# Patient Record
Sex: Male | Born: 1940
Health system: Southern US, Community
[De-identification: ages and names within clinical notes are randomized; demographics above are authoritative.]

## PROBLEM LIST (undated history)

## (undated) DIAGNOSIS — M1990 Unspecified osteoarthritis, unspecified site: Secondary | ICD-10-CM

## (undated) DIAGNOSIS — T8859XA Other complications of anesthesia, initial encounter: Secondary | ICD-10-CM

## (undated) DIAGNOSIS — J449 Chronic obstructive pulmonary disease, unspecified: Secondary | ICD-10-CM

## (undated) DIAGNOSIS — H3530 Unspecified macular degeneration: Secondary | ICD-10-CM

## (undated) DIAGNOSIS — G620 Drug-induced polyneuropathy: Secondary | ICD-10-CM

## (undated) DIAGNOSIS — Z87891 Personal history of nicotine dependence: Secondary | ICD-10-CM

## (undated) DIAGNOSIS — I499 Cardiac arrhythmia, unspecified: Secondary | ICD-10-CM

## (undated) DIAGNOSIS — T4145XA Adverse effect of unspecified anesthetic, initial encounter: Secondary | ICD-10-CM

## (undated) DIAGNOSIS — D649 Anemia, unspecified: Secondary | ICD-10-CM

## (undated) DIAGNOSIS — F1011 Alcohol abuse, in remission: Secondary | ICD-10-CM

## (undated) DIAGNOSIS — N400 Enlarged prostate without lower urinary tract symptoms: Secondary | ICD-10-CM

## (undated) DIAGNOSIS — I2510 Atherosclerotic heart disease of native coronary artery without angina pectoris: Secondary | ICD-10-CM

## (undated) DIAGNOSIS — K219 Gastro-esophageal reflux disease without esophagitis: Secondary | ICD-10-CM

## (undated) DIAGNOSIS — D701 Agranulocytosis secondary to cancer chemotherapy: Secondary | ICD-10-CM

## (undated) DIAGNOSIS — R918 Other nonspecific abnormal finding of lung field: Secondary | ICD-10-CM

## (undated) DIAGNOSIS — D126 Benign neoplasm of colon, unspecified: Secondary | ICD-10-CM

## (undated) DIAGNOSIS — M545 Low back pain, unspecified: Secondary | ICD-10-CM

## (undated) DIAGNOSIS — R7303 Prediabetes: Secondary | ICD-10-CM

## (undated) DIAGNOSIS — H269 Unspecified cataract: Secondary | ICD-10-CM

## (undated) DIAGNOSIS — T451X5A Adverse effect of antineoplastic and immunosuppressive drugs, initial encounter: Secondary | ICD-10-CM

## (undated) DIAGNOSIS — K635 Polyp of colon: Secondary | ICD-10-CM

## (undated) DIAGNOSIS — Z87442 Personal history of urinary calculi: Secondary | ICD-10-CM

## (undated) DIAGNOSIS — G709 Myoneural disorder, unspecified: Secondary | ICD-10-CM

## (undated) DIAGNOSIS — IMO0001 Reserved for inherently not codable concepts without codable children: Secondary | ICD-10-CM

## (undated) DIAGNOSIS — C3490 Malignant neoplasm of unspecified part of unspecified bronchus or lung: Secondary | ICD-10-CM

## (undated) DIAGNOSIS — I10 Essential (primary) hypertension: Secondary | ICD-10-CM

## (undated) DIAGNOSIS — C679 Malignant neoplasm of bladder, unspecified: Secondary | ICD-10-CM

## (undated) DIAGNOSIS — J95811 Postprocedural pneumothorax: Secondary | ICD-10-CM

## (undated) DIAGNOSIS — E119 Type 2 diabetes mellitus without complications: Secondary | ICD-10-CM

## (undated) DIAGNOSIS — E785 Hyperlipidemia, unspecified: Secondary | ICD-10-CM

## (undated) DIAGNOSIS — G250 Essential tremor: Secondary | ICD-10-CM

## (undated) DIAGNOSIS — G8929 Other chronic pain: Secondary | ICD-10-CM

## (undated) HISTORY — DX: Adverse effect of antineoplastic and immunosuppressive drugs, initial encounter: T45.1X5A

## (undated) HISTORY — DX: Other chronic pain: G89.29

## (undated) HISTORY — DX: Malignant neoplasm of unspecified part of unspecified bronchus or lung: C34.90

## (undated) HISTORY — DX: Low back pain, unspecified: M54.50

## (undated) HISTORY — DX: Essential tremor: G25.0

## (undated) HISTORY — DX: Postprocedural pneumothorax: J95.811

## (undated) HISTORY — PX: LUNG BIOPSY: SHX232

## (undated) HISTORY — DX: Personal history of nicotine dependence: Z87.891

## (undated) HISTORY — DX: Benign neoplasm of colon, unspecified: D12.6

## (undated) HISTORY — DX: Agranulocytosis secondary to cancer chemotherapy: D70.1

## (undated) HISTORY — DX: Unspecified cataract: H26.9

## (undated) HISTORY — DX: Malignant neoplasm of bladder, unspecified: C67.9

## (undated) HISTORY — DX: Anemia, unspecified: D64.9

## (undated) HISTORY — DX: Other nonspecific abnormal finding of lung field: R91.8

## (undated) HISTORY — DX: Alcohol abuse, in remission: F10.11

## (undated) HISTORY — DX: Benign prostatic hyperplasia without lower urinary tract symptoms: N40.0

## (undated) HISTORY — DX: Polyp of colon: K63.5

## (undated) HISTORY — DX: Unspecified macular degeneration: H35.30

## (undated) HISTORY — DX: Hyperlipidemia, unspecified: E78.5

## (undated) HISTORY — DX: Drug-induced polyneuropathy: G62.0

## (undated) HISTORY — PX: CATARACT EXTRACTION: SUR2

## (undated) HISTORY — DX: Essential (primary) hypertension: I10

## (undated) HISTORY — PX: POLYPECTOMY: SHX149

## (undated) HISTORY — DX: Low back pain: M54.5

## (undated) HISTORY — DX: Cardiac arrhythmia, unspecified: I49.9

## (undated) SURGERY — Surgical Case
Anesthesia: *Unknown

---

## 1962-06-11 HISTORY — PX: TONSILLECTOMY: SUR1361

## 1980-06-11 HISTORY — PX: HEMORROIDECTOMY: SUR656

## 2010-03-11 DIAGNOSIS — I499 Cardiac arrhythmia, unspecified: Secondary | ICD-10-CM

## 2010-03-11 HISTORY — PX: OTHER SURGICAL HISTORY: SHX169

## 2010-03-11 HISTORY — DX: Cardiac arrhythmia, unspecified: I49.9

## 2010-03-11 HISTORY — PX: A FLUTTER ABLATION: SHX5348

## 2010-03-15 LAB — TSH: TSH: 1.07 u[IU]/mL (ref 0.41–5.90)

## 2010-08-08 LAB — LIPID PANEL
Cholesterol, Total: 166
Direct LDL: 105
HDL: 43 mg/dL (ref 35–70)
Triglyceride fasting, serum: 94

## 2010-08-08 LAB — HEMOGLOBIN A1C: A1c: 6

## 2010-08-08 LAB — COMPREHENSIVE METABOLIC PANEL
AST: 21 U/L
BUN: 25 mg/dL — AB (ref 4–21)
Creat: 0.68
Glucose: 110
Sodium: 144 mmol/L (ref 137–147)

## 2011-04-26 ENCOUNTER — Encounter: Payer: Self-pay | Admitting: Family Medicine

## 2011-04-26 ENCOUNTER — Ambulatory Visit (INDEPENDENT_AMBULATORY_CARE_PROVIDER_SITE_OTHER): Payer: Medicare Other | Admitting: Family Medicine

## 2011-04-26 ENCOUNTER — Telehealth: Payer: Self-pay | Admitting: *Deleted

## 2011-04-26 DIAGNOSIS — Z860100 Personal history of colon polyps, unspecified: Secondary | ICD-10-CM | POA: Insufficient documentation

## 2011-04-26 DIAGNOSIS — IMO0001 Reserved for inherently not codable concepts without codable children: Secondary | ICD-10-CM

## 2011-04-26 DIAGNOSIS — Z125 Encounter for screening for malignant neoplasm of prostate: Secondary | ICD-10-CM

## 2011-04-26 DIAGNOSIS — K635 Polyp of colon: Secondary | ICD-10-CM

## 2011-04-26 DIAGNOSIS — D126 Benign neoplasm of colon, unspecified: Secondary | ICD-10-CM

## 2011-04-26 DIAGNOSIS — I499 Cardiac arrhythmia, unspecified: Secondary | ICD-10-CM

## 2011-04-26 DIAGNOSIS — Z87891 Personal history of nicotine dependence: Secondary | ICD-10-CM | POA: Insufficient documentation

## 2011-04-26 DIAGNOSIS — F17200 Nicotine dependence, unspecified, uncomplicated: Secondary | ICD-10-CM

## 2011-04-26 DIAGNOSIS — F1011 Alcohol abuse, in remission: Secondary | ICD-10-CM

## 2011-04-26 DIAGNOSIS — I10 Essential (primary) hypertension: Secondary | ICD-10-CM

## 2011-04-26 MED ORDER — METOPROLOL TARTRATE 25 MG PO TABS: 25.0000 mg | ORAL_TABLET | Freq: Two times a day (BID) | ORAL | Status: DC

## 2011-04-26 MED ORDER — LISINOPRIL 10 MG PO TABS: 10.0000 mg | ORAL_TABLET | Freq: Every day | ORAL | Status: DC

## 2011-04-26 MED ORDER — METOPROLOL TARTRATE 25 MG PO TABS: 12.5000 mg | ORAL_TABLET | Freq: Two times a day (BID) | ORAL | Status: DC

## 2011-04-26 MED ORDER — VARENICLINE TARTRATE 0.5 MG X 11 & 1 MG X 42 PO MISC: ORAL | Status: AC

## 2011-04-26 MED ORDER — VARENICLINE TARTRATE 1 MG PO TABS: 1.0000 mg | ORAL_TABLET | Freq: Two times a day (BID) | ORAL | Status: AC

## 2011-04-26 NOTE — Assessment & Plan Note (Signed)
See above. Continue meds for now.

## 2011-04-26 NOTE — Patient Instructions (Addendum)
I've sent medicines to pharmacy ([facility]). Start chantix at your convenience - watch out for nausea if that happens let me know.  Look at [email or web address]. I'll await records. Good to meet you today call us with questions. Come back at your convenience - next few months for physical. Come in a few days prior fasting for blood work. We will try lower dose of metoprolol.  Monitor heart rate.

## 2011-04-26 NOTE — Assessment & Plan Note (Signed)
Compliant with meds.  Refilled today. Pt requests change to metoprolol tartrate for cheaper and insurance coverage As HR 60 and pt states runs lower, will decrease metoprolol to 12.5mg  bid. May consider coming off.

## 2011-04-26 NOTE — Progress Notes (Signed)
Subjective:    Patient ID: Joseph Graham, male    DOB: Oct 21, 1940, 70 y.o.   MRN: [id number]  HPI CC: new pt establish  No concerns today.  Recently moved from [place] 2 [place] ago.  H/o cardiac ablation 2011.  Told no blockages.  Wonders if needs B blocker.  Just changed insurance - would like switch from met succ to tartrate.  Smoking - 8 cig/day.  currently using nicontine gum.  Has tried patches in past.  Didn't do well with zyban.  Would like to try chantix.  HTN - compliant with meds.  Didn't do well with zyban.  H/o alcohol abuse, in remission x 30 years.  Avoids narcotis - tends to become dependent easily.  Goes to gym 3x/wk.  Gets winded when exherting himself (ie treadmill).  Pulse does run low.  Preventative: Flu - declines Tetanus - unsure. Pneumonia - 2012 CPE - unsure, blood work 4 mo ago. Colonoscopy - 2005, had rpt scheduled but moved here.  Needs rpt as had polyp. Prostate - always normal.  Occasional frequency.  No nocturia, strong stream. Pt filled ROI form for me to get records.  Medications and allergies reviewed and updated in chart.  Past histories reviewed and updated if relevant as below. There is no problem list on file for this patient.  Past Medical History  Diagnosis Date  . Colon polyps   . HTN (hypertension)   . Alcohol abuse, in remission     remote  . Smoking     8 cig/day   Past Surgical History  Procedure Date  . Tonsillectomy 1964  . Hemorroidectomy 1982  . Cardiac electrophysiology study and ablation 03/2010   History  Substance Use Topics  . Smoking status: Current Everyday Smoker -- 0.5 packs/day    Types: Cigarettes  . Smokeless tobacco: Never Used  . Alcohol Use: No   Family History  Problem Relation Age of Onset  . Cancer Mother 30    ovarian  . Hypertension Father   . Coronary artery disease Neg Hx   . Stroke Neg Hx   . Diabetes Neg Hx   . Parkinsonism Father    No Known Allergies No current outpatient prescriptions on  file prior to visit.   Review of Systems  Constitutional: Negative for fever, chills, activity change, appetite change, fatigue and unexpected weight change.  HENT: Negative for hearing loss and neck pain.   Eyes: Negative for visual disturbance.  Respiratory: Negative for cough, chest tightness, shortness of breath and wheezing.   Cardiovascular: Negative for chest pain, palpitations and leg swelling.  Gastrointestinal: Negative for nausea, vomiting, abdominal pain, diarrhea, constipation, blood in stool and abdominal distention.  Genitourinary: Negative for hematuria and difficulty urinating.  Musculoskeletal: Negative for myalgias and arthralgias.  Skin: Negative for rash.  Neurological: Negative for dizziness, seizures, syncope and headaches.  Hematological: Does not bruise/bleed easily.  Psychiatric/Behavioral: Negative for dysphoric mood. The patient is not nervous/anxious.        Objective:   Physical Exam  Nursing note and vitals reviewed. Constitutional: He is oriented to person, place, and time. He appears well-developed and well-nourished. No distress.  HENT:  Head: Normocephalic and atraumatic.  Right Ear: Hearing, tympanic membrane, external ear and ear canal normal.  Left Ear: Hearing, tympanic membrane, external ear and ear canal normal.  Nose: Nose normal. No mucosal edema or rhinorrhea.  Mouth/Throat: Uvula is midline, oropharynx is clear and moist and mucous membranes are normal. No oropharyngeal exudate, posterior oropharyngeal edema,  posterior oropharyngeal erythema or tonsillar abscesses.  Eyes: Conjunctivae and EOM are normal. Pupils are equal, round, and reactive to light. No scleral icterus.  Neck: Normal range of motion. Neck supple. No thyromegaly present.  Cardiovascular: Normal rate, regular rhythm, normal heart sounds and intact distal pulses.   No murmur heard. Pulses:      Radial pulses are 2+ on the right side, and 2+ on the left side.    Pulmonary/Chest: Effort normal and breath sounds normal. No respiratory distress. He has no wheezes. He has no rales.  Abdominal: Soft. Bowel sounds are normal. He exhibits no distension and no mass. There is no tenderness. There is no rebound and no guarding.  Musculoskeletal: Normal range of motion.  Lymphadenopathy:    He has no cervical adenopathy.  Neurological: He is alert and oriented to person, place, and time.       CN grossly intact, station and gait intact  Skin: Skin is warm and dry. No rash noted.  Psychiatric: He has a normal mood and affect. His behavior is normal. Judgment and thought content normal.      Assessment & Plan:

## 2011-04-26 NOTE — Telephone Encounter (Signed)
Pharmacy notified.

## 2011-04-26 NOTE — Assessment & Plan Note (Signed)
Action phase.   Interested in chantix, prescribed this. discussed common side effects including nausea/vomiting, vivid dreams, aggression/irritation, and cv risk. Discussed set quit date 1 wk into script. Pt will start in next few weeks with wife.   Provided with quitlinenc website.

## 2011-04-26 NOTE — Assessment & Plan Note (Signed)
Stable. Will avoid narcotics as well.

## 2011-04-26 NOTE — Assessment & Plan Note (Addendum)
will need to schedule rpt as due. Return for CPE beginning of next year.  Await prior PCP records.

## 2011-04-26 NOTE — Telephone Encounter (Signed)
Please clarify 12.5mg  bid.

## 2011-04-26 NOTE — Telephone Encounter (Signed)
[facility] that were sent in today.  One was sent in for 25 mg's bid and the other for 25 mg's, one half bid.  Please clarify.

## 2011-05-27 ENCOUNTER — Encounter: Payer: Self-pay | Admitting: Family Medicine

## 2011-06-13 ENCOUNTER — Encounter: Payer: Self-pay | Admitting: Family Medicine

## 2011-07-26 ENCOUNTER — Other Ambulatory Visit (INDEPENDENT_AMBULATORY_CARE_PROVIDER_SITE_OTHER): Payer: Medicare Other

## 2011-07-26 DIAGNOSIS — I10 Essential (primary) hypertension: Secondary | ICD-10-CM

## 2011-07-26 DIAGNOSIS — F1011 Alcohol abuse, in remission: Secondary | ICD-10-CM

## 2011-07-26 DIAGNOSIS — Z125 Encounter for screening for malignant neoplasm of prostate: Secondary | ICD-10-CM

## 2011-07-26 LAB — COMPREHENSIVE METABOLIC PANEL
Albumin: 3.9 g/dL (ref 3.5–5.2)
Alkaline Phosphatase: 81 U/L (ref 39–117)
BUN: 25 mg/dL — ABNORMAL HIGH (ref 6–23)
CO2: 29 mEq/L (ref 19–32)
Calcium: 8.8 mg/dL (ref 8.4–10.5)
Chloride: 103 mEq/L (ref 96–112)
Glucose, Bld: 99 mg/dL (ref 70–99)
Potassium: 4.1 mEq/L (ref 3.5–5.1)
Sodium: 139 mEq/L (ref 135–145)
Total Protein: 7.5 g/dL (ref 6.0–8.3)

## 2011-07-26 LAB — CBC WITH DIFFERENTIAL/PLATELET
Basophils Relative: 0.8 % (ref 0.0–3.0)
Eosinophils Relative: 2.9 % (ref 0.0–5.0)
Lymphocytes Relative: 34.9 % (ref 12.0–46.0)
Monocytes Absolute: 0.6 10*3/uL (ref 0.1–1.0)
Monocytes Relative: 8.9 % (ref 3.0–12.0)
Neutrophils Relative %: 52.5 % (ref 43.0–77.0)
Platelets: 208 10*3/uL (ref 150.0–400.0)
RBC: 4.33 Mil/uL (ref 4.22–5.81)
WBC: 6.8 10*3/uL (ref 4.5–10.5)

## 2011-07-26 LAB — TSH: TSH: 0.78 u[IU]/mL (ref 0.35–5.50)

## 2011-07-26 LAB — LIPID PANEL
Cholesterol: 157 mg/dL (ref 0–200)
LDL Cholesterol: 99 mg/dL (ref 0–99)
Triglycerides: 57 mg/dL (ref 0.0–149.0)

## 2011-07-27 LAB — PSA, MEDICARE: PSA: 0.91 ng/mL (ref <=4.00)

## 2011-07-31 ENCOUNTER — Ambulatory Visit (INDEPENDENT_AMBULATORY_CARE_PROVIDER_SITE_OTHER): Payer: Medicare Other | Admitting: Family Medicine

## 2011-07-31 ENCOUNTER — Encounter: Payer: Self-pay | Admitting: Family Medicine

## 2011-07-31 ENCOUNTER — Encounter: Payer: Self-pay | Admitting: Internal Medicine

## 2011-07-31 VITALS — BP 138/74 | HR 66 | Temp 97.9°F | Ht 69.0 in | Wt 192.2 lb

## 2011-07-31 DIAGNOSIS — G250 Essential tremor: Secondary | ICD-10-CM | POA: Insufficient documentation

## 2011-07-31 DIAGNOSIS — R195 Other fecal abnormalities: Secondary | ICD-10-CM

## 2011-07-31 DIAGNOSIS — Z87891 Personal history of nicotine dependence: Secondary | ICD-10-CM

## 2011-07-31 DIAGNOSIS — Z0000 Encounter for general adult medical examination without abnormal findings: Secondary | ICD-10-CM | POA: Insufficient documentation

## 2011-07-31 DIAGNOSIS — K635 Polyp of colon: Secondary | ICD-10-CM

## 2011-07-31 DIAGNOSIS — Z125 Encounter for screening for malignant neoplasm of prostate: Secondary | ICD-10-CM

## 2011-07-31 DIAGNOSIS — Z1211 Encounter for screening for malignant neoplasm of colon: Secondary | ICD-10-CM

## 2011-07-31 DIAGNOSIS — Z23 Encounter for immunization: Secondary | ICD-10-CM

## 2011-07-31 DIAGNOSIS — D126 Benign neoplasm of colon, unspecified: Secondary | ICD-10-CM

## 2011-07-31 LAB — POC HEMOCCULT BLD/STL (OFFICE/1-CARD/DIAGNOSTIC): Fecal Occult Blood, POC: POSITIVE

## 2011-07-31 NOTE — Assessment & Plan Note (Signed)
Maintenance phase. Congratulated!  Pt wants to come off chantix 2/2 vivid dreams.  Will drop to 1/2 pill bid for 1 wk then stop chantix. If urge returning, will restart (states "i can live with vivid dreams")

## 2011-07-31 NOTE — Progress Notes (Signed)
Subjective:    Patient ID: Joseph Graham, male    DOB: Apr 06, 1941, 71 y.o.   MRN: [id number]  HPI CC:  Medicare wellness visit  Pt presents today for annual exam.   No concerns today. Compliant with meds.  Quit smoking x 5 wks now!!  started chantix several months ago, off cigarettes completely.  Very vivid dreams.  Not using nicotine gum either.  Wants to come off chantix 2/2 impairing restful sleep.  Preventative:  Flu - declines. Tetanus - unsure.  would like today.  Td. Pneumonia - 2012. Shingles - would not like zostavax. States has not had chicken pox Colonoscopy - 2003 or 2005, had rpt scheduled but moved here. Needs rpt as had h/o polyps. Prostate - always normal.  Occasional frequency.  No nocturia, strong stream. No trouble with memory.  No trouble with hearing or vision.  Due for eye exam.  Advised to schedule. Would not want extended life support.  Does want CPR and life support if deemed reversible/temporary.  HCPOA is wife.  No falls in last year.   Denies depression.  No anhedonia.  Caffeine: 1 cup decaf/day Lives with wife Occupation: retired, worked Airline pilot Activity: gym 3x/wk, Publishing rights manager, hobbies (paint, building things) Diet: healthy, good water, fruits/vegetables daily, red meat 1x/wk  Medications and allergies reviewed and updated in chart.  Past histories reviewed and updated if relevant as below. Patient Active Problem List  Diagnoses  . Colon polyps  . HTN (hypertension)  . Alcohol abuse, in remission  . Smoking  . Cardiac arrhythmia   Past Medical History  Diagnosis Date  . Colon polyps     due for colonoscopy, last ?2003  . HTN (hypertension)   . Alcohol abuse, in remission     remote  . Smoking     quit 06/2011  . Cardiac arrhythmia 03/2010    h/o a flutter and CM, s/p ablation, normal stress test  . Benign essential tremor     improved on metoprolol  . Dyslipidemia     mild off meds  . Chronic low back pain     MRI 2004, multilevel DDD   . Prediabetes    Past Surgical History  Procedure Date  . Tonsillectomy 1964  . Hemorroidectomy 1982  . A flutter ablation 03/2010  . Cataract extraction   . Carotid US 03/2010    WNL   History  Substance Use Topics  . Smoking status: Current Everyday Smoker -- 0.5 packs/day    Types: Cigarettes  . Smokeless tobacco: Never Used  . Alcohol Use: No   Family History  Problem Relation Age of Onset  . Cancer Mother 68    ovarian  . Hypertension Father   . Coronary artery disease Neg Hx   . Stroke Neg Hx   . Diabetes Neg Hx   . Parkinsonism Father     and several uncles/aunts   No Known Allergies Current Outpatient Prescriptions on File Prior to Visit  Medication Sig Dispense Refill  . lisinopril (PRINIVIL,ZESTRIL) 10 MG tablet Take 1 tablet (10 mg total) by mouth daily.  90 tablet  3  . metoprolol tartrate (LOPRESSOR) 25 MG tablet Take 0.5 tablets (12.5 mg total) by mouth 2 (two) times daily.  90 tablet  3     Review of Systems  Constitutional: Positive for unexpected weight change (gained 5 lbs since quit smoking). Negative for fever, chills, activity change, appetite change and fatigue.  HENT: Negative for hearing loss and neck pain.  Eyes: Negative for visual disturbance.  Respiratory: Negative for cough, chest tightness, shortness of breath and wheezing.   Cardiovascular: Negative for chest pain, palpitations and leg swelling.  Gastrointestinal: Positive for diarrhea and constipation. Negative for nausea, vomiting, abdominal pain, blood in stool and abdominal distention.  Genitourinary: Negative for hematuria and difficulty urinating.  Musculoskeletal: Negative for myalgias and arthralgias.  Skin: Negative for rash.  Neurological: Negative for dizziness, seizures, syncope and headaches.  Hematological: Does not bruise/bleed easily.  Psychiatric/Behavioral: Negative for dysphoric mood. The patient is not nervous/anxious.        Objective:   Physical Exam    Nursing note and vitals reviewed. Constitutional: He is oriented to person, place, and time. He appears well-developed and well-nourished. No distress.  HENT:  Head: Normocephalic and atraumatic.  Right Ear: Hearing, tympanic membrane, external ear and ear canal normal.  Left Ear: Hearing, tympanic membrane, external ear and ear canal normal.  Nose: Nose normal. No mucosal edema or rhinorrhea.  Mouth/Throat: Uvula is midline, oropharynx is clear and moist and mucous membranes are normal. No oropharyngeal exudate, posterior oropharyngeal edema, posterior oropharyngeal erythema or tonsillar abscesses.       Sl irritation R external canal - wears ear plugs at night 2/2 wife's snoring + PNdrainage.  Eyes: Conjunctivae and EOM are normal. Pupils are equal, round, and reactive to light. No scleral icterus.  Neck: Normal range of motion. Neck supple. No thyromegaly present.  Cardiovascular: Normal rate, regular rhythm, normal heart sounds and intact distal pulses.   No murmur heard. Pulses:      Radial pulses are 2+ on the right side, and 2+ on the left side.  Pulmonary/Chest: Effort normal and breath sounds normal. No respiratory distress. He has no wheezes. He has no rales.  Abdominal: Soft. Bowel sounds are normal. He exhibits no distension and no mass. There is no tenderness. There is no rebound and no guarding.  Genitourinary: Prostate normal. Rectal exam shows no external hemorrhoid, no internal hemorrhoid, no fissure, no mass, no tenderness and anal tone normal. Guaiac positive stool. Prostate is not enlarged and not tender.       30-35gm prostate  Musculoskeletal: Normal range of motion.  Lymphadenopathy:    He has no cervical adenopathy.  Neurological: He is alert and oriented to person, place, and time.       CN grossly intact, station and gait intact  Skin: Skin is warm and dry. No rash noted.  Psychiatric: He has a normal mood and affect. His behavior is normal. Judgment and thought  content normal.      Assessment & Plan:

## 2011-07-31 NOTE — Assessment & Plan Note (Signed)
Hemoccult positive today, h/o polyps. Refer to GI for colonoscopy.

## 2011-07-31 NOTE — Assessment & Plan Note (Addendum)
I have personally reviewed the Medicare Annual Wellness questionnaire and have noted 1. The patient's medical and social history 2. Their use of alcohol, tobacco or illicit drugs 3. Their current medications and supplements 4. The patient's functional ability including ADL's, fall risks, home safety risks and hearing or visual impairment. 5. Diet and physical activities 6. Evidence for depression or mood disorders The patients weight, height, BMI have been recorded in the chart.  Hearing and vision has been addressed. I have made referrals, counseling and provided education to the patient based review of the above and I have provided the pt with a written personalized care plan for preventive services.  Td today. Hemoccult positive today - refer to GI for colonoscopy, due as h/o polyps. PSA/DRE reassuring today.

## 2011-07-31 NOTE — Patient Instructions (Addendum)
Pass by Marion's office to schedule colonoscopy. Cut down on chantix to 1/2 pill twice daily for 1 week then may stop.  If feeling urge returning, let us know. Vision and hearing today. Td today (tetanus). Good to see you today, call us with questions. Return in 1 year or as needed.

## 2011-09-04 ENCOUNTER — Ambulatory Visit (AMBULATORY_SURGERY_CENTER): Payer: Medicare Other | Admitting: *Deleted

## 2011-09-04 ENCOUNTER — Encounter: Payer: Self-pay | Admitting: Internal Medicine

## 2011-09-04 VITALS — Ht 69.0 in | Wt 190.0 lb

## 2011-09-04 DIAGNOSIS — Z1211 Encounter for screening for malignant neoplasm of colon: Secondary | ICD-10-CM

## 2011-09-04 MED ORDER — PEG-KCL-NACL-NASULF-NA ASC-C 100 G PO SOLR: ORAL | Status: DC

## 2011-09-04 NOTE — Progress Notes (Signed)
Pt. States he had a colon in [place] about 7-8 years ago and was told he had a polyp and was to have another colon in 5 years.  He does not remember the name of the doctor.

## 2011-09-10 HISTORY — PX: COLONOSCOPY: SHX174

## 2011-09-17 ENCOUNTER — Other Ambulatory Visit: Payer: Self-pay | Admitting: *Deleted

## 2011-09-17 DIAGNOSIS — Z1211 Encounter for screening for malignant neoplasm of colon: Secondary | ICD-10-CM

## 2011-09-18 ENCOUNTER — Encounter: Payer: Self-pay | Admitting: Internal Medicine

## 2011-09-18 ENCOUNTER — Ambulatory Visit (AMBULATORY_SURGERY_CENTER): Payer: Medicare Other | Admitting: Internal Medicine

## 2011-09-18 VITALS — BP 137/71 | HR 64 | Temp 98.6°F | Resp 20 | Ht 69.0 in | Wt 190.0 lb

## 2011-09-18 DIAGNOSIS — D126 Benign neoplasm of colon, unspecified: Secondary | ICD-10-CM

## 2011-09-18 DIAGNOSIS — D128 Benign neoplasm of rectum: Secondary | ICD-10-CM

## 2011-09-18 DIAGNOSIS — D129 Benign neoplasm of anus and anal canal: Secondary | ICD-10-CM

## 2011-09-18 DIAGNOSIS — Z1211 Encounter for screening for malignant neoplasm of colon: Secondary | ICD-10-CM

## 2011-09-18 DIAGNOSIS — Z86010 Personal history of colonic polyps: Secondary | ICD-10-CM

## 2011-09-18 MED ORDER — SODIUM CHLORIDE 0.9 % IV SOLN: 500.0000 mL | INTRAVENOUS | Status: DC

## 2011-09-18 NOTE — Progress Notes (Signed)
Patient did not experience any of the following events: a burn prior to discharge; a fall within the facility; wrong site/side/patient/procedure/implant event; or a hospital transfer or hospital admission upon discharge from the facility. (G8907) Patient did not have preoperative order for IV antibiotic SSI prophylaxis. (G8918)  

## 2011-09-18 NOTE — Patient Instructions (Addendum)

## 2011-09-18 NOTE — Op Note (Signed)
[facility] [address] [place], [place]  [address]  COLONOSCOPY PROCEDURE REPORT  PATIENT:  Joseph Graham, Joseph Graham  MR#:  [id number] BIRTHDATE:  1940/10/20, 71 yrs. old  GENDER:  male ENDOSCOPIST:  Iva Boop, MD, [facility] REF. BY:  Eustaquio Boyden, M.D. PROCEDURE DATE:  09/18/2011 PROCEDURE:  Colonoscopy with snare polypectomy ASA CLASS:  Class II INDICATIONS:  surveillance and high-risk screening, history of polyps polyp removed 8 years ago - [place] MEDICATIONS:   These medications were titrated to patient response per physician's verbal order, Fentanyl 50 mcg, Versed 6 mg IV  DESCRIPTION OF PROCEDURE:   After the risks benefits and alternatives of the procedure were thoroughly explained, informed consent was obtained.  Digital rectal exam was performed and revealed no abnormalities and normal prostate.   The LB [id number] endoscope was introduced through the anus and advanced to the cecum, which was identified by both the appendix and ileocecal valve, without limitations.  The quality of the prep was excellent, using MoviPrep.  The instrument was then slowly withdrawn as the colon was fully examined. <<PROCEDUREIMAGES>>  FINDINGS:  There were multiple polyps identified and removed. Six (6) polyps removed. 4 mm cecal polyp. 5 diminutive rectal polyps largest 5 mm. All cold snare and sent to pathology.  Mild diverticulosis was found in the sigmoid colon.  This was otherwise a normal examination of the colon. Includes right colon retroflexion.   Retroflexed views in the rectum revealed no abnormalities.    The time to cecum = 1:07 minutes. The scope was then withdrawn in 13:27 minutes from the cecum and the procedure completed. COMPLICATIONS:  None ENDOSCOPIC IMPRESSION: 1) 6 Polyps removed, largest 5 mm 2) Mild diverticulosis in the sigmoid colon 3) Otherwise normal examination, excellent prep 4) Prior colon polyp removal about 8 years ago  REPEAT  EXAM:  In for Colonoscopy, pending biopsy results.  Iva Boop, MD, [name]  CC:  Eustaquio Boyden MD and The Patient  n. eSIGNED:   Iva Boop at 09/18/2011 12:11 PM  Dannette Barbara, [phone]

## 2011-09-19 ENCOUNTER — Telehealth: Payer: Self-pay | Admitting: *Deleted

## 2011-09-19 NOTE — Telephone Encounter (Signed)
  Follow up Call-  Call back number 09/18/2011  Post procedure Call Back phone  # [phone]  Permission to leave phone message Yes     Patient questions:  Do you have a fever, pain , or abdominal swelling? no Pain Score  0 *  Have you tolerated food without any problems? yes  Have you been able to return to your normal activities? yes  Do you have any questions about your discharge instructions: Diet   no Medications  no Follow up visit  no  Do you have questions or concerns about your Care? no  Actions: * If pain score is 4 or above: No action needed, pain <4.

## 2011-09-24 ENCOUNTER — Encounter: Payer: Self-pay | Admitting: Internal Medicine

## 2011-09-24 NOTE — Progress Notes (Signed)
Quick Note:  2 diminutive adenomas Remainder diminutive rectal hyperplastic polyps  Repeat colonoscopy about 09/2016 ______

## 2011-09-26 ENCOUNTER — Encounter: Payer: Self-pay | Admitting: Family Medicine

## 2012-05-06 ENCOUNTER — Other Ambulatory Visit: Payer: Self-pay | Admitting: Family Medicine

## 2012-10-31 ENCOUNTER — Other Ambulatory Visit: Payer: Self-pay | Admitting: Family Medicine

## 2012-12-30 ENCOUNTER — Encounter: Payer: Self-pay | Admitting: Family Medicine

## 2012-12-30 ENCOUNTER — Ambulatory Visit (INDEPENDENT_AMBULATORY_CARE_PROVIDER_SITE_OTHER): Payer: Medicare Other | Admitting: Family Medicine

## 2012-12-30 VITALS — BP 142/82 | HR 72 | Temp 97.9°F | Ht 69.0 in | Wt 195.5 lb

## 2012-12-30 DIAGNOSIS — R7309 Other abnormal glucose: Secondary | ICD-10-CM

## 2012-12-30 DIAGNOSIS — Z87891 Personal history of nicotine dependence: Secondary | ICD-10-CM

## 2012-12-30 DIAGNOSIS — N400 Enlarged prostate without lower urinary tract symptoms: Secondary | ICD-10-CM

## 2012-12-30 DIAGNOSIS — Z0000 Encounter for general adult medical examination without abnormal findings: Secondary | ICD-10-CM

## 2012-12-30 DIAGNOSIS — R739 Hyperglycemia, unspecified: Secondary | ICD-10-CM

## 2012-12-30 DIAGNOSIS — I10 Essential (primary) hypertension: Secondary | ICD-10-CM

## 2012-12-30 DIAGNOSIS — G250 Essential tremor: Secondary | ICD-10-CM

## 2012-12-30 DIAGNOSIS — E1151 Type 2 diabetes mellitus with diabetic peripheral angiopathy without gangrene: Secondary | ICD-10-CM | POA: Insufficient documentation

## 2012-12-30 DIAGNOSIS — E118 Type 2 diabetes mellitus with unspecified complications: Secondary | ICD-10-CM | POA: Insufficient documentation

## 2012-12-30 DIAGNOSIS — Z125 Encounter for screening for malignant neoplasm of prostate: Secondary | ICD-10-CM

## 2012-12-30 HISTORY — DX: Benign prostatic hyperplasia without lower urinary tract symptoms: N40.0

## 2012-12-30 LAB — COMPREHENSIVE METABOLIC PANEL
Alkaline Phosphatase: 105 U/L (ref 39–117)
CO2: 31 mEq/L (ref 19–32)
Creatinine, Ser: 0.7 mg/dL (ref 0.4–1.5)
GFR: 110.35 mL/min (ref >60.00)
Glucose, Bld: 98 mg/dL (ref 70–99)
Total Bilirubin: 0.7 mg/dL (ref 0.3–1.2)

## 2012-12-30 LAB — LIPID PANEL
HDL: 39.3 mg/dL (ref >39.00)
Triglycerides: 111 mg/dL (ref 0.0–149.0)
VLDL: 22.2 mg/dL (ref 0.0–40.0)

## 2012-12-30 LAB — HEMOGLOBIN A1C: Hgb A1c MFr Bld: 6.4 % (ref 4.6–6.5)

## 2012-12-30 LAB — PSA, MEDICARE: PSA: 0.67 ng/ml (ref 0.10–4.00)

## 2012-12-30 MED ORDER — LISINOPRIL 10 MG PO TABS: ORAL_TABLET | ORAL | Status: DC

## 2012-12-30 MED ORDER — GABAPENTIN 100 MG PO CAPS: 100.0000 mg | ORAL_CAPSULE | Freq: Two times a day (BID) | ORAL | Status: DC | PRN

## 2012-12-30 MED ORDER — METOPROLOL TARTRATE 25 MG PO TABS: ORAL_TABLET | ORAL | Status: DC

## 2012-12-30 NOTE — Assessment & Plan Note (Signed)
H/o A1c to 6%.  Recheck today.

## 2012-12-30 NOTE — Assessment & Plan Note (Signed)
Contemplative.  Planning on quitting in August with new prescription for chantix.  Knows to call our office for script.

## 2012-12-30 NOTE — Assessment & Plan Note (Signed)
On exam. Minimal sxs. Continue to monitor.

## 2012-12-30 NOTE — Assessment & Plan Note (Signed)
Only able to tolerate low dose metoprolol 2/2 HR. Will do trial of gabapentin.

## 2012-12-30 NOTE — Assessment & Plan Note (Signed)
I have personally reviewed the Medicare Annual Wellness questionnaire and have noted 1. The patient's medical and social history 2. Their use of alcohol, tobacco or illicit drugs 3. Their current medications and supplements 4. The patient's functional ability including ADL's, fall risks, home safety risks and hearing or visual impairment. 5. Diet and physical activity 6. Evidence for depression or mood disorders The patients weight, height, BMI have been recorded in the chart.  Hearing and vision has been addressed. I have made referrals, counseling and provided education to the patient based review of the above and I have provided the pt with a written personalized care plan for preventive services. See scanned questionairre. Advanced directives discussed: packet of information provided today.  Would want wife to be HCPOA.  Reviewed preventative protocols and updated unless pt declined. Declines zostavax and flu. UTD colonoscopy.  DRE/PSA today.

## 2012-12-30 NOTE — Progress Notes (Signed)
Subjective:    Patient ID: Joseph Graham, male    DOB: [birth year], 72 y.o.   MRN: [id number]  HPI [id number] wellness visit  HR steady 60-65.  States blood pressure at home creeping up slightly.  Noticing worsening tremors recently.  Worse at night time.  Family history of this.  Would like blood work today, fasting for this.  Smoking - restarted 1 year ago.  Using nicotine gum currently.  Down to 1/2 ppd.  Motivated to quit smoking again.  May request chantix in 1 month.  Preventative:  Colonoscopy - 09/2011 polyps, rpt due 5 yrs, mild diverticulosis Leone Payor) Prostate - always normal. Occasional frequency. No nocturia, strong stream.  Flu - declines.  Tdap 07/2011. Pneumonia - 2012. zostavax - declines. Advanced directives: would not want extended life support. Does want CPR and life support if deemed reversible/temporary. HCPOA is wife.  Fell on treadmill 2 mo ago.  No other falls. Denies depression. No anhedonia.   Caffeine: 1 cup decaf/day Lives with wife Occupation: retired, worked Airline pilot Activity: gym 3x/wk, Publishing rights manager, hobbies (paint, building things) Diet: healthy, good water, fruits/vegetables daily, red meat 1x/wk  Medications and allergies reviewed and updated in chart.  Past histories reviewed and updated if relevant as below. Patient Active Problem List   Diagnosis Date Noted  . Medicare annual wellness visit, subsequent 07/31/2011  . Benign essential tremor   . Personal history of colonic polyps - adenomas   . HTN (hypertension)   . Alcohol abuse, in remission   . History of smoking   . Cardiac arrhythmia 03/11/2010   Past Medical History  Diagnosis Date  . Colon polyps     next colonoscopy due around 2018  . HTN (hypertension)   . Alcohol abuse, in remission     remote  . Smoking     quit 06/2011  . Cardiac arrhythmia 03/2010    h/o a flutter and CM, s/p ablation, normal stress test  . Benign essential tremor     improved on metoprolol  .  Dyslipidemia     mild off meds  . Chronic low back pain     MRI 2004, multilevel DDD   Past Surgical History  Procedure Laterality Date  . Tonsillectomy  1964  . Hemorroidectomy  1982  . A flutter ablation  03/2010  . Cataract extraction    . Carotid US  03/2010    WNL  . Colonoscopy  09/2011    polyps, rpt due 5 yrs, mild diverticulosis Leone Payor)   History  Substance Use Topics  . Smoking status: Current Every Day Smoker -- 0.50 packs/day    Types: Cigarettes    Last Attempt to Quit: 06/12/2011  . Smokeless tobacco: Never Used  . Alcohol Use: No   Family History  Problem Relation Age of Onset  . Cancer Mother 19    ovarian  . Hypertension Father   . Coronary artery disease Neg Hx   . Stroke Neg Hx   . Diabetes Neg Hx   . Parkinsonism Father     and several uncles/aunts (not parkinson's)   No Known Allergies Current Outpatient Prescriptions on File Prior to Visit  Medication Sig Dispense Refill  . aspirin EC 81 MG tablet Take 81 mg by mouth daily.      Marland Kitchen ibuprofen (ADVIL,MOTRIN) 200 MG tablet Take 200 mg by mouth every 6 (six) hours as needed.      . Multiple Vitamins-Minerals (MULTIVITAMIN PO) Take 1 tablet by mouth daily.  No current facility-administered medications on file prior to visit.     Review of Systems  Constitutional: Negative for fever, chills, activity change, appetite change, fatigue and unexpected weight change.  HENT: Negative for hearing loss and neck pain.   Eyes: Negative for visual disturbance.  Respiratory: Negative for cough, chest tightness, shortness of breath and wheezing.   Cardiovascular: Negative for chest pain, palpitations and leg swelling.  Gastrointestinal: Negative for nausea, vomiting, abdominal pain, diarrhea, constipation, blood in stool and abdominal distention.  Genitourinary: Negative for hematuria and difficulty urinating.  Musculoskeletal: Negative for myalgias and arthralgias.  Skin: Negative for rash.   Neurological: Negative for dizziness, seizures, syncope and headaches.  Hematological: Negative for adenopathy. Does not bruise/bleed easily.  Psychiatric/Behavioral: Negative for dysphoric mood. The patient is not nervous/anxious.        Objective:   Physical Exam  Nursing note and vitals reviewed. Constitutional: He is oriented to person, place, and time. He appears well-developed and well-nourished. No distress.  HENT:  Head: Normocephalic and atraumatic.  Right Ear: Hearing, tympanic membrane, external ear and ear canal normal.  Left Ear: Hearing, tympanic membrane, external ear and ear canal normal.  Nose: Nose normal.  Mouth/Throat: Uvula is midline, oropharynx is clear and moist and mucous membranes are normal. No oropharyngeal exudate, posterior oropharyngeal edema, posterior oropharyngeal erythema or tonsillar abscesses.  Eyes: Conjunctivae and EOM are normal. Pupils are equal, round, and reactive to light. No scleral icterus.  Neck: Normal range of motion. Neck supple. No thyromegaly present.  Cardiovascular: Normal rate, regular rhythm, normal heart sounds and intact distal pulses.   No murmur heard. Pulses:      Radial pulses are 2+ on the right side, and 2+ on the left side.  Pulmonary/Chest: Effort normal and breath sounds normal. No respiratory distress. He has no wheezes. He has no rales.  Abdominal: Soft. Bowel sounds are normal. He exhibits no distension and no mass. There is no tenderness. There is no rebound and no guarding.  Genitourinary: Rectum normal. Rectal exam shows no external hemorrhoid, no internal hemorrhoid, no fissure, no mass, no tenderness and anal tone normal. Prostate is enlarged (20-25gm). Prostate is not tender.  Musculoskeletal: Normal range of motion. He exhibits no edema.  Lymphadenopathy:    He has no cervical adenopathy.  Neurological: He is alert and oriented to person, place, and time.  CN grossly intact, station and gait intact  Skin:  Skin is warm and dry. No rash noted.  Psychiatric: He has a normal mood and affect. His behavior is normal. Judgment and thought content normal.       Assessment & Plan:

## 2012-12-30 NOTE — Patient Instructions (Addendum)
Let's try gabapentin 100mg  one to two in afternoon/evening.  Let me know how this is going. Blood work today. Advanced directives packet provided today. Good to see you today, call us with questions.

## 2012-12-30 NOTE — Assessment & Plan Note (Signed)
Chronic. Creeping up.  No change today. Consider increased lisinopril dose if persistently uncontrolled.

## 2013-01-01 ENCOUNTER — Encounter: Payer: Self-pay | Admitting: *Deleted

## 2013-01-27 ENCOUNTER — Telehealth: Payer: Self-pay | Admitting: Family Medicine

## 2013-01-27 MED ORDER — VARENICLINE TARTRATE 0.5 MG X 11 & 1 MG X 42 PO MISC: ORAL | Status: DC

## 2013-01-27 MED ORDER — VARENICLINE TARTRATE 1 MG PO TABS: 1.0000 mg | ORAL_TABLET | Freq: Two times a day (BID) | ORAL | Status: DC

## 2013-01-27 NOTE — Telephone Encounter (Signed)
When wife seen - she requests script for husband for chantix. He has taken in past and tolerated well.

## 2013-02-21 ENCOUNTER — Other Ambulatory Visit: Payer: Self-pay | Admitting: Family Medicine

## 2013-04-16 ENCOUNTER — Other Ambulatory Visit: Payer: Self-pay

## 2013-11-16 ENCOUNTER — Other Ambulatory Visit: Payer: Self-pay | Admitting: Family Medicine

## 2013-12-27 ENCOUNTER — Other Ambulatory Visit: Payer: Self-pay | Admitting: Family Medicine

## 2013-12-27 DIAGNOSIS — I10 Essential (primary) hypertension: Secondary | ICD-10-CM

## 2013-12-27 DIAGNOSIS — R739 Hyperglycemia, unspecified: Secondary | ICD-10-CM

## 2013-12-27 DIAGNOSIS — N400 Enlarged prostate without lower urinary tract symptoms: Secondary | ICD-10-CM

## 2013-12-31 ENCOUNTER — Other Ambulatory Visit (INDEPENDENT_AMBULATORY_CARE_PROVIDER_SITE_OTHER): Payer: Medicare Other

## 2013-12-31 ENCOUNTER — Encounter: Payer: Medicare Other | Admitting: Family Medicine

## 2013-12-31 DIAGNOSIS — Z0000 Encounter for general adult medical examination without abnormal findings: Secondary | ICD-10-CM

## 2013-12-31 DIAGNOSIS — R7309 Other abnormal glucose: Secondary | ICD-10-CM

## 2013-12-31 DIAGNOSIS — N400 Enlarged prostate without lower urinary tract symptoms: Secondary | ICD-10-CM

## 2013-12-31 DIAGNOSIS — I10 Essential (primary) hypertension: Secondary | ICD-10-CM

## 2013-12-31 DIAGNOSIS — R739 Hyperglycemia, unspecified: Secondary | ICD-10-CM

## 2013-12-31 LAB — LIPID PANEL
CHOLESTEROL: 173 mg/dL (ref 0–200)
HDL: 37.5 mg/dL — ABNORMAL LOW (ref >39.00)
LDL CALC: 108 mg/dL — AB (ref 0–99)
NonHDL: 135.5
TRIGLYCERIDES: 139 mg/dL (ref 0.0–149.0)
Total CHOL/HDL Ratio: 5
VLDL: 27.8 mg/dL (ref 0.0–40.0)

## 2013-12-31 LAB — CBC WITH DIFFERENTIAL/PLATELET
BASOS ABS: 0 10*3/uL (ref 0.0–0.1)
Basophils Relative: 0.3 % (ref 0.0–3.0)
EOS PCT: 2.7 % (ref 0.0–5.0)
Eosinophils Absolute: 0.2 10*3/uL (ref 0.0–0.7)
HEMATOCRIT: 45.5 % (ref 39.0–52.0)
HEMOGLOBIN: 15.2 g/dL (ref 13.0–17.0)
LYMPHS ABS: 2.4 10*3/uL (ref 0.7–4.0)
LYMPHS PCT: 27.4 % (ref 12.0–46.0)
MCHC: 33.3 g/dL (ref 30.0–36.0)
MCV: 96.1 fl (ref 78.0–100.0)
MONOS PCT: 7.5 % (ref 3.0–12.0)
Monocytes Absolute: 0.7 10*3/uL (ref 0.1–1.0)
Neutro Abs: 5.4 10*3/uL (ref 1.4–7.7)
Neutrophils Relative %: 62.1 % (ref 43.0–77.0)
Platelets: 230 10*3/uL (ref 150.0–400.0)
RBC: 4.73 Mil/uL (ref 4.22–5.81)
RDW: 13.6 % (ref 11.5–15.5)
WBC: 8.7 10*3/uL (ref 4.0–10.5)

## 2013-12-31 LAB — BASIC METABOLIC PANEL
BUN: 30 mg/dL — AB (ref 6–23)
CHLORIDE: 103 meq/L (ref 96–112)
CO2: 31 mEq/L (ref 19–32)
CREATININE: 0.7 mg/dL (ref 0.4–1.5)
Calcium: 9.3 mg/dL (ref 8.4–10.5)
GFR: 110.04 mL/min (ref >60.00)
GLUCOSE: 101 mg/dL — AB (ref 70–99)
Potassium: 4.1 mEq/L (ref 3.5–5.1)
Sodium: 139 mEq/L (ref 135–145)

## 2013-12-31 LAB — TSH: TSH: 0.65 u[IU]/mL (ref 0.35–4.50)

## 2013-12-31 LAB — PSA: PSA: 0.68 ng/mL (ref 0.10–4.00)

## 2013-12-31 LAB — HEMOGLOBIN A1C: HEMOGLOBIN A1C: 6.4 % (ref 4.6–6.5)

## 2014-01-05 ENCOUNTER — Encounter: Payer: Self-pay | Admitting: Family Medicine

## 2014-01-05 ENCOUNTER — Ambulatory Visit (INDEPENDENT_AMBULATORY_CARE_PROVIDER_SITE_OTHER): Payer: Medicare Other | Admitting: Family Medicine

## 2014-01-05 VITALS — BP 138/68 | HR 72 | Temp 97.8°F | Ht 69.0 in | Wt 196.5 lb

## 2014-01-05 DIAGNOSIS — R7303 Prediabetes: Secondary | ICD-10-CM

## 2014-01-05 DIAGNOSIS — Z23 Encounter for immunization: Secondary | ICD-10-CM

## 2014-01-05 DIAGNOSIS — Z87891 Personal history of nicotine dependence: Secondary | ICD-10-CM

## 2014-01-05 DIAGNOSIS — I499 Cardiac arrhythmia, unspecified: Secondary | ICD-10-CM

## 2014-01-05 DIAGNOSIS — Z0000 Encounter for general adult medical examination without abnormal findings: Secondary | ICD-10-CM | POA: Insufficient documentation

## 2014-01-05 DIAGNOSIS — N400 Enlarged prostate without lower urinary tract symptoms: Secondary | ICD-10-CM

## 2014-01-05 DIAGNOSIS — I10 Essential (primary) hypertension: Secondary | ICD-10-CM

## 2014-01-05 DIAGNOSIS — G250 Essential tremor: Secondary | ICD-10-CM

## 2014-01-05 DIAGNOSIS — Z7189 Other specified counseling: Secondary | ICD-10-CM | POA: Insufficient documentation

## 2014-01-05 NOTE — Assessment & Plan Note (Signed)
Reviewed labwork. Encouraged monitoring added sugars and simple carbs in diet.

## 2014-01-05 NOTE — Assessment & Plan Note (Signed)
Advanced directives: would not want extended life support. Does want CPR and life support if deemed reversible/temporary. HCPOA is wife. Living will has not been set up yet.

## 2014-01-05 NOTE — Assessment & Plan Note (Signed)
I have personally reviewed the Medicare Annual Wellness questionnaire and have noted 1. The patient's medical and social history 2. Their use of alcohol, tobacco or illicit drugs 3. Their current medications and supplements 4. The patient's functional ability including ADL's, fall risks, home safety risks and hearing or visual impairment. 5. Diet and physical activity 6. Evidence for depression or mood disorders The patients weight, height, BMI have been recorded in the chart.  Hearing and vision has been addressed. I have made referrals, counseling and provided education to the patient based review of the above and I have provided the pt with a written personalized care plan for preventive services. Provider list updated - see scanned questionairre. Advanced directives discussed: Joseph Graham is wife.  Reviewed preventative protocols and updated unless pt declined.

## 2014-01-05 NOTE — Assessment & Plan Note (Signed)
Chronic, stable off meds.

## 2014-01-05 NOTE — Patient Instructions (Addendum)
Prevnar today. Look into setting up living will or advanced directive Good to see you today, call us with questions. Return as needed or in 1 year for next wellness visit.

## 2014-01-05 NOTE — Assessment & Plan Note (Signed)
Restarted. Discussed reasons to quit.

## 2014-01-05 NOTE — Assessment & Plan Note (Signed)
Continue metoprolol/gabapentin.

## 2014-01-05 NOTE — Addendum Note (Signed)
Addended by: Royann Shivers A on: 01/05/2014 10:30 AM   Modules accepted: Orders

## 2014-01-05 NOTE — Assessment & Plan Note (Signed)
Chronic, stable. Continue current regimen. BP Readings from Last 3 Encounters:  01/05/14 138/68  12/30/12 142/82  09/18/11 137/71

## 2014-01-05 NOTE — Progress Notes (Signed)
BP 138/68  Pulse 72  Temp(Src) 97.8 F (36.6 C) (Oral)  Ht 5\' 9"  (1.753 m)  Wt 196 lb 8 oz (89.132 kg)  BMI 29.00 kg/m2   CC: medicare wellness visit  Subjective:    Patient ID: Joseph Graham, male    DOB: 1941-01-08, 73 y.o.   MRN: [id number]  HPI: Joseph Graham is a 73 y.o. male presenting on 01/05/2014 for Annual Exam   Smoking - 1/2 ppd. chantix prescribed last year - works. Stops smoking but then off chantix craving returns. Brother recently diagnosed with bladder cancer. Pt denies hematuria.  Wt Readings from Last 3 Encounters:  01/05/14 196 lb 8 oz (89.132 kg)  12/30/12 195 lb 8 oz (88.678 kg)  09/18/11 190 lb (86.183 kg)   Body mass index is 29 kg/(m^2).  Hearing/vision screens passed. No falls in last year. Denies depression. No anhedonia.   Preventative: Colonoscopy - 09/2011 polyps, rpt due 5 yrs, mild diverticulosis Joseph Graham)  Prostate - always normal. Occasional frequency. No nocturia, strong stream.  Flu - declines.  Td 07/2011.  Pneumonia - 2012. prevnar - today. zostavax - declines.  Advanced directives: would not want extended life support. Does want CPR and life support if deemed reversible/temporary. HCPOA is wife.   Caffeine: 1 cup decaf/week Lives with wife Occupation: retired, worked Press photographer Activity: gym 3x/wk, Immunologist, hobbies (paint, building things) Diet: healthy, good water, fruits/vegetables daily, red meat 1x/wk   Relevant past medical, surgical, family and social history reviewed and updated as indicated.  Allergies and medications reviewed and updated. Current Outpatient Prescriptions on File Prior to Visit  Medication Sig  . aspirin EC 81 MG tablet Take 81 mg by mouth daily.  Marland Kitchen gabapentin (NEURONTIN) 100 MG capsule TAKE 1 CAPSULE (100 MG TOTAL) BY MOUTH 2 (TWO) TIMES DAILY AS NEEDED (TREMOR).  Marland Kitchen ibuprofen (ADVIL,MOTRIN) 200 MG tablet Take 200 mg by mouth every 6 (six) hours as needed.  Marland Kitchen lisinopril (PRINIVIL,ZESTRIL) 10 MG tablet Take  1 by mouth daily  . metoprolol tartrate (LOPRESSOR) 25 MG tablet Take 1/2 (one half) tablet by mouth twice daily  . Multiple Vitamins-Minerals (MULTIVITAMIN PO) Take 1 tablet by mouth daily.  . CHANTIX STARTING MONTH PAK 0.5 MG X 11 & 1 MG X 42 tablet TAKE AS DIRECTED   No current facility-administered medications on file prior to visit.    Review of Systems  Constitutional: Negative for fever, chills, activity change, appetite change, fatigue and unexpected weight change.  HENT: Negative for hearing loss.   Eyes: Negative for visual disturbance.  Respiratory: Positive for cough (smoker) and wheezing (smoker related). Negative for chest tightness and shortness of breath.   Cardiovascular: Negative for chest pain, palpitations and leg swelling.  Gastrointestinal: Negative for nausea, vomiting, abdominal pain, diarrhea, constipation, blood in stool and abdominal distention.  Genitourinary: Negative for hematuria and difficulty urinating.  Musculoskeletal: Negative for arthralgias, myalgias and neck pain.  Skin: Negative for rash.  Neurological: Negative for dizziness, seizures, syncope and headaches.  Hematological: Negative for adenopathy. Does not bruise/bleed easily.  Psychiatric/Behavioral: Negative for dysphoric mood. The patient is not nervous/anxious.    Per HPI unless specifically indicated above    Objective:    BP 138/68  Pulse 72  Temp(Src) 97.8 F (36.6 C) (Oral)  Ht 5\' 9"  (1.753 m)  Wt 196 lb 8 oz (89.132 kg)  BMI 29.00 kg/m2  Physical Exam  Nursing note and vitals reviewed. Constitutional: He is oriented to person, place, and time. He appears  well-developed and well-nourished. No distress.  HENT:  Head: Normocephalic and atraumatic.  Right Ear: Hearing, tympanic membrane, external ear and ear canal normal.  Left Ear: Hearing, tympanic membrane, external ear and ear canal normal.  Nose: Nose normal.  Mouth/Throat: Uvula is midline, oropharynx is clear and moist and  mucous membranes are normal. No oropharyngeal exudate, posterior oropharyngeal edema or posterior oropharyngeal erythema.  Eyes: Conjunctivae and EOM are normal. Pupils are equal, round, and reactive to light. No scleral icterus.  Neck: Normal range of motion. Neck supple. Carotid bruit is not present. No thyromegaly present.  Cardiovascular: Normal rate, regular rhythm, normal heart sounds and intact distal pulses.   No murmur heard. Pulses:      Radial pulses are 2+ on the right side, and 2+ on the left side.  Pulmonary/Chest: Effort normal and breath sounds normal. No respiratory distress. He has no wheezes. He has no rales.  Abdominal: Soft. Bowel sounds are normal. He exhibits no distension and no mass. There is no tenderness. There is no rebound and no guarding.  Genitourinary: Rectum normal. Rectal exam shows no external hemorrhoid, no internal hemorrhoid, no fissure, no mass, no tenderness and anal tone normal. Prostate is enlarged (20-25gm). Prostate is not tender.  Musculoskeletal: Normal range of motion. He exhibits no edema.  Lymphadenopathy:    He has no cervical adenopathy.  Neurological: He is alert and oriented to person, place, and time.  CN grossly intact, station and gait intact Recall 3/3 Calculation 4/5 serial 7s  Skin: Skin is warm and dry. No rash noted.  Psychiatric: He has a normal mood and affect. His behavior is normal. Judgment and thought content normal.   Results for orders placed in visit on 12/31/13  LIPID PANEL      Result Value Ref Range   Cholesterol 173  0 - 200 mg/dL   Triglycerides 139.0  0.0 - 149.0 mg/dL   HDL 37.50 (*) >39.00 mg/dL   VLDL 27.8  0.0 - 40.0 mg/dL   LDL Cholesterol 108 (*) 0 - 99 mg/dL   Total CHOL/HDL Ratio 5     NonHDL 135.50    TSH      Result Value Ref Range   TSH 0.65  0.35 - 4.50 uIU/mL  HEMOGLOBIN A1C      Result Value Ref Range   Hemoglobin A1C 6.4  4.6 - 6.5 %  CBC WITH DIFFERENTIAL      Result Value Ref Range   WBC  8.7  4.0 - 10.5 K/uL   RBC 4.73  4.22 - 5.81 Mil/uL   Hemoglobin 15.2  13.0 - 17.0 g/dL   HCT 45.5  39.0 - 52.0 %   MCV 96.1  78.0 - 100.0 fl   MCHC 33.3  30.0 - 36.0 g/dL   RDW 13.6  11.5 - 15.5 %   Platelets 230.0  150.0 - 400.0 K/uL   Neutrophils Relative % 62.1  43.0 - 77.0 %   Lymphocytes Relative 27.4  12.0 - 46.0 %   Monocytes Relative 7.5  3.0 - 12.0 %   Eosinophils Relative 2.7  0.0 - 5.0 %   Basophils Relative 0.3  0.0 - 3.0 %   Neutro Abs 5.4  1.4 - 7.7 K/uL   Lymphs Abs 2.4  0.7 - 4.0 K/uL   Monocytes Absolute 0.7  0.1 - 1.0 K/uL   Eosinophils Absolute 0.2  0.0 - 0.7 K/uL   Basophils Absolute 0.0  0.0 - 0.1 K/uL  BASIC METABOLIC PANEL  Result Value Ref Range   Sodium 139  135 - 145 mEq/L   Potassium 4.1  3.5 - 5.1 mEq/L   Chloride 103  96 - 112 mEq/L   CO2 31  19 - 32 mEq/L   Glucose, Bld 101 (*) 70 - 99 mg/dL   BUN 30 (*) 6 - 23 mg/dL   Creatinine, Ser 0.7  0.4 - 1.5 mg/dL   Calcium 9.3  8.4 - 10.5 mg/dL   GFR 110.04  >60.00 mL/min  PSA      Result Value Ref Range   PSA 0.68  0.10 - 4.00 ng/mL      Assessment & Plan:   Problem List Items Addressed This Visit   Smoker     Restarted. Discussed reasons to quit.    Prediabetes     Reviewed labwork. Encouraged monitoring added sugars and simple carbs in diet.    Medicare annual wellness visit, subsequent - Primary     I have personally reviewed the Medicare Annual Wellness questionnaire and have noted 1. The patient's medical and social history 2. Their use of alcohol, tobacco or illicit drugs 3. Their current medications and supplements 4. The patient's functional ability including ADL's, fall risks, home safety risks and hearing or visual impairment. 5. Diet and physical activity 6. Evidence for depression or mood disorders The patients weight, height, BMI have been recorded in the chart.  Hearing and vision has been addressed. I have made referrals, counseling and provided education to the patient  based review of the above and I have provided the pt with a written personalized care plan for preventive services. Provider list updated - see scanned questionairre. Advanced directives discussed: Joseph Graham is wife.  Reviewed preventative protocols and updated unless pt declined.    HTN (hypertension)      Chronic, stable. Continue current regimen. BP Readings from Last 3 Encounters:  01/05/14 138/68  12/30/12 142/82  09/18/11 137/71      Health maintenance examination     Preventative protocols reviewed and updated unless pt declined. Discussed healthy diet and lifestyle. Smoker, fmhx bladder cancer. Pt declines screening UA today.    Cardiac arrhythmia     asxs s/p ablation 2011.    BPH (benign prostatic hypertrophy)     Chronic, stable off meds.    Benign essential tremor     Continue metoprolol/gabapentin.    Advanced care planning/counseling discussion     Advanced directives: would not want extended life support. Does want CPR and life support if deemed reversible/temporary. HCPOA is wife. Living will has not been set up yet.        Follow up plan: Return in about 1 year (around 01/06/2015), or as needed, for medicare wellness visit.

## 2014-01-05 NOTE — Assessment & Plan Note (Signed)
Preventative protocols reviewed and updated unless pt declined. Discussed healthy diet and lifestyle. Smoker, fmhx bladder cancer. Pt declines screening UA today.

## 2014-01-05 NOTE — Assessment & Plan Note (Signed)
asxs s/p ablation 2011.

## 2014-02-17 ENCOUNTER — Encounter: Payer: Self-pay | Admitting: Family Medicine

## 2014-02-17 MED ORDER — LISINOPRIL 10 MG PO TABS: ORAL_TABLET | ORAL | Status: DC

## 2014-02-17 MED ORDER — GABAPENTIN 100 MG PO CAPS: ORAL_CAPSULE | ORAL | Status: DC

## 2014-02-17 MED ORDER — METOPROLOL TARTRATE 25 MG PO TABS: ORAL_TABLET | ORAL | Status: DC

## 2014-06-07 ENCOUNTER — Other Ambulatory Visit: Payer: Self-pay | Admitting: Family Medicine

## 2014-06-11 DIAGNOSIS — C3490 Malignant neoplasm of unspecified part of unspecified bronchus or lung: Secondary | ICD-10-CM

## 2014-06-11 DIAGNOSIS — C679 Malignant neoplasm of bladder, unspecified: Secondary | ICD-10-CM

## 2014-06-11 HISTORY — DX: Malignant neoplasm of unspecified part of unspecified bronchus or lung: C34.90

## 2014-06-11 HISTORY — DX: Malignant neoplasm of bladder, unspecified: C67.9

## 2014-09-02 ENCOUNTER — Other Ambulatory Visit: Payer: Self-pay | Admitting: Family Medicine

## 2014-10-30 ENCOUNTER — Other Ambulatory Visit: Payer: Self-pay | Admitting: Family Medicine

## 2014-11-02 ENCOUNTER — Other Ambulatory Visit: Payer: Self-pay | Admitting: Family Medicine

## 2014-12-27 ENCOUNTER — Other Ambulatory Visit: Payer: Self-pay | Admitting: Family Medicine

## 2014-12-27 NOTE — Telephone Encounter (Signed)
Last filled 11/01/14, ok to refill?

## 2014-12-28 NOTE — Telephone Encounter (Signed)
Sent. Thanks.   

## 2015-01-22 ENCOUNTER — Other Ambulatory Visit: Payer: Self-pay | Admitting: Family Medicine

## 2015-01-22 DIAGNOSIS — I10 Essential (primary) hypertension: Secondary | ICD-10-CM

## 2015-01-22 DIAGNOSIS — N400 Enlarged prostate without lower urinary tract symptoms: Secondary | ICD-10-CM

## 2015-01-22 DIAGNOSIS — R7303 Prediabetes: Secondary | ICD-10-CM

## 2015-01-24 ENCOUNTER — Other Ambulatory Visit (INDEPENDENT_AMBULATORY_CARE_PROVIDER_SITE_OTHER): Payer: Medicare Other

## 2015-01-24 DIAGNOSIS — N400 Enlarged prostate without lower urinary tract symptoms: Secondary | ICD-10-CM | POA: Diagnosis not present

## 2015-01-24 DIAGNOSIS — R7303 Prediabetes: Secondary | ICD-10-CM

## 2015-01-24 DIAGNOSIS — I10 Essential (primary) hypertension: Secondary | ICD-10-CM

## 2015-01-24 DIAGNOSIS — R7309 Other abnormal glucose: Secondary | ICD-10-CM | POA: Diagnosis not present

## 2015-01-24 LAB — LIPID PANEL
CHOL/HDL RATIO: 4
CHOLESTEROL: 179 mg/dL (ref 0–200)
HDL: 40.5 mg/dL (ref >39.00)
LDL Cholesterol: 110 mg/dL — ABNORMAL HIGH (ref 0–99)
NonHDL: 138.7
TRIGLYCERIDES: 145 mg/dL (ref 0.0–149.0)
VLDL: 29 mg/dL (ref 0.0–40.0)

## 2015-01-24 LAB — HEMOGLOBIN A1C: Hgb A1c MFr Bld: 6.3 % (ref 4.6–6.5)

## 2015-01-24 LAB — BASIC METABOLIC PANEL
BUN: 26 mg/dL — ABNORMAL HIGH (ref 6–23)
CO2: 33 meq/L — AB (ref 19–32)
Calcium: 9.1 mg/dL (ref 8.4–10.5)
Chloride: 102 mEq/L (ref 96–112)
Creatinine, Ser: 0.79 mg/dL (ref 0.40–1.50)
GFR: 101.75 mL/min (ref >60.00)
GLUCOSE: 113 mg/dL — AB (ref 70–99)
POTASSIUM: 4.3 meq/L (ref 3.5–5.1)
SODIUM: 140 meq/L (ref 135–145)

## 2015-01-24 LAB — PSA: PSA: 0.6 ng/mL (ref 0.10–4.00)

## 2015-01-31 ENCOUNTER — Ambulatory Visit (INDEPENDENT_AMBULATORY_CARE_PROVIDER_SITE_OTHER): Payer: Medicare Other | Admitting: Family Medicine

## 2015-01-31 ENCOUNTER — Encounter: Payer: Self-pay | Admitting: Family Medicine

## 2015-01-31 VITALS — BP 124/70 | HR 62 | Temp 98.1°F | Ht 67.5 in | Wt 209.5 lb

## 2015-01-31 DIAGNOSIS — Z0000 Encounter for general adult medical examination without abnormal findings: Secondary | ICD-10-CM | POA: Diagnosis not present

## 2015-01-31 DIAGNOSIS — N400 Enlarged prostate without lower urinary tract symptoms: Secondary | ICD-10-CM

## 2015-01-31 DIAGNOSIS — Z7189 Other specified counseling: Secondary | ICD-10-CM

## 2015-01-31 DIAGNOSIS — R7303 Prediabetes: Secondary | ICD-10-CM

## 2015-01-31 DIAGNOSIS — F17200 Nicotine dependence, unspecified, uncomplicated: Secondary | ICD-10-CM

## 2015-01-31 DIAGNOSIS — G250 Essential tremor: Secondary | ICD-10-CM

## 2015-01-31 DIAGNOSIS — I10 Essential (primary) hypertension: Secondary | ICD-10-CM

## 2015-01-31 MED ORDER — GABAPENTIN 100 MG PO CAPS: ORAL_CAPSULE | ORAL | Status: DC

## 2015-01-31 MED ORDER — LISINOPRIL 10 MG PO TABS: 10.0000 mg | ORAL_TABLET | Freq: Every day | ORAL | Status: DC

## 2015-01-31 MED ORDER — METOPROLOL TARTRATE 25 MG PO TABS: 12.5000 mg | ORAL_TABLET | Freq: Two times a day (BID) | ORAL | Status: DC

## 2015-01-31 NOTE — Assessment & Plan Note (Signed)
Preventative protocols reviewed and updated unless pt declined. Discussed healthy diet and lifestyle.  

## 2015-01-31 NOTE — Assessment & Plan Note (Signed)

## 2015-01-31 NOTE — Progress Notes (Signed)
Pre visit review using our clinic review tool, if applicable. No additional management support is needed unless otherwise documented below in the visit note. 

## 2015-01-31 NOTE — Patient Instructions (Addendum)
On lawn days - take only 1/2 lisinopril. Continue metoprolol as up to now.  I've refilled medicines.  We will refer you to lung cancer screening clinic. Advanced directive packet provided today. Good to see you today, call us with questions. Return as needed or in 1 year for next medicare wellness visit Let me know if you want a refill of chantix again.  Health Maintenance A healthy lifestyle and preventative care can promote health and wellness.  Maintain regular health, dental, and eye exams.  Eat a healthy diet. Foods like vegetables, fruits, whole grains, low-fat dairy products, and lean protein foods contain the nutrients you need and are low in calories. Decrease your intake of foods high in solid fats, added sugars, and salt. Get information about a proper diet from your health care provider, if necessary.  Regular physical exercise is one of the most important things you can do for your health. Most adults should get at least 150 minutes of moderate-intensity exercise (any activity that increases your heart rate and causes you to sweat) each week. In addition, most adults need muscle-strengthening exercises on 2 or more days a week.   Maintain a healthy weight. The body mass index (BMI) is a screening tool to identify possible weight problems. It provides an estimate of body fat based on height and weight. Your health care provider can find your BMI and can help you achieve or maintain a healthy weight. For males 20 years and older:  A BMI below 18.5 is considered underweight.  A BMI of 18.5 to 24.9 is normal.  A BMI of 25 to 29.9 is considered overweight.  A BMI of 30 and above is considered obese.  Maintain normal blood lipids and cholesterol by exercising and minimizing your intake of saturated fat. Eat a balanced diet with plenty of fruits and vegetables. Blood tests for lipids and cholesterol should begin at age 42 and be repeated every 5 years. If your lipid or cholesterol  levels are high, you are over age 57, or you are at high risk for heart disease, you may need your cholesterol levels checked more frequently.Ongoing high lipid and cholesterol levels should be treated with medicines if diet and exercise are not working.  If you smoke, find out from your health care provider how to quit. If you do not use tobacco, do not start.  Lung cancer screening is recommended for adults aged 61-80 years who are at high risk for developing lung cancer because of a history of smoking. A yearly low-dose CT scan of the lungs is recommended for people who have at least a 30-pack-year history of smoking and are current smokers or have quit within the past 15 years. A pack year of smoking is smoking an average of 1 pack of cigarettes a day for 1 year (for example, a 30-pack-year history of smoking could mean smoking 1 pack a day for 30 years or 2 packs a day for 15 years). Yearly screening should continue until the smoker has stopped smoking for at least 15 years. Yearly screening should be stopped for people who develop a health problem that would prevent them from having lung cancer treatment.  If you choose to drink alcohol, do not have more than 2 drinks per day. One drink is considered to be 12 oz (360 mL) of beer, 5 oz (150 mL) of wine, or 1.5 oz (45 mL) of liquor.  Avoid the use of street drugs. Do not share needles with anyone. Ask for  help if you need support or instructions about stopping the use of drugs.  High blood pressure causes heart disease and increases the risk of stroke. Blood pressure should be checked at least every 1-2 years. Ongoing high blood pressure should be treated with medicines if weight loss and exercise are not effective.  If you are 53-59 years old, ask your health care provider if you should take aspirin to prevent heart disease.  Diabetes screening involves taking a blood sample to check your fasting blood sugar level. This should be done once every  3 years after age 40 if you are at a normal weight and without risk factors for diabetes. Testing should be considered at a younger age or be carried out more frequently if you are overweight and have at least 1 risk factor for diabetes.  Colorectal cancer can be detected and often prevented. Most routine colorectal cancer screening begins at the age of 7 and continues through age 87. However, your health care provider may recommend screening at an earlier age if you have risk factors for colon cancer. On a yearly basis, your health care provider may provide home test kits to check for hidden blood in the stool. A small camera at the end of a tube may be used to directly examine the colon (sigmoidoscopy or colonoscopy) to detect the earliest forms of colorectal cancer. Talk to your health care provider about this at age 22 when routine screening begins. A direct exam of the colon should be repeated every 5-10 years through age 52, unless early forms of precancerous polyps or small growths are found.  People who are at an increased risk for hepatitis B should be screened for this virus. You are considered at high risk for hepatitis B if:  You were born in a country where hepatitis B occurs often. Talk with your health care provider about which countries are considered high risk.  Your parents were born in a high-risk country and you have not received a shot to protect against hepatitis B (hepatitis B vaccine).  You have HIV or AIDS.  You use needles to inject street drugs.  You live with, or have sex with, someone who has hepatitis B.  You are a man who has sex with other men (MSM).  You get hemodialysis treatment.  You take certain medicines for conditions like cancer, organ transplantation, and autoimmune conditions.  Hepatitis C blood testing is recommended for all people born from [birth year] through 1965 and any individual with known risk factors for hepatitis C.  Healthy men should no longer  receive prostate-specific antigen (PSA) blood tests as part of routine cancer screening. Talk to your health care provider about prostate cancer screening.  Testicular cancer screening is not recommended for adolescents or adult males who have no symptoms. Screening includes self-exam, a health care provider exam, and other screening tests. Consult with your health care provider about any symptoms you have or any concerns you have about testicular cancer.  Practice safe sex. Use condoms and avoid high-risk sexual practices to reduce the spread of sexually transmitted infections (STIs).  You should be screened for STIs, including gonorrhea and chlamydia if:  You are sexually active and are younger than 24 years.  You are older than 24 years, and your health care provider tells you that you are at risk for this type of infection.  Your sexual activity has changed since you were last screened, and you are at an increased risk for chlamydia or gonorrhea.  Ask your health care provider if you are at risk.  If you are at risk of being infected with HIV, it is recommended that you take a prescription medicine daily to prevent HIV infection. This is called pre-exposure prophylaxis (PrEP). You are considered at risk if:  You are a man who has sex with other men (MSM).  You are a heterosexual man who is sexually active with multiple partners.  You take drugs by injection.  You are sexually active with a partner who has HIV.  Talk with your health care provider about whether you are at high risk of being infected with HIV. If you choose to begin PrEP, you should first be tested for HIV. You should then be tested every 3 months for as long as you are taking PrEP.  Use sunscreen. Apply sunscreen liberally and repeatedly throughout the day. You should seek shade when your shadow is shorter than you. Protect yourself by wearing long sleeves, pants, a wide-brimmed hat, and sunglasses year round whenever you  are outdoors.  Tell your health care provider of new moles or changes in moles, especially if there is a change in shape or color. Also, tell your health care provider if a mole is larger than the size of a pencil eraser.  A one-time screening for abdominal aortic aneurysm (AAA) and surgical repair of large AAAs by ultrasound is recommended for men aged 40-75 years who are current or former smokers.  Stay current with your vaccines (immunizations). Document Released: 11/24/2007 Document Revised: 06/02/2013 Document Reviewed: 10/23/2010 [facility] Patient Information 2015 [place], [place]. This information is not intended to replace advice given to you by your health care provider. Make sure you discuss any questions you have with your health care provider.

## 2015-01-31 NOTE — Assessment & Plan Note (Signed)
Chronic, stable. Continue current regimen. Discussed fatigue he feels after mowing lawn - suggested he cut lisinopril in half on lawn days (once a week). Update me with effect.

## 2015-01-31 NOTE — Progress Notes (Signed)
BP 124/70 mmHg  Pulse 62  Temp(Src) 98.1 F (36.7 C) (Oral)  Ht 5' 7.5" (1.715 m)  Wt 209 lb 8 oz (95.029 kg)  BMI 32.31 kg/m2   CC: medicare wellness visit  Subjective:    Patient ID: Joseph Graham, male    DOB: 16-Mar-1941, 74 y.o.   MRN: [id number]  HPI: Joseph Graham is a 74 y.o. male presenting on 01/31/2015 for Annual Exam   13 lb weight gain in last year. Started cutting portion sizes. Continues going to gym 3x/wk, mowing lawn.   Not taking chantix. Smoking 1/2 ppd. Chantix effective.   Hearing/vision screens passed. No falls in last year. Denies depression. No anhedonia.   Preventative: Colonoscopy - 09/2011 polyps, rpt due 5 yrs, mild diverticulosis [name])  Lung cancer screening - 45+ PY hx. Continued smoker, 1/2 ppd currently.  Prostate - mild BPH. PSA always normal. Occasional frequency. No nocturia, strong stream.  Flu - declines.  Td 07/2011.  Pneumonia - 2012. prevnar - 12/2013 zostavax - declines.  Advanced directives: would not want extended life support. Does want CPR and life support if deemed reversible/temporary. HCPOA is wife. Packet provided today. Seat belt use discussed Sunscreen use discussed. No changing moles noted.  Caffeine: 1 cup decaf/week Lives with wife Occupation: retired, worked Press photographer Activity: gym 3x/wk, Immunologist, hobbies (paint, building things) Diet: healthy, good water, fruits/vegetables daily, red meat 1x/wk   Relevant past medical, surgical, family and social history reviewed and updated as indicated. Interim medical history since our last visit reviewed. Allergies and medications reviewed and updated. Current Outpatient Prescriptions on File Prior to Visit  Medication Sig  . aspirin EC 81 MG tablet Take 81 mg by mouth daily.  . Multiple Vitamins-Minerals (MULTIVITAMIN PO) Take 1 tablet by mouth daily.   No current facility-administered medications on file prior to visit.    Review of Systems  Constitutional:  Negative for fever, chills, activity change, appetite change, fatigue and unexpected weight change.  HENT: Negative for hearing loss.   Eyes: Negative for visual disturbance.  Respiratory: Negative for cough, chest tightness, shortness of breath and wheezing.   Cardiovascular: Negative for chest pain, palpitations and leg swelling.  Gastrointestinal: Negative for nausea, vomiting, abdominal pain, diarrhea, constipation, blood in stool and abdominal distention.  Genitourinary: Negative for hematuria and difficulty urinating.  Musculoskeletal: Negative for myalgias, arthralgias and neck pain.  Skin: Negative for rash.  Neurological: Negative for dizziness, seizures, syncope and headaches.  Hematological: Negative for adenopathy. Does not bruise/bleed easily.  Psychiatric/Behavioral: Negative for dysphoric mood. The patient is not nervous/anxious.    Per HPI unless specifically indicated above     Objective:    BP 124/70 mmHg  Pulse 62  Temp(Src) 98.1 F (36.7 C) (Oral)  Ht 5' 7.5" (1.715 m)  Wt 209 lb 8 oz (95.029 kg)  BMI 32.31 kg/m2  Wt Readings from Last 3 Encounters:  01/31/15 209 lb 8 oz (95.029 kg)  01/05/14 196 lb 8 oz (89.132 kg)  12/30/12 195 lb 8 oz (88.678 kg)    Physical Exam  Constitutional: He is oriented to person, place, and time. He appears well-developed and well-nourished. No distress.  HENT:  Head: Normocephalic and atraumatic.  Right Ear: Hearing, tympanic membrane, external ear and ear canal normal.  Left Ear: Hearing, tympanic membrane, external ear and ear canal normal.  Nose: Nose normal.  Mouth/Throat: Uvula is midline, oropharynx is clear and moist and mucous membranes are normal. No oropharyngeal exudate, posterior oropharyngeal  edema or posterior oropharyngeal erythema.  Eyes: Conjunctivae and EOM are normal. Pupils are equal, round, and reactive to light. No scleral icterus.  Neck: Normal range of motion. Neck supple. Carotid bruit is not present. No  thyromegaly present.  Cardiovascular: Normal rate, regular rhythm, normal heart sounds and intact distal pulses.   No murmur heard. Pulses:      Radial pulses are 2+ on the right side, and 2+ on the left side.  Pulmonary/Chest: Effort normal and breath sounds normal. No respiratory distress. He has no wheezes. He has no rales.  coarse  Abdominal: Soft. Bowel sounds are normal. He exhibits no distension and no mass. There is no tenderness. There is no rebound and no guarding.  Genitourinary: Rectum normal and prostate normal. Rectal exam shows no external hemorrhoid, no internal hemorrhoid, no fissure, no mass, no tenderness and anal tone normal. Prostate is not enlarged and not tender.  Musculoskeletal: Normal range of motion. He exhibits no edema.  Lymphadenopathy:    He has no cervical adenopathy.  Neurological: He is alert and oriented to person, place, and time.  CN grossly intact, station and gait intact  Skin: Skin is warm and dry. No rash noted.  Psychiatric: He has a normal mood and affect. His behavior is normal. Judgment and thought content normal.  Nursing note and vitals reviewed.  Results for orders placed or performed in visit on 01/24/15  Lipid panel  Result Value Ref Range   Cholesterol 179 0 - 200 mg/dL   Triglycerides 145.0 0.0 - 149.0 mg/dL   HDL 40.50 >39.00 mg/dL   VLDL 29.0 0.0 - 40.0 mg/dL   LDL Cholesterol 110 (H) 0 - 99 mg/dL   Total CHOL/HDL Ratio 4    NonHDL 138.70   Hemoglobin A1c  Result Value Ref Range   Hgb A1c MFr Bld 6.3 4.6 - 6.5 %  Basic metabolic panel  Result Value Ref Range   Sodium 140 135 - 145 mEq/L   Potassium 4.3 3.5 - 5.1 mEq/L   Chloride 102 96 - 112 mEq/L   CO2 33 (H) 19 - 32 mEq/L   Glucose, Bld 113 (H) 70 - 99 mg/dL   BUN 26 (H) 6 - 23 mg/dL   Creatinine, Ser 0.79 0.40 - 1.50 mg/dL   Calcium 9.1 8.4 - 10.5 mg/dL   GFR 101.75 >60.00 mL/min  PSA  Result Value Ref Range   PSA 0.60 0.10 - 4.00 ng/mL      Assessment & Plan:    Problem List Items Addressed This Visit    HTN (hypertension)    Chronic, stable. Continue current regimen. Discussed fatigue he feels after mowing lawn - suggested he cut lisinopril in half on lawn days (once a week). Update me with effect.      Relevant Medications   lisinopril (PRINIVIL,ZESTRIL) 10 MG tablet   metoprolol tartrate (LOPRESSOR) 25 MG tablet   Smoker    Continued smoker 1/2 ppd. chantix effective but relapses because wife continues to smoke. Encouraged smoking cessation for both he and wife, encouraged he recruit her to quit with him. Discussed lung cancer screening - pt interested. Will refer to lung cancer screening clinic.      Relevant Orders   Ambulatory Referral for Lung Cancer Scre   Medicare annual wellness visit, subsequent - Primary    I have personally reviewed the Medicare Annual Wellness questionnaire and have noted 1. The patient's medical and social history 2. Their use of alcohol, tobacco or illicit drugs  3. Their current medications and supplements 4. The patient's functional ability including ADL's, fall risks, home safety risks and hearing or visual impairment. Cognitive function has been assessed and addressed as indicated.  5. Diet and physical activity 6. Evidence for depression or mood disorders The patients weight, height, BMI have been recorded in the chart. I have made referrals, counseling and provided education to the patient based on review of the above and I have provided the pt with a written personalized care plan for preventive services. Provider list updated.. See scanned questionairre as needed for further documentation. Reviewed preventative protocols and updated unless pt declined.       Benign essential tremor    Reviewed metoprolol/gabapentin dosing.      Prediabetes    Reviewed labwork. Encouraged avoiding added sugars.      BPH (benign prostatic hypertrophy)    Chronic, stable off meds. Mild. PSA stable.       Health maintenance examination    Preventative protocols reviewed and updated unless pt declined. Discussed healthy diet and lifestyle.       Advanced care planning/counseling discussion    Advanced directives: would not want extended life support. Does want CPR and life support if deemed reversible/temporary. HCPOA is wife. Packet provided today.          Follow up plan: No Follow-up on file.

## 2015-01-31 NOTE — Assessment & Plan Note (Signed)
Chronic, stable off meds. Mild. PSA stable.

## 2015-01-31 NOTE — Assessment & Plan Note (Signed)
Reviewed metoprolol/gabapentin dosing.

## 2015-01-31 NOTE — Assessment & Plan Note (Signed)
Reviewed labwork. Encouraged avoiding added sugars.

## 2015-01-31 NOTE — Assessment & Plan Note (Signed)
Continued smoker 1/2 ppd. chantix effective but relapses because wife continues to smoke. Encouraged smoking cessation for both he and wife, encouraged he recruit her to quit with him. Discussed lung cancer screening - pt interested. Will refer to lung cancer screening clinic.

## 2015-01-31 NOTE — Assessment & Plan Note (Signed)
Advanced directives: would not want extended life support. Does want CPR and life support if deemed reversible/temporary. HCPOA is wife. Packet provided today.

## 2015-02-10 DIAGNOSIS — R918 Other nonspecific abnormal finding of lung field: Secondary | ICD-10-CM

## 2015-02-10 HISTORY — DX: Other nonspecific abnormal finding of lung field: R91.8

## 2015-02-18 ENCOUNTER — Encounter: Payer: Self-pay | Admitting: Family Medicine

## 2015-02-18 ENCOUNTER — Ambulatory Visit
Admission: RE | Admit: 2015-02-18 | Discharge: 2015-02-18 | Disposition: A | Discharge status: Home / Self Care | Payer: Medicare Other | Source: Ambulatory Visit | Attending: Family Medicine | Admitting: Family Medicine

## 2015-02-18 ENCOUNTER — Inpatient Hospital Stay: Payer: Medicare Other | Attending: Family Medicine | Admitting: Family Medicine

## 2015-02-18 ENCOUNTER — Other Ambulatory Visit: Payer: Self-pay | Admitting: Family Medicine

## 2015-02-18 DIAGNOSIS — R918 Other nonspecific abnormal finding of lung field: Secondary | ICD-10-CM | POA: Diagnosis not present

## 2015-02-18 DIAGNOSIS — G8929 Other chronic pain: Secondary | ICD-10-CM | POA: Insufficient documentation

## 2015-02-18 DIAGNOSIS — K219 Gastro-esophageal reflux disease without esophagitis: Secondary | ICD-10-CM | POA: Insufficient documentation

## 2015-02-18 DIAGNOSIS — N400 Enlarged prostate without lower urinary tract symptoms: Secondary | ICD-10-CM | POA: Insufficient documentation

## 2015-02-18 DIAGNOSIS — I10 Essential (primary) hypertension: Secondary | ICD-10-CM | POA: Insufficient documentation

## 2015-02-18 DIAGNOSIS — F1010 Alcohol abuse, uncomplicated: Secondary | ICD-10-CM | POA: Insufficient documentation

## 2015-02-18 DIAGNOSIS — Z86010 Personal history of colonic polyps: Secondary | ICD-10-CM | POA: Insufficient documentation

## 2015-02-18 DIAGNOSIS — I2510 Atherosclerotic heart disease of native coronary artery without angina pectoris: Secondary | ICD-10-CM | POA: Diagnosis not present

## 2015-02-18 DIAGNOSIS — F17210 Nicotine dependence, cigarettes, uncomplicated: Secondary | ICD-10-CM | POA: Insufficient documentation

## 2015-02-18 DIAGNOSIS — N329 Bladder disorder, unspecified: Secondary | ICD-10-CM | POA: Insufficient documentation

## 2015-02-18 DIAGNOSIS — J439 Emphysema, unspecified: Secondary | ICD-10-CM | POA: Insufficient documentation

## 2015-02-18 DIAGNOSIS — K760 Fatty (change of) liver, not elsewhere classified: Secondary | ICD-10-CM | POA: Diagnosis not present

## 2015-02-18 DIAGNOSIS — Z87891 Personal history of nicotine dependence: Secondary | ICD-10-CM

## 2015-02-18 DIAGNOSIS — I499 Cardiac arrhythmia, unspecified: Secondary | ICD-10-CM | POA: Insufficient documentation

## 2015-02-18 DIAGNOSIS — Z122 Encounter for screening for malignant neoplasm of respiratory organs: Secondary | ICD-10-CM

## 2015-02-18 DIAGNOSIS — E785 Hyperlipidemia, unspecified: Secondary | ICD-10-CM | POA: Insufficient documentation

## 2015-02-18 DIAGNOSIS — M545 Low back pain: Secondary | ICD-10-CM | POA: Insufficient documentation

## 2015-02-18 DIAGNOSIS — Z79899 Other long term (current) drug therapy: Secondary | ICD-10-CM | POA: Insufficient documentation

## 2015-02-18 DIAGNOSIS — Z7982 Long term (current) use of aspirin: Secondary | ICD-10-CM | POA: Insufficient documentation

## 2015-02-18 DIAGNOSIS — J449 Chronic obstructive pulmonary disease, unspecified: Secondary | ICD-10-CM | POA: Insufficient documentation

## 2015-02-18 HISTORY — DX: Personal history of nicotine dependence: Z87.891

## 2015-02-18 NOTE — Progress Notes (Signed)
In accordance with CMS guidelines, patient has meet eligibility criteria including age, absence of signs or symptoms of lung cancer, the specific calculation of cigarette smoking pack-years was 91.5 years and is a current smoker.   A shared decision-making session was conducted prior to the performance of CT scan. This includes one or more decision aids, includes benefits and harms of screening, follow-up diagnostic testing, over-diagnosis, false positive rate, and total radiation exposure.  Counseling on the importance of adherence to annual lung cancer LDCT screening, impact of co-morbidities, and ability or willingness to undergo diagnosis and treatment is imperative for compliance of the program.  Counseling on the importance of continued smoking cessation for former smokers; the importance of smoking cessation for current smokers and information about tobacco cessation interventions have been given to patient including the [place] [facility], 1800 quit , as well as [place] specific smoking cessation programs.  Written order for lung cancer screening with LDCT has been given to the patient and any and all questions have been answered to the best of my abilities.   Yearly follow up will be scheduled by Burgess Estelle, Thoracic Navigator.

## 2015-02-21 ENCOUNTER — Telehealth: Payer: Self-pay

## 2015-02-21 ENCOUNTER — Telehealth: Payer: Self-pay | Admitting: *Deleted

## 2015-02-21 ENCOUNTER — Encounter: Payer: Self-pay | Admitting: Family Medicine

## 2015-02-21 ENCOUNTER — Telehealth: Payer: Self-pay | Admitting: Family Medicine

## 2015-02-21 NOTE — Telephone Encounter (Signed)
Notified patient of LDCT lung cancer screening results recommending f/u with specialists for highly suspicious abnormality. Prior to contacting patient I have communicated with referring provider and with pulmonary physician. Will proceed with plan for PET scan and ENB biopsy.  Patient is in agreement with plan and verbalizes understanding.

## 2015-02-21 NOTE — Telephone Encounter (Signed)
  Oncology Nurse Navigator Documentation    Navigator Encounter Type: Telephone (02/21/15 1700)         Interventions: Coordination of Care (02/21/15 1700)   Coordination of Care:  (ENB) (02/21/15 1700)        Time Spent with Patient: 15 (02/21/15 1700)   Spoke with Misty at Dr Tyrone Sage office. He can perform ENB 03/02/15 at 1300. Surgery scheduling form with this information faxed to [facility] in the OR

## 2015-02-21 NOTE — Telephone Encounter (Signed)
Spoke with patient regarding lung CT findings - advised we would schedule him with pulm to obtain biopsy. Will route to Burgess Estelle RN to get this set up. Appreciate their assistance.

## 2015-02-22 ENCOUNTER — Other Ambulatory Visit: Payer: Self-pay | Admitting: Family Medicine

## 2015-02-22 ENCOUNTER — Telehealth: Payer: Self-pay

## 2015-02-22 DIAGNOSIS — R918 Other nonspecific abnormal finding of lung field: Secondary | ICD-10-CM

## 2015-02-22 NOTE — Telephone Encounter (Signed)
  Oncology Nurse Navigator Documentation    Navigator Encounter Type: Telephone (02/22/15 1300)         Interventions: Coordination of Care (02/22/15 1300)   Coordination of Care:  (ENB) (02/22/15 1300)        Time Spent with Patient: 15 (02/22/15 1300)   Spoke to Mr Mcmanus on the telephone. Instructed on the following appts. PAT phone interview between 9-12p on 9-16. PET scan 9-20 at 0930. Instructed on non-diabetic prep and also mailed copy to home address. ENB scheduled for 9/21 1300 with arrival time of 1200. Pt aware to remain off all anticoagulants and remain NPO after midnight. Provided with my contact information with any further questions or needs.

## 2015-02-24 ENCOUNTER — Ambulatory Visit: Payer: Self-pay | Admitting: Cardiothoracic Surgery

## 2015-02-24 ENCOUNTER — Telehealth: Payer: Self-pay

## 2015-02-24 NOTE — Telephone Encounter (Signed)
  Oncology Nurse Navigator Documentation    Navigator Encounter Type: Telephone (02/24/15 1000)                      Time Spent with Patient: 15 (02/24/15 1000)   Pt unable to see ENB scheduled in my chart. He wanted to verify the appt date/ time. He was ensured that he is scheduled for 9/21 and is to arrive at 1200 at the medical mall entrance.

## 2015-02-25 ENCOUNTER — Encounter: Payer: Self-pay | Admitting: *Deleted

## 2015-02-25 ENCOUNTER — Other Ambulatory Visit: Payer: Self-pay

## 2015-02-25 NOTE — Patient Instructions (Addendum)
  Your procedure is scheduled on: 03-02-15 Report to [place] To find out your arrival time please call [phone] between 1PM - 3PM on 03-01-15  Remember: Instructions that are not followed completely may result in serious medical risk, up to and including death, or upon the discretion of your surgeon and anesthesiologist your surgery may need to be rescheduled.    _X___ 1. Do not eat food or drink liquids after midnight. No gum chewing or hard candies.     _X___ 2. No Alcohol for 24 hours before or after surgery.   ____ 3. Bring all medications with you on the day of surgery if instructed.    _X___ 4. Notify your doctor if there is any change in your medical condition     (cold, fever, infections).     Do not wear jewelry, make-up, hairpins, clips or nail polish.  Do not wear lotions, powders, or perfumes. You may wear deodorant.  Do not shave 48 hours prior to surgery. Men may shave face and neck.  Do not bring valuables to the hospital.    [facility] is not responsible for any belongings or valuables.               Contacts, dentures or bridgework may not be worn into surgery.  Leave your suitcase in the car. After surgery it may be brought to your room.  For patients admitted to the hospital, discharge time is determined by your treatment team.   Patients discharged the day of surgery will not be allowed to drive home.   Please read over the following fact sheets that you were given:    _X___ Take these medicines the morning of surgery with A SIP OF WATER:    1. METOPROLOL  2. PEPCID  3. TAKE A PEPCID TEUSDAY NIGHT  4.  5.  6.  ____ Fleet Enema (as directed)   ____ Use CHG Soap as directed  ____ Use inhalers on the day of surgery  ____ Stop metformin 2 days prior to surgery    ____ Take 1/2 of usual insulin dose the night before surgery and none on the morning of surgery.   ____ Stop Coumadin/Plavix/aspirin-PT STOPPED ASPIRIN ON  02-18-15  ____ Stop Anti-inflammatories-PT STOPPED NAPROXEN ON 02-18-15-NO NSAIDS OR ASPIRIN PRODUCTS-TYLENOL OK   ____ Stop supplements until after surgery.    ____ Bring C-Pap to the hospital.

## 2015-02-28 ENCOUNTER — Encounter
Admission: RE | Admit: 2015-02-28 | Discharge: 2015-02-28 | Disposition: A | Discharge status: Home / Self Care | Payer: Medicare Other | Source: Ambulatory Visit | Attending: Anesthesiology | Admitting: Anesthesiology

## 2015-02-28 DIAGNOSIS — Z833 Family history of diabetes mellitus: Secondary | ICD-10-CM | POA: Diagnosis not present

## 2015-02-28 DIAGNOSIS — R918 Other nonspecific abnormal finding of lung field: Secondary | ICD-10-CM | POA: Diagnosis not present

## 2015-02-28 DIAGNOSIS — N400 Enlarged prostate without lower urinary tract symptoms: Secondary | ICD-10-CM | POA: Diagnosis not present

## 2015-02-28 DIAGNOSIS — F17200 Nicotine dependence, unspecified, uncomplicated: Secondary | ICD-10-CM | POA: Diagnosis not present

## 2015-02-28 DIAGNOSIS — I4892 Unspecified atrial flutter: Secondary | ICD-10-CM | POA: Diagnosis not present

## 2015-02-28 DIAGNOSIS — R0602 Shortness of breath: Secondary | ICD-10-CM | POA: Diagnosis not present

## 2015-02-28 DIAGNOSIS — E785 Hyperlipidemia, unspecified: Secondary | ICD-10-CM | POA: Diagnosis not present

## 2015-02-28 DIAGNOSIS — G8929 Other chronic pain: Secondary | ICD-10-CM | POA: Diagnosis not present

## 2015-02-28 DIAGNOSIS — Z820 Family history of epilepsy and other diseases of the nervous system: Secondary | ICD-10-CM | POA: Diagnosis not present

## 2015-02-28 DIAGNOSIS — Z8249 Family history of ischemic heart disease and other diseases of the circulatory system: Secondary | ICD-10-CM | POA: Diagnosis not present

## 2015-02-28 DIAGNOSIS — M545 Low back pain: Secondary | ICD-10-CM | POA: Diagnosis not present

## 2015-02-28 DIAGNOSIS — M1990 Unspecified osteoarthritis, unspecified site: Secondary | ICD-10-CM | POA: Diagnosis not present

## 2015-02-28 DIAGNOSIS — Z86010 Personal history of colonic polyps: Secondary | ICD-10-CM | POA: Diagnosis not present

## 2015-02-28 DIAGNOSIS — K219 Gastro-esophageal reflux disease without esophagitis: Secondary | ICD-10-CM | POA: Diagnosis not present

## 2015-02-28 DIAGNOSIS — J449 Chronic obstructive pulmonary disease, unspecified: Secondary | ICD-10-CM | POA: Diagnosis not present

## 2015-02-28 DIAGNOSIS — Z8052 Family history of malignant neoplasm of bladder: Secondary | ICD-10-CM | POA: Diagnosis not present

## 2015-02-28 DIAGNOSIS — G250 Essential tremor: Secondary | ICD-10-CM | POA: Diagnosis not present

## 2015-02-28 DIAGNOSIS — Z823 Family history of stroke: Secondary | ICD-10-CM | POA: Diagnosis not present

## 2015-02-28 NOTE — OR Nursing (Signed)
AS REQUESTED BY DR Delshire FOR CLEARANCE CALLED AND FAXED TO DR Danise Mina. SPOKE WITH CARRIE.ALSO CALLED AND FAXED TO [place] OFFICE

## 2015-03-01 ENCOUNTER — Ambulatory Visit
Admission: RE | Admit: 2015-03-01 | Discharge: 2015-03-01 | Disposition: A | Discharge status: Home / Self Care | Payer: Medicare Other | Source: Ambulatory Visit | Attending: Family Medicine | Admitting: Family Medicine

## 2015-03-01 ENCOUNTER — Ambulatory Visit (INDEPENDENT_AMBULATORY_CARE_PROVIDER_SITE_OTHER): Payer: Medicare Other | Admitting: Cardiovascular Disease

## 2015-03-01 ENCOUNTER — Encounter: Payer: Self-pay | Admitting: *Deleted

## 2015-03-01 ENCOUNTER — Telehealth: Payer: Self-pay | Admitting: *Deleted

## 2015-03-01 ENCOUNTER — Encounter: Payer: Self-pay | Admitting: Cardiovascular Disease

## 2015-03-01 ENCOUNTER — Other Ambulatory Visit: Payer: Self-pay | Admitting: Family Medicine

## 2015-03-01 VITALS — BP 154/72 | HR 61 | Ht 68.0 in | Wt 207.5 lb

## 2015-03-01 DIAGNOSIS — J449 Chronic obstructive pulmonary disease, unspecified: Secondary | ICD-10-CM | POA: Diagnosis not present

## 2015-03-01 DIAGNOSIS — Z823 Family history of stroke: Secondary | ICD-10-CM | POA: Diagnosis not present

## 2015-03-01 DIAGNOSIS — I2510 Atherosclerotic heart disease of native coronary artery without angina pectoris: Secondary | ICD-10-CM

## 2015-03-01 DIAGNOSIS — R918 Other nonspecific abnormal finding of lung field: Secondary | ICD-10-CM

## 2015-03-01 DIAGNOSIS — Z720 Tobacco use: Secondary | ICD-10-CM

## 2015-03-01 DIAGNOSIS — R0602 Shortness of breath: Secondary | ICD-10-CM

## 2015-03-01 DIAGNOSIS — E785 Hyperlipidemia, unspecified: Secondary | ICD-10-CM | POA: Diagnosis not present

## 2015-03-01 DIAGNOSIS — G250 Essential tremor: Secondary | ICD-10-CM | POA: Diagnosis not present

## 2015-03-01 DIAGNOSIS — M545 Low back pain: Secondary | ICD-10-CM | POA: Diagnosis not present

## 2015-03-01 DIAGNOSIS — K219 Gastro-esophageal reflux disease without esophagitis: Secondary | ICD-10-CM | POA: Diagnosis not present

## 2015-03-01 DIAGNOSIS — G8929 Other chronic pain: Secondary | ICD-10-CM | POA: Diagnosis not present

## 2015-03-01 DIAGNOSIS — I739 Peripheral vascular disease, unspecified: Secondary | ICD-10-CM | POA: Insufficient documentation

## 2015-03-01 DIAGNOSIS — Z8052 Family history of malignant neoplasm of bladder: Secondary | ICD-10-CM | POA: Diagnosis not present

## 2015-03-01 DIAGNOSIS — I4892 Unspecified atrial flutter: Secondary | ICD-10-CM | POA: Diagnosis not present

## 2015-03-01 DIAGNOSIS — Z820 Family history of epilepsy and other diseases of the nervous system: Secondary | ICD-10-CM | POA: Diagnosis not present

## 2015-03-01 DIAGNOSIS — F17200 Nicotine dependence, unspecified, uncomplicated: Secondary | ICD-10-CM | POA: Diagnosis not present

## 2015-03-01 DIAGNOSIS — Z86010 Personal history of colonic polyps: Secondary | ICD-10-CM | POA: Diagnosis not present

## 2015-03-01 DIAGNOSIS — I10 Essential (primary) hypertension: Secondary | ICD-10-CM

## 2015-03-01 DIAGNOSIS — N400 Enlarged prostate without lower urinary tract symptoms: Secondary | ICD-10-CM | POA: Diagnosis not present

## 2015-03-01 DIAGNOSIS — Z01818 Encounter for other preprocedural examination: Secondary | ICD-10-CM | POA: Diagnosis not present

## 2015-03-01 DIAGNOSIS — M1990 Unspecified osteoarthritis, unspecified site: Secondary | ICD-10-CM | POA: Diagnosis not present

## 2015-03-01 DIAGNOSIS — Z833 Family history of diabetes mellitus: Secondary | ICD-10-CM | POA: Diagnosis not present

## 2015-03-01 DIAGNOSIS — Z8249 Family history of ischemic heart disease and other diseases of the circulatory system: Secondary | ICD-10-CM | POA: Diagnosis not present

## 2015-03-01 LAB — GLUCOSE, CAPILLARY: GLUCOSE-CAPILLARY: 108 mg/dL — AB (ref 65–99)

## 2015-03-01 MED ORDER — FLUDEOXYGLUCOSE F - 18 (FDG) INJECTION
12.6800 | Freq: Once | INTRAVENOUS | Status: DC | PRN
  Administered 2015-03-01: 12.68 via INTRAVENOUS
  Filled 2015-03-01: qty 12.68

## 2015-03-01 MED ORDER — VARENICLINE TARTRATE 1 MG PO TABS: 1.0000 mg | ORAL_TABLET | Freq: Two times a day (BID) | ORAL | Status: DC

## 2015-03-01 MED ORDER — ROSUVASTATIN CALCIUM 5 MG PO TABS: 5.0000 mg | ORAL_TABLET | Freq: Every day | ORAL | Status: DC

## 2015-03-01 NOTE — Patient Instructions (Addendum)
You are doing well.  Please start crestor 5 mg daily for cholesterol  Start chantix 1/2 pill once a day for a few days  then 1/2 pill twice a day for a week Then full pill twice a day  Please call us if you have new issues that need to be addressed before your next appt.  Your physician wants you to follow-up in: 12 months.  You will receive a reminder letter in the mail two months in advance. If you don't receive a letter, please call our office to schedule the follow-up appointment.

## 2015-03-01 NOTE — Assessment & Plan Note (Signed)
Acceptable risk for bronchoscopy tomorrow. Currently with no symptoms of angina. Asymptomatic coronary artery disease based off CT scan. Likely secondary to long history of smoking

## 2015-03-01 NOTE — Assessment & Plan Note (Signed)
Blood pressure borderline elevated today. Suggested he monitor his pressure at home. Further adjustments to the medications based on his numbers

## 2015-03-01 NOTE — Progress Notes (Signed)
Patient ID: Joseph Graham, male    DOB: April 28, 1941, 74 y.o.   MRN: [id number]  HPI Comments:  Joseph Graham is a 74 year old gentleman with remote history of atrial flutter in September 2011, status post ablation (was in [place] at the time), long history of smoking since age 44, COPD, chronic mild shortness of breath with exertion, recent diagnosis of lung mass on CT scan, who presents for preoperative evaluation prior to bronchoscopy. PET scan completed today for staging, possible cancer. CT scan documenting three-vessel CAD as well as aortic atherosclerosis.  In general he reports that he feels well. He is active, does most of his long work, [employer], cooking housework. Denies having any symptoms concerning for angina. If he does get hot or short of breath, he takes a rest that he does not feel it's is abnormal based on his activities. Denies any significant cough, change in breathing pattern, PND or orthopnea, no lower extremity edema. He denies any palpitations concerning for arrhythmia past 5 years He reports cholesterol has not been a problem He reports having borderline diabetes secondary to weight gain over the past several years Total cholesterol 170. 3 years ago was 150  CT scan images reviewed with him showing mild coronary calcifications in the RCA, left main, LAD, left circumflex. Again mild speckled atherosclerosis in the aortic arch, descending aorta.  Recent EKG 02/28/2015 showing normal sinus rhythm, I bundle branch block, left axis deviation  No Known Allergies  Current Outpatient Prescriptions on File Prior to Visit  Medication Sig Dispense Refill  . aspirin EC 81 MG tablet Take 81 mg by mouth daily.    . famotidine (PEPCID) 10 MG tablet Take 10 mg by mouth as needed for heartburn or indigestion.    . gabapentin (NEURONTIN) 100 MG capsule TAKE 1 CAPSULE BY MOUTH TWICE A DAY AS NEEDED (Patient taking differently: Take 100 mg by mouth 2 (two) times daily. TAKE  1 CAPSULE BY MOUTH TWICE A DAY) 180 capsule 3  . lisinopril (PRINIVIL,ZESTRIL) 10 MG tablet Take 1 tablet (10 mg total) by mouth daily. (Patient taking differently: Take 10 mg by mouth every evening. ) 90 tablet 3  . metoprolol tartrate (LOPRESSOR) 25 MG tablet Take 0.5 tablets (12.5 mg total) by mouth 2 (two) times daily. 90 tablet 3  . Multiple Vitamins-Minerals (MULTIVITAMIN PO) Take 1 tablet by mouth daily.    . Naproxen Sodium (ALEVE) 220 MG CAPS Take by mouth 2 (two) times daily.     Current Facility-Administered Medications on File Prior to Visit  Medication Dose Route Frequency Provider Last Rate Last Dose  . fludeoxyglucose F - 18 (FDG) injection 12.68 milli Curie  12.68 milli Curie Intravenous Once PRN Medication Radiologist, MD   12.68 milli Curie at 03/01/15 4696    Past Medical History  Diagnosis Date  . Colon polyps     next colonoscopy due around 2018  . HTN (hypertension)   . Alcohol abuse, in remission     remote  . Cardiac arrhythmia 03/2010    h/o a flutter and CM, s/p ablation, normal stress test  . Benign essential tremor     improved on metoprolol and gabapentin  . Dyslipidemia     mild off meds  . Chronic low back pain     MRI 2004, multilevel DDD  . BPH (benign prostatic hypertrophy) 12/30/2012  . Personal history of tobacco use, presenting hazards to health 02/18/2015    quit 06/2011, restarted 2014  . Multiple pulmonary  nodules 02/2015  . COPD (chronic obstructive pulmonary disease)   . GERD (gastroesophageal reflux disease)   . Arthritis     BACK AND NECK  . Complication of anesthesia     DID NOT GET COMPLETELY NUMB WITH TONSILLECTOMY  . Shortness of breath dyspnea     OCC WITH EXERTION    Past Surgical History  Procedure Laterality Date  . Hemorroidectomy  1982  . A flutter ablation  03/2010  . Cataract extraction    . Carotid US  03/2010    WNL  . Colonoscopy  09/2011    polyps, rpt due 5 yrs, mild diverticulosis [name])  . Tonsillectomy  1964     Social History  reports that he has been smoking Cigarettes.  He has a 30.5 pack-year smoking history. He has never used smokeless tobacco. He reports that he does not drink alcohol or use illicit drugs.  Family History family history includes Cancer (age of onset: 63) in his brother and mother; Hypertension in his father; Parkinsonism in his father; Stroke (age of onset: 14) in his brother; Tremor in his father. There is no history of Coronary artery disease or Diabetes.    Review of Systems  Constitutional: Negative.   HENT: Negative.   Respiratory: Positive for shortness of breath.   Cardiovascular: Negative.   Gastrointestinal: Negative.   Musculoskeletal: Negative.   Neurological: Negative.   Hematological: Negative.   Psychiatric/Behavioral: Negative.   All other systems reviewed and are negative.  BP 154/72 mmHg  Pulse 61  Ht '5\' 8"'$  (1.727 m)  Wt 207 lb 8 oz (94.121 kg)  BMI 31.56 kg/m2  Physical Exam  Constitutional: He is oriented to person, place, and time. He appears well-developed and well-nourished.  HENT:  Head: Normocephalic.  Nose: Nose normal.  Mouth/Throat: Oropharynx is clear and moist.  Eyes: Conjunctivae are normal. Pupils are equal, round, and reactive to light.  Neck: Normal range of motion. Neck supple. No JVD present.  Cardiovascular: Normal rate, regular rhythm, normal heart sounds and intact distal pulses.  Exam reveals no gallop and no friction rub.   No murmur heard. Pulmonary/Chest: Effort normal and breath sounds normal. No respiratory distress. He has no wheezes. He has no rales. He exhibits no tenderness.  Abdominal: Soft. Bowel sounds are normal. He exhibits no distension. There is no tenderness.  Musculoskeletal: Normal range of motion. He exhibits no edema or tenderness.  Lymphadenopathy:    He has no cervical adenopathy.  Neurological: He is alert and oriented to person, place, and time. Coordination normal.  Skin: Skin is warm and  dry. No rash noted. No erythema.  Psychiatric: He has a normal mood and affect. His behavior is normal. Judgment and thought content normal.

## 2015-03-01 NOTE — Assessment & Plan Note (Signed)
Mild plaquing noted in his aorta, arch and descending We'll recommend aggressive cholesterol control, smoking cessation

## 2015-03-01 NOTE — Assessment & Plan Note (Signed)
Possible lung cancer. Bronchoscopy scheduled tomorrow PET scan performed today. Results not available

## 2015-03-01 NOTE — Telephone Encounter (Signed)
Contacted patient and reviewed plan of care to include, per Dr. Merian Capron request, case to be discussed at weekly conference this week, and patient to see medical oncologist and thoracic surgeon Monday 03/07/15. Patient is in agreement with this plan. Will attempt to obtain pft's prior to these appointments.

## 2015-03-01 NOTE — Progress Notes (Signed)
  Oncology Nurse Navigator Documentation    Navigator Encounter Type: Other (03/01/15 0900)   Treatment Phase: Abnormal Scans (03/01/15 0900)     Interventions: Coordination of Care (03/01/15 0900)            Time Spent with Patient: 30 (03/01/15 0900)   Met with patient at pet scan appointment. Introduced Programmer, multimedia and reviewed plan of care including upcoming cardiac clearance today and enb planned for tomorrow. Reviewed CT imaging with patient. Will follow.

## 2015-03-01 NOTE — Assessment & Plan Note (Signed)
Mild diffuse coronary atherosclerosis, aortic atherosclerosis Recommended smoking cessation, Chantix prescription provided We'll start Crestor 5 mill grams daily, goal LDL less than 70 Currently with no symptoms of angina. No stress testing at this time.

## 2015-03-01 NOTE — Assessment & Plan Note (Signed)
Smoking cessation discussed with him. He will start Chantix Prescription provided

## 2015-03-02 ENCOUNTER — Ambulatory Visit: Payer: Medicare Other | Admitting: Anesthesiology

## 2015-03-02 ENCOUNTER — Ambulatory Visit: Payer: Medicare Other

## 2015-03-02 ENCOUNTER — Ambulatory Visit
Admission: RE | Admit: 2015-03-02 | Discharge: 2015-03-02 | Disposition: A | Discharge status: Home / Self Care | Payer: Medicare Other | Source: Ambulatory Visit | Attending: Internal Medicine | Admitting: Internal Medicine

## 2015-03-02 ENCOUNTER — Encounter
Admission: RE | Disposition: A | Discharge status: Home / Self Care | Payer: Self-pay | Source: Ambulatory Visit | Attending: Internal Medicine

## 2015-03-02 DIAGNOSIS — R911 Solitary pulmonary nodule: Secondary | ICD-10-CM

## 2015-03-02 DIAGNOSIS — Z8052 Family history of malignant neoplasm of bladder: Secondary | ICD-10-CM | POA: Diagnosis not present

## 2015-03-02 DIAGNOSIS — M1990 Unspecified osteoarthritis, unspecified site: Secondary | ICD-10-CM | POA: Insufficient documentation

## 2015-03-02 DIAGNOSIS — Z823 Family history of stroke: Secondary | ICD-10-CM | POA: Diagnosis not present

## 2015-03-02 DIAGNOSIS — Z820 Family history of epilepsy and other diseases of the nervous system: Secondary | ICD-10-CM | POA: Insufficient documentation

## 2015-03-02 DIAGNOSIS — R918 Other nonspecific abnormal finding of lung field: Secondary | ICD-10-CM | POA: Insufficient documentation

## 2015-03-02 DIAGNOSIS — Z833 Family history of diabetes mellitus: Secondary | ICD-10-CM | POA: Insufficient documentation

## 2015-03-02 DIAGNOSIS — R0602 Shortness of breath: Secondary | ICD-10-CM | POA: Diagnosis not present

## 2015-03-02 DIAGNOSIS — J449 Chronic obstructive pulmonary disease, unspecified: Secondary | ICD-10-CM | POA: Diagnosis not present

## 2015-03-02 DIAGNOSIS — Z86010 Personal history of colonic polyps: Secondary | ICD-10-CM | POA: Insufficient documentation

## 2015-03-02 DIAGNOSIS — I4892 Unspecified atrial flutter: Secondary | ICD-10-CM | POA: Insufficient documentation

## 2015-03-02 DIAGNOSIS — N400 Enlarged prostate without lower urinary tract symptoms: Secondary | ICD-10-CM | POA: Insufficient documentation

## 2015-03-02 DIAGNOSIS — G250 Essential tremor: Secondary | ICD-10-CM | POA: Insufficient documentation

## 2015-03-02 DIAGNOSIS — G8929 Other chronic pain: Secondary | ICD-10-CM | POA: Diagnosis not present

## 2015-03-02 DIAGNOSIS — K219 Gastro-esophageal reflux disease without esophagitis: Secondary | ICD-10-CM | POA: Insufficient documentation

## 2015-03-02 DIAGNOSIS — M545 Low back pain: Secondary | ICD-10-CM | POA: Diagnosis not present

## 2015-03-02 DIAGNOSIS — Z8249 Family history of ischemic heart disease and other diseases of the circulatory system: Secondary | ICD-10-CM | POA: Diagnosis not present

## 2015-03-02 DIAGNOSIS — F17200 Nicotine dependence, unspecified, uncomplicated: Secondary | ICD-10-CM | POA: Diagnosis not present

## 2015-03-02 DIAGNOSIS — Z9889 Other specified postprocedural states: Secondary | ICD-10-CM | POA: Diagnosis not present

## 2015-03-02 DIAGNOSIS — E785 Hyperlipidemia, unspecified: Secondary | ICD-10-CM | POA: Insufficient documentation

## 2015-03-02 DIAGNOSIS — I2510 Atherosclerotic heart disease of native coronary artery without angina pectoris: Secondary | ICD-10-CM | POA: Diagnosis not present

## 2015-03-02 HISTORY — DX: Adverse effect of unspecified anesthetic, initial encounter: T41.45XA

## 2015-03-02 HISTORY — DX: Gastro-esophageal reflux disease without esophagitis: K21.9

## 2015-03-02 HISTORY — DX: Reserved for inherently not codable concepts without codable children: IMO0001

## 2015-03-02 HISTORY — DX: Chronic obstructive pulmonary disease, unspecified: J44.9

## 2015-03-02 HISTORY — DX: Unspecified osteoarthritis, unspecified site: M19.90

## 2015-03-02 HISTORY — DX: Other complications of anesthesia, initial encounter: T88.59XA

## 2015-03-02 HISTORY — PX: ELECTROMAGNETIC NAVIGATION BROCHOSCOPY: SHX5369

## 2015-03-02 SURGERY — ELECTROMAGNETIC NAVIGATION BRONCHOSCOPY
Anesthesia: General

## 2015-03-02 MED ORDER — PHENYLEPHRINE HCL 10 MG/ML IJ SOLN
INTRAMUSCULAR | Status: DC | PRN
  Administered 2015-03-02: 50 ug via INTRAVENOUS
  Administered 2015-03-02: 100 ug via INTRAVENOUS

## 2015-03-02 MED ORDER — IPRATROPIUM-ALBUTEROL 0.5-2.5 (3) MG/3ML IN SOLN
RESPIRATORY_TRACT | Status: AC
  Filled 2015-03-02: qty 6

## 2015-03-02 MED ORDER — LIDOCAINE HCL (CARDIAC) 20 MG/ML IV SOLN
INTRAVENOUS | Status: DC | PRN
  Administered 2015-03-02: 40 mg via INTRAVENOUS

## 2015-03-02 MED ORDER — ONDANSETRON HCL 4 MG/2ML IJ SOLN
INTRAMUSCULAR | Status: DC | PRN
  Administered 2015-03-02: 4 mg via INTRAVENOUS

## 2015-03-02 MED ORDER — IPRATROPIUM-ALBUTEROL 0.5-2.5 (3) MG/3ML IN SOLN
3.0000 mL | Freq: Once | RESPIRATORY_TRACT | Status: AC
  Administered 2015-03-02: 3 mL via RESPIRATORY_TRACT

## 2015-03-02 MED ORDER — ONDANSETRON HCL 4 MG/2ML IJ SOLN: 4.0000 mg | Freq: Once | INTRAMUSCULAR | Status: DC | PRN

## 2015-03-02 MED ORDER — LACTATED RINGERS IV SOLN
INTRAVENOUS | Status: DC
  Administered 2015-03-02: 13:00:00 via INTRAVENOUS

## 2015-03-02 MED ORDER — FENTANYL CITRATE (PF) 100 MCG/2ML IJ SOLN
INTRAMUSCULAR | Status: DC | PRN
  Administered 2015-03-02 (×2): 50 ug via INTRAVENOUS

## 2015-03-02 MED ORDER — SUGAMMADEX SODIUM 200 MG/2ML IV SOLN
INTRAVENOUS | Status: DC | PRN
  Administered 2015-03-02: 186 mg via INTRAVENOUS

## 2015-03-02 MED ORDER — PROPOFOL 10 MG/ML IV BOLUS
INTRAVENOUS | Status: DC | PRN
  Administered 2015-03-02: 160 mg via INTRAVENOUS

## 2015-03-02 MED ORDER — MIDAZOLAM HCL 2 MG/2ML IJ SOLN
INTRAMUSCULAR | Status: DC | PRN
  Administered 2015-03-02: 2 mg via INTRAVENOUS

## 2015-03-02 MED ORDER — IPRATROPIUM-ALBUTEROL 0.5-2.5 (3) MG/3ML IN SOLN
RESPIRATORY_TRACT | Status: AC
  Filled 2015-03-02: qty 3

## 2015-03-02 MED ORDER — DEXAMETHASONE SODIUM PHOSPHATE 4 MG/ML IJ SOLN
INTRAMUSCULAR | Status: DC | PRN
  Administered 2015-03-02: 10 mg via INTRAVENOUS

## 2015-03-02 MED ORDER — ROCURONIUM BROMIDE 100 MG/10ML IV SOLN
INTRAVENOUS | Status: DC | PRN
  Administered 2015-03-02: 30 mg via INTRAVENOUS
  Administered 2015-03-02: 10 mg via INTRAVENOUS
  Administered 2015-03-02: 20 mg via INTRAVENOUS
  Administered 2015-03-02: 10 mg via INTRAVENOUS

## 2015-03-02 MED ORDER — EPHEDRINE SULFATE 50 MG/ML IJ SOLN
INTRAMUSCULAR | Status: DC | PRN
  Administered 2015-03-02: 10 mg via INTRAVENOUS

## 2015-03-02 MED ORDER — FENTANYL CITRATE (PF) 100 MCG/2ML IJ SOLN: 25.0000 ug | INTRAMUSCULAR | Status: DC | PRN

## 2015-03-02 MED ORDER — PROPOFOL INFUSION 10 MG/ML OPTIME
INTRAVENOUS | Status: DC | PRN
  Administered 2015-03-02: 160 ug/kg/min via INTRAVENOUS

## 2015-03-02 NOTE — Anesthesia Preprocedure Evaluation (Signed)
Anesthesia Evaluation  Patient identified by MRN, date of birth, ID band Patient awake    Reviewed: Allergy & Precautions, NPO status , Patient's Chart, lab work & pertinent test results  History of Anesthesia Complications (+) history of anesthetic complications (tosillectomy under local caused increased pain)  Airway Mallampati: III   Neck ROM: Full    Dental  (+) Teeth Intact   Pulmonary COPD, Current Smoker (1/2 ppd),           Cardiovascular hypertension, Pt. on medications and Pt. on home beta blockers + CAD and + Peripheral Vascular Disease  + dysrhythmias (s/p ablation) Atrial Fibrillation      Neuro/Psych    GI/Hepatic Neg liver ROS, GERD  Medicated,  Endo/Other  negative endocrine ROS  Renal/GU negative Renal ROS     Musculoskeletal   Abdominal   Peds  Hematology   Anesthesia Other Findings   Reproductive/Obstetrics                             Anesthesia Physical Anesthesia Plan  ASA: III  Anesthesia Plan: General   Post-op Pain Management:    Induction: Intravenous  Airway Management Planned: Oral ETT  Additional Equipment:   Intra-op Plan:   Post-operative Plan:   Informed Consent: I have reviewed the patients History and Physical, chart, labs and discussed the procedure including the risks, benefits and alternatives for the proposed anesthesia with the patient or authorized representative who has indicated his/her understanding and acceptance.     Plan Discussed with:   Anesthesia Plan Comments:         Anesthesia Quick Evaluation

## 2015-03-02 NOTE — Transfer of Care (Signed)
Immediate Anesthesia Transfer of Care Note  Patient: Joseph Graham  Procedure(s) Performed: Procedure(s): ELECTROMAGNETIC NAVIGATION BRONCHOSCOPY (N/A)  Patient Location: PACU  Anesthesia Type:General  Level of Consciousness: awake, alert  and oriented  Airway & Oxygen Therapy: Patient Spontanous Breathing and Patient connected to face mask oxygen  Post-op Assessment: Report given to RN and Post -op Vital signs reviewed and stable  Post vital signs: Reviewed and stable  Last Vitals:  Filed Vitals:   03/02/15 1530  BP: 116/57  Pulse: 63  Temp: 36.3 C  Resp: 15    Complications: No apparent anesthesia complications

## 2015-03-02 NOTE — Procedures (Signed)
Date: 03/02/2015, '@TIME'$     PHYSICIAN:  Vishal Mungal  Indications/Preliminary Diagnosis:   Consent: (Place X beside choice/s below)  The benefits, risks and possible complications of the procedure were        explained to:  _x__ patient  ___ patient's family  ___ other:___________  who verbalized understanding and gave:  ___ verbal  ___ written  _x__ verbal and written  ___ telephone  ___ other:________ consent.      Unable to obtain consent; procedure performed on emergent basis.     Other:       PRESEDATION ASSESSMENT: History and Physical has been performed. Patient meds and allergies have been reviewed. Presedation airway examination has been performed and documented. Baseline vital signs, sedation score, oxygenation status, and cardiac rhythm were reviewed. Patient was deemed to be in satisfactory condition to undergo the procedure.  PREMEDICATIONS:   Sedative/Narcotic Amt Dose   Versed  mg   Fentanyl  mcg  Diprivan  mg        Airway Prep (Place X beside choice below)   1% Transtracheal Lidocaine Anesthetization 7 cc   Patient prepped per Bronchoscopy Lab Policy       Insertion Route (Place X beside choice below)   Nasal   Oral   Endotracheal Tube   Tracheostomy   INTRAPROCEDURE MEDICATIONS:General Anesthesia with - propofol, fentanyl performed by Anesthesia  Sedative/Narcotic Amt Dose   Versed  mg   Fentanyl  mcg  Diprivan  mg       Medication Amt Dose  Medication Amt Dose  Xylocaine 2%  cc  Epinephrine 1:10,000 sol  cc  Xylocaine 4%  cc  Cocaine  cc   TECHNICAL PROCEDURES: (Place X beside choice below)   Procedures  Description    None     Electrocautery     Cryotherapy     Balloon Dilatation     Bronchography     Stent Placement   x  Therapeutic Aspiration     Laser/Argon Plasma    Brachytherapy Catheter Placement    Foreign Body Removal     SPECIMENS (Sites): (Place X beside choice below)  Specimens Description   No Specimens Obtained     Washings    Lavage    Biopsies   x Fine Needle Aspirates   x Brushings    Sputum    FINDINGS: normal airways on the right and left - clear, thick secretion noted throughout.   ESTIMATED BLOOD LOSS: <9GE  COMPLICATIONS/RESOLUTION:   PROCEDURE DETAILS: Timeout performed and correct patient, name, & ID confirmed. Following prep per Pulmonary policy, appropriate sedation was administered.  Airway exam proceeded with findings, technical procedures, and specimen collection as noted below. At the end of exam the scope was withdrawn without incident. Impression and Plan as noted below.   Inspection: Right Side - no endobronchial lesion, mild, thick clear secretions Left Side - no endobronchial lesion, mild, thick clear secretions  Procedure:Brochoscopy FOB, followed by ENB After inspection, attention was turned to the navigational portion of the procedure. The patient was already set on the ENB board and pre-procedure planning had been performed using an appropriate chest CT imaging set. Good concordance was noted between software, imaging and patient. Navigation was undertaken to the lesion in the RUL without difficulty. Fiducial placement x 3 of RUL lesion (inferior lesion)  FNA of RUL- triple needle and 19 gauge Brushing of RUL    IMPLANTED DEVICE(S):fiducial markers of inferior RUL lesion x 3  IMPRESSION:POST-PROCEDURE DX: normal,  awaiting official radiology read.  RECOMMENDATION/PLAN: follow up with Oncology and CT Surgery, case will be presented at [facility].     ADDITIONAL COMMENTS:none   Procedure Time: Start Time: 13:50 End Time: 15:05   Joseph Boehringer, MD [facility] Pager : [phone] (Please enter 7 digits)

## 2015-03-02 NOTE — Anesthesia Postprocedure Evaluation (Signed)
  Anesthesia Post-op Note  Patient: Joseph Graham  Procedure(s) Performed: Procedure(s): ELECTROMAGNETIC NAVIGATION BRONCHOSCOPY (N/A)  Anesthesia type:General  Patient location: PACU  Post pain: Pain level controlled  Post assessment: Post-op Vital signs reviewed, Patient's Cardiovascular Status Stable, Respiratory Function Stable, Patent Airway and No signs of Nausea or vomiting  Post vital signs: Reviewed and stable  Last Vitals:  Filed Vitals:   03/02/15 1640  BP: 123/45  Pulse: 70  Temp:   Resp: 16    Level of consciousness: awake, alert  and patient cooperative  Complications: No apparent anesthesia complications

## 2015-03-02 NOTE — Discharge Instructions (Signed)
°  Flexible Bronchoscopy, Care After  Refer to this sheet in the next few weeks. These instructions provide you with information on caring for yourself after your procedure. Your health care provider may also give you more specific instructions. Your treatment has been planned according to current medical practices, but problems sometimes occur. Call your health care provider if you have any problems or questions after your procedure.   WHAT TO EXPECT AFTER THE PROCEDURE It is normal to have the following symptoms for 24-48 hours after the procedure:   Increased cough.  Low-grade fever.  Sore throat or hoarse voice.  Small streaks of blood in your thick spit (sputum) if tissue samples were taken (biopsy). HOME CARE INSTRUCTIONS   Do not eat or drink anything for 2 hours after your procedure. Your nose and throat were numbed by medicine. If you try to eat or drink before the medicine wears off, food or drink could go into your lungs or you could burn yourself. After the numbness is gone and your cough and gag reflexes have returned, you may eat soft food and drink liquids slowly.   The day after the procedure, you can go back to your normal diet.   You may resume normal activities.   Keep all follow-up visits as directed by your health care provider. It is important to keep all your appointments, especially if tissue samples were taken for testing (biopsy). SEEK IMMEDIATE MEDICAL CARE IF:   You have increasing shortness of breath.   You become light-headed or faint.   You have chest pain.   You have any new concerning symptoms.  You cough up more than a small amount of blood.  The amount of blood you cough up increases. MAKE SURE YOU:  Understand these instructions.  Will watch your condition.  Will get help right away if you are not doing well or get worse. Document Released: 12/15/2004 Document Revised: 10/12/2013 Document Reviewed: 01/30/2013 [facility] Patient  Information 2015 [place], [place]. This information is not intended to replace advice given to you by your health care provider. Make sure you discuss any questions you have with your health care provider.   AMBULATORY SURGERY  DISCHARGE INSTRUCTIONS  1) The drugs that you were given will stay in your system until tomorrow so for the next 24 hours you should not: A) Drive an automobile B) Make any legal decisions C) Drink any alcoholic beverage  2) You may resume regular meals tomorrow.  Today it is better to start with liquids and gradually work up to solid foods. You may eat anything you prefer, but it is better to start with liquids, then soup and crackers, and gradually work up to solid foods.  3) Please notify your doctor immediately if you have any unusual bleeding, trouble breathing, redness and pain at the surgery site, drainage, fever, or pain not relieved by medication.  4) Additional Instructions:  Please contact your physician with any problems or Same Day Surgery at [phone], Monday through Friday 6 am to 4 pm, or  at [facility] number at [phone].

## 2015-03-02 NOTE — OR Nursing (Signed)
Patient alert and awake on arrival to pacu, given duoneb treatment per order of Dr. Stevenson Clinch.

## 2015-03-02 NOTE — H&P (Signed)
[facility] Consultation    Date: 03/02/2015  MRN# [id number] Joseph Graham 1940/07/31  Referring Physician: Dr. Danise Mina PMD - Dr. Lorin Mercy is a 74 y.o. old male seen for lung nodules  CC: lung nodules, right upper lobe  HPI:  Joseph Graham is a 74 year old gentleman with remote history of atrial flutter, tobacco abuse half pack per day, that presents for further evaluation of right upper lobe lung nodules. His primary care physician performed low-dose CT scan of the chest for lung cancer screening given his age, tobacco use, and other risk factors. Subsequent low-dose CT scan showed right upper lobe nodules, this was further evaluated with PET CT, that showed hypermetabolic activity in the 2 nodules of the right upper lobe. He endorses some mild shortness of breath with exertion, and no other symptoms of cough or sputum production or significant weight loss. In preop testing he had an abnormality on his EKG, he was further evaluated by cardiology and has been cleared for bronchoscopy from a cardiology standpoint. 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  Mild sob with climbing stairs.    PMHX:   Past Medical History  Diagnosis Date  . Colon polyps     next colonoscopy due around 2018  . HTN (hypertension)   . Alcohol abuse, in remission     remote  . Cardiac arrhythmia 03/2010    h/o a flutter and CM, s/p ablation, normal stress test  . Benign essential tremor     improved on metoprolol and gabapentin  . Dyslipidemia     mild off meds  . Chronic low back pain     MRI 2004, multilevel DDD  . BPH (benign prostatic hypertrophy) 12/30/2012  . Personal history of tobacco use, presenting hazards to health 02/18/2015    quit 06/2011, restarted 2014  . Multiple pulmonary nodules 02/2015  . COPD (chronic obstructive pulmonary disease)   . GERD (gastroesophageal reflux disease)   . Arthritis     BACK AND NECK  . Complication of  anesthesia     DID NOT GET COMPLETELY NUMB WITH TONSILLECTOMY  . Shortness of breath dyspnea     OCC WITH EXERTION   Surgical Hx:  Past Surgical History  Procedure Laterality Date  . Hemorroidectomy  1982  . A flutter ablation  03/2010  . Cataract extraction    . Carotid US  03/2010    WNL  . Colonoscopy  09/2011    polyps, rpt due 5 yrs, mild diverticulosis [name])  . Tonsillectomy  1964   Family Hx:  Family History  Problem Relation Age of Onset  . Cancer Mother 24    ovarian  . Hypertension Father   . Coronary artery disease Neg Hx   . Stroke Brother 24  . Diabetes Neg Hx   . Parkinsonism Father     and several uncles/aunts (not parkinson's)  . Cancer Brother 2    bladder  . Tremor Father    Social Hx:   Social History  Substance Use Topics  . Smoking status: Current Every Day Smoker -- 0.50 packs/day for 61 years    Types: Cigarettes  . Smokeless tobacco: Never Used  . Alcohol Use: No   Medication:   No current outpatient prescriptions on file.    Allergies:  Review of patient's allergies indicates no known allergies.  ROS Review of Systems  Constitutional: Negative.  HENT: Negative.  Respiratory: Positive for shortness of breath.  Cardiovascular: Negative.  Gastrointestinal: Negative.  Musculoskeletal: Negative.  Neurological: Negative.  Hematological: Negative.  Psychiatric/Behavioral: Negative.  All other systems reviewed and are negative.  Physical Examination:   VS: BP 153/60 mmHg  Pulse 61  Temp(Src) 98.5 F (36.9 C) (Tympanic)  Resp 18  Ht '5\' 8"'$  (1.727 m)  Wt 205 lb (92.987 kg)  BMI 31.18 kg/m2  SpO2 98%  Constitutional: He is oriented to person, place, and time. He appears well-developed and well-nourished.  HENT:  Head: Normocephalic.  Nose: Nose normal.  Mouth/Throat: Oropharynx is clear and moist.  Eyes: Conjunctivae are normal. Pupils are equal, round, and reactive to light.  Neck: Normal range of motion. Neck  supple. No JVD present.  Cardiovascular: Normal rate, regular rhythm, normal heart sounds and intact distal pulses. Exam reveals no gallop and no friction rub.  No murmur heard. Pulmonary/Chest: Effort normal and breath sounds normal. No respiratory distress. He has no wheezes. He has no rales. He exhibits no tenderness.  Abdominal: Soft. Bowel sounds are normal. He exhibits no distension. There is no tenderness.  Musculoskeletal: Normal range of motion. He exhibits no edema or tenderness.  Lymphadenopathy:   He has no cervical adenopathy.  Neurological: He is alert and oriented to person, place, and time. Coordination normal.  Skin: Skin is warm and dry. No rash noted. No erythema.  Psychiatric: He has a normal mood and affect. His behavior is normal. Judgment and thought content normal.      Rad results: (The following images and results were reviewed by Dr. Stevenson Clinch). PET CT 03/01/15 EXAM: NUCLEAR MEDICINE PET SKULL BASE TO THIGH  TECHNIQUE: 12.68 mCi F-18 FDG was injected intravenously. Full-ring PET imaging was performed from the skull base to thigh after the radiotracer. CT data was obtained and used for attenuation correction and anatomic localization.  FASTING BLOOD GLUCOSE: Value: 108 mg/dl  COMPARISON: Screening chest CT 02/18/2015  FINDINGS: NECK  No hypermetabolic cervical lymph nodes are identified.Activity within [facility] ring is nearly symmetric and within physiologic limits. No focal lesions of the pharyngeal mucosal space identified.  CHEST  There are no hypermetabolic mediastinal, hilar or axillary lymph nodes. The 2 dominant spiculated right upper lobe nodules are both hypermetabolic. The more superior lesion measures 19 x 11 mm on image 72 and has an SUV max of 6.2. The more inferior lesion measures 24 x 17 mm on image 80 and has an SUV max of 14.6. Both of these lesions are highly concerning for bronchogenic carcinoma. There are additional  subcentimeter right lung nodules which are too small to evaluate with PET-CT and were better seen on recent screening study. The largest of these are in the right middle lobe (image 116) and in the right lower lobe (image 124). No suspicious nodule seen on the left. Emphysema, atherosclerosis and aberrant right subclavian artery noted.  ABDOMEN/PELVIS  There is no hypermetabolic activity within the liver, adrenal glands, spleen or pancreas. There is no hypermetabolic nodal activity. Hepatic steatosis, sigmoid diverticulosis and mild enlargement of the prostate gland are noted. There is asymmetric increased soft tissue density within the posterior right bladder lumen on image 241, without asymmetry of the metabolic activity in the urine.  SKELETON  There is no hypermetabolic activity to suggest osseous metastatic disease. A sebaceous cyst is noted in the posterior right back.  IMPRESSION: 1. Both dominant spiculated right upper lobe nodules are hypermetabolic, most concerning for synchronous primary bronchogenic carcinoma. 2. No evidence of metastatic disease. 3. There are additional smaller right lung nodules which  are too small to characterize with PET-CT. 4. Potential lesion involving the right side of the bladder concerning for possible transitional cell carcinoma. There is no corresponding photopenic defect as would be expected with blood clot or atypical superior extension of prostate tissue. Correlation with urine cytology recommended, and cystoscopy should be considered.     Assessment and Plan:74 yo smoker with RUL nodules, suspicious for malignancy Multiple lung nodules Right upper lobe pulmonary nodules. Given the size, shape, and other cracked her 6 of the lesion, there is suspicious for malignancy. Original nodules found on low-dose CT screening for lung cancer given patient age, smoking history, and other risk factors. Subsequent PET/CT shows hypermetabolic  activity in both nodules.  Plan: Bronchoscopy, ENB, with biopsy for further evaluation of these nodules. Possible fiducial placement.     Orders for this visit: Orders Placed This Encounter  Procedures  . Diet NPO time specified    Standing Status: Standing     Number of Occurrences: 1     Standing Expiration Date:   . Diet NPO time specified    Standing Status: Standing     Number of Occurrences: 1     Standing Expiration Date:   Marland Kitchen Vital signs    Standing Status: Standing     Number of Occurrences: 1     Standing Expiration Date:   . Clear liquids allowed up to 6 hours prior to arrvial to Hospital (OUTPATIENTS)    Clear liquids allowed up to 4 hours prior to surgery.    Standing Status: Standing     Number of Occurrences: 1     Standing Expiration Date:   . Take all oral medications on usual schedule by mouth with sip of water, with the following exceptions:Anticoagulants, Diuretics, ACE inhibitors, angiotensin receptor blockers, Herbal medications/supplements, NSAIDS, MAO inhibitors    **Stop NSAIDS and herbal medications per [facility] protocol. **Stop Metformin for 48 hours prior to surgery. **Use inhalers morning of surgery. **Use half normal night time insulin dose night prior to surgery. No insulin morning of surgery.    Standing Status: Standing     Number of Occurrences: 1     Standing Expiration Date:   . Patient to continue home beta blocker as scheduled; if administered in the hospital, hold for HR <60 bpm    If not taken prior to surgery.    Standing Status: Standing     Number of Occurrences: 1     Standing Expiration Date:   . Discharge instructions    Give patient/families discharge instruction brochure or individual surgeons' instructions to read PREOP.    Standing Status: Standing     Number of Occurrences: 1     Standing Expiration Date:   . Void    On call to O.R.    Standing Status: Standing     Number of Occurrences: 1     Standing Expiration Date:     Marland Kitchen May use Lidocaine 1% - 2% with or without bicarb 8.4 % subcutaneously prior to IV start if patient not allergic to Lidocaine    Standing Status: Standing     Number of Occurrences: 20     Standing Expiration Date:   Marland Kitchen Vital signs    Standing Status: Standing     Number of Occurrences: 1     Standing Expiration Date:   Marland Kitchen Verify informed consent    Standing Status: Standing     Number of Occurrences: 1     Standing Expiration Date:   .  Vital signs per Moderate Sedation protocol    Standing Status: Standing     Number of Occurrences: 1     Standing Expiration Date:   . Obtain Consent    Standing Status: Standing     Number of Occurrences: 1     Standing Expiration Date:     Order Specific Question:  Procedure    Answer:  Bronchoscopy - ENB    Order Specific Question:  Surgeon    Answer:  Dr. [name]    Order Specific Question:  Indication/Reason    Answer:  Lung nodules.  . Oxygen therapy Mode or (Route): Nasal cannula    Titrate as needed.  For all sedated patients    Standing Status: Standing     Number of Occurrences: 20     Standing Expiration Date:     Order Specific Question:  Mode or (Route)    Answer:  Nasal cannula  . Oxygen therapy Mode or (Route): Nasal cannula; Liters Per Minute: 4; Keep 02 saturation: 92% or greater    Standing Status: Standing     Number of Occurrences: 20     Standing Expiration Date:     Order Specific Question:  Mode or (Route)    Answer:  Nasal cannula    Order Specific Question:  Liters Per Minute    Answer:  4    Order Specific Question:  Keep 02 saturation    Answer:  92% or greater  . Insert and maintain IV line    Nurse to order flush per facility specific protocol.    Standing Status: Standing     Number of Occurrences: 1     Standing Expiration Date:   . Insert and maintain IV line    Nurse to order flush per facility specific protocol.    Standing Status: Standing     Number of Occurrences: 1     Standing Expiration  Date:   . Discharge to home when patient meets Pre-Procedural Steward Scale    Standing Status: Standing     Number of Occurrences: 1     Standing Expiration Date:      Thank  you for the consultation and for allowing [facility] to assist in the care of your patient. Our recommendations are noted above.  Please contact us if we can be of further service.   [name], MD  Pulmonary and Critical Care Office Number: [phone]

## 2015-03-02 NOTE — OR Nursing (Signed)
Dr Stevenson Clinch in to see patient and wife. Explained procedure with risks and benefits and patient consented.

## 2015-03-02 NOTE — Assessment & Plan Note (Addendum)
Right upper lobe pulmonary nodules. Given the size, shape, and other cracked her 6 of the lesion, there is suspicious for malignancy. Original nodules found on low-dose CT screening for lung cancer given patient age, smoking history, and other risk factors. Subsequent PET/CT shows hypermetabolic activity in both nodules.  Plan: Bronchoscopy, ENB, with biopsy for further evaluation of these nodules. Possible fiducial placement.

## 2015-03-03 LAB — CYTOLOGY - NON PAP

## 2015-03-04 ENCOUNTER — Telehealth: Payer: Self-pay | Admitting: *Deleted

## 2015-03-04 ENCOUNTER — Encounter: Payer: Self-pay | Admitting: Family Medicine

## 2015-03-04 NOTE — Telephone Encounter (Signed)
Reviewed plan of care and upcoming appointments.

## 2015-03-07 ENCOUNTER — Ambulatory Visit (INDEPENDENT_AMBULATORY_CARE_PROVIDER_SITE_OTHER): Payer: Medicare Other | Admitting: Internal Medicine

## 2015-03-07 ENCOUNTER — Inpatient Hospital Stay (HOSPITAL_BASED_OUTPATIENT_CLINIC_OR_DEPARTMENT_OTHER): Payer: Medicare Other | Admitting: Cardiothoracic Surgery

## 2015-03-07 ENCOUNTER — Inpatient Hospital Stay (HOSPITAL_BASED_OUTPATIENT_CLINIC_OR_DEPARTMENT_OTHER): Payer: Medicare Other | Admitting: Internal Medicine

## 2015-03-07 ENCOUNTER — Encounter: Payer: Self-pay | Admitting: Internal Medicine

## 2015-03-07 VITALS — BP 138/98 | HR 75 | Temp 98.0°F | Resp 18 | Ht 68.0 in | Wt 205.9 lb

## 2015-03-07 DIAGNOSIS — I10 Essential (primary) hypertension: Secondary | ICD-10-CM

## 2015-03-07 DIAGNOSIS — R918 Other nonspecific abnormal finding of lung field: Secondary | ICD-10-CM

## 2015-03-07 DIAGNOSIS — R911 Solitary pulmonary nodule: Secondary | ICD-10-CM

## 2015-03-07 DIAGNOSIS — Z7982 Long term (current) use of aspirin: Secondary | ICD-10-CM | POA: Diagnosis not present

## 2015-03-07 DIAGNOSIS — N3289 Other specified disorders of bladder: Secondary | ICD-10-CM

## 2015-03-07 DIAGNOSIS — I499 Cardiac arrhythmia, unspecified: Secondary | ICD-10-CM

## 2015-03-07 DIAGNOSIS — F1010 Alcohol abuse, uncomplicated: Secondary | ICD-10-CM

## 2015-03-07 DIAGNOSIS — Z79899 Other long term (current) drug therapy: Secondary | ICD-10-CM | POA: Diagnosis not present

## 2015-03-07 DIAGNOSIS — N329 Bladder disorder, unspecified: Secondary | ICD-10-CM

## 2015-03-07 DIAGNOSIS — K219 Gastro-esophageal reflux disease without esophagitis: Secondary | ICD-10-CM | POA: Diagnosis not present

## 2015-03-07 DIAGNOSIS — J449 Chronic obstructive pulmonary disease, unspecified: Secondary | ICD-10-CM

## 2015-03-07 DIAGNOSIS — G8929 Other chronic pain: Secondary | ICD-10-CM | POA: Diagnosis not present

## 2015-03-07 DIAGNOSIS — N400 Enlarged prostate without lower urinary tract symptoms: Secondary | ICD-10-CM

## 2015-03-07 DIAGNOSIS — Z86010 Personal history of colonic polyps: Secondary | ICD-10-CM | POA: Diagnosis not present

## 2015-03-07 DIAGNOSIS — E785 Hyperlipidemia, unspecified: Secondary | ICD-10-CM | POA: Diagnosis not present

## 2015-03-07 DIAGNOSIS — F17210 Nicotine dependence, cigarettes, uncomplicated: Secondary | ICD-10-CM

## 2015-03-07 DIAGNOSIS — M545 Low back pain: Secondary | ICD-10-CM | POA: Diagnosis not present

## 2015-03-07 LAB — PULMONARY FUNCTION TEST
DL/VA % PRED: 87 %
DL/VA: 3.89 ml/min/mmHg/L
DLCO UNC: 20.2 ml/min/mmHg
DLCO unc % pred: 68 %
FEF 25-75 POST: 0.75 L/s
FEF 25-75 Pre: 0.61 L/sec
FEF2575-%Change-Post: 22 %
FEF2575-%PRED-POST: 36 %
FEF2575-%PRED-PRE: 29 %
FEV1-%Change-Post: 8 %
FEV1-%Pred-Post: 43 %
FEV1-%Pred-Pre: 39 %
FEV1-Post: 1.22 L
FEV1-Pre: 1.13 L
FEV1FVC-%CHANGE-POST: -2 %
FEV1FVC-%PRED-PRE: 81 %
FEV6-%Change-Post: 11 %
FEV6-%PRED-PRE: 51 %
FEV6-%Pred-Post: 57 %
FEV6-Post: 2.09 L
FEV6-Pre: 1.89 L
FEV6FVC-%PRED-POST: 107 %
FEV6FVC-%Pred-Pre: 107 %
FVC-%Change-Post: 11 %
FVC-%PRED-POST: 53 %
FVC-%PRED-PRE: 48 %
FVC-PRE: 1.89 L
FVC-Post: 2.09 L
POST FEV6/FVC RATIO: 100 %
PRE FEV1/FVC RATIO: 60 %
Post FEV1/FVC ratio: 58 %
Pre FEV6/FVC Ratio: 100 %
RV % pred: 146 %
RV: 3.58 L
TLC % PRED: 91 %
TLC: 6.1 L

## 2015-03-07 NOTE — Progress Notes (Signed)
[facility] NOTE  Patient Care Team: [name], MD as PCP - General (Family Medicine)  CHIEF COMPLAINTS/PURPOSE OF CONSULTATION:  Lung nodules  SEP 2016- RUL NODULES x2 [Bronch ; Dr.Mungal- inconclusive]; CT- multiple bilateral small lung nodules.  # Bladder mass ? Etiology   HISTORY OF PRESENTING ILLNESS:  Joseph Graham 74 y.o. male with a history of long-standing smoking/COPD- noted to have lung nodules x2 right upper lobe on screening CAT scan of the lung September 2016. This was patient's first screening lung CT scan. This was appropriately followed up with a PET scan-that showed FDG avid right upper lobe lung nodules without any evidence of mediastinal or distant metastatic disease. However the PET scan also picked up a non-FDG avid bladder mass. Bronchoscopy with biopsy was inconclusive.   The patient denies any unusual shortness of breath or chest pain or cough or hemoptysis. He denies any blood in urine or any other symptoms of dysuria.  Patient has chronic back pain which is not any worse. He denies any unusual weight loss or headaches or vision changes.  ROS: A complete 10 point review of system is done which is negative for mentioned above in history of present illness.    MEDICAL HISTORY:  Past Medical History  Diagnosis Date  . Colon polyps     next colonoscopy due around 2018  . HTN (hypertension)   . Alcohol abuse, in remission     remote  . Cardiac arrhythmia 03/2010    h/o a flutter and CM, s/p ablation, normal stress test  . Benign essential tremor     improved on metoprolol and gabapentin  . Dyslipidemia     mild off meds  . Chronic low back pain     MRI 2004, multilevel DDD  . BPH (benign prostatic hypertrophy) 12/30/2012  . Personal history of tobacco use, presenting hazards to health 02/18/2015    quit 06/2011, restarted 2014  . Multiple pulmonary nodules 02/2015  . COPD (chronic obstructive pulmonary disease)   . GERD  (gastroesophageal reflux disease)   . Arthritis     BACK AND NECK  . Complication of anesthesia     DID NOT GET COMPLETELY NUMB WITH TONSILLECTOMY  . Shortness of breath dyspnea     OCC WITH EXERTION    SURGICAL HISTORY: Past Surgical History  Procedure Laterality Date  . Hemorroidectomy  1982  . A flutter ablation  03/2010  . Cataract extraction    . Carotid US  03/2010    WNL  . Colonoscopy  09/2011    polyps, rpt due 5 yrs, mild diverticulosis [name])  . Tonsillectomy  1964  . Electromagnetic navigation brochoscopy N/A 03/02/2015    Procedure: ELECTROMAGNETIC NAVIGATION BRONCHOSCOPY;  Surgeon: [name], MD;  Location: [facility] ORS;  Service: Cardiopulmonary;  Laterality: N/A;    SOCIAL HISTORY: Patient is retired from Press photographer. He lives in [place]. Social History   Social History  . Marital Status: Married    Spouse Name: N/A  . Number of Children: N/A  . Years of Education: N/A   Occupational History  . Not on file.   Social History Main Topics  . Smoking status: Current Every Day Smoker -- 1.50 packs/day for 61 years    Types: Cigarettes  . Smokeless tobacco: Never Used     Comment: cut back 0.5 cigarettes--stopped 03/07/15; now now chantix  . Alcohol Use: No     Comment: has not drank in 37 years  . Drug Use:  No  . Sexual Activity: Not on file   Other Topics Concern  . Not on file   Social History Narrative   Caffeine: 1 cup decaf/day   Lives with wife   Occupation: retired, worked Press photographer   Activity: gym 3x/wk, Immunologist, hobbies (paint, building things)   Diet: healthy, good water, fruits/vegetables daily, red meat 1x/wk    FAMILY HISTORY: Family History  Problem Relation Age of Onset  . Cervical cancer Mother 59  . Hypertension Father   . Stroke Brother 8  . Tremor Father     and several uncles/aunts (not parkinson's)  . Bladder Cancer Brother 71    ALLERGIES:  has No Known Allergies.  MEDICATIONS:  Current Outpatient Prescriptions   Medication Sig Dispense Refill  . aspirin EC 81 MG tablet Take 81 mg by mouth daily.    . famotidine (PEPCID) 10 MG tablet Take 10 mg by mouth as needed for heartburn or indigestion.    . gabapentin (NEURONTIN) 100 MG capsule TAKE 1 CAPSULE BY MOUTH TWICE A DAY AS NEEDED (Patient taking differently: Take 100 mg by mouth 2 (two) times daily. TAKE 1 CAPSULE BY MOUTH TWICE A DAY) 180 capsule 3  . lisinopril (PRINIVIL,ZESTRIL) 10 MG tablet Take 1 tablet (10 mg total) by mouth daily. (Patient taking differently: Take 10 mg by mouth every evening. ) 90 tablet 3  . metoprolol tartrate (LOPRESSOR) 25 MG tablet Take 0.5 tablets (12.5 mg total) by mouth 2 (two) times daily. 90 tablet 3  . Multiple Vitamins-Minerals (MULTIVITAMIN PO) Take 1 tablet by mouth daily.    . Naproxen Sodium (ALEVE) 220 MG CAPS Take by mouth 2 (two) times daily.    . rosuvastatin (CRESTOR) 5 MG tablet Take 1 tablet (5 mg total) by mouth daily. 90 tablet 3  . varenicline (CHANTIX CONTINUING MONTH PAK) 1 MG tablet Take 1 tablet (1 mg total) by mouth 2 (two) times daily. 60 tablet 6   No current facility-administered medications for this visit.  Marland Kitchen  PHYSICAL EXAMINATION: ECOG PERFORMANCE STATUS: 0 - Asymptomatic  Filed Vitals:   03/07/15 0900  BP: 138/98  Pulse: 75  Temp: 98 F (36.7 C)  Resp: 18   Filed Weights   03/07/15 0900  Weight: 205 lb 14.6 oz (93.4 kg)    GENERAL:alert, no distress and comfortable. He is accompanied by his wife.  SKIN: skin color, texture, turgor are normal, no rashes or significant lesions EYES: normal, conjunctiva are pink and non-injected, sclera clear OROPHARYNX:no exudate, no erythema and lips, buccal mucosa, and tongue normal  NECK: supple, thyroid normal size, non-tender, without nodularity LYMPH:  no palpable lymphadenopathy in the cervical, axillary or inguinal LUNGS: clear to auscultation and percussion with normal breathing effort HEART: regular rate & rhythm and no murmurs and no  lower extremity edema ABDOMEN:abdomen soft, non-tender and normal bowel sounds Musculoskeletal:no cyanosis of digits and no clubbing  PSYCH: alert & oriented x 3 with fluent speech NEURO: no focal motor/sensory deficits  LABORATORY DATA:  I have reviewed the data as listed Lab Results  Component Value Date   WBC 8.7 12/31/2013   HGB 15.2 12/31/2013   HCT 45.5 12/31/2013   MCV 96.1 12/31/2013   PLT 230.0 12/31/2013    Recent Labs  01/24/15 0818  NA 140  K 4.3  CL 102  CO2 33*  GLUCOSE 113*  BUN 26*  CREATININE 0.79  CALCIUM 9.1    RADIOGRAPHIC STUDIES: I have personally reviewed the radiological images as listed  and agreed with the findings in the report. Nm Pet Image Initial (pi) Skull Base To Thigh  03/01/2015   CLINICAL DATA:  Initial treatment strategy for multiple pulmonary nodules on lung cancer screening CT. Ninety pack-year history of smoking.  EXAM: NUCLEAR MEDICINE PET SKULL BASE TO THIGH  TECHNIQUE: 12.68 mCi F-18 FDG was injected intravenously. Full-ring PET imaging was performed from the skull base to thigh after the radiotracer. CT data was obtained and used for attenuation correction and anatomic localization.  FASTING BLOOD GLUCOSE:  Value: 108 mg/dl  COMPARISON:  Screening chest CT 02/18/2015  FINDINGS: NECK  No hypermetabolic cervical lymph nodes are identified.Activity within [facility] ring is nearly symmetric and within physiologic limits. No focal lesions of the pharyngeal mucosal space identified.  CHEST  There are no hypermetabolic mediastinal, hilar or axillary lymph nodes. The 2 dominant spiculated right upper lobe nodules are both hypermetabolic. The more superior lesion measures 19 x 11 mm on image 72 and has an SUV max of 6.2. The more inferior lesion measures 24 x 17 mm on image 80 and has an SUV max of 14.6. Both of these lesions are highly concerning for bronchogenic carcinoma. There are additional subcentimeter right lung nodules which are too small to  evaluate with PET-CT and were better seen on recent screening study. The largest of these are in the right middle lobe (image 116) and in the right lower lobe (image 124). No suspicious nodule seen on the left. Emphysema, atherosclerosis and aberrant right subclavian artery noted.  ABDOMEN/PELVIS  There is no hypermetabolic activity within the liver, adrenal glands, spleen or pancreas. There is no hypermetabolic nodal activity. Hepatic steatosis, sigmoid diverticulosis and mild enlargement of the prostate gland are noted. There is asymmetric increased soft tissue density within the posterior right bladder lumen on image 241, without asymmetry of the metabolic activity in the urine.  SKELETON  There is no hypermetabolic activity to suggest osseous metastatic disease. A sebaceous cyst is noted in the posterior right back.  IMPRESSION: 1. Both dominant spiculated right upper lobe nodules are hypermetabolic, most concerning for synchronous primary bronchogenic carcinoma. 2. No evidence of metastatic disease. 3. There are additional smaller right lung nodules which are too small to characterize with PET-CT. 4. Potential lesion involving the right side of the bladder concerning for possible transitional cell carcinoma. There is no corresponding photopenic defect as would be expected with blood clot or atypical superior extension of prostate tissue. Correlation with urine cytology recommended, and cystoscopy should be considered.   Electronically Signed   By: Richardean Sale M.D.   On: 03/01/2015 12:06   Dg Chest Port 1 View  03/02/2015   CLINICAL DATA:  Status post bronchoscopy  EXAM: PORTABLE CHEST - 1 VIEW  COMPARISON:  None.  FINDINGS: Cardiac shadow is within normal limits. The lungs are well aerated bilaterally. No post biopsy pneumothorax is noted. Surgical clips are noted in the right apex related to the recent procedure. No acute bony abnormality is noted.  IMPRESSION: No post procedure pneumothorax.    Electronically Signed   By: Inez Catalina M.D.   On: 03/02/2015 15:42   Dg C-arm 1-60 Min-no Report  03/02/2015   CLINICAL DATA: ENB   C-ARM 1-60 MINUTES  Fluoroscopy was utilized by the requesting physician.  No radiographic  interpretation.    Ct Chest Lung Ca Screen Low Dose W/o Cm  02/18/2015   CLINICAL DATA:  74 year old male current smoker with 90 pack-year history of smoking. Lung cancer  screening examination.  EXAM: CT CHEST WITHOUT CONTRAST  TECHNIQUE: Multidetector CT imaging of the chest was performed following the standard protocol without IV contrast.  COMPARISON:  No priors.  FINDINGS: Mediastinum/Lymph Nodes: Heart size is normal. There is no significant pericardial fluid, thickening or pericardial calcification. There is atherosclerosis of the thoracic aorta, the great vessels of the mediastinum and the coronary arteries, including calcified atherosclerotic plaque in the left main, left anterior descending, left circumflex and right coronary arteries. No pathologically enlarged mediastinal or hilar lymph nodes. Please note that accurate exclusion of hilar adenopathy is limited on noncontrast CT scans. Esophagus is unremarkable in appearance. No axillary lymphadenopathy. Aberrant right subclavian artery (normal anatomical variant) incidentally noted.  Lungs/Pleura: Multiple pulmonary nodules in the lungs bilaterally. The largest and most concerning of these are in the right upper lobe demonstrating volume derived mean diameters of 22 mm and 15 mm (images 63 of series 3 and 39 of series 3 respectively), highly concerning for neoplasm. Mild diffuse bronchial wall thickening with moderate centrilobular and paraseptal emphysema. No acute consolidative airspace disease. No pleural effusions.  Upper Abdomen: Diffuse low attenuation throughout the hepatic parenchyma, compatible with hepatic steatosis.  Musculoskeletal/Soft Tissues: There are no aggressive appearing lytic or blastic lesions noted in the  visualized portions of the skeleton.  IMPRESSION: 1. Lung-RADS Category 4BS, highly suspicious. Additional imaging evaluation or consultation with pulmonary medicine and/or thoracic surgery strongly recommended. 2. The "S" modifier above refers to potentially clinically significant non lung cancer related findings. Specifically, Atherosclerosis, including left main and 3 vessel coronary artery disease. Assessment for potential risk factor modification, dietary therapy or pharmacologic therapy may be warranted, if clinically indicated. 3. Mild diffuse bronchial wall thickening with moderate centrilobular and paraseptal emphysema; imaging findings suggestive of underlying COPD. 4. Hepatic steatosis. 5. Aberrant right subclavian artery (normal anatomical variant) incidentally noted. These results will be called to the ordering clinician or representative by the Radiologist Assistant, and communication documented in the PACS or zVision Dashboard.   Electronically Signed   By: Vinnie Langton M.D.   On: 02/18/2015 15:55    ASSESSMENT & PLAN:   # Right upper lobe lung nodules- highly suspicious for primary bronchogenic carcinoma based on imaging. Bronch-inconclusive. Basal clinical characteristics- the lesions are suspicious for non-small cell lung cancer; seems to be early-stage. However, await final pathology at this time   # Bladder mass- unclear etiology; based on PET scan this is not photopenic; hence transitional cell carcinoma is in the differential. Recommend cystoscopy with urology. Patient has been referred to urology today.  # I also recommend holding aspirin and Aleve; recommend Tylenol instead.  # Smoking cessation- patient states that he is motivated to quit smoking; is currently on Chantix  Patient will follow-up with [place] after final pathology. I discussed the above plan with Dr. Faith Rogue.  All questions were answered. The patient knows to call the clinic with any problems, questions or  concerns.   I spent 25 counseling the patient face to face. The total time spent in the appointment was 45 more than 50% was on counseling.     Cammie Sickle, MD 03/07/2015 9:57 AM

## 2015-03-07 NOTE — Progress Notes (Signed)
PFT performed today. 

## 2015-03-07 NOTE — Progress Notes (Signed)
Patient ID: Joseph Graham, male   DOB: [birth year], 74 y.o.   MRN: [id number]  No chief complaint on file.   Referred By Dr. Danise Mina Reason for Referral right lung mass 4  HPI Location, Quality, Duration, Severity, Timing, Context, Modifying Factors, Associated Signs and Symptoms.  Joseph Graham is a 74 y.o. male.  I have personally seen and examined Joseph Graham. He is a 74 year old gentleman who underwent a routine annual screening physical. At the completion of his physical exam he was found to be a candidate for the lung cancer screening CT protocol and this was performed. This revealed 2 dominant right upper lobe lesions as well as a lesion in the right middle lobe and right lower lobes. A subsequent PET scan was performed revealing uptake in both the upper lobe nodules. The 2 smaller lesions in the middle and lower lobes were too small to characterize. The patient had an attempt at a navigational bronchoscopy which wasn't negative for any malignant cells. He is scheduled to undergo pulmonary function testing later today.  He states that he does not get short of breath although he has some difficulty in walking up steps secondary to back pain. He is able to get on a treadmill for 15 minutes a couple of times per week. He has not had any hemoptysis fevers or chills. He denies any weight loss. He is a smoker stating that he smoked 60 years 1 pack per day and quit today.   Past Medical History  Diagnosis Date  . Colon polyps     next colonoscopy due around 2018  . HTN (hypertension)   . Alcohol abuse, in remission     remote  . Cardiac arrhythmia 03/2010    h/o a flutter and CM, s/p ablation, normal stress test  . Benign essential tremor     improved on metoprolol and gabapentin  . Dyslipidemia     mild off meds  . Chronic low back pain     MRI 2004, multilevel DDD  . BPH (benign prostatic hypertrophy) 12/30/2012  . Personal history of tobacco use, presenting hazards to health  02/18/2015    quit 06/2011, restarted 2014  . Multiple pulmonary nodules 02/2015  . COPD (chronic obstructive pulmonary disease)   . GERD (gastroesophageal reflux disease)   . Arthritis     BACK AND NECK  . Complication of anesthesia     DID NOT GET COMPLETELY NUMB WITH TONSILLECTOMY  . Shortness of breath dyspnea     OCC WITH EXERTION    Past Surgical History  Procedure Laterality Date  . Hemorroidectomy  1982  . A flutter ablation  03/2010  . Cataract extraction    . Carotid US  03/2010    WNL  . Colonoscopy  09/2011    polyps, rpt due 5 yrs, mild diverticulosis [name])  . Tonsillectomy  1964  . Electromagnetic navigation brochoscopy N/A 03/02/2015    Procedure: ELECTROMAGNETIC NAVIGATION BRONCHOSCOPY;  Surgeon: [name], MD;  Location: [facility] ORS;  Service: Cardiopulmonary;  Laterality: N/A;    Family History  Problem Relation Age of Onset  . Cervical cancer Mother 67  . Hypertension Father   . Stroke Brother 31  . Tremor Father     and several uncles/aunts (not parkinson's)  . Bladder Cancer Brother 31    Social History Social History  Substance Use Topics  . Smoking status: Current Every Day Smoker -- 1.50 packs/day for 61 years    Types: Cigarettes  . Smokeless  tobacco: Never Used     Comment: cut back 0.5 cigarettes--stopped 03/07/15; now now chantix  . Alcohol Use: No     Comment: has not drank in 37 years    No Known Allergies  Current Outpatient Prescriptions  Medication Sig Dispense Refill  . aspirin EC 81 MG tablet Take 81 mg by mouth daily.    . famotidine (PEPCID) 10 MG tablet Take 10 mg by mouth as needed for heartburn or indigestion.    . gabapentin (NEURONTIN) 100 MG capsule TAKE 1 CAPSULE BY MOUTH TWICE A DAY AS NEEDED (Patient taking differently: Take 100 mg by mouth 2 (two) times daily. TAKE 1 CAPSULE BY MOUTH TWICE A DAY) 180 capsule 3  . lisinopril (PRINIVIL,ZESTRIL) 10 MG tablet Take 1 tablet (10 mg total) by mouth daily. (Patient taking  differently: Take 10 mg by mouth every evening. ) 90 tablet 3  . metoprolol tartrate (LOPRESSOR) 25 MG tablet Take 0.5 tablets (12.5 mg total) by mouth 2 (two) times daily. 90 tablet 3  . Multiple Vitamins-Minerals (MULTIVITAMIN PO) Take 1 tablet by mouth daily.    . Naproxen Sodium (ALEVE) 220 MG CAPS Take by mouth 2 (two) times daily.    . rosuvastatin (CRESTOR) 5 MG tablet Take 1 tablet (5 mg total) by mouth daily. 90 tablet 3  . varenicline (CHANTIX CONTINUING MONTH PAK) 1 MG tablet Take 1 tablet (1 mg total) by mouth 2 (two) times daily. 60 tablet 6   No current facility-administered medications for this visit.      Review of Systems A complete review of systems was asked and was negative except for the following positive findings shortness of breath, wheezing, abdominal pain with heartburn, joint pain, localize weakness.  There were no vitals taken for this visit.  Physical Exam CONSTITUTIONAL:  Pleasant, well-developed, well-nourished, and in no acute distress. EYES: Pupils equal and reactive to light, Sclera non-icteric EARS, NOSE, MOUTH AND THROAT:  The oropharynx was clear.  Dentition is good repair.  Oral mucosa pink and moist. LYMPH NODES:  Lymph nodes in the neck and axillae were normal RESPIRATORY:  Lungs were clear.  Normal respiratory effort without pathologic use of accessory muscles of respiration CARDIOVASCULAR: Heart was regular without murmurs.  There were no carotid bruits. GI: The abdomen was soft, nontender, and nondistended. There were no palpable masses. There was no hepatosplenomegaly. There were normal bowel sounds in all quadrants. GU:  Rectal deferred.   MUSCULOSKELETAL:  Normal muscle strength and tone.  No clubbing or cyanosis.   SKIN:  There were no pathologic skin lesions.  There were no nodules on palpation. NEUROLOGIC:  Sensation is normal.  Cranial nerves are grossly intact. PSYCH:  Oriented to person, place and time.  Mood and affect are  normal.  Data Reviewed I have personally and independently reviewed his CT scan and PET scan.  I have personally reviewed the patient's imaging, laboratory findings and medical records.    Assessment    I am very concerned that all for these lesions may be bronchogenic carcinomas. The lesion in the bladder may also be malignant as well. I had a long discussion with the patient regarding the options. I believe that an aggressive surgical approach would be a right thoracotomy with right upper lobectomy and wedge resection of the other smaller nodules. Alternatively if he does not wish to pursue an aggressive surgical approach he may be a candidate for radiation therapy or chemotherapy. I reviewed with him the indications and risks of  thoracotomy with resection of these lesions.    Plan    He is scheduled to undergo pulmonary function testing today. I will see him back later this week. He will evaluate all this treatment options.       [name], MD 03/07/2015, 10:29 AM

## 2015-03-09 ENCOUNTER — Encounter: Payer: Self-pay | Admitting: Urology

## 2015-03-09 ENCOUNTER — Ambulatory Visit (INDEPENDENT_AMBULATORY_CARE_PROVIDER_SITE_OTHER): Payer: Medicare Other | Admitting: Urology

## 2015-03-09 VITALS — BP 162/69 | HR 67 | Ht 68.0 in | Wt 207.8 lb

## 2015-03-09 DIAGNOSIS — R312 Other microscopic hematuria: Secondary | ICD-10-CM

## 2015-03-09 DIAGNOSIS — J984 Other disorders of lung: Secondary | ICD-10-CM

## 2015-03-09 DIAGNOSIS — R3129 Other microscopic hematuria: Secondary | ICD-10-CM

## 2015-03-09 DIAGNOSIS — N3289 Other specified disorders of bladder: Secondary | ICD-10-CM | POA: Diagnosis not present

## 2015-03-09 MED ORDER — CIPROFLOXACIN HCL 500 MG PO TABS
500.0000 mg | ORAL_TABLET | Freq: Once | ORAL | Status: AC
  Administered 2015-03-09: 500 mg via ORAL

## 2015-03-09 MED ORDER — LIDOCAINE HCL 2 % EX GEL
1.0000 "application " | Freq: Once | CUTANEOUS | Status: AC
  Administered 2015-03-09: 1 via URETHRAL

## 2015-03-09 NOTE — Progress Notes (Signed)
03/09/2015 2:13 PM   Joseph Graham 26-Sep-1940 [id number]  Referring provider: [name], MD [place], [place] [address]  CC: Bladder Lesion  HPI: The patient is a 74 year old gentleman who presents for evaluation of bladder mass. The patient is undergoing annual screening and met criteria for a CT scan which also led to the discovery of lung nodules. During that workup, a bladder mass was seen on CT PET. He presents today for further workup of this lesion. The patient denies any urinary symptoms including no nocturia, frequency, urgency, hematuria, kidney stones.   PMH: Past Medical History  Diagnosis Date  . Colon polyps     next colonoscopy due around 2018  . HTN (hypertension)   . Alcohol abuse, in remission     remote  . Cardiac arrhythmia 03/2010    h/o a flutter and CM, s/p ablation, normal stress test  . Benign essential tremor     improved on metoprolol and gabapentin  . Dyslipidemia     mild off meds  . Chronic low back pain     MRI 2004, multilevel DDD  . BPH (benign prostatic hypertrophy) 12/30/2012  . Personal history of tobacco use, presenting hazards to health 02/18/2015    quit 06/2011, restarted 2014  . Multiple pulmonary nodules 02/2015  . COPD (chronic obstructive pulmonary disease)   . GERD (gastroesophageal reflux disease)   . Arthritis     BACK AND NECK  . Complication of anesthesia     DID NOT GET COMPLETELY NUMB WITH TONSILLECTOMY  . Shortness of breath dyspnea     OCC WITH EXERTION    Surgical History: Past Surgical History  Procedure Laterality Date  . Hemorroidectomy  1982  . A flutter ablation  03/2010  . Cataract extraction    . Carotid US  03/2010    WNL  . Colonoscopy  09/2011    polyps, rpt due 5 yrs, mild diverticulosis [name])  . Tonsillectomy  1964  . Electromagnetic navigation brochoscopy N/A 03/02/2015    Procedure: ELECTROMAGNETIC NAVIGATION BRONCHOSCOPY;  Surgeon: [name], MD;  Location: [facility]  ORS;  Service: Cardiopulmonary;  Laterality: N/A;    Home Medications:    Medication List       This list is accurate as of: 03/09/15  2:13 PM.  Always use your most recent med list.               ALEVE 220 MG Caps  Generic drug:  Naproxen Sodium  Take by mouth 2 (two) times daily.     aspirin EC 81 MG tablet  Take 81 mg by mouth daily.     famotidine 10 MG tablet  Commonly known as:  PEPCID  Take 10 mg by mouth as needed for heartburn or indigestion.     gabapentin 100 MG capsule  Commonly known as:  NEURONTIN  TAKE 1 CAPSULE BY MOUTH TWICE A DAY AS NEEDED     lisinopril 10 MG tablet  Commonly known as:  PRINIVIL,ZESTRIL  Take 1 tablet (10 mg total) by mouth daily.     metoprolol tartrate 25 MG tablet  Commonly known as:  LOPRESSOR  Take 0.5 tablets (12.5 mg total) by mouth 2 (two) times daily.     MULTIVITAMIN PO  Take 1 tablet by mouth daily.     rosuvastatin 5 MG tablet  Commonly known as:  CRESTOR  Take 1 tablet (5 mg total) by mouth daily.     varenicline 1 MG  tablet  Commonly known as:  CHANTIX CONTINUING MONTH PAK  Take 1 tablet (1 mg total) by mouth 2 (two) times daily.        Allergies: No Known Allergies  Family History: Family History  Problem Relation Age of Onset  . Cervical cancer Mother 30  . Hypertension Father   . Stroke Brother 12  . Tremor Father     and several uncles/aunts (not parkinson's)  . Bladder Cancer Brother 17  . Prostate cancer Neg Hx   . Kidney cancer Neg Hx     Social History:  reports that he has quit smoking. His smoking use included Cigarettes. He has a 91.5 pack-year smoking history. He has never used smokeless tobacco. He reports that he does not drink alcohol or use illicit drugs.  ROS: UROLOGY Frequent Urination?: No Hard to postpone urination?: No Burning/pain with urination?: No Get up at night to urinate?: No Leakage of urine?: No Urine stream starts and stops?: No Trouble starting stream?: No Do  you have to strain to urinate?: No Blood in urine?: No Urinary tract infection?: No Sexually transmitted disease?: No Injury to kidneys or bladder?: No Painful intercourse?: No Weak stream?: No Erection problems?: No Penile pain?: No  Gastrointestinal Nausea?: No Vomiting?: No Indigestion/heartburn?: Yes Diarrhea?: No Constipation?: No  Constitutional Fever: No Night sweats?: No Weight loss?: No Fatigue?: No  Skin Skin rash/lesions?: No Itching?: No  Eyes Blurred vision?: No Double vision?: No  Ears/Nose/Throat Sore throat?: No Sinus problems?: No  Hematologic/Lymphatic Swollen glands?: No Easy bruising?: No  Cardiovascular Leg swelling?: No Chest pain?: No  Respiratory Cough?: Yes Shortness of breath?: Yes  Endocrine Excessive thirst?: No  Musculoskeletal Back pain?: Yes Joint pain?: Yes  Neurological Headaches?: No Dizziness?: No  Psychologic Depression?: No Anxiety?: No  Physical Exam: BP 162/69 mmHg  Pulse 67  Ht '5\' 8"'  (1.727 m)  Wt 207 lb 12.8 oz (94.257 kg)  BMI 31.60 kg/m2  Constitutional:  Alert and oriented, No acute distress. HEENT:  AT, moist mucus membranes.  Trachea midline, no masses. Cardiovascular: No clubbing, cyanosis, or edema. Respiratory: Normal respiratory effort, no increased work of breathing. GI: Abdomen is soft, nontender, nondistended, no abdominal masses GU: No CVA tenderness. Normal phallus. Testicles descended equally bilaterally. Skin: No rashes, bruises or suspicious lesions. Lymph: No cervical or inguinal adenopathy. Neurologic: Grossly intact, no focal deficits, moving all 4 extremities. Psychiatric: Normal mood and affect.  Laboratory Data: Lab Results  Component Value Date   WBC 8.7 12/31/2013   HGB 15.2 12/31/2013   HCT 45.5 12/31/2013   MCV 96.1 12/31/2013   PLT 230.0 12/31/2013    Lab Results  Component Value Date   CREATININE 0.79 01/24/2015    Lab Results  Component Value Date    PSA 0.60 01/24/2015   PSA 0.68 12/31/2013   PSA 0.67 12/30/2012    No results found for: TESTOSTERONE  Lab Results  Component Value Date   HGBA1C 6.3 01/24/2015    Urinalysis No results found for: COLORURINE, APPEARANCEUR, LABSPEC, PHURINE, GLUCOSEU, HGBUR, BILIRUBINUR, KETONESUR, PROTEINUR, UROBILINOGEN, NITRITE, LEUKOCYTESUR   U/A: 3-10 RBC  Pertinent Imaging: IMPRESSION: 1. Both dominant spiculated right upper lobe nodules are hypermetabolic, most concerning for synchronous primary bronchogenic carcinoma. 2. No evidence of metastatic disease. 3. There are additional smaller right lung nodules which are too small to characterize with PET-CT. 4. Potential lesion involving the right side of the bladder concerning for possible transitional cell carcinoma. There is no corresponding photopenic defect as  would be expected with blood clot or atypical superior extension of prostate tissue. Correlation with urine cytology recommended, and cystoscopy should be considered.   Cystoscopy Procedure Note  Patient identification was confirmed, informed consent was obtained, and patient was prepped using Betadine solution.  Lidocaine jelly was administered per urethral meatus.    Preoperative abx where received prior to procedure.     Pre-Procedure: - Inspection reveals a normal caliber ureteral meatus.  Procedure: The flexible cystoscope was introduced without difficulty - No urethral strictures/lesions are present. - Enlarged prostate  - Normal bladder neck - Bilateral ureteral orifices identified - No bladder stones - No trabeculation  Exophytic, papillary 2-3 cm lesion midline posterior bladder wall superior to trigone  Retroflexion unremarkable except for above   Post-Procedure: - Patient tolerated the procedure well   Assessment & Plan:    The patient has an incidentally found bladder lesion on the floor of the bladder. It looks very exophytic and papillary on  cystoscopy. The patient has a more serious medical problem with his presumed lung carcinoma. I think it would be in his best interest if this is treated first.  1. Bladder Lesion - posterior wall, papillary, exophytic -TURBT  2. Microscopic Hematuria -will need retrograde pyelogram at time of TURBT above (CT-PET completed but no delayed images taken) -urine culture  3. Pulmonary Lesions concerning for carcinoma -management per CT surgery   Will plan for TURBT/Retrograde Pyelogram after definitive management of lung cancer. Patient to call to schedule appointment at that time.   [name], MD  [facility] [address] [place], [place] [address] [phone]

## 2015-03-10 ENCOUNTER — Inpatient Hospital Stay: Payer: Medicare Other | Admitting: Cardiothoracic Surgery

## 2015-03-11 DIAGNOSIS — H43813 Vitreous degeneration, bilateral: Secondary | ICD-10-CM | POA: Diagnosis not present

## 2015-03-11 LAB — CULTURE, URINE COMPREHENSIVE

## 2015-03-14 ENCOUNTER — Other Ambulatory Visit: Payer: Self-pay | Admitting: Cardiothoracic Surgery

## 2015-03-14 DIAGNOSIS — R918 Other nonspecific abnormal finding of lung field: Secondary | ICD-10-CM

## 2015-03-14 LAB — MICROSCOPIC EXAMINATION
EPITHELIAL CELLS (NON RENAL): NONE SEEN /HPF (ref 0–10)
RENAL EPITHEL UA: NONE SEEN /HPF

## 2015-03-14 LAB — URINALYSIS, COMPLETE
BILIRUBIN UA: NEGATIVE
Glucose, UA: NEGATIVE
Ketones, UA: NEGATIVE
Leukocytes, UA: NEGATIVE
NITRITE UA: NEGATIVE
PH UA: 7 (ref 5.0–7.5)
Protein, UA: NEGATIVE
Specific Gravity, UA: 1.02 (ref 1.005–1.030)
UUROB: 0.2 mg/dL (ref 0.2–1.0)

## 2015-03-17 ENCOUNTER — Encounter: Payer: Self-pay | Admitting: *Deleted

## 2015-03-17 ENCOUNTER — Encounter: Payer: Self-pay | Admitting: Radiation Oncology

## 2015-03-17 ENCOUNTER — Inpatient Hospital Stay: Payer: Medicare Other | Attending: Cardiothoracic Surgery | Admitting: Cardiothoracic Surgery

## 2015-03-17 ENCOUNTER — Ambulatory Visit
Admission: RE | Admit: 2015-03-17 | Discharge: 2015-03-17 | Disposition: A | Discharge status: Home / Self Care | Payer: Medicare Other | Source: Ambulatory Visit | Attending: Radiation Oncology | Admitting: Radiation Oncology

## 2015-03-17 VITALS — BP 125/78 | HR 67 | Temp 97.6°F | Wt 210.8 lb

## 2015-03-17 DIAGNOSIS — Z510 Encounter for antineoplastic radiation therapy: Secondary | ICD-10-CM | POA: Insufficient documentation

## 2015-03-17 DIAGNOSIS — Z87891 Personal history of nicotine dependence: Secondary | ICD-10-CM | POA: Insufficient documentation

## 2015-03-17 DIAGNOSIS — I499 Cardiac arrhythmia, unspecified: Secondary | ICD-10-CM | POA: Insufficient documentation

## 2015-03-17 DIAGNOSIS — C3411 Malignant neoplasm of upper lobe, right bronchus or lung: Secondary | ICD-10-CM | POA: Insufficient documentation

## 2015-03-17 DIAGNOSIS — Z8049 Family history of malignant neoplasm of other genital organs: Secondary | ICD-10-CM | POA: Insufficient documentation

## 2015-03-17 DIAGNOSIS — R918 Other nonspecific abnormal finding of lung field: Secondary | ICD-10-CM

## 2015-03-17 DIAGNOSIS — I10 Essential (primary) hypertension: Secondary | ICD-10-CM | POA: Insufficient documentation

## 2015-03-17 DIAGNOSIS — N400 Enlarged prostate without lower urinary tract symptoms: Secondary | ICD-10-CM | POA: Insufficient documentation

## 2015-03-17 DIAGNOSIS — Z8052 Family history of malignant neoplasm of bladder: Secondary | ICD-10-CM | POA: Insufficient documentation

## 2015-03-17 DIAGNOSIS — J449 Chronic obstructive pulmonary disease, unspecified: Secondary | ICD-10-CM | POA: Insufficient documentation

## 2015-03-17 NOTE — Progress Notes (Signed)
Patient here for follow up

## 2015-03-17 NOTE — Consult Note (Signed)
Except an outstanding is perfect of Radiation Oncology NEW PATIENT EVALUATION  Name: Joseph Graham  MRN: [id number]  Date:   03/17/2015     DOB: [birth year]   This 74 y.o. male patient presents to the clinic for initial evaluation of right upper lobe probable non-small cell lung cancer 2 separate lesions both T1 lesions for consideration of radiation therapy.  REFERRING PHYSICIAN: [name], MD  CHIEF COMPLAINT: No chief complaint on file.   DIAGNOSIS: The encounter diagnosis was Lung mass.   PREVIOUS INVESTIGATIONS:  PET CT scan reviewed Cytology report reviewed showing no evidence of malignancy repeat FNA planned Clinical notes reviewed  HPI: Patient is a 74 year old male long-standing history of COPD and smoking was found on screening CT scan in September 2016 2 separate nodules in the right upper lobe. PET CT scan show both lesions were hypermetabolic with no evidence of mediastinal or distant metastasis. PET scan to pick up a bladder mass he has had cystoscopy and probably has early stage transitional cell carcinoma the bladder which at this time is taking a backseat to his lung cancer. He has been seen by surgical oncology based on his poor pulmonary functions with an FEV1 of 30% of predicted not thought to be a surgical candidate. He is had navigational bronchoscopy which showed negative cytology. A fine-needle aspiration CT-guided is planned for Monday of next week. Patient is doing fairly well does have chronic back pain. Specifically denies cough hemoptysis or chest tightness.  PLANNED TREATMENT REGIMEN: Probable I MRT radiation therapy  PAST MEDICAL HISTORY:  has a past medical history of Colon polyps; HTN (hypertension); Alcohol abuse, in remission; Cardiac arrhythmia (03/2010); Benign essential tremor; Dyslipidemia; Chronic low back pain; BPH (benign prostatic hypertrophy) (12/30/2012); Personal history of tobacco use, presenting hazards to health (02/18/2015); Multiple  pulmonary nodules (02/2015); COPD (chronic obstructive pulmonary disease) (HCC); GERD (gastroesophageal reflux disease); Arthritis; Complication of anesthesia; and Shortness of breath dyspnea.    PAST SURGICAL HISTORY:  Past Surgical History  Procedure Laterality Date  . Hemorroidectomy  1982  . A flutter ablation  03/2010  . Cataract extraction    . Carotid US  03/2010    WNL  . Colonoscopy  09/2011    polyps, rpt due 5 yrs, mild diverticulosis [name])  . Tonsillectomy  1964  . Electromagnetic navigation brochoscopy N/A 03/02/2015    Procedure: ELECTROMAGNETIC NAVIGATION BRONCHOSCOPY;  Surgeon: [name], MD;  Location: [facility] ORS;  Service: Cardiopulmonary;  Laterality: N/A;    FAMILY HISTORY: family history includes Bladder Cancer (age of onset: 18) in his brother; Cervical cancer (age of onset: 30) in his mother; Hypertension in his father; Stroke (age of onset: 86) in his brother; Tremor in his father. There is no history of Prostate cancer or Kidney cancer.  SOCIAL HISTORY:  reports that he has quit smoking. His smoking use included Cigarettes. He has a 91.5 pack-year smoking history. He has never used smokeless tobacco. He reports that he does not drink alcohol or use illicit drugs.  ALLERGIES: Review of patient's allergies indicates no known allergies.  MEDICATIONS:  Current Outpatient Prescriptions  Medication Sig Dispense Refill  . aspirin EC 81 MG tablet Take 81 mg by mouth daily.    . famotidine (PEPCID) 10 MG tablet Take 10 mg by mouth as needed for heartburn or indigestion.    . gabapentin (NEURONTIN) 100 MG capsule TAKE 1 CAPSULE BY MOUTH TWICE A DAY AS NEEDED (Patient taking differently: Take 100 mg by mouth 2 (two) times  daily. TAKE 1 CAPSULE BY MOUTH TWICE A DAY) 180 capsule 3  . lisinopril (PRINIVIL,ZESTRIL) 10 MG tablet Take 1 tablet (10 mg total) by mouth daily. (Patient taking differently: Take 10 mg by mouth every evening. ) 90 tablet 3  . metoprolol tartrate  (LOPRESSOR) 25 MG tablet Take 0.5 tablets (12.5 mg total) by mouth 2 (two) times daily. 90 tablet 3  . Multiple Vitamins-Minerals (MULTIVITAMIN PO) Take 1 tablet by mouth daily.    . Naproxen Sodium (ALEVE) 220 MG CAPS Take by mouth 2 (two) times daily.    . rosuvastatin (CRESTOR) 5 MG tablet Take 1 tablet (5 mg total) by mouth daily. 90 tablet 3  . varenicline (CHANTIX CONTINUING MONTH PAK) 1 MG tablet Take 1 tablet (1 mg total) by mouth 2 (two) times daily. 60 tablet 6   No current facility-administered medications for this encounter.    ECOG PERFORMANCE STATUS:  0 - Asymptomatic  REVIEW OF SYSTEMS:  Patient denies any weight loss, fatigue, weakness, fever, chills or night sweats. Patient denies any loss of vision, blurred vision. Patient denies any ringing  of the ears or hearing loss. No irregular heartbeat. Patient denies heart murmur or history of fainting. Patient denies any chest pain or pain radiating to her upper extremities. Patient denies any shortness of breath, difficulty breathing at night, cough or hemoptysis. Patient denies any swelling in the lower legs. Patient denies any nausea vomiting, vomiting of blood, or coffee ground material in the vomitus. Patient denies any stomach pain. Patient states has had normal bowel movements no significant constipation or diarrhea. Patient denies any dysuria, hematuria or significant nocturia. Patient denies any problems walking, swelling in the joints or loss of balance. Patient denies any skin changes, loss of hair or loss of weight. Patient denies any excessive worrying or anxiety or significant depression. Patient denies any problems with insomnia. Patient denies excessive thirst, polyuria, polydipsia. Patient denies any swollen glands, patient denies easy bruising or easy bleeding. Patient denies any recent infections, allergies or URI. Patient "s visual fields have not changed significantly in recent time.    PHYSICAL EXAM: There were no  vitals taken for this visit.  Well-developed well-nourished patient in NAD. HEENT reveals PERLA, EOMI, discs not visualized.  Oral cavity is clear. No oral mucosal lesions are identified. Neck is clear without evidence of cervical or supraclavicular adenopathy. Lungs are clear to A&P. Cardiac examination is essentially unremarkable with regular rate and rhythm without murmur rub or thrill. Abdomen is benign with no organomegaly or masses noted. Motor sensory and DTR levels are equal and symmetric in the upper and lower extremities. Cranial nerves II through XII are grossly intact. Proprioception is intact. No peripheral adenopathy or edema is identified. No motor or sensory levels are noted. Crude visual fields are within normal range.  LABORATORY DATA: Cytology reports are reviewed will review pathology report prior to CT simulation from fine needle aspiration been performed next week    RADIOLOGY RESULTS: CT scans and PET/CT scans reviewed and compatible with the above-stated findings   IMPRESSION: Probable to stage I non-small cell lung cancers of the right upper lobe in 74 year old male  PLAN: At this time I'm awaiting his final pathology. I can incorporate both lesions in the similar field although this will be too large an area to treat with SB RT. Would plan on delivering 7000 cGy over 7 weeks to the 2 lesions. Risks and benefits of treatment including cough, possible radiation esophagitis, fatigue, alteration of blood  counts all were discussed in detail with the patient and his wife. Based on his extremely poor lung function would try to spare as much normal lung volume is possible this is why believe I am RT treatment planning and delivery would be best suited for this purpose. I have set up and ordered CT simulation the following week. Will review his cytology prior to beginning any treatment planning.  I would like to take this opportunity for allowing me to participate in the care of your  patient.Roda Shutters Kert Shackett MS M.D.

## 2015-03-17 NOTE — Progress Notes (Signed)
Joseph Graham Inpatient Post-Op Note  Patient ID: Joseph Graham, male   DOB: 1940/08/03, 74 y.o.   MRN: [id number]  HISTORY: This patient returns today in follow-up. He was a participant in the lung cancer screening and had a CT scan done showing for concerning lesions within the right lung. In addition he had a PET scan done showing uptake into the 4 lesions were as the other 2 were too small to characterize. He had a bladder cancer identified as well and recently underwent a cystoscopy. Those results are not yet available. In preparation for a surgical evaluation he had pulmonary function studies done which reveal an FEV1 of 38% of predicted and a DLCO of 70% of predicted. Postbronchodilator his pulmonary functions improved only marginally. Discussions were had with Dr. Edwin Dada who did not feel that any medical therapy could be done to improve his overall pulmonary function. The patient continues to be short of breath with exertion. He was able to walk into the clinic today without any significant problems.   Filed Vitals:   03/17/15 0851  BP: 125/78  Pulse: 67  Temp: 97.6 F (36.4 C)     EXAM: Resp: Lungs are clear bilaterally.  No respiratory distress, normal effort. Heart:  Regular without murmurs Abd:  Abdomen is soft, non distended and non tender. No masses are palpable.  There is no rebound and no guarding.  Neurological: Alert and oriented to person, place, and time. Coordination normal.  Skin: Skin is warm and dry. No rash noted. No diaphoretic. No erythema. No pallor.  Psychiatric: Normal mood and affect. Normal behavior. Judgment and thought content normal.    ASSESSMENT: I have independently reviewed the patient's pulmonary function tests. Of also discussed with the patient and his wife the results of the CT scan and PET scan. I had a long discussion regarding the risks of surgery and the presence of severe intrinsic lung disease.  Given the extent of the patient's underlying lung  disease and the probability that these represent multiple lung cancers I think that surgery is probably not the best option for him. I explained this to the patient and he understands.   PLAN:   The patient be seen today by Dr. Donella Stade for his input as well. He has 2 significant problems and that he has lung cancer and bladder cancer. We did discuss the possibility of referring him for pulmonary rehabilitation and he will think about this. I did not make a return follow-up for the patient.    [name], MD

## 2015-03-18 ENCOUNTER — Other Ambulatory Visit: Payer: Self-pay | Admitting: Radiology

## 2015-03-21 ENCOUNTER — Ambulatory Visit
Admission: RE | Admit: 2015-03-21 | Discharge: 2015-03-21 | Disposition: A | Discharge status: Home / Self Care | Payer: Medicare Other | Source: Ambulatory Visit | Attending: Cardiothoracic Surgery | Admitting: Cardiothoracic Surgery

## 2015-03-21 ENCOUNTER — Other Ambulatory Visit: Payer: Self-pay | Admitting: Cardiothoracic Surgery

## 2015-03-21 ENCOUNTER — Inpatient Hospital Stay
Admission: RE | Admit: 2015-03-21 | Discharge: 2015-03-26 | DRG: 200 | Disposition: A | Discharge status: Home / Self Care | Payer: Medicare Other | Source: Ambulatory Visit | Attending: Cardiothoracic Surgery | Admitting: Cardiothoracic Surgery

## 2015-03-21 VITALS — BP 129/57 | HR 66 | Temp 97.7°F | Resp 18 | Ht 68.0 in | Wt 205.0 lb

## 2015-03-21 DIAGNOSIS — I10 Essential (primary) hypertension: Secondary | ICD-10-CM | POA: Diagnosis present

## 2015-03-21 DIAGNOSIS — Z86010 Personal history of colonic polyps: Secondary | ICD-10-CM

## 2015-03-21 DIAGNOSIS — Z09 Encounter for follow-up examination after completed treatment for conditions other than malignant neoplasm: Secondary | ICD-10-CM

## 2015-03-21 DIAGNOSIS — R918 Other nonspecific abnormal finding of lung field: Secondary | ICD-10-CM | POA: Diagnosis not present

## 2015-03-21 DIAGNOSIS — J95811 Postprocedural pneumothorax: Principal | ICD-10-CM | POA: Diagnosis present

## 2015-03-21 DIAGNOSIS — M479 Spondylosis, unspecified: Secondary | ICD-10-CM | POA: Diagnosis present

## 2015-03-21 DIAGNOSIS — Z79899 Other long term (current) drug therapy: Secondary | ICD-10-CM | POA: Diagnosis not present

## 2015-03-21 DIAGNOSIS — Z87891 Personal history of nicotine dependence: Secondary | ICD-10-CM | POA: Diagnosis not present

## 2015-03-21 DIAGNOSIS — Z4682 Encounter for fitting and adjustment of non-vascular catheter: Secondary | ICD-10-CM | POA: Diagnosis not present

## 2015-03-21 DIAGNOSIS — Z8249 Family history of ischemic heart disease and other diseases of the circulatory system: Secondary | ICD-10-CM

## 2015-03-21 DIAGNOSIS — Z8052 Family history of malignant neoplasm of bladder: Secondary | ICD-10-CM | POA: Diagnosis not present

## 2015-03-21 DIAGNOSIS — Z8049 Family history of malignant neoplasm of other genital organs: Secondary | ICD-10-CM | POA: Diagnosis not present

## 2015-03-21 DIAGNOSIS — Z823 Family history of stroke: Secondary | ICD-10-CM | POA: Diagnosis not present

## 2015-03-21 DIAGNOSIS — K219 Gastro-esophageal reflux disease without esophagitis: Secondary | ICD-10-CM | POA: Diagnosis present

## 2015-03-21 DIAGNOSIS — Z7982 Long term (current) use of aspirin: Secondary | ICD-10-CM | POA: Diagnosis not present

## 2015-03-21 DIAGNOSIS — C3411 Malignant neoplasm of upper lobe, right bronchus or lung: Secondary | ICD-10-CM | POA: Diagnosis not present

## 2015-03-21 DIAGNOSIS — E785 Hyperlipidemia, unspecified: Secondary | ICD-10-CM | POA: Diagnosis present

## 2015-03-21 DIAGNOSIS — G250 Essential tremor: Secondary | ICD-10-CM | POA: Diagnosis present

## 2015-03-21 DIAGNOSIS — C7801 Secondary malignant neoplasm of right lung: Secondary | ICD-10-CM | POA: Diagnosis not present

## 2015-03-21 DIAGNOSIS — J449 Chronic obstructive pulmonary disease, unspecified: Secondary | ICD-10-CM | POA: Diagnosis not present

## 2015-03-21 DIAGNOSIS — J939 Pneumothorax, unspecified: Secondary | ICD-10-CM | POA: Diagnosis not present

## 2015-03-21 DIAGNOSIS — C679 Malignant neoplasm of bladder, unspecified: Secondary | ICD-10-CM | POA: Diagnosis not present

## 2015-03-21 DIAGNOSIS — J9811 Atelectasis: Secondary | ICD-10-CM | POA: Diagnosis not present

## 2015-03-21 LAB — CBC
HEMATOCRIT: 40.9 % (ref 40.0–52.0)
HEMATOCRIT: 40.7 % (ref 40.0–52.0)
Hemoglobin: 13.7 g/dL (ref 13.0–18.0)
Hemoglobin: 13.6 g/dL (ref 13.0–18.0)
MCH: 31.5 pg (ref 26.0–34.0)
MCH: 31.4 pg (ref 26.0–34.0)
MCHC: 33.5 g/dL (ref 32.0–36.0)
MCHC: 33.3 g/dL (ref 32.0–36.0)
MCV: 94.1 fL (ref 80.0–100.0)
MCV: 94.4 fL (ref 80.0–100.0)
PLATELETS: 227 10*3/uL (ref 150–440)
Platelets: 216 10*3/uL (ref 150–440)
RBC: 4.35 MIL/uL — AB (ref 4.40–5.90)
RBC: 4.32 MIL/uL — AB (ref 4.40–5.90)
RDW: 13.4 % (ref 11.5–14.5)
RDW: 13.4 % (ref 11.5–14.5)
WBC: 7.1 10*3/uL (ref 3.8–10.6)
WBC: 11 10*3/uL — AB (ref 3.8–10.6)

## 2015-03-21 LAB — BASIC METABOLIC PANEL
ANION GAP: 8 (ref 5–15)
BUN: 34 mg/dL — ABNORMAL HIGH (ref 6–20)
CO2: 29 mmol/L (ref 22–32)
Calcium: 9.3 mg/dL (ref 8.9–10.3)
Chloride: 102 mmol/L (ref 101–111)
Creatinine, Ser: 0.99 mg/dL (ref 0.61–1.24)
GFR calc Af Amer: >60 mL/min (ref >60)
GFR calc non Af Amer: >60 mL/min (ref >60)
GLUCOSE: 120 mg/dL — AB (ref 65–99)
POTASSIUM: 4.4 mmol/L (ref 3.5–5.1)
Sodium: 139 mmol/L (ref 135–145)

## 2015-03-21 LAB — APTT: aPTT: 28 seconds (ref 24–36)

## 2015-03-21 LAB — PROTIME-INR
INR: 1.07
Prothrombin Time: 14.1 seconds (ref 11.4–15.0)

## 2015-03-21 MED ORDER — LISINOPRIL 10 MG PO TABS
10.0000 mg | ORAL_TABLET | Freq: Every evening | ORAL | Status: DC
  Administered 2015-03-23 – 2015-03-24 (×2): 10 mg via ORAL
  Filled 2015-03-21 (×2): qty 1

## 2015-03-21 MED ORDER — GABAPENTIN 100 MG PO CAPS
100.0000 mg | ORAL_CAPSULE | Freq: Two times a day (BID) | ORAL | Status: DC
  Administered 2015-03-21 – 2015-03-24 (×6): 100 mg via ORAL
  Filled 2015-03-21 (×6): qty 1

## 2015-03-21 MED ORDER — HYDROCODONE-ACETAMINOPHEN 5-325 MG PO TABS
ORAL_TABLET | ORAL | Status: AC
  Filled 2015-03-21: qty 1

## 2015-03-21 MED ORDER — SODIUM CHLORIDE 0.9 % IV SOLN
INTRAVENOUS | Status: DC
  Administered 2015-03-21: 12:00:00 via INTRAVENOUS

## 2015-03-21 MED ORDER — ONDANSETRON HCL 4 MG/2ML IJ SOLN: 4.0000 mg | Freq: Four times a day (QID) | INTRAMUSCULAR | Status: DC | PRN

## 2015-03-21 MED ORDER — HYDROCODONE-ACETAMINOPHEN 5-325 MG PO TABS
1.0000 | ORAL_TABLET | ORAL | Status: DC | PRN
Start: 2015-03-21 — End: 2015-03-26
  Administered 2015-03-21 (×3): 1 via ORAL
  Filled 2015-03-21 (×2): qty 2
  Filled 2015-03-21: qty 1
  Filled 2015-03-21: qty 2

## 2015-03-21 MED ORDER — FAMOTIDINE 20 MG PO TABS
10.0000 mg | ORAL_TABLET | ORAL | Status: DC | PRN
  Administered 2015-03-21 – 2015-03-22 (×2): 10 mg via ORAL
  Filled 2015-03-21 (×3): qty 1

## 2015-03-21 MED ORDER — MIDAZOLAM HCL 5 MG/5ML IJ SOLN
INTRAMUSCULAR | Status: AC | PRN
  Administered 2015-03-21: 1 mg via INTRAVENOUS

## 2015-03-21 MED ORDER — BISACODYL 5 MG PO TBEC
10.0000 mg | DELAYED_RELEASE_TABLET | Freq: Every day | ORAL | Status: DC
  Filled 2015-03-21: qty 2

## 2015-03-21 MED ORDER — HYDROCODONE-ACETAMINOPHEN 5-325 MG PO TABS
ORAL_TABLET | ORAL | Status: AC
  Administered 2015-03-21: 1 via ORAL
  Filled 2015-03-21: qty 1

## 2015-03-21 MED ORDER — ALBUTEROL SULFATE (2.5 MG/3ML) 0.083% IN NEBU
2.5000 mg | INHALATION_SOLUTION | RESPIRATORY_TRACT | Status: DC
  Administered 2015-03-21 – 2015-03-23 (×7): 2.5 mg via RESPIRATORY_TRACT
  Filled 2015-03-21 (×8): qty 3

## 2015-03-21 MED ORDER — ROSUVASTATIN CALCIUM 5 MG PO TABS
5.0000 mg | ORAL_TABLET | Freq: Every day | ORAL | Status: DC
  Filled 2015-03-21 (×2): qty 1

## 2015-03-21 MED ORDER — FENTANYL CITRATE (PF) 100 MCG/2ML IJ SOLN
INTRAMUSCULAR | Status: AC | PRN
  Administered 2015-03-21: 50 ug via INTRAVENOUS

## 2015-03-21 MED ORDER — METOPROLOL TARTRATE 25 MG PO TABS
12.5000 mg | ORAL_TABLET | Freq: Two times a day (BID) | ORAL | Status: DC
  Administered 2015-03-21 – 2015-03-24 (×6): 12.5 mg via ORAL
  Filled 2015-03-21 (×10): qty 1

## 2015-03-21 NOTE — Procedures (Signed)
Procedure and risks discussed with patient and wife. Informed consent obtained. Will perform CT-guided right lung biopsy.

## 2015-03-21 NOTE — Procedures (Signed)
Under CT guidance, biopsy of RUL lung mass was performed. Patient immediately developed large right pneumothorax.  Under CT guidance, 8.78F catheter was placed in right midaxillary line into right pleural space. F/U CT showed significant improvement in pneumothorax.

## 2015-03-21 NOTE — Progress Notes (Signed)
  Oncology Nurse Navigator Documentation    Navigator Encounter Type: Clinic/MDC (03/21/15 1100) Patient Visit Type: Radonc;Surgery (03/21/15 1100)       Interventions: Coordination of Care (03/21/15 1100)            Time Spent with Patient: 42 (03/21/15 1100)   Met with patient at followup appointment with Dr. Genevive Bi and consult for Dr. Baruch Gouty. Will follow with planned biopsy on 03/21/15 and anticipated radiation treatment.

## 2015-03-21 NOTE — Progress Notes (Signed)
Seen by Dr. Genevive Bi.  Report called to Leigh-RN on 2C.  Transferred via stretcher to room 205.

## 2015-03-21 NOTE — Sedation Documentation (Signed)
Pneumothorax-post-biopsy.

## 2015-03-21 NOTE — Sedation Documentation (Signed)
8.5 FR pigtail catheter inserted.  Paged Dr. Genevive Bi.

## 2015-03-21 NOTE — H&P (Signed)
Patient ID: Joseph Graham, male   DOB: 1941-05-15, 74 y.o.   MRN: [id number]  No chief complaint on file.   HPI Joseph Graham is a 74 y.o. male.  Had a RUL mass biopsied today and suffered an iatrogenic pneumothorax.  Perc drain placed without problems.  Currently admitted for management of iatrogenic pneumothorax.  Not short of breath.  No pain except with deep inspiration.  No hemoptysis.  No fever at home.     Past Medical History  Diagnosis Date  . Colon polyps     next colonoscopy due around 2018  . HTN (hypertension)   . Alcohol abuse, in remission     remote  . Cardiac arrhythmia 03/2010    h/o a flutter and CM, s/p ablation, normal stress test  . Benign essential tremor     improved on metoprolol and gabapentin  . Dyslipidemia     mild off meds  . Chronic low back pain     MRI 2004, multilevel DDD  . BPH (benign prostatic hypertrophy) 12/30/2012  . Personal history of tobacco use, presenting hazards to health 02/18/2015    quit 06/2011, restarted 2014  . Multiple pulmonary nodules 02/2015  . COPD (chronic obstructive pulmonary disease) ([place])   . GERD (gastroesophageal reflux disease)   . Arthritis     BACK AND NECK  . Complication of anesthesia     DID NOT GET COMPLETELY NUMB WITH TONSILLECTOMY  . Shortness of breath dyspnea     OCC WITH EXERTION    Past Surgical History  Procedure Laterality Date  . Hemorroidectomy  1982  . A flutter ablation  03/2010  . Cataract extraction    . Carotid US  03/2010    WNL  . Colonoscopy  09/2011    polyps, rpt due 5 yrs, mild diverticulosis [name])  . Tonsillectomy  1964  . Electromagnetic navigation brochoscopy N/A 03/02/2015    Procedure: ELECTROMAGNETIC NAVIGATION BRONCHOSCOPY;  Surgeon: [name], MD;  Location: [facility] ORS;  Service: Cardiopulmonary;  Laterality: N/A;    Family History  Problem Relation Age of Onset  . Cervical cancer Mother 64  . Hypertension Father   . Stroke Brother 42  . Tremor Father     and  several uncles/aunts (not parkinson's)  . Bladder Cancer Brother 20  . Prostate cancer Neg Hx   . Kidney cancer Neg Hx     Social History Social History  Substance Use Topics  . Smoking status: Former Smoker -- 1.50 packs/day for 61 years    Types: Cigarettes  . Smokeless tobacco: Never Used     Comment: cut back 0.5 cigarettes--stopped 03/07/15; now now chantix  . Alcohol Use: No     Comment: has not drank in 37 years    No Known Allergies  Current Outpatient Prescriptions  Medication Sig Dispense Refill  . aspirin EC 81 MG tablet Take 81 mg by mouth daily.    . famotidine (PEPCID) 10 MG tablet Take 10 mg by mouth as needed for heartburn or indigestion.    . gabapentin (NEURONTIN) 100 MG capsule TAKE 1 CAPSULE BY MOUTH TWICE A DAY AS NEEDED (Patient taking differently: Take 100 mg by mouth 2 (two) times daily. TAKE 1 CAPSULE BY MOUTH TWICE A DAY) 180 capsule 3  . lisinopril (PRINIVIL,ZESTRIL) 10 MG tablet Take 1 tablet (10 mg total) by mouth daily. (Patient taking differently: Take 10 mg by mouth every evening. ) 90 tablet 3  . metoprolol tartrate (LOPRESSOR) 25 MG  tablet Take 0.5 tablets (12.5 mg total) by mouth 2 (two) times daily. 90 tablet 3  . Multiple Vitamins-Minerals (MULTIVITAMIN PO) Take 1 tablet by mouth daily.    . Naproxen Sodium (ALEVE) 220 MG CAPS Take by mouth 2 (two) times daily.    . rosuvastatin (CRESTOR) 5 MG tablet Take 1 tablet (5 mg total) by mouth daily. 90 tablet 3  . varenicline (CHANTIX CONTINUING MONTH PAK) 1 MG tablet Take 1 tablet (1 mg total) by mouth 2 (two) times daily. (Patient not taking: Reported on 03/21/2015) 60 tablet 6   Current Facility-Administered Medications  Medication Dose Route Frequency Provider Last Rate Last Dose  . 0.9 %  sodium chloride infusion   Intravenous Continuous Hedy Jacob, PA-C 10 mL/hr at 03/21/15 1140    . HYDROcodone-acetaminophen (NORCO/VICODIN) 5-325 MG per tablet 1-2 tablet  1-2 tablet Oral Q4H PRN Sabino Dick,  MD   1 tablet at 03/21/15 1406  . HYDROcodone-acetaminophen (NORCO/VICODIN) 5-325 MG per tablet             Location, Quality, Duration, Severity, Timing, Context, Modifying Factors, Associated Signs and Symptoms.  Review of Systems A complete review of systems was asked and was negative except for the following positive findings pain with deep inspiration.    Blood pressure 151/67, pulse 66, temperature 98.2 F (36.8 C), temperature source Oral, resp. rate 21, height '5\' 8"'$  (1.727 m), weight 205 lb (92.987 kg), SpO2 98 %.  Physical Exam CONSTITUTIONAL:  Pleasant, well-developed, well-nourished, and in no acute distress. EYES: Pupils equal and reactive to light, Sclera non-icteric EARS, NOSE, MOUTH AND THROAT:  The oropharynx was clear.  Dentition is good repair.  Oral mucosa pink and moist. LYMPH NODES:  Lymph nodes in the neck and axillae were normal RESPIRATORY:  Lungs were clear.  Normal respiratory effort without pathologic use of accessory muscles of respiration CARDIOVASCULAR: Heart was regular without murmurs.  There were no carotid bruits. GI: The abdomen was soft, nontender, and nondistended. There were no palpable masses. There was no hepatosplenomegaly. There were normal bowel sounds in all quadrants. MUSCULOSKELETAL:  Normal muscle strength and tone.  No clubbing or cyanosis.   SKIN:  There were no pathologic skin lesions.  There were no nodules on palpation. PSYCH:  Oriented to person, place and time.  Mood and affect are normal.  Moderate sized air leak with expiration.  Data Reviewed CT of chest  I have personally reviewed the patient's imaging and medical records.    Assessment    Iatrogenic pneumothorax after righ upper lobe mass biopsy    Plan    Admit to hospital Repeat CXray in AM  Monitor oxygen sats and air leak     [name], MD 03/21/2015, 2:12 PM

## 2015-03-22 ENCOUNTER — Inpatient Hospital Stay: Payer: Medicare Other

## 2015-03-22 DIAGNOSIS — J95811 Postprocedural pneumothorax: Principal | ICD-10-CM

## 2015-03-22 LAB — CBC
HCT: 40 % (ref 40.0–52.0)
Hemoglobin: 13.6 g/dL (ref 13.0–18.0)
MCH: 31.9 pg (ref 26.0–34.0)
MCHC: 33.9 g/dL (ref 32.0–36.0)
MCV: 94.4 fL (ref 80.0–100.0)
PLATELETS: 221 10*3/uL (ref 150–440)
RBC: 4.24 MIL/uL — ABNORMAL LOW (ref 4.40–5.90)
RDW: 13.7 % (ref 11.5–14.5)
WBC: 9.5 10*3/uL (ref 3.8–10.6)

## 2015-03-22 LAB — COMPREHENSIVE METABOLIC PANEL
ALBUMIN: 4.2 g/dL (ref 3.5–5.0)
ALT: 29 U/L (ref 17–63)
ANION GAP: 8 (ref 5–15)
AST: 29 U/L (ref 15–41)
Alkaline Phosphatase: 80 U/L (ref 38–126)
BUN: 33 mg/dL — ABNORMAL HIGH (ref 6–20)
CO2: 28 mmol/L (ref 22–32)
Calcium: 9 mg/dL (ref 8.9–10.3)
Chloride: 101 mmol/L (ref 101–111)
Creatinine, Ser: 0.93 mg/dL (ref 0.61–1.24)
GFR calc non Af Amer: >60 mL/min (ref >60)
GLUCOSE: 106 mg/dL — AB (ref 65–99)
POTASSIUM: 4.5 mmol/L (ref 3.5–5.1)
SODIUM: 137 mmol/L (ref 135–145)
Total Bilirubin: 0.6 mg/dL (ref 0.3–1.2)
Total Protein: 7.5 g/dL (ref 6.5–8.1)

## 2015-03-22 MED ORDER — NAPROXEN 500 MG PO TABS
500.0000 mg | ORAL_TABLET | Freq: Every day | ORAL | Status: DC
  Administered 2015-03-22: 500 mg via ORAL
  Filled 2015-03-22: qty 1

## 2015-03-22 MED ORDER — MIDAZOLAM HCL 2 MG/2ML IJ SOLN
INTRAMUSCULAR | Status: AC
  Filled 2015-03-22: qty 2

## 2015-03-22 MED ORDER — HEPARIN (PORCINE) IN NACL 2-0.9 UNIT/ML-% IJ SOLN
INTRAMUSCULAR | Status: AC
  Filled 2015-03-22: qty 500

## 2015-03-22 MED ORDER — LIDOCAINE HCL (PF) 1 % IJ SOLN
INTRAMUSCULAR | Status: AC
  Filled 2015-03-22: qty 30

## 2015-03-22 MED ORDER — FENTANYL CITRATE (PF) 100 MCG/2ML IJ SOLN
INTRAMUSCULAR | Status: AC
  Filled 2015-03-22: qty 2

## 2015-03-22 MED ORDER — FENTANYL CITRATE (PF) 100 MCG/2ML IJ SOLN
INTRAMUSCULAR | Status: AC | PRN
  Administered 2015-03-22: 25 ug via INTRAVENOUS
  Administered 2015-03-22: 50 ug via INTRAVENOUS

## 2015-03-22 MED ORDER — MIDAZOLAM HCL 2 MG/2ML IJ SOLN
INTRAMUSCULAR | Status: AC | PRN
  Administered 2015-03-22: 1 mg via INTRAVENOUS

## 2015-03-22 MED ORDER — NAPROXEN 500 MG PO TABS
500.0000 mg | ORAL_TABLET | Freq: Two times a day (BID) | ORAL | Status: DC
  Administered 2015-03-23 – 2015-03-24 (×3): 500 mg via ORAL
  Filled 2015-03-22 (×5): qty 1

## 2015-03-22 NOTE — Procedures (Signed)
Exchange and reposition R chest tube with 51fpigtail under fluoro  No complication No blood loss. [facility]

## 2015-03-22 NOTE — Progress Notes (Signed)
RN received call from radiologist stating that chest tube is almost out and the PTX is stable.  Dr. Genevive Bi informed and is now at pt bedside to assess.    Pt asymptomatic.  Will continue to monitor.

## 2015-03-22 NOTE — Progress Notes (Signed)
Joseph Graham Inpatient Post-Op Note  Patient ID: Joseph Graham, male   DOB: [birth year], 74 y.o.   MRN: [id number]  HISTORY: Quiet night.  Not short of breath.  Minimal pain.   Filed Vitals:   03/22/15 0950  BP: 143/57  Pulse:   Temp:   Resp:      EXAM: Resp: Lungs are clear bilaterally.  No respiratory distress, normal effort. Heart:  Regular without murmurs Abd:  Abdomen is soft, non distended and non tender. No masses are palpable.  There is no rebound and no guarding.  Neurological: Alert and oriented to person, place, and time. Coordination normal.  Skin: Skin is warm and dry. No rash noted. No diaphoretic. No erythema. No pallor.  Psychiatric: Normal mood and affect. Normal behavior. Judgment and thought content normal.    ASSESSMENT: Pneumothorax after lung biopsy.  Independent review of CXRay from this morning shows a moderate sized pneumothorax with the chest tube pulled back significantly but still in the pleural space.  The patient continues to have a moderate air leak   PLAN:   Discussed with Interventional Radiology who have agreed to replace the chest tube.  We will keep NPO for now until chest tube is replaced.   Continue to monitor air leak  Check on Pathology      [name], MD

## 2015-03-22 NOTE — OR Nursing (Signed)
Initially, upon transfer to [facility] from [facility]. Room, sat were 87-88 placed in O2 at 1 1/2 liters NP. Sats improved to 92-93. O2 stopped  Now with Sats on RA being stable at 90%

## 2015-03-23 ENCOUNTER — Inpatient Hospital Stay: Payer: Medicare Other

## 2015-03-23 MED ORDER — ALBUTEROL SULFATE (2.5 MG/3ML) 0.083% IN NEBU
2.5000 mg | INHALATION_SOLUTION | Freq: Three times a day (TID) | RESPIRATORY_TRACT | Status: DC
  Administered 2015-03-23 – 2015-03-24 (×2): 2.5 mg via RESPIRATORY_TRACT
  Filled 2015-03-23 (×2): qty 3

## 2015-03-23 NOTE — Progress Notes (Signed)
[facility] Inpatient Post-Op Note  Patient ID: VA BROADWELL, male   DOB: Apr 29, 1941, 74 y.o.   MRN: [id number]  HISTORY: No new problems.  Not short of breath.  Eating well.  No fever.   Filed Vitals:   03/23/15 0431  BP: 136/52  Pulse: 64  Temp: 97.9 F (36.6 C)  Resp: 18     EXAM: Resp: Lungs are clear bilaterally.  No respiratory distress, normal effort. Heart:  Regular without murmurs Abd:  Abdomen is soft, non distended and non tender. No masses are palpable.  There is no rebound and no guarding.  Neurological: Alert and oriented to person, place, and time. Coordination normal.  Skin: Skin is warm and dry. No rash noted. No diaphoretic. No erythema. No pallor.  Psychiatric: Normal mood and affect. Normal behavior. Judgment and thought content normal.    ASSESSMENT: Independent review of CXRay shows small pneumothorax with repositioning of the chest tube more laterally on the film.  There is still a small air leak present.     PLAN:   I will reduce the suction from 20 to 10 and repeat the chest XRay later today Will discontinue telemetry    [name], MD

## 2015-03-23 NOTE — Progress Notes (Signed)
Pt with home meds in his room. He states that he wants to take his own meds because he can administer them at the time which he gets them at home. Per day shift nurse yesterday, pharmacist adjusted times to meet pt's needs but pt still wants to take his own meds from home.  Counted all medications with pt present. A total of 6 bottles are in pt's possession. (See receipt of pt's home medications in chart). Pt refused to allow nurse to count 2 of the bottles stating that his wife will be taking these home today.   Charge nurse & House supervisor made aware.

## 2015-03-23 NOTE — Care Management Important Message (Signed)
Important Message  Patient Details  Name: Joseph Graham MRN: [id number] Date of Birth: 08/10/1940   Medicare Important Message Given:  Yes-second notification given    Alvie Heidelberg, RN 03/23/2015, 11:10 AM

## 2015-03-23 NOTE — Care Management (Signed)
Spoke with patient. Alert and oriented from home with spouse and independent. No CM needs identified.

## 2015-03-24 MED ORDER — ALBUTEROL SULFATE (2.5 MG/3ML) 0.083% IN NEBU: 2.5000 mg | INHALATION_SOLUTION | Freq: Four times a day (QID) | RESPIRATORY_TRACT | Status: DC | PRN

## 2015-03-24 NOTE — Progress Notes (Signed)
Joseph Graham Inpatient Post-Op Note  Patient ID: Joseph Graham, male   DOB: March 13, 1941, 74 y.o.   MRN: [id number]  HISTORY: No problems.  Eating well.  Not short of breath.  No pain.   Filed Vitals:   03/24/15 0450  BP: 131/50  Pulse: 63  Temp: 97.9 F (36.6 C)  Resp: 16     EXAM: Resp: Lungs are clear bilaterally.  No respiratory distress, normal effort. Heart:  Regular without murmurs Neurological: Alert and oriented to person, place, and time. Coordination normal.  Skin: Skin is warm and dry. No rash noted. No diaphoretic. No erythema. No pallor.  Psychiatric: Normal mood and affect. Normal behavior. Judgment and thought content normal.    ASSESSMENT: Still has a small air leak.   Path pending   PLAN:   Will leave chest tube to water seal Will check on pathology Repeat CXRay in the morning    [name], MD

## 2015-03-25 DIAGNOSIS — R918 Other nonspecific abnormal finding of lung field: Secondary | ICD-10-CM

## 2015-03-25 MED ORDER — DIPHENOXYLATE-ATROPINE 2.5-0.025 MG PO TABS
1.0000 | ORAL_TABLET | Freq: Four times a day (QID) | ORAL | Status: DC | PRN
  Administered 2015-03-25: 1 via ORAL
  Filled 2015-03-25: qty 1

## 2015-03-25 NOTE — Progress Notes (Signed)
Patient's home medications have been stored in the pharmacy.  Per the patient, he was under the impression that nursing would be administering the medications that he brought in from home.  Per the pharmacy, nursing can not administer the patient's medications that he brought in from home.  Any home medications would have to be sent home or stored in the pharmacy as they had been.  By request, a staff member from the pharmacy came up to speak with the patient about this.  He has demanded that his medications be returned to him.  His meds have been returned.  He did not want to count the gabapentin and the aleve as there were more than 100 tablets/capsules.  There were only 11 half tablets of the metoprolol but the initial count was 12.  Currently Joseph Graham is refusing to take the medications that are ordered and dispensed from the hospital pyxis supply.  He also states that he will send his medications home with his wife.  Unit director and doctor Genevive Bi has been notified of this.

## 2015-03-25 NOTE — Progress Notes (Signed)
Joseph Graham Inpatient Post-Op Note  Patient ID: Joseph Graham, male   DOB: 06/13/1940, 73 y.o.   MRN: [id number]  HISTORY: No new complaints overnight. He states he feels well overall. He is been eating. He is not short of breath and has no pain.   Filed Vitals:   03/25/15 0555  BP: 139/55  Pulse: 65  Temp: 97.8 F (36.6 C)  Resp: 16     EXAM: Resp: Lungs are clear bilaterally.  No respiratory distress, normal effort. Heart:  Regular without murmurs Abd:  Abdomen is soft, non distended and non tender. No masses are palpable.  There is no rebound and no guarding.  Neurological: Alert and oriented to person, place, and time. Coordination normal.  Skin: Skin is warm and dry. No rash noted. No diaphoretic. No erythema. No pallor.  Psychiatric: Normal mood and affect. Normal behavior. Judgment and thought content normal.    ASSESSMENT: He continues to have a small to moderate sized air leak. I did discuss with him the possibility of managing this with a Heimlich valve and chest tube at home. He's open to the idea but would like to have this resolved prior to his discharge. I also reviewed with him the results of the pathology which would suggest metastatic bladder cancer. I did discuss these results with Dr. Welford Roche as an pathology and she believes that bladder cancer is most likely diagnosis.   PLAN:   We will continue to observe and perhaps manage the patient as an outpatient with a Heimlich valve. I've also discussed the results of the pathology with the patient he will need to see our oncologist as well. We will go ahead and set that up prior to his discharge.    [name], MD

## 2015-03-25 NOTE — Care Management Important Message (Signed)
Important Message  Patient Details  Name: Joseph Graham MRN: [id number] Date of Birth: [birth year]   Medicare Important Message Given:  Yes-third notification given    [name] 03/25/2015, 1:40 PM

## 2015-03-25 NOTE — Progress Notes (Signed)
Called to see patient for diarrhea  Discussed with patient. 4 episodes today of loose stools. No fevers or chills, no abdominal cramping, no abdominal pain, tolerating diet.   Will provide a trial of Lomotil. Discussed with patient and nurse that should it persist would then test for C. Diff. Both voiced understanding.  Clayburn Pert, MD FACS General Surgeon [facility]

## 2015-03-25 NOTE — Progress Notes (Signed)
Patient A/O, no noted distress. Did not awaken patient during the night as requested. Staff will continue to monitor and meet needs.

## 2015-03-26 MED ORDER — LOPERAMIDE HCL 2 MG PO CAPS: 2.0000 mg | ORAL_CAPSULE | Freq: Three times a day (TID) | ORAL | Status: DC | PRN

## 2015-03-26 NOTE — Progress Notes (Signed)
Pt alert and oriented. IV site removed. Concerns addressed. Heimlich valve and dependent bag attached, as given by MD. Dressing to right chest reinforced with gauze and tape. No signs of distress. Discharge summary given to patient.

## 2015-03-26 NOTE — Care Management Note (Signed)
Case Management Note  Patient Details  Name: Joseph Graham MRN: [id number] Date of Birth: 1940-07-13  Subjective/Objective:      Discussed no home health orders with Rodena Piety, RN.               Action/Plan:   Expected Discharge Date:                  Expected Discharge Plan:     In-House Referral:     Discharge planning Services     Post Acute Care Choice:    Choice offered to:     DME Arranged:    DME Agency:     HH Arranged:    [place] Agency:     Status of Service:     Medicare Important Message Given:  Yes-third notification given Date Medicare IM Given:    Medicare IM give by:    Date Additional Medicare IM Given:    Additional Medicare Important Message give by:     If discussed at [place] of Stay Meetings, dates discussed:    Additional Comments:  [name] A, RN 03/26/2015, 12:48 PM

## 2015-03-26 NOTE — Discharge Instructions (Signed)
Heimlich Valve Home Care A Heimlich valve is a device that helps to drain extra air and fluid from the chest cavity. It connects to a chest tube that is inserted into the area surrounding your lungs (pleural space). The valve lets the extra air out of the chest cavity and stops new air from getting in through the chest tube. The other end of the Heimlich valve is sometimes connected to a drainage bag or container used to collect fluid that drains from the chest cavity.  A Heimlich valve is often used to manage pneumothorax, a condition in which extra air leaks into the pleuralspace and prevents the lungs from fully expanding. This valve may also be used to help drain any leakage of air or fluid after surgery involving the lungs.  If you are going home with a chest tube and Heimlich valve in place, follow the instructions below. HOW TO CARE FOR THE VALVE AND CHEST TUBE  Keep the valve and tube secured to your skin as directed by your health care provider. This prevents kinks from developing in the tube and prevents the tube from being pulled out of place.  Make sure the valve opening is always clear so that air can escape. Do not block the opening with anything (such as tape) that would stop the flow of air. If the valve is connected to a drainage bag or container, make sure the air vent for the bag or container does not become blocked.   Keep in mind that you will hear the valve make noises as air or fluid passes through it.  Wear loose, comfortable clothing.   Remove or change bandages (dressings) around the insertion site only as directed by your health care provider.  Gently clean the skin near the insertion site with mild soap and water as directed by your health care provider. Avoid powders, creams, and strong soaps.  Do not take baths, swim, or use a hot tub until your health care provider approves. Take showers instead. When you shower, make sure the tube, valve, and dressings are covered  with a waterproof covering.  If you have a drainage bag or container, follow your health care provider's instructions for keeping track of the amount of fluid that drains. You may be asked to write down the day, time, and amount of fluid each time you empty the drainage bag or container.  Empty the fluid from the drainage bag or container into the toilet as directed by your health care provider.  Limit activities as directed by your health care provider. You may need to avoid strenuous activity and heavy lifting for several weeks.  Avoid air travel until your health care provider says it is okay.   Keep all follow-up visits as directed by your health care provider. This is important.  SEEK MEDICAL CARE IF:  You are having trouble caring for your chest tube and Heimlich valve.   Your Heimlich valve becomes disconnected from the chest tube. If this occurs, reconnect the valve and call your health care provider.  Your chest tube comes loose or gets pulled out.   You have a fever.   You have chills.   You have drainage, redness, swelling, or pain at the tube insertion site.   You have pain, pressure, or cramping in the chest.   You have continued shortness of breath.   You have other new symptoms.  SEEK IMMEDIATE MEDICAL CARE IF:  You have sudden, severe chest pain.   You have  trouble breathing.    Chest Tube A chest tube is a small, flexible drainage tube that is put into the chest. The tube drains fluid, blood, or extra air that has built up between the lungs and the inside of the chest wall (pleural space). Fluid or air can build up in this area for various reasons. When this occurs, it can prevent the lung from expanding completely and cause breathing problems. This can be dangerous. The chest tube allows the lung to re-expand. The procedure to put in the chest tube involves inserting the tube through the skin between the ribs and into the pleural space. LET [facility] CARE PROVIDER KNOW ABOUT:   Any allergies you have.  All medicines you are taking, including vitamins, herbs, eye drops, creams, and over-the-counter medicines.  Previous problems you or members of your family have had with the use of anesthetics.  Any blood disorders you have.  Previous surgeries you have had.  Medical conditions you have. RISKS AND COMPLICATIONS Generally, this is a safe procedure. However, as with any procedure, problems can occur. Possible problems include:  Bleeding.  Injury to the lung.  Infection.  Chest tube failing to work properly, usually due to leaking of air around the tube, or tube positioning in a place where all of the fluid or air cannot be drained.  Problems related to the use of anesthetics or pain medicines. BEFORE THE PROCEDURE  Ask your health care provider if there are any special preparatory instructions such as not eating before the procedure. Follow these instructions exactly. PROCEDURE  The area where the chest tube will be inserted is numbed with a medicine (local anesthetic). You may be given medicine for pain and medicine to help you relax (sedative). An incision is made between the ribs, and a small opening is made through the inner lining of the chest wall. The chest tube is inserted through this opening and into the pleural space. The other end of the chest tube may be connected to a plastic container that collects the fluid drained from the pleural space and has sterile water to make a one-way seal, or "water seal," that prevents air from going back into the pleural space. Suction is sometimes attached to the system for drainage. A stitch (suture) or tape is used to keep the tube in place.  AFTER THE PROCEDURE  A chest X-ray will be done to check the position of the chest tube. You will be monitored for breathing difficulties, air leaks in the chest tube, and the need for additional oxygen. You will be encouraged to breathe  deeply. You may be given antibiotic medicine to prevent or treat infection. The chest tube will stay in place until all the extra air or fluid has drained from the chest. You will likely need to stay in the hospital until the chest tube is removed. In rare cases, you may go home with the chest tube in place.   This information is not intended to replace advice given to you by your health care provider. Make sure you discuss any questions you have with your health care provider.   Document Released: 09/05/2006 Document Revised: 06/02/2013 Document Reviewed: 12/03/2012 Elsevier Interactive Patient Education [employer].  This information is not intended to replace advice given to you by your health care provider. Make sure you discuss any questions you have with your health care provider.   Document Released: 12/23/2013 Document Reviewed: 12/23/2013 Elsevier Interactive Patient Education [employer].

## 2015-03-28 ENCOUNTER — Other Ambulatory Visit: Payer: Self-pay | Admitting: Surgery

## 2015-03-28 ENCOUNTER — Ambulatory Visit
Admission: RE | Admit: 2015-03-28 | Discharge: 2015-03-28 | Disposition: A | Discharge status: Home / Self Care | Payer: Medicare Other | Source: Ambulatory Visit | Attending: Surgery | Admitting: Surgery

## 2015-03-28 ENCOUNTER — Ambulatory Visit (INDEPENDENT_AMBULATORY_CARE_PROVIDER_SITE_OTHER): Payer: Medicare Other | Admitting: Urology

## 2015-03-28 ENCOUNTER — Ambulatory Visit: Payer: Medicare Other

## 2015-03-28 ENCOUNTER — Telehealth: Payer: Self-pay | Admitting: Radiology

## 2015-03-28 ENCOUNTER — Ambulatory Visit (INDEPENDENT_AMBULATORY_CARE_PROVIDER_SITE_OTHER): Payer: Self-pay | Admitting: Surgery

## 2015-03-28 ENCOUNTER — Telehealth: Payer: Self-pay

## 2015-03-28 ENCOUNTER — Encounter: Payer: Self-pay | Admitting: Surgery

## 2015-03-28 ENCOUNTER — Other Ambulatory Visit: Payer: Self-pay | Admitting: *Deleted

## 2015-03-28 VITALS — BP 162/67 | HR 73 | Temp 98.0°F | Ht 69.0 in | Wt 212.0 lb

## 2015-03-28 VITALS — BP 145/72 | HR 73 | Ht 68.0 in | Wt 211.2 lb

## 2015-03-28 DIAGNOSIS — J984 Other disorders of lung: Secondary | ICD-10-CM | POA: Diagnosis not present

## 2015-03-28 DIAGNOSIS — J9383 Other pneumothorax: Secondary | ICD-10-CM | POA: Diagnosis not present

## 2015-03-28 DIAGNOSIS — J95811 Postprocedural pneumothorax: Secondary | ICD-10-CM | POA: Diagnosis not present

## 2015-03-28 DIAGNOSIS — C3490 Malignant neoplasm of unspecified part of unspecified bronchus or lung: Secondary | ICD-10-CM | POA: Diagnosis not present

## 2015-03-28 DIAGNOSIS — R3129 Other microscopic hematuria: Secondary | ICD-10-CM | POA: Diagnosis not present

## 2015-03-28 DIAGNOSIS — R911 Solitary pulmonary nodule: Secondary | ICD-10-CM

## 2015-03-28 DIAGNOSIS — N3289 Other specified disorders of bladder: Secondary | ICD-10-CM

## 2015-03-28 DIAGNOSIS — J939 Pneumothorax, unspecified: Secondary | ICD-10-CM

## 2015-03-28 DIAGNOSIS — Z4682 Encounter for fitting and adjustment of non-vascular catheter: Secondary | ICD-10-CM | POA: Diagnosis not present

## 2015-03-28 LAB — URINALYSIS, COMPLETE
BILIRUBIN UA: NEGATIVE
Glucose, UA: NEGATIVE
LEUKOCYTES UA: NEGATIVE
Nitrite, UA: NEGATIVE
UUROB: 0.2 mg/dL (ref 0.2–1.0)
pH, UA: 5.5 (ref 5.0–7.5)

## 2015-03-28 LAB — MICROSCOPIC EXAMINATION

## 2015-03-28 NOTE — Patient Instructions (Signed)
Your next appointment is 04/01/15 at 36 AM in the [place] office. Go to radiology at 0800 AM that same morning for a chest xray. Please call our office if you have any questions.

## 2015-03-28 NOTE — Progress Notes (Signed)
03/28/2015 11:41 AM   [name] 1941/02/27 [id number]  Referring provider: [name], MD [address] [place], [place] [address]  Chief Complaint  Patient presents with  . Discuss Surgery    HPI: The patient is a 74 year old gentleman who has both lung and bladder lesions. He had a 2-3 cm exophytic lesion in the midline posterior bladder wall superior to the trigone during office cystoscopy at his last visit. At that time, there was more concern about his lung lesion, so we planned to have his lung lesion addressed first with the TURBT to follow lung surgery . He had a lung biopsy which showed poorly differentiated carcinoma of unclear origin. There is concern it may be metastasis from the bladder, so he was sent back now for resection of his bladder tumor as well as to get a tissue specimen to determine the next step in his treatment.   PMH: Past Medical History  Diagnosis Date  . Colon polyps     next colonoscopy due around 2018  . HTN (hypertension)   . Alcohol abuse, in remission     remote  . Cardiac arrhythmia 03/2010    h/o a flutter and CM, s/p ablation, normal stress test  . Benign essential tremor     improved on metoprolol and gabapentin  . Dyslipidemia     mild off meds  . Chronic low back pain     MRI 2004, multilevel DDD  . BPH (benign prostatic hypertrophy) 12/30/2012  . Personal history of tobacco use, presenting hazards to health 02/18/2015    quit 06/2011, restarted 2014  . Multiple pulmonary nodules 02/2015  . COPD (chronic obstructive pulmonary disease) ([place])   . GERD (gastroesophageal reflux disease)   . Arthritis     BACK AND NECK  . Complication of anesthesia     DID NOT GET COMPLETELY NUMB WITH TONSILLECTOMY  . Shortness of breath dyspnea     OCC WITH EXERTION    Surgical History: Past Surgical History  Procedure Laterality Date  . Hemorroidectomy  1982  . A flutter ablation  03/2010  . Cataract extraction    . Carotid US   03/2010    WNL  . Colonoscopy  09/2011    polyps, rpt due 5 yrs, mild diverticulosis [name])  . Tonsillectomy  1964  . Electromagnetic navigation brochoscopy N/A 03/02/2015    Procedure: ELECTROMAGNETIC NAVIGATION BRONCHOSCOPY;  Surgeon: [name], MD;  Location: [facility] ORS;  Service: Cardiopulmonary;  Laterality: N/A;    Home Medications:    Medication List       This list is accurate as of: 03/28/15 11:41 AM.  Always use your most recent med list.               famotidine 10 MG tablet  Commonly known as:  PEPCID  Take 10 mg by mouth as needed for heartburn or indigestion.     gabapentin 100 MG capsule  Commonly known as:  NEURONTIN  TAKE 1 CAPSULE BY MOUTH TWICE A DAY AS NEEDED     lisinopril 10 MG tablet  Commonly known as:  PRINIVIL,ZESTRIL  Take 1 tablet (10 mg total) by mouth daily.     loperamide 2 MG capsule  Commonly known as:  IMODIUM  Take 1 capsule (2 mg total) by mouth 3 (three) times daily as needed for diarrhea or loose stools.     metoprolol tartrate 25 MG tablet  Commonly known as:  LOPRESSOR  Take 0.5 tablets (  12.5 mg total) by mouth 2 (two) times daily.     rosuvastatin 5 MG tablet  Commonly known as:  CRESTOR  Take 1 tablet (5 mg total) by mouth daily.        Allergies: No Known Allergies  Family History: Family History  Problem Relation Age of Onset  . Cervical cancer Mother 70  . Hypertension Father   . Stroke Brother 19  . Tremor Father     and several uncles/aunts (not parkinson's)  . Bladder Cancer Brother 95  . Prostate cancer Neg Hx   . Kidney cancer Neg Hx     Social History:  reports that he has quit smoking. His smoking use included Cigarettes. He has a 91.5 pack-year smoking history. He has never used smokeless tobacco. He reports that he does not drink alcohol or use illicit drugs.  ROS:                                        Physical Exam: BP 145/72 mmHg  Pulse 73  Ht '5\' 8"'$  (1.727 m)  Wt  211 lb 3.2 oz (95.8 kg)  BMI 32.12 kg/m2  Constitutional:  Alert and oriented, No acute distress. HEENT: [place] AT, moist mucus membranes.  Trachea midline, no masses. Cardiovascular: No clubbing, cyanosis, or edema. Respiratory: Normal respiratory effort, no increased work of breathing. GI: Abdomen is soft, nontender, nondistended, no abdominal masses GU: No CVA tenderness.  Skin: No rashes, bruises or suspicious lesions. Lymph: No cervical or inguinal adenopathy. Neurologic: Grossly intact, no focal deficits, moving all 4 extremities. Psychiatric: Normal mood and affect.  Laboratory Data: Lab Results  Component Value Date   WBC 9.5 03/22/2015   HGB 13.6 03/22/2015   HCT 40.0 03/22/2015   MCV 94.4 03/22/2015   PLT 221 03/22/2015    Lab Results  Component Value Date   CREATININE 0.93 03/22/2015    Lab Results  Component Value Date   PSA 0.60 01/24/2015   PSA 0.68 12/31/2013   PSA 0.67 12/30/2012    No results found for: TESTOSTERONE  Lab Results  Component Value Date   HGBA1C 6.3 01/24/2015    Urinalysis    Component Value Date/Time   GLUCOSEU Negative 03/09/2015 1348   BILIRUBINUR Negative 03/09/2015 1348   NITRITE Negative 03/09/2015 1348   LEUKOCYTESUR Negative 03/09/2015 1348    Pathology: Urine only decent as he also the Flomax, and responded  Surgical Pathology  CASE: [phone]  PATIENT: Rachael Fee  Surgical Pathology Report      SPECIMEN SUBMITTED:  A. RUL Lung Mass, biopsy   CLINICAL HISTORY:  74 y.o. male with a history of long-standing smoking/COPD- noted to have  lung nodules x2 right upper lobe on screening CT scan of the lung  September 2016. Possible bladder lesion also noted.   PRE-OPERATIVE DIAGNOSIS:  Multiple pulmonary nodules in the lungs bilaterally - largest in right  upper lobe   POST-OPERATIVE DIAGNOSIS:  Same as pre-op      DIAGNOSIS:  A. LUNG MASS, RIGHT UPPER LOBE; CT-GUIDED BIOPSY:  - POORLY DIFFERENTIATED  CARCINOMA, CANNOT EXCLUDE METASTASES.   Comment:  A panel of immunohistochemical stains was performed with the following  results:  Cytokeratin 7: Positive  Cytokeratin 20: Positive  TT F-1: Negative  Napsin: Negative  P40: Negative  GATA-3: Equivocal, favor negative  Stain controls worked appropriately. The pattern of immunoreactivity  does not lend  support to a particular site of tumor origin. Since a  bladder lesion was identified, recommend urine cytology and cystoscopy.  Preliminary results communicated to Dr. Genevive Bi via [facility] messaging on  03/22/15. Final results were communicated to [facility] in Dr. Soyla Dryer office on  03/24/15.              Assessment & Plan:   On my last cystoscopy, the bladder lesion looked superficial and well-differentiated. Therefore, I feel it is unlikely that this is metastatic disease to his lung from the bladder. We will, however, still plan for a TURBT this week to resect the tumor in his bladder as well as provide a tissue specimen for comparison to his lung lesion.  1. Bladder Lesion TURBT on 03/30/15  2.Microscopic hematuria We will plan for retrograde pyelogram at the time of his TURBT as he had a CT PET which did not have delayed images.  This will complete the hematuria work up.  3.  Poorly differentiated carcinoma of the lung-unclear if metastatic or primary We will be able to better evaluate the origin of the carcinoma after obtaining a tissue specimen during TURBT.   [name], MD  [facility] [address], [place], [place] [address] [phone]

## 2015-03-28 NOTE — Telephone Encounter (Signed)
Notified pt of pre-admit appt on 03/29/15 '@10'$ :00, surgery scheduled 03/30/15 and to call day prior to surgery for arrival time to SDS. Pt advised to be npo after mn day of surgery. Pt verbalizes understanding.

## 2015-03-28 NOTE — Telephone Encounter (Signed)
  Oncology Nurse Navigator Documentation    Navigator Encounter Type: Telephone (03/28/15 1000)         Interventions: Coordination of Care (03/28/15 1000)

## 2015-03-28 NOTE — Progress Notes (Signed)
Outpatient Surgical Follow Up  03/28/2015  Joseph Graham is an 74 y.o. male.   CC:  iatrogenic pneumothorax  HPI:  This patient had a lung biopsy performed by radiology and developed a postprocedure pneumothorax. His biopsy showed lung cancer poorly differentiated. Dr. Faith Rogue has been caring for the patient.  Past Medical History  Diagnosis Date  . Colon polyps     next colonoscopy due around 2018  . HTN (hypertension)   . Alcohol abuse, in remission     remote  . Cardiac arrhythmia 03/2010    h/o a flutter and CM, s/p ablation, normal stress test  . Benign essential tremor     improved on metoprolol and gabapentin  . Dyslipidemia     mild off meds  . Chronic low back pain     MRI 2004, multilevel DDD  . BPH (benign prostatic hypertrophy) 12/30/2012  . Personal history of tobacco use, presenting hazards to health 02/18/2015    quit 06/2011, restarted 2014  . Multiple pulmonary nodules 02/2015  . COPD (chronic obstructive pulmonary disease) ([place])   . GERD (gastroesophageal reflux disease)   . Arthritis     BACK AND NECK  . Complication of anesthesia     DID NOT GET COMPLETELY NUMB WITH TONSILLECTOMY  . Shortness of breath dyspnea     OCC WITH EXERTION    Past Surgical History  Procedure Laterality Date  . Hemorroidectomy  1982  . A flutter ablation  03/2010  . Cataract extraction    . Carotid US  03/2010    WNL  . Colonoscopy  09/2011    polyps, rpt due 5 yrs, mild diverticulosis [name])  . Tonsillectomy  1964  . Electromagnetic navigation brochoscopy N/A 03/02/2015    Procedure: ELECTROMAGNETIC NAVIGATION BRONCHOSCOPY;  Surgeon: [name], MD;  Location: [facility] ORS;  Service: Cardiopulmonary;  Laterality: N/A;    Family History  Problem Relation Age of Onset  . Cervical cancer Mother 52  . Hypertension Father   . Stroke Brother 101  . Tremor Father     and several uncles/aunts (not parkinson's)  . Bladder Cancer Brother 32  . Prostate cancer Neg Hx   .  Kidney cancer Neg Hx     Social History:  reports that he has quit smoking. His smoking use included Cigarettes. He has a 91.5 pack-year smoking history. He has never used smokeless tobacco. He reports that he does not drink alcohol or use illicit drugs.  Allergies: No Known Allergies  Medications reviewed.   Review of Systems:   Review of Systems  Constitutional: Negative.   HENT: Negative.   Eyes: Negative.   Respiratory: Negative for cough, hemoptysis, sputum production, shortness of breath and wheezing.   Cardiovascular: Negative for chest pain, palpitations, orthopnea and leg swelling.  Gastrointestinal: Negative.   Genitourinary: Negative for dysuria and urgency.  Musculoskeletal: Negative.   Skin: Negative.   Neurological: Negative.   Endo/Heme/Allergies: Negative.   Psychiatric/Behavioral: Negative.      Physical Exam:  BP 162/67 mmHg  Pulse 73  Temp(Src) 98 F (36.7 C) (Oral)  Ht '5\' 9"'$  (1.753 m)  Wt 212 lb (96.163 kg)  BMI 31.29 kg/m2  Physical Exam  Constitutional: He is oriented to person, place, and time. No distress.  HENT:  Head: Normocephalic and atraumatic.  Mouth/Throat: No oropharyngeal exudate.  Eyes: Pupils are equal, round, and reactive to light. Right eye exhibits no discharge. Left eye exhibits no discharge. No scleral icterus.  Neck: Normal range of  motion.  Cardiovascular: Normal rate and regular rhythm.   Pulmonary/Chest: Effort normal and breath sounds normal. No respiratory distress. He has no wheezes. He has no rales.  Right chest tube in place with Heimlich valve no obvious air leak  Musculoskeletal: Normal range of motion.  Neurological: He is alert and oriented to person, place, and time.  Skin: Skin is warm and dry.  Psychiatric: Mood, affect and judgment normal.      Results for orders placed or performed in visit on 03/28/15 (from the past 48 hour(s))  Urinalysis, Complete     Status: Abnormal   Collection Time: 03/28/15  11:27 AM  Result Value Ref Range   Specific Gravity, UA >1.030 (H) 1.005 - 1.030   pH, UA 5.5 5.0 - 7.5   Color, UA Yellow Yellow   Appearance Ur Clear Clear   Leukocytes, UA Negative Negative   Protein, UA Trace (A) Negative/Trace   Glucose, UA Negative Negative   Ketones, UA Trace (A) Negative   RBC, UA 2+ (A) Negative   Bilirubin, UA Negative Negative   Urobilinogen, Ur 0.2 0.2 - 1.0 mg/dL   Nitrite, UA Negative Negative   Microscopic Examination See below:   Microscopic Examination     Status: Abnormal   Collection Time: 03/28/15 11:27 AM  Result Value Ref Range   WBC, UA 0-5 0 -  5 /hpf   RBC, UA 11-30 (A) 0 -  2 /hpf   Epithelial Cells (non renal) 0-10 0 - 10 /hpf   Renal Epithel, UA 0-10 (A) None seen /hpf   Mucus, UA Present (A) Not Estab.   Bacteria, UA Moderate (A) None seen/Few   Dg Chest 2 View  03/28/2015  CLINICAL DATA:  Spontaneous pneumothorax status post chest tube EXAM: CHEST  2 VIEW COMPARISON:  03/23/2015 chest radiograph FINDINGS: Right basilar pigtail pleural catheter is in place. Surgical clips overlie the right upper chest. Stable cardiomediastinal silhouette with normal heart size. There is a persistent small right apical pneumothorax, mildly decreased. No left pneumothorax. No pleural effusion. Clear lungs, with no focal lung consolidation and no pulmonary edema. IMPRESSION: Persistent small right apical pneumothorax, mildly decreased. Electronically Signed   By: Ilona Sorrel M.D.   On: 03/28/2015 12:37    Assessment/Plan:  Small apical pneumothorax S x-ray personally reviewed. No obvious air leak on Heimlich valve. Right main continuing the chest tube in place until Friday when an x-ray will be obtained in the morning and follow-up in the [facility] office will be arranged.  Patient has a procedure planned as an outpatient for a bladder tumor this week  Florene Glen, MD, FACS

## 2015-03-29 ENCOUNTER — Encounter
Admission: RE | Admit: 2015-03-29 | Discharge: 2015-03-29 | Disposition: A | Discharge status: Home / Self Care | Payer: Medicare Other | Source: Ambulatory Visit | Attending: Urology | Admitting: Urology

## 2015-03-29 DIAGNOSIS — N329 Bladder disorder, unspecified: Secondary | ICD-10-CM | POA: Diagnosis not present

## 2015-03-29 DIAGNOSIS — E785 Hyperlipidemia, unspecified: Secondary | ICD-10-CM | POA: Diagnosis not present

## 2015-03-29 DIAGNOSIS — N400 Enlarged prostate without lower urinary tract symptoms: Secondary | ICD-10-CM | POA: Diagnosis not present

## 2015-03-29 DIAGNOSIS — G8929 Other chronic pain: Secondary | ICD-10-CM | POA: Diagnosis not present

## 2015-03-29 DIAGNOSIS — Z79899 Other long term (current) drug therapy: Secondary | ICD-10-CM | POA: Diagnosis not present

## 2015-03-29 DIAGNOSIS — I10 Essential (primary) hypertension: Secondary | ICD-10-CM | POA: Diagnosis not present

## 2015-03-29 DIAGNOSIS — C3490 Malignant neoplasm of unspecified part of unspecified bronchus or lung: Secondary | ICD-10-CM | POA: Diagnosis not present

## 2015-03-29 DIAGNOSIS — Z87891 Personal history of nicotine dependence: Secondary | ICD-10-CM | POA: Diagnosis not present

## 2015-03-29 DIAGNOSIS — M545 Low back pain: Secondary | ICD-10-CM | POA: Diagnosis not present

## 2015-03-29 DIAGNOSIS — J449 Chronic obstructive pulmonary disease, unspecified: Secondary | ICD-10-CM | POA: Diagnosis not present

## 2015-03-29 DIAGNOSIS — K219 Gastro-esophageal reflux disease without esophagitis: Secondary | ICD-10-CM | POA: Diagnosis not present

## 2015-03-29 DIAGNOSIS — Z5309 Procedure and treatment not carried out because of other contraindication: Secondary | ICD-10-CM | POA: Diagnosis not present

## 2015-03-29 DIAGNOSIS — G250 Essential tremor: Secondary | ICD-10-CM | POA: Diagnosis not present

## 2015-03-29 NOTE — Patient Instructions (Signed)
  Your procedure is scheduled on: Tomorrow Oct, 19,2016 Report to Same Day Surgery. To find out your arrival time please call [phone] between 1PM - 3PM on Today.  Remember: Instructions that are not followed completely may result in serious medical risk, up to and including death, or upon the discretion of your surgeon and anesthesiologist your surgery may need to be rescheduled.    __x_ 1. Do not eat food or drink liquids after midnight. No gum chewing or hard candies.     ____ 2. No Alcohol for 24 hours before or after surgery.   ____ 3. Bring all medications with you on the day of surgery if instructed.    __x__ 4. Notify your doctor if there is any change in your medical condition     (cold, fever, infections).     Do not wear jewelry, make-up, hairpins, clips or nail polish.  Do not wear lotions, powders, or perfumes. You may wear deodorant.  Do not shave 48 hours prior to surgery. Men may shave face and neck.  Do not bring valuables to the hospital.    [facility] is not responsible for any belongings or valuables.               Contacts, dentures or bridgework may not be worn into surgery.  Leave your suitcase in the car. After surgery it may be brought to your room.  For patients admitted to the hospital, discharge time is determined by your treatment team.   Patients discharged the day of surgery will not be allowed to drive home.    Please read over the following fact sheets that you were given:   [facility] Preparing for Surgery  __x__ Take these medicines the morning of surgery with A SIP OF WATER:    1. famotidine (PEPCID)  2. gabapentin (NEURONTIN)  3. lisinopril (PRINIVIL,ZESTRIL)  4.metoprolol tartrate (LOPRESSOR)  ____ Fleet Enema (as directed)   ____ Use CHG Soap as directed  ____ Use inhalers on the day of surgery  ____ Stop metformin 2 days prior to surgery    ____ Take 1/2 of usual insulin dose the night before surgery and none on the morning  of  surgery.   ____ Stop Coumadin/Plavix/aspirin on does not apply.  _x___ Stop Anti-inflammatories on now.  Tylenol is OK to take for pain.   ____ Stop supplements until after surgery.    ____ Bring C-Pap to the hospital.

## 2015-03-30 ENCOUNTER — Encounter
Admission: RE | Disposition: A | Discharge status: Home / Self Care | Payer: Self-pay | Source: Ambulatory Visit | Attending: Urology

## 2015-03-30 ENCOUNTER — Encounter: Payer: Self-pay | Admitting: *Deleted

## 2015-03-30 ENCOUNTER — Ambulatory Visit
Admission: RE | Admit: 2015-03-30 | Discharge: 2015-03-30 | Disposition: A | Discharge status: Home / Self Care | Payer: Medicare Other | Source: Ambulatory Visit | Attending: Urology | Admitting: Urology

## 2015-03-30 ENCOUNTER — Telehealth: Payer: Self-pay

## 2015-03-30 DIAGNOSIS — G8929 Other chronic pain: Secondary | ICD-10-CM | POA: Diagnosis not present

## 2015-03-30 DIAGNOSIS — Z87891 Personal history of nicotine dependence: Secondary | ICD-10-CM | POA: Diagnosis not present

## 2015-03-30 DIAGNOSIS — E785 Hyperlipidemia, unspecified: Secondary | ICD-10-CM | POA: Diagnosis not present

## 2015-03-30 DIAGNOSIS — Z5309 Procedure and treatment not carried out because of other contraindication: Secondary | ICD-10-CM | POA: Insufficient documentation

## 2015-03-30 DIAGNOSIS — N400 Enlarged prostate without lower urinary tract symptoms: Secondary | ICD-10-CM | POA: Insufficient documentation

## 2015-03-30 DIAGNOSIS — Z79899 Other long term (current) drug therapy: Secondary | ICD-10-CM | POA: Diagnosis not present

## 2015-03-30 DIAGNOSIS — N329 Bladder disorder, unspecified: Secondary | ICD-10-CM | POA: Insufficient documentation

## 2015-03-30 DIAGNOSIS — G250 Essential tremor: Secondary | ICD-10-CM | POA: Diagnosis not present

## 2015-03-30 DIAGNOSIS — C3490 Malignant neoplasm of unspecified part of unspecified bronchus or lung: Secondary | ICD-10-CM | POA: Insufficient documentation

## 2015-03-30 DIAGNOSIS — K219 Gastro-esophageal reflux disease without esophagitis: Secondary | ICD-10-CM | POA: Diagnosis not present

## 2015-03-30 DIAGNOSIS — M545 Low back pain: Secondary | ICD-10-CM | POA: Insufficient documentation

## 2015-03-30 DIAGNOSIS — J449 Chronic obstructive pulmonary disease, unspecified: Secondary | ICD-10-CM | POA: Insufficient documentation

## 2015-03-30 DIAGNOSIS — I10 Essential (primary) hypertension: Secondary | ICD-10-CM | POA: Diagnosis not present

## 2015-03-30 SURGERY — TURBT (TRANSURETHRAL RESECTION OF BLADDER TUMOR)
Anesthesia: Choice

## 2015-03-30 MED ORDER — CIPROFLOXACIN HCL 500 MG PO TABS
ORAL_TABLET | ORAL | Status: AC
  Administered 2015-03-30: 500 mg via ORAL
  Filled 2015-03-30: qty 1

## 2015-03-30 MED ORDER — CIPROFLOXACIN HCL 500 MG PO TABS
500.0000 mg | ORAL_TABLET | Freq: Once | ORAL | Status: AC
  Administered 2015-03-30: 500 mg via ORAL

## 2015-03-30 MED ORDER — LACTATED RINGERS IV SOLN
INTRAVENOUS | Status: DC
  Administered 2015-03-30: 11:00:00 via INTRAVENOUS

## 2015-03-30 SURGICAL SUPPLY — 30 items
BACTOSHIELD CHG 4% 4OZ (MISCELLANEOUS)
BAG DRAIN CYSTO-URO LG1000N (MISCELLANEOUS) IMPLANT
BAG URO DRAIN 2000ML W/SPOUT (MISCELLANEOUS) IMPLANT
CATH FOLEY 2WAY  5CC 16FR (CATHETERS)
CATH URETL 5X70 OPEN END (CATHETERS) IMPLANT
CATH URTH 16FR FL 2W BLN LF (CATHETERS) IMPLANT
CONRAY 43 FOR UROLOGY 50M (MISCELLANEOUS) IMPLANT
CORD URO TURP 10FT (MISCELLANEOUS) IMPLANT
ELECT LOOP MED HF 24F 12D (CUTTING LOOP) IMPLANT
GLOVE BIO SURGEON STRL SZ7 (GLOVE) IMPLANT
GLOVE BIO SURGEON STRL SZ7.5 (GLOVE) IMPLANT
GOWN STRL REUS W/ TWL LRG LVL3 (GOWN DISPOSABLE) IMPLANT
GOWN STRL REUS W/TWL LRG LVL3 (GOWN DISPOSABLE)
GOWN STRL REUS W/TWL XL LVL3 (GOWN DISPOSABLE) IMPLANT
KIT RM TURNOVER CYSTO AR (KITS) IMPLANT
LOOP CUT RT ANGL 28F (MISCELLANEOUS) IMPLANT
PACK CYSTO AR (MISCELLANEOUS) IMPLANT
PAD GROUND ADULT SPLIT (MISCELLANEOUS) IMPLANT
PREP PVP WINGED SPONGE (MISCELLANEOUS) IMPLANT
ROLLER BALL 3MM 24FR (ELECTROSURGICAL) IMPLANT
SCRUB CHG 4% DYNA-HEX 4OZ (MISCELLANEOUS) IMPLANT
SENSORWIRE 0.038 NOT ANGLED (WIRE)
SET IRRIG Y TYPE TUR BLADDER L (SET/KITS/TRAYS/PACK) IMPLANT
SOL .9 NS 3000ML IRR  AL (IV SOLUTION)
SOL .9 NS 3000ML IRR UROMATIC (IV SOLUTION) IMPLANT
SURGILUBE 2OZ TUBE FLIPTOP (MISCELLANEOUS) IMPLANT
SYRINGE IRR TOOMEY STRL 70CC (SYRINGE) IMPLANT
WATER STERILE IRR 1000ML POUR (IV SOLUTION) IMPLANT
WATER STERILE IRR 3000ML UROMA (IV SOLUTION) IMPLANT
WIRE SENSOR 0.038 NOT ANGLED (WIRE) IMPLANT

## 2015-03-30 NOTE — Telephone Encounter (Signed)
-----   Message from [name], MD sent at 03/30/2015 11:17 AM EDT ----- Mr. Seoane needs to be rescheduled next week for TURBT, bilateral retrograde pyelogram.  He still has a chest tube from his lung biopsy, and anesthesia does not want to put him to sleep. It is supposed to be removed Friday.

## 2015-03-30 NOTE — Interval H&P Note (Signed)
History and Physical Interval Note:  03/30/2015 10:48 AM  Joseph Graham  has presented today for surgery, with the diagnosis of BLADDER MASS  The various methods of treatment have been discussed with the patient and family. After consideration of risks, benefits and other options for treatment, the patient has consented to  Procedure(s): TRANSURETHRAL RESECTION OF BLADDER TUMOR (TURBT) (N/A) CYSTOSCOPY WITH RETROGRADE PYELOGRAM (Bilateral) as a surgical intervention .  The patient's history has been reviewed, patient examined, no change in status, stable for surgery.  I have reviewed the patient's chart and labs.  Questions were answered to the patient's satisfaction.     RRR Unlabored respirations. Chest tube in place still  [name]

## 2015-03-30 NOTE — OR Nursing (Signed)
Pt case cancelled per Dr Kayleen Memos, due to pt having chest tube.  Pt and family aware.  Dr [name] in to discuss plan.  Pt to have chest tube removed Friday and have case rescheduled

## 2015-03-30 NOTE — Progress Notes (Signed)
   03/30/15 1100  Clinical Encounter Type  Visited With Patient and family together  Visit Type Initial  Referral From Chaplain;Nurse  Consult/Referral To [place] (For Healthcare)  Does patient have an advance directive? No  Would patient like information on creating an advanced directive? Yes - Scientist, clinical (histocompatibility and immunogenetics) given  Provided pastoral presence and support to patient and his wife on unit.  Pt requested assistance on notarizing A.D. Chaplain explained that two witness and a notary were required and that  It can be done, but may take a bit of time. Pt declined to have AD completed here, but said he would take forms to a bank and have completed there.  Pt and spouse thanked me for my visit.  [place] [phone]

## 2015-03-30 NOTE — H&P (View-Only) (Signed)
03/28/2015 11:41 AM   [name] [birth year] [id number]  Referring provider: [name], MD [address] [place], [place] [address]  Chief Complaint  Patient presents with  . Discuss Surgery    HPI: The patient is a 74 year old gentleman who has both lung and bladder lesions. He had a 2-3 cm exophytic lesion in the midline posterior bladder wall superior to the trigone during office cystoscopy at his last visit. At that time, there was more concern about his lung lesion, so we planned to have his lung lesion addressed first with the TURBT to follow lung surgery . He had a lung biopsy which showed poorly differentiated carcinoma of unclear origin. There is concern it may be metastasis from the bladder, so he was sent back now for resection of his bladder tumor as well as to get a tissue specimen to determine the next step in his treatment.   PMH: Past Medical History  Diagnosis Date  . Colon polyps     next colonoscopy due around 2018  . HTN (hypertension)   . Alcohol abuse, in remission     remote  . Cardiac arrhythmia 03/2010    h/o a flutter and CM, s/p ablation, normal stress test  . Benign essential tremor     improved on metoprolol and gabapentin  . Dyslipidemia     mild off meds  . Chronic low back pain     MRI 2004, multilevel DDD  . BPH (benign prostatic hypertrophy) 12/30/2012  . Personal history of tobacco use, presenting hazards to health 02/18/2015    quit 06/2011, restarted 2014  . Multiple pulmonary nodules 02/2015  . COPD (chronic obstructive pulmonary disease) ([place])   . GERD (gastroesophageal reflux disease)   . Arthritis     BACK AND NECK  . Complication of anesthesia     DID NOT GET COMPLETELY NUMB WITH TONSILLECTOMY  . Shortness of breath dyspnea     OCC WITH EXERTION    Surgical History: Past Surgical History  Procedure Laterality Date  . Hemorroidectomy  1982  . A flutter ablation  03/2010  . Cataract extraction    . Carotid US   03/2010    WNL  . Colonoscopy  09/2011    polyps, rpt due 5 yrs, mild diverticulosis [name])  . Tonsillectomy  1964  . Electromagnetic navigation brochoscopy N/A 03/02/2015    Procedure: ELECTROMAGNETIC NAVIGATION BRONCHOSCOPY;  Surgeon: [name], MD;  Location: [facility] ORS;  Service: Cardiopulmonary;  Laterality: N/A;    Home Medications:    Medication List       This list is accurate as of: 03/28/15 11:41 AM.  Always use your most recent med list.               famotidine 10 MG tablet  Commonly known as:  PEPCID  Take 10 mg by mouth as needed for heartburn or indigestion.     gabapentin 100 MG capsule  Commonly known as:  NEURONTIN  TAKE 1 CAPSULE BY MOUTH TWICE A DAY AS NEEDED     lisinopril 10 MG tablet  Commonly known as:  PRINIVIL,ZESTRIL  Take 1 tablet (10 mg total) by mouth daily.     loperamide 2 MG capsule  Commonly known as:  IMODIUM  Take 1 capsule (2 mg total) by mouth 3 (three) times daily as needed for diarrhea or loose stools.     metoprolol tartrate 25 MG tablet  Commonly known as:  LOPRESSOR  Take 0.5 tablets (  12.5 mg total) by mouth 2 (two) times daily.     rosuvastatin 5 MG tablet  Commonly known as:  CRESTOR  Take 1 tablet (5 mg total) by mouth daily.        Allergies: No Known Allergies  Family History: Family History  Problem Relation Age of Onset  . Cervical cancer Mother 71  . Hypertension Father   . Stroke Brother 73  . Tremor Father     and several uncles/aunts (not parkinson's)  . Bladder Cancer Brother 28  . Prostate cancer Neg Hx   . Kidney cancer Neg Hx     Social History:  reports that he has quit smoking. His smoking use included Cigarettes. He has a 91.5 pack-year smoking history. He has never used smokeless tobacco. He reports that he does not drink alcohol or use illicit drugs.  ROS:                                        Physical Exam: BP 145/72 mmHg  Pulse 73  Ht '5\' 8"'$  (1.727 m)  Wt  211 lb 3.2 oz (95.8 kg)  BMI 32.12 kg/m2  Constitutional:  Alert and oriented, No acute distress. HEENT: [place] AT, moist mucus membranes.  Trachea midline, no masses. Cardiovascular: No clubbing, cyanosis, or edema. Respiratory: Normal respiratory effort, no increased work of breathing. GI: Abdomen is soft, nontender, nondistended, no abdominal masses GU: No CVA tenderness.  Skin: No rashes, bruises or suspicious lesions. Lymph: No cervical or inguinal adenopathy. Neurologic: Grossly intact, no focal deficits, moving all 4 extremities. Psychiatric: Normal mood and affect.  Laboratory Data: Lab Results  Component Value Date   WBC 9.5 03/22/2015   HGB 13.6 03/22/2015   HCT 40.0 03/22/2015   MCV 94.4 03/22/2015   PLT 221 03/22/2015    Lab Results  Component Value Date   CREATININE 0.93 03/22/2015    Lab Results  Component Value Date   PSA 0.60 01/24/2015   PSA 0.68 12/31/2013   PSA 0.67 12/30/2012    No results found for: TESTOSTERONE  Lab Results  Component Value Date   HGBA1C 6.3 01/24/2015    Urinalysis    Component Value Date/Time   GLUCOSEU Negative 03/09/2015 1348   BILIRUBINUR Negative 03/09/2015 1348   NITRITE Negative 03/09/2015 1348   LEUKOCYTESUR Negative 03/09/2015 1348    Pathology: Urine only decent as he also the Flomax, and responded  Surgical Pathology  CASE: [id number]  PATIENT: Joseph Graham  Surgical Pathology Report      SPECIMEN SUBMITTED:  A. RUL Lung Mass, biopsy   CLINICAL HISTORY:  74 y.o. male with a history of long-standing smoking/COPD- noted to have  lung nodules x2 right upper lobe on screening CT scan of the lung  September 2016. Possible bladder lesion also noted.   PRE-OPERATIVE DIAGNOSIS:  Multiple pulmonary nodules in the lungs bilaterally - largest in right  upper lobe   POST-OPERATIVE DIAGNOSIS:  Same as pre-op      DIAGNOSIS:  A. LUNG MASS, RIGHT UPPER LOBE; CT-GUIDED BIOPSY:  - POORLY DIFFERENTIATED  CARCINOMA, CANNOT EXCLUDE METASTASES.   Comment:  A panel of immunohistochemical stains was performed with the following  results:  Cytokeratin 7: Positive  Cytokeratin 20: Positive  TT F-1: Negative  Napsin: Negative  P40: Negative  GATA-3: Equivocal, favor negative  Stain controls worked appropriately. The pattern of immunoreactivity  does not lend  support to a particular site of tumor origin. Since a  bladder lesion was identified, recommend urine cytology and cystoscopy.  Preliminary results communicated to Dr. Genevive Bi via [facility] messaging on  03/22/15. Final results were communicated to [facility] in Dr. Soyla Dryer office on  03/24/15.              Assessment & Plan:   On my last cystoscopy, the bladder lesion looked superficial and well-differentiated. Therefore, I feel it is unlikely that this is metastatic disease to his lung from the bladder. We will, however, still plan for a TURBT this week to resect the tumor in his bladder as well as provide a tissue specimen for comparison to his lung lesion.  1. Bladder Lesion TURBT on 03/30/15  2.Microscopic hematuria We will plan for retrograde pyelogram at the time of his TURBT as he had a CT PET which did not have delayed images.  This will complete the hematuria work up.  3.  Poorly differentiated carcinoma of the lung-unclear if metastatic or primary We will be able to better evaluate the origin of the carcinoma after obtaining a tissue specimen during TURBT.   [name], MD  [facility] [address], [place], [place] [address] [phone]

## 2015-03-31 NOTE — Telephone Encounter (Signed)
Notified pt of surgery r/s to 04/06/15. Pt to call day prior to surgery for arrival time to SDS. Pt voices understanding.

## 2015-04-01 ENCOUNTER — Ambulatory Visit: Payer: Self-pay | Admitting: Surgery

## 2015-04-01 ENCOUNTER — Other Ambulatory Visit: Payer: Self-pay | Admitting: Surgery

## 2015-04-01 ENCOUNTER — Ambulatory Visit
Admission: RE | Admit: 2015-04-01 | Discharge: 2015-04-01 | Disposition: A | Discharge status: Home / Self Care | Payer: Medicare Other | Source: Ambulatory Visit | Attending: Surgery | Admitting: Surgery

## 2015-04-01 ENCOUNTER — Ambulatory Visit (INDEPENDENT_AMBULATORY_CARE_PROVIDER_SITE_OTHER): Payer: Self-pay | Admitting: Surgery

## 2015-04-01 ENCOUNTER — Encounter: Payer: Self-pay | Admitting: Surgery

## 2015-04-01 ENCOUNTER — Telehealth: Payer: Self-pay | Admitting: *Deleted

## 2015-04-01 VITALS — BP 158/65 | HR 80 | Temp 98.3°F | Wt 213.0 lb

## 2015-04-01 DIAGNOSIS — J9811 Atelectasis: Secondary | ICD-10-CM | POA: Insufficient documentation

## 2015-04-01 DIAGNOSIS — I517 Cardiomegaly: Secondary | ICD-10-CM | POA: Diagnosis not present

## 2015-04-01 DIAGNOSIS — Z4682 Encounter for fitting and adjustment of non-vascular catheter: Secondary | ICD-10-CM | POA: Diagnosis not present

## 2015-04-01 DIAGNOSIS — Z9889 Other specified postprocedural states: Secondary | ICD-10-CM | POA: Insufficient documentation

## 2015-04-01 DIAGNOSIS — J9383 Other pneumothorax: Secondary | ICD-10-CM

## 2015-04-01 DIAGNOSIS — J95811 Postprocedural pneumothorax: Secondary | ICD-10-CM

## 2015-04-01 DIAGNOSIS — J939 Pneumothorax, unspecified: Secondary | ICD-10-CM | POA: Diagnosis not present

## 2015-04-01 DIAGNOSIS — C3490 Malignant neoplasm of unspecified part of unspecified bronchus or lung: Secondary | ICD-10-CM

## 2015-04-01 HISTORY — DX: Postprocedural pneumothorax: J95.811

## 2015-04-01 NOTE — Patient Instructions (Signed)
Please see your Oncologist.

## 2015-04-01 NOTE — Telephone Encounter (Signed)
Message sent to patient that radiation appt. Is cancelled and we will follow after surgery planned for next week.

## 2015-04-01 NOTE — Progress Notes (Signed)
Surgery clinic  The patient today seen in the office following chest tube insertion for iatrogenic pneumothorax. He was discharged last Saturday with a Heimlich valve in place. He is doing well. He states that on Monday was last time that he noticed the flutter valve occurring.  Chest x-ray obtained today demonstrates a very tiny apical pneumothorax without evidence of subcutaneous emphysema.  On exam the patient's lungs are clear. There is no crepitance surrounding the tube insertion site on the right side. Placement of the Heimlich valve under saline demonstrates no evidence of air leak with deep breathing and coughing. Chest tube was removed. Dressing was applied.  He will see [place] in the office as needed he has follow-up with urology and oncology regarding his metastatic bladder carcinoma.

## 2015-04-04 ENCOUNTER — Ambulatory Visit
Admission: RE | Admit: 2015-04-04 | Discharge: 2015-04-04 | Disposition: A | Discharge status: Home / Self Care | Payer: Medicare Other | Source: Ambulatory Visit | Attending: Radiation Oncology | Admitting: Radiation Oncology

## 2015-04-04 ENCOUNTER — Ambulatory Visit: Payer: Medicare Other

## 2015-04-06 ENCOUNTER — Ambulatory Visit: Payer: Medicare Other | Admitting: Anesthesiology

## 2015-04-06 ENCOUNTER — Ambulatory Visit
Admission: RE | Admit: 2015-04-06 | Discharge: 2015-04-06 | Disposition: A | Discharge status: Home / Self Care | Payer: Medicare Other | Source: Ambulatory Visit | Attending: Urology | Admitting: Urology

## 2015-04-06 ENCOUNTER — Encounter: Payer: Self-pay | Admitting: *Deleted

## 2015-04-06 ENCOUNTER — Telehealth: Payer: Self-pay

## 2015-04-06 ENCOUNTER — Encounter
Admission: RE | Disposition: A | Discharge status: Home / Self Care | Payer: Self-pay | Source: Ambulatory Visit | Attending: Urology

## 2015-04-06 DIAGNOSIS — M1990 Unspecified osteoarthritis, unspecified site: Secondary | ICD-10-CM | POA: Diagnosis not present

## 2015-04-06 DIAGNOSIS — Z86010 Personal history of colonic polyps: Secondary | ICD-10-CM | POA: Diagnosis not present

## 2015-04-06 DIAGNOSIS — K219 Gastro-esophageal reflux disease without esophagitis: Secondary | ICD-10-CM | POA: Insufficient documentation

## 2015-04-06 DIAGNOSIS — I739 Peripheral vascular disease, unspecified: Secondary | ICD-10-CM | POA: Insufficient documentation

## 2015-04-06 DIAGNOSIS — G250 Essential tremor: Secondary | ICD-10-CM | POA: Diagnosis not present

## 2015-04-06 DIAGNOSIS — R918 Other nonspecific abnormal finding of lung field: Secondary | ICD-10-CM | POA: Diagnosis not present

## 2015-04-06 DIAGNOSIS — M4692 Unspecified inflammatory spondylopathy, cervical region: Secondary | ICD-10-CM | POA: Insufficient documentation

## 2015-04-06 DIAGNOSIS — N400 Enlarged prostate without lower urinary tract symptoms: Secondary | ICD-10-CM | POA: Insufficient documentation

## 2015-04-06 DIAGNOSIS — I2510 Atherosclerotic heart disease of native coronary artery without angina pectoris: Secondary | ICD-10-CM | POA: Diagnosis not present

## 2015-04-06 DIAGNOSIS — C689 Malignant neoplasm of urinary organ, unspecified: Secondary | ICD-10-CM | POA: Diagnosis present

## 2015-04-06 DIAGNOSIS — E785 Hyperlipidemia, unspecified: Secondary | ICD-10-CM | POA: Diagnosis not present

## 2015-04-06 DIAGNOSIS — Z8049 Family history of malignant neoplasm of other genital organs: Secondary | ICD-10-CM | POA: Diagnosis not present

## 2015-04-06 DIAGNOSIS — Z823 Family history of stroke: Secondary | ICD-10-CM | POA: Insufficient documentation

## 2015-04-06 DIAGNOSIS — R3129 Other microscopic hematuria: Secondary | ICD-10-CM | POA: Diagnosis not present

## 2015-04-06 DIAGNOSIS — J449 Chronic obstructive pulmonary disease, unspecified: Secondary | ICD-10-CM | POA: Insufficient documentation

## 2015-04-06 DIAGNOSIS — I4892 Unspecified atrial flutter: Secondary | ICD-10-CM | POA: Diagnosis not present

## 2015-04-06 DIAGNOSIS — C676 Malignant neoplasm of ureteric orifice: Secondary | ICD-10-CM | POA: Diagnosis not present

## 2015-04-06 DIAGNOSIS — Z9849 Cataract extraction status, unspecified eye: Secondary | ICD-10-CM | POA: Diagnosis not present

## 2015-04-06 DIAGNOSIS — Z87891 Personal history of nicotine dependence: Secondary | ICD-10-CM | POA: Diagnosis not present

## 2015-04-06 DIAGNOSIS — C3490 Malignant neoplasm of unspecified part of unspecified bronchus or lung: Secondary | ICD-10-CM | POA: Diagnosis not present

## 2015-04-06 DIAGNOSIS — C670 Malignant neoplasm of trigone of bladder: Secondary | ICD-10-CM | POA: Diagnosis not present

## 2015-04-06 DIAGNOSIS — G8929 Other chronic pain: Secondary | ICD-10-CM | POA: Insufficient documentation

## 2015-04-06 DIAGNOSIS — M545 Low back pain: Secondary | ICD-10-CM | POA: Diagnosis not present

## 2015-04-06 DIAGNOSIS — Z8249 Family history of ischemic heart disease and other diseases of the circulatory system: Secondary | ICD-10-CM | POA: Diagnosis not present

## 2015-04-06 DIAGNOSIS — Z8052 Family history of malignant neoplasm of bladder: Secondary | ICD-10-CM | POA: Diagnosis not present

## 2015-04-06 DIAGNOSIS — Z79899 Other long term (current) drug therapy: Secondary | ICD-10-CM | POA: Diagnosis not present

## 2015-04-06 DIAGNOSIS — C679 Malignant neoplasm of bladder, unspecified: Secondary | ICD-10-CM | POA: Diagnosis not present

## 2015-04-06 DIAGNOSIS — N329 Bladder disorder, unspecified: Secondary | ICD-10-CM | POA: Diagnosis not present

## 2015-04-06 DIAGNOSIS — Z820 Family history of epilepsy and other diseases of the nervous system: Secondary | ICD-10-CM | POA: Diagnosis not present

## 2015-04-06 DIAGNOSIS — I10 Essential (primary) hypertension: Secondary | ICD-10-CM | POA: Diagnosis not present

## 2015-04-06 DIAGNOSIS — F1010 Alcohol abuse, uncomplicated: Secondary | ICD-10-CM | POA: Diagnosis not present

## 2015-04-06 HISTORY — PX: TRANSURETHRAL RESECTION OF BLADDER TUMOR: SHX2575

## 2015-04-06 HISTORY — PX: CYSTOSCOPY W/ RETROGRADES: SHX1426

## 2015-04-06 SURGERY — TURBT (TRANSURETHRAL RESECTION OF BLADDER TUMOR)
Anesthesia: General | Wound class: Clean Contaminated

## 2015-04-06 MED ORDER — MEPERIDINE HCL 25 MG/ML IJ SOLN: 6.2500 mg | INTRAMUSCULAR | Status: DC | PRN

## 2015-04-06 MED ORDER — CIPROFLOXACIN HCL 500 MG PO TABS
500.0000 mg | ORAL_TABLET | Freq: Once | ORAL | Status: AC
  Administered 2015-04-06: 500 mg via ORAL

## 2015-04-06 MED ORDER — PHENYLEPHRINE HCL 10 MG/ML IJ SOLN
INTRAMUSCULAR | Status: DC | PRN
  Administered 2015-04-06 (×2): 50 ug via INTRAVENOUS
  Administered 2015-04-06: 100 ug via INTRAVENOUS

## 2015-04-06 MED ORDER — FENTANYL CITRATE (PF) 100 MCG/2ML IJ SOLN: 25.0000 ug | INTRAMUSCULAR | Status: DC | PRN

## 2015-04-06 MED ORDER — ROCURONIUM BROMIDE 100 MG/10ML IV SOLN
INTRAVENOUS | Status: DC | PRN
  Administered 2015-04-06: 5 mg via INTRAVENOUS
  Administered 2015-04-06: 10 mg via INTRAVENOUS

## 2015-04-06 MED ORDER — LACTATED RINGERS IV SOLN
INTRAVENOUS | Status: DC
  Administered 2015-04-06: 14:00:00 via INTRAVENOUS

## 2015-04-06 MED ORDER — ONDANSETRON HCL 4 MG/2ML IJ SOLN
INTRAMUSCULAR | Status: DC | PRN
  Administered 2015-04-06: 4 mg via INTRAVENOUS

## 2015-04-06 MED ORDER — CIPROFLOXACIN HCL 500 MG PO TABS
ORAL_TABLET | ORAL | Status: AC
  Administered 2015-04-06: 500 mg via ORAL
  Filled 2015-04-06: qty 1

## 2015-04-06 MED ORDER — CIPROFLOXACIN HCL 500 MG PO TABS: 500.0000 mg | ORAL_TABLET | Freq: Two times a day (BID) | ORAL | Status: DC

## 2015-04-06 MED ORDER — LIDOCAINE HCL (CARDIAC) 20 MG/ML IV SOLN
INTRAVENOUS | Status: DC | PRN
  Administered 2015-04-06: 60 mg via INTRAVENOUS

## 2015-04-06 MED ORDER — PROPOFOL 10 MG/ML IV BOLUS
INTRAVENOUS | Status: DC | PRN
  Administered 2015-04-06: 200 mg via INTRAVENOUS

## 2015-04-06 MED ORDER — HYDROCODONE-ACETAMINOPHEN 5-325 MG PO TABS: 1.0000 | ORAL_TABLET | Freq: Four times a day (QID) | ORAL | Status: DC | PRN

## 2015-04-06 MED ORDER — SUCCINYLCHOLINE CHLORIDE 20 MG/ML IJ SOLN
INTRAMUSCULAR | Status: DC | PRN
  Administered 2015-04-06: 100 mg via INTRAVENOUS

## 2015-04-06 MED ORDER — FENTANYL CITRATE (PF) 100 MCG/2ML IJ SOLN
INTRAMUSCULAR | Status: DC | PRN
Start: 2015-04-06 — End: 2015-04-06
  Administered 2015-04-06: 100 ug via INTRAVENOUS

## 2015-04-06 MED ORDER — IOTHALAMATE MEGLUMINE 43 % IV SOLN
INTRAVENOUS | Status: DC | PRN
  Administered 2015-04-06: 25 mL

## 2015-04-06 MED ORDER — NEOSTIGMINE METHYLSULFATE 10 MG/10ML IV SOLN
INTRAVENOUS | Status: DC | PRN
  Administered 2015-04-06: 4 mg via INTRAVENOUS

## 2015-04-06 MED ORDER — GLYCOPYRROLATE 0.2 MG/ML IJ SOLN
INTRAMUSCULAR | Status: DC | PRN
  Administered 2015-04-06: .8 mg via INTRAVENOUS

## 2015-04-06 SURGICAL SUPPLY — 32 items
BACTOSHIELD CHG 4% 4OZ (MISCELLANEOUS) ×2
BAG DRAIN CYSTO-URO LG1000N (MISCELLANEOUS) ×4 IMPLANT
BAG URO DRAIN 2000ML W/SPOUT (MISCELLANEOUS) ×4 IMPLANT
CATH FOL 2WAY LX 20X30 (CATHETERS) ×4 IMPLANT
CATH FOLEY 2WAY  5CC 16FR (CATHETERS)
CATH URETL 5X70 OPEN END (CATHETERS) ×4 IMPLANT
CATH URTH 16FR FL 2W BLN LF (CATHETERS) IMPLANT
CONRAY 43 FOR UROLOGY 50M (MISCELLANEOUS) ×4 IMPLANT
CORD URO TURP 10FT (MISCELLANEOUS) ×4 IMPLANT
ELECT LOOP MED HF 24F 12D (CUTTING LOOP) ×4 IMPLANT
GLOVE BIO SURGEON STRL SZ7 (GLOVE) ×8 IMPLANT
GLOVE BIO SURGEON STRL SZ7.5 (GLOVE) ×4 IMPLANT
GOWN STRL REUS W/ TWL LRG LVL3 (GOWN DISPOSABLE) ×4 IMPLANT
GOWN STRL REUS W/TWL LRG LVL3 (GOWN DISPOSABLE) ×4
GOWN STRL REUS W/TWL XL LVL3 (GOWN DISPOSABLE) ×4 IMPLANT
KIT RM TURNOVER CYSTO AR (KITS) ×4 IMPLANT
LOOP CUT RT ANGL 28F (MISCELLANEOUS) IMPLANT
PACK CYSTO AR (MISCELLANEOUS) ×4 IMPLANT
PAD GROUND ADULT SPLIT (MISCELLANEOUS) IMPLANT
PREP PVP WINGED SPONGE (MISCELLANEOUS) IMPLANT
ROLLER BALL 3MM 24FR (ELECTROSURGICAL) IMPLANT
SCRUB CHG 4% DYNA-HEX 4OZ (MISCELLANEOUS) ×2 IMPLANT
SENSORWIRE 0.038 NOT ANGLED (WIRE) ×4
SET IRRIG Y TYPE TUR BLADDER L (SET/KITS/TRAYS/PACK) ×4 IMPLANT
SOL .9 NS 3000ML IRR  AL (IV SOLUTION) ×4
SOL .9 NS 3000ML IRR UROMATIC (IV SOLUTION) ×4 IMPLANT
STENT URET 6FRX26 CONTOUR (STENTS) ×4 IMPLANT
SURGILUBE 2OZ TUBE FLIPTOP (MISCELLANEOUS) ×4 IMPLANT
SYRINGE IRR TOOMEY STRL 70CC (SYRINGE) ×4 IMPLANT
WATER STERILE IRR 1000ML POUR (IV SOLUTION) ×4 IMPLANT
WATER STERILE IRR 3000ML UROMA (IV SOLUTION) ×4 IMPLANT
WIRE SENSOR 0.038 NOT ANGLED (WIRE) ×2 IMPLANT

## 2015-04-06 NOTE — Anesthesia Postprocedure Evaluation (Signed)
  Anesthesia Post-op Note  Patient: Joseph Graham  Procedure(s) Performed: Procedure(s): TRANSURETHRAL RESECTION OF BLADDER TUMOR (TURBT) right ureteral stent placement (N/A) CYSTOSCOPY WITH RETROGRADE PYELOGRAM (Bilateral)  Anesthesia type:General  Patient location: PACU  Post pain: Pain level controlled  Post assessment: Post-op Vital signs reviewed, Patient's Cardiovascular Status Stable, Respiratory Function Stable, Patent Airway and No signs of Nausea or vomiting  Post vital signs: Reviewed and stable  Last Vitals:  Filed Vitals:   04/06/15 1515  BP: 168/79  Pulse: 84  Temp: 36.1 C  Resp: 17    Level of consciousness: awake, alert  and patient cooperative  Complications: No apparent anesthesia complications

## 2015-04-06 NOTE — Anesthesia Procedure Notes (Signed)
Procedure Name: Intubation Date/Time: 04/06/2015 2:24 PM Performed by: Dionne Bucy Pre-anesthesia Checklist: Patient identified, Patient being monitored, Timeout performed, Emergency Drugs available and Suction available Patient Re-evaluated:Patient Re-evaluated prior to inductionOxygen Delivery Method: Circle system utilized Preoxygenation: Pre-oxygenation with 100% oxygen Intubation Type: IV induction Ventilation: Mask ventilation without difficulty Laryngoscope Size: Mac and 4 Grade View: Grade I Tube type: Oral Tube size: 7.5 mm Number of attempts: 1 Airway Equipment and Method: Stylet Placement Confirmation: ETT inserted through vocal cords under direct vision,  positive ETCO2 and breath sounds checked- equal and bilateral Secured at: 23 cm Tube secured with: Tape Dental Injury: Teeth and Oropharynx as per pre-operative assessment

## 2015-04-06 NOTE — Interval H&P Note (Signed)
History and Physical Interval Note:  04/06/2015 1:18 PM  Joseph Graham  has presented today for surgery, with the diagnosis of BLADDER MASS  The various methods of treatment have been discussed with the patient and family. After consideration of risks, benefits and other options for treatment, the patient has consented to  Procedure(s): TRANSURETHRAL RESECTION OF BLADDER TUMOR (TURBT) (N/A) CYSTOSCOPY WITH RETROGRADE PYELOGRAM (Bilateral) as a surgical intervention .  The patient's history has been reviewed, patient examined, no change in status, stable for surgery.  I have reviewed the patient's chart and labs.  Questions were answered to the patient's satisfaction.    RRR Unlabored respirations  Horald Pollen [place]

## 2015-04-06 NOTE — Anesthesia Preprocedure Evaluation (Addendum)
Anesthesia Evaluation  Patient identified by MRN, date of birth, ID band Patient awake    Airway Mallampati: III  TM Distance: >3 FB     Dental  (+) Teeth Intact   Pulmonary shortness of breath and with exertion, COPD, former smoker,    breath sounds clear to auscultation       Cardiovascular hypertension, + CAD and + Peripheral Vascular Disease  + dysrhythmias Atrial Fibrillation  Rhythm:Regular Rate:Normal     Neuro/Psych    GI/Hepatic GERD  Controlled,  Endo/Other    Renal/GU      Musculoskeletal  (+) Arthritis , Osteoarthritis,    Abdominal (+)  Abdomen: soft.    Peds  Hematology   Anesthesia Other Findings   Reproductive/Obstetrics                            Anesthesia Physical Anesthesia Plan  ASA: III  Anesthesia Plan: General   Post-op Pain Management:    Induction: Intravenous  Airway Management Planned: Oral ETT  Additional Equipment:   Intra-op Plan:   Post-operative Plan: Extubation in OR  Informed Consent: I have reviewed the patients History and Physical, chart, labs and discussed the procedure including the risks, benefits and alternatives for the proposed anesthesia with the patient or authorized representative who has indicated his/her understanding and acceptance.     Plan Discussed with: CRNA  Anesthesia Plan Comments:         Anesthesia Quick Evaluation

## 2015-04-06 NOTE — Transfer of Care (Signed)
Immediate Anesthesia Transfer of Care Note  Patient: Joseph Graham  Procedure(s) Performed: Procedure(s): TRANSURETHRAL RESECTION OF BLADDER TUMOR (TURBT) right ureteral stent placement (N/A) CYSTOSCOPY WITH RETROGRADE PYELOGRAM (Bilateral)  Patient Location: PACU  Anesthesia Type:General  Level of Consciousness: awake and patient cooperative  Airway & Oxygen Therapy: Patient Spontanous Breathing and Patient connected to face mask oxygen  Post-op Assessment: Report given to RN  Post vital signs: Reviewed and stable  Last Vitals:  Filed Vitals:   04/06/15 1515  BP: 168/79  Pulse: 84  Temp: 36.1 C  Resp: 17    Complications: No apparent anesthesia complications

## 2015-04-06 NOTE — Telephone Encounter (Signed)
-----   Message from [name], MD sent at 04/06/2015  3:13 PM EDT ----- Patient needs to f/u with me in one week to go over pathology and have his right ureteral stent removed. Ok to over book if needed.  Thanks

## 2015-04-06 NOTE — H&P (View-Only) (Signed)
03/28/2015 11:41 AM   Joseph Graham [birth year] [id number]  Referring provider: [name], MD [address] [place], [place] [address]  Chief Complaint  Patient presents with  . Discuss Surgery    HPI: The patient is a 74 year old gentleman who has both lung and bladder lesions. He had a 2-3 cm exophytic lesion in the midline posterior bladder wall superior to the trigone during office cystoscopy at his last visit. At that time, there was more concern about his lung lesion, so we planned to have his lung lesion addressed first with the TURBT to follow lung surgery . He had a lung biopsy which showed poorly differentiated carcinoma of unclear origin. There is concern it may be metastasis from the bladder, so he was sent back now for resection of his bladder tumor as well as to get a tissue specimen to determine the next step in his treatment.   PMH: Past Medical History  Diagnosis Date  . Colon polyps     next colonoscopy due around 2018  . HTN (hypertension)   . Alcohol abuse, in remission     remote  . Cardiac arrhythmia 03/2010    h/o a flutter and CM, s/p ablation, normal stress test  . Benign essential tremor     improved on metoprolol and gabapentin  . Dyslipidemia     mild off meds  . Chronic low back pain     MRI 2004, multilevel DDD  . BPH (benign prostatic hypertrophy) 12/30/2012  . Personal history of tobacco use, presenting hazards to health 02/18/2015    quit 06/2011, restarted 2014  . Multiple pulmonary nodules 02/2015  . COPD (chronic obstructive pulmonary disease) ([place])   . GERD (gastroesophageal reflux disease)   . Arthritis     BACK AND NECK  . Complication of anesthesia     DID NOT GET COMPLETELY NUMB WITH TONSILLECTOMY  . Shortness of breath dyspnea     OCC WITH EXERTION    Surgical History: Past Surgical History  Procedure Laterality Date  . Hemorroidectomy  1982  . A flutter ablation  03/2010  . Cataract extraction    . Carotid US   03/2010    WNL  . Colonoscopy  09/2011    polyps, rpt due 5 yrs, mild diverticulosis [name])  . Tonsillectomy  1964  . Electromagnetic navigation brochoscopy N/A 03/02/2015    Procedure: ELECTROMAGNETIC NAVIGATION BRONCHOSCOPY;  Surgeon: [name], MD;  Location: [facility] ORS;  Service: Cardiopulmonary;  Laterality: N/A;    Home Medications:    Medication List       This list is accurate as of: 03/28/15 11:41 AM.  Always use your most recent med list.               famotidine 10 MG tablet  Commonly known as:  PEPCID  Take 10 mg by mouth as needed for heartburn or indigestion.     gabapentin 100 MG capsule  Commonly known as:  NEURONTIN  TAKE 1 CAPSULE BY MOUTH TWICE A DAY AS NEEDED     lisinopril 10 MG tablet  Commonly known as:  PRINIVIL,ZESTRIL  Take 1 tablet (10 mg total) by mouth daily.     loperamide 2 MG capsule  Commonly known as:  IMODIUM  Take 1 capsule (2 mg total) by mouth 3 (three) times daily as needed for diarrhea or loose stools.     metoprolol tartrate 25 MG tablet  Commonly known as:  LOPRESSOR  Take 0.5 tablets (  12.5 mg total) by mouth 2 (two) times daily.     rosuvastatin 5 MG tablet  Commonly known as:  CRESTOR  Take 1 tablet (5 mg total) by mouth daily.        Allergies: No Known Allergies  Family History: Family History  Problem Relation Age of Onset  . Cervical cancer Mother 37  . Hypertension Father   . Stroke Brother 40  . Tremor Father     and several uncles/aunts (not parkinson's)  . Bladder Cancer Brother 67  . Prostate cancer Neg Hx   . Kidney cancer Neg Hx     Social History:  reports that he has quit smoking. His smoking use included Cigarettes. He has a 91.5 pack-year smoking history. He has never used smokeless tobacco. He reports that he does not drink alcohol or use illicit drugs.  ROS:                                        Physical Exam: BP 145/72 mmHg  Pulse 73  Ht '5\' 8"'$  (1.727 m)  Wt  211 lb 3.2 oz (95.8 kg)  BMI 32.12 kg/m2  Constitutional:  Alert and oriented, No acute distress. HEENT: [place] AT, moist mucus membranes.  Trachea midline, no masses. Cardiovascular: No clubbing, cyanosis, or edema. Respiratory: Normal respiratory effort, no increased work of breathing. GI: Abdomen is soft, nontender, nondistended, no abdominal masses GU: No CVA tenderness.  Skin: No rashes, bruises or suspicious lesions. Lymph: No cervical or inguinal adenopathy. Neurologic: Grossly intact, no focal deficits, moving all 4 extremities. Psychiatric: Normal mood and affect.  Laboratory Data: Lab Results  Component Value Date   WBC 9.5 03/22/2015   HGB 13.6 03/22/2015   HCT 40.0 03/22/2015   MCV 94.4 03/22/2015   PLT 221 03/22/2015    Lab Results  Component Value Date   CREATININE 0.93 03/22/2015    Lab Results  Component Value Date   PSA 0.60 01/24/2015   PSA 0.68 12/31/2013   PSA 0.67 12/30/2012    No results found for: TESTOSTERONE  Lab Results  Component Value Date   HGBA1C 6.3 01/24/2015    Urinalysis    Component Value Date/Time   GLUCOSEU Negative 03/09/2015 1348   BILIRUBINUR Negative 03/09/2015 1348   NITRITE Negative 03/09/2015 1348   LEUKOCYTESUR Negative 03/09/2015 1348    Pathology: Urine only decent as he also the Flomax, and responded  Surgical Pathology  CASE: [phone]  PATIENT: Joseph Graham  Surgical Pathology Report      SPECIMEN SUBMITTED:  A. RUL Lung Mass, biopsy   CLINICAL HISTORY:  74 y.o. male with a history of long-standing smoking/COPD- noted to have  lung nodules x2 right upper lobe on screening CT scan of the lung  September 2016. Possible bladder lesion also noted.   PRE-OPERATIVE DIAGNOSIS:  Multiple pulmonary nodules in the lungs bilaterally - largest in right  upper lobe   POST-OPERATIVE DIAGNOSIS:  Same as pre-op      DIAGNOSIS:  A. LUNG MASS, RIGHT UPPER LOBE; CT-GUIDED BIOPSY:  - POORLY DIFFERENTIATED  CARCINOMA, CANNOT EXCLUDE METASTASES.   Comment:  A panel of immunohistochemical stains was performed with the following  results:  Cytokeratin 7: Positive  Cytokeratin 20: Positive  TT F-1: Negative  Napsin: Negative  P40: Negative  GATA-3: Equivocal, favor negative  Stain controls worked appropriately. The pattern of immunoreactivity  does not lend  support to a particular site of tumor origin. Since a  bladder lesion was identified, recommend urine cytology and cystoscopy.  Preliminary results communicated to Dr. Genevive Bi via [facility] messaging on  03/22/15. Final results were communicated to [facility] in Dr. Soyla Dryer office on  03/24/15.              Assessment & Plan:   On my last cystoscopy, the bladder lesion looked superficial and well-differentiated. Therefore, I feel it is unlikely that this is metastatic disease to his lung from the bladder. We will, however, still plan for a TURBT this week to resect the tumor in his bladder as well as provide a tissue specimen for comparison to his lung lesion.  1. Bladder Lesion TURBT on 03/30/15  2.Microscopic hematuria We will plan for retrograde pyelogram at the time of his TURBT as he had a CT PET which did not have delayed images.  This will complete the hematuria work up.  3.  Poorly differentiated carcinoma of the lung-unclear if metastatic or primary We will be able to better evaluate the origin of the carcinoma after obtaining a tissue specimen during TURBT.   [name], MD  [facility] [address] [place], [place] [address] [phone]

## 2015-04-06 NOTE — Op Note (Signed)
Preoperative diagnosis: Bladder tumor, lung cancer next  Postoperative diagnosis: Same  Procedure: Cystoscopy, transurethral resection of bladder tumor (4 cm), bilateral retrograde pyelogramwith interpretation, right ureteral stent placement Rensi 6 French by 26 cm)  Surgeon: Baruch Gouty, M.D.  Specimen: Bladder tumor to pathology  Retrograde pyelogram findings: The patient had normal bilateral retrograde polygrams. There was no masses or filling defects noted.  Drains: 6 French by 26 double-J right ureteral stent  Anesthesia: Gen.  Disposition: Stable to postanesthesia care unit  Indications for procedure: The patient is a 74 year old gentleman who has a diagnosis of an undifferentiated lung cancer unclear if it metastatic or primary who also has a lesion within his bladder. He presents today for resection of his bladder tumor. Next  Description of procedure: The patient was met in the preoperative area where all risks, benefits, and indications of procedure were described in great detail. The patient consented to the procedure. Preoperative antibiotic given. Patient was transferred to the operative theater. Gen. anesthesia was induced per the anesthesia service. The patient was then placed in the dorsal lithotomy position and prepped and draped in sterile fashion. Timeout was called. A 24 French's cystooscope was inserted per urethra atraumatically. The bladder tumor was visualized essentially overlying the right ureteral orifice. Bilateral retrograde pyelograms were obtained. These were unremarkable. A open-ended catheter was then placed up to the proximal right ureter. A resectoscope was then exchanged. The 4 cm tumor was then excised in its entirety. The right ureter  was protected during the resection with the open-ended ureteral catheter. The tumor was then evacuated from the bladder. Hemostasis was then obtained which was excellent. The right open ureteral catheter exchanged for a  right ureteral sensor wire. A 6 French by 26 cm double-J ureteral stent was then placed over the sensor wire and sensor wire removed. The curl was seen proximally in the patient's right renal pelvis with fluoroscopy. A curl seen in the bladder with direct visualization. At this point there was some oozing at the patient's bladder neck, so the decision was made to leave a ureteral catheter. This will be removed in the postop anesthesia care unit. The patient awoke from anesthesia and transferred in stable condition to postanesthesia care unit.  Plan: The patient's catheter will be removed in about one hour. Patient follow-up for pathology results and to remove his right ureteral stent next week.

## 2015-04-06 NOTE — Discharge Instructions (Signed)

## 2015-04-07 ENCOUNTER — Encounter: Payer: Self-pay | Admitting: Urology

## 2015-04-07 ENCOUNTER — Ambulatory Visit (INDEPENDENT_AMBULATORY_CARE_PROVIDER_SITE_OTHER): Payer: Medicare Other | Admitting: Urology

## 2015-04-07 DIAGNOSIS — R339 Retention of urine, unspecified: Secondary | ICD-10-CM

## 2015-04-07 DIAGNOSIS — D494 Neoplasm of unspecified behavior of bladder: Secondary | ICD-10-CM

## 2015-04-07 LAB — BLADDER SCAN AMB NON-IMAGING

## 2015-04-07 NOTE — Addendum Note (Signed)
Addended by: Tommy Rainwater on: 04/07/2015 10:52 AM   Modules accepted: Orders

## 2015-04-07 NOTE — Progress Notes (Signed)
Simple Catheter Placement  Due to urinary retention post op patient states that he has been unable to void after discharge from the hospital and it a great deal of discomfort. Bladder scan showed 850m and per Dr. BPilar Jarvispatient was prepped for a foley cath placement.  Patient was cleaned and prepped in a sterile fashion with betadine and lidocaine jelly 2% was instilled into the urethra.  Per Dr. BPilar Jarvisa A 20 FR foley catheter was inserted, urine return was noted  7051m urine was tea colored with red.  The balloon was filled with 10cc of sterile water.  A leg bag was attached for drainage. Patient was also given a night bag to take home and was given instruction on how to change from one bag to another.  Patient was given instruction on proper catheter care.  Patient tolerated well, complications were noted as: skin over the urethral meatus with obstruction, there was slight resistance but patient tolerated well . There were no clots noted but patient and his wife were given instruction that if the urine did not drain into the bag to noify usKoreancase there is a need for irriagation. Patient and wife were given information on cath care and explination on bladder spasms and blood in the urine due to the cath being in place and status post op.   Preformed by: SaFonnie JarvisCMA  Additional notes/ Follow up: Per Dr. BuPilar Jarvisatient was given 2 sample bottles of Rapaflo, patient was given instruction to take one tablet daily. Monday for cath removal, and Thursday for stent removal

## 2015-04-07 NOTE — Progress Notes (Addendum)
The patient returned to the clinic after being unable to urinate overnight. Bladder scan showed 800 cc in his bladder. On attempts to place catheter, it was noted that he had skin bridge over the urethral meatus obstructing it. Foley catheters placed for 800 cc of tea-colored urine. There is no signifcant clots. The patient was given samples of Rapaflo and instructed to take a night to avoid orthostatic hypotension. He will follow-up on Monday for a trial of void. He'll follow-up later in the week for his right ureteral stent removal.

## 2015-04-08 ENCOUNTER — Ambulatory Visit (INDEPENDENT_AMBULATORY_CARE_PROVIDER_SITE_OTHER): Payer: Medicare Other | Admitting: Obstetrics and Gynecology

## 2015-04-08 ENCOUNTER — Encounter: Payer: Self-pay | Admitting: Obstetrics and Gynecology

## 2015-04-08 ENCOUNTER — Encounter: Payer: Self-pay | Admitting: Family Medicine

## 2015-04-08 ENCOUNTER — Ambulatory Visit
Admission: RE | Admit: 2015-04-08 | Discharge: 2015-04-08 | Disposition: A | Discharge status: Home / Self Care | Payer: Medicare Other | Source: Ambulatory Visit | Attending: Obstetrics and Gynecology | Admitting: Obstetrics and Gynecology

## 2015-04-08 VITALS — BP 147/69 | HR 84 | Temp 98.4°F | Ht 68.0 in | Wt 210.9 lb

## 2015-04-08 DIAGNOSIS — G8918 Other acute postprocedural pain: Secondary | ICD-10-CM

## 2015-04-08 DIAGNOSIS — R109 Unspecified abdominal pain: Secondary | ICD-10-CM | POA: Diagnosis not present

## 2015-04-08 DIAGNOSIS — Z466 Encounter for fitting and adjustment of urinary device: Secondary | ICD-10-CM | POA: Diagnosis not present

## 2015-04-08 LAB — SURGICAL PATHOLOGY

## 2015-04-08 MED ORDER — OXYBUTYNIN CHLORIDE 5 MG PO TABS
ORAL_TABLET | ORAL | Status: DC
Start: 2015-04-08 — End: 2015-04-15

## 2015-04-08 NOTE — Patient Instructions (Signed)
   Ureteral Stent  Patient Education A stent is a hollow tube that maintains patency until healing can take place or an obstruction is relieved. It allows urine to flow from the kidney to the bladder.  Indications for stenting include: Relief of ureteral obstruction (stones, cancer, stricture) and provide drainage; Promote healing of the ureter by providing internal support after a ureteral procedure  Prevent potential complications by helping place a guidewire into the ureter; Assist in dilating the ureter before the next ureteroscopy; Bypass obstructions, either from internal or external causes;  Procedure: Under general anesthesia in a cystoscopy suite, the ureteroscope is introduced into the bladder through the urethra and then up into the desired ureter.   Stent Removal: Remove in 2-7 days after ureteroscopy in uncomplicated cases; Remove in 1-2 weeks in cases of ureteral perforation or persisting concern of obstruction; A string may be left on your stent and you will be instructed by your provider when to gently pull the string to remove your stent at home.  Can be removed in the office with topical anesthesia with a flexible ureteroscope and grasper; Can be removed in the cysto suite under anesthesia for patients unable to tolerate topical anesthesia; If required for long term use (extrinsic compression by tumor, stricture), stents should be changed every 6 months  What to expect while stent is in place Blood in the urine intermittently while stent is in place. Usually it is most severe the first few days after surgery, but it can persist the entire time that the stent is in place. Symptoms of urinary frequency and urgency that is caused by the lower end of the stent coiling into and irritating the bladder.  Precautions: Do not have sexual intercourse while stent is in place or participate in other strenuous activity.  Please notify your physician's office as soon as possible  if: your stent falls out. It is very rare but if the stent comes out, please rinse it with water, place it in a ziplock bag and bring it with you to your appointment. you are experiencing extreme pain, prolonged nausea/vomiting or fever >100.5 [facility] [address] [place], [place] [address] [phone]

## 2015-04-08 NOTE — Progress Notes (Signed)
04/08/2015 1:14 PM   [name] 11-12-1940 [id number]  Referring provider: [name], MD [address] [place], [place] [address]  Chief Complaint  Patient presents with  . Other    pain in kidney    HPI: Patient is a 74yo male s/p cystoscopy, transurethral resection of bladder tumor (4 cm), bilateral retrograde pyelogramwith interpretation, right ureteral stent placement by Dr. [name] on 04/06/15 presenting today with c/o right flank pain beginning this morning. He was seen yesterday in our office with complaints of urinary retention. A Foley catheter was placed for bladder decompression and he has scheduled follow-up on Monday for a voiding trial. He was also started on Rapaflo.   No fevers or chills.  Foley catheter draining well.   PMH: Past Medical History  Diagnosis Date  . Colon polyps     next colonoscopy due around 2018  . HTN (hypertension)   . Alcohol abuse, in remission     remote  . Benign essential tremor     improved on metoprolol and gabapentin  . Dyslipidemia     mild off meds  . Chronic low back pain     MRI 2004, multilevel DDD  . BPH (benign prostatic hypertrophy) 12/30/2012  . Personal history of tobacco use, presenting hazards to health 02/18/2015    quit 06/2011, restarted 2014  . Multiple pulmonary nodules 02/2015  . COPD (chronic obstructive pulmonary disease) ([place])   . GERD (gastroesophageal reflux disease)   . Arthritis     BACK AND NECK  . Shortness of breath dyspnea     OCC WITH EXERTION  . Complication of anesthesia     DID NOT GET COMPLETELY NUMB WITH TONSILLECTOMY  . Cardiac arrhythmia 03/2010    h/o a flutter and CM, s/p ablation, normal stress test    Surgical History: Past Surgical History  Procedure Laterality Date  . Hemorroidectomy  1982  . A flutter ablation  03/2010  . Cataract extraction    . Carotid US  03/2010    WNL  . Colonoscopy  09/2011    polyps, rpt due 5 yrs, mild diverticulosis [name])  .  Tonsillectomy  1964  . Electromagnetic navigation brochoscopy N/A 03/02/2015    Procedure: ELECTROMAGNETIC NAVIGATION BRONCHOSCOPY;  Surgeon: [name], MD;  Location: [facility] ORS;  Service: Cardiopulmonary;  Laterality: N/A;  . Transurethral resection of bladder tumor N/A 04/06/2015    Procedure: TRANSURETHRAL RESECTION OF BLADDER TUMOR (TURBT) right ureteral stent placement;  Surgeon: [name], MD;  Location: [facility] ORS;  Service: Urology;  Laterality: N/A;  . Cystoscopy w/ retrogrades Bilateral 04/06/2015    Procedure: CYSTOSCOPY WITH RETROGRADE PYELOGRAM;  Surgeon: [name], MD;  Location: [facility] ORS;  Service: Urology;  Laterality: Bilateral;    Home Medications:    Medication List       This list is accurate as of: 04/08/15  1:14 PM.  Always use your most recent med list.               aspirin EC 81 MG tablet  Take 81 mg by mouth daily.     ciprofloxacin 500 MG tablet  Commonly known as:  CIPRO  Take 1 tablet (500 mg total) by mouth 2 (two) times daily.     famotidine 10 MG tablet  Commonly known as:  PEPCID  Take 10 mg by mouth as needed for heartburn or indigestion.     gabapentin 100 MG capsule  Commonly known as:  NEURONTIN  TAKE  1 CAPSULE BY MOUTH TWICE A DAY AS NEEDED     HYDROcodone-acetaminophen 5-325 MG tablet  Commonly known as:  NORCO  Take 1 tablet by mouth every 6 (six) hours as needed for moderate pain.     lisinopril 10 MG tablet  Commonly known as:  PRINIVIL,ZESTRIL  Take 1 tablet (10 mg total) by mouth daily.     metoprolol tartrate 25 MG tablet  Commonly known as:  LOPRESSOR  Take 0.5 tablets (12.5 mg total) by mouth 2 (two) times daily.     naproxen sodium 220 MG tablet  Commonly known as:  ANAPROX  Take 220 mg by mouth 2 (two) times daily with a meal.     oxybutynin 5 MG tablet  Commonly known as:  DITROPAN  2 times daily as needed for bladder spasms (pain)     rosuvastatin 5 MG tablet  Commonly known as:  CRESTOR   Take 1 tablet (5 mg total) by mouth daily.        Allergies: No Known Allergies  Family History: Family History  Problem Relation Age of Onset  . Cervical cancer Mother 74  . Hypertension Father   . Stroke Brother 30  . Tremor Father     and several uncles/aunts (not parkinson's)  . Bladder Cancer Brother 48  . Prostate cancer Neg Hx   . Kidney cancer Neg Hx     Social History:  reports that he quit smoking about 4 weeks ago. His smoking use included Cigarettes. He has a 91.5 pack-year smoking history. He has never used smokeless tobacco. He reports that he does not drink alcohol or use illicit drugs.  ROS: UROLOGY Frequent Urination?: No Hard to postpone urination?: No Burning/pain with urination?: No Get up at night to urinate?: No Leakage of urine?: No Urine stream starts and stops?: No Trouble starting stream?: No Do you have to strain to urinate?: No Blood in urine?: Yes Urinary tract infection?: Yes Sexually transmitted disease?: No Injury to kidneys or bladder?: No Painful intercourse?: No Weak stream?: No Erection problems?: Yes Penile pain?: No  Gastrointestinal Nausea?: No Vomiting?: No Indigestion/heartburn?: Yes Diarrhea?: No Constipation?: Yes  Constitutional Fever: No Night sweats?: No Weight loss?: No Fatigue?: No  Skin Skin rash/lesions?: No Itching?: No  Eyes Blurred vision?: No Double vision?: No  Ears/Nose/Throat Sore throat?: No Sinus problems?: No  Hematologic/Lymphatic Swollen glands?: No Easy bruising?: No  Cardiovascular Leg swelling?: No Chest pain?: No  Respiratory Cough?: No Shortness of breath?: No  Endocrine Excessive thirst?: Yes  Musculoskeletal Back pain?: Yes Joint pain?: Yes  Neurological Headaches?: No Dizziness?: No  Psychologic Depression?: No Anxiety?: No  Physical Exam: BP 147/69 mmHg  Pulse 84  Temp(Src) 98.4 F (36.9 C)  Ht '5\' 8"'$  (1.727 m)  Wt 210 lb 14.4 oz (95.664 kg)  BMI  32.07 kg/m2  Constitutional:  Alert and oriented, No acute distress. HEENT: [place] AT, moist mucus membranes.  Trachea midline, no masses. Cardiovascular: No clubbing, cyanosis, or edema. Respiratory: Normal respiratory effort, no increased work of breathing. GI: Abdomen is soft, nontender, nondistended, no abdominal masses GU: No CVA tenderness.  Skin: No rashes, bruises or suspicious lesions. Lymph: No cervical adenopathy. Neurologic: Grossly intact, no focal deficits, moving all 4 extremities. Psychiatric: Normal mood and affect.  Laboratory Data:   Urinalysis    Component Value Date/Time   GLUCOSEU Negative 03/28/2015 1127   BILIRUBINUR Negative 03/28/2015 1127   NITRITE Negative 03/28/2015 1127   LEUKOCYTESUR Negative 03/28/2015 1127  Pertinent Imaging: CLINICAL DATA: Right flank pain, right ureteral stent placement  EXAM: RENAL / URINARY TRACT ULTRASOUND COMPLETE  COMPARISON: None.  FINDINGS: Right Kidney:  Length: 11.7 cm. No ureteral stent is visualized. Echogenicity within normal limits. No mass or hydronephrosis visualized.  Left Kidney:  Length: 11.5 cm. Echogenicity within normal limits. No mass or hydronephrosis visualized.  Bladder:  The bladder is decompressed by Foley catheter.  IMPRESSION: The right ureteral stent is not visualized. There is no hydronephrosis. The urinary bladder was decompressed due to a Foley catheter.   Electronically Signed  By: David Martinique M.D.  Assessment & Plan:    1. Post operative pain- stat renal ultrasound ordered to be performed today for confirmation of stent in position and evaluation for possible hydronephrosis.  12:38pm- Telephone encounter- Patient notified of normal RUS. He states that his pain has subsided since this morning. He has a follow-up appointment scheduled here on Monday. He was advised should he develop fevers or uncontrolled pain over the weekend he should present to the  emergency department.   There are no diagnoses linked to this encounter.  Return for as scheduled on Monday.  These notes generated with voice recognition software. I apologize for typographical errors.  Herbert Moors, [facility] [address], [place], [place] [address] [phone]

## 2015-04-09 ENCOUNTER — Encounter: Payer: Self-pay | Admitting: Family Medicine

## 2015-04-09 DIAGNOSIS — C679 Malignant neoplasm of bladder, unspecified: Secondary | ICD-10-CM | POA: Insufficient documentation

## 2015-04-11 ENCOUNTER — Ambulatory Visit (INDEPENDENT_AMBULATORY_CARE_PROVIDER_SITE_OTHER): Payer: Medicare Other

## 2015-04-11 ENCOUNTER — Telehealth: Payer: Self-pay | Admitting: *Deleted

## 2015-04-11 DIAGNOSIS — R339 Retention of urine, unspecified: Secondary | ICD-10-CM

## 2015-04-11 NOTE — Telephone Encounter (Signed)
  Oncology Nurse Navigator Documentation    Navigator Encounter Type: Telephone (04/11/15 1400)         Interventions: Coordination of Care (04/11/15 1400)

## 2015-04-11 NOTE — Progress Notes (Signed)
Fill and Pull Catheter Removal  Patient is present today for a catheter removal.  Patient was cleaned and prepped in a sterile fashion 135m of sterile water/ saline was instilled into the bladder when the patient felt the urge to urinate. 155mof water was then drained from the balloon.  A 20FR foley cath was removed from the bladder no complications were noted .  Patient as then given some time to void on their own.  Patient can void  10053mn their own after some time.  Patient tolerated well.  Preformed by: CheToniann FailPN  Follow up/ Additional notes: Pt RTC Thursday for stent removal. Reinforced with pt should he develop pain or unable to urinate to give us Koreacall and come back. Pt voiced understanding.

## 2015-04-13 NOTE — Discharge Summary (Signed)
Physician Discharge Summary  Patient ID: Joseph Graham MRN: [id number] DOB/AGE: [birth year] 74 y.o.  Admit date: 03/21/2015 Discharge date: 04/13/2015   Discharge Diagnoses:  Active Problems:   * No active hospital problems. *   Procedures: Insertion of right sided chest tube following iatrogenic pneumothorax  Hospital Course: This patient was admitted to the hospital after undergoing a lung biopsy by our interventional radiologist. He developed a pneumothorax and a chest tube was inserted. Patient was admitted to the hospital he continued to have an air leak over the next several days. Ultimately a Heimlich valve was placed on the patient and his chest x-ray showed a minimal pneumothorax and he was felt safe to be managed as an outpatient. This was performed. He will follow-up with Dr. Felton Clinton in the office in several days.  Disposition: 01-Home or Self Care  Discharge Instructions    Call MD for:  difficulty breathing, headache or visual disturbances    Complete by:  As directed      Call MD for:  redness, tenderness, or signs of infection (pain, swelling, redness, odor or green/yellow discharge around incision site)    Complete by:  As directed      Care order/instruction    Complete by:  As directed   PLace right chest tube to heimlich vavle and place that to foley bag,  Call me when that is all ready to be done .  Thanks     Diet general    Complete by:  As directed      Discharge wound care:    Complete by:  As directed   Dry dressing to chest tube site.     Increase activity slowly    Complete by:  As directed             Medication List    TAKE these medications        famotidine 10 MG tablet  Commonly known as:  PEPCID  Take 10 mg by mouth as needed for heartburn or indigestion.     gabapentin 100 MG capsule  Commonly known as:  NEURONTIN  TAKE 1 CAPSULE BY MOUTH TWICE A DAY AS NEEDED     lisinopril 10 MG tablet  Commonly known as:  PRINIVIL,ZESTRIL  Take 1  tablet (10 mg total) by mouth daily.     metoprolol tartrate 25 MG tablet  Commonly known as:  LOPRESSOR  Take 0.5 tablets (12.5 mg total) by mouth 2 (two) times daily.     rosuvastatin 5 MG tablet  Commonly known as:  CRESTOR  Take 1 tablet (5 mg total) by mouth daily.           Follow-up Information    Follow up with [name], MD. Call in 2 days.   Specialty:  Cardiothoracic Surgery   Why:  For follow-up    Contact information:   [place] [address] [phone]       Follow up with Sherri Rad, MD.   Specialties:  Surgery, Radiology   Contact information:   [address] [place] [address] [phone]       [name], MD

## 2015-04-14 ENCOUNTER — Ambulatory Visit (INDEPENDENT_AMBULATORY_CARE_PROVIDER_SITE_OTHER): Payer: Medicare Other | Admitting: Urology

## 2015-04-14 VITALS — BP 150/70 | HR 76 | Ht 68.0 in | Wt 207.8 lb

## 2015-04-14 DIAGNOSIS — D494 Neoplasm of unspecified behavior of bladder: Secondary | ICD-10-CM

## 2015-04-14 HISTORY — PX: OTHER SURGICAL HISTORY: SHX169

## 2015-04-14 LAB — URINALYSIS, COMPLETE
Bilirubin, UA: NEGATIVE
GLUCOSE, UA: NEGATIVE
KETONES UA: NEGATIVE
Nitrite, UA: NEGATIVE
SPEC GRAV UA: 1.025 (ref 1.005–1.030)
Urobilinogen, Ur: 0.2 mg/dL (ref 0.2–1.0)
pH, UA: 7 (ref 5.0–7.5)

## 2015-04-14 LAB — MICROSCOPIC EXAMINATION
Epithelial Cells (non renal): NONE SEEN /hpf (ref 0–10)
RBC, UA: >30 /hpf — AB (ref 0–?)
Renal Epithel, UA: NONE SEEN /hpf

## 2015-04-14 MED ORDER — CIPROFLOXACIN HCL 500 MG PO TABS
500.0000 mg | ORAL_TABLET | Freq: Once | ORAL | Status: AC
  Administered 2015-04-14: 500 mg via ORAL

## 2015-04-14 MED ORDER — LIDOCAINE HCL 2 % EX GEL
1.0000 "application " | Freq: Once | CUTANEOUS | Status: AC
  Administered 2015-04-14: 1 via URETHRAL

## 2015-04-14 NOTE — Progress Notes (Addendum)
04/14/2015 9:02 AM   Joseph Graham [birth year] [id number]  Referring provider: [name], MD [address] [place], [place] [address]  Chief Complaint  Patient presents with  . Cysto Stent Removal    HPI: The patient is a 74 year old gentleman who has both lung and bladder lesions. He had a 2-3 cm exophytic lesion in the midline posterior bladder wall superior to the trigone during office cystoscopy at his last visit. At that time, there was more concern about his lung lesion, so we planned to have his lung lesion addressed first with the TURBT to follow lung surgery . He had a lung biopsy which showed poorly differentiated carcinoma of unclear origin. There is concern it may be metastasis from the bladder, so he was sent back now for resection of his bladder tumor as well as to get a tissue specimen to determine the next step in his treatment. He has since undergone TURBT. His pathology shows noninvasive high-grade urothelial carcinoma. Is unclear TA or T1 disease based on the pathology report. There was no muscle in the specimen. It is a different carcinoma morphologically from his lung cancer. It is not clear at this time whether here not receiving chemotherapy as part of his undifferentiated carcinoma of the lung treatment.   PMH: Past Medical History  Diagnosis Date  . Colon polyps     next colonoscopy due around 2018  . HTN (hypertension)   . Alcohol abuse, in remission     remote  . Benign essential tremor     improved on metoprolol and gabapentin  . Dyslipidemia     mild off meds  . Chronic low back pain     MRI 2004, multilevel DDD  . BPH (benign prostatic hypertrophy) 12/30/2012  . Personal history of tobacco use, presenting hazards to health 02/18/2015    quit 06/2011, restarted 2014  . Multiple pulmonary nodules 02/2015  . COPD (chronic obstructive pulmonary disease) ([place])   . GERD (gastroesophageal reflux disease)   . Arthritis     BACK AND NECK  .  Shortness of breath dyspnea     OCC WITH EXERTION  . Complication of anesthesia     DID NOT GET COMPLETELY NUMB WITH TONSILLECTOMY  . Cardiac arrhythmia 03/2010    h/o a flutter and CM, s/p ablation, normal stress test  . Primary cancer of bladder ([place]) 2016    Joseph Graham  . Primary lung cancer ([place]) 2016    Surgical History: Past Surgical History  Procedure Laterality Date  . Hemorroidectomy  1982  . A flutter ablation  03/2010  . Cataract extraction    . Carotid US  03/2010    WNL  . Colonoscopy  09/2011    polyps, rpt due 5 yrs, mild diverticulosis [name])  . Tonsillectomy  1964  . Electromagnetic navigation brochoscopy N/A 03/02/2015    Procedure: ELECTROMAGNETIC NAVIGATION BRONCHOSCOPY;  Surgeon: [name], MD;  Location: [facility] ORS;  Service: Cardiopulmonary;  Laterality: N/A;  . Transurethral resection of bladder tumor N/A 04/06/2015    Procedure: TRANSURETHRAL RESECTION OF BLADDER TUMOR (TURBT) right ureteral stent placement;  Surgeon: [name], MD;  Location: [facility] OR[facility];  Service: Urology;  Laterality: N/A;  . Cystoscopy w/ retrogrades Bilateral 04/06/2015    Procedure: CYSTOSCOPY WITH RETROGRADE PYELOGRAM;  Surgeon: [name], MD;  Location: [facility];  Service: Urology;  Laterality: Bilateral;    Home Medications:    Medication List       This list is  accurate as of: 04/14/15  9:02 AM.  Always use your most recent med list.               aspirin EC 81 MG tablet  Take 81 mg by mouth daily.     famotidine 10 MG tablet  Commonly known as:  PEPCID  Take 10 mg by mouth as needed for heartburn or indigestion.     gabapentin 100 MG capsule  Commonly known as:  NEURONTIN  TAKE 1 CAPSULE BY MOUTH TWICE A DAY AS NEEDED     HYDROcodone-acetaminophen 5-325 MG tablet  Commonly known as:  NORCO  Take 1 tablet by mouth every 6 (six) hours as needed for moderate pain.     lisinopril 10 MG tablet  Commonly known as:  PRINIVIL,ZESTRIL  Take 1 tablet (10  mg total) by mouth daily.     metoprolol tartrate 25 MG tablet  Commonly known as:  LOPRESSOR  Take 0.5 tablets (12.5 mg total) by mouth 2 (two) times daily.     naproxen sodium 220 MG tablet  Commonly known as:  ANAPROX  Take 220 mg by mouth 2 (two) times daily with a meal.     oxybutynin 5 MG tablet  Commonly known as:  DITROPAN  2 times daily as needed for bladder spasms (pain)     rosuvastatin 5 MG tablet  Commonly known as:  CRESTOR  Take 1 tablet (5 mg total) by mouth daily.        Allergies: No Known Allergies  Family History: Family History  Problem Relation Age of Onset  . Cervical cancer Mother 2  . Hypertension Father   . Stroke Brother 36  . Tremor Father     and several uncles/aunts (not parkinson's)  . Bladder Cancer Brother 31  . Prostate cancer Neg Hx   . Kidney cancer Neg Hx     Social History:  reports that he quit smoking about 5 weeks ago. His smoking use included Cigarettes. He has a 91.5 pack-year smoking history. He has never used smokeless tobacco. He reports that he does not drink alcohol or use illicit drugs.  ROS:                                        Physical Exam: BP 150/70 mmHg  Pulse 76  Ht '5\' 8"'$  (1.727 m)  Wt 207 lb 12.8 oz (94.257 kg)  BMI 31.60 kg/m2  Constitutional:  Alert and oriented, No acute distress. HEENT: [place] AT, moist mucus membranes.  Trachea midline, no masses. Cardiovascular: No clubbing, cyanosis, or edema. Respiratory: Normal respiratory effort, no increased work of breathing. GI: Abdomen is soft, nontender, nondistended, no abdominal masses GU: No CVA tenderness.  Skin: No rashes, bruises or suspicious lesions. Lymph: No cervical or inguinal adenopathy. Neurologic: Grossly intact, no focal deficits, moving all 4 extremities. Psychiatric: Normal mood and affect.  Laboratory Data: Lab Results  Component Value Date   WBC 9.5 03/22/2015   HGB 13.6 03/22/2015   HCT 40.0 03/22/2015    MCV 94.4 03/22/2015   PLT 221 03/22/2015    Lab Results  Component Value Date   CREATININE 0.93 03/22/2015    Lab Results  Component Value Date   PSA 0.60 01/24/2015   PSA 0.68 12/31/2013   PSA 0.67 12/30/2012    No results found for: TESTOSTERONE  Lab Results  Component Value Date  HGBA1C 6.3 01/24/2015    Urinalysis    Component Value Date/Time   GLUCOSEU Negative 03/28/2015 1127   BILIRUBINUR Negative 03/28/2015 1127   NITRITE Negative 03/28/2015 1127   LEUKOCYTESUR Negative 03/28/2015 1127     Cystoscopy Procedure Note  Patient identification was confirmed, informed consent was obtained, and patient was prepped using Betadine solution.  Lidocaine jelly was administered per urethral meatus.    Preoperative abx where received prior to procedure.     Pre-Procedure: - Inspection reveals a normal caliber ureteral meatus.  Procedure: The flexible cystoscope was introduced without difficulty - No urethral strictures/lesions are present. -Stent was grasped and successfully removed intact.  Post-Procedure: - Patient tolerated the procedure well   Assessment & Plan:    1. Bladder tumor The patient has non-invasive high-grade urothelial carcinoma with no muscle in the specimen. I discussed with the patient that the standard of care would be to perform a reresection followed by BCG induction and maintenance therapy. However this is complicated by the fact that he is about to undergo treatment for undifferentiated lung carcinoma. It is unclear if he is going to need chemotherapy. He is expected to start treatment for that next week. Due to this, we will have him follow-up in 4 weeks to assess whether he is receiving chemotherapy. If he is only going to receive radiation, we will plan for re-resection as soon as his radiation ends is medically cleared for surgery with BCG to follow. He may be a candidate for intravesical mitomycin if he is receiving chemotherapy at the  time as follow-up.   2. Meatal stenosis This was not addressed during this appointment. However he will be called into pickup a dilation device and perform daily dilations over the next month of his meatus.    Return in about 4 weeks (around 05/12/2015).  [name], MD  [facility] [address] [place], [place] [address] [phone]

## 2015-04-15 ENCOUNTER — Inpatient Hospital Stay: Payer: Medicare Other | Attending: Internal Medicine | Admitting: Internal Medicine

## 2015-04-15 ENCOUNTER — Encounter: Payer: Self-pay | Admitting: Internal Medicine

## 2015-04-15 ENCOUNTER — Encounter: Payer: Self-pay | Admitting: *Deleted

## 2015-04-15 VITALS — BP 163/75 | HR 74 | Temp 97.3°F | Resp 20 | Ht 68.0 in | Wt 211.0 lb

## 2015-04-15 DIAGNOSIS — C3411 Malignant neoplasm of upper lobe, right bronchus or lung: Secondary | ICD-10-CM | POA: Diagnosis not present

## 2015-04-15 DIAGNOSIS — K219 Gastro-esophageal reflux disease without esophagitis: Secondary | ICD-10-CM | POA: Diagnosis not present

## 2015-04-15 DIAGNOSIS — M129 Arthropathy, unspecified: Secondary | ICD-10-CM | POA: Diagnosis not present

## 2015-04-15 DIAGNOSIS — J449 Chronic obstructive pulmonary disease, unspecified: Secondary | ICD-10-CM | POA: Diagnosis not present

## 2015-04-15 DIAGNOSIS — I10 Essential (primary) hypertension: Secondary | ICD-10-CM | POA: Diagnosis not present

## 2015-04-15 DIAGNOSIS — N400 Enlarged prostate without lower urinary tract symptoms: Secondary | ICD-10-CM

## 2015-04-15 DIAGNOSIS — Z79899 Other long term (current) drug therapy: Secondary | ICD-10-CM | POA: Diagnosis not present

## 2015-04-15 DIAGNOSIS — Z809 Family history of malignant neoplasm, unspecified: Secondary | ICD-10-CM | POA: Diagnosis not present

## 2015-04-15 DIAGNOSIS — E785 Hyperlipidemia, unspecified: Secondary | ICD-10-CM | POA: Diagnosis not present

## 2015-04-15 DIAGNOSIS — F1021 Alcohol dependence, in remission: Secondary | ICD-10-CM

## 2015-04-15 DIAGNOSIS — F17210 Nicotine dependence, cigarettes, uncomplicated: Secondary | ICD-10-CM

## 2015-04-15 DIAGNOSIS — Z7982 Long term (current) use of aspirin: Secondary | ICD-10-CM

## 2015-04-15 DIAGNOSIS — Z86010 Personal history of colonic polyps: Secondary | ICD-10-CM | POA: Diagnosis not present

## 2015-04-15 DIAGNOSIS — M545 Low back pain: Secondary | ICD-10-CM | POA: Diagnosis not present

## 2015-04-15 DIAGNOSIS — C679 Malignant neoplasm of bladder, unspecified: Secondary | ICD-10-CM | POA: Diagnosis not present

## 2015-04-15 DIAGNOSIS — R918 Other nonspecific abnormal finding of lung field: Secondary | ICD-10-CM

## 2015-04-15 DIAGNOSIS — G8929 Other chronic pain: Secondary | ICD-10-CM | POA: Diagnosis not present

## 2015-04-15 NOTE — Progress Notes (Signed)
[place] NOTE  Patient Care Team: [name], MD as PCP - General (Family Medicine)  CHIEF COMPLAINTS/PURPOSE OF CONSULTATION:   # OCT 2016- RUL POORLY DIFF CA [clinically non-small cell] ; STAGE IIB [T3-2 separate nodules in RUL; N0] [s/p CT guided bx]- Plan RT  # OCT 2016- 2 sub cm nodules in Left LL- monitor for now.   # OCT 2016-NON-invasive bladder urothelial cancer; high grade [Dr. Budzyn]  # Pneumothorax [oct 2016]   HISTORY OF PRESENTING ILLNESS:  Joseph Graham 74 y.o. male with a history of long-standing smoking/COPD- noted to have lung nodules x2 right upper lobe on screening CAT scan- finally underwent CT-guided biopsy of the right lung mass. The biopsy- squamous cell carcinoma of the lung. Unfortunately, the biopsy was compensated by pneumothorax/chest replacement.  Given also the bladder mass noted incidentally on imaging- he underwent cystoscopy; biopsy noninvasive urothelial cancer.  Patient has recovered well post biopsy. He feels good. Denies any unusual aches and pains.   Patient is awaiting simulation next week with radiation.  ROS: A complete 10 point review of system is done which is negative for mentioned above in history of present illness.    MEDICAL HISTORY:  Past Medical History  Diagnosis Date  . Colon polyps     next colonoscopy due around 2018  . HTN (hypertension)   . Alcohol abuse, in remission     remote  . Benign essential tremor     improved on metoprolol and gabapentin  . Dyslipidemia     mild off meds  . Chronic low back pain     MRI 2004, multilevel DDD  . BPH (benign prostatic hypertrophy) 12/30/2012  . Personal history of tobacco use, presenting hazards to health 02/18/2015    quit 06/2011, restarted 2014  . Multiple pulmonary nodules 02/2015  . COPD (chronic obstructive pulmonary disease) ([place])   . GERD (gastroesophageal reflux disease)   . Arthritis     BACK AND NECK  . Shortness of breath  dyspnea     OCC WITH EXERTION  . Complication of anesthesia     DID NOT GET COMPLETELY NUMB WITH TONSILLECTOMY  . Cardiac arrhythmia 03/2010    h/o a flutter and CM, s/p ablation, normal stress test  . Primary cancer of bladder ([place]) 2016    Budzyn  . Primary lung cancer ([place]) 2016    SURGICAL HISTORY: Past Surgical History  Procedure Laterality Date  . Hemorroidectomy  1982  . A flutter ablation  03/2010  . Cataract extraction    . Carotid US  03/2010    WNL  . Colonoscopy  09/2011    polyps, rpt due 5 yrs, mild diverticulosis [name])  . Tonsillectomy  1964  . Electromagnetic navigation brochoscopy N/A 03/02/2015    Procedure: ELECTROMAGNETIC NAVIGATION BRONCHOSCOPY;  Surgeon: [name], MD;  Location: [facility] OR[facility];  Service: Cardiopulmonary;  Laterality: N/A;  . Transurethral resection of bladder tumor N/A 04/06/2015    Procedure: TRANSURETHRAL RESECTION OF BLADDER TUMOR (TURBT) right ureteral stent placement;  Surgeon: [name], MD;  Location: [facility] OR[facility];  Service: Urology;  Laterality: N/A;  . Cystoscopy w/ retrogrades Bilateral 04/06/2015    Procedure: CYSTOSCOPY WITH RETROGRADE PYELOGRAM;  Surgeon: [name], MD;  Location: [facility] OR[facility];  Service: Urology;  Laterality: Bilateral;  . Urinary stent removal  04/14/15    SOCIAL HISTORY: Patient is retired from Press photographer. He lives in [place]. Social History   Social History  . Marital Status: Married  Spouse Name: N/A  . Number of Children: N/A  . Years of Education: N/A   Occupational History  . Not on file.   Social History Main Topics  . Smoking status: Former Smoker -- 1.50 packs/day for 61 years    Types: Cigarettes    Quit date: 03/08/2015  . Smokeless tobacco: Never Used     Comment: cut back 0.5 cigarettes--stopped 03/07/15; now now chantix  . Alcohol Use: No     Comment: has not drank in 37 years  . Drug Use: No  . Sexual Activity: Not on file   Other Topics Concern  . Not on file    Social History Narrative   Caffeine: 1 cup decaf/day   Lives with wife   Occupation: retired, worked Press photographer   Activity: gym 3x/wk, Immunologist, hobbies (paint, building things)   Diet: healthy, good water, fruits/vegetables daily, red meat 1x/wk    FAMILY HISTORY: Family History  Problem Relation Age of Onset  . Cervical cancer Mother 7  . Hypertension Father   . Stroke Brother 56  . Tremor Father     and several uncles/aunts (not parkinson's)  . Bladder Cancer Brother 78  . Prostate cancer Neg Hx   . Kidney cancer Neg Hx     ALLERGIES:  has No Known Allergies.  MEDICATIONS:  Current Outpatient Prescriptions  Medication Sig Dispense Refill  . famotidine (PEPCID) 10 MG tablet Take 10 mg by mouth 2 (two) times daily.     Marland Kitchen gabapentin (NEURONTIN) 100 MG capsule TAKE 1 CAPSULE BY MOUTH TWICE A DAY AS NEEDED (Patient taking differently: Take 100 mg by mouth 2 (two) times daily. TAKE 1 CAPSULE BY MOUTH TWICE A DAY) 180 capsule 3  . lisinopril (PRINIVIL,ZESTRIL) 10 MG tablet Take 1 tablet (10 mg total) by mouth daily. (Patient taking differently: Take 10 mg by mouth every evening. ) 90 tablet 3  . metoprolol tartrate (LOPRESSOR) 25 MG tablet Take 0.5 tablets (12.5 mg total) by mouth 2 (two) times daily. 90 tablet 3  . naproxen sodium (ANAPROX) 220 MG tablet Take 220 mg by mouth 2 (two) times daily with a meal.    . rosuvastatin (CRESTOR) 5 MG tablet Take 1 tablet (5 mg total) by mouth daily. 90 tablet 3  . aspirin EC 81 MG tablet Take 81 mg by mouth daily.     No current facility-administered medications for this visit.  Marland Kitchen  PHYSICAL EXAMINATION: ECOG PERFORMANCE STATUS: 0 - Asymptomatic  Filed Vitals:   04/15/15 0937  BP: 163/75  Pulse: 74  Temp: 97.3 F (36.3 C)  Resp: 20   Filed Weights   04/15/15 0937  Weight: 210 lb 15.7 oz (95.7 kg)    GENERAL:alert, no distress and comfortable. He is accompanied by his wife.  SKIN: skin color, texture, turgor are normal, no  rashes or significant lesions EYES: normal, conjunctiva are pink and non-injected, sclera clear OROPHARYNX:no exudate, no erythema and lips, buccal mucosa, and tongue normal  NECK: supple, thyroid normal size, non-tender, without nodularity LYMPH:  no palpable lymphadenopathy in the cervical, axillary or inguinal LUNGS: clear to auscultation and percussion with normal breathing effort HEART: regular rate & rhythm and no murmurs and no lower extremity edema ABDOMEN:abdomen soft, non-tender and normal bowel sounds Musculoskeletal:no cyanosis of digits and no clubbing  PSYCH: alert & oriented x 3 with fluent speech NEURO: no focal motor/sensory deficits  LABORATORY DATA:  I have reviewed the data as listed Lab Results  Component Value Date  WBC 9.5 03/22/2015   HGB 13.6 03/22/2015   HCT 40.0 03/22/2015   MCV 94.4 03/22/2015   PLT 221 03/22/2015    Recent Labs  01/24/15 0818 03/21/15 1744 03/22/15 0504  NA 140 139 137  K 4.3 4.4 4.5  CL 102 102 101  CO2 33* 29 28  GLUCOSE 113* 120* 106*  BUN 26* 34* 33*  CREATININE 0.79 0.99 0.93  CALCIUM 9.1 9.3 9.0  GFRNONAA  --  >60 >60  GFRAA  --  >60 >60  PROT  --   --  7.5  ALBUMIN  --   --  4.2  AST  --   --  29  ALT  --   --  29  ALKPHOS  --   --  80  BILITOT  --   --  0.6    RADIOGRAPHIC STUDIES: I have personally reviewed the radiological images as listed and agreed with the findings in the report. Dg Chest 2 View  04/01/2015  CLINICAL DATA:  Pneumothorax. EXAM: CHEST  2 VIEW COMPARISON:  03/28/2015. FINDINGS: Right chest tube in stable position. Near complete interim resolution of right pneumothorax with minimal residual. Postsurgical changes right upper lobe. Mild bibasilar atelectasis. Cardiomegaly. No pleural effusion pneumothorax. No acute bony abnormality. IMPRESSION: 1. Right chest tube in stable position. Near complete interim resolution of right pneumothorax with minimal right apical residual. 2. Postsurgical changes  right upper lobe. Mild bibasilar subsegmental atelectasis. 3.  Stable cardiomegaly Electronically Signed   By: Marcello Moores  Register   On: 04/01/2015 08:39   Dg Chest 2 View  03/28/2015  CLINICAL DATA:  Spontaneous pneumothorax status post chest tube EXAM: CHEST  2 VIEW COMPARISON:  03/23/2015 chest radiograph FINDINGS: Right basilar pigtail pleural catheter is in place. Surgical clips overlie the right upper chest. Stable cardiomediastinal silhouette with normal heart size. There is a persistent small right apical pneumothorax, mildly decreased. No left pneumothorax. No pleural effusion. Clear lungs, with no focal lung consolidation and no pulmonary edema. IMPRESSION: Persistent small right apical pneumothorax, mildly decreased. Electronically Signed   By: Ilona Sorrel M.D.   On: 03/28/2015 12:37   US Renal  04/08/2015  CLINICAL DATA:  Right flank pain, right ureteral stent placement EXAM: RENAL / URINARY TRACT ULTRASOUND COMPLETE COMPARISON:  None. FINDINGS: Right Kidney: Length: 11.7 cm. No ureteral stent is visualized. Echogenicity within normal limits. No mass or hydronephrosis visualized. Left Kidney: Length: 11.5 cm. Echogenicity within normal limits. No mass or hydronephrosis visualized. Bladder: The bladder is decompressed by Foley catheter. IMPRESSION: The right ureteral stent is not visualized. There is no hydronephrosis. The urinary bladder was decompressed due to a Foley catheter. Electronically Signed   By: David  Martinique M.D.   On: 04/08/2015 11:52   Ct Biopsy  03/21/2015  CLINICAL DATA:  Right upper lobe lung mass. EXAM: CT GUIDED core BIOPSY OF right upper lobe lung mass. ANESTHESIA/SEDATION: 1.0  Mg IV Versed; 50 mcg IV Fentanyl Total Moderate Sedation Time: 45 minutes. PROCEDURE: The procedure risks, benefits, and alternatives were explained to the patient. Questions regarding the procedure were encouraged and answered. The patient understands and consents to the procedure. The right  posterior chest was prepped with chlorhexidinein a sterile fashion, and a sterile drape was applied covering the operative field. Sterile gloves were used for the procedure. Local anesthesia was provided with 1% Lidocaine. Under CT guidance, 17 gauge guiding needle was directed toward lesion in the posterior portion of the right upper lobe. Three core samples  were obtained using 18 gauge biopsy needle. The samples were placed in formalin vial and delivered to pathology. However, follow-up imaging demonstrates the patient with large pneumothorax. Decision was made to proceed with chest tube placement. Complications: Large pneumothorax developed. FINDINGS: Right upper lobe lung mass is noted. IMPRESSION: Under CT guidance, percutaneous core biopsy of right upper lobe lung mass was performed. Large pneumothorax developed immediately and chest tube placement will be performed. Electronically Signed   By: Marijo Conception, M.D.   On: 03/21/2015 13:37   Dg Chest Port 1 View  03/23/2015  CLINICAL DATA:  Assess pneumothorax on right EXAM: PORTABLE CHEST - 1 VIEW COMPARISON:  03/23/2015 FINDINGS: Cardiac shadow is stable. The lungs are well aerated bilaterally. A small right apical pneumothorax is again noted. It appears decreased in size when compare with the prior exam. A small bore chest tube is noted along the lateral aspect of the right chest. IMPRESSION: Decrease in right apical pneumothorax. Electronically Signed   By: Inez Catalina M.D.   On: 03/23/2015 14:34   Dg Chest Port 1 View  03/23/2015  CLINICAL DATA:  Right-sided chest tube placement. EXAM: PORTABLE CHEST 1 VIEW COMPARISON:  March 22, 2015. FINDINGS: Stable cardiomediastinal silhouette. Left lung is clear. Mild right apical pneumothorax is noted which has increased in size compared to prior exam. Right-sided chest tube is unchanged in position, being lateral in the right lung base. Right upper lobe lung mass is noted. IMPRESSION: Mild right apical  pneumothorax is noted which is increased in size compared to prior exam. Right-sided chest tube is unchanged in position. Electronically Signed   By: Marijo Conception, M.D.   On: 03/23/2015 07:32   Dg Chest Port 1 View  03/22/2015  CLINICAL DATA:  Pneumothorax. EXAM: PORTABLE CHEST 1 VIEW COMPARISON:  03/21/2015. FINDINGS: Right chest tube has withdrawn, its tip is along the right lateral pleural border and/or chest wall. Tiny right apical pneumothorax is stable. Pulmonary nodule with adjacent clips noted. Low lung volumes with basilar atelectasis. Cardiomegaly with normal pulmonary vascularity. Right chest wall mild subcutaneous emphysema . IMPRESSION: 1. Right chest tube has withdrawn, its tip is along the right lateral pleural border and/or chest wall. Small right pneumothorax is stable. Mild right chest wall subcutaneous emphysema. 2. Pulmonary nodule in the right apex again noted. There are adjacent surgical clips. 3. Low lung volumes with persistent bibasilar atelectasis. 4. Stable cardiomegaly.  No pulmonary venous congestion. Critical Value/emergent results were called by telephone at the time of interpretation on 03/22/2015 at 7:47 am to nurse Kenney Houseman , who verbally acknowledged these results. Electronically Signed   By: Marcello Moores  Register   On: 03/22/2015 07:51   Dg Chest Port 1 View  03/21/2015  CLINICAL DATA:  Right pneumothorax after biopsy of right lung lesion EXAM: PORTABLE CHEST 1 VIEW COMPARISON:  CT chest biopsy images of 03/21/2015 and CT chest of 02/18/2015 FINDINGS: Only a a small right pneumothorax remains after right chest tube insertion, of approximately 5-10%. The nodule in the right upper lobe is visualized with 3 metallic clips overlying that region. The left lung is clear. Cardiomegaly is stable. IMPRESSION: Decrease in right pneumothorax after right chest tube insertion. Electronically Signed   By: Ivar Drape M.D.   On: 03/21/2015 15:16   Ct Image Guided Drainage By Percutaneous  Catheter  03/21/2015  CLINICAL DATA:  Right-sided pneumothorax after biopsy. EXAM: CT GUIDED DRAINAGE OF right-sided pneumothorax after biopsy. ANESTHESIA/SEDATION: Please see CT biopsy report of same day.  PROCEDURE: Patient underwent CT-guided biopsy of right upper lobe lung mass and developed immediate large pneumothorax. Decision was made to proceed with CT-guided placement of drainage catheter into right pleural space. Patient was placed into supine position. The right mid-axillary line of the chest was prepped with chlorhexidinein a sterile fashion, and a sterile drape was applied covering the operative field. Sterile gloves were used for the procedure. Local anesthesia was provided with 1% Lidocaine. Under CT guidance, 18 gauge needle was directed into right pleural space. Guidewire was placed followup by 8 [place] dilator. Then, 8.5 French drainage catheter was placed an directed into upper lobe region. Guidewire was removed and the catheter was hooked up to active suction. It was internally and externally secured as well. Followup CT imaging demonstrates significant improvement in large right-sided pneumothorax status post drainage catheter placement. COMPLICATIONS: Large right pneumothorax as described above. FINDINGS: Large right pneumothorax as described above. IMPRESSION: Under CT guidance, successful placement of 8.5 French drainage catheter into right pleural space for treatment of pneumothorax after right lung biopsy. Electronically Signed   By: Marijo Conception, M.D.   On: 03/21/2015 14:16    ASSESSMENT & PLAN:   # Right UL POORLY DIFF CA- T3 N0- proceed with radiation therapy. Patient likely to have simulation next week. We'll reevaluate the patient with imaging after finishing radiation.  # Subcentimeter right lower lobe lung nodules- question etiology/too small for the threshold of PET scan or biopsy. Again await on follow-up CT scan. If these are growing on follow-up CT scan- I would  recommend follow up chemo  # Noninvasive high-grade bladder cancer- will likely need future cystoscopies/possibly BCG as per urology.  .  All questions were answered. The patient knows to call the clinic with any problems, questions or concerns.   I spent 25 counseling the patient face to face. The total time spent in the appointment was 30 more than 50% was on counseling.     Cammie Sickle, MD 04/15/2015 9:57 AM

## 2015-04-15 NOTE — Progress Notes (Signed)
  Oncology Nurse Navigator Documentation    Navigator Encounter Type: Clinic/MDC (04/15/15 1400) Patient Visit Type: Follow-up (04/15/15 1400) Treatment Phase: Other (04/15/15 1400) Barriers/Navigation Needs: No barriers at this time (04/15/15 1400)   Interventions: Coordination of Care (04/15/15 1400)            Time Spent with Patient: 30 (04/15/15 1400)

## 2015-04-19 ENCOUNTER — Ambulatory Visit
Admission: RE | Admit: 2015-04-19 | Discharge: 2015-04-19 | Disposition: A | Discharge status: Home / Self Care | Payer: Medicare Other | Source: Ambulatory Visit | Attending: Radiation Oncology | Admitting: Radiation Oncology

## 2015-04-19 DIAGNOSIS — N400 Enlarged prostate without lower urinary tract symptoms: Secondary | ICD-10-CM | POA: Diagnosis not present

## 2015-04-19 DIAGNOSIS — I10 Essential (primary) hypertension: Secondary | ICD-10-CM | POA: Diagnosis not present

## 2015-04-19 DIAGNOSIS — C3411 Malignant neoplasm of upper lobe, right bronchus or lung: Secondary | ICD-10-CM | POA: Diagnosis not present

## 2015-04-19 DIAGNOSIS — J449 Chronic obstructive pulmonary disease, unspecified: Secondary | ICD-10-CM | POA: Diagnosis not present

## 2015-04-19 DIAGNOSIS — R918 Other nonspecific abnormal finding of lung field: Secondary | ICD-10-CM | POA: Diagnosis not present

## 2015-04-19 DIAGNOSIS — Z8052 Family history of malignant neoplasm of bladder: Secondary | ICD-10-CM | POA: Diagnosis not present

## 2015-04-19 DIAGNOSIS — Z510 Encounter for antineoplastic radiation therapy: Secondary | ICD-10-CM | POA: Diagnosis not present

## 2015-04-19 DIAGNOSIS — Z8049 Family history of malignant neoplasm of other genital organs: Secondary | ICD-10-CM | POA: Diagnosis not present

## 2015-04-19 DIAGNOSIS — I499 Cardiac arrhythmia, unspecified: Secondary | ICD-10-CM | POA: Diagnosis not present

## 2015-04-19 DIAGNOSIS — Z87891 Personal history of nicotine dependence: Secondary | ICD-10-CM | POA: Diagnosis not present

## 2015-04-25 ENCOUNTER — Other Ambulatory Visit: Payer: Self-pay | Admitting: *Deleted

## 2015-04-25 DIAGNOSIS — C3491 Malignant neoplasm of unspecified part of right bronchus or lung: Secondary | ICD-10-CM

## 2015-04-26 DIAGNOSIS — Z87891 Personal history of nicotine dependence: Secondary | ICD-10-CM | POA: Diagnosis not present

## 2015-04-26 DIAGNOSIS — I10 Essential (primary) hypertension: Secondary | ICD-10-CM | POA: Diagnosis not present

## 2015-04-26 DIAGNOSIS — I499 Cardiac arrhythmia, unspecified: Secondary | ICD-10-CM | POA: Diagnosis not present

## 2015-04-26 DIAGNOSIS — R918 Other nonspecific abnormal finding of lung field: Secondary | ICD-10-CM | POA: Diagnosis not present

## 2015-04-26 DIAGNOSIS — Z8049 Family history of malignant neoplasm of other genital organs: Secondary | ICD-10-CM | POA: Diagnosis not present

## 2015-04-26 DIAGNOSIS — Z510 Encounter for antineoplastic radiation therapy: Secondary | ICD-10-CM | POA: Diagnosis not present

## 2015-04-26 DIAGNOSIS — C3411 Malignant neoplasm of upper lobe, right bronchus or lung: Secondary | ICD-10-CM | POA: Diagnosis not present

## 2015-04-26 DIAGNOSIS — Z8052 Family history of malignant neoplasm of bladder: Secondary | ICD-10-CM | POA: Diagnosis not present

## 2015-04-26 DIAGNOSIS — J449 Chronic obstructive pulmonary disease, unspecified: Secondary | ICD-10-CM | POA: Diagnosis not present

## 2015-04-26 DIAGNOSIS — N400 Enlarged prostate without lower urinary tract symptoms: Secondary | ICD-10-CM | POA: Diagnosis not present

## 2015-04-28 ENCOUNTER — Ambulatory Visit
Admission: RE | Admit: 2015-04-28 | Discharge: 2015-04-28 | Disposition: A | Discharge status: Home / Self Care | Payer: Medicare Other | Source: Ambulatory Visit | Attending: Radiation Oncology | Admitting: Radiation Oncology

## 2015-04-28 DIAGNOSIS — Z510 Encounter for antineoplastic radiation therapy: Secondary | ICD-10-CM | POA: Diagnosis not present

## 2015-04-28 DIAGNOSIS — I499 Cardiac arrhythmia, unspecified: Secondary | ICD-10-CM | POA: Diagnosis not present

## 2015-04-28 DIAGNOSIS — Z8052 Family history of malignant neoplasm of bladder: Secondary | ICD-10-CM | POA: Diagnosis not present

## 2015-04-28 DIAGNOSIS — Z87891 Personal history of nicotine dependence: Secondary | ICD-10-CM | POA: Diagnosis not present

## 2015-04-28 DIAGNOSIS — N400 Enlarged prostate without lower urinary tract symptoms: Secondary | ICD-10-CM | POA: Diagnosis not present

## 2015-04-28 DIAGNOSIS — C3411 Malignant neoplasm of upper lobe, right bronchus or lung: Secondary | ICD-10-CM | POA: Diagnosis not present

## 2015-04-28 DIAGNOSIS — Z8049 Family history of malignant neoplasm of other genital organs: Secondary | ICD-10-CM | POA: Diagnosis not present

## 2015-04-28 DIAGNOSIS — R918 Other nonspecific abnormal finding of lung field: Secondary | ICD-10-CM | POA: Diagnosis not present

## 2015-04-28 DIAGNOSIS — I10 Essential (primary) hypertension: Secondary | ICD-10-CM | POA: Diagnosis not present

## 2015-04-28 DIAGNOSIS — J449 Chronic obstructive pulmonary disease, unspecified: Secondary | ICD-10-CM | POA: Diagnosis not present

## 2015-05-02 ENCOUNTER — Ambulatory Visit
Admission: RE | Admit: 2015-05-02 | Discharge: 2015-05-02 | Disposition: A | Discharge status: Home / Self Care | Payer: Medicare Other | Source: Ambulatory Visit | Attending: Radiation Oncology | Admitting: Radiation Oncology

## 2015-05-03 ENCOUNTER — Ambulatory Visit
Admission: RE | Admit: 2015-05-03 | Discharge: 2015-05-03 | Disposition: A | Discharge status: Home / Self Care | Payer: Medicare Other | Source: Ambulatory Visit | Attending: Radiation Oncology | Admitting: Radiation Oncology

## 2015-05-03 DIAGNOSIS — Z510 Encounter for antineoplastic radiation therapy: Secondary | ICD-10-CM | POA: Diagnosis not present

## 2015-05-03 DIAGNOSIS — Z8049 Family history of malignant neoplasm of other genital organs: Secondary | ICD-10-CM | POA: Diagnosis not present

## 2015-05-03 DIAGNOSIS — C3411 Malignant neoplasm of upper lobe, right bronchus or lung: Secondary | ICD-10-CM | POA: Diagnosis not present

## 2015-05-03 DIAGNOSIS — I10 Essential (primary) hypertension: Secondary | ICD-10-CM | POA: Diagnosis not present

## 2015-05-03 DIAGNOSIS — Z87891 Personal history of nicotine dependence: Secondary | ICD-10-CM | POA: Diagnosis not present

## 2015-05-03 DIAGNOSIS — J449 Chronic obstructive pulmonary disease, unspecified: Secondary | ICD-10-CM | POA: Diagnosis not present

## 2015-05-03 DIAGNOSIS — R918 Other nonspecific abnormal finding of lung field: Secondary | ICD-10-CM | POA: Diagnosis not present

## 2015-05-03 DIAGNOSIS — I499 Cardiac arrhythmia, unspecified: Secondary | ICD-10-CM | POA: Diagnosis not present

## 2015-05-03 DIAGNOSIS — N400 Enlarged prostate without lower urinary tract symptoms: Secondary | ICD-10-CM | POA: Diagnosis not present

## 2015-05-03 DIAGNOSIS — Z8052 Family history of malignant neoplasm of bladder: Secondary | ICD-10-CM | POA: Diagnosis not present

## 2015-05-04 ENCOUNTER — Ambulatory Visit
Admission: RE | Admit: 2015-05-04 | Discharge: 2015-05-04 | Disposition: A | Discharge status: Home / Self Care | Payer: Medicare Other | Source: Ambulatory Visit | Attending: Radiation Oncology | Admitting: Radiation Oncology

## 2015-05-04 DIAGNOSIS — R918 Other nonspecific abnormal finding of lung field: Secondary | ICD-10-CM | POA: Diagnosis not present

## 2015-05-04 DIAGNOSIS — Z8052 Family history of malignant neoplasm of bladder: Secondary | ICD-10-CM | POA: Diagnosis not present

## 2015-05-04 DIAGNOSIS — N400 Enlarged prostate without lower urinary tract symptoms: Secondary | ICD-10-CM | POA: Diagnosis not present

## 2015-05-04 DIAGNOSIS — C3411 Malignant neoplasm of upper lobe, right bronchus or lung: Secondary | ICD-10-CM | POA: Diagnosis not present

## 2015-05-04 DIAGNOSIS — I499 Cardiac arrhythmia, unspecified: Secondary | ICD-10-CM | POA: Diagnosis not present

## 2015-05-04 DIAGNOSIS — Z8049 Family history of malignant neoplasm of other genital organs: Secondary | ICD-10-CM | POA: Diagnosis not present

## 2015-05-04 DIAGNOSIS — I10 Essential (primary) hypertension: Secondary | ICD-10-CM | POA: Diagnosis not present

## 2015-05-04 DIAGNOSIS — Z510 Encounter for antineoplastic radiation therapy: Secondary | ICD-10-CM | POA: Diagnosis not present

## 2015-05-04 DIAGNOSIS — J449 Chronic obstructive pulmonary disease, unspecified: Secondary | ICD-10-CM | POA: Diagnosis not present

## 2015-05-04 DIAGNOSIS — Z87891 Personal history of nicotine dependence: Secondary | ICD-10-CM | POA: Diagnosis not present

## 2015-05-09 ENCOUNTER — Ambulatory Visit
Admission: RE | Admit: 2015-05-09 | Discharge: 2015-05-09 | Disposition: A | Discharge status: Home / Self Care | Payer: Medicare Other | Source: Ambulatory Visit | Attending: Radiation Oncology | Admitting: Radiation Oncology

## 2015-05-09 DIAGNOSIS — N400 Enlarged prostate without lower urinary tract symptoms: Secondary | ICD-10-CM | POA: Diagnosis not present

## 2015-05-09 DIAGNOSIS — R918 Other nonspecific abnormal finding of lung field: Secondary | ICD-10-CM | POA: Diagnosis not present

## 2015-05-09 DIAGNOSIS — I499 Cardiac arrhythmia, unspecified: Secondary | ICD-10-CM | POA: Diagnosis not present

## 2015-05-09 DIAGNOSIS — Z8052 Family history of malignant neoplasm of bladder: Secondary | ICD-10-CM | POA: Diagnosis not present

## 2015-05-09 DIAGNOSIS — C3411 Malignant neoplasm of upper lobe, right bronchus or lung: Secondary | ICD-10-CM | POA: Diagnosis not present

## 2015-05-09 DIAGNOSIS — J449 Chronic obstructive pulmonary disease, unspecified: Secondary | ICD-10-CM | POA: Diagnosis not present

## 2015-05-09 DIAGNOSIS — Z510 Encounter for antineoplastic radiation therapy: Secondary | ICD-10-CM | POA: Diagnosis not present

## 2015-05-09 DIAGNOSIS — I10 Essential (primary) hypertension: Secondary | ICD-10-CM | POA: Diagnosis not present

## 2015-05-09 DIAGNOSIS — Z87891 Personal history of nicotine dependence: Secondary | ICD-10-CM | POA: Diagnosis not present

## 2015-05-09 DIAGNOSIS — Z8049 Family history of malignant neoplasm of other genital organs: Secondary | ICD-10-CM | POA: Diagnosis not present

## 2015-05-10 ENCOUNTER — Ambulatory Visit
Admission: RE | Admit: 2015-05-10 | Discharge: 2015-05-10 | Disposition: A | Discharge status: Home / Self Care | Payer: Medicare Other | Source: Ambulatory Visit | Attending: Radiation Oncology | Admitting: Radiation Oncology

## 2015-05-10 DIAGNOSIS — R918 Other nonspecific abnormal finding of lung field: Secondary | ICD-10-CM | POA: Diagnosis not present

## 2015-05-10 DIAGNOSIS — Z8049 Family history of malignant neoplasm of other genital organs: Secondary | ICD-10-CM | POA: Diagnosis not present

## 2015-05-10 DIAGNOSIS — N400 Enlarged prostate without lower urinary tract symptoms: Secondary | ICD-10-CM | POA: Diagnosis not present

## 2015-05-10 DIAGNOSIS — Z8052 Family history of malignant neoplasm of bladder: Secondary | ICD-10-CM | POA: Diagnosis not present

## 2015-05-10 DIAGNOSIS — J449 Chronic obstructive pulmonary disease, unspecified: Secondary | ICD-10-CM | POA: Diagnosis not present

## 2015-05-10 DIAGNOSIS — Z510 Encounter for antineoplastic radiation therapy: Secondary | ICD-10-CM | POA: Diagnosis not present

## 2015-05-10 DIAGNOSIS — C3411 Malignant neoplasm of upper lobe, right bronchus or lung: Secondary | ICD-10-CM | POA: Diagnosis not present

## 2015-05-10 DIAGNOSIS — Z87891 Personal history of nicotine dependence: Secondary | ICD-10-CM | POA: Diagnosis not present

## 2015-05-10 DIAGNOSIS — I10 Essential (primary) hypertension: Secondary | ICD-10-CM | POA: Diagnosis not present

## 2015-05-10 DIAGNOSIS — I499 Cardiac arrhythmia, unspecified: Secondary | ICD-10-CM | POA: Diagnosis not present

## 2015-05-11 ENCOUNTER — Inpatient Hospital Stay: Payer: Medicare Other

## 2015-05-11 ENCOUNTER — Ambulatory Visit
Admission: RE | Admit: 2015-05-11 | Discharge: 2015-05-11 | Disposition: A | Discharge status: Home / Self Care | Payer: Medicare Other | Source: Ambulatory Visit | Attending: Radiation Oncology | Admitting: Radiation Oncology

## 2015-05-11 DIAGNOSIS — M545 Low back pain: Secondary | ICD-10-CM | POA: Diagnosis not present

## 2015-05-11 DIAGNOSIS — E785 Hyperlipidemia, unspecified: Secondary | ICD-10-CM | POA: Diagnosis not present

## 2015-05-11 DIAGNOSIS — Z8052 Family history of malignant neoplasm of bladder: Secondary | ICD-10-CM | POA: Diagnosis not present

## 2015-05-11 DIAGNOSIS — N400 Enlarged prostate without lower urinary tract symptoms: Secondary | ICD-10-CM | POA: Diagnosis not present

## 2015-05-11 DIAGNOSIS — I499 Cardiac arrhythmia, unspecified: Secondary | ICD-10-CM | POA: Diagnosis not present

## 2015-05-11 DIAGNOSIS — Z87891 Personal history of nicotine dependence: Secondary | ICD-10-CM | POA: Diagnosis not present

## 2015-05-11 DIAGNOSIS — J449 Chronic obstructive pulmonary disease, unspecified: Secondary | ICD-10-CM | POA: Diagnosis not present

## 2015-05-11 DIAGNOSIS — C3411 Malignant neoplasm of upper lobe, right bronchus or lung: Secondary | ICD-10-CM | POA: Diagnosis not present

## 2015-05-11 DIAGNOSIS — C679 Malignant neoplasm of bladder, unspecified: Secondary | ICD-10-CM | POA: Diagnosis not present

## 2015-05-11 DIAGNOSIS — K219 Gastro-esophageal reflux disease without esophagitis: Secondary | ICD-10-CM | POA: Diagnosis not present

## 2015-05-11 DIAGNOSIS — Z7982 Long term (current) use of aspirin: Secondary | ICD-10-CM | POA: Diagnosis not present

## 2015-05-11 DIAGNOSIS — R918 Other nonspecific abnormal finding of lung field: Secondary | ICD-10-CM | POA: Diagnosis not present

## 2015-05-11 DIAGNOSIS — F1021 Alcohol dependence, in remission: Secondary | ICD-10-CM | POA: Diagnosis not present

## 2015-05-11 DIAGNOSIS — C3491 Malignant neoplasm of unspecified part of right bronchus or lung: Secondary | ICD-10-CM

## 2015-05-11 DIAGNOSIS — Z8049 Family history of malignant neoplasm of other genital organs: Secondary | ICD-10-CM | POA: Diagnosis not present

## 2015-05-11 DIAGNOSIS — Z510 Encounter for antineoplastic radiation therapy: Secondary | ICD-10-CM | POA: Diagnosis not present

## 2015-05-11 DIAGNOSIS — Z809 Family history of malignant neoplasm, unspecified: Secondary | ICD-10-CM | POA: Diagnosis not present

## 2015-05-11 DIAGNOSIS — I10 Essential (primary) hypertension: Secondary | ICD-10-CM | POA: Diagnosis not present

## 2015-05-11 DIAGNOSIS — M129 Arthropathy, unspecified: Secondary | ICD-10-CM | POA: Diagnosis not present

## 2015-05-11 DIAGNOSIS — Z86010 Personal history of colonic polyps: Secondary | ICD-10-CM | POA: Diagnosis not present

## 2015-05-11 DIAGNOSIS — Z79899 Other long term (current) drug therapy: Secondary | ICD-10-CM | POA: Diagnosis not present

## 2015-05-11 DIAGNOSIS — G8929 Other chronic pain: Secondary | ICD-10-CM | POA: Diagnosis not present

## 2015-05-11 DIAGNOSIS — F17210 Nicotine dependence, cigarettes, uncomplicated: Secondary | ICD-10-CM | POA: Diagnosis not present

## 2015-05-11 LAB — CBC
HEMATOCRIT: 39.2 % — AB (ref 40.0–52.0)
Hemoglobin: 13.1 g/dL (ref 13.0–18.0)
MCH: 31.2 pg (ref 26.0–34.0)
MCHC: 33.4 g/dL (ref 32.0–36.0)
MCV: 93.6 fL (ref 80.0–100.0)
PLATELETS: 211 10*3/uL (ref 150–440)
RBC: 4.18 MIL/uL — AB (ref 4.40–5.90)
RDW: 13.6 % (ref 11.5–14.5)
WBC: 7.7 10*3/uL (ref 3.8–10.6)

## 2015-05-12 ENCOUNTER — Ambulatory Visit
Admission: RE | Admit: 2015-05-12 | Discharge: 2015-05-12 | Disposition: A | Discharge status: Home / Self Care | Payer: Medicare Other | Source: Ambulatory Visit | Attending: Radiation Oncology | Admitting: Radiation Oncology

## 2015-05-12 DIAGNOSIS — Z87891 Personal history of nicotine dependence: Secondary | ICD-10-CM | POA: Diagnosis not present

## 2015-05-12 DIAGNOSIS — I499 Cardiac arrhythmia, unspecified: Secondary | ICD-10-CM | POA: Diagnosis not present

## 2015-05-12 DIAGNOSIS — I10 Essential (primary) hypertension: Secondary | ICD-10-CM | POA: Diagnosis not present

## 2015-05-12 DIAGNOSIS — Z8049 Family history of malignant neoplasm of other genital organs: Secondary | ICD-10-CM | POA: Diagnosis not present

## 2015-05-12 DIAGNOSIS — C3411 Malignant neoplasm of upper lobe, right bronchus or lung: Secondary | ICD-10-CM | POA: Diagnosis not present

## 2015-05-12 DIAGNOSIS — Z510 Encounter for antineoplastic radiation therapy: Secondary | ICD-10-CM | POA: Diagnosis not present

## 2015-05-12 DIAGNOSIS — J449 Chronic obstructive pulmonary disease, unspecified: Secondary | ICD-10-CM | POA: Diagnosis not present

## 2015-05-12 DIAGNOSIS — Z8052 Family history of malignant neoplasm of bladder: Secondary | ICD-10-CM | POA: Diagnosis not present

## 2015-05-12 DIAGNOSIS — R918 Other nonspecific abnormal finding of lung field: Secondary | ICD-10-CM | POA: Diagnosis not present

## 2015-05-12 DIAGNOSIS — N400 Enlarged prostate without lower urinary tract symptoms: Secondary | ICD-10-CM | POA: Diagnosis not present

## 2015-05-13 ENCOUNTER — Ambulatory Visit
Admission: RE | Admit: 2015-05-13 | Discharge: 2015-05-13 | Disposition: A | Discharge status: Home / Self Care | Payer: Medicare Other | Source: Ambulatory Visit | Attending: Radiation Oncology | Admitting: Radiation Oncology

## 2015-05-13 DIAGNOSIS — Z510 Encounter for antineoplastic radiation therapy: Secondary | ICD-10-CM | POA: Diagnosis not present

## 2015-05-13 DIAGNOSIS — J449 Chronic obstructive pulmonary disease, unspecified: Secondary | ICD-10-CM | POA: Diagnosis not present

## 2015-05-13 DIAGNOSIS — R918 Other nonspecific abnormal finding of lung field: Secondary | ICD-10-CM | POA: Diagnosis not present

## 2015-05-13 DIAGNOSIS — N400 Enlarged prostate without lower urinary tract symptoms: Secondary | ICD-10-CM | POA: Diagnosis not present

## 2015-05-13 DIAGNOSIS — Z8049 Family history of malignant neoplasm of other genital organs: Secondary | ICD-10-CM | POA: Diagnosis not present

## 2015-05-13 DIAGNOSIS — C3411 Malignant neoplasm of upper lobe, right bronchus or lung: Secondary | ICD-10-CM | POA: Diagnosis not present

## 2015-05-13 DIAGNOSIS — Z8052 Family history of malignant neoplasm of bladder: Secondary | ICD-10-CM | POA: Diagnosis not present

## 2015-05-13 DIAGNOSIS — I10 Essential (primary) hypertension: Secondary | ICD-10-CM | POA: Diagnosis not present

## 2015-05-13 DIAGNOSIS — Z87891 Personal history of nicotine dependence: Secondary | ICD-10-CM | POA: Diagnosis not present

## 2015-05-13 DIAGNOSIS — I499 Cardiac arrhythmia, unspecified: Secondary | ICD-10-CM | POA: Diagnosis not present

## 2015-05-16 ENCOUNTER — Ambulatory Visit
Admission: RE | Admit: 2015-05-16 | Discharge: 2015-05-16 | Disposition: A | Discharge status: Home / Self Care | Payer: Medicare Other | Source: Ambulatory Visit | Attending: Radiation Oncology | Admitting: Radiation Oncology

## 2015-05-16 DIAGNOSIS — N400 Enlarged prostate without lower urinary tract symptoms: Secondary | ICD-10-CM | POA: Diagnosis not present

## 2015-05-16 DIAGNOSIS — Z510 Encounter for antineoplastic radiation therapy: Secondary | ICD-10-CM | POA: Diagnosis not present

## 2015-05-16 DIAGNOSIS — Z8052 Family history of malignant neoplasm of bladder: Secondary | ICD-10-CM | POA: Diagnosis not present

## 2015-05-16 DIAGNOSIS — I499 Cardiac arrhythmia, unspecified: Secondary | ICD-10-CM | POA: Diagnosis not present

## 2015-05-16 DIAGNOSIS — Z87891 Personal history of nicotine dependence: Secondary | ICD-10-CM | POA: Diagnosis not present

## 2015-05-16 DIAGNOSIS — J449 Chronic obstructive pulmonary disease, unspecified: Secondary | ICD-10-CM | POA: Diagnosis not present

## 2015-05-16 DIAGNOSIS — Z8049 Family history of malignant neoplasm of other genital organs: Secondary | ICD-10-CM | POA: Diagnosis not present

## 2015-05-16 DIAGNOSIS — I10 Essential (primary) hypertension: Secondary | ICD-10-CM | POA: Diagnosis not present

## 2015-05-16 DIAGNOSIS — C3411 Malignant neoplasm of upper lobe, right bronchus or lung: Secondary | ICD-10-CM | POA: Diagnosis not present

## 2015-05-16 DIAGNOSIS — R918 Other nonspecific abnormal finding of lung field: Secondary | ICD-10-CM | POA: Diagnosis not present

## 2015-05-17 ENCOUNTER — Ambulatory Visit
Admission: RE | Admit: 2015-05-17 | Discharge: 2015-05-17 | Disposition: A | Discharge status: Home / Self Care | Payer: Medicare Other | Source: Ambulatory Visit | Attending: Radiation Oncology | Admitting: Radiation Oncology

## 2015-05-17 DIAGNOSIS — N400 Enlarged prostate without lower urinary tract symptoms: Secondary | ICD-10-CM | POA: Diagnosis not present

## 2015-05-17 DIAGNOSIS — R918 Other nonspecific abnormal finding of lung field: Secondary | ICD-10-CM | POA: Diagnosis not present

## 2015-05-17 DIAGNOSIS — Z87891 Personal history of nicotine dependence: Secondary | ICD-10-CM | POA: Diagnosis not present

## 2015-05-17 DIAGNOSIS — I10 Essential (primary) hypertension: Secondary | ICD-10-CM | POA: Diagnosis not present

## 2015-05-17 DIAGNOSIS — Z8052 Family history of malignant neoplasm of bladder: Secondary | ICD-10-CM | POA: Diagnosis not present

## 2015-05-17 DIAGNOSIS — Z8049 Family history of malignant neoplasm of other genital organs: Secondary | ICD-10-CM | POA: Diagnosis not present

## 2015-05-17 DIAGNOSIS — I499 Cardiac arrhythmia, unspecified: Secondary | ICD-10-CM | POA: Diagnosis not present

## 2015-05-17 DIAGNOSIS — J449 Chronic obstructive pulmonary disease, unspecified: Secondary | ICD-10-CM | POA: Diagnosis not present

## 2015-05-17 DIAGNOSIS — C3411 Malignant neoplasm of upper lobe, right bronchus or lung: Secondary | ICD-10-CM | POA: Diagnosis not present

## 2015-05-17 DIAGNOSIS — Z510 Encounter for antineoplastic radiation therapy: Secondary | ICD-10-CM | POA: Diagnosis not present

## 2015-05-18 ENCOUNTER — Inpatient Hospital Stay: Payer: Medicare Other | Attending: Internal Medicine

## 2015-05-18 ENCOUNTER — Ambulatory Visit: Payer: Self-pay | Admitting: Urology

## 2015-05-18 ENCOUNTER — Ambulatory Visit
Admission: RE | Admit: 2015-05-18 | Discharge: 2015-05-18 | Disposition: A | Discharge status: Home / Self Care | Payer: Medicare Other | Source: Ambulatory Visit | Attending: Radiation Oncology | Admitting: Radiation Oncology

## 2015-05-18 DIAGNOSIS — Z87891 Personal history of nicotine dependence: Secondary | ICD-10-CM | POA: Diagnosis not present

## 2015-05-18 DIAGNOSIS — I499 Cardiac arrhythmia, unspecified: Secondary | ICD-10-CM | POA: Diagnosis not present

## 2015-05-18 DIAGNOSIS — R918 Other nonspecific abnormal finding of lung field: Secondary | ICD-10-CM | POA: Diagnosis not present

## 2015-05-18 DIAGNOSIS — Z8049 Family history of malignant neoplasm of other genital organs: Secondary | ICD-10-CM | POA: Diagnosis not present

## 2015-05-18 DIAGNOSIS — N400 Enlarged prostate without lower urinary tract symptoms: Secondary | ICD-10-CM | POA: Diagnosis not present

## 2015-05-18 DIAGNOSIS — C3411 Malignant neoplasm of upper lobe, right bronchus or lung: Secondary | ICD-10-CM | POA: Insufficient documentation

## 2015-05-18 DIAGNOSIS — I10 Essential (primary) hypertension: Secondary | ICD-10-CM | POA: Diagnosis not present

## 2015-05-18 DIAGNOSIS — Z510 Encounter for antineoplastic radiation therapy: Secondary | ICD-10-CM | POA: Diagnosis not present

## 2015-05-18 DIAGNOSIS — Z8052 Family history of malignant neoplasm of bladder: Secondary | ICD-10-CM | POA: Diagnosis not present

## 2015-05-18 DIAGNOSIS — J449 Chronic obstructive pulmonary disease, unspecified: Secondary | ICD-10-CM | POA: Diagnosis not present

## 2015-05-18 DIAGNOSIS — C3491 Malignant neoplasm of unspecified part of right bronchus or lung: Secondary | ICD-10-CM

## 2015-05-18 LAB — CBC
HCT: 39.7 % — ABNORMAL LOW (ref 40.0–52.0)
HEMOGLOBIN: 13.2 g/dL (ref 13.0–18.0)
MCH: 31.3 pg (ref 26.0–34.0)
MCHC: 33.3 g/dL (ref 32.0–36.0)
MCV: 93.8 fL (ref 80.0–100.0)
Platelets: 224 10*3/uL (ref 150–440)
RBC: 4.23 MIL/uL — ABNORMAL LOW (ref 4.40–5.90)
RDW: 14 % (ref 11.5–14.5)
WBC: 6.5 10*3/uL (ref 3.8–10.6)

## 2015-05-19 ENCOUNTER — Ambulatory Visit
Admission: RE | Admit: 2015-05-19 | Discharge: 2015-05-19 | Disposition: A | Discharge status: Home / Self Care | Payer: Medicare Other | Source: Ambulatory Visit | Attending: Radiation Oncology | Admitting: Radiation Oncology

## 2015-05-19 DIAGNOSIS — Z8052 Family history of malignant neoplasm of bladder: Secondary | ICD-10-CM | POA: Diagnosis not present

## 2015-05-19 DIAGNOSIS — Z87891 Personal history of nicotine dependence: Secondary | ICD-10-CM | POA: Diagnosis not present

## 2015-05-19 DIAGNOSIS — Z510 Encounter for antineoplastic radiation therapy: Secondary | ICD-10-CM | POA: Diagnosis not present

## 2015-05-19 DIAGNOSIS — J449 Chronic obstructive pulmonary disease, unspecified: Secondary | ICD-10-CM | POA: Diagnosis not present

## 2015-05-19 DIAGNOSIS — Z8049 Family history of malignant neoplasm of other genital organs: Secondary | ICD-10-CM | POA: Diagnosis not present

## 2015-05-19 DIAGNOSIS — R918 Other nonspecific abnormal finding of lung field: Secondary | ICD-10-CM | POA: Diagnosis not present

## 2015-05-19 DIAGNOSIS — I10 Essential (primary) hypertension: Secondary | ICD-10-CM | POA: Diagnosis not present

## 2015-05-19 DIAGNOSIS — N400 Enlarged prostate without lower urinary tract symptoms: Secondary | ICD-10-CM | POA: Diagnosis not present

## 2015-05-19 DIAGNOSIS — I499 Cardiac arrhythmia, unspecified: Secondary | ICD-10-CM | POA: Diagnosis not present

## 2015-05-19 DIAGNOSIS — C3411 Malignant neoplasm of upper lobe, right bronchus or lung: Secondary | ICD-10-CM | POA: Diagnosis not present

## 2015-05-20 ENCOUNTER — Ambulatory Visit
Admission: RE | Admit: 2015-05-20 | Discharge: 2015-05-20 | Disposition: A | Discharge status: Home / Self Care | Payer: Medicare Other | Source: Ambulatory Visit | Attending: Radiation Oncology | Admitting: Radiation Oncology

## 2015-05-20 ENCOUNTER — Ambulatory Visit (INDEPENDENT_AMBULATORY_CARE_PROVIDER_SITE_OTHER): Payer: Medicare Other | Admitting: Urology

## 2015-05-20 ENCOUNTER — Encounter: Payer: Self-pay | Admitting: Urology

## 2015-05-20 VITALS — BP 128/69 | HR 78 | Ht 69.0 in | Wt 214.5 lb

## 2015-05-20 DIAGNOSIS — I10 Essential (primary) hypertension: Secondary | ICD-10-CM | POA: Diagnosis not present

## 2015-05-20 DIAGNOSIS — Z87891 Personal history of nicotine dependence: Secondary | ICD-10-CM | POA: Diagnosis not present

## 2015-05-20 DIAGNOSIS — N400 Enlarged prostate without lower urinary tract symptoms: Secondary | ICD-10-CM | POA: Diagnosis not present

## 2015-05-20 DIAGNOSIS — J449 Chronic obstructive pulmonary disease, unspecified: Secondary | ICD-10-CM | POA: Diagnosis not present

## 2015-05-20 DIAGNOSIS — C676 Malignant neoplasm of ureteric orifice: Secondary | ICD-10-CM

## 2015-05-20 DIAGNOSIS — R918 Other nonspecific abnormal finding of lung field: Secondary | ICD-10-CM | POA: Diagnosis not present

## 2015-05-20 DIAGNOSIS — Z8049 Family history of malignant neoplasm of other genital organs: Secondary | ICD-10-CM | POA: Diagnosis not present

## 2015-05-20 DIAGNOSIS — Z8052 Family history of malignant neoplasm of bladder: Secondary | ICD-10-CM | POA: Diagnosis not present

## 2015-05-20 DIAGNOSIS — Z510 Encounter for antineoplastic radiation therapy: Secondary | ICD-10-CM | POA: Diagnosis not present

## 2015-05-20 DIAGNOSIS — I499 Cardiac arrhythmia, unspecified: Secondary | ICD-10-CM | POA: Diagnosis not present

## 2015-05-20 DIAGNOSIS — C3411 Malignant neoplasm of upper lobe, right bronchus or lung: Secondary | ICD-10-CM | POA: Diagnosis not present

## 2015-05-20 NOTE — Progress Notes (Signed)
05/20/2015 11:35 AM   Joseph Graham February 24, 1941 [id number]  Referring provider: [name], MD [address] [place], [place] [address]  Chief Complaint  Patient presents with  . Follow-up    pt had surgery 03/2015 TURBT    HPI: The patient is a 74 year old gentleman who has both lung and bladder lesions. He had a 2-3 cm exophytic lesion in the midline posterior bladder wall superior to the trigone during office cystoscopy at his last visit. At that time, there was more concern about his lung lesion, so we planned to have his lung lesion addressed first with the TURBT to follow lung surgery . He had a lung biopsy which showed poorly differentiated carcinoma of unclear origin. There is concern it may be metastasis from the bladder, so he was sent back now for resection of his bladder tumor as well as to get a tissue specimen to determine the next step in his treatment. He has since undergone TURBT. His pathology shows noninvasive high-grade urothelial carcinoma. Is unclear TA or T1 disease based on the pathology report. There was no muscle in the specimen. It is a different carcinoma morphologically from his lung cancer. It is not clear at this time whether here not receiving chemotherapy as part of his undifferentiated carcinoma of the lung treatment.  Interval History:    The patient has done well since being seen in our clinic. He is undergoing radiation currently for his lung cancer. Is scheduled to be finished mid January 2017. He is not scheduled to receive chemotherapy at this time. He does note occasional spraying of his stream from his meatal stenosis. He is not using his meatal dilator. He does not desire any further dilation this time due to pain.   PMH: Past Medical History  Diagnosis Date  . Colon polyps     next colonoscopy due around 2018  . HTN (hypertension)   . Alcohol abuse, in remission     remote  . Benign essential tremor     improved on metoprolol  and gabapentin  . Dyslipidemia     mild off meds  . Chronic low back pain     MRI 2004, multilevel DDD  . BPH (benign prostatic hypertrophy) 12/30/2012  . Personal history of tobacco use, presenting hazards to health 02/18/2015    quit 06/2011, restarted 2014  . Multiple pulmonary nodules 02/2015  . COPD (chronic obstructive pulmonary disease) ([place])   . GERD (gastroesophageal reflux disease)   . Arthritis     BACK AND NECK  . Shortness of breath dyspnea     OCC WITH EXERTION  . Complication of anesthesia     DID NOT GET COMPLETELY NUMB WITH TONSILLECTOMY  . Cardiac arrhythmia 03/2010    h/o a flutter and CM, s/p ablation, normal stress test  . Primary cancer of bladder ([place]) 2016    Dwight Burdo  . Primary lung cancer ([place]) 2016    Surgical History: Past Surgical History  Procedure Laterality Date  . Hemorroidectomy  1982  . A flutter ablation  03/2010  . Cataract extraction    . Carotid US  03/2010    WNL  . Colonoscopy  09/2011    polyps, rpt due 5 yrs, mild diverticulosis [name])  . Tonsillectomy  1964  . Electromagnetic navigation brochoscopy N/A 03/02/2015    Procedure: ELECTROMAGNETIC NAVIGATION BRONCHOSCOPY;  Surgeon: [name], MD;  Location: [facility] ORS;  Service: Cardiopulmonary;  Laterality: N/A;  . Transurethral resection of bladder tumor N/A  04/06/2015    Procedure: TRANSURETHRAL RESECTION OF BLADDER TUMOR (TURBT) right ureteral stent placement;  Surgeon: [name], MD;  Location: [facility] ORS;  Service: Urology;  Laterality: N/A;  . Cystoscopy w/ retrogrades Bilateral 04/06/2015    Procedure: CYSTOSCOPY WITH RETROGRADE PYELOGRAM;  Surgeon: [name], MD;  Location: [facility] ORS;  Service: Urology;  Laterality: Bilateral;  . Urinary stent removal  04/14/15    Home Medications:    Medication List       This list is accurate as of: 05/20/15 11:35 AM.  Always use your most recent med list.               aspirin EC 81 MG tablet  Take 81 mg by mouth  daily.     famotidine 10 MG tablet  Commonly known as:  PEPCID  Take 10 mg by mouth 2 (two) times daily.     gabapentin 100 MG capsule  Commonly known as:  NEURONTIN  TAKE 1 CAPSULE BY MOUTH TWICE A DAY AS NEEDED     lisinopril 10 MG tablet  Commonly known as:  PRINIVIL,ZESTRIL  Take 1 tablet (10 mg total) by mouth daily.     metoprolol tartrate 25 MG tablet  Commonly known as:  LOPRESSOR  Take 0.5 tablets (12.5 mg total) by mouth 2 (two) times daily.     naproxen sodium 220 MG tablet  Commonly known as:  ANAPROX  Take 220 mg by mouth 2 (two) times daily with a meal.     rosuvastatin 5 MG tablet  Commonly known as:  CRESTOR  Take 1 tablet (5 mg total) by mouth daily.        Allergies: No Known Allergies  Family History: Family History  Problem Relation Age of Onset  . Cervical cancer Mother 65  . Hypertension Father   . Stroke Brother 101  . Tremor Father     and several uncles/aunts (not parkinson's)  . Bladder Cancer Brother 22  . Prostate cancer Neg Hx   . Kidney cancer Neg Hx     Social History:  reports that he quit smoking about 2 months ago. His smoking use included Cigarettes. He has a 91.5 pack-year smoking history. He has never used smokeless tobacco. He reports that he does not drink alcohol or use illicit drugs.  ROS: UROLOGY Frequent Urination?: No Hard to postpone urination?: No Burning/pain with urination?: No Get up at night to urinate?: No Leakage of urine?: No Urine stream starts and stops?: No Trouble starting stream?: No Do you have to strain to urinate?: No Blood in urine?: No Urinary tract infection?: No Sexually transmitted disease?: No Injury to kidneys or bladder?: No Painful intercourse?: No Weak stream?: No Erection problems?: No Penile pain?: No  Gastrointestinal Nausea?: No Vomiting?: No Indigestion/heartburn?: Yes Diarrhea?: No Constipation?: No  Constitutional Fever: No Night sweats?: No Weight loss?:  No Fatigue?: No  Skin Skin rash/lesions?: No Itching?: No  Eyes Blurred vision?: No Double vision?: No  Ears/Nose/Throat Sore throat?: No Sinus problems?: No  Hematologic/Lymphatic Swollen glands?: No Easy bruising?: No  Cardiovascular Leg swelling?: No Chest pain?: No  Respiratory Cough?: No Shortness of breath?: No  Endocrine Excessive thirst?: No  Musculoskeletal Back pain?: No Joint pain?: No  Neurological Headaches?: No Dizziness?: No  Psychologic Depression?: No Anxiety?: No  Physical Exam: BP 128/69 mmHg  Pulse 78  Ht '5\' 9"'$  (1.753 m)  Wt 214 lb 8 oz (97.297 kg)  BMI 31.66 kg/m2  Constitutional:  Alert  and oriented, No acute distress. HEENT: [place] AT, moist mucus membranes.  Trachea midline, no masses. Cardiovascular: No clubbing, cyanosis, or edema. Respiratory: Normal respiratory effort, no increased work of breathing. GI: Abdomen is soft, nontender, nondistended, no abdominal masses GU: No CVA tenderness. Meatus patent but somewhat stenotic. Skin: No rashes, bruises or suspicious lesions. Lymph: No cervical or inguinal adenopathy. Neurologic: Grossly intact, no focal deficits, moving all 4 extremities. Psychiatric: Normal mood and affect.  Laboratory Data: Lab Results  Component Value Date   WBC 6.5 05/18/2015   HGB 13.2 05/18/2015   HCT 39.7* 05/18/2015   MCV 93.8 05/18/2015   PLT 224 05/18/2015    Lab Results  Component Value Date   CREATININE 0.93 03/22/2015    Lab Results  Component Value Date   PSA 0.60 01/24/2015   PSA 0.68 12/31/2013   PSA 0.67 12/30/2012    No results found for: TESTOSTERONE  Lab Results  Component Value Date   HGBA1C 6.3 01/24/2015    Urinalysis    Component Value Date/Time   GLUCOSEU Negative 04/14/2015 0822   BILIRUBINUR Negative 04/14/2015 0822   NITRITE Negative 04/14/2015 0822   LEUKOCYTESUR 1+* 04/14/2015 0822    Assessment & Plan:    The patient has non-invasive high-grade  urothelial carcinoma with no muscle in the specimen. I discussed with the patient that the standard of care would be to perform a reresection followed by BCG induction and maintenance therapy. However this is complicated by the fact that he is about to undergo treatment for undifferentiated lung carcinoma. He is scheduled to finish his radiation therapy in mid January 2017, he does not want to undergo anything that could potentially interfere with that therapy including surgery or BCG-induced sepsis. He'll be scheduled for repeat TURBT in late January after completing his radiation therapy. We'll perform meatal dilation at that time.  He'll be a candidate for intravesical BCG at that time. However, if he does need chemotherapy for his lung cancer we would need to consider intravesical mitomycin instead as intravesical BCG would be contraindicated.  1.  High grade noninvasive TCC of bladder  -Repeat TURBT after completing radiation therapy  2. Meatal Stenosis -Will perform dilation the time of his repeat TURBT  [name], [place] [address], [place], [place] [address] [phone]

## 2015-05-23 ENCOUNTER — Ambulatory Visit
Admission: RE | Admit: 2015-05-23 | Discharge: 2015-05-23 | Disposition: A | Discharge status: Home / Self Care | Payer: Medicare Other | Source: Ambulatory Visit | Attending: Radiation Oncology | Admitting: Radiation Oncology

## 2015-05-23 DIAGNOSIS — I499 Cardiac arrhythmia, unspecified: Secondary | ICD-10-CM | POA: Diagnosis not present

## 2015-05-23 DIAGNOSIS — C3411 Malignant neoplasm of upper lobe, right bronchus or lung: Secondary | ICD-10-CM | POA: Diagnosis not present

## 2015-05-23 DIAGNOSIS — Z8052 Family history of malignant neoplasm of bladder: Secondary | ICD-10-CM | POA: Diagnosis not present

## 2015-05-23 DIAGNOSIS — R918 Other nonspecific abnormal finding of lung field: Secondary | ICD-10-CM | POA: Diagnosis not present

## 2015-05-23 DIAGNOSIS — J449 Chronic obstructive pulmonary disease, unspecified: Secondary | ICD-10-CM | POA: Diagnosis not present

## 2015-05-23 DIAGNOSIS — Z8049 Family history of malignant neoplasm of other genital organs: Secondary | ICD-10-CM | POA: Diagnosis not present

## 2015-05-23 DIAGNOSIS — I10 Essential (primary) hypertension: Secondary | ICD-10-CM | POA: Diagnosis not present

## 2015-05-23 DIAGNOSIS — N400 Enlarged prostate without lower urinary tract symptoms: Secondary | ICD-10-CM | POA: Diagnosis not present

## 2015-05-23 DIAGNOSIS — Z87891 Personal history of nicotine dependence: Secondary | ICD-10-CM | POA: Diagnosis not present

## 2015-05-23 DIAGNOSIS — Z510 Encounter for antineoplastic radiation therapy: Secondary | ICD-10-CM | POA: Diagnosis not present

## 2015-05-24 ENCOUNTER — Ambulatory Visit
Admission: RE | Admit: 2015-05-24 | Discharge: 2015-05-24 | Disposition: A | Discharge status: Home / Self Care | Payer: Medicare Other | Source: Ambulatory Visit | Attending: Radiation Oncology | Admitting: Radiation Oncology

## 2015-05-24 DIAGNOSIS — I499 Cardiac arrhythmia, unspecified: Secondary | ICD-10-CM | POA: Diagnosis not present

## 2015-05-24 DIAGNOSIS — Z8052 Family history of malignant neoplasm of bladder: Secondary | ICD-10-CM | POA: Diagnosis not present

## 2015-05-24 DIAGNOSIS — R918 Other nonspecific abnormal finding of lung field: Secondary | ICD-10-CM | POA: Diagnosis not present

## 2015-05-24 DIAGNOSIS — J449 Chronic obstructive pulmonary disease, unspecified: Secondary | ICD-10-CM | POA: Diagnosis not present

## 2015-05-24 DIAGNOSIS — I10 Essential (primary) hypertension: Secondary | ICD-10-CM | POA: Diagnosis not present

## 2015-05-24 DIAGNOSIS — Z87891 Personal history of nicotine dependence: Secondary | ICD-10-CM | POA: Diagnosis not present

## 2015-05-24 DIAGNOSIS — C3411 Malignant neoplasm of upper lobe, right bronchus or lung: Secondary | ICD-10-CM | POA: Diagnosis not present

## 2015-05-24 DIAGNOSIS — Z510 Encounter for antineoplastic radiation therapy: Secondary | ICD-10-CM | POA: Diagnosis not present

## 2015-05-24 DIAGNOSIS — N400 Enlarged prostate without lower urinary tract symptoms: Secondary | ICD-10-CM | POA: Diagnosis not present

## 2015-05-24 DIAGNOSIS — Z8049 Family history of malignant neoplasm of other genital organs: Secondary | ICD-10-CM | POA: Diagnosis not present

## 2015-05-25 ENCOUNTER — Ambulatory Visit
Admission: RE | Admit: 2015-05-25 | Discharge: 2015-05-25 | Disposition: A | Discharge status: Home / Self Care | Payer: Medicare Other | Source: Ambulatory Visit | Attending: Radiation Oncology | Admitting: Radiation Oncology

## 2015-05-25 ENCOUNTER — Inpatient Hospital Stay: Payer: Medicare Other

## 2015-05-25 DIAGNOSIS — C3411 Malignant neoplasm of upper lobe, right bronchus or lung: Secondary | ICD-10-CM | POA: Diagnosis not present

## 2015-05-25 DIAGNOSIS — J449 Chronic obstructive pulmonary disease, unspecified: Secondary | ICD-10-CM | POA: Diagnosis not present

## 2015-05-25 DIAGNOSIS — Z510 Encounter for antineoplastic radiation therapy: Secondary | ICD-10-CM | POA: Diagnosis not present

## 2015-05-25 DIAGNOSIS — Z8052 Family history of malignant neoplasm of bladder: Secondary | ICD-10-CM | POA: Diagnosis not present

## 2015-05-25 DIAGNOSIS — N400 Enlarged prostate without lower urinary tract symptoms: Secondary | ICD-10-CM | POA: Diagnosis not present

## 2015-05-25 DIAGNOSIS — Z87891 Personal history of nicotine dependence: Secondary | ICD-10-CM | POA: Diagnosis not present

## 2015-05-25 DIAGNOSIS — Z8049 Family history of malignant neoplasm of other genital organs: Secondary | ICD-10-CM | POA: Diagnosis not present

## 2015-05-25 DIAGNOSIS — I499 Cardiac arrhythmia, unspecified: Secondary | ICD-10-CM | POA: Diagnosis not present

## 2015-05-25 DIAGNOSIS — C3491 Malignant neoplasm of unspecified part of right bronchus or lung: Secondary | ICD-10-CM

## 2015-05-25 DIAGNOSIS — R918 Other nonspecific abnormal finding of lung field: Secondary | ICD-10-CM | POA: Diagnosis not present

## 2015-05-25 DIAGNOSIS — I10 Essential (primary) hypertension: Secondary | ICD-10-CM | POA: Diagnosis not present

## 2015-05-25 LAB — CBC
HCT: 41.1 % (ref 40.0–52.0)
Hemoglobin: 13.7 g/dL (ref 13.0–18.0)
MCH: 31.1 pg (ref 26.0–34.0)
MCHC: 33.4 g/dL (ref 32.0–36.0)
MCV: 93.1 fL (ref 80.0–100.0)
PLATELETS: 218 10*3/uL (ref 150–440)
RBC: 4.41 MIL/uL (ref 4.40–5.90)
RDW: 13.6 % (ref 11.5–14.5)
WBC: 6.7 10*3/uL (ref 3.8–10.6)

## 2015-05-26 ENCOUNTER — Telehealth: Payer: Self-pay | Admitting: Radiology

## 2015-05-26 ENCOUNTER — Ambulatory Visit
Admission: RE | Admit: 2015-05-26 | Discharge: 2015-05-26 | Disposition: A | Discharge status: Home / Self Care | Payer: Medicare Other | Source: Ambulatory Visit | Attending: Radiation Oncology | Admitting: Radiation Oncology

## 2015-05-26 DIAGNOSIS — Z8052 Family history of malignant neoplasm of bladder: Secondary | ICD-10-CM | POA: Diagnosis not present

## 2015-05-26 DIAGNOSIS — R918 Other nonspecific abnormal finding of lung field: Secondary | ICD-10-CM | POA: Diagnosis not present

## 2015-05-26 DIAGNOSIS — J449 Chronic obstructive pulmonary disease, unspecified: Secondary | ICD-10-CM | POA: Diagnosis not present

## 2015-05-26 DIAGNOSIS — Z8049 Family history of malignant neoplasm of other genital organs: Secondary | ICD-10-CM | POA: Diagnosis not present

## 2015-05-26 DIAGNOSIS — C3411 Malignant neoplasm of upper lobe, right bronchus or lung: Secondary | ICD-10-CM | POA: Diagnosis not present

## 2015-05-26 DIAGNOSIS — Z87891 Personal history of nicotine dependence: Secondary | ICD-10-CM | POA: Diagnosis not present

## 2015-05-26 DIAGNOSIS — Z510 Encounter for antineoplastic radiation therapy: Secondary | ICD-10-CM | POA: Diagnosis not present

## 2015-05-26 DIAGNOSIS — N400 Enlarged prostate without lower urinary tract symptoms: Secondary | ICD-10-CM | POA: Diagnosis not present

## 2015-05-26 DIAGNOSIS — I499 Cardiac arrhythmia, unspecified: Secondary | ICD-10-CM | POA: Diagnosis not present

## 2015-05-26 DIAGNOSIS — I10 Essential (primary) hypertension: Secondary | ICD-10-CM | POA: Diagnosis not present

## 2015-05-26 NOTE — Telephone Encounter (Signed)
Notified pt of surgery scheduled 07/13/15, pre-admit appt on 06/30/15 and to call day prior to surgery for arrival time. Pt voices understanding.

## 2015-05-27 ENCOUNTER — Ambulatory Visit
Admission: RE | Admit: 2015-05-27 | Discharge: 2015-05-27 | Disposition: A | Discharge status: Home / Self Care | Payer: Medicare Other | Source: Ambulatory Visit | Attending: Radiation Oncology | Admitting: Radiation Oncology

## 2015-05-27 DIAGNOSIS — Z87891 Personal history of nicotine dependence: Secondary | ICD-10-CM | POA: Diagnosis not present

## 2015-05-27 DIAGNOSIS — I10 Essential (primary) hypertension: Secondary | ICD-10-CM | POA: Diagnosis not present

## 2015-05-27 DIAGNOSIS — C3411 Malignant neoplasm of upper lobe, right bronchus or lung: Secondary | ICD-10-CM | POA: Diagnosis not present

## 2015-05-27 DIAGNOSIS — Z8052 Family history of malignant neoplasm of bladder: Secondary | ICD-10-CM | POA: Diagnosis not present

## 2015-05-27 DIAGNOSIS — Z8049 Family history of malignant neoplasm of other genital organs: Secondary | ICD-10-CM | POA: Diagnosis not present

## 2015-05-27 DIAGNOSIS — I499 Cardiac arrhythmia, unspecified: Secondary | ICD-10-CM | POA: Diagnosis not present

## 2015-05-27 DIAGNOSIS — Z510 Encounter for antineoplastic radiation therapy: Secondary | ICD-10-CM | POA: Diagnosis not present

## 2015-05-27 DIAGNOSIS — J449 Chronic obstructive pulmonary disease, unspecified: Secondary | ICD-10-CM | POA: Diagnosis not present

## 2015-05-27 DIAGNOSIS — N400 Enlarged prostate without lower urinary tract symptoms: Secondary | ICD-10-CM | POA: Diagnosis not present

## 2015-05-27 DIAGNOSIS — R918 Other nonspecific abnormal finding of lung field: Secondary | ICD-10-CM | POA: Diagnosis not present

## 2015-05-30 ENCOUNTER — Ambulatory Visit
Admission: RE | Admit: 2015-05-30 | Discharge: 2015-05-30 | Disposition: A | Discharge status: Home / Self Care | Payer: Medicare Other | Source: Ambulatory Visit | Attending: Radiation Oncology | Admitting: Radiation Oncology

## 2015-05-30 DIAGNOSIS — I499 Cardiac arrhythmia, unspecified: Secondary | ICD-10-CM | POA: Diagnosis not present

## 2015-05-30 DIAGNOSIS — Z510 Encounter for antineoplastic radiation therapy: Secondary | ICD-10-CM | POA: Diagnosis not present

## 2015-05-30 DIAGNOSIS — J449 Chronic obstructive pulmonary disease, unspecified: Secondary | ICD-10-CM | POA: Diagnosis not present

## 2015-05-30 DIAGNOSIS — C3411 Malignant neoplasm of upper lobe, right bronchus or lung: Secondary | ICD-10-CM | POA: Diagnosis not present

## 2015-05-30 DIAGNOSIS — Z8052 Family history of malignant neoplasm of bladder: Secondary | ICD-10-CM | POA: Diagnosis not present

## 2015-05-30 DIAGNOSIS — I10 Essential (primary) hypertension: Secondary | ICD-10-CM | POA: Diagnosis not present

## 2015-05-30 DIAGNOSIS — R918 Other nonspecific abnormal finding of lung field: Secondary | ICD-10-CM | POA: Diagnosis not present

## 2015-05-30 DIAGNOSIS — Z87891 Personal history of nicotine dependence: Secondary | ICD-10-CM | POA: Diagnosis not present

## 2015-05-30 DIAGNOSIS — N400 Enlarged prostate without lower urinary tract symptoms: Secondary | ICD-10-CM | POA: Diagnosis not present

## 2015-05-30 DIAGNOSIS — Z8049 Family history of malignant neoplasm of other genital organs: Secondary | ICD-10-CM | POA: Diagnosis not present

## 2015-05-31 ENCOUNTER — Ambulatory Visit
Admission: RE | Admit: 2015-05-31 | Discharge: 2015-05-31 | Disposition: A | Discharge status: Home / Self Care | Payer: Medicare Other | Source: Ambulatory Visit | Attending: Radiation Oncology | Admitting: Radiation Oncology

## 2015-05-31 DIAGNOSIS — Z8052 Family history of malignant neoplasm of bladder: Secondary | ICD-10-CM | POA: Diagnosis not present

## 2015-05-31 DIAGNOSIS — Z510 Encounter for antineoplastic radiation therapy: Secondary | ICD-10-CM | POA: Diagnosis not present

## 2015-05-31 DIAGNOSIS — Z8049 Family history of malignant neoplasm of other genital organs: Secondary | ICD-10-CM | POA: Diagnosis not present

## 2015-05-31 DIAGNOSIS — Z87891 Personal history of nicotine dependence: Secondary | ICD-10-CM | POA: Diagnosis not present

## 2015-05-31 DIAGNOSIS — I10 Essential (primary) hypertension: Secondary | ICD-10-CM | POA: Diagnosis not present

## 2015-05-31 DIAGNOSIS — I499 Cardiac arrhythmia, unspecified: Secondary | ICD-10-CM | POA: Diagnosis not present

## 2015-05-31 DIAGNOSIS — N400 Enlarged prostate without lower urinary tract symptoms: Secondary | ICD-10-CM | POA: Diagnosis not present

## 2015-05-31 DIAGNOSIS — R918 Other nonspecific abnormal finding of lung field: Secondary | ICD-10-CM | POA: Diagnosis not present

## 2015-05-31 DIAGNOSIS — C3411 Malignant neoplasm of upper lobe, right bronchus or lung: Secondary | ICD-10-CM | POA: Diagnosis not present

## 2015-05-31 DIAGNOSIS — J449 Chronic obstructive pulmonary disease, unspecified: Secondary | ICD-10-CM | POA: Diagnosis not present

## 2015-06-01 ENCOUNTER — Inpatient Hospital Stay: Payer: Medicare Other

## 2015-06-01 ENCOUNTER — Ambulatory Visit
Admission: RE | Admit: 2015-06-01 | Discharge: 2015-06-01 | Disposition: A | Discharge status: Home / Self Care | Payer: Medicare Other | Source: Ambulatory Visit | Attending: Radiation Oncology | Admitting: Radiation Oncology

## 2015-06-01 DIAGNOSIS — I10 Essential (primary) hypertension: Secondary | ICD-10-CM | POA: Diagnosis not present

## 2015-06-01 DIAGNOSIS — Z87891 Personal history of nicotine dependence: Secondary | ICD-10-CM | POA: Diagnosis not present

## 2015-06-01 DIAGNOSIS — J449 Chronic obstructive pulmonary disease, unspecified: Secondary | ICD-10-CM | POA: Diagnosis not present

## 2015-06-01 DIAGNOSIS — C3491 Malignant neoplasm of unspecified part of right bronchus or lung: Secondary | ICD-10-CM

## 2015-06-01 DIAGNOSIS — Z510 Encounter for antineoplastic radiation therapy: Secondary | ICD-10-CM | POA: Diagnosis not present

## 2015-06-01 DIAGNOSIS — Z8049 Family history of malignant neoplasm of other genital organs: Secondary | ICD-10-CM | POA: Diagnosis not present

## 2015-06-01 DIAGNOSIS — C3411 Malignant neoplasm of upper lobe, right bronchus or lung: Secondary | ICD-10-CM | POA: Diagnosis not present

## 2015-06-01 DIAGNOSIS — N400 Enlarged prostate without lower urinary tract symptoms: Secondary | ICD-10-CM | POA: Diagnosis not present

## 2015-06-01 DIAGNOSIS — I499 Cardiac arrhythmia, unspecified: Secondary | ICD-10-CM | POA: Diagnosis not present

## 2015-06-01 DIAGNOSIS — R918 Other nonspecific abnormal finding of lung field: Secondary | ICD-10-CM | POA: Diagnosis not present

## 2015-06-01 DIAGNOSIS — Z8052 Family history of malignant neoplasm of bladder: Secondary | ICD-10-CM | POA: Diagnosis not present

## 2015-06-01 LAB — CBC
HCT: 40.7 % (ref 40.0–52.0)
HEMOGLOBIN: 13.6 g/dL (ref 13.0–18.0)
MCH: 31.3 pg (ref 26.0–34.0)
MCHC: 33.5 g/dL (ref 32.0–36.0)
MCV: 93.5 fL (ref 80.0–100.0)
PLATELETS: 198 10*3/uL (ref 150–440)
RBC: 4.36 MIL/uL — AB (ref 4.40–5.90)
RDW: 13.8 % (ref 11.5–14.5)
WBC: 6.6 10*3/uL (ref 3.8–10.6)

## 2015-06-02 ENCOUNTER — Ambulatory Visit
Admission: RE | Admit: 2015-06-02 | Discharge: 2015-06-02 | Disposition: A | Discharge status: Home / Self Care | Payer: Medicare Other | Source: Ambulatory Visit | Attending: Radiation Oncology | Admitting: Radiation Oncology

## 2015-06-02 DIAGNOSIS — N400 Enlarged prostate without lower urinary tract symptoms: Secondary | ICD-10-CM | POA: Diagnosis not present

## 2015-06-02 DIAGNOSIS — C3411 Malignant neoplasm of upper lobe, right bronchus or lung: Secondary | ICD-10-CM | POA: Diagnosis not present

## 2015-06-02 DIAGNOSIS — Z510 Encounter for antineoplastic radiation therapy: Secondary | ICD-10-CM | POA: Diagnosis not present

## 2015-06-02 DIAGNOSIS — Z8049 Family history of malignant neoplasm of other genital organs: Secondary | ICD-10-CM | POA: Diagnosis not present

## 2015-06-02 DIAGNOSIS — R918 Other nonspecific abnormal finding of lung field: Secondary | ICD-10-CM | POA: Diagnosis not present

## 2015-06-02 DIAGNOSIS — I499 Cardiac arrhythmia, unspecified: Secondary | ICD-10-CM | POA: Diagnosis not present

## 2015-06-02 DIAGNOSIS — I10 Essential (primary) hypertension: Secondary | ICD-10-CM | POA: Diagnosis not present

## 2015-06-02 DIAGNOSIS — Z8052 Family history of malignant neoplasm of bladder: Secondary | ICD-10-CM | POA: Diagnosis not present

## 2015-06-02 DIAGNOSIS — J449 Chronic obstructive pulmonary disease, unspecified: Secondary | ICD-10-CM | POA: Diagnosis not present

## 2015-06-02 DIAGNOSIS — Z87891 Personal history of nicotine dependence: Secondary | ICD-10-CM | POA: Diagnosis not present

## 2015-06-03 ENCOUNTER — Ambulatory Visit
Admission: RE | Admit: 2015-06-03 | Discharge: 2015-06-03 | Disposition: A | Discharge status: Home / Self Care | Payer: Medicare Other | Source: Ambulatory Visit | Attending: Radiation Oncology | Admitting: Radiation Oncology

## 2015-06-03 DIAGNOSIS — N400 Enlarged prostate without lower urinary tract symptoms: Secondary | ICD-10-CM | POA: Diagnosis not present

## 2015-06-03 DIAGNOSIS — I499 Cardiac arrhythmia, unspecified: Secondary | ICD-10-CM | POA: Diagnosis not present

## 2015-06-03 DIAGNOSIS — R918 Other nonspecific abnormal finding of lung field: Secondary | ICD-10-CM | POA: Diagnosis not present

## 2015-06-03 DIAGNOSIS — Z8052 Family history of malignant neoplasm of bladder: Secondary | ICD-10-CM | POA: Diagnosis not present

## 2015-06-03 DIAGNOSIS — J449 Chronic obstructive pulmonary disease, unspecified: Secondary | ICD-10-CM | POA: Diagnosis not present

## 2015-06-03 DIAGNOSIS — Z510 Encounter for antineoplastic radiation therapy: Secondary | ICD-10-CM | POA: Diagnosis not present

## 2015-06-03 DIAGNOSIS — C3411 Malignant neoplasm of upper lobe, right bronchus or lung: Secondary | ICD-10-CM | POA: Diagnosis not present

## 2015-06-03 DIAGNOSIS — I10 Essential (primary) hypertension: Secondary | ICD-10-CM | POA: Diagnosis not present

## 2015-06-03 DIAGNOSIS — Z8049 Family history of malignant neoplasm of other genital organs: Secondary | ICD-10-CM | POA: Diagnosis not present

## 2015-06-03 DIAGNOSIS — Z87891 Personal history of nicotine dependence: Secondary | ICD-10-CM | POA: Diagnosis not present

## 2015-06-07 ENCOUNTER — Ambulatory Visit
Admission: RE | Admit: 2015-06-07 | Discharge: 2015-06-07 | Disposition: A | Discharge status: Home / Self Care | Payer: Medicare Other | Source: Ambulatory Visit | Attending: Radiation Oncology | Admitting: Radiation Oncology

## 2015-06-07 ENCOUNTER — Other Ambulatory Visit: Payer: Self-pay | Admitting: *Deleted

## 2015-06-07 DIAGNOSIS — Z8049 Family history of malignant neoplasm of other genital organs: Secondary | ICD-10-CM | POA: Diagnosis not present

## 2015-06-07 DIAGNOSIS — Z8052 Family history of malignant neoplasm of bladder: Secondary | ICD-10-CM | POA: Diagnosis not present

## 2015-06-07 DIAGNOSIS — I10 Essential (primary) hypertension: Secondary | ICD-10-CM | POA: Diagnosis not present

## 2015-06-07 DIAGNOSIS — C3411 Malignant neoplasm of upper lobe, right bronchus or lung: Secondary | ICD-10-CM | POA: Diagnosis not present

## 2015-06-07 DIAGNOSIS — N400 Enlarged prostate without lower urinary tract symptoms: Secondary | ICD-10-CM | POA: Diagnosis not present

## 2015-06-07 DIAGNOSIS — Z510 Encounter for antineoplastic radiation therapy: Secondary | ICD-10-CM | POA: Diagnosis not present

## 2015-06-07 DIAGNOSIS — I499 Cardiac arrhythmia, unspecified: Secondary | ICD-10-CM | POA: Diagnosis not present

## 2015-06-07 DIAGNOSIS — Z87891 Personal history of nicotine dependence: Secondary | ICD-10-CM | POA: Diagnosis not present

## 2015-06-07 DIAGNOSIS — R918 Other nonspecific abnormal finding of lung field: Secondary | ICD-10-CM | POA: Diagnosis not present

## 2015-06-07 DIAGNOSIS — J449 Chronic obstructive pulmonary disease, unspecified: Secondary | ICD-10-CM | POA: Diagnosis not present

## 2015-06-08 ENCOUNTER — Other Ambulatory Visit: Payer: Self-pay | Admitting: *Deleted

## 2015-06-08 ENCOUNTER — Ambulatory Visit
Admission: RE | Admit: 2015-06-08 | Discharge: 2015-06-08 | Disposition: A | Discharge status: Home / Self Care | Payer: Medicare Other | Source: Ambulatory Visit | Attending: Radiation Oncology | Admitting: Radiation Oncology

## 2015-06-08 ENCOUNTER — Inpatient Hospital Stay: Payer: Medicare Other

## 2015-06-08 DIAGNOSIS — R918 Other nonspecific abnormal finding of lung field: Secondary | ICD-10-CM | POA: Diagnosis not present

## 2015-06-08 DIAGNOSIS — N400 Enlarged prostate without lower urinary tract symptoms: Secondary | ICD-10-CM | POA: Diagnosis not present

## 2015-06-08 DIAGNOSIS — C3491 Malignant neoplasm of unspecified part of right bronchus or lung: Secondary | ICD-10-CM

## 2015-06-08 DIAGNOSIS — Z87891 Personal history of nicotine dependence: Secondary | ICD-10-CM | POA: Diagnosis not present

## 2015-06-08 DIAGNOSIS — Z8052 Family history of malignant neoplasm of bladder: Secondary | ICD-10-CM | POA: Diagnosis not present

## 2015-06-08 DIAGNOSIS — J449 Chronic obstructive pulmonary disease, unspecified: Secondary | ICD-10-CM | POA: Diagnosis not present

## 2015-06-08 DIAGNOSIS — C3411 Malignant neoplasm of upper lobe, right bronchus or lung: Secondary | ICD-10-CM

## 2015-06-08 DIAGNOSIS — Z8049 Family history of malignant neoplasm of other genital organs: Secondary | ICD-10-CM | POA: Diagnosis not present

## 2015-06-08 DIAGNOSIS — I10 Essential (primary) hypertension: Secondary | ICD-10-CM | POA: Diagnosis not present

## 2015-06-08 DIAGNOSIS — Z510 Encounter for antineoplastic radiation therapy: Secondary | ICD-10-CM | POA: Diagnosis not present

## 2015-06-08 DIAGNOSIS — I499 Cardiac arrhythmia, unspecified: Secondary | ICD-10-CM | POA: Diagnosis not present

## 2015-06-08 LAB — CBC
HEMATOCRIT: 40.9 % (ref 40.0–52.0)
Hemoglobin: 13.6 g/dL (ref 13.0–18.0)
MCH: 31.1 pg (ref 26.0–34.0)
MCHC: 33.2 g/dL (ref 32.0–36.0)
MCV: 93.8 fL (ref 80.0–100.0)
PLATELETS: 217 10*3/uL (ref 150–440)
RBC: 4.36 MIL/uL — ABNORMAL LOW (ref 4.40–5.90)
RDW: 14 % (ref 11.5–14.5)
WBC: 6.2 10*3/uL (ref 3.8–10.6)

## 2015-06-09 ENCOUNTER — Ambulatory Visit
Admission: RE | Admit: 2015-06-09 | Discharge: 2015-06-09 | Disposition: A | Discharge status: Home / Self Care | Payer: Medicare Other | Source: Ambulatory Visit | Attending: Radiation Oncology | Admitting: Radiation Oncology

## 2015-06-09 DIAGNOSIS — I499 Cardiac arrhythmia, unspecified: Secondary | ICD-10-CM | POA: Diagnosis not present

## 2015-06-09 DIAGNOSIS — Z8049 Family history of malignant neoplasm of other genital organs: Secondary | ICD-10-CM | POA: Diagnosis not present

## 2015-06-09 DIAGNOSIS — C3411 Malignant neoplasm of upper lobe, right bronchus or lung: Secondary | ICD-10-CM | POA: Diagnosis not present

## 2015-06-09 DIAGNOSIS — I10 Essential (primary) hypertension: Secondary | ICD-10-CM | POA: Diagnosis not present

## 2015-06-09 DIAGNOSIS — Z510 Encounter for antineoplastic radiation therapy: Secondary | ICD-10-CM | POA: Diagnosis not present

## 2015-06-09 DIAGNOSIS — J449 Chronic obstructive pulmonary disease, unspecified: Secondary | ICD-10-CM | POA: Diagnosis not present

## 2015-06-09 DIAGNOSIS — R918 Other nonspecific abnormal finding of lung field: Secondary | ICD-10-CM | POA: Diagnosis not present

## 2015-06-09 DIAGNOSIS — Z8052 Family history of malignant neoplasm of bladder: Secondary | ICD-10-CM | POA: Diagnosis not present

## 2015-06-09 DIAGNOSIS — N400 Enlarged prostate without lower urinary tract symptoms: Secondary | ICD-10-CM | POA: Diagnosis not present

## 2015-06-09 DIAGNOSIS — Z87891 Personal history of nicotine dependence: Secondary | ICD-10-CM | POA: Diagnosis not present

## 2015-06-10 ENCOUNTER — Ambulatory Visit
Admission: RE | Admit: 2015-06-10 | Discharge: 2015-06-10 | Disposition: A | Discharge status: Home / Self Care | Payer: Medicare Other | Source: Ambulatory Visit | Attending: Radiation Oncology | Admitting: Radiation Oncology

## 2015-06-10 DIAGNOSIS — I10 Essential (primary) hypertension: Secondary | ICD-10-CM | POA: Diagnosis not present

## 2015-06-10 DIAGNOSIS — N400 Enlarged prostate without lower urinary tract symptoms: Secondary | ICD-10-CM | POA: Diagnosis not present

## 2015-06-10 DIAGNOSIS — J449 Chronic obstructive pulmonary disease, unspecified: Secondary | ICD-10-CM | POA: Diagnosis not present

## 2015-06-10 DIAGNOSIS — Z510 Encounter for antineoplastic radiation therapy: Secondary | ICD-10-CM | POA: Diagnosis not present

## 2015-06-10 DIAGNOSIS — R918 Other nonspecific abnormal finding of lung field: Secondary | ICD-10-CM | POA: Diagnosis not present

## 2015-06-10 DIAGNOSIS — Z8052 Family history of malignant neoplasm of bladder: Secondary | ICD-10-CM | POA: Diagnosis not present

## 2015-06-10 DIAGNOSIS — Z8049 Family history of malignant neoplasm of other genital organs: Secondary | ICD-10-CM | POA: Diagnosis not present

## 2015-06-10 DIAGNOSIS — I499 Cardiac arrhythmia, unspecified: Secondary | ICD-10-CM | POA: Diagnosis not present

## 2015-06-10 DIAGNOSIS — Z87891 Personal history of nicotine dependence: Secondary | ICD-10-CM | POA: Diagnosis not present

## 2015-06-10 DIAGNOSIS — C3411 Malignant neoplasm of upper lobe, right bronchus or lung: Secondary | ICD-10-CM | POA: Diagnosis not present

## 2015-06-13 ENCOUNTER — Ambulatory Visit: Payer: Medicare Other

## 2015-06-14 ENCOUNTER — Ambulatory Visit
Admission: RE | Admit: 2015-06-14 | Discharge: 2015-06-14 | Disposition: A | Discharge status: Home / Self Care | Payer: Medicare Other | Source: Ambulatory Visit | Attending: Radiation Oncology | Admitting: Radiation Oncology

## 2015-06-14 ENCOUNTER — Other Ambulatory Visit: Payer: Medicare Other | Admitting: *Deleted

## 2015-06-14 DIAGNOSIS — N400 Enlarged prostate without lower urinary tract symptoms: Secondary | ICD-10-CM | POA: Diagnosis not present

## 2015-06-14 DIAGNOSIS — I10 Essential (primary) hypertension: Secondary | ICD-10-CM | POA: Diagnosis not present

## 2015-06-14 DIAGNOSIS — C3411 Malignant neoplasm of upper lobe, right bronchus or lung: Secondary | ICD-10-CM

## 2015-06-14 DIAGNOSIS — Z87891 Personal history of nicotine dependence: Secondary | ICD-10-CM | POA: Diagnosis not present

## 2015-06-14 DIAGNOSIS — Z8052 Family history of malignant neoplasm of bladder: Secondary | ICD-10-CM | POA: Diagnosis not present

## 2015-06-14 DIAGNOSIS — J449 Chronic obstructive pulmonary disease, unspecified: Secondary | ICD-10-CM | POA: Diagnosis not present

## 2015-06-14 DIAGNOSIS — R918 Other nonspecific abnormal finding of lung field: Secondary | ICD-10-CM | POA: Diagnosis not present

## 2015-06-14 DIAGNOSIS — Z8049 Family history of malignant neoplasm of other genital organs: Secondary | ICD-10-CM | POA: Diagnosis not present

## 2015-06-14 DIAGNOSIS — I499 Cardiac arrhythmia, unspecified: Secondary | ICD-10-CM | POA: Diagnosis not present

## 2015-06-14 DIAGNOSIS — Z510 Encounter for antineoplastic radiation therapy: Secondary | ICD-10-CM | POA: Diagnosis not present

## 2015-06-15 ENCOUNTER — Inpatient Hospital Stay: Payer: Medicare Other | Attending: Internal Medicine

## 2015-06-15 ENCOUNTER — Ambulatory Visit
Admission: RE | Admit: 2015-06-15 | Discharge: 2015-06-15 | Disposition: A | Discharge status: Home / Self Care | Payer: Medicare Other | Source: Ambulatory Visit | Attending: Radiation Oncology | Admitting: Radiation Oncology

## 2015-06-15 DIAGNOSIS — J449 Chronic obstructive pulmonary disease, unspecified: Secondary | ICD-10-CM | POA: Diagnosis not present

## 2015-06-15 DIAGNOSIS — R918 Other nonspecific abnormal finding of lung field: Secondary | ICD-10-CM | POA: Diagnosis not present

## 2015-06-15 DIAGNOSIS — Z8049 Family history of malignant neoplasm of other genital organs: Secondary | ICD-10-CM | POA: Diagnosis not present

## 2015-06-15 DIAGNOSIS — I499 Cardiac arrhythmia, unspecified: Secondary | ICD-10-CM | POA: Diagnosis not present

## 2015-06-15 DIAGNOSIS — C3411 Malignant neoplasm of upper lobe, right bronchus or lung: Secondary | ICD-10-CM | POA: Diagnosis not present

## 2015-06-15 DIAGNOSIS — C3491 Malignant neoplasm of unspecified part of right bronchus or lung: Secondary | ICD-10-CM

## 2015-06-15 DIAGNOSIS — Z87891 Personal history of nicotine dependence: Secondary | ICD-10-CM | POA: Diagnosis not present

## 2015-06-15 DIAGNOSIS — N400 Enlarged prostate without lower urinary tract symptoms: Secondary | ICD-10-CM | POA: Diagnosis not present

## 2015-06-15 DIAGNOSIS — I10 Essential (primary) hypertension: Secondary | ICD-10-CM | POA: Diagnosis not present

## 2015-06-15 DIAGNOSIS — Z8052 Family history of malignant neoplasm of bladder: Secondary | ICD-10-CM | POA: Diagnosis not present

## 2015-06-15 DIAGNOSIS — Z510 Encounter for antineoplastic radiation therapy: Secondary | ICD-10-CM | POA: Diagnosis not present

## 2015-06-15 LAB — CBC
HCT: 41.5 % (ref 40.0–52.0)
Hemoglobin: 13.8 g/dL (ref 13.0–18.0)
MCH: 31.3 pg (ref 26.0–34.0)
MCHC: 33.2 g/dL (ref 32.0–36.0)
MCV: 94.3 fL (ref 80.0–100.0)
PLATELETS: 239 10*3/uL (ref 150–440)
RBC: 4.4 MIL/uL (ref 4.40–5.90)
RDW: 14 % (ref 11.5–14.5)
WBC: 6.5 10*3/uL (ref 3.8–10.6)

## 2015-06-16 ENCOUNTER — Ambulatory Visit
Admission: RE | Admit: 2015-06-16 | Discharge: 2015-06-16 | Disposition: A | Discharge status: Home / Self Care | Payer: Medicare Other | Source: Ambulatory Visit | Attending: Radiation Oncology | Admitting: Radiation Oncology

## 2015-06-16 DIAGNOSIS — I10 Essential (primary) hypertension: Secondary | ICD-10-CM | POA: Diagnosis not present

## 2015-06-16 DIAGNOSIS — R918 Other nonspecific abnormal finding of lung field: Secondary | ICD-10-CM | POA: Diagnosis not present

## 2015-06-16 DIAGNOSIS — Z87891 Personal history of nicotine dependence: Secondary | ICD-10-CM | POA: Diagnosis not present

## 2015-06-16 DIAGNOSIS — N400 Enlarged prostate without lower urinary tract symptoms: Secondary | ICD-10-CM | POA: Diagnosis not present

## 2015-06-16 DIAGNOSIS — I499 Cardiac arrhythmia, unspecified: Secondary | ICD-10-CM | POA: Diagnosis not present

## 2015-06-16 DIAGNOSIS — Z8052 Family history of malignant neoplasm of bladder: Secondary | ICD-10-CM | POA: Diagnosis not present

## 2015-06-16 DIAGNOSIS — C3411 Malignant neoplasm of upper lobe, right bronchus or lung: Secondary | ICD-10-CM | POA: Diagnosis not present

## 2015-06-16 DIAGNOSIS — J449 Chronic obstructive pulmonary disease, unspecified: Secondary | ICD-10-CM | POA: Diagnosis not present

## 2015-06-16 DIAGNOSIS — Z510 Encounter for antineoplastic radiation therapy: Secondary | ICD-10-CM | POA: Diagnosis not present

## 2015-06-16 DIAGNOSIS — Z8049 Family history of malignant neoplasm of other genital organs: Secondary | ICD-10-CM | POA: Diagnosis not present

## 2015-06-17 ENCOUNTER — Ambulatory Visit
Admission: RE | Admit: 2015-06-17 | Discharge: 2015-06-17 | Disposition: A | Discharge status: Home / Self Care | Payer: Medicare Other | Source: Ambulatory Visit | Attending: Radiation Oncology | Admitting: Radiation Oncology

## 2015-06-17 DIAGNOSIS — Z8052 Family history of malignant neoplasm of bladder: Secondary | ICD-10-CM | POA: Diagnosis not present

## 2015-06-17 DIAGNOSIS — Z87891 Personal history of nicotine dependence: Secondary | ICD-10-CM | POA: Diagnosis not present

## 2015-06-17 DIAGNOSIS — J449 Chronic obstructive pulmonary disease, unspecified: Secondary | ICD-10-CM | POA: Diagnosis not present

## 2015-06-17 DIAGNOSIS — R918 Other nonspecific abnormal finding of lung field: Secondary | ICD-10-CM | POA: Diagnosis not present

## 2015-06-17 DIAGNOSIS — N400 Enlarged prostate without lower urinary tract symptoms: Secondary | ICD-10-CM | POA: Diagnosis not present

## 2015-06-17 DIAGNOSIS — I10 Essential (primary) hypertension: Secondary | ICD-10-CM | POA: Diagnosis not present

## 2015-06-17 DIAGNOSIS — C3411 Malignant neoplasm of upper lobe, right bronchus or lung: Secondary | ICD-10-CM | POA: Diagnosis not present

## 2015-06-17 DIAGNOSIS — I499 Cardiac arrhythmia, unspecified: Secondary | ICD-10-CM | POA: Diagnosis not present

## 2015-06-17 DIAGNOSIS — Z8049 Family history of malignant neoplasm of other genital organs: Secondary | ICD-10-CM | POA: Diagnosis not present

## 2015-06-17 DIAGNOSIS — Z510 Encounter for antineoplastic radiation therapy: Secondary | ICD-10-CM | POA: Diagnosis not present

## 2015-06-20 ENCOUNTER — Ambulatory Visit: Payer: Medicare Other

## 2015-06-21 ENCOUNTER — Ambulatory Visit
Admission: RE | Admit: 2015-06-21 | Discharge: 2015-06-21 | Disposition: A | Discharge status: Home / Self Care | Payer: Medicare Other | Source: Ambulatory Visit | Attending: Radiation Oncology | Admitting: Radiation Oncology

## 2015-06-21 DIAGNOSIS — Z87891 Personal history of nicotine dependence: Secondary | ICD-10-CM | POA: Diagnosis not present

## 2015-06-21 DIAGNOSIS — I499 Cardiac arrhythmia, unspecified: Secondary | ICD-10-CM | POA: Diagnosis not present

## 2015-06-21 DIAGNOSIS — Z510 Encounter for antineoplastic radiation therapy: Secondary | ICD-10-CM | POA: Diagnosis not present

## 2015-06-21 DIAGNOSIS — N400 Enlarged prostate without lower urinary tract symptoms: Secondary | ICD-10-CM | POA: Diagnosis not present

## 2015-06-21 DIAGNOSIS — R918 Other nonspecific abnormal finding of lung field: Secondary | ICD-10-CM | POA: Diagnosis not present

## 2015-06-21 DIAGNOSIS — C3411 Malignant neoplasm of upper lobe, right bronchus or lung: Secondary | ICD-10-CM | POA: Diagnosis not present

## 2015-06-21 DIAGNOSIS — Z8052 Family history of malignant neoplasm of bladder: Secondary | ICD-10-CM | POA: Diagnosis not present

## 2015-06-21 DIAGNOSIS — J449 Chronic obstructive pulmonary disease, unspecified: Secondary | ICD-10-CM | POA: Diagnosis not present

## 2015-06-21 DIAGNOSIS — I10 Essential (primary) hypertension: Secondary | ICD-10-CM | POA: Diagnosis not present

## 2015-06-21 DIAGNOSIS — Z8049 Family history of malignant neoplasm of other genital organs: Secondary | ICD-10-CM | POA: Diagnosis not present

## 2015-06-22 ENCOUNTER — Ambulatory Visit: Payer: Medicare Other

## 2015-06-22 ENCOUNTER — Ambulatory Visit
Admission: RE | Admit: 2015-06-22 | Discharge: 2015-06-22 | Disposition: A | Discharge status: Home / Self Care | Payer: Medicare Other | Source: Ambulatory Visit | Attending: Radiation Oncology | Admitting: Radiation Oncology

## 2015-06-22 DIAGNOSIS — Z87891 Personal history of nicotine dependence: Secondary | ICD-10-CM | POA: Diagnosis not present

## 2015-06-22 DIAGNOSIS — Z510 Encounter for antineoplastic radiation therapy: Secondary | ICD-10-CM | POA: Diagnosis not present

## 2015-06-22 DIAGNOSIS — I499 Cardiac arrhythmia, unspecified: Secondary | ICD-10-CM | POA: Diagnosis not present

## 2015-06-22 DIAGNOSIS — I10 Essential (primary) hypertension: Secondary | ICD-10-CM | POA: Diagnosis not present

## 2015-06-22 DIAGNOSIS — Z8049 Family history of malignant neoplasm of other genital organs: Secondary | ICD-10-CM | POA: Diagnosis not present

## 2015-06-22 DIAGNOSIS — C3411 Malignant neoplasm of upper lobe, right bronchus or lung: Secondary | ICD-10-CM | POA: Diagnosis not present

## 2015-06-22 DIAGNOSIS — N400 Enlarged prostate without lower urinary tract symptoms: Secondary | ICD-10-CM | POA: Diagnosis not present

## 2015-06-22 DIAGNOSIS — J449 Chronic obstructive pulmonary disease, unspecified: Secondary | ICD-10-CM | POA: Diagnosis not present

## 2015-06-22 DIAGNOSIS — Z8052 Family history of malignant neoplasm of bladder: Secondary | ICD-10-CM | POA: Diagnosis not present

## 2015-06-22 DIAGNOSIS — R918 Other nonspecific abnormal finding of lung field: Secondary | ICD-10-CM | POA: Diagnosis not present

## 2015-06-23 ENCOUNTER — Ambulatory Visit: Payer: Medicare Other

## 2015-06-23 ENCOUNTER — Ambulatory Visit
Admission: RE | Admit: 2015-06-23 | Discharge: 2015-06-23 | Disposition: A | Discharge status: Home / Self Care | Payer: Medicare Other | Source: Ambulatory Visit | Attending: Radiation Oncology | Admitting: Radiation Oncology

## 2015-06-23 DIAGNOSIS — R918 Other nonspecific abnormal finding of lung field: Secondary | ICD-10-CM | POA: Diagnosis not present

## 2015-06-23 DIAGNOSIS — Z510 Encounter for antineoplastic radiation therapy: Secondary | ICD-10-CM | POA: Diagnosis not present

## 2015-06-23 DIAGNOSIS — Z8049 Family history of malignant neoplasm of other genital organs: Secondary | ICD-10-CM | POA: Diagnosis not present

## 2015-06-23 DIAGNOSIS — I499 Cardiac arrhythmia, unspecified: Secondary | ICD-10-CM | POA: Diagnosis not present

## 2015-06-23 DIAGNOSIS — C3411 Malignant neoplasm of upper lobe, right bronchus or lung: Secondary | ICD-10-CM | POA: Diagnosis not present

## 2015-06-23 DIAGNOSIS — Z87891 Personal history of nicotine dependence: Secondary | ICD-10-CM | POA: Diagnosis not present

## 2015-06-23 DIAGNOSIS — Z8052 Family history of malignant neoplasm of bladder: Secondary | ICD-10-CM | POA: Diagnosis not present

## 2015-06-23 DIAGNOSIS — N400 Enlarged prostate without lower urinary tract symptoms: Secondary | ICD-10-CM | POA: Diagnosis not present

## 2015-06-23 DIAGNOSIS — I10 Essential (primary) hypertension: Secondary | ICD-10-CM | POA: Diagnosis not present

## 2015-06-23 DIAGNOSIS — J449 Chronic obstructive pulmonary disease, unspecified: Secondary | ICD-10-CM | POA: Diagnosis not present

## 2015-06-24 ENCOUNTER — Ambulatory Visit: Payer: Medicare Other

## 2015-06-24 ENCOUNTER — Other Ambulatory Visit: Payer: Self-pay

## 2015-06-24 ENCOUNTER — Ambulatory Visit
Admission: RE | Admit: 2015-06-24 | Discharge: 2015-06-24 | Disposition: A | Discharge status: Home / Self Care | Payer: Medicare Other | Source: Ambulatory Visit | Attending: Radiation Oncology | Admitting: Radiation Oncology

## 2015-06-30 ENCOUNTER — Encounter
Admission: RE | Admit: 2015-06-30 | Discharge: 2015-06-30 | Disposition: A | Discharge status: Home / Self Care | Payer: Medicare Other | Source: Ambulatory Visit | Attending: Urology | Admitting: Urology

## 2015-06-30 DIAGNOSIS — Z01812 Encounter for preprocedural laboratory examination: Secondary | ICD-10-CM | POA: Diagnosis not present

## 2015-06-30 LAB — BASIC METABOLIC PANEL
Anion gap: 7 (ref 5–15)
BUN: 25 mg/dL — ABNORMAL HIGH (ref 6–20)
CHLORIDE: 102 mmol/L (ref 101–111)
CO2: 30 mmol/L (ref 22–32)
Calcium: 9 mg/dL (ref 8.9–10.3)
Creatinine, Ser: 0.86 mg/dL (ref 0.61–1.24)
GFR calc non Af Amer: >60 mL/min (ref >60)
Glucose, Bld: 120 mg/dL — ABNORMAL HIGH (ref 65–99)
POTASSIUM: 3.9 mmol/L (ref 3.5–5.1)
SODIUM: 139 mmol/L (ref 135–145)

## 2015-06-30 LAB — CBC
HCT: 41.4 % (ref 40.0–52.0)
HEMOGLOBIN: 13.7 g/dL (ref 13.0–18.0)
MCH: 31.4 pg (ref 26.0–34.0)
MCHC: 33.2 g/dL (ref 32.0–36.0)
MCV: 94.7 fL (ref 80.0–100.0)
PLATELETS: 197 10*3/uL (ref 150–440)
RBC: 4.37 MIL/uL — AB (ref 4.40–5.90)
RDW: 14.2 % (ref 11.5–14.5)
WBC: 5.6 10*3/uL (ref 3.8–10.6)

## 2015-06-30 LAB — APTT: APTT: 30 s (ref 24–36)

## 2015-06-30 LAB — PROTIME-INR
INR: 1.1
PROTHROMBIN TIME: 14.4 s (ref 11.4–15.0)

## 2015-06-30 LAB — DIFFERENTIAL
Basophils Absolute: 0.1 10*3/uL (ref 0–0.1)
Basophils Relative: 1 %
Eosinophils Absolute: 0.2 10*3/uL (ref 0–0.7)
Eosinophils Relative: 3 %
LYMPHS ABS: 1.1 10*3/uL (ref 1.0–3.6)
LYMPHS PCT: 20 %
Monocytes Absolute: 0.6 10*3/uL (ref 0.2–1.0)
Monocytes Relative: 10 %
NEUTROS PCT: 66 %
Neutro Abs: 3.6 10*3/uL (ref 1.4–6.5)

## 2015-06-30 NOTE — Patient Instructions (Signed)
  Your procedure is scheduled on: 07/13/2015 Report to Day Surgery. 2ND FLOOR MEDICAL MALL ENTRANCE To find out your arrival time please call [phone] between 1PM - 3PM on 07/12/2015.  Remember: Instructions that are not followed completely may result in serious medical risk, up to and including death, or upon the discretion of your surgeon and anesthesiologist your surgery may need to be rescheduled.    __X__ 1. Do not eat food or drink liquids after midnight. No gum chewing or hard candies.     __X__ 2. No Alcohol for 24 hours before or after surgery.   ____ 3. Bring all medications with you on the day of surgery if instructed.    __X__ 4. Notify your doctor if there is any change in your medical condition     (cold, fever, infections).     Do not wear jewelry, make-up, hairpins, clips or nail polish.  Do not wear lotions, powders, or perfumes. You may wear deodorant.  Do not shave 48 hours prior to surgery. Men may shave face and neck.  Do not bring valuables to the hospital.    [facility] is not responsible for any belongings or valuables.               Contacts, dentures or bridgework may not be worn into surgery.  Leave your suitcase in the car. After surgery it may be brought to your room.  For patients admitted to the hospital, discharge time is determined by your                treatment team.   Patients discharged the day of surgery will not be allowed to drive home.   Please read over the following fact sheets that you were given:   Surgical Site Infection Prevention   __X__ Take these medicines the morning of surgery with A SIP OF WATER:    1. PEPCID  2. GABAPENTIN  3. LISINOPRIL  4. METOPROLOL  5. CRESTOR  6.  ____ Fleet Enema (as directed)   ____ Use CHG Soap as directed  ____ Use inhalers on the day of surgery  ____ Stop metformin 2 days prior to surgery    ____ Take 1/2 of usual insulin dose the night before surgery and none on the morning of  surgery.   __X__ Stop Coumadin/Plavix/aspirin on 07/07/2015  __X__ Stop Anti-inflammatories on 07/07/2015 (NAPROXEN)   ____ Stop supplements until after surgery.    ____ Bring C-Pap to the hospital.

## 2015-06-30 NOTE — Pre-Procedure Instructions (Addendum)
Risk assessment was "acceptable" for bronchoscopy 02/2016 Had resection of bladder 03/2016 Information provided to Dr. Kayleen Memos, ok to proceed  Status: Signed       Expand All Collapse All     Patient ID: Joseph Graham, male DOB: 07/29/1940, 75 y.o. MRN: [id number]  HPI Comments: Joseph Graham is a 75 year old gentleman with remote history of atrial flutter in September 2011, status post ablation (was in [place] at the time), long history of smoking since age 77, COPD, chronic mild shortness of breath with exertion, recent diagnosis of lung mass on CT scan, who presents for preoperative evaluation prior to bronchoscopy. PET scan completed today for staging, possible cancer. CT scan documenting three-vessel CAD as well as aortic atherosclerosis.  In general he reports that he feels well. He is active, does most of his long work, [employer], cooking housework. Denies having any symptoms concerning for angina. If he does get hot or short of breath, he takes a rest that he does not feel it's is abnormal based on his activities. Denies any significant cough, change in breathing pattern, PND or orthopnea, no lower extremity edema. He denies any palpitations concerning for arrhythmia past 5 years He reports cholesterol has not been a problem He reports having borderline diabetes secondary to weight gain over the past several years Total cholesterol 170. 3 years ago was 150  CT scan images reviewed with him showing mild coronary calcifications in the RCA, left main, LAD, left circumflex. Again mild speckled atherosclerosis in the aortic arch, descending aorta.  Recent EKG 02/28/2015 showing normal sinus rhythm, I bundle branch block, left axis deviation  No Known Allergies  Current Outpatient Prescriptions on File Prior to Visit  Medication Sig Dispense Refill  . aspirin EC 81 MG tablet Take 81 mg by mouth daily.    . famotidine (PEPCID) 10 MG tablet Take 10 mg by  mouth as needed for heartburn or indigestion.    . gabapentin (NEURONTIN) 100 MG capsule TAKE 1 CAPSULE BY MOUTH TWICE A DAY AS NEEDED (Patient taking differently: Take 100 mg by mouth 2 (two) times daily. TAKE 1 CAPSULE BY MOUTH TWICE A DAY) 180 capsule 3  . lisinopril (PRINIVIL,ZESTRIL) 10 MG tablet Take 1 tablet (10 mg total) by mouth daily. (Patient taking differently: Take 10 mg by mouth every evening. ) 90 tablet 3  . metoprolol tartrate (LOPRESSOR) 25 MG tablet Take 0.5 tablets (12.5 mg total) by mouth 2 (two) times daily. 90 tablet 3  . Multiple Vitamins-Minerals (MULTIVITAMIN PO) Take 1 tablet by mouth daily.    . Naproxen Sodium (ALEVE) 220 MG CAPS Take by mouth 2 (two) times daily.     Current Facility-Administered Medications on File Prior to Visit  Medication Dose Route Frequency Provider Last Rate Last Dose  . fludeoxyglucose F - 18 (FDG) injection 12.68 milli Curie 12.68 milli Curie Intravenous Once PRN Medication Radiologist, MD  12.68 milli Curie at 03/01/15 2979    Past Medical History  Diagnosis Date  . Colon polyps     next colonoscopy due around 2018  . HTN (hypertension)   . Alcohol abuse, in remission     remote  . Cardiac arrhythmia 03/2010    h/o a flutter and CM, s/p ablation, normal stress test  . Benign essential tremor     improved on metoprolol and gabapentin  . Dyslipidemia     mild off meds  . Chronic low back pain     MRI 2004, multilevel DDD  .  BPH (benign prostatic hypertrophy) 12/30/2012  . Personal history of tobacco use, presenting hazards to health 02/18/2015    quit 06/2011, restarted 2014  . Multiple pulmonary nodules 02/2015  . COPD (chronic obstructive pulmonary disease)   . GERD (gastroesophageal reflux disease)   . Arthritis     BACK AND NECK  . Complication of anesthesia     DID NOT GET COMPLETELY NUMB WITH TONSILLECTOMY   . Shortness of breath dyspnea     OCC WITH EXERTION    Past Surgical History  Procedure Laterality Date  . Hemorroidectomy  1982  . A flutter ablation  03/2010  . Cataract extraction    . Carotid US  03/2010    WNL  . Colonoscopy  09/2011    polyps, rpt due 5 yrs, mild diverticulosis Joseph Graham)  . Tonsillectomy  1964    Social History  reports that he has been smoking Cigarettes. He has a 30.5 pack-year smoking history. He has never used smokeless tobacco. He reports that he does not drink alcohol or use illicit drugs.  Family History family history includes Cancer (age of onset: 12) in his brother and mother; Hypertension in his father; Parkinsonism in his father; Stroke (age of onset: 68) in his brother; Tremor in his father. There is no history of Coronary artery disease or Diabetes.    Review of Systems  Constitutional: Negative.  HENT: Negative.  Respiratory: Positive for shortness of breath.  Cardiovascular: Negative.  Gastrointestinal: Negative.  Musculoskeletal: Negative.  Neurological: Negative.  Hematological: Negative.  Psychiatric/Behavioral: Negative.  All other systems reviewed and are negative.  BP 154/72 mmHg  Pulse 61  Ht '5\' 8"'$  (1.727 m)  Wt 207 lb 8 oz (94.121 kg)  BMI 31.56 kg/m2  Physical Exam  Constitutional: He is oriented to person, place, and time. He appears well-developed and well-nourished.  HENT:  Head: Normocephalic.  Nose: Nose normal.  Mouth/Throat: Oropharynx is clear and moist.  Eyes: Conjunctivae are normal. Pupils are equal, round, and reactive to light.  Neck: Normal range of motion. Neck supple. No JVD present.  Cardiovascular: Normal rate, regular rhythm, normal heart sounds and intact distal pulses. Exam reveals no gallop and no friction rub.  No murmur heard. Pulmonary/Chest: Effort normal and breath sounds normal. No respiratory distress. He has no wheezes. He has no  rales. He exhibits no tenderness.  Abdominal: Soft. Bowel sounds are normal. He exhibits no distension. There is no tenderness.  Musculoskeletal: Normal range of motion. He exhibits no edema or tenderness.  Lymphadenopathy:   He has no cervical adenopathy.  Neurological: He is alert and oriented to person, place, and time. Coordination normal.  Skin: Skin is warm and dry. No rash noted. No erythema.  Psychiatric: He has a normal mood and affect. His behavior is normal. Judgment and thought content normal.                  Coronary artery disease involving native coronary artery of native heart without angina pectoris - Minna Merritts, MD at 03/01/2015 12:30 PM     Status: Written Related Problem: Coronary artery disease involving native coronary artery of native heart without angina pectoris   Expand All Collapse All   Mild diffuse coronary atherosclerosis, aortic atherosclerosis Recommended smoking cessation, Chantix prescription provided We'll start Crestor 5 mill grams daily, goal LDL less than 70 Currently with no symptoms of angina. No stress testing at this time.            Pre-operative  clearance - Minna Merritts, MD at 03/01/2015 12:31 PM     Status: Written Related Problem: Pre-operative clearance   Expand All Collapse All   Acceptable risk for bronchoscopy tomorrow. Currently with no symptoms of angina. Asymptomatic coronary artery disease based off CT scan. Likely secondary to long history of smoking            Smoker - Minna Merritts, MD at 03/01/2015 12:31 PM     Status: Written Related Problem: Smoker   Expand All Collapse All   Smoking cessation discussed with him. He will start Chantix Prescription provided            Pulmonary nodules/lesions, multiple - Minna Merritts, MD at 03/01/2015 12:31 PM     Status: Written Related Problem: Pulmonary nodules/lesions, multiple   Expand All Collapse All   Possible lung cancer.  Bronchoscopy scheduled tomorrow PET scan performed today. Results not available            Essential hypertension - Minna Merritts, MD at 03/01/2015 12:32 PM     Status: Written Related Problem: Essential hypertension   Expand All Collapse All   Blood pressure borderline elevated today. Suggested he monitor his pressure at home. Further adjustments to the medications based on his numbers            PAD (peripheral artery disease) - Minna Merritts, MD at 03/01/2015 12:32 PM     Status: Written Related Problem: PAD (peripheral artery disease)   Expand All Collapse All   Mild plaquing noted in his aorta, arch and descending We'll recommend aggressive cholesterol control, smoking cessation             Referring Provider     [name], MD     Diagnoses     SOB (shortness of breath) - Primary    ICD-9-CM: 786.05 ICD-10-CM: R06.02    Pre-operative clearance     ICD-9-CM: V72.84 ICD-10-CM: Z01.818    Hyperlipidemia     ICD-9-CM: 272.4 ICD-10-CM: E78.5    Coronary artery disease involving native coronary artery of native heart without angina pectoris     ICD-9-CM: 414.01 ICD-10-CM: I25.10    PAD (peripheral artery disease)     ICD-9-CM: 443.9 ICD-10-CM: I73.9    Essential hypertension     ICD-9-CM: 401.9 ICD-10-CM: I10    Smoker     ICD-9-CM: 305.1 ICD-10-CM: Z72.0    Pulmonary nodules/lesions, multiple     ICD-9-CM: 793.19 ICD-10-CM: R91.8       Reason for Visit     other    Cardiac clearance per Dr. Stevenson Clinch c/o sob. Pt refused EKG because "he said he just had one."Meds reviewed verbally with pt.    Reason for Visit History        Ordered Medications       Disp Refills Start End    rosuvastatin (CRESTOR) 5 MG tablet 90 tablet 3 03/01/2015     Take 1 tablet (5 mg total) by mouth daily. - Oral    varenicline (CHANTIX CONTINUING MONTH PAK) 1 MG tablet (Discontinued) 60 tablet 6  03/01/2015 03/21/2015    Take 1 tablet (1 mg total) by mouth 2 (two) times daily. - Oral      Level of Service     PR OFFICE OUTPATIENT NEW 45 MINUTES [99204]   LOS History      Follow-up and Disposition     Return in about 1 year (around 02/29/2016).   Routing History  All Charges for This Encounter     Code Description Service Date Service Provider Modifiers Qty    [phone] PR OFFICE OUTPATIENT NEW 45 MINUTES 03/01/2015 Minna Merritts, MD  1      Routing History     From: Minna Merritts, MD On: 03/01/2015 12:33 PM    To: [name], MD    Priority: Routine    Routing Comments:     This gentleman should be acceptable risk for bronchoscopy tomorrow, Wednesday thx [facility] [place]            AVS Reports     Date/Time Report Action User    03/01/2015 12:12 PM After Visit Summary Printed Minna Merritts, MD      Patient Instructions     You are doing well.  Please start crestor 5 mg daily for cholesterol  Start chantix 1/2 pill once a day for a few days then 1/2 pill twice a day for a week Then full pill twice a day  Please call us if you have new issues that need to be addressed before your next appt.  Your physician wants you to follow-up in: 12 months.  You will receive a reminder letter in the mail two months in advance. If you don't receive a letter, please call our office to schedule the follow-up appointment.        Patient Instructions History      Routing History     There are no sent or routed communications associated with this encounter.     Chart Reviewed By     [name], MD on 03/01/2015 3:02 PM     Previous Visit       Provider Department Encounter #    02/24/2015 10:09 AM Ida Rogue, MD [facility] [id number]

## 2015-07-02 LAB — URINE CULTURE

## 2015-07-13 ENCOUNTER — Ambulatory Visit: Payer: Medicare Other | Admitting: Anesthesiology

## 2015-07-13 ENCOUNTER — Encounter
Admission: RE | Disposition: A | Discharge status: Home / Self Care | Payer: Self-pay | Source: Ambulatory Visit | Attending: Urology

## 2015-07-13 ENCOUNTER — Telehealth: Payer: Self-pay

## 2015-07-13 ENCOUNTER — Encounter: Payer: Self-pay | Admitting: *Deleted

## 2015-07-13 ENCOUNTER — Ambulatory Visit
Admission: RE | Admit: 2015-07-13 | Discharge: 2015-07-13 | Disposition: A | Discharge status: Home / Self Care | Payer: Medicare Other | Source: Ambulatory Visit | Attending: Urology | Admitting: Urology

## 2015-07-13 DIAGNOSIS — Z820 Family history of epilepsy and other diseases of the nervous system: Secondary | ICD-10-CM | POA: Diagnosis not present

## 2015-07-13 DIAGNOSIS — R0602 Shortness of breath: Secondary | ICD-10-CM | POA: Diagnosis not present

## 2015-07-13 DIAGNOSIS — Z86010 Personal history of colonic polyps: Secondary | ICD-10-CM | POA: Diagnosis not present

## 2015-07-13 DIAGNOSIS — K219 Gastro-esophageal reflux disease without esophagitis: Secondary | ICD-10-CM | POA: Diagnosis not present

## 2015-07-13 DIAGNOSIS — J449 Chronic obstructive pulmonary disease, unspecified: Secondary | ICD-10-CM | POA: Insufficient documentation

## 2015-07-13 DIAGNOSIS — Z8052 Family history of malignant neoplasm of bladder: Secondary | ICD-10-CM | POA: Diagnosis not present

## 2015-07-13 DIAGNOSIS — Z8249 Family history of ischemic heart disease and other diseases of the circulatory system: Secondary | ICD-10-CM | POA: Diagnosis not present

## 2015-07-13 DIAGNOSIS — G8929 Other chronic pain: Secondary | ICD-10-CM | POA: Diagnosis not present

## 2015-07-13 DIAGNOSIS — M545 Low back pain: Secondary | ICD-10-CM | POA: Insufficient documentation

## 2015-07-13 DIAGNOSIS — I2510 Atherosclerotic heart disease of native coronary artery without angina pectoris: Secondary | ICD-10-CM | POA: Diagnosis not present

## 2015-07-13 DIAGNOSIS — I4892 Unspecified atrial flutter: Secondary | ICD-10-CM | POA: Diagnosis not present

## 2015-07-13 DIAGNOSIS — Z7982 Long term (current) use of aspirin: Secondary | ICD-10-CM | POA: Insufficient documentation

## 2015-07-13 DIAGNOSIS — Z79899 Other long term (current) drug therapy: Secondary | ICD-10-CM | POA: Insufficient documentation

## 2015-07-13 DIAGNOSIS — N400 Enlarged prostate without lower urinary tract symptoms: Secondary | ICD-10-CM | POA: Insufficient documentation

## 2015-07-13 DIAGNOSIS — Z87891 Personal history of nicotine dependence: Secondary | ICD-10-CM | POA: Insufficient documentation

## 2015-07-13 DIAGNOSIS — G250 Essential tremor: Secondary | ICD-10-CM | POA: Insufficient documentation

## 2015-07-13 DIAGNOSIS — M4690 Unspecified inflammatory spondylopathy, site unspecified: Secondary | ICD-10-CM | POA: Insufficient documentation

## 2015-07-13 DIAGNOSIS — F1010 Alcohol abuse, uncomplicated: Secondary | ICD-10-CM | POA: Diagnosis not present

## 2015-07-13 DIAGNOSIS — M4692 Unspecified inflammatory spondylopathy, cervical region: Secondary | ICD-10-CM | POA: Insufficient documentation

## 2015-07-13 DIAGNOSIS — Z8049 Family history of malignant neoplasm of other genital organs: Secondary | ICD-10-CM | POA: Diagnosis not present

## 2015-07-13 DIAGNOSIS — C679 Malignant neoplasm of bladder, unspecified: Secondary | ICD-10-CM | POA: Insufficient documentation

## 2015-07-13 DIAGNOSIS — Z823 Family history of stroke: Secondary | ICD-10-CM | POA: Diagnosis not present

## 2015-07-13 DIAGNOSIS — Z923 Personal history of irradiation: Secondary | ICD-10-CM | POA: Diagnosis not present

## 2015-07-13 DIAGNOSIS — E785 Hyperlipidemia, unspecified: Secondary | ICD-10-CM | POA: Diagnosis not present

## 2015-07-13 DIAGNOSIS — R918 Other nonspecific abnormal finding of lung field: Secondary | ICD-10-CM | POA: Diagnosis not present

## 2015-07-13 DIAGNOSIS — C3490 Malignant neoplasm of unspecified part of unspecified bronchus or lung: Secondary | ICD-10-CM | POA: Diagnosis not present

## 2015-07-13 DIAGNOSIS — Z9849 Cataract extraction status, unspecified eye: Secondary | ICD-10-CM | POA: Diagnosis not present

## 2015-07-13 DIAGNOSIS — I739 Peripheral vascular disease, unspecified: Secondary | ICD-10-CM | POA: Diagnosis not present

## 2015-07-13 DIAGNOSIS — Z8551 Personal history of malignant neoplasm of bladder: Secondary | ICD-10-CM | POA: Diagnosis not present

## 2015-07-13 DIAGNOSIS — I10 Essential (primary) hypertension: Secondary | ICD-10-CM | POA: Diagnosis not present

## 2015-07-13 HISTORY — PX: TRANSURETHRAL RESECTION OF BLADDER TUMOR: SHX2575

## 2015-07-13 SURGERY — TURBT (TRANSURETHRAL RESECTION OF BLADDER TUMOR)
Anesthesia: General | Wound class: Clean Contaminated

## 2015-07-13 MED ORDER — CEFAZOLIN SODIUM-DEXTROSE 2-3 GM-% IV SOLR
2.0000 g | Freq: Once | INTRAVENOUS | Status: AC
  Administered 2015-07-13: 2 g via INTRAVENOUS

## 2015-07-13 MED ORDER — PROPOFOL 10 MG/ML IV BOLUS
INTRAVENOUS | Status: DC | PRN
  Administered 2015-07-13: 200 mg via INTRAVENOUS

## 2015-07-13 MED ORDER — HYDROCODONE-ACETAMINOPHEN 5-325 MG PO TABS: 1.0000 | ORAL_TABLET | Freq: Four times a day (QID) | ORAL | Status: DC | PRN

## 2015-07-13 MED ORDER — LIDOCAINE HCL (CARDIAC) 20 MG/ML IV SOLN
INTRAVENOUS | Status: DC | PRN
  Administered 2015-07-13: 100 mg via INTRAVENOUS

## 2015-07-13 MED ORDER — FENTANYL CITRATE (PF) 100 MCG/2ML IJ SOLN: 25.0000 ug | INTRAMUSCULAR | Status: DC | PRN

## 2015-07-13 MED ORDER — FENTANYL CITRATE (PF) 100 MCG/2ML IJ SOLN
INTRAMUSCULAR | Status: DC | PRN
  Administered 2015-07-13 (×2): 50 ug via INTRAVENOUS

## 2015-07-13 MED ORDER — ACETAMINOPHEN 10 MG/ML IV SOLN
INTRAVENOUS | Status: DC | PRN
  Administered 2015-07-13: 1000 mg via INTRAVENOUS

## 2015-07-13 MED ORDER — LACTATED RINGERS IV SOLN
INTRAVENOUS | Status: DC
  Administered 2015-07-13: 13:00:00 via INTRAVENOUS

## 2015-07-13 MED ORDER — MIDAZOLAM HCL 2 MG/2ML IJ SOLN
INTRAMUSCULAR | Status: DC | PRN
  Administered 2015-07-13: 2 mg via INTRAVENOUS

## 2015-07-13 MED ORDER — ONDANSETRON HCL 4 MG/2ML IJ SOLN: 4.0000 mg | Freq: Once | INTRAMUSCULAR | Status: DC | PRN

## 2015-07-13 MED ORDER — ACETAMINOPHEN 10 MG/ML IV SOLN
INTRAVENOUS | Status: AC
  Filled 2015-07-13: qty 100

## 2015-07-13 MED ORDER — GLYCOPYRROLATE 0.2 MG/ML IJ SOLN
INTRAMUSCULAR | Status: DC | PRN
  Administered 2015-07-13: 0.2 mg via INTRAVENOUS

## 2015-07-13 MED ORDER — CEPHALEXIN 500 MG PO CAPS: 500.0000 mg | ORAL_CAPSULE | Freq: Three times a day (TID) | ORAL | Status: DC

## 2015-07-13 MED ORDER — CEFAZOLIN SODIUM-DEXTROSE 2-3 GM-% IV SOLR
INTRAVENOUS | Status: AC
  Administered 2015-07-13: 2 g via INTRAVENOUS
  Filled 2015-07-13: qty 50

## 2015-07-13 MED ORDER — FAMOTIDINE 20 MG PO TABS
ORAL_TABLET | ORAL | Status: AC
  Administered 2015-07-13: 20 mg
  Filled 2015-07-13: qty 1

## 2015-07-13 SURGICAL SUPPLY — 45 items
BACTOSHIELD CHG 4% 4OZ (MISCELLANEOUS) ×2
BAG DRAIN CYSTO-URO LG1000N (MISCELLANEOUS) ×3 IMPLANT
BAG URO DRAIN 2000ML W/SPOUT (MISCELLANEOUS) ×3 IMPLANT
BLADE SURG 15 STRL LF DISP TIS (BLADE) IMPLANT
BLADE SURG 15 STRL SS (BLADE)
CANISTER SUCT 1200ML W/VALVE (MISCELLANEOUS) ×3 IMPLANT
CATH FOLEY 2WAY  5CC 16FR (CATHETERS) ×2
CATH URTH 16FR FL 2W BLN LF (CATHETERS) ×1 IMPLANT
CHLORAPREP W/TINT 26ML (MISCELLANEOUS) IMPLANT
CORD URO TURP 10FT (MISCELLANEOUS) ×3 IMPLANT
DRAPE LAPAROTOMY 77X122 PED (DRAPES) ×3 IMPLANT
ELECT CAUTERY NEEDLE TIP 1.0 (MISCELLANEOUS)
ELECT LOOP 22F BIPOLAR SML (ELECTROSURGICAL) ×3
ELECT REM PT RETURN 9FT ADLT (ELECTROSURGICAL) ×3
ELECTRODE CAUTERY NEDL TIP 1.0 (MISCELLANEOUS) IMPLANT
ELECTRODE LOOP 22F BIPOLAR SML (ELECTROSURGICAL) ×1 IMPLANT
ELECTRODE REM PT RTRN 9FT ADLT (ELECTROSURGICAL) ×1 IMPLANT
GAUZE PETROLATUM 1 X8 (GAUZE/BANDAGES/DRESSINGS) IMPLANT
GAUZE STRETCH 2X75IN STRL (MISCELLANEOUS) IMPLANT
GLOVE BIO SURGEON STRL SZ 6.5 (GLOVE) ×2 IMPLANT
GLOVE BIO SURGEON STRL SZ7.5 (GLOVE) ×3 IMPLANT
GLOVE BIO SURGEONS STRL SZ 6.5 (GLOVE) ×1
GOWN STRL REUS W/ TWL LRG LVL3 (GOWN DISPOSABLE) ×2 IMPLANT
GOWN STRL REUS W/TWL LRG LVL3 (GOWN DISPOSABLE) ×4
KIT RM TURNOVER CYSTO AR (KITS) ×3 IMPLANT
KIT RM TURNOVER STRD PROC AR (KITS) ×3 IMPLANT
LABEL OR SOLS (LABEL) ×3 IMPLANT
LOOP CUT BIPOLAR 24F LRG (ELECTROSURGICAL) ×3 IMPLANT
NEEDLE HYPO 25X1 1.5 SAFETY (NEEDLE) IMPLANT
NS IRRIG 500ML POUR BTL (IV SOLUTION) ×3 IMPLANT
PACK BASIN MINOR ARMC (MISCELLANEOUS) ×3 IMPLANT
PACK CYSTO AR (MISCELLANEOUS) ×3 IMPLANT
PREP PVP WINGED SPONGE (MISCELLANEOUS) ×3 IMPLANT
SCRUB CHG 4% DYNA-HEX 4OZ (MISCELLANEOUS) ×1 IMPLANT
SET IRRIG Y TYPE TUR BLADDER L (SET/KITS/TRAYS/PACK) ×3 IMPLANT
SOL .9 NS 3000ML IRR  AL (IV SOLUTION) ×4
SOL .9 NS 3000ML IRR UROMATIC (IV SOLUTION) ×2 IMPLANT
SOL PREP PVP 2OZ (MISCELLANEOUS) ×3
SOLUTION PREP PVP 2OZ (MISCELLANEOUS) ×1 IMPLANT
SURGILUBE 2OZ TUBE FLIPTOP (MISCELLANEOUS) ×3 IMPLANT
SUT MNCRL AB 4-0 PS2 18 (SUTURE) ×9 IMPLANT
SYRINGE 10CC LL (SYRINGE) ×3 IMPLANT
SYRINGE IRR TOOMEY STRL 70CC (SYRINGE) ×3 IMPLANT
WATER STERILE IRR 1000ML POUR (IV SOLUTION) ×3 IMPLANT
WATER STERILE IRR 3000ML UROMA (IV SOLUTION) ×3 IMPLANT

## 2015-07-13 NOTE — Transfer of Care (Signed)
Immediate Anesthesia Transfer of Care Note  Patient: Joseph Graham  Procedure(s) Performed: Procedure(s): TRANSURETHRAL RESECTION OF BLADDER TUMOR (TURBT) (N/A)  Patient Location: PACU  Anesthesia Type:General  Level of Consciousness: sedated  Airway & Oxygen Therapy: Patient Spontanous Breathing and Patient connected to face mask oxygen  Post-op Assessment: Report given to RN and Post -op Vital signs reviewed and stable  Post vital signs: Reviewed and stable  Last Vitals:  Filed Vitals:   07/13/15 1248 07/13/15 1543  BP: 146/61 125/60  Pulse: 68 79  Temp: 36.4 C 36.3 C  Resp: 16 14    Complications: No apparent anesthesia complications

## 2015-07-13 NOTE — Anesthesia Procedure Notes (Signed)
Procedure Name: LMA Insertion Date/Time: 07/13/2015 3:13 PM Performed by: Doreen Salvage Pre-anesthesia Checklist: Patient identified, Patient being monitored, Timeout performed, Emergency Drugs available and Suction available Patient Re-evaluated:Patient Re-evaluated prior to inductionOxygen Delivery Method: Circle system utilized Preoxygenation: Pre-oxygenation with 100% oxygen Intubation Type: IV induction Ventilation: Mask ventilation without difficulty LMA: LMA inserted LMA Size: 4.5 Tube type: Oral Number of attempts: 1 Placement Confirmation: positive ETCO2 and breath sounds checked- equal and bilateral Tube secured with: Tape Dental Injury: Teeth and Oropharynx as per pre-operative assessment

## 2015-07-13 NOTE — Discharge Instructions (Signed)

## 2015-07-13 NOTE — Telephone Encounter (Signed)
-----   Message from [name], MD sent at 07/13/2015  3:34 PM EST ----- Patient needs to follow up Thursday 07/13/14 in early morning for trial of void//foley pull.  He also needs to see me in 1-2 weeks for pathology. thanks

## 2015-07-13 NOTE — H&P (Signed)
Progress Notes   Joseph Graham (MR# [id number])      Progress Notes Info    Author Note Status Last Update User Last Update Date/Time   [name], MD Signed [name], MD 05/20/2015 11:38 AM    Progress Notes    Expand All Collapse All       Joseph Graham [birth year] [id number]  Referring provider: [name], MD [address] [place], [place] [address]  Chief Complaint  Patient presents with  . Follow-up    pt had surgery 03/2015 TURBT    HPI: The patient is a 75 year old gentleman who has both lung and bladder lesions. He had a 2-3 cm exophytic lesion in the midline posterior bladder wall superior to the trigone during office cystoscopy at his last visit. At that time, there was more concern about his lung lesion, so we planned to have his lung lesion addressed first with the TURBT to follow lung surgery . He had a lung biopsy which showed poorly differentiated carcinoma of unclear origin. There is concern it may be metastasis from the bladder, so he was sent back now for resection of his bladder tumor as well as to get a tissue specimen to determine the next step in his treatment. He has since undergone TURBT. His pathology shows noninvasive high-grade urothelial carcinoma. Is unclear TA or T1 disease based on the pathology report. There was no muscle in the specimen. It is a different carcinoma morphologically from his lung cancer. It is not clear at this time whether here not receiving chemotherapy as part of his undifferentiated carcinoma of the lung treatment.  Interval History:  The patient has done well since being seen in our clinic. He is undergoing radiation currently for his lung cancer. Is scheduled to be finished mid January 2017. He is not scheduled to receive chemotherapy at this time. He does note occasional spraying of his stream from his meatal stenosis. He is not using his meatal dilator. He does not desire any further  dilation this time due to pain.   PMH: Past Medical History  Diagnosis Date  . Colon polyps     next colonoscopy due around 2018  . HTN (hypertension)   . Alcohol abuse, in remission     remote  . Benign essential tremor     improved on metoprolol and gabapentin  . Dyslipidemia     mild off meds  . Chronic low back pain     MRI 2004, multilevel DDD  . BPH (benign prostatic hypertrophy) 12/30/2012  . Personal history of tobacco use, presenting hazards to health 02/18/2015    quit 06/2011, restarted 2014  . Multiple pulmonary nodules 02/2015  . COPD (chronic obstructive pulmonary disease) ([place])   . GERD (gastroesophageal reflux disease)   . Arthritis     BACK AND NECK  . Shortness of breath dyspnea     OCC WITH EXERTION  . Complication of anesthesia     DID NOT GET COMPLETELY NUMB WITH TONSILLECTOMY  . Cardiac arrhythmia 03/2010    h/o a flutter and CM, s/p ablation, normal stress test  . Primary cancer of bladder ([place]) 2016    Joseph Graham  . Primary lung cancer ([place]) 2016    Surgical History: Past Surgical History  Procedure Laterality Date  . Hemorroidectomy  1982  . A flutter ablation  03/2010  . Cataract extraction    . Carotid US  03/2010    WNL  . Colonoscopy  09/2011  polyps, rpt due 5 yrs, mild diverticulosis Joseph Graham)  . Tonsillectomy  1964  . Electromagnetic navigation brochoscopy N/A 03/02/2015    Procedure: ELECTROMAGNETIC NAVIGATION BRONCHOSCOPY; Surgeon: [name], MD; Location: [facility] ORS; Service: Cardiopulmonary; Laterality: N/A;  . Transurethral resection of bladder tumor N/A 04/06/2015    Procedure: TRANSURETHRAL RESECTION OF BLADDER TUMOR (TURBT) right ureteral stent placement; Surgeon: [name], MD; Location: [facility] OR[facility]; Service: Urology; Laterality: N/A;  . Cystoscopy w/ retrogrades Bilateral  04/06/2015    Procedure: CYSTOSCOPY WITH RETROGRADE PYELOGRAM; Surgeon: [name], MD; Location: [facility] OR[facility]; Service: Urology; Laterality: Bilateral;  . Urinary stent removal  04/14/15    Home Medications:    Medication List       This list is accurate as of: 05/20/15 11:35 AM. Always use your most recent med list.              aspirin EC 81 MG tablet  Take 81 mg by mouth daily.     famotidine 10 MG tablet  Commonly known as: PEPCID  Take 10 mg by mouth 2 (two) times daily.     gabapentin 100 MG capsule  Commonly known as: NEURONTIN  TAKE 1 CAPSULE BY MOUTH TWICE A DAY AS NEEDED     lisinopril 10 MG tablet  Commonly known as: PRINIVIL,ZESTRIL  Take 1 tablet (10 mg total) by mouth daily.     metoprolol tartrate 25 MG tablet  Commonly known as: LOPRESSOR  Take 0.5 tablets (12.5 mg total) by mouth 2 (two) times daily.     naproxen sodium 220 MG tablet  Commonly known as: ANAPROX  Take 220 mg by mouth 2 (two) times daily with a meal.     rosuvastatin 5 MG tablet  Commonly known as: CRESTOR  Take 1 tablet (5 mg total) by mouth daily.        Allergies: No Known Allergies  Family History: Family History  Problem Relation Age of Onset  . Cervical cancer Mother 49  . Hypertension Father   . Stroke Brother 54  . Tremor Father     and several uncles/aunts (not parkinson's)  . Bladder Cancer Brother 35  . Prostate cancer Neg Hx   . Kidney cancer Neg Hx     Social History:  reports that he quit smoking about 2 months ago. His smoking use included Cigarettes. He has a 91.5 pack-year smoking history. He has never used smokeless tobacco. He reports that he does not drink alcohol or use illicit drugs.  ROS: UROLOGY Frequent Urination?: No Hard to postpone urination?: No Burning/pain with urination?: No Get up at night to urinate?: No Leakage of urine?:  No Urine stream starts and stops?: No Trouble starting stream?: No Do you have to strain to urinate?: No Blood in urine?: No Urinary tract infection?: No Sexually transmitted disease?: No Injury to kidneys or bladder?: No Painful intercourse?: No Weak stream?: No Erection problems?: No Penile pain?: No  Gastrointestinal Nausea?: No Vomiting?: No Indigestion/heartburn?: Yes Diarrhea?: No Constipation?: No  Constitutional Fever: No Night sweats?: No Weight loss?: No Fatigue?: No  Skin Skin rash/lesions?: No Itching?: No  Eyes Blurred vision?: No Double vision?: No  Ears/Nose/Throat Sore throat?: No Sinus problems?: No  Hematologic/Lymphatic Swollen glands?: No Easy bruising?: No  Cardiovascular Leg swelling?: No Chest pain?: No  Respiratory Cough?: No Shortness of breath?: No  Endocrine Excessive thirst?: No  Musculoskeletal Back pain?: No Joint pain?: No  Neurological Headaches?: No Dizziness?: No  Psychologic Depression?: No Anxiety?: No  Physical Exam: BP 128/69 mmHg  Pulse 78  Ht '5\' 9"'$  (1.753 m)  Wt 214 lb 8 oz (97.297 kg)  BMI 31.66 kg/m2  Constitutional: Alert and oriented, No acute distress. HEENT: [place] AT, moist mucus membranes. Trachea midline, no masses. Cardiovascular: No clubbing, cyanosis, or edema. RRR. Respiratory: Normal respiratory effort, no increased work of breathing. GI: Abdomen is soft, nontender, nondistended, no abdominal masses GU: No CVA tenderness. Meatus patent but somewhat stenotic. Skin: No rashes, bruises or suspicious lesions. Lymph: No cervical or inguinal adenopathy. Neurologic: Grossly intact, no focal deficits, moving all 4 extremities. Psychiatric: Normal mood and affect.  Laboratory Data:  Recent Labs    Lab Results  Component Value Date   WBC 6.5 05/18/2015   HGB 13.2 05/18/2015   HCT 39.7* 05/18/2015   MCV 93.8 05/18/2015   PLT 224 05/18/2015       Recent Labs     Lab Results  Component Value Date   CREATININE 0.93 03/22/2015       Recent Labs    Lab Results  Component Value Date   PSA 0.60 01/24/2015   PSA 0.68 12/31/2013   PSA 0.67 12/30/2012       Recent Labs    No results found for: TESTOSTERONE     Recent Labs    Lab Results  Component Value Date   HGBA1C 6.3 01/24/2015      Urinalysis  Labs (Brief)       Component Value Date/Time   GLUCOSEU Negative 04/14/2015 0822   BILIRUBINUR Negative 04/14/2015 0822   NITRITE Negative 04/14/2015 0822   LEUKOCYTESUR 1+* 04/14/2015 0822      Assessment & Plan:   The patient has non-invasive high-grade urothelial carcinoma with no muscle in the specimen. I discussed with the patient that the standard of care would be to perform a reresection followed by BCG induction and maintenance therapy. However this is complicated by the fact that he is about to undergo treatment for undifferentiated lung carcinoma. He is scheduled to finish his radiation therapy in mid January 2017, he does not want to undergo anything that could potentially interfere with that therapy including surgery or BCG-induced sepsis. He'll be scheduled for repeat TURBT in late January after completing his radiation therapy. We'll perform meatal dilation at that time. He'll be a candidate for intravesical BCG at that time. However, if he does need chemotherapy for his lung cancer we would need to consider intravesical mitomycin instead as intravesical BCG would be contraindicated.  1. High grade noninvasive TCC of bladder  -Repeat TURBT after completing radiation therapy  2. Meatal Stenosis -Will perform dilation the time of his repeat TURBT  [name], [place] [address], [place], [place] [address] [phone]

## 2015-07-13 NOTE — Anesthesia Preprocedure Evaluation (Signed)
Anesthesia Evaluation  Patient identified by MRN, date of birth, ID band Patient awake    Reviewed: Allergy & Precautions, H&P , NPO status , Patient's Chart, lab work & pertinent test results, reviewed documented beta blocker date and time   History of Anesthesia Complications Negative for: history of anesthetic complications  Airway Mallampati: III  TM Distance: >3 FB Neck ROM: full    Dental no notable dental hx. (+) Missing, Chipped, Poor Dentition   Pulmonary shortness of breath and with exertion, neg sleep apnea, COPD, neg recent URI, former smoker,  Lung cancer s/p radiation   Pulmonary exam normal breath sounds clear to auscultation       Cardiovascular Exercise Tolerance: Good hypertension, (-) angina+ CAD and + Peripheral Vascular Disease  (-) Past MI, (-) Cardiac Stents and (-) CABG Normal cardiovascular exam+ dysrhythmias (s/p ablation) Atrial Fibrillation (-) Valvular Problems/Murmurs Rhythm:regular Rate:Normal     Neuro/Psych negative neurological ROS  negative psych ROS   GI/Hepatic Neg liver ROS, GERD  Medicated and Controlled,  Endo/Other  negative endocrine ROS  Renal/GU negative Renal ROS  negative genitourinary   Musculoskeletal   Abdominal   Peds  Hematology negative hematology ROS (+)   Anesthesia Other Findings Past Medical History:   Colon polyps                                                   Comment:next colonoscopy due around 2018   HTN (hypertension)                                           Alcohol abuse, in remission                                    Comment:remote   Benign essential tremor                                        Comment:improved on metoprolol and gabapentin   Dyslipidemia                                                   Comment:mild off meds   Chronic low back pain                                          Comment:MRI 2004, multilevel DDD   BPH (benign  prostatic hypertrophy)              12/30/2012    Personal history of tobacco use, presenting ha* 02/18/2015       Comment:quit 06/2011, restarted 2014   Multiple pulmonary nodules                      02/2015       COPD (chronic obstructive pulmonary disease) [place] [facility]  GERD (gastroesophageal reflux disease)                       Arthritis                                                      Comment:BACK AND NECK   Shortness of breath dyspnea                                    Comment:OCC WITH EXERTION   Cardiac arrhythmia                              03/2010        Comment:h/o a flutter and CM, s/p ablation, normal               stress test   Primary cancer of bladder ([place])                 2016           Comment:[facility]   Primary lung cancer ([place])                       0370         Complication of anesthesia                                     Comment:DID NOT GET COMPLETELY NUMB WITH TONSILLECTOMY   Reproductive/Obstetrics negative OB ROS                             Anesthesia Physical Anesthesia Plan  ASA: III  Anesthesia Plan: General   Post-op Pain Management:    Induction:   Airway Management Planned:   Additional Equipment:   Intra-op Plan:   Post-operative Plan:   Informed Consent: I have reviewed the patients History and Physical, chart, labs and discussed the procedure including the risks, benefits and alternatives for the proposed anesthesia with the patient or authorized representative who has indicated his/her understanding and acceptance.   Dental Advisory Given  Plan Discussed with: Anesthesiologist, CRNA and Surgeon  Anesthesia Plan Comments:         Anesthesia Quick Evaluation

## 2015-07-13 NOTE — Op Note (Addendum)
Date of procedure: 07/13/2015  Preoperative diagnosis:  1. Bladder cancer   Postoperative diagnosis:  1. Bladder cancer   Procedure: 1. Transurethral resection of bladder tumor (2 cm) 2. Meatal dilation  Surgeon: Baruch Gouty, MD  Anesthesia: General  Complications: None  Intraoperative findings: The previous resected bladder cancer site just medial to the right ureteral orifice was visualized. There is no obvious regrowth of tumor there. This area is resected down to muscle.  EBL: None  Specimens: Base of bladder tumor  Drains: 20 French Foley catheter  Disposition: Stable to the postanesthesia care unit  Indication for procedure: The patient is a 75 y.o. male with history of high-grade T1 bladder cancer with no muscle specimen. He presents today for reresection..  After reviewing the management options for treatment, the patient elected to proceed with the above surgical procedure(s). We have discussed the potential benefits and risks of the procedure, side effects of the proposed treatment, the likelihood of the patient achieving the goals of the procedure, and any potential problems that might occur during the procedure or recuperation. Informed consent has been obtained.  Description of procedure: The patient was met in the preoperative area. All risks, benefits, and indications of the procedure were described in great detail. The patient consented to the procedure. Preoperative antibiotics were given. The patient was taken to the operative theater. General anesthesia was induced per the anesthesia service. The patient was then placed in the dorsal lithotomy position and prepped and draped in the usual sterile fashion. A preoperative timeout was called. Meatal sounds were then used to dilate his meatus up to 28 [place]. A 24 French resectoscope with visual obturator was inserted into the patient's bladder per urethra atraumatically. Pan cystoscopy was unremarkable except for the  previously resected tumor site. This was just medial to the right ureteral orifice. This area was resected down to muscle. These specimens were sent to pathology. Hemostasis was obtained was excellent. The ureter orifice was intact at the procedure. A 20 French Foley catheter was then placed and subsequent dependent drainage. The patient was woke from anesthesia with no apparent consultations.  Plan: The patient will follow-up tomorrow for Foley catheter removal. He will follow-up in one to 2 weeks pathology results. I will get a renal ultrasound approximately one month due to the proximity of our resection to the ureteral orifice to rule out silent hydronephrosis.  Baruch Gouty, M.D.

## 2015-07-14 ENCOUNTER — Ambulatory Visit: Payer: Medicare Other

## 2015-07-14 ENCOUNTER — Encounter: Payer: Self-pay | Admitting: Obstetrics and Gynecology

## 2015-07-14 ENCOUNTER — Ambulatory Visit (INDEPENDENT_AMBULATORY_CARE_PROVIDER_SITE_OTHER): Payer: Medicare Other | Admitting: Obstetrics and Gynecology

## 2015-07-14 VITALS — BP 143/58 | HR 79 | Ht 69.0 in | Wt 210.0 lb

## 2015-07-14 DIAGNOSIS — C679 Malignant neoplasm of bladder, unspecified: Secondary | ICD-10-CM | POA: Diagnosis not present

## 2015-07-14 NOTE — Anesthesia Postprocedure Evaluation (Signed)
Anesthesia Post Note  Patient: Joseph Graham  Procedure(s) Performed: Procedure(s) (LRB): TRANSURETHRAL RESECTION OF BLADDER TUMOR (TURBT) (N/A)  Patient location during evaluation: PACU Anesthesia Type: General Level of consciousness: awake and alert Pain management: pain level controlled Vital Signs Assessment: post-procedure vital signs reviewed and stable Respiratory status: spontaneous breathing, nonlabored ventilation, respiratory function stable and patient connected to nasal cannula oxygen Cardiovascular status: blood pressure returned to baseline and stable Postop Assessment: no signs of nausea or vomiting Anesthetic complications: no    Last Vitals:  Filed Vitals:   07/13/15 1641 07/13/15 1658  BP: 139/67 131/61  Pulse: 69 62  Temp: 35.9 C   Resp: 16     Last Pain:  Filed Vitals:   07/14/15 1050  PainSc: 0-No pain                 Martha Clan

## 2015-07-14 NOTE — Progress Notes (Signed)
Fill and Pull Catheter Removal  Patient is present today for a catheter removal.  Patient was cleaned and prepped in a sterile fashion 74m of sterile water/ saline was instilled into the bladder when the patient felt the urge to urinate. 167mof water was then drained from the balloon.  A 20FR foley cath was removed from the bladder no complications were noted .  Patient as then given some time to void on their own.  Patient can void  3067mere in urinal and a good amount on the floor.  Patient tolerated well.  Preformed by: CheToniann FailPN  Follow up/ Additional notes: Reinforced with pt if not able to urinate or develops pain, n/v, f/c to call/come back to the office. Pt voiced understanding.

## 2015-07-14 NOTE — Progress Notes (Signed)
8:49 AM   Joseph Graham [birth year] [id number]  Referring provider: [name], MD [address] [place], [place] [address]  Chief Complaint  Patient presents with  . Other    post op voiding trial     HPI: The patient is a 75 year old gentleman who has both lung and bladder lesions. He had a 2-3 cm exophytic lesion in the midline posterior bladder wall superior to the trigone during office cystoscopy at his last visit. At that time, there was more concern about his lung lesion, so we planned to have his lung lesion addressed first with the TURBT to follow lung surgery . He had a lung biopsy which showed poorly differentiated carcinoma of unclear origin. There is concern it may be metastasis from the bladder, so he was sent back now for resection of his bladder tumor as well as to get a tissue specimen to determine the next step in his treatment. He has since undergone TURBT. His pathology shows noninvasive high-grade urothelial carcinoma. Is unclear TA or T1 disease based on the pathology report. There was no muscle in the specimen. It is a different carcinoma morphologically from his lung cancer. It is not clear at this time whether here not receiving chemotherapy as part of his undifferentiated carcinoma of the lung treatment.     The patient has done well since being seen in our clinic. He is undergoing radiation currently for his lung cancer. Is scheduled to be finished mid January 2017. He is not scheduled to receive chemotherapy at this time.   Interval History: Patient status post TURBT and meatal dilation. He presents today for removal of Foley catheter. He denies any significant gross hematuria. He is currently experiencing only minimal discomfort from his catheter. No other complaints. No fevers  PMH: Past Medical History  Diagnosis Date  . Colon polyps     next colonoscopy due around 2018  . HTN (hypertension)   . Alcohol abuse, in remission     remote  .  Benign essential tremor     improved on metoprolol and gabapentin  . Dyslipidemia     mild off meds  . Chronic low back pain     MRI 2004, multilevel DDD  . BPH (benign prostatic hypertrophy) 12/30/2012  . Personal history of tobacco use, presenting hazards to health 02/18/2015    quit 06/2011, restarted 2014  . Multiple pulmonary nodules 02/2015  . COPD (chronic obstructive pulmonary disease) ([place])   . GERD (gastroesophageal reflux disease)   . Arthritis     BACK AND NECK  . Shortness of breath dyspnea     OCC WITH EXERTION  . Cardiac arrhythmia 03/2010    h/o a flutter and CM, s/p ablation, normal stress test  . Primary cancer of bladder ([place]) 2016    Budzyn  . Primary lung cancer ([place]) 2016  . Complication of anesthesia     DID NOT GET COMPLETELY NUMB WITH TONSILLECTOMY    Surgical History: Past Surgical History  Procedure Laterality Date  . Hemorroidectomy  1982  . A flutter ablation  03/2010  . Cataract extraction    . Carotid US  03/2010    WNL  . Colonoscopy  09/2011    polyps, rpt due 5 yrs, mild diverticulosis [name])  . Tonsillectomy  1964  . Electromagnetic navigation brochoscopy N/A 03/02/2015    Procedure: ELECTROMAGNETIC NAVIGATION BRONCHOSCOPY;  Surgeon: [name], MD;  Location: [facility];  Service: Cardiopulmonary;  Laterality: N/A;  .  Transurethral resection of bladder tumor N/A 04/06/2015    Procedure: TRANSURETHRAL RESECTION OF BLADDER TUMOR (TURBT) right ureteral stent placement;  Surgeon: [name], MD;  Location: [facility];  Service: Urology;  Laterality: N/A;  . Cystoscopy w/ retrogrades Bilateral 04/06/2015    Procedure: CYSTOSCOPY WITH RETROGRADE PYELOGRAM;  Surgeon: [name], MD;  Location: [facility];  Service: Urology;  Laterality: Bilateral;  . Urinary stent removal  04/14/15    Home Medications:    Medication List       This list is accurate as of: 07/14/15  8:49 AM.  Always use your most recent med list.                aspirin EC 81 MG tablet  Take 81 mg by mouth daily.     cephALEXin 500 MG capsule  Commonly known as:  KEFLEX  Take 1 capsule (500 mg total) by mouth 3 (three) times daily.     famotidine 10 MG tablet  Commonly known as:  PEPCID  Take 10 mg by mouth 2 (two) times daily.     gabapentin 100 MG capsule  Commonly known as:  NEURONTIN  TAKE 1 CAPSULE BY MOUTH TWICE A DAY AS NEEDED     HYDROcodone-acetaminophen 5-325 MG tablet  Commonly known as:  NORCO  Take 1 tablet by mouth every 6 (six) hours as needed for moderate pain.     lisinopril 10 MG tablet  Commonly known as:  PRINIVIL,ZESTRIL  Take 1 tablet (10 mg total) by mouth daily.     metoprolol tartrate 25 MG tablet  Commonly known as:  LOPRESSOR  Take 0.5 tablets (12.5 mg total) by mouth 2 (two) times daily.     naproxen sodium 220 MG tablet  Commonly known as:  ANAPROX  Take 220 mg by mouth 2 (two) times daily with a meal.     rosuvastatin 5 MG tablet  Commonly known as:  CRESTOR  Take 1 tablet (5 mg total) by mouth daily.        Allergies: No Known Allergies  Family History: Family History  Problem Relation Age of Onset  . Cervical cancer Mother 86  . Hypertension Father   . Stroke Brother 68  . Tremor Father     and several uncles/aunts (not parkinson's)  . Bladder Cancer Brother 47  . Prostate cancer Neg Hx   . Kidney cancer Neg Hx     Social History:  reports that he quit smoking about 4 months ago. His smoking use included Cigarettes. He has a 91.5 pack-year smoking history. He has never used smokeless tobacco. He reports that he does not drink alcohol or use illicit drugs.  ROS: UROLOGY Frequent Urination?: No Hard to postpone urination?: No Burning/pain with urination?: No Get up at night to urinate?: No Leakage of urine?: No Urine stream starts and stops?: No Trouble starting stream?: No Do you have to strain to urinate?: No Blood in urine?: No Urinary tract infection?: No Sexually  transmitted disease?: No Injury to kidneys or bladder?: No Painful intercourse?: No Weak stream?: No Erection problems?: No Penile pain?: No  Gastrointestinal Nausea?: No Vomiting?: No Indigestion/heartburn?: No Diarrhea?: No Constipation?: No  Constitutional Fever: No Night sweats?: No Weight loss?: No Fatigue?: No  Skin Skin rash/lesions?: No Itching?: No  Eyes Blurred vision?: No Double vision?: No  Ears/Nose/Throat Sore throat?: No Sinus problems?: No  Hematologic/Lymphatic Swollen glands?: No Easy bruising?: No  Cardiovascular Leg swelling?: No Chest pain?: No  Respiratory Cough?: No Shortness of breath?: No  Endocrine Excessive thirst?: No  Musculoskeletal Back pain?: No Joint pain?: No  Neurological Headaches?: No Dizziness?: No  Psychologic Depression?: No Anxiety?: No  Physical Exam: BP 143/58 mmHg  Pulse 79  Ht '5\' 9"'$  (1.753 m)  Wt 210 lb (95.255 kg)  BMI 31.00 kg/m2  Constitutional:  Alert and oriented, No acute distress. HEENT: [place] AT, moist mucus membranes.  Trachea midline, no masses. Cardiovascular: No clubbing, cyanosis, or edema. Respiratory: Normal respiratory effort, no increased work of breathing. GU: Foley catheter present in urinary meatus Skin: No rashes, bruises or suspicious lesions. Lymph: No cervical or inguinal adenopathy. Neurologic: Grossly intact, no focal deficits, moving all 4 extremities. Psychiatric: Normal mood and affect.  Laboratory Data: Lab Results  Component Value Date   WBC 5.6 06/30/2015   HGB 13.7 06/30/2015   HCT 41.4 06/30/2015   MCV 94.7 06/30/2015   PLT 197 06/30/2015    Lab Results  Component Value Date   CREATININE 0.86 06/30/2015    Lab Results  Component Value Date   PSA 0.60 01/24/2015   PSA 0.68 12/31/2013   PSA 0.67 12/30/2012    No results found for: TESTOSTERONE  Lab Results  Component Value Date   HGBA1C 6.3 01/24/2015    Urinalysis    Component Value  Date/Time   GLUCOSEU Negative 04/14/2015 0822   BILIRUBINUR Negative 04/14/2015 0822   NITRITE Negative 04/14/2015 0822   LEUKOCYTESUR 1+* 04/14/2015 0822    Assessment & Plan:    1.  High grade noninvasive TCC of bladder Patient doing well without complaints. - S/p recent TURBT.  Foley catheter removed today.  Follow up as scheduled for pathology review.   Herbert Moors, [facility] [address] [place], [place] [address] [phone]

## 2015-07-15 LAB — SURGICAL PATHOLOGY

## 2015-07-18 ENCOUNTER — Inpatient Hospital Stay (HOSPITAL_BASED_OUTPATIENT_CLINIC_OR_DEPARTMENT_OTHER): Payer: Medicare Other | Admitting: Internal Medicine

## 2015-07-18 ENCOUNTER — Inpatient Hospital Stay: Payer: Medicare Other | Attending: Internal Medicine

## 2015-07-18 VITALS — BP 164/76 | HR 72 | Temp 97.7°F | Resp 18 | Ht 69.0 in | Wt 215.4 lb

## 2015-07-18 DIAGNOSIS — I10 Essential (primary) hypertension: Secondary | ICD-10-CM | POA: Diagnosis not present

## 2015-07-18 DIAGNOSIS — Z8042 Family history of malignant neoplasm of prostate: Secondary | ICD-10-CM | POA: Diagnosis not present

## 2015-07-18 DIAGNOSIS — Z923 Personal history of irradiation: Secondary | ICD-10-CM | POA: Insufficient documentation

## 2015-07-18 DIAGNOSIS — Z8551 Personal history of malignant neoplasm of bladder: Secondary | ICD-10-CM | POA: Diagnosis not present

## 2015-07-18 DIAGNOSIS — N400 Enlarged prostate without lower urinary tract symptoms: Secondary | ICD-10-CM | POA: Diagnosis not present

## 2015-07-18 DIAGNOSIS — Z86010 Personal history of colonic polyps: Secondary | ICD-10-CM | POA: Insufficient documentation

## 2015-07-18 DIAGNOSIS — J449 Chronic obstructive pulmonary disease, unspecified: Secondary | ICD-10-CM | POA: Insufficient documentation

## 2015-07-18 DIAGNOSIS — E785 Hyperlipidemia, unspecified: Secondary | ICD-10-CM | POA: Diagnosis not present

## 2015-07-18 DIAGNOSIS — Z79899 Other long term (current) drug therapy: Secondary | ICD-10-CM | POA: Diagnosis not present

## 2015-07-18 DIAGNOSIS — Z8052 Family history of malignant neoplasm of bladder: Secondary | ICD-10-CM | POA: Diagnosis not present

## 2015-07-18 DIAGNOSIS — G250 Essential tremor: Secondary | ICD-10-CM | POA: Insufficient documentation

## 2015-07-18 DIAGNOSIS — Z808 Family history of malignant neoplasm of other organs or systems: Secondary | ICD-10-CM

## 2015-07-18 DIAGNOSIS — K219 Gastro-esophageal reflux disease without esophagitis: Secondary | ICD-10-CM | POA: Diagnosis not present

## 2015-07-18 DIAGNOSIS — M129 Arthropathy, unspecified: Secondary | ICD-10-CM | POA: Diagnosis not present

## 2015-07-18 DIAGNOSIS — I499 Cardiac arrhythmia, unspecified: Secondary | ICD-10-CM | POA: Diagnosis not present

## 2015-07-18 DIAGNOSIS — Z8051 Family history of malignant neoplasm of kidney: Secondary | ICD-10-CM

## 2015-07-18 DIAGNOSIS — C679 Malignant neoplasm of bladder, unspecified: Secondary | ICD-10-CM

## 2015-07-18 DIAGNOSIS — F17210 Nicotine dependence, cigarettes, uncomplicated: Secondary | ICD-10-CM | POA: Diagnosis not present

## 2015-07-18 DIAGNOSIS — Z7982 Long term (current) use of aspirin: Secondary | ICD-10-CM

## 2015-07-18 DIAGNOSIS — C3411 Malignant neoplasm of upper lobe, right bronchus or lung: Secondary | ICD-10-CM

## 2015-07-18 DIAGNOSIS — G8929 Other chronic pain: Secondary | ICD-10-CM | POA: Insufficient documentation

## 2015-07-18 DIAGNOSIS — M545 Low back pain: Secondary | ICD-10-CM | POA: Insufficient documentation

## 2015-07-18 LAB — COMPREHENSIVE METABOLIC PANEL
ALT: 28 U/L (ref 17–63)
AST: 25 U/L (ref 15–41)
Albumin: 4.1 g/dL (ref 3.5–5.0)
Alkaline Phosphatase: 99 U/L (ref 38–126)
Anion gap: 4 — ABNORMAL LOW (ref 5–15)
BILIRUBIN TOTAL: 0.4 mg/dL (ref 0.3–1.2)
BUN: 27 mg/dL — AB (ref 6–20)
CHLORIDE: 101 mmol/L (ref 101–111)
CO2: 30 mmol/L (ref 22–32)
CREATININE: 0.92 mg/dL (ref 0.61–1.24)
Calcium: 8.9 mg/dL (ref 8.9–10.3)
GFR calc Af Amer: >60 mL/min (ref >60)
Glucose, Bld: 165 mg/dL — ABNORMAL HIGH (ref 65–99)
Potassium: 4 mmol/L (ref 3.5–5.1)
Sodium: 135 mmol/L (ref 135–145)
TOTAL PROTEIN: 7.8 g/dL (ref 6.5–8.1)

## 2015-07-18 LAB — CBC WITH DIFFERENTIAL/PLATELET
BASOS ABS: 0 10*3/uL (ref 0–0.1)
Basophils Relative: 1 %
Eosinophils Absolute: 0.3 10*3/uL (ref 0–0.7)
Eosinophils Relative: 5 %
HEMATOCRIT: 41.1 % (ref 40.0–52.0)
Hemoglobin: 14 g/dL (ref 13.0–18.0)
LYMPHS PCT: 17 %
Lymphs Abs: 1 10*3/uL (ref 1.0–3.6)
MCH: 32.3 pg (ref 26.0–34.0)
MCHC: 34 g/dL (ref 32.0–36.0)
MCV: 95 fL (ref 80.0–100.0)
Monocytes Absolute: 0.6 10*3/uL (ref 0.2–1.0)
Monocytes Relative: 11 %
NEUTROS ABS: 4 10*3/uL (ref 1.4–6.5)
Neutrophils Relative %: 66 %
Platelets: 219 10*3/uL (ref 150–440)
RBC: 4.32 MIL/uL — AB (ref 4.40–5.90)
RDW: 13.9 % (ref 11.5–14.5)
WBC: 6 10*3/uL (ref 3.8–10.6)

## 2015-07-18 NOTE — Progress Notes (Signed)
Pt has stopped smoking. He is not currently exercising but states that he is eager to resume normal activities.  He recently underwent a cystoscopy/Turp on 07/13/15 and foley catheter was removed at urology office last week.  Today, Order sent to pathology for additional add on pathology testing for pdl-1 testing, egft/emla4/alk and ros1

## 2015-07-18 NOTE — Progress Notes (Signed)
[name] NOTE  Patient Care Team: [name], MD as PCP - General (Family Medicine)  CHIEF COMPLAINTS/PURPOSE OF CONSULTATION:   # OCT 2016- RUL POORLY DIFF CA [clinically non-small cell] ; STAGE IIB [T3-2 separate nodules in RUL; N0] [s/p CT guided bx]- s/p RT [finished mid jan 2017]  # OCT 2016- 2 sub cm nodules in Right LL-   # OCT 2016-NON-invasive bladder urothelial cancer; high grade [Dr. Budzyn]; s/p TURBT [Feb 2017- NEG]  # Pneumothorax [oct 2016 s/p CT Bx]   HISTORY OF PRESENTING ILLNESS:  Joseph Graham 75 y.o. male with a history of long-standing smoking/COPD-  Non-small cell/ poorly differentiated malignancy of the right upper lobe  T3 N0 status post radiation is here for follow-up.   In the interim he also underwent repeat-  TURBT biopsy negative for any malignancy.  Is awaiting BCG through neurology in the next 6 weeks.   Patient finished  His radiation in middle of January 2017.  He tolerated fairly well.  He  Denies any unusual cough or shortness of breath or chest pain.  His appetite is good. No weight loss. Gained weight.   ROS: A complete 10 point review of system is done which is negative for mentioned above in history of present illness.    MEDICAL HISTORY:  Past Medical History  Diagnosis Date  . Colon polyps     next colonoscopy due around 2018  . HTN (hypertension)   . Alcohol abuse, in remission     remote  . Benign essential tremor     improved on metoprolol and gabapentin  . Dyslipidemia     mild off meds  . Chronic low back pain     MRI 2004, multilevel DDD  . BPH (benign prostatic hypertrophy) 12/30/2012  . Personal history of tobacco use, presenting hazards to health 02/18/2015    quit 06/2011, restarted 2014  . Multiple pulmonary nodules 02/2015  . COPD (chronic obstructive pulmonary disease) ([place])   . GERD (gastroesophageal reflux disease)   . Arthritis     BACK AND NECK  . Shortness of breath dyspnea    OCC WITH EXERTION  . Cardiac arrhythmia 03/2010    h/o a flutter and CM, s/p ablation, normal stress test  . Primary cancer of bladder ([place]) 2016    Budzyn  . Primary lung cancer ([place]) 2016  . Complication of anesthesia     DID NOT GET COMPLETELY NUMB WITH TONSILLECTOMY    SURGICAL HISTORY: Past Surgical History  Procedure Laterality Date  . Hemorroidectomy  1982  . A flutter ablation  03/2010  . Cataract extraction    . Carotid US  03/2010    WNL  . Colonoscopy  09/2011    polyps, rpt due 5 yrs, mild diverticulosis [name])  . Tonsillectomy  1964  . Electromagnetic navigation brochoscopy N/A 03/02/2015    Procedure: ELECTROMAGNETIC NAVIGATION BRONCHOSCOPY;  Surgeon: [name], MD;  Location: [facility];  Service: Cardiopulmonary;  Laterality: N/A;  . Transurethral resection of bladder tumor N/A 04/06/2015    Procedure: TRANSURETHRAL RESECTION OF BLADDER TUMOR (TURBT) right ureteral stent placement;  Surgeon: [name], MD;  Location: [facility];  Service: Urology;  Laterality: N/A;  . Cystoscopy w/ retrogrades Bilateral 04/06/2015    Procedure: CYSTOSCOPY WITH RETROGRADE PYELOGRAM;  Surgeon: [name], MD;  Location: [facility];  Service: Urology;  Laterality: Bilateral;  . Urinary stent removal  04/14/15  . Transurethral resection of bladder tumor N/A 07/13/2015  Procedure: TRANSURETHRAL RESECTION OF BLADDER TUMOR (TURBT);  Surgeon: [name], MD;  Location: [facility];  Service: Urology;  Laterality: N/A;    SOCIAL HISTORY: Patient is retired from Press [employer]. He lives in [place]. Social History   Social History  . Marital Status: Married    Spouse Name: N/A  . Number of Children: N/A  . Years of Education: N/A   Occupational History  . Not on file.   Social History Main Topics  . Smoking status: Former Smoker -- 1.50 packs/day for 61 years    Types: Cigarettes    Quit date: 03/08/2015  . Smokeless tobacco: Never Used     Comment: cut back 0.5  cigarettes--stopped 03/07/15; now now chantix  . Alcohol Use: No     Comment: has not drank in 37 years  . Drug Use: No  . Sexual Activity: Not on file   Other Topics Concern  . Not on file   Social History Narrative   Caffeine: 1 cup decaf/day   Lives with wife   Occupation: retired, worked Press photographer   Activity: gym 3x/wk, Immunologist, hobbies (paint, building things)   Diet: healthy, good water, fruits/vegetables daily, red meat 1x/wk    FAMILY HISTORY: Family History  Problem Relation Age of Onset  . Cervical cancer Mother 5  . Hypertension Father   . Stroke Brother 85  . Tremor Father     and several uncles/aunts (not parkinson's)  . Bladder Cancer Brother 13  . Prostate cancer Neg Hx   . Kidney cancer Neg Hx     ALLERGIES:  has No Known Allergies.  MEDICATIONS:  Current Outpatient Prescriptions  Medication Sig Dispense Refill  . aspirin EC 81 MG tablet Take 81 mg by mouth daily.    . cephALEXin (KEFLEX) 500 MG capsule Take 1 capsule (500 mg total) by mouth 3 (three) times daily. 6 capsule 0  . famotidine (PEPCID) 10 MG tablet Take 10 mg by mouth 2 (two) times daily.     Marland Kitchen gabapentin (NEURONTIN) 100 MG capsule TAKE 1 CAPSULE BY MOUTH TWICE A DAY AS NEEDED (Patient taking differently: Take 100 mg by mouth 2 (two) times daily. TAKE 1 CAPSULE BY MOUTH TWICE A DAY) 180 capsule 3  . HYDROcodone-acetaminophen (NORCO) 5-325 MG tablet Take 1 tablet by mouth every 6 (six) hours as needed for moderate pain. 30 tablet 0  . lisinopril (PRINIVIL,ZESTRIL) 10 MG tablet Take 1 tablet (10 mg total) by mouth daily. (Patient taking differently: Take 10 mg by mouth every evening. ) 90 tablet 3  . metoprolol tartrate (LOPRESSOR) 25 MG tablet Take 0.5 tablets (12.5 mg total) by mouth 2 (two) times daily. 90 tablet 3  . naproxen sodium (ANAPROX) 220 MG tablet Take 220 mg by mouth 2 (two) times daily with a meal.    . rosuvastatin (CRESTOR) 5 MG tablet Take 1 tablet (5 mg total) by mouth daily.  (Patient taking differently: Take 5 mg by mouth. 2-3 times weekly for maintenance) 90 tablet 3   No current facility-administered medications for this visit.  Marland Kitchen  PHYSICAL EXAMINATION: ECOG PERFORMANCE STATUS: 0 - Asymptomatic  Filed Vitals:   07/18/15 1047  BP: 164/76  Pulse: 72  Temp: 97.7 F (36.5 C)  Resp: 18   Filed Weights   07/18/15 1047  Weight: 215 lb 6.2 oz (97.7 kg)    GENERAL:alert, no distress and comfortable. He is accompanied by his wife.  SKIN: skin color, texture, turgor are normal, no rashes or significant  lesions EYES: normal, conjunctiva are pink and non-injected, sclera clear OROPHARYNX:no exudate, no erythema and lips, buccal mucosa, and tongue normal  NECK: supple, thyroid normal size, non-tender, without nodularity LYMPH:  no palpable lymphadenopathy in the cervical, axillary or inguinal LUNGS: clear to auscultation and percussion with normal breathing effort HEART: regular rate & rhythm and no murmurs and no lower extremity edema ABDOMEN:abdomen soft, non-tender and normal bowel sounds Musculoskeletal:no cyanosis of digits and no clubbing  PSYCH: alert & oriented x 3 with fluent speech NEURO: no focal motor/sensory deficits  LABORATORY DATA:  I have reviewed the data as listed Lab Results  Component Value Date   WBC 6.0 07/18/2015   HGB 14.0 07/18/2015   HCT 41.1 07/18/2015   MCV 95.0 07/18/2015   PLT 219 07/18/2015    Recent Labs  03/22/15 0504 06/30/15 1006 07/18/15 1007  NA 137 139 135  K 4.5 3.9 4.0  CL 101 102 101  CO2 '28 30 30  '$ GLUCOSE 106* 120* 165*  BUN 33* 25* 27*  CREATININE 0.93 0.86 0.92  CALCIUM 9.0 9.0 8.9  GFRNONAA >60 >60 >60  GFRAA >60 >60 >60  PROT 7.5  --  7.8  ALBUMIN 4.2  --  4.1  AST 29  --  25  ALT 29  --  28  ALKPHOS 80  --  99  BILITOT 0.6  --  0.4    RADIOGRAPHIC STUDIES:  ASSESSMENT & PLAN:   # Right UL POORLY DIFF CA- T3 N0- proceed with radiation therapy.  Currently status post radiation-   [facility] January 2017.  Plan to get a CT scan of the chest now.  # Subcentimeter right lower lobe lung nodules- question etiology/too small for the threshold of PET scan or biopsy.  Await above CT scan.  #  Based upon the results of the above CAT scan-  Adjuvant chemotherapy carbotaxol 4 could be recommended  Versus immunotherapy.  Check for PDL 1- status.   # Noninvasive high-grade bladder cancer-  Most recent cystoscopy negative.  Plan for BCG as per patient in the next 6 weeks.  We will check with urology prior to starting any therapy.   # 25 minutes face-to-face with the patient discussing the above plan of care; more than 50% of time spent on prognosis/ natural history; counseling and coordination.    Cammie Sickle, MD 07/18/2015 11:13 AM

## 2015-07-26 ENCOUNTER — Ambulatory Visit
Admission: RE | Admit: 2015-07-26 | Discharge: 2015-07-26 | Disposition: A | Discharge status: Home / Self Care | Payer: Medicare Other | Source: Ambulatory Visit | Attending: Internal Medicine | Admitting: Internal Medicine

## 2015-07-26 ENCOUNTER — Encounter: Payer: Self-pay | Admitting: Internal Medicine

## 2015-07-26 ENCOUNTER — Ambulatory Visit (INDEPENDENT_AMBULATORY_CARE_PROVIDER_SITE_OTHER): Payer: Medicare Other | Admitting: Internal Medicine

## 2015-07-26 ENCOUNTER — Ambulatory Visit: Admission: RE | Admit: 2015-07-26 | Payer: Medicare Other | Source: Ambulatory Visit

## 2015-07-26 VITALS — BP 150/78 | HR 81 | Ht 69.0 in | Wt 216.8 lb

## 2015-07-26 DIAGNOSIS — J449 Chronic obstructive pulmonary disease, unspecified: Secondary | ICD-10-CM

## 2015-07-26 DIAGNOSIS — C3411 Malignant neoplasm of upper lobe, right bronchus or lung: Secondary | ICD-10-CM | POA: Insufficient documentation

## 2015-07-26 DIAGNOSIS — J439 Emphysema, unspecified: Secondary | ICD-10-CM | POA: Diagnosis not present

## 2015-07-26 MED ORDER — FLUTICASONE FUROATE-VILANTEROL 100-25 MCG/INH IN AEPB: 1.0000 | INHALATION_SPRAY | Freq: Every day | RESPIRATORY_TRACT | Status: DC

## 2015-07-26 MED ORDER — IOHEXOL 300 MG/ML  SOLN
75.0000 mL | Freq: Once | INTRAMUSCULAR | Status: AC | PRN
  Administered 2015-07-26: 75 mL via INTRAVENOUS

## 2015-07-26 MED ORDER — ALBUTEROL SULFATE HFA 108 (90 BASE) MCG/ACT IN AERS: 2.0000 | INHALATION_SPRAY | Freq: Four times a day (QID) | RESPIRATORY_TRACT | Status: DC | PRN

## 2015-07-26 MED ORDER — UMECLIDINIUM BROMIDE 62.5 MCG/INH IN AEPB: 1.0000 | INHALATION_SPRAY | Freq: Every day | RESPIRATORY_TRACT | Status: DC

## 2015-07-26 NOTE — Progress Notes (Deleted)
ROS  Physical Exam     

## 2015-07-26 NOTE — Progress Notes (Signed)
[facility] Consultation      Date: 07/26/2015,   MRN# [id number] Joseph Graham 1940-08-08 Code Status:  Code Status History    Date Active Date Inactive Code Status Order ID Comments User Context   03/21/2015  2:35 PM 03/26/2015  4:51 PM Full Code [id number]  [name], MD [place]   03/21/2015  1:32 PM 03/21/2015  2:26 PM Full Code [id number]  Sabino Dick, MD [facility] day:'@LENGTHOFSTAYDAYS'$ @ Referring MD: '@ATDPROV'$ @     PCP:      AdmissionWeight: 216 lb 12.8 oz (98.34 kg)                 CurrentWeight: 216 lb 12.8 oz (98.34 kg) Joseph Graham is a 75 y.o. old male seen in consultation for SOB,DOE at the request of Dr. Donella Stade.     CHIEF COMPLAINT:   SOB,DOE   HISTORY OF PRESENT ILLNESS   75 yo white male seen today for SOB,DOE for many years, during  exercise  his SOB resolves with taking deep breaths and time. This has been going on for over several months Patient quit tobacco abuse when he was recently dx with Rul LUng cancer Stage 2B , he had smoked for 60 years 1 PPD In 102016 CT guided BX with acute PTX s/p Chest tube placement dx with lung cancer  Patient has SOB adn DOE with exertion, no cough, no wheezing. No signs of infection at this time Patient states that he has gained 20 lbs in past several months Patient has lower ext swelling as well, for approx several months, patient has h/o cardiac ablation  Patient has been getting Radiation therapy approx 35 sessions-repeat CT chest pending in next 24 hrs Patient recently has seen Oncology-not on chemo yet  Patient has had PFT approx 5 months ago Ratio 60% FEV1 39% RV 146% patinet  patinet PAST MEDICAL HISTORY   Past Medical History  Diagnosis Date  . Colon polyps     next colonoscopy due around 2018  . HTN (hypertension)   . Alcohol abuse, in remission     remote  . Benign essential tremor     improved on metoprolol and gabapentin  . Dyslipidemia     mild off meds  . Chronic low  back pain     MRI 2004, multilevel DDD  . BPH (benign prostatic hypertrophy) 12/30/2012  . Personal history of tobacco use, presenting hazards to health 02/18/2015    quit 06/2011, restarted 2014  . Multiple pulmonary nodules 02/2015  . COPD (chronic obstructive pulmonary disease) ([place])   . GERD (gastroesophageal reflux disease)   . Arthritis     BACK AND NECK  . Shortness of breath dyspnea     OCC WITH EXERTION  . Cardiac arrhythmia 03/2010    h/o a flutter and CM, s/p ablation, normal stress test  . Primary cancer of bladder ([place]) 2016    Budzyn  . Primary lung cancer ([place]) 2016  . Complication of anesthesia     DID NOT GET COMPLETELY NUMB WITH TONSILLECTOMY     SURGICAL HISTORY   Past Surgical History  Procedure Laterality Date  . Hemorroidectomy  1982  . A flutter ablation  03/2010  . Cataract extraction    . Carotid US  03/2010    WNL  . Colonoscopy  09/2011    polyps, rpt due 5 yrs, mild diverticulosis [name])  . Tonsillectomy  1964  . Electromagnetic navigation brochoscopy N/A 03/02/2015  Procedure: ELECTROMAGNETIC NAVIGATION BRONCHOSCOPY;  Surgeon: [name], MD;  Location: [facility] ORS;  Service: Cardiopulmonary;  Laterality: N/A;  . Transurethral resection of bladder tumor N/A 04/06/2015    Procedure: TRANSURETHRAL RESECTION OF BLADDER TUMOR (TURBT) right ureteral stent placement;  Surgeon: [name], MD;  Location: [facility] ORS;  Service: Urology;  Laterality: N/A;  . Cystoscopy w/ retrogrades Bilateral 04/06/2015    Procedure: CYSTOSCOPY WITH RETROGRADE PYELOGRAM;  Surgeon: [name], MD;  Location: [facility] ORS;  Service: Urology;  Laterality: Bilateral;  . Urinary stent removal  04/14/15  . Transurethral resection of bladder tumor N/A 07/13/2015    Procedure: TRANSURETHRAL RESECTION OF BLADDER TUMOR (TURBT);  Surgeon: [name], MD;  Location: [facility] ORS;  Service: Urology;  Laterality: N/A;     FAMILY HISTORY   Family History  Problem  Relation Age of Onset  . Cervical cancer Mother 46  . Hypertension Father   . Stroke Brother 6  . Tremor Father     and several uncles/aunts (not parkinson's)  . Bladder Cancer Brother 49  . Prostate cancer Neg Hx   . Kidney cancer Neg Hx      SOCIAL HISTORY   Social History  Substance Use Topics  . Smoking status: Former Smoker -- 1.50 packs/day for 61 years    Types: Cigarettes    Quit date: 03/08/2015  . Smokeless tobacco: Never Used     Comment: cut back 0.5 cigarettes--stopped 03/07/15; now now chantix  . Alcohol Use: No     Comment: has not drank in 37 years     MEDICATIONS    Home Medication:  Current Outpatient Rx  Name  Route  Sig  Dispense  Refill  . aspirin EC 81 MG tablet   Oral   Take 81 mg by mouth daily.         . famotidine (PEPCID) 10 MG tablet   Oral   Take 10 mg by mouth 2 (two) times daily.          Marland Kitchen gabapentin (NEURONTIN) 100 MG capsule      TAKE 1 CAPSULE BY MOUTH TWICE A DAY AS NEEDED Patient taking differently: Take 100 mg by mouth 2 (two) times daily. TAKE 1 CAPSULE BY MOUTH TWICE A DAY   180 capsule   3   . lisinopril (PRINIVIL,ZESTRIL) 10 MG tablet   Oral   Take 1 tablet (10 mg total) by mouth daily. Patient taking differently: Take 10 mg by mouth every evening.    90 tablet   3   . metoprolol tartrate (LOPRESSOR) 25 MG tablet   Oral   Take 0.5 tablets (12.5 mg total) by mouth 2 (two) times daily.   90 tablet   3   . albuterol (PROVENTIL HFA;VENTOLIN HFA) 108 (90 Base) MCG/ACT inhaler   Inhalation   Inhale 2 puffs into the lungs every 6 (six) hours as needed for wheezing or shortness of breath.   1 Inhaler   2   . fluticasone furoate-vilanterol (BREO ELLIPTA) 100-25 MCG/INH AEPB   Inhalation   Inhale 1 puff into the lungs daily.   60 each   5   . fluticasone furoate-vilanterol (BREO ELLIPTA) 100-25 MCG/INH AEPB   Inhalation   Inhale 1 puff into the lungs daily.   14 each   0   . umeclidinium bromide (INCRUSE  ELLIPTA) 62.5 MCG/INH AEPB   Inhalation   Inhale 1 puff into the lungs daily.   30 each   5   .  umeclidinium bromide (INCRUSE ELLIPTA) 62.5 MCG/INH AEPB   Inhalation   Inhale 1 puff into the lungs daily.   7 each   0     Current Medication:  Current outpatient prescriptions:  .  aspirin EC 81 MG tablet, Take 81 mg by mouth daily., Disp: , Rfl:  .  famotidine (PEPCID) 10 MG tablet, Take 10 mg by mouth 2 (two) times daily. , Disp: , Rfl:  .  gabapentin (NEURONTIN) 100 MG capsule, TAKE 1 CAPSULE BY MOUTH TWICE A DAY AS NEEDED (Patient taking differently: Take 100 mg by mouth 2 (two) times daily. TAKE 1 CAPSULE BY MOUTH TWICE A DAY), Disp: 180 capsule, Rfl: 3 .  lisinopril (PRINIVIL,ZESTRIL) 10 MG tablet, Take 1 tablet (10 mg total) by mouth daily. (Patient taking differently: Take 10 mg by mouth every evening. ), Disp: 90 tablet, Rfl: 3 .  metoprolol tartrate (LOPRESSOR) 25 MG tablet, Take 0.5 tablets (12.5 mg total) by mouth 2 (two) times daily., Disp: 90 tablet, Rfl: 3 .  albuterol (PROVENTIL HFA;VENTOLIN HFA) 108 (90 Base) MCG/ACT inhaler, Inhale 2 puffs into the lungs every 6 (six) hours as needed for wheezing or shortness of breath., Disp: 1 Inhaler, Rfl: 2 .  fluticasone furoate-vilanterol (BREO ELLIPTA) 100-25 MCG/INH AEPB, Inhale 1 puff into the lungs daily., Disp: 60 each, Rfl: 5 .  fluticasone furoate-vilanterol (BREO ELLIPTA) 100-25 MCG/INH AEPB, Inhale 1 puff into the lungs daily., Disp: 14 each, Rfl: 0 .  umeclidinium bromide (INCRUSE ELLIPTA) 62.5 MCG/INH AEPB, Inhale 1 puff into the lungs daily., Disp: 30 each, Rfl: 5 .  umeclidinium bromide (INCRUSE ELLIPTA) 62.5 MCG/INH AEPB, Inhale 1 puff into the lungs daily., Disp: 7 each, Rfl: 0    ALLERGIES   Review of patient's allergies indicates no known allergies.     REVIEW OF SYSTEMS   Review of Systems  Constitutional: Negative for fever, chills, weight loss and malaise/fatigue.  HENT: Negative for congestion and  hearing loss.   Eyes: Negative for blurred vision and double vision.  Respiratory: Positive for shortness of breath and wheezing. Negative for cough, hemoptysis and sputum production.   Cardiovascular: Positive for leg swelling. Negative for chest pain, palpitations and orthopnea.  Gastrointestinal: Negative for heartburn, nausea, vomiting, abdominal pain, diarrhea, constipation and blood in stool.  Genitourinary: Negative for dysuria and urgency.  Musculoskeletal: Negative for myalgias and neck pain.  Skin: Negative for rash.  Neurological: Negative for dizziness and headaches.  Endo/Heme/Allergies: Does not bruise/bleed easily.  Psychiatric/Behavioral: Negative for depression. The patient is not nervous/anxious.   All other systems reviewed and are negative.    VS: BP 150/78 mmHg  Pulse 81  Ht '5\' 9"'$  (1.753 m)  Wt 216 lb 12.8 oz (98.34 kg)  BMI 32.00 kg/m2  SpO2 90%     PHYSICAL EXAM  Physical Exam  Constitutional: He is oriented to person, place, and time. He appears well-developed and well-nourished. No distress.  HENT:  Head: Normocephalic and atraumatic.  Mouth/Throat: No oropharyngeal exudate.  Eyes: EOM are normal. Pupils are equal, round, and reactive to light. No scleral icterus.  Neck: Normal range of motion. Neck supple.  Cardiovascular: Normal rate, regular rhythm and normal heart sounds.   No murmur heard. Pulmonary/Chest: No stridor. No respiratory distress. He has no wheezes. He has no rales.  Abdominal: Soft. Bowel sounds are normal. There is no tenderness.  Musculoskeletal: Normal range of motion. He exhibits edema.  Neurological: He is alert and oriented to person, place, and time. Coordination normal.  Skin: Skin is warm. He is not diaphoretic.  Psychiatric: He has a normal mood and affect.         CT chest on 9/916 images reviewed 07/26/2015  CT chest     ASSESSMENT/PLAN   75 yo white male with chronic SOB and DOE with PFT's to suggestive of  severe COPD with recent dx of  Stage 2B poorly diff lung carcinoma(suggets no small cell) s/p RXT Current PFT shows moderate to severe obstructive airways disease with evidence of air trapping  Patient will need aggressive BD therapy and will need follow up PFT's  1.start BREO(LABA and Inhaled Steroids) 2.start INcruse(AC) 3.albuterol as needed 4.check overnight pulse ox 4.repeat PFT's in 1 month  Will re-assess Resp status in 1 month   I have personally obtained a history, examined the patient, evaluated laboratory and independently reviewed imaging results, formulated the assessment and plan and placed orders.  The Patient requires high complexity decision making for assessment and support, frequent evaluation and titration of therapies, application of advanced monitoring technologies and extensive interpretation of multiple databases.    Patient/Family are satisfied with Plan of action and management. All questions answered  Corrin Parker, M.D.  [facility]  Medical Director [place] Director [facility] Cardio-Pulmonary Department

## 2015-07-26 NOTE — Patient Instructions (Signed)
Chronic Obstructive Pulmonary Disease Chronic obstructive pulmonary disease (COPD) is a common lung condition in which airflow from the lungs is limited. COPD is a general term that can be used to describe many different lung problems that limit airflow, including both chronic bronchitis and emphysema. If you have COPD, your lung function will probably never return to normal, but there are measures you can take to improve lung function and make yourself feel better. CAUSES   Smoking (common).  Exposure to secondhand smoke.  Genetic problems.  Chronic inflammatory lung diseases or recurrent infections. SYMPTOMS  Shortness of breath, especially with physical activity.  Deep, persistent (chronic) cough with a large amount of thick mucus.  Wheezing.  Rapid breaths (tachypnea).  Gray or bluish discoloration (cyanosis) of the skin, especially in your fingers, toes, or lips.  Fatigue.  Weight loss.  Frequent infections or episodes when breathing symptoms become much worse (exacerbations).  Chest tightness. DIAGNOSIS Your health care provider will take a medical history and perform a physical examination to diagnose COPD. Additional tests for COPD may include:  Lung (pulmonary) function tests.  Chest X-ray.  CT scan.  Blood tests. TREATMENT  Treatment for COPD may include:  Inhaler and nebulizer medicines. These help manage the symptoms of COPD and make your breathing more comfortable.  Supplemental oxygen. Supplemental oxygen is only helpful if you have a low oxygen level in your blood.  Exercise and physical activity. These are beneficial for nearly all people with COPD.  Lung surgery or transplant.  Nutrition therapy to gain weight, if you are underweight.  Pulmonary rehabilitation. This may involve working with a team of health care providers and specialists, such as respiratory, occupational, and physical therapists. HOME CARE INSTRUCTIONS  Take all medicines  (inhaled or pills) as directed by your health care provider.  Avoid over-the-counter medicines or cough syrups that dry up your airway (such as antihistamines) and slow down the elimination of secretions unless instructed otherwise by your health care provider.  If you are a smoker, the most important thing that you can do is stop smoking. Continuing to smoke will cause further lung damage and breathing trouble. Ask your health care provider for help with quitting smoking. He or she can direct you to community resources or hospitals that provide support.  Avoid exposure to irritants such as smoke, chemicals, and fumes that aggravate your breathing.  Use oxygen therapy and pulmonary rehabilitation if directed by your health care provider. If you require home oxygen therapy, ask your health care provider whether you should purchase a pulse oximeter to measure your oxygen level at home.  Avoid contact with individuals who have a contagious illness.  Avoid extreme temperature and humidity changes.  Eat healthy foods. Eating smaller, more frequent meals and resting before meals may help you maintain your strength.  Stay active, but balance activity with periods of rest. Exercise and physical activity will help you maintain your ability to do things you want to do.  Preventing infection and hospitalization is very important when you have COPD. Make sure to receive all the vaccines your health care provider recommends, especially the pneumococcal and influenza vaccines. Ask your health care provider whether you need a pneumonia vaccine.  Learn and use relaxation techniques to manage stress.  Learn and use controlled breathing techniques as directed by your health care provider. Controlled breathing techniques include:  Pursed lip breathing. Start by breathing in (inhaling) through your nose for 1 second. Then, purse your lips as if you were   going to whistle and breathe out (exhale) through the  pursed lips for 2 seconds.  Diaphragmatic breathing. Start by putting one hand on your abdomen just above your waist. Inhale slowly through your nose. The hand on your abdomen should move out. Then purse your lips and exhale slowly. You should be able to feel the hand on your abdomen moving in as you exhale.  Learn and use controlled coughing to clear mucus from your lungs. Controlled coughing is a series of short, progressive coughs. The steps of controlled coughing are: 1. Lean your head slightly forward. 2. Breathe in deeply using diaphragmatic breathing. 3. Try to hold your breath for 3 seconds. 4. Keep your mouth slightly open while coughing twice. 5. Spit any mucus out into a tissue. 6. Rest and repeat the steps once or twice as needed. SEEK MEDICAL CARE IF:  You are coughing up more mucus than usual.  There is a change in the color or thickness of your mucus.  Your breathing is more labored than usual.  Your breathing is faster than usual. SEEK IMMEDIATE MEDICAL CARE IF:  You have shortness of breath while you are resting.  You have shortness of breath that prevents you from:  Being able to talk.  Performing your usual physical activities.  You have chest pain lasting longer than 5 minutes.  Your skin color is more cyanotic than usual.  You measure low oxygen saturations for longer than 5 minutes with a pulse oximeter. MAKE SURE YOU:  Understand these instructions.  Will watch your condition.  Will get help right away if you are not doing well or get worse.   This information is not intended to replace advice given to you by your health care provider. Make sure you discuss any questions you have with your health care provider.   Document Released: 03/07/2005 Document Revised: 06/18/2014 Document Reviewed: 01/22/2013 Elsevier Interactive Patient Education 2016 [employer]  

## 2015-07-27 ENCOUNTER — Inpatient Hospital Stay (HOSPITAL_BASED_OUTPATIENT_CLINIC_OR_DEPARTMENT_OTHER): Payer: Medicare Other | Admitting: Internal Medicine

## 2015-07-27 ENCOUNTER — Ambulatory Visit
Admission: RE | Admit: 2015-07-27 | Discharge: 2015-07-27 | Disposition: A | Discharge status: Home / Self Care | Payer: Medicare Other | Source: Ambulatory Visit | Attending: Radiation Oncology | Admitting: Radiation Oncology

## 2015-07-27 ENCOUNTER — Encounter: Payer: Self-pay | Admitting: *Deleted

## 2015-07-27 ENCOUNTER — Encounter: Payer: Self-pay | Admitting: Radiation Oncology

## 2015-07-27 ENCOUNTER — Encounter: Payer: Self-pay | Admitting: Internal Medicine

## 2015-07-27 VITALS — BP 147/63 | HR 70 | Temp 99.0°F | Resp 18 | Ht 69.0 in | Wt 217.2 lb

## 2015-07-27 DIAGNOSIS — N400 Enlarged prostate without lower urinary tract symptoms: Secondary | ICD-10-CM

## 2015-07-27 DIAGNOSIS — Z86010 Personal history of colonic polyps: Secondary | ICD-10-CM

## 2015-07-27 DIAGNOSIS — G8929 Other chronic pain: Secondary | ICD-10-CM

## 2015-07-27 DIAGNOSIS — C3411 Malignant neoplasm of upper lobe, right bronchus or lung: Secondary | ICD-10-CM | POA: Diagnosis not present

## 2015-07-27 DIAGNOSIS — I499 Cardiac arrhythmia, unspecified: Secondary | ICD-10-CM | POA: Diagnosis not present

## 2015-07-27 DIAGNOSIS — M129 Arthropathy, unspecified: Secondary | ICD-10-CM | POA: Diagnosis not present

## 2015-07-27 DIAGNOSIS — K219 Gastro-esophageal reflux disease without esophagitis: Secondary | ICD-10-CM

## 2015-07-27 DIAGNOSIS — Z8042 Family history of malignant neoplasm of prostate: Secondary | ICD-10-CM

## 2015-07-27 DIAGNOSIS — J449 Chronic obstructive pulmonary disease, unspecified: Secondary | ICD-10-CM | POA: Insufficient documentation

## 2015-07-27 DIAGNOSIS — M545 Low back pain: Secondary | ICD-10-CM | POA: Diagnosis not present

## 2015-07-27 DIAGNOSIS — E785 Hyperlipidemia, unspecified: Secondary | ICD-10-CM

## 2015-07-27 DIAGNOSIS — Z923 Personal history of irradiation: Secondary | ICD-10-CM

## 2015-07-27 DIAGNOSIS — G250 Essential tremor: Secondary | ICD-10-CM

## 2015-07-27 DIAGNOSIS — F17210 Nicotine dependence, cigarettes, uncomplicated: Secondary | ICD-10-CM

## 2015-07-27 DIAGNOSIS — I10 Essential (primary) hypertension: Secondary | ICD-10-CM

## 2015-07-27 DIAGNOSIS — Z8051 Family history of malignant neoplasm of kidney: Secondary | ICD-10-CM

## 2015-07-27 DIAGNOSIS — Z7982 Long term (current) use of aspirin: Secondary | ICD-10-CM | POA: Diagnosis not present

## 2015-07-27 DIAGNOSIS — Z8052 Family history of malignant neoplasm of bladder: Secondary | ICD-10-CM

## 2015-07-27 DIAGNOSIS — Z79899 Other long term (current) drug therapy: Secondary | ICD-10-CM

## 2015-07-27 DIAGNOSIS — Z808 Family history of malignant neoplasm of other organs or systems: Secondary | ICD-10-CM | POA: Diagnosis not present

## 2015-07-27 DIAGNOSIS — Z8551 Personal history of malignant neoplasm of bladder: Secondary | ICD-10-CM | POA: Diagnosis not present

## 2015-07-27 DIAGNOSIS — R0902 Hypoxemia: Secondary | ICD-10-CM

## 2015-07-27 NOTE — Progress Notes (Signed)
Pt here to get ct scans from md.  Low dose CT scan was performed because of smoking hx. Copd. No c/o from anything for today

## 2015-07-27 NOTE — Progress Notes (Signed)
Patient provided appointments. He states that he is unable to do the chemotherapy class and apt with Dr. Genevive Bi tomorrow due to previous obligations. He would also like to post pone the chemotherapy appointments for at least a week. Dr. Rogue Bussing made aware of patient's decision.

## 2015-07-27 NOTE — Patient Instructions (Signed)
Carboplatin injection What is this medicine? CARBOPLATIN (KAR boe pla tin) is a chemotherapy drug. It targets fast dividing cells, like cancer cells, and causes these cells to die. This medicine is used to treat ovarian cancer and many other cancers. This medicine may be used for other purposes; ask your health care provider or pharmacist if you have questions. What should I tell my health care provider before I take this medicine? They need to know if you have any of these conditions: -blood disorders -hearing problems -kidney disease -recent or ongoing radiation therapy -an unusual or allergic reaction to carboplatin, cisplatin, other chemotherapy, other medicines, foods, dyes, or preservatives -pregnant or trying to get pregnant -breast-feeding How should I use this medicine? This drug is usually given as an infusion into a vein. It is administered in a hospital or clinic by a specially trained health care professional. Talk to your pediatrician regarding the use of this medicine in children. Special care may be needed. Overdosage: If you think you have taken too much of this medicine contact a poison control center or emergency room at once. NOTE: This medicine is only for you. Do not share this medicine with others. What if I miss a dose? It is important not to miss a dose. Call your doctor or health care professional if you are unable to keep an appointment. What may interact with this medicine? -medicines for seizures -medicines to increase blood counts like filgrastim, pegfilgrastim, sargramostim -some antibiotics like amikacin, gentamicin, neomycin, streptomycin, tobramycin -vaccines Talk to your doctor or health care professional before taking any of these medicines: -acetaminophen -aspirin -ibuprofen -ketoprofen -naproxen This list may not describe all possible interactions. Give your health care provider a list of all the medicines, herbs, non-prescription drugs, or dietary  supplements you use. Also tell them if you smoke, drink alcohol, or use illegal drugs. Some items may interact with your medicine. What should I watch for while using this medicine? Your condition will be monitored carefully while you are receiving this medicine. You will need important blood work done while you are taking this medicine. This drug may make you feel generally unwell. This is not uncommon, as chemotherapy can affect healthy cells as well as cancer cells. Report any side effects. Continue your course of treatment even though you feel ill unless your doctor tells you to stop. In some cases, you may be given additional medicines to help with side effects. Follow all directions for their use. Call your doctor or health care professional for advice if you get a fever, chills or sore throat, or other symptoms of a cold or flu. Do not treat yourself. This drug decreases your body's ability to fight infections. Try to avoid being around people who are sick. This medicine may increase your risk to bruise or bleed. Call your doctor or health care professional if you notice any unusual bleeding. Be careful brushing and flossing your teeth or using a toothpick because you may get an infection or bleed more easily. If you have any dental work done, tell your dentist you are receiving this medicine. Avoid taking products that contain aspirin, acetaminophen, ibuprofen, naproxen, or ketoprofen unless instructed by your doctor. These medicines may hide a fever. Do not become pregnant while taking this medicine. Women should inform their doctor if they wish to become pregnant or think they might be pregnant. There is a potential for serious side effects to an unborn child. Talk to your health care professional or pharmacist for more information.   Do not breast-feed an infant while taking this medicine. What side effects may I notice from receiving this medicine? Side effects that you should report to your  doctor or health care professional as soon as possible: -allergic reactions like skin rash, itching or hives, swelling of the face, lips, or tongue -signs of infection - fever or chills, cough, sore throat, pain or difficulty passing urine -signs of decreased platelets or bleeding - bruising, pinpoint red spots on the skin, black, tarry stools, nosebleeds -signs of decreased red blood cells - unusually weak or tired, fainting spells, lightheadedness -breathing problems -changes in hearing -changes in vision -chest pain -high blood pressure -low blood counts - This drug may decrease the number of white blood cells, red blood cells and platelets. You may be at increased risk for infections and bleeding. -nausea and vomiting -pain, swelling, redness or irritation at the injection site -pain, tingling, numbness in the hands or feet -problems with balance, talking, walking -trouble passing urine or change in the amount of urine Side effects that usually do not require medical attention (report to your doctor or health care professional if they continue or are bothersome): -hair loss -loss of appetite -metallic taste in the mouth or changes in taste This list may not describe all possible side effects. Call your doctor for medical advice about side effects. You may report side effects to FDA at 1-800-FDA-1088. Where should I keep my medicine? This drug is given in a hospital or clinic and will not be stored at home. NOTE: This sheet is a summary. It may not cover all possible information. If you have questions about this medicine, talk to your doctor, pharmacist, or health care provider.    2016, Elsevier/Gold Standard. (2007-09-02 14:38:05) Paclitaxel injection What is this medicine? PACLITAXEL (PAK li TAX el) is a chemotherapy drug. It targets fast dividing cells, like cancer cells, and causes these cells to die. This medicine is used to treat ovarian cancer, breast cancer, and other  cancers. This medicine may be used for other purposes; ask your health care provider or pharmacist if you have questions. What should I tell my health care provider before I take this medicine? They need to know if you have any of these conditions: -blood disorders -irregular heartbeat -infection (especially a virus infection such as chickenpox, cold sores, or herpes) -liver disease -previous or ongoing radiation therapy -an unusual or allergic reaction to paclitaxel, alcohol, polyoxyethylated castor oil, other chemotherapy agents, other medicines, foods, dyes, or preservatives -pregnant or trying to get pregnant -breast-feeding How should I use this medicine? This drug is given as an infusion into a vein. It is administered in a hospital or clinic by a specially trained health care professional. Talk to your pediatrician regarding the use of this medicine in children. Special care may be needed. Overdosage: If you think you have taken too much of this medicine contact a poison control center or emergency room at once. NOTE: This medicine is only for you. Do not share this medicine with others. What if I miss a dose? It is important not to miss your dose. Call your doctor or health care professional if you are unable to keep an appointment. What may interact with this medicine? Do not take this medicine with any of the following medications: -disulfiram -metronidazole This medicine may also interact with the following medications: -cyclosporine -diazepam -ketoconazole -medicines to increase blood counts like filgrastim, pegfilgrastim, sargramostim -other chemotherapy drugs like cisplatin, doxorubicin, epirubicin, etoposide, teniposide, vincristine -quinidine -testosterone -  vaccines -verapamil Talk to your doctor or health care professional before taking any of these medicines: -acetaminophen -aspirin -ibuprofen -ketoprofen -naproxen This list may not describe all possible  interactions. Give your health care provider a list of all the medicines, herbs, non-prescription drugs, or dietary supplements you use. Also tell them if you smoke, drink alcohol, or use illegal drugs. Some items may interact with your medicine. What should I watch for while using this medicine? Your condition will be monitored carefully while you are receiving this medicine. You will need important blood work done while you are taking this medicine. This drug may make you feel generally unwell. This is not uncommon, as chemotherapy can affect healthy cells as well as cancer cells. Report any side effects. Continue your course of treatment even though you feel ill unless your doctor tells you to stop. This medicine can cause serious allergic reactions. To reduce your risk you will need to take other medicine(s) before treatment with this medicine. In some cases, you may be given additional medicines to help with side effects. Follow all directions for their use. Call your doctor or health care professional for advice if you get a fever, chills or sore throat, or other symptoms of a cold or flu. Do not treat yourself. This drug decreases your body's ability to fight infections. Try to avoid being around people who are sick. This medicine may increase your risk to bruise or bleed. Call your doctor or health care professional if you notice any unusual bleeding. Be careful brushing and flossing your teeth or using a toothpick because you may get an infection or bleed more easily. If you have any dental work done, tell your dentist you are receiving this medicine. Avoid taking products that contain aspirin, acetaminophen, ibuprofen, naproxen, or ketoprofen unless instructed by your doctor. These medicines may hide a fever. Do not become pregnant while taking this medicine. Women should inform their doctor if they wish to become pregnant or think they might be pregnant. There is a potential for serious side  effects to an unborn child. Talk to your health care professional or pharmacist for more information. Do not breast-feed an infant while taking this medicine. Men are advised not to father a child while receiving this medicine. This product may contain alcohol. Ask your pharmacist or healthcare provider if this medicine contains alcohol. Be sure to tell all healthcare providers you are taking this medicine. Certain medicines, like metronidazole and disulfiram, can cause an unpleasant reaction when taken with alcohol. The reaction includes flushing, headache, nausea, vomiting, sweating, and increased thirst. The reaction can last from 30 minutes to several hours. What side effects may I notice from receiving this medicine? Side effects that you should report to your doctor or health care professional as soon as possible: -allergic reactions like skin rash, itching or hives, swelling of the face, lips, or tongue -low blood counts - This drug may decrease the number of white blood cells, red blood cells and platelets. You may be at increased risk for infections and bleeding. -signs of infection - fever or chills, cough, sore throat, pain or difficulty passing urine -signs of decreased platelets or bleeding - bruising, pinpoint red spots on the skin, black, tarry stools, nosebleeds -signs of decreased red blood cells - unusually weak or tired, fainting spells, lightheadedness -breathing problems -chest pain -high or low blood pressure -mouth sores -nausea and vomiting -pain, swelling, redness or irritation at the injection site -pain, tingling, numbness in the hands or   feet -slow or irregular heartbeat -swelling of the ankle, feet, hands Side effects that usually do not require medical attention (report to your doctor or health care professional if they continue or are bothersome): -bone pain -complete hair loss including hair on your head, underarms, pubic hair, eyebrows, and eyelashes -changes in  the color of fingernails -diarrhea -loosening of the fingernails -loss of appetite -muscle or joint pain -red flush to skin -sweating This list may not describe all possible side effects. Call your doctor for medical advice about side effects. You may report side effects to FDA at 1-800-FDA-1088. Where should I keep my medicine? This drug is given in a hospital or clinic and will not be stored at home. NOTE: This sheet is a summary. It may not cover all possible information. If you have questions about this medicine, talk to your doctor, pharmacist, or health care provider.    2016, Elsevier/Gold Standard. (2015-01-13 13:02:56)  

## 2015-07-27 NOTE — Progress Notes (Signed)
[facility] NOTE  Patient Care Team: [name], MD as PCP - General (Family Medicine)  CHIEF COMPLAINTS/PURPOSE OF CONSULTATION:   # OCT 2016- RUL POORLY DIFF CA [clinically non-small cell] ; STAGE IIB [T3-2 separate nodules in RUL; N0] [s/p CT guided bx]- s/p RT [finished mid jan 2017]; FEB 2017-CT scan- regression of RUL nodules; Start carbo-taxol q 3 w x4  # OCT 2016- 2 sub cm nodules in Right LL- Feb CT 2017- STABLE  # OCT 2016-NON-invasive bladder urothelial cancer; high grade [Dr. [name]; s/p TURBT [Feb 2017- NEG]  # Pneumothorax [oct 2016 s/p CT Bx]   HISTORY OF PRESENTING ILLNESS:  Joseph Graham 75 y.o. male with a history of long-standing smoking/COPD-  Non-small cell/ poorly differentiated malignancy of the right upper lobe  T3 N0 status post radiation is here for follow-up/and review the results of his restaging CAT scan.    Patient finished  His radiation in middle of January 2017.  He tolerated fairly well. Patient's appetite is good. Denies any weight loss.   He  Denies any unusual cough or shortness of breath or chest pain.  His appetite is good. No weight loss. Gained weight.   ROS: A complete 10 point review of system is done which is negative for mentioned above in history of present illness.  MEDICAL HISTORY:  Past Medical History  Diagnosis Date  . Colon polyps     next colonoscopy due around 2018  . HTN (hypertension)   . Alcohol abuse, in remission     remote  . Benign essential tremor     improved on metoprolol and gabapentin  . Dyslipidemia     mild off meds  . Chronic low back pain     MRI 2004, multilevel DDD  . BPH (benign prostatic hypertrophy) 12/30/2012  . Personal history of tobacco use, presenting hazards to health 02/18/2015    quit 06/2011, restarted 2014  . Multiple pulmonary nodules 02/2015  . COPD (chronic obstructive pulmonary disease) ([place])   . GERD (gastroesophageal reflux disease)   . Arthritis    BACK AND NECK  . Shortness of breath dyspnea     OCC WITH EXERTION  . Cardiac arrhythmia 03/2010    h/o a flutter and CM, s/p ablation, normal stress test  . Primary cancer of bladder ([place]) 2016    Budzyn  . Primary lung cancer ([place]) 2016  . Complication of anesthesia     DID NOT GET COMPLETELY NUMB WITH TONSILLECTOMY    SURGICAL HISTORY: Past Surgical History  Procedure Laterality Date  . Hemorroidectomy  1982  . A flutter ablation  03/2010  . Cataract extraction    . Carotid US  03/2010    WNL  . Colonoscopy  09/2011    polyps, rpt due 5 yrs, mild diverticulosis [name])  . Tonsillectomy  1964  . Electromagnetic navigation brochoscopy N/A 03/02/2015    Procedure: ELECTROMAGNETIC NAVIGATION BRONCHOSCOPY;  Surgeon: [name], MD;  Location: [facility] ORS;  Service: Cardiopulmonary;  Laterality: N/A;  . Transurethral resection of bladder tumor N/A 04/06/2015    Procedure: TRANSURETHRAL RESECTION OF BLADDER TUMOR (TURBT) right ureteral stent placement;  Surgeon: [name], MD;  Location: [facility] ORS;  Service: Urology;  Laterality: N/A;  . Cystoscopy w/ retrogrades Bilateral 04/06/2015    Procedure: CYSTOSCOPY WITH RETROGRADE PYELOGRAM;  Surgeon: [name], MD;  Location: [facility];  Service: Urology;  Laterality: Bilateral;  . Urinary stent removal  04/14/15  . Transurethral resection  of bladder tumor N/A 07/13/2015    Procedure: TRANSURETHRAL RESECTION OF BLADDER TUMOR (TURBT);  Surgeon: [name], MD;  Location: [facility] OR[facility];  Service: Urology;  Laterality: N/A;    SOCIAL HISTORY: Patient is retired from Press [employer]. He lives in [place]. Social History   Social History  . Marital Status: Married    Spouse Name: N/A  . Number of Children: N/A  . Years of Education: N/A   Occupational History  . Not on file.   Social History Main Topics  . Smoking status: Former Smoker -- 1.50 packs/day for 61 years    Types: Cigarettes    Quit date: 03/08/2015  .  Smokeless tobacco: Never Used     Comment: cut back 0.5 cigarettes--stopped 03/07/15; now now chantix  . Alcohol Use: No     Comment: has not drank in 37 years  . Drug Use: No  . Sexual Activity: Not on file   Other Topics Concern  . Not on file   Social History Narrative   Caffeine: 1 cup decaf/day   Lives with wife   Occupation: retired, worked Press photographer   Activity: gym 3x/wk, Immunologist, hobbies (paint, building things)   Diet: healthy, good water, fruits/vegetables daily, red meat 1x/wk    FAMILY HISTORY: Family History  Problem Relation Age of Onset  . Cervical cancer Mother 21  . Hypertension Father   . Stroke Brother 67  . Tremor Father     and several uncles/aunts (not parkinson's)  . Bladder Cancer Brother 53  . Prostate cancer Neg Hx   . Kidney cancer Neg Hx     ALLERGIES:  has No Known Allergies.  MEDICATIONS:  Current Outpatient Prescriptions  Medication Sig Dispense Refill  . albuterol (PROVENTIL HFA;VENTOLIN HFA) 108 (90 Base) MCG/ACT inhaler Inhale 2 puffs into the lungs every 6 (six) hours as needed for wheezing or shortness of breath. 1 Inhaler 2  . aspirin EC 81 MG tablet Take 81 mg by mouth daily.    . famotidine (PEPCID) 10 MG tablet Take 10 mg by mouth 2 (two) times daily.     . fluticasone furoate-vilanterol (BREO ELLIPTA) 100-25 MCG/INH AEPB Inhale 1 puff into the lungs daily. 60 each 5  . gabapentin (NEURONTIN) 100 MG capsule TAKE 1 CAPSULE BY MOUTH TWICE A DAY AS NEEDED (Patient taking differently: Take 100 mg by mouth 2 (two) times daily. TAKE 1 CAPSULE BY MOUTH TWICE A DAY) 180 capsule 3  . lisinopril (PRINIVIL,ZESTRIL) 10 MG tablet Take 1 tablet (10 mg total) by mouth daily. (Patient taking differently: Take 10 mg by mouth every evening. ) 90 tablet 3  . metoprolol tartrate (LOPRESSOR) 25 MG tablet Take 0.5 tablets (12.5 mg total) by mouth 2 (two) times daily. 90 tablet 3  . umeclidinium bromide (INCRUSE ELLIPTA) 62.5 MCG/INH AEPB Inhale 1 puff into  the lungs daily. 30 each 5   No current facility-administered medications for this visit.  Marland Kitchen  PHYSICAL EXAMINATION: ECOG PERFORMANCE STATUS: 0 - Asymptomatic  Filed Vitals:   07/27/15 0959  BP: 147/63  Pulse: 70  Temp: 99 F (37.2 C)  Resp: 18   Filed Weights   07/27/15 0959  Weight: 217 lb 2.5 oz (98.5 kg)    GENERAL:alert, no distress and comfortable. He is accompanied by his wife.  SKIN: skin color, texture, turgor are normal, no rashes or significant lesions EYES: normal, conjunctiva are pink and non-injected, sclera clear OROPHARYNX:no exudate, no erythema and lips, buccal mucosa, and tongue normal  NECK: supple, thyroid normal size, non-tender, without nodularity LYMPH:  no palpable lymphadenopathy in the cervical, axillary or inguinal LUNGS: clear to auscultation and percussion with normal breathing effort HEART: regular rate & rhythm and no murmurs and no lower extremity edema ABDOMEN:abdomen soft, non-tender and normal bowel sounds Musculoskeletal:no cyanosis of digits and no clubbing  PSYCH: alert & oriented x 3 with fluent speech NEURO: no focal motor/sensory deficits  LABORATORY DATA:  I have reviewed the data as listed Lab Results  Component Value Date   WBC 6.0 07/18/2015   HGB 14.0 07/18/2015   HCT 41.1 07/18/2015   MCV 95.0 07/18/2015   PLT 219 07/18/2015    Recent Labs  03/22/15 0504 06/30/15 1006 07/18/15 1007  NA 137 139 135  K 4.5 3.9 4.0  CL 101 102 101  CO2 '28 30 30  '$ GLUCOSE 106* 120* 165*  BUN 33* 25* 27*  CREATININE 0.93 0.86 0.92  CALCIUM 9.0 9.0 8.9  GFRNONAA >60 >60 >60  GFRAA >60 >60 >60  PROT 7.5  --  7.8  ALBUMIN 4.2  --  4.1  AST 29  --  25  ALT 29  --  28  ALKPHOS 80  --  99  BILITOT 0.6  --  0.4    RADIOGRAPHIC STUDIES:  ASSESSMENT & PLAN:   # Right UL POORLY DIFF CA- T3 N0- proceed with radiation therapy.  Currently status post radiation-  [facility] January 2017.  CT- shows slight progression of the 2 right  upper lobe lung nodules. I would recommend adjuvant chemotherapy carbotaxol every 3 weeks 4.   Discussed the potential side effects including but not limited to-increasing fatigue, nausea vomiting, diarrhea, hair loss, sores in the mouth, increase risk of infection and also neuropathy. I would recommend Neulasta. Recommend chemotherapy education; port placement.  # Subcentimeter right lower lobe lung nodules- question etiology/too small for the threshold of PET scan or biopsy. STABLE on follow up CT scan.   # Noninvasive high-grade bladder cancer-  Most recent cystoscopy negative.  Recommending holding BCG for now. i will discuss with urology.   # Patient will follow-up with me next week/CBC CMP/ prior to starting cycle #1. Reviewed the images with the patient in detail.  # 25 minutes face-to-face with the patient discussing the above plan of care; more than 50% of spent on prognosis/ natural history; counseling and coordination.    Cammie Sickle, MD 07/27/2015 10:32 AM

## 2015-07-27 NOTE — Progress Notes (Unsigned)
RN spoke with Dr. Genevive Bi. MD can arrange for port placement to be scheduled for March 1'st. I will let the patient know.

## 2015-07-27 NOTE — Progress Notes (Signed)
Radiation Oncology Follow up Note  Name: Joseph Graham   Date:   07/27/2015 MRN:  [id number] DOB: 04-27-1941    This 75 y.o. male presents to the clinic today for follow-up for radiation therapy to his right upper lobe for poorly differentiated non-small cell lung cancer.  REFERRING PROVIDER: [name], MD  HPI: Patient is a 75 year old male now one month out having completed radiation therapy to his right upper lobe for 2 separate nodules included in a single field up to 7000 cGy. He also had noted 2 subcentimeter nodules in the right lower lobe. He is seen today in routine follow-up one month out and is doing fairly well.. He had a repeat CT scan yesterday showing interval decrease in size of the dominant spiculated right upper lobe nodules with no new or progressive findings. The small right lung nodules are stable in the interval. Patient also has a history of noninvasive high-grade bladder cancer. He was been seen by medical oncology based on possible locally advanced nature of the 2 right upper lobe nodules he has been scheduled for 4 cycles of carbotaxol. He specifically denies any dysphagia cough marked shortness of breath or dyspnea on exertion.  COMPLICATIONS OF TREATMENT: none  FOLLOW UP COMPLIANCE: keeps appointments   PHYSICAL EXAM:  There were no vitals taken for this visit. No cervical or supraclavicular adenopathy is appreciated. Well-developed well-nourished patient in NAD. HEENT reveals PERLA, EOMI, discs not visualized.  Oral cavity is clear. No oral mucosal lesions are identified. Neck is clear without evidence of cervical or supraclavicular adenopathy. Lungs are clear to A&P. Cardiac examination is essentially unremarkable with regular rate and rhythm without murmur rub or thrill. Abdomen is benign with no organomegaly or masses noted. Motor sensory and DTR levels are equal and symmetric in the upper and lower extremities. Cranial nerves II through XII are grossly  intact. Proprioception is intact. No peripheral adenopathy or edema is identified. No motor or sensory levels are noted. Crude visual fields are within normal range.  RADIOLOGY RESULTS: Recent CT scan is reviewed  PLAN: I discussed the case with medical oncology. Patient is to commence 4 cycles of carbotaxol under medical oncology's direction. Otherwise I'm please was overall progress. Not sure the etiology of his lower lobe lesions since they are stable although patient certainly if these are other areas of carcinoma will benefit from systemic chemotherapy. I have asked to see him back in 4 months for follow-up. Patient is to call sooner with any concerns.  I would like to take this opportunity for allowing me to participate in the care of your patient.Armstead Peaks., MD

## 2015-07-28 ENCOUNTER — Inpatient Hospital Stay: Payer: Medicare Other | Admitting: Cardiothoracic Surgery

## 2015-07-28 ENCOUNTER — Inpatient Hospital Stay: Payer: Medicare Other

## 2015-07-29 ENCOUNTER — Ambulatory Visit (INDEPENDENT_AMBULATORY_CARE_PROVIDER_SITE_OTHER): Payer: Medicare Other | Admitting: Urology

## 2015-07-29 VITALS — BP 166/71 | HR 70 | Ht 69.0 in | Wt 216.1 lb

## 2015-07-29 DIAGNOSIS — C674 Malignant neoplasm of posterior wall of bladder: Secondary | ICD-10-CM | POA: Diagnosis not present

## 2015-07-29 NOTE — Progress Notes (Signed)
07/29/2015 12:45 PM   Joseph Graham [birth year] [id number]  Referring provider: [name], MD [address] [place], [place] [address]  Chief Complaint  Patient presents with  . Results  . Routine Post Op    HPI: The patient is a 75 year old gentleman who has both lung and bladder lesions. He had a 2-3 cm exophytic lesion in the midline posterior bladder wall superior to the trigone during office cystoscopy at his last visit. At that time, there was more concern about his lung lesion, so we planned to have his lung lesion addressed first with the TURBT to follow lung surgery . He had a lung biopsy which showed poorly differentiated carcinoma of unclear origin. There is concern it may be metastasis from the bladder, so he was sent back now for resection of his bladder tumor as well as to get a tissue specimen to determine the next step in his treatment. He has since undergone TURBT. His pathology shows noninvasive high-grade urothelial carcinoma. Is unclear TA or T1 disease based on the pathology report. There was no muscle in the specimen. It is a different carcinoma morphologically from his lung cancer. It is not clear at this time whether here not receiving chemotherapy as part of his undifferentiated carcinoma of the lung treatment.  Interval History:  The patient has done well since being seen in our clinic. He is undergoing radiation currently for his lung cancer. Is scheduled to be finished mid January 2017. He is not scheduled to receive chemotherapy at this time. He does note occasional spraying of his stream from his meatal stenosis. He is not using his meatal dilator. He does not desire any further dilation this time due to pain.    February 2017 interval history: The patient underwent repeat TURBT which was negative. Since official diagnosis high grade T1 TCC with no muscle involvement. He is scheduled to go under carboxotaxol chemotherapy for his lung lesion  starting next week. He is otherwise doing well. He is voiding with a good stream. PMH: Past Medical History  Diagnosis Date  . Colon polyps     next colonoscopy due around 2018  . HTN (hypertension)   . Alcohol abuse, in remission     remote  . Benign essential tremor     improved on metoprolol and gabapentin  . Dyslipidemia     mild off meds  . Chronic low back pain     MRI 2004, multilevel DDD  . BPH (benign prostatic hypertrophy) 12/30/2012  . Personal history of tobacco use, presenting hazards to health 02/18/2015    quit 06/2011, restarted 2014  . Multiple pulmonary nodules 02/2015  . COPD (chronic obstructive pulmonary disease) ([place])   . GERD (gastroesophageal reflux disease)   . Arthritis     BACK AND NECK  . Shortness of breath dyspnea     OCC WITH EXERTION  . Cardiac arrhythmia 03/2010    h/o a flutter and CM, s/p ablation, normal stress test  . Primary cancer of bladder ([place]) 2016    Joseph Graham  . Primary lung cancer ([place]) 2016  . Complication of anesthesia     DID NOT GET COMPLETELY NUMB WITH TONSILLECTOMY    Surgical History: Past Surgical History  Procedure Laterality Date  . Hemorroidectomy  1982  . A flutter ablation  03/2010  . Cataract extraction    . Carotid US  03/2010    WNL  . Colonoscopy  09/2011    polyps, rpt due 5  yrs, mild diverticulosis Joseph Graham)  . Tonsillectomy  1964  . Electromagnetic navigation brochoscopy N/A 03/02/2015    Procedure: ELECTROMAGNETIC NAVIGATION BRONCHOSCOPY;  Surgeon: Joseph Boehringer, MD;  Location: [facility];  Service: Cardiopulmonary;  Laterality: N/A;  . Transurethral resection of bladder tumor N/A 04/06/2015    Procedure: TRANSURETHRAL RESECTION OF BLADDER TUMOR (TURBT) right ureteral stent placement;  Surgeon: [name], MD;  Location: [facility];  Service: Urology;  Laterality: N/A;  . Cystoscopy w/ retrogrades Bilateral 04/06/2015    Procedure: CYSTOSCOPY WITH RETROGRADE PYELOGRAM;  Surgeon: [name], MD;   Location: [facility];  Service: Urology;  Laterality: Bilateral;  . Urinary stent removal  04/14/15  . Transurethral resection of bladder tumor N/A 07/13/2015    Procedure: TRANSURETHRAL RESECTION OF BLADDER TUMOR (TURBT);  Surgeon: [name], MD;  Location: [facility];  Service: Urology;  Laterality: N/A;    Home Medications:    Medication List       This list is accurate as of: 07/29/15 12:45 PM.  Always use your most recent med list.               albuterol 108 (90 Base) MCG/ACT inhaler  Commonly known as:  PROVENTIL HFA;VENTOLIN HFA  Inhale 2 puffs into the lungs every 6 (six) hours as needed for wheezing or shortness of breath.     aspirin EC 81 MG tablet  Take 81 mg by mouth daily.     famotidine 10 MG tablet  Commonly known as:  PEPCID  Take 10 mg by mouth 2 (two) times daily.     fluticasone furoate-vilanterol 100-25 MCG/INH Aepb  Commonly known as:  BREO ELLIPTA  Inhale 1 puff into the lungs daily.     gabapentin 100 MG capsule  Commonly known as:  NEURONTIN  TAKE 1 CAPSULE BY MOUTH TWICE A DAY AS NEEDED     lisinopril 10 MG tablet  Commonly known as:  PRINIVIL,ZESTRIL  Take 1 tablet (10 mg total) by mouth daily.     metoprolol tartrate 25 MG tablet  Commonly known as:  LOPRESSOR  Take 0.5 tablets (12.5 mg total) by mouth 2 (two) times daily.     umeclidinium bromide 62.5 MCG/INH Aepb  Commonly known as:  INCRUSE ELLIPTA  Inhale 1 puff into the lungs daily.        Allergies: No Known Allergies  Family History: Family History  Problem Relation Age of Onset  . Cervical cancer Mother 26  . Hypertension Father   . Stroke Brother 2  . Tremor Father     and several uncles/aunts (not parkinson's)  . Bladder Cancer Brother 39  . Prostate cancer Neg Hx   . Kidney cancer Neg Hx     Social History:  reports that he quit smoking about 4 months ago. His smoking use included Cigarettes. He has a 91.5 pack-year smoking history. He has never used smokeless  tobacco. He reports that he does not drink alcohol or use illicit drugs.  ROS: UROLOGY Frequent Urination?: No Hard to postpone urination?: No Burning/pain with urination?: Yes Get up at night to urinate?: No Leakage of urine?: No Urine stream starts and stops?: No Trouble starting stream?: No Do you have to strain to urinate?: No Blood in urine?: No Urinary tract infection?: No Sexually transmitted disease?: No Injury to kidneys or bladder?: No Painful intercourse?: No Weak stream?: No Erection problems?: No Penile pain?: No  Gastrointestinal Nausea?: No Vomiting?: No Indigestion/heartburn?: No Diarrhea?: No Constipation?: No  Constitutional Fever: No Night sweats?: No Weight loss?: No Fatigue?: No  Skin Skin rash/lesions?: No Itching?: No  Eyes Blurred vision?: No Double vision?: No  Ears/Nose/Throat Sore throat?: No Sinus problems?: No  Hematologic/Lymphatic Swollen glands?: No Easy bruising?: No  Cardiovascular Leg swelling?: No Chest pain?: No  Respiratory Cough?: Yes Shortness of breath?: No  Endocrine Excessive thirst?: No  Musculoskeletal Back pain?: No Joint pain?: No  Neurological Headaches?: No Dizziness?: No  Psychologic Depression?: No Anxiety?: No  Physical Exam: BP 166/71 mmHg  Pulse 70  Ht '5\' 9"'$  (1.753 m)  Wt 216 lb 1.6 oz (98.022 kg)  BMI 31.90 kg/m2  Constitutional:  Alert and oriented, No acute distress. HEENT: [employer] AT, moist mucus membranes.  Trachea midline, no masses. Cardiovascular: No clubbing, cyanosis, or edema. Respiratory: Normal respiratory effort, no increased work of breathing. GI: Abdomen is soft, nontender, nondistended, no abdominal masses GU: No CVA tenderness.  Skin: No rashes, bruises or suspicious lesions. Lymph: No cervical or inguinal adenopathy. Neurologic: Grossly intact, no focal deficits, moving all 4 extremities. Psychiatric: Normal mood and affect.  Laboratory Data: Lab Results    Component Value Date   WBC 6.0 07/18/2015   HGB 14.0 07/18/2015   HCT 41.1 07/18/2015   MCV 95.0 07/18/2015   PLT 219 07/18/2015    Lab Results  Component Value Date   CREATININE 0.92 07/18/2015    Lab Results  Component Value Date   PSA 0.60 01/24/2015   PSA 0.68 12/31/2013   PSA 0.67 12/30/2012    No results found for: TESTOSTERONE  Lab Results  Component Value Date   HGBA1C 6.3 01/24/2015    Urinalysis    Component Value Date/Time   GLUCOSEU Negative 04/14/2015 0822   BILIRUBINUR Negative 04/14/2015 0822   NITRITE Negative 04/14/2015 0822   LEUKOCYTESUR 1+* 04/14/2015 0822     Assessment & Plan:    1. High-grade T1 TCC of the bladder The patient is due for surveillance cystoscopy in 3 months. He is unable to undergo intravesical therapy at this time as he is undergoing chemotherapy for his lung cancer. He will follow-up after his chemotherapy to discuss intravesical therapy. Also will follow-up in one month with a renal ultrasound to rule out iatrogenic hydronephrosis due to resection near his right ureteral orifice.  No Follow-up on file.  [name], MD  [facility] [address], [place], [place] [address] [phone]

## 2015-07-31 ENCOUNTER — Encounter: Payer: Self-pay | Admitting: Internal Medicine

## 2015-07-31 DIAGNOSIS — J449 Chronic obstructive pulmonary disease, unspecified: Secondary | ICD-10-CM | POA: Diagnosis not present

## 2015-08-02 NOTE — Patient Instructions (Signed)

## 2015-08-03 ENCOUNTER — Other Ambulatory Visit: Payer: Medicare Other

## 2015-08-04 ENCOUNTER — Other Ambulatory Visit: Payer: Medicare Other

## 2015-08-04 ENCOUNTER — Ambulatory Visit: Payer: Medicare Other | Admitting: Internal Medicine

## 2015-08-04 ENCOUNTER — Inpatient Hospital Stay: Payer: Medicare Other

## 2015-08-04 ENCOUNTER — Ambulatory Visit: Payer: Medicare Other

## 2015-08-05 ENCOUNTER — Encounter: Payer: Self-pay | Admitting: *Deleted

## 2015-08-05 ENCOUNTER — Other Ambulatory Visit: Payer: Self-pay | Admitting: Internal Medicine

## 2015-08-05 ENCOUNTER — Encounter: Payer: Self-pay | Admitting: Cardiothoracic Surgery

## 2015-08-05 ENCOUNTER — Other Ambulatory Visit: Payer: Medicare Other

## 2015-08-05 ENCOUNTER — Encounter: Payer: Self-pay | Admitting: Diagnostic Radiology

## 2015-08-05 ENCOUNTER — Inpatient Hospital Stay (HOSPITAL_BASED_OUTPATIENT_CLINIC_OR_DEPARTMENT_OTHER): Payer: Medicare Other | Admitting: Cardiothoracic Surgery

## 2015-08-05 ENCOUNTER — Encounter: Payer: Self-pay | Admitting: Internal Medicine

## 2015-08-05 ENCOUNTER — Telehealth: Payer: Self-pay | Admitting: Cardiothoracic Surgery

## 2015-08-05 VITALS — BP 127/70 | HR 67 | Temp 98.4°F | Resp 18 | Ht 69.0 in | Wt 214.1 lb

## 2015-08-05 DIAGNOSIS — C679 Malignant neoplasm of bladder, unspecified: Secondary | ICD-10-CM | POA: Diagnosis not present

## 2015-08-05 DIAGNOSIS — Z923 Personal history of irradiation: Secondary | ICD-10-CM | POA: Diagnosis not present

## 2015-08-05 DIAGNOSIS — Z8052 Family history of malignant neoplasm of bladder: Secondary | ICD-10-CM | POA: Diagnosis not present

## 2015-08-05 DIAGNOSIS — Z8051 Family history of malignant neoplasm of kidney: Secondary | ICD-10-CM | POA: Diagnosis not present

## 2015-08-05 DIAGNOSIS — Z7982 Long term (current) use of aspirin: Secondary | ICD-10-CM | POA: Diagnosis not present

## 2015-08-05 DIAGNOSIS — K219 Gastro-esophageal reflux disease without esophagitis: Secondary | ICD-10-CM | POA: Diagnosis not present

## 2015-08-05 DIAGNOSIS — Z86010 Personal history of colonic polyps: Secondary | ICD-10-CM | POA: Diagnosis not present

## 2015-08-05 DIAGNOSIS — Z8551 Personal history of malignant neoplasm of bladder: Secondary | ICD-10-CM | POA: Diagnosis not present

## 2015-08-05 DIAGNOSIS — M129 Arthropathy, unspecified: Secondary | ICD-10-CM | POA: Diagnosis not present

## 2015-08-05 DIAGNOSIS — M545 Low back pain: Secondary | ICD-10-CM | POA: Diagnosis not present

## 2015-08-05 DIAGNOSIS — Z8042 Family history of malignant neoplasm of prostate: Secondary | ICD-10-CM | POA: Diagnosis not present

## 2015-08-05 DIAGNOSIS — N400 Enlarged prostate without lower urinary tract symptoms: Secondary | ICD-10-CM | POA: Diagnosis not present

## 2015-08-05 DIAGNOSIS — Z808 Family history of malignant neoplasm of other organs or systems: Secondary | ICD-10-CM | POA: Diagnosis not present

## 2015-08-05 DIAGNOSIS — I499 Cardiac arrhythmia, unspecified: Secondary | ICD-10-CM | POA: Diagnosis not present

## 2015-08-05 DIAGNOSIS — E785 Hyperlipidemia, unspecified: Secondary | ICD-10-CM | POA: Diagnosis not present

## 2015-08-05 DIAGNOSIS — J449 Chronic obstructive pulmonary disease, unspecified: Secondary | ICD-10-CM | POA: Diagnosis not present

## 2015-08-05 DIAGNOSIS — C3411 Malignant neoplasm of upper lobe, right bronchus or lung: Secondary | ICD-10-CM | POA: Diagnosis not present

## 2015-08-05 DIAGNOSIS — I10 Essential (primary) hypertension: Secondary | ICD-10-CM | POA: Diagnosis not present

## 2015-08-05 DIAGNOSIS — G8929 Other chronic pain: Secondary | ICD-10-CM | POA: Diagnosis not present

## 2015-08-05 DIAGNOSIS — G250 Essential tremor: Secondary | ICD-10-CM | POA: Diagnosis not present

## 2015-08-05 DIAGNOSIS — Z79899 Other long term (current) drug therapy: Secondary | ICD-10-CM | POA: Diagnosis not present

## 2015-08-05 NOTE — Progress Notes (Signed)
PDL-1 by IHC [keytruda]- 11-20%.

## 2015-08-05 NOTE — Progress Notes (Signed)
Patient ID: Joseph Graham, male   DOB: 1940/07/14, 75 y.o.   MRN: [id number]  Chief Complaint  Patient presents with  . Vascular Access Problem    Referred By Dr. Ephraim Hamburger Reason for Referral Port-A-Cath  HPI Location, Quality, Duration, Severity, Timing, Context, Modifying Factors, Associated Signs and Symptoms.  Joseph Graham is a 75 y.o. male.  This patient is a 75 year old white male with a prior history of bladder tumor and lung mass. He underwent a CT-guided needle biopsy of his lung and was found to have metastatic tumor. A complete workup has not been completed and he requires a Port-A-Cath for chemotherapy. He presents today after discussion with his oncologist about the indications and risks of chemotherapy.  Joseph Graham is done well since his chest tube on the right after his pneumothorax. He does not currently complain of any significant shortness of breath. He states that he only gets short of breath with exertion but he is otherwise well. He does take Naprosyn for arthritic changes in his neck. He has a past history of narcotic dependency and does not wish to take any narcotics. He he will try Tylenol for his arthritic complaints since I've asked him to stop his Naprosyn.   Past Medical History  Diagnosis Date  . Colon polyps     next colonoscopy due around 2018  . HTN (hypertension)   . Alcohol abuse, in remission     remote  . Benign essential tremor     improved on metoprolol and gabapentin  . Dyslipidemia     mild off meds  . Chronic low back pain     MRI 2004, multilevel DDD  . BPH (benign prostatic hypertrophy) 12/30/2012  . Personal history of tobacco use, presenting hazards to health 02/18/2015    quit 06/2011, restarted 2014  . Multiple pulmonary nodules 02/2015  . COPD (chronic obstructive pulmonary disease) ([place])   . GERD (gastroesophageal reflux disease)   . Arthritis     BACK AND NECK  . Shortness of breath dyspnea     OCC WITH EXERTION  . Cardiac  arrhythmia 03/2010    h/o a flutter and CM, s/p ablation, normal stress test  . Primary cancer of bladder ([place]) 2016    Joseph Graham  . Primary lung cancer ([place]) 2016  . Complication of anesthesia     DID NOT GET COMPLETELY NUMB WITH TONSILLECTOMY    Past Surgical History  Procedure Laterality Date  . Hemorroidectomy  1982  . A flutter ablation  03/2010  . Cataract extraction    . Carotid US  03/2010    WNL  . Colonoscopy  09/2011    polyps, rpt due 5 yrs, mild diverticulosis Joseph Graham)  . Tonsillectomy  1964  . Electromagnetic navigation brochoscopy N/A 03/02/2015    Procedure: ELECTROMAGNETIC NAVIGATION BRONCHOSCOPY;  Surgeon: [name], MD;  Location: [facility];  Service: Cardiopulmonary;  Laterality: N/A;  . Transurethral resection of bladder tumor N/A 04/06/2015    Procedure: TRANSURETHRAL RESECTION OF BLADDER TUMOR (TURBT) right ureteral stent placement;  Surgeon: [name], MD;  Location: [facility];  Service: Urology;  Laterality: N/A;  . Cystoscopy w/ retrogrades Bilateral 04/06/2015    Procedure: CYSTOSCOPY WITH RETROGRADE PYELOGRAM;  Surgeon: [name], MD;  Location: [facility];  Service: Urology;  Laterality: Bilateral;  . Urinary stent removal  04/14/15  . Transurethral resection of bladder tumor N/A 07/13/2015    Procedure: TRANSURETHRAL RESECTION OF BLADDER TUMOR (TURBT);  Surgeon: [name],  MD;  Location: [facility];  Service: Urology;  Laterality: N/A;    Family History  Problem Relation Age of Onset  . Cervical cancer Mother 66  . Hypertension Father   . Stroke Brother 73  . Tremor Father     and several uncles/aunts (not parkinson's)  . Bladder Cancer Brother 64  . Prostate cancer Neg Hx   . Kidney cancer Neg Hx     Social History Social History  Substance Use Topics  . Smoking status: Former Smoker -- 1.50 packs/day for 61 years    Types: Cigarettes    Quit date: 03/08/2015  . Smokeless tobacco: Never Used     Comment: cut back 0.5  cigarettes--stopped 03/07/15; now now chantix  . Alcohol Use: No     Comment: has not drank in 37 years    No Known Allergies  Current Outpatient Prescriptions  Medication Sig Dispense Refill  . albuterol (PROVENTIL HFA;VENTOLIN HFA) 108 (90 Base) MCG/ACT inhaler Inhale 2 puffs into the lungs every 6 (six) hours as needed for wheezing or shortness of breath. 1 Inhaler 2  . aspirin EC 81 MG tablet Take 81 mg by mouth daily.    . famotidine (PEPCID) 10 MG tablet Take 10 mg by mouth 2 (two) times daily.     . fluticasone furoate-vilanterol (BREO ELLIPTA) 100-25 MCG/INH AEPB Inhale 1 puff into the lungs daily. (Patient taking differently: Inhale 1 puff into the lungs every morning. ) 60 each 5  . gabapentin (NEURONTIN) 100 MG capsule Take 100 mg by mouth 2 (two) times daily. AND PRN FOR ANY ADDITIONAL TREMORS    . lisinopril (PRINIVIL,ZESTRIL) 10 MG tablet Take 1 tablet (10 mg total) by mouth daily. (Patient taking differently: Take 10 mg by mouth every evening. ) 90 tablet 3  . naproxen sodium (ALEVE) 220 MG tablet Take 220 mg by mouth daily.    Marland Kitchen umeclidinium bromide (INCRUSE ELLIPTA) 62.5 MCG/INH AEPB Inhale 1 puff into the lungs daily. (Patient taking differently: Inhale 1 puff into the lungs every morning. ) 30 each 5  . metoprolol tartrate (LOPRESSOR) 25 MG tablet Take 0.5 tablets (12.5 mg total) by mouth 2 (two) times daily. 90 tablet 3   No current facility-administered medications for this visit.      Review of Systems A complete review of systems was asked and was negative except for the following positive findings 20 pound weight gain, occasional shortness of breath, heartburn, painful urination, arthritis, headache,  Blood pressure 127/70, pulse 67, temperature 98.4 F (36.9 C), temperature source Tympanic, resp. rate 18, height '5\' 9"'$  (1.753 m), weight 214 lb 1.1 oz (97.1 kg).  Physical Exam CONSTITUTIONAL:  Pleasant, well-developed, well-nourished, and in no acute  distress. EYES: Pupils equal and reactive to light, Sclera non-icteric EARS, NOSE, MOUTH AND THROAT:  The oropharynx was clear.  Dentition is good repair.  Oral mucosa pink and moist. LYMPH NODES:  Lymph nodes in the neck and axillae were normal RESPIRATORY:  Lungs were clear.  Normal respiratory effort without pathologic use of accessory muscles of respiration CARDIOVASCULAR: Heart was regular without murmurs.  There were no carotid bruits. GI: The abdomen was soft, nontender, and nondistended. There were no palpable masses. There was no hepatosplenomegaly. There were normal bowel sounds in all quadrants. GU:  Rectal deferred.   MUSCULOSKELETAL:  Normal muscle strength and tone.  No clubbing or cyanosis.   SKIN:  There were no pathologic skin lesions.  There were no nodules on palpation. NEUROLOGIC:  Sensation  is normal.  Cranial nerves are grossly intact. PSYCH:  Oriented to person, place and time.  Mood and affect are normal.  Data Reviewed   I have personally reviewed the patient's imaging, laboratory findings and medical records.    Assessment    Possible metastatic bladder cancer or primary lung cancers    Plan    I did explain to him the indications and risks of Port-A-Cath insertion. Risks of bleeding, infection, pneumothorax were reviewed. After extensive discussion with he and his wife they've elected to proceed. He will stop his Naprosyn. He will begin Tylenol as needed. All questions were answered.       [name], MD 08/05/2015, 12:18 PM

## 2015-08-05 NOTE — Progress Notes (Signed)
PDL-1 by IHC/keytruda- 11-20%.

## 2015-08-05 NOTE — Telephone Encounter (Signed)
Pt advised of pre op date/time and sx date. Sx: 08/10/15 with Dr Rolley Sims Placement. Pre op: 08/05/15 between 9-1:00pm--Phone.

## 2015-08-05 NOTE — Progress Notes (Signed)
Pt here for portacath placement.

## 2015-08-05 NOTE — Patient Instructions (Addendum)
  Your procedure is scheduled on: 08-10-15 Report to [place] To find out your arrival time please call [phone] between 1PM - 3PM on 08-09-15  Remember: Instructions that are not followed completely may result in serious medical risk, up to and including death, or upon the discretion of your surgeon and anesthesiologist your surgery may need to be rescheduled.    _X___ 1. Do not eat food or drink liquids after midnight. No gum chewing or hard candies.     _X___ 2. No Alcohol for 24 hours before or after surgery.   ____ 3. Bring all medications with you on the day of surgery if instructed.    ____ 4. Notify your doctor if there is any change in your medical condition     (cold, fever, infections).     Do not wear jewelry, make-up, hairpins, clips or nail polish.  Do not wear lotions, powders, or perfumes. You may wear deodorant.  Do not shave 48 hours prior to surgery. Men may shave face and neck.  Do not bring valuables to the hospital.    [facility] is not responsible for any belongings or valuables.               Contacts, dentures or bridgework may not be worn into surgery.  Leave your suitcase in the car. After surgery it may be brought to your room.  For patients admitted to the hospital, discharge time is determined by your treatment team.   Patients discharged the day of surgery will not be allowed to drive home.   Please read over the following fact sheets that you were given:      __X__ Take these medicines the morning of surgery with A SIP OF WATER:    1. PEPCID  2. METOPROLOL  3. GABAPENTIN  4.  5.  6.  ____ Fleet Enema (as directed)   ____ Use CHG Soap as directed  _X___ Use inhalers on the day of surgery-USE BREO ELLIPTA AND INCRUSE ELLIPTA AT [facility]  ____ Stop metformin 2 days prior to surgery    ____ Take 1/2 of usual insulin dose the night before surgery and none on the morning of  surgery.   ____ Stop Coumadin/Plavix/aspirin-PT STOPPED ASA ON 08-02-15  _X___ Stop Anti-inflammatories-STOP ALEVE NOW-NO NSAIDS OR ASA PRODUCTS-TYLENOL OK TO TAKE   ____ Stop supplements until after surgery.    ____ Bring C-Pap to the hospital.

## 2015-08-09 ENCOUNTER — Other Ambulatory Visit: Payer: Self-pay | Admitting: Cardiothoracic Surgery

## 2015-08-09 DIAGNOSIS — C3491 Malignant neoplasm of unspecified part of right bronchus or lung: Secondary | ICD-10-CM

## 2015-08-10 ENCOUNTER — Ambulatory Visit: Payer: Medicare Other | Admitting: Registered Nurse

## 2015-08-10 ENCOUNTER — Ambulatory Visit: Payer: Medicare Other

## 2015-08-10 ENCOUNTER — Encounter
Admission: RE | Disposition: A | Discharge status: Home / Self Care | Payer: Self-pay | Source: Ambulatory Visit | Attending: Cardiothoracic Surgery

## 2015-08-10 ENCOUNTER — Ambulatory Visit
Admission: RE | Admit: 2015-08-10 | Discharge: 2015-08-10 | Disposition: A | Discharge status: Home / Self Care | Payer: Medicare Other | Source: Ambulatory Visit | Attending: Cardiothoracic Surgery | Admitting: Cardiothoracic Surgery

## 2015-08-10 DIAGNOSIS — G8929 Other chronic pain: Secondary | ICD-10-CM | POA: Insufficient documentation

## 2015-08-10 DIAGNOSIS — Z8551 Personal history of malignant neoplasm of bladder: Secondary | ICD-10-CM | POA: Insufficient documentation

## 2015-08-10 DIAGNOSIS — Z820 Family history of epilepsy and other diseases of the nervous system: Secondary | ICD-10-CM | POA: Diagnosis not present

## 2015-08-10 DIAGNOSIS — Z823 Family history of stroke: Secondary | ICD-10-CM | POA: Insufficient documentation

## 2015-08-10 DIAGNOSIS — M545 Low back pain: Secondary | ICD-10-CM | POA: Insufficient documentation

## 2015-08-10 DIAGNOSIS — Z7951 Long term (current) use of inhaled steroids: Secondary | ICD-10-CM | POA: Diagnosis not present

## 2015-08-10 DIAGNOSIS — I10 Essential (primary) hypertension: Secondary | ICD-10-CM | POA: Insufficient documentation

## 2015-08-10 DIAGNOSIS — N400 Enlarged prostate without lower urinary tract symptoms: Secondary | ICD-10-CM | POA: Diagnosis not present

## 2015-08-10 DIAGNOSIS — C679 Malignant neoplasm of bladder, unspecified: Secondary | ICD-10-CM | POA: Diagnosis not present

## 2015-08-10 DIAGNOSIS — M1990 Unspecified osteoarthritis, unspecified site: Secondary | ICD-10-CM | POA: Diagnosis not present

## 2015-08-10 DIAGNOSIS — J449 Chronic obstructive pulmonary disease, unspecified: Secondary | ICD-10-CM | POA: Insufficient documentation

## 2015-08-10 DIAGNOSIS — I2510 Atherosclerotic heart disease of native coronary artery without angina pectoris: Secondary | ICD-10-CM | POA: Diagnosis not present

## 2015-08-10 DIAGNOSIS — G250 Essential tremor: Secondary | ICD-10-CM | POA: Diagnosis not present

## 2015-08-10 DIAGNOSIS — I739 Peripheral vascular disease, unspecified: Secondary | ICD-10-CM | POA: Diagnosis not present

## 2015-08-10 DIAGNOSIS — C3491 Malignant neoplasm of unspecified part of right bronchus or lung: Secondary | ICD-10-CM

## 2015-08-10 DIAGNOSIS — I4891 Unspecified atrial fibrillation: Secondary | ICD-10-CM | POA: Diagnosis not present

## 2015-08-10 DIAGNOSIS — Z8052 Family history of malignant neoplasm of bladder: Secondary | ICD-10-CM | POA: Insufficient documentation

## 2015-08-10 DIAGNOSIS — Z87891 Personal history of nicotine dependence: Secondary | ICD-10-CM | POA: Insufficient documentation

## 2015-08-10 DIAGNOSIS — R0602 Shortness of breath: Secondary | ICD-10-CM | POA: Insufficient documentation

## 2015-08-10 DIAGNOSIS — E785 Hyperlipidemia, unspecified: Secondary | ICD-10-CM | POA: Diagnosis not present

## 2015-08-10 DIAGNOSIS — Z7982 Long term (current) use of aspirin: Secondary | ICD-10-CM | POA: Insufficient documentation

## 2015-08-10 DIAGNOSIS — Z79899 Other long term (current) drug therapy: Secondary | ICD-10-CM | POA: Diagnosis not present

## 2015-08-10 DIAGNOSIS — R251 Tremor, unspecified: Secondary | ICD-10-CM | POA: Diagnosis not present

## 2015-08-10 DIAGNOSIS — C3411 Malignant neoplasm of upper lobe, right bronchus or lung: Secondary | ICD-10-CM | POA: Insufficient documentation

## 2015-08-10 DIAGNOSIS — F1010 Alcohol abuse, uncomplicated: Secondary | ICD-10-CM | POA: Diagnosis not present

## 2015-08-10 DIAGNOSIS — Z86010 Personal history of colonic polyps: Secondary | ICD-10-CM | POA: Diagnosis not present

## 2015-08-10 DIAGNOSIS — Z9849 Cataract extraction status, unspecified eye: Secondary | ICD-10-CM | POA: Diagnosis not present

## 2015-08-10 DIAGNOSIS — K219 Gastro-esophageal reflux disease without esophagitis: Secondary | ICD-10-CM | POA: Diagnosis not present

## 2015-08-10 DIAGNOSIS — Z09 Encounter for follow-up examination after completed treatment for conditions other than malignant neoplasm: Secondary | ICD-10-CM

## 2015-08-10 DIAGNOSIS — Z8049 Family history of malignant neoplasm of other genital organs: Secondary | ICD-10-CM | POA: Diagnosis not present

## 2015-08-10 DIAGNOSIS — Z452 Encounter for adjustment and management of vascular access device: Secondary | ICD-10-CM | POA: Diagnosis not present

## 2015-08-10 HISTORY — PX: PORTACATH PLACEMENT: SHX2246

## 2015-08-10 SURGERY — INSERTION, TUNNELED CENTRAL VENOUS DEVICE, WITH PORT
Anesthesia: General

## 2015-08-10 MED ORDER — LACTATED RINGERS IV SOLN
INTRAVENOUS | Status: DC
  Administered 2015-08-10 (×2): via INTRAVENOUS

## 2015-08-10 MED ORDER — LIDOCAINE HCL 2 % EX GEL
CUTANEOUS | Status: DC | PRN
  Administered 2015-08-10: 1 via TOPICAL

## 2015-08-10 MED ORDER — GLYCOPYRROLATE 0.2 MG/ML IJ SOLN
INTRAMUSCULAR | Status: DC | PRN
  Administered 2015-08-10: 0.2 mg via INTRAVENOUS

## 2015-08-10 MED ORDER — SODIUM CHLORIDE 0.9 % IV SOLN
INTRAVENOUS | Status: DC | PRN
  Administered 2015-08-10: 6 mL via INTRAMUSCULAR

## 2015-08-10 MED ORDER — FENTANYL CITRATE (PF) 100 MCG/2ML IJ SOLN
INTRAMUSCULAR | Status: DC | PRN
  Administered 2015-08-10: 50 ug via INTRAVENOUS
  Administered 2015-08-10 (×2): 25 ug via INTRAVENOUS

## 2015-08-10 MED ORDER — LIDOCAINE HCL 1 % IJ SOLN
INTRAMUSCULAR | Status: DC | PRN
  Administered 2015-08-10: 7 mL

## 2015-08-10 MED ORDER — HEPARIN SODIUM (PORCINE) 5000 UNIT/ML IJ SOLN
INTRAMUSCULAR | Status: AC
  Filled 2015-08-10: qty 1

## 2015-08-10 MED ORDER — LIDOCAINE HCL (CARDIAC) 20 MG/ML IV SOLN
INTRAVENOUS | Status: DC | PRN
  Administered 2015-08-10: 100 mg via INTRAVENOUS

## 2015-08-10 MED ORDER — FENTANYL CITRATE (PF) 100 MCG/2ML IJ SOLN: 25.0000 ug | INTRAMUSCULAR | Status: DC | PRN

## 2015-08-10 MED ORDER — LIDOCAINE HCL (PF) 1 % IJ SOLN
INTRAMUSCULAR | Status: AC
  Filled 2015-08-10: qty 30

## 2015-08-10 MED ORDER — PROPOFOL 10 MG/ML IV BOLUS
INTRAVENOUS | Status: DC | PRN
  Administered 2015-08-10: 190 mg via INTRAVENOUS

## 2015-08-10 MED ORDER — ONDANSETRON HCL 4 MG/2ML IJ SOLN
INTRAMUSCULAR | Status: DC | PRN
  Administered 2015-08-10: 4 mg via INTRAVENOUS

## 2015-08-10 MED ORDER — PHENYLEPHRINE HCL 10 MG/ML IJ SOLN
INTRAMUSCULAR | Status: DC | PRN
  Administered 2015-08-10: 150 ug via INTRAVENOUS
  Administered 2015-08-10: 100 ug via INTRAVENOUS
  Administered 2015-08-10: 150 ug via INTRAVENOUS

## 2015-08-10 MED ORDER — DEXTROSE 5 % IV SOLN
1.5000 g | INTRAVENOUS | Status: AC
  Administered 2015-08-10: 1.5 g via INTRAVENOUS
  Filled 2015-08-10: qty 1.5

## 2015-08-10 MED ORDER — ONDANSETRON HCL 4 MG/2ML IJ SOLN: 4.0000 mg | Freq: Once | INTRAMUSCULAR | Status: DC | PRN

## 2015-08-10 MED ORDER — MIDAZOLAM HCL 2 MG/2ML IJ SOLN
INTRAMUSCULAR | Status: DC | PRN
  Administered 2015-08-10: 2 mg via INTRAVENOUS

## 2015-08-10 SURGICAL SUPPLY — 35 items
BAG DECANTER FOR FLEXI CONT (MISCELLANEOUS) ×2 IMPLANT
BLADE SURG SZ11 CARB STEEL (BLADE) ×2 IMPLANT
CANISTER SUCT 1200ML W/VALVE (MISCELLANEOUS) ×2 IMPLANT
CHLORAPREP W/TINT 26ML (MISCELLANEOUS) ×4 IMPLANT
COVER LIGHT HANDLE STERIS (MISCELLANEOUS) ×4 IMPLANT
DRAPE C-ARM XRAY 36X54 (DRAPES) ×2 IMPLANT
DRESSING TELFA 4X3 1S ST N-ADH (GAUZE/BANDAGES/DRESSINGS) ×2 IMPLANT
DRSG TEGADERM 2-3/8X2-3/4 SM (GAUZE/BANDAGES/DRESSINGS) ×2 IMPLANT
DRSG TEGADERM 4X4.75 (GAUZE/BANDAGES/DRESSINGS) ×2 IMPLANT
ELECT CAUTERY BLADE TIP 2.5 (TIP) ×2
ELECT REM PT RETURN 9FT ADLT (ELECTROSURGICAL) ×2
ELECTRODE CAUTERY BLDE TIP 2.5 (TIP) ×1 IMPLANT
ELECTRODE REM PT RTRN 9FT ADLT (ELECTROSURGICAL) ×1 IMPLANT
GLOVE EXAM LX STRL 7.5 (GLOVE) ×6 IMPLANT
GOWN STRL REUS W/ TWL LRG LVL3 (GOWN DISPOSABLE) ×2 IMPLANT
GOWN STRL REUS W/TWL LRG LVL3 (GOWN DISPOSABLE) ×2
IV NS 500ML (IV SOLUTION) ×1
IV NS 500ML BAXH (IV SOLUTION) ×1 IMPLANT
KIT PORT POWER 8FR ISP CVUE (Catheter) ×2 IMPLANT
KIT RM TURNOVER STRD PROC AR (KITS) ×2 IMPLANT
LABEL OR SOLS (LABEL) ×2 IMPLANT
MARKER SKIN DUAL TIP RULER LAB (MISCELLANEOUS) IMPLANT
NDL SAFETY 22GX1.5 (NEEDLE) ×2 IMPLANT
NEEDLE FILTER BLUNT 18X 1/2SAF (NEEDLE)
NEEDLE FILTER BLUNT 18X1 1/2 (NEEDLE) IMPLANT
NS IRRIG 500ML POUR BTL (IV SOLUTION) ×2 IMPLANT
PACK PORT-A-CATH (MISCELLANEOUS) ×2 IMPLANT
SUT ETHILON 4-0 (SUTURE) ×1
SUT ETHILON 4-0 FS2 18XMFL BLK (SUTURE) ×1
SUT PROLENE 2 0 SH DA (SUTURE) ×4 IMPLANT
SUT VIC AB 3-0 SH 27 (SUTURE) ×1
SUT VIC AB 3-0 SH 27X BRD (SUTURE) ×1 IMPLANT
SUTURE ETHLN 4-0 FS2 18XMF BLK (SUTURE) ×1 IMPLANT
SYR 3ML LL SCALE MARK (SYRINGE) IMPLANT
SYRINGE 10CC LL (SYRINGE) ×4 IMPLANT

## 2015-08-10 NOTE — Discharge Instructions (Signed)
AMBULATORY SURGERY  DISCHARGE INSTRUCTIONS   1) The drugs that you were given will stay in your system until tomorrow so for the next 24 hours you should not:  A) Drive an automobile B) Make any legal decisions C) Drink any alcoholic beverage   2) You may resume regular meals tomorrow.  Today it is better to start with liquids and gradually work up to solid foods.  You may eat anything you prefer, but it is better to start with liquids, then soup and crackers, and gradually work up to solid foods.   3) Please notify your doctor immediately if you have any unusual bleeding, trouble breathing, redness and pain at the surgery site, drainage, fever, or pain not relieved by medication.    4) Additional Instructions:        Please contact your physician with any problems or Same Day Surgery at [phone], Monday through Friday 6 am to 4 pm, or [place] at [facility] number at [phone].Implanted [facility] Guide An implanted port is a type of central line that is placed under the skin. Central lines are used to provide IV access when treatment or nutrition needs to be given through a person's veins. Implanted ports are used for long-term IV access. An implanted port may be placed because:   You need IV medicine that would be irritating to the small veins in your hands or arms.   You need long-term IV medicines, such as antibiotics.   You need IV nutrition for a long period.   You need frequent blood draws for lab tests.   You need dialysis.  Implanted ports are usually placed in the chest area, but they can also be placed in the upper arm, the abdomen, or the leg. An implanted port has two main parts:   Reservoir. The reservoir is round and will appear as a small, raised area under your skin. The reservoir is the part where a needle is inserted to give medicines or draw blood.   Catheter. The catheter is a thin, flexible tube that extends from the reservoir. The  catheter is placed into a large vein. Medicine that is inserted into the reservoir goes into the catheter and then into the vein.  HOW WILL I CARE FOR MY INCISION SITE? Do not get the incision site wet. Bathe or shower as directed by your health care provider.  HOW IS MY PORT ACCESSED? Special steps must be taken to access the port:   Before the port is accessed, a numbing cream can be placed on the skin. This helps numb the skin over the port site.   Your health care provider uses a sterile technique to access the port.  Your health care provider must put on a mask and sterile gloves.  The skin over your port is cleaned carefully with an antiseptic and allowed to dry.  The port is gently pinched between sterile gloves, and a needle is inserted into the port.  Only "non-coring" port needles should be used to access the port. Once the port is accessed, a blood return should be checked. This helps ensure that the port is in the vein and is not clogged.   If your port needs to remain accessed for a constant infusion, a clear (transparent) bandage will be placed over the needle site. The bandage and needle will need to be changed every week, or as directed by your health care provider.   Keep the bandage covering the needle clean and dry.  Do not get it wet. Follow your health care provider's instructions on how to take a shower or bath while the port is accessed.   If your port does not need to stay accessed, no bandage is needed over the port.  WHAT IS FLUSHING? Flushing helps keep the port from getting clogged. Follow your health care provider's instructions on how and when to flush the port. Ports are usually flushed with saline solution or a medicine called heparin. The need for flushing will depend on how the port is used.   If the port is used for intermittent medicines or blood draws, the port will need to be flushed:   After medicines have been given.   After blood has been  drawn.   As part of routine maintenance.   If a constant infusion is running, the port may not need to be flushed.  HOW LONG WILL MY PORT STAY IMPLANTED? The port can stay in for as long as your health care provider thinks it is needed. When it is time for the port to come out, surgery will be done to remove it. The procedure is similar to the one performed when the port was put in.  WHEN SHOULD I SEEK IMMEDIATE MEDICAL CARE? When you have an implanted port, you should seek immediate medical care if:   You notice a bad smell coming from the incision site.   You have swelling, redness, or drainage at the incision site.   You have more swelling or pain at the port site or the surrounding area.   You have a fever that is not controlled with medicine.   This information is not intended to replace advice given to you by your health care provider. Make sure you discuss any questions you have with your health care provider.   Document Released: 05/28/2005 Document Revised: 03/18/2013 Document Reviewed: 02/02/2013 Elsevier Interactive Patient Education [employer].

## 2015-08-10 NOTE — Anesthesia Procedure Notes (Addendum)
Procedure Name: LMA Insertion Date/Time: 08/10/2015 9:06 AM Performed by: Doreen Salvage Pre-anesthesia Checklist: Patient identified, Patient being monitored, Timeout performed, Emergency Drugs available and Suction available Patient Re-evaluated:Patient Re-evaluated prior to inductionOxygen Delivery Method: Circle system utilized Preoxygenation: Pre-oxygenation with 100% oxygen Intubation Type: IV induction Ventilation: Mask ventilation without difficulty LMA: LMA inserted LMA Size: 4.5 Tube type: Oral Number of attempts: 1 Placement Confirmation: positive ETCO2 and breath sounds checked- equal and bilateral Tube secured with: Tape Dental Injury: Teeth and Oropharynx as per pre-operative assessment    Procedure Name: LMA Insertion Date/Time: 08/10/2015 9:12 AM Performed by: Doreen Salvage Pre-anesthesia Checklist: Patient identified, Patient being monitored, Timeout performed, Emergency Drugs available and Suction available Patient Re-evaluated:Patient Re-evaluated prior to inductionOxygen Delivery Method: Circle system utilized Preoxygenation: Pre-oxygenation with 100% oxygen Intubation Type: IV induction Ventilation: Mask ventilation without difficulty LMA: LMA inserted LMA Size: 5.0 Tube type: Oral Number of attempts: 1 Placement Confirmation: positive ETCO2 and breath sounds checked- equal and bilateral Tube secured with: Tape Dental Injury: Teeth and Oropharynx as per pre-operative assessment

## 2015-08-10 NOTE — H&P (View-Only) (Signed)
Patient ID: Joseph Graham, male   DOB: [birth year], 75 y.o.   MRN: [id number]  Chief Complaint  Patient presents with  . Vascular Access Problem    Referred By Dr. Ephraim Hamburger Reason for Referral Port-A-Cath  HPI Location, Quality, Duration, Severity, Timing, Context, Modifying Factors, Associated Signs and Symptoms.  Joseph Graham is a 75 y.o. male.  This patient is a 75 year old white male with a prior history of bladder tumor and lung mass. He underwent a CT-guided needle biopsy of his lung and was found to have metastatic tumor. A complete workup has not been completed and he requires a Port-A-Cath for chemotherapy. He presents today after discussion with his oncologist about the indications and risks of chemotherapy.  [name] is done well since his chest tube on the right after his pneumothorax. He does not currently complain of any significant shortness of breath. He states that he only gets short of breath with exertion but he is otherwise well. He does take Naprosyn for arthritic changes in his neck. He has a past history of narcotic dependency and does not wish to take any narcotics. He he will try Tylenol for his arthritic complaints since I've asked him to stop his Naprosyn.   Past Medical History  Diagnosis Date  . Colon polyps     next colonoscopy due around 2018  . HTN (hypertension)   . Alcohol abuse, in remission     remote  . Benign essential tremor     improved on metoprolol and gabapentin  . Dyslipidemia     mild off meds  . Chronic low back pain     MRI 2004, multilevel DDD  . BPH (benign prostatic hypertrophy) 12/30/2012  . Personal history of tobacco use, presenting hazards to health 02/18/2015    quit 06/2011, restarted 2014  . Multiple pulmonary nodules 02/2015  . COPD (chronic obstructive pulmonary disease) ([place])   . GERD (gastroesophageal reflux disease)   . Arthritis     BACK AND NECK  . Shortness of breath dyspnea     OCC WITH EXERTION  . Cardiac  arrhythmia 03/2010    h/o a flutter and CM, s/p ablation, normal stress test  . Primary cancer of bladder ([place]) 2016    Joseph Graham  . Primary lung cancer ([place]) 2016  . Complication of anesthesia     DID NOT GET COMPLETELY NUMB WITH TONSILLECTOMY    Past Surgical History  Procedure Laterality Date  . Hemorroidectomy  1982  . A flutter ablation  03/2010  . Cataract extraction    . Carotid US  03/2010    WNL  . Colonoscopy  09/2011    polyps, rpt due 5 yrs, mild diverticulosis [name])  . Tonsillectomy  1964  . Electromagnetic navigation brochoscopy N/A 03/02/2015    Procedure: ELECTROMAGNETIC NAVIGATION BRONCHOSCOPY;  Surgeon: [name], MD;  Location: [facility] OR[facility];  Service: Cardiopulmonary;  Laterality: N/A;  . Transurethral resection of bladder tumor N/A 04/06/2015    Procedure: TRANSURETHRAL RESECTION OF BLADDER TUMOR (TURBT) right ureteral stent placement;  Surgeon: [name], MD;  Location: [facility] ORS;  Service: Urology;  Laterality: N/A;  . Cystoscopy w/ retrogrades Bilateral 04/06/2015    Procedure: CYSTOSCOPY WITH RETROGRADE PYELOGRAM;  Surgeon: [name], MD;  Location: [facility];  Service: Urology;  Laterality: Bilateral;  . Urinary stent removal  04/14/15  . Transurethral resection of bladder tumor N/A 07/13/2015    Procedure: TRANSURETHRAL RESECTION OF BLADDER TUMOR (TURBT);  Surgeon: [name],  MD;  Location: [facility];  Service: Urology;  Laterality: N/A;    Family History  Problem Relation Age of Onset  . Cervical cancer Mother 43  . Hypertension Father   . Stroke Brother 73  . Tremor Father     and several uncles/aunts (not parkinson's)  . Bladder Cancer Brother 30  . Prostate cancer Neg Hx   . Kidney cancer Neg Hx     Social History Social History  Substance Use Topics  . Smoking status: Former Smoker -- 1.50 packs/day for 61 years    Types: Cigarettes    Quit date: 03/08/2015  . Smokeless tobacco: Never Used     Comment: cut back 0.5  cigarettes--stopped 03/07/15; now now chantix  . Alcohol Use: No     Comment: has not drank in 37 years    No Known Allergies  Current Outpatient Prescriptions  Medication Sig Dispense Refill  . albuterol (PROVENTIL HFA;VENTOLIN HFA) 108 (90 Base) MCG/ACT inhaler Inhale 2 puffs into the lungs every 6 (six) hours as needed for wheezing or shortness of breath. 1 Inhaler 2  . aspirin EC 81 MG tablet Take 81 mg by mouth daily.    . famotidine (PEPCID) 10 MG tablet Take 10 mg by mouth 2 (two) times daily.     . fluticasone furoate-vilanterol (BREO ELLIPTA) 100-25 MCG/INH AEPB Inhale 1 puff into the lungs daily. (Patient taking differently: Inhale 1 puff into the lungs every morning. ) 60 each 5  . gabapentin (NEURONTIN) 100 MG capsule Take 100 mg by mouth 2 (two) times daily. AND PRN FOR ANY ADDITIONAL TREMORS    . lisinopril (PRINIVIL,ZESTRIL) 10 MG tablet Take 1 tablet (10 mg total) by mouth daily. (Patient taking differently: Take 10 mg by mouth every evening. ) 90 tablet 3  . naproxen sodium (ALEVE) 220 MG tablet Take 220 mg by mouth daily.    Marland Kitchen umeclidinium bromide (INCRUSE ELLIPTA) 62.5 MCG/INH AEPB Inhale 1 puff into the lungs daily. (Patient taking differently: Inhale 1 puff into the lungs every morning. ) 30 each 5  . metoprolol tartrate (LOPRESSOR) 25 MG tablet Take 0.5 tablets (12.5 mg total) by mouth 2 (two) times daily. 90 tablet 3   No current facility-administered medications for this visit.      Review of Systems A complete review of systems was asked and was negative except for the following positive findings 20 pound weight gain, occasional shortness of breath, heartburn, painful urination, arthritis, headache,  Blood pressure 127/70, pulse 67, temperature 98.4 F (36.9 C), temperature source Tympanic, resp. rate 18, height '5\' 9"'$  (1.753 m), weight 214 lb 1.1 oz (97.1 kg).  Physical Exam CONSTITUTIONAL:  Pleasant, well-developed, well-nourished, and in no acute  distress. EYES: Pupils equal and reactive to light, Sclera non-icteric EARS, NOSE, MOUTH AND THROAT:  The oropharynx was clear.  Dentition is good repair.  Oral mucosa pink and moist. LYMPH NODES:  Lymph nodes in the neck and axillae were normal RESPIRATORY:  Lungs were clear.  Normal respiratory effort without pathologic use of accessory muscles of respiration CARDIOVASCULAR: Heart was regular without murmurs.  There were no carotid bruits. GI: The abdomen was soft, nontender, and nondistended. There were no palpable masses. There was no hepatosplenomegaly. There were normal bowel sounds in all quadrants. GU:  Rectal deferred.   MUSCULOSKELETAL:  Normal muscle strength and tone.  No clubbing or cyanosis.   SKIN:  There were no pathologic skin lesions.  There were no nodules on palpation. NEUROLOGIC:  Sensation  is normal.  Cranial nerves are grossly intact. PSYCH:  Oriented to person, place and time.  Mood and affect are normal.  Data Reviewed   I have personally reviewed the patient's imaging, laboratory findings and medical records.    Assessment    Possible metastatic bladder cancer or primary lung cancers    Plan    I did explain to him the indications and risks of Port-A-Cath insertion. Risks of bleeding, infection, pneumothorax were reviewed. After extensive discussion with he and his wife they've elected to proceed. He will stop his Naprosyn. He will begin Tylenol as needed. All questions were answered.       [name], MD 08/05/2015, 12:18 PM

## 2015-08-10 NOTE — Transfer of Care (Signed)
Immediate Anesthesia Transfer of Care Note  Patient: Joseph Graham  Procedure(s) Performed: Procedure(s): INSERTION PORT-A-CATH (N/A)  Patient Location: PACU  Anesthesia Type:General  Level of Consciousness: sedated  Airway & Oxygen Therapy: Patient Spontanous Breathing and Patient connected to face mask oxygen  Post-op Assessment: Report given to RN and Post -op Vital signs reviewed and stable  Post vital signs: Reviewed and stable  Last Vitals:  Filed Vitals:   08/10/15 0743 08/10/15 1036  BP: 145/64 114/59  Pulse: 66 65  Temp: 36.4 C 36.4 C  Resp: 16 14    Complications: No apparent anesthesia complications

## 2015-08-10 NOTE — Anesthesia Postprocedure Evaluation (Signed)
Anesthesia Post Note  Patient: Joseph Graham  Procedure(s) Performed: Procedure(s) (LRB): INSERTION PORT-A-CATH (N/A)  Patient location during evaluation: PACU Anesthesia Type: General Level of consciousness: awake and alert, oriented and patient cooperative Pain management: pain level controlled Vital Signs Assessment: post-procedure vital signs reviewed and stable Respiratory status: spontaneous breathing Cardiovascular status: blood pressure returned to baseline Anesthetic complications: no    Last Vitals:  Filed Vitals:   08/10/15 1123 08/10/15 1150  BP: 114/60 127/53  Pulse: 61 58  Temp: 36.6 C 36.6 C  Resp: 14 14    Last Pain:  Filed Vitals:   08/10/15 1152  PainSc: 0-No pain                 Willman Cuny

## 2015-08-10 NOTE — Anesthesia Preprocedure Evaluation (Signed)
Anesthesia Evaluation  Patient identified by MRN, date of birth, ID band Patient awake  General Assessment Comment:Patient denies issues with anesthesia  Reviewed: Allergy & Precautions, NPO status , Patient's Chart, lab work & pertinent test results  Airway Mallampati: II  TM Distance: >3 FB Neck ROM: Full    Dental  (+) Poor Dentition, Chipped   Pulmonary shortness of breath and with exertion, COPD,  COPD inhaler, former smoker,  Lung nodules   Pulmonary exam normal breath sounds clear to auscultation       Cardiovascular hypertension, Pt. on medications + CAD and + Peripheral Vascular Disease  Normal cardiovascular exam+ dysrhythmias Atrial Fibrillation   Hx of A flutter in the past, but OK after ablation   Neuro/Psych negative neurological ROS  negative psych ROS   GI/Hepatic GERD  Medicated and Controlled,(+)     substance abuse  alcohol use, Colon polyps   Endo/Other  negative endocrine ROS  Renal/GU negative Renal ROS   Bladder CA    Musculoskeletal  (+) Arthritis , Osteoarthritis,  Chronic low back pain   Abdominal Normal abdominal exam  (+)   Peds negative pediatric ROS (+)  Hematology negative hematology ROS (+)   Anesthesia Other Findings Bladder CA and Lung CA.... BPH  Reproductive/Obstetrics                             Anesthesia Physical Anesthesia Plan  ASA: III  Anesthesia Plan: General   Post-op Pain Management:    Induction: Intravenous  Airway Management Planned: LMA  Additional Equipment:   Intra-op Plan:   Post-operative Plan: Extubation in OR  Informed Consent: I have reviewed the patients History and Physical, chart, labs and discussed the procedure including the risks, benefits and alternatives for the proposed anesthesia with the patient or authorized representative who has indicated his/her understanding and acceptance.   Dental advisory  given  Plan Discussed with: CRNA and Surgeon  Anesthesia Plan Comments:         Anesthesia Quick Evaluation

## 2015-08-10 NOTE — Interval H&P Note (Signed)
History and Physical Interval Note:  08/10/2015 8:51 AM  Joseph Graham  has presented today for surgery, with the diagnosis of MALIGNANT NEOPLASM OF UPPER LOBE RIGHT LUNG  The various methods of treatment have been discussed with the patient and family. After consideration of risks, benefits and other options for treatment, the patient has consented to  Procedure(s): INSERTION PORT-A-CATH (N/A) as a surgical intervention .  The patient's history has been reviewed, patient examined, no change in status, stable for surgery.  I have reviewed the patient's chart and labs.  Questions were answered to the patient's satisfaction.     [name]

## 2015-08-10 NOTE — Op Note (Signed)
08/10/2015  10:50 AM  PATIENT:  Joseph Graham  75 y.o. male  PRE-OPERATIVE DIAGNOSIS:  MALIGNANT NEOPLASM OF UPPER LOBE RIGHT LUNG  POST-OPERATIVE DIAGNOSIS:  same  PROCEDURE:  Procedure(s): INSERTION PORT-A-CATH (N/A)  SURGEON:  Surgeon(s) and Role:    * [name], MD - Primary  ASSISTANTS: None   ANESTHESIA:   LMA  DICTATION:   The patient was brought to the operating suite and placed in the supine position. Using the ultrasound machine the right internal jugular vein was identified and suitable for cannulation. The patient was then prepped and draped in usual sterile fashion. The right internal jugular vein was percutaneously catheterized. A wire was placed into the venous system under fluoroscopic guidance. An appropriate site was selected on the chest wall and a Port-A-Cath pocket was created. The catheter was tunneled from the port site up to the insertion site. The catheter was then inserted through a peel-away sheath and positioned at the appropriate level in the superior vena cava. The catheter was then assembled and aspirated and flushed easily. It was then secured to the anterior chest wall with interrupted Prolene sutures. The catheter was flushed one last time and the wounds were then closed. The subcutaneous tissues were closed with running absorbable sutures and the skin with nylon. Sterile dressings were applied. Patient was then transported to the recovery room in stable condition.   [name], MD

## 2015-08-11 ENCOUNTER — Inpatient Hospital Stay: Payer: Medicare Other | Attending: Internal Medicine

## 2015-08-11 ENCOUNTER — Inpatient Hospital Stay: Payer: Medicare Other

## 2015-08-11 ENCOUNTER — Inpatient Hospital Stay (HOSPITAL_BASED_OUTPATIENT_CLINIC_OR_DEPARTMENT_OTHER): Payer: Medicare Other | Admitting: Internal Medicine

## 2015-08-11 ENCOUNTER — Encounter: Payer: Self-pay | Admitting: Internal Medicine

## 2015-08-11 VITALS — BP 147/70 | HR 79 | Temp 98.7°F | Resp 18 | Ht 69.0 in | Wt 217.6 lb

## 2015-08-11 DIAGNOSIS — G250 Essential tremor: Secondary | ICD-10-CM | POA: Insufficient documentation

## 2015-08-11 DIAGNOSIS — C3411 Malignant neoplasm of upper lobe, right bronchus or lung: Secondary | ICD-10-CM

## 2015-08-11 DIAGNOSIS — R0602 Shortness of breath: Secondary | ICD-10-CM | POA: Insufficient documentation

## 2015-08-11 DIAGNOSIS — M1990 Unspecified osteoarthritis, unspecified site: Secondary | ICD-10-CM | POA: Diagnosis not present

## 2015-08-11 DIAGNOSIS — Z79899 Other long term (current) drug therapy: Secondary | ICD-10-CM

## 2015-08-11 DIAGNOSIS — K219 Gastro-esophageal reflux disease without esophagitis: Secondary | ICD-10-CM | POA: Insufficient documentation

## 2015-08-11 DIAGNOSIS — R918 Other nonspecific abnormal finding of lung field: Secondary | ICD-10-CM | POA: Insufficient documentation

## 2015-08-11 DIAGNOSIS — I499 Cardiac arrhythmia, unspecified: Secondary | ICD-10-CM | POA: Diagnosis not present

## 2015-08-11 DIAGNOSIS — G8929 Other chronic pain: Secondary | ICD-10-CM

## 2015-08-11 DIAGNOSIS — Z87891 Personal history of nicotine dependence: Secondary | ICD-10-CM | POA: Diagnosis not present

## 2015-08-11 DIAGNOSIS — M2550 Pain in unspecified joint: Secondary | ICD-10-CM | POA: Insufficient documentation

## 2015-08-11 DIAGNOSIS — Z8551 Personal history of malignant neoplasm of bladder: Secondary | ICD-10-CM | POA: Diagnosis not present

## 2015-08-11 DIAGNOSIS — Z7982 Long term (current) use of aspirin: Secondary | ICD-10-CM | POA: Diagnosis not present

## 2015-08-11 DIAGNOSIS — M545 Low back pain: Secondary | ICD-10-CM | POA: Diagnosis not present

## 2015-08-11 DIAGNOSIS — Z86010 Personal history of colonic polyps: Secondary | ICD-10-CM

## 2015-08-11 DIAGNOSIS — N400 Enlarged prostate without lower urinary tract symptoms: Secondary | ICD-10-CM | POA: Insufficient documentation

## 2015-08-11 DIAGNOSIS — Z5111 Encounter for antineoplastic chemotherapy: Secondary | ICD-10-CM | POA: Diagnosis not present

## 2015-08-11 DIAGNOSIS — E785 Hyperlipidemia, unspecified: Secondary | ICD-10-CM | POA: Insufficient documentation

## 2015-08-11 DIAGNOSIS — J449 Chronic obstructive pulmonary disease, unspecified: Secondary | ICD-10-CM

## 2015-08-11 DIAGNOSIS — Z8052 Family history of malignant neoplasm of bladder: Secondary | ICD-10-CM | POA: Diagnosis not present

## 2015-08-11 DIAGNOSIS — I10 Essential (primary) hypertension: Secondary | ICD-10-CM | POA: Insufficient documentation

## 2015-08-11 DIAGNOSIS — Z808 Family history of malignant neoplasm of other organs or systems: Secondary | ICD-10-CM

## 2015-08-11 DIAGNOSIS — R1013 Epigastric pain: Secondary | ICD-10-CM | POA: Diagnosis not present

## 2015-08-11 DIAGNOSIS — C3491 Malignant neoplasm of unspecified part of right bronchus or lung: Secondary | ICD-10-CM

## 2015-08-11 LAB — COMPREHENSIVE METABOLIC PANEL
ALT: 26 U/L (ref 17–63)
AST: 22 U/L (ref 15–41)
Albumin: 3.7 g/dL (ref 3.5–5.0)
Alkaline Phosphatase: 95 U/L (ref 38–126)
Anion gap: 5 (ref 5–15)
BILIRUBIN TOTAL: 0.4 mg/dL (ref 0.3–1.2)
BUN: 26 mg/dL — AB (ref 6–20)
CALCIUM: 8.4 mg/dL — AB (ref 8.9–10.3)
CHLORIDE: 100 mmol/L — AB (ref 101–111)
CO2: 28 mmol/L (ref 22–32)
CREATININE: 0.91 mg/dL (ref 0.61–1.24)
Glucose, Bld: 161 mg/dL — ABNORMAL HIGH (ref 65–99)
Potassium: 3.9 mmol/L (ref 3.5–5.1)
Sodium: 133 mmol/L — ABNORMAL LOW (ref 135–145)
TOTAL PROTEIN: 7.7 g/dL (ref 6.5–8.1)

## 2015-08-11 LAB — CBC WITH DIFFERENTIAL/PLATELET
Basophils Absolute: 0.1 10*3/uL (ref 0–0.1)
Basophils Relative: 1 %
EOS PCT: 4 %
Eosinophils Absolute: 0.3 10*3/uL (ref 0–0.7)
HEMATOCRIT: 36.4 % — AB (ref 40.0–52.0)
Hemoglobin: 12.5 g/dL — ABNORMAL LOW (ref 13.0–18.0)
LYMPHS ABS: 0.8 10*3/uL — AB (ref 1.0–3.6)
LYMPHS PCT: 10 %
MCH: 32 pg (ref 26.0–34.0)
MCHC: 34.4 g/dL (ref 32.0–36.0)
MCV: 93 fL (ref 80.0–100.0)
MONO ABS: 0.7 10*3/uL (ref 0.2–1.0)
Monocytes Relative: 8 %
NEUTROS ABS: 6.6 10*3/uL — AB (ref 1.4–6.5)
Neutrophils Relative %: 77 %
PLATELETS: 229 10*3/uL (ref 150–440)
RBC: 3.91 MIL/uL — AB (ref 4.40–5.90)
RDW: 13.8 % (ref 11.5–14.5)
WBC: 8.4 10*3/uL (ref 3.8–10.6)

## 2015-08-11 MED ORDER — SODIUM CHLORIDE 0.9 % IV SOLN
Freq: Once | INTRAVENOUS | Status: AC
  Administered 2015-08-11: 11:00:00 via INTRAVENOUS
  Filled 2015-08-11: qty 1000

## 2015-08-11 MED ORDER — ONDANSETRON HCL 8 MG PO TABS: 8.0000 mg | ORAL_TABLET | Freq: Three times a day (TID) | ORAL | Status: DC | PRN

## 2015-08-11 MED ORDER — PALONOSETRON HCL INJECTION 0.25 MG/5ML
0.2500 mg | Freq: Once | INTRAVENOUS | Status: AC
Start: 2015-08-11 — End: 2015-08-11
  Administered 2015-08-11: 0.25 mg via INTRAVENOUS
  Filled 2015-08-11: qty 5

## 2015-08-11 MED ORDER — FAMOTIDINE IN NACL 20-0.9 MG/50ML-% IV SOLN
20.0000 mg | Freq: Once | INTRAVENOUS | Status: AC
  Administered 2015-08-11: 20 mg via INTRAVENOUS
  Filled 2015-08-11: qty 50

## 2015-08-11 MED ORDER — SODIUM CHLORIDE 0.9 % IV SOLN
683.4000 mg | Freq: Once | INTRAVENOUS | Status: AC
  Administered 2015-08-11: 680 mg via INTRAVENOUS
  Filled 2015-08-11: qty 68

## 2015-08-11 MED ORDER — SODIUM CHLORIDE 0.9 % IV SOLN
200.0000 mg/m2 | Freq: Once | INTRAVENOUS | Status: AC
  Administered 2015-08-11: 438 mg via INTRAVENOUS
  Filled 2015-08-11: qty 73

## 2015-08-11 MED ORDER — DIPHENHYDRAMINE HCL 50 MG/ML IJ SOLN
50.0000 mg | Freq: Once | INTRAMUSCULAR | Status: AC
  Administered 2015-08-11: 50 mg via INTRAVENOUS
  Filled 2015-08-11: qty 1

## 2015-08-11 MED ORDER — PEGFILGRASTIM 6 MG/0.6ML ~~LOC~~ PSKT
6.0000 mg | PREFILLED_SYRINGE | Freq: Once | SUBCUTANEOUS | Status: AC
  Administered 2015-08-11: 6 mg via SUBCUTANEOUS
  Filled 2015-08-11: qty 0.6

## 2015-08-11 MED ORDER — PROCHLORPERAZINE MALEATE 10 MG PO TABS: 10.0000 mg | ORAL_TABLET | Freq: Four times a day (QID) | ORAL | Status: DC | PRN

## 2015-08-11 MED ORDER — SODIUM CHLORIDE 0.9 % IV SOLN
20.0000 mg | Freq: Once | INTRAVENOUS | Status: AC
  Administered 2015-08-11: 20 mg via INTRAVENOUS
  Filled 2015-08-11: qty 2

## 2015-08-11 MED ORDER — HEPARIN SOD (PORK) LOCK FLUSH 100 UNIT/ML IV SOLN
500.0000 [IU] | Freq: Once | INTRAVENOUS | Status: AC | PRN
  Administered 2015-08-11: 500 [IU]
  Filled 2015-08-11: qty 5

## 2015-08-11 NOTE — Progress Notes (Signed)
[place] NOTE  Patient Care Team: [name], MD as PCP - General (Family Medicine)  CHIEF COMPLAINTS/PURPOSE OF CONSULTATION:   # OCT 2016- RUL POORLY DIFF CA [clinically non-small cell] ; STAGE IIB [T3-2 separate nodules in RUL; N0] [s/p CT guided bx]- s/p RT [finished mid jan 2017]; FEB 2017-CT scan- regression of RUL nodules; PDL-1 by IHC/keytruda- 11-20%.  # March 2017- Start carbo-taxol q 3 w x4  # OCT 2016- 2 sub cm nodules in Right LL- Feb CT 2017- STABLE  # OCT 2016-NON-invasive bladder urothelial cancer; high grade [Dr. [name]; s/p TURBT [Feb 2017- NEG]  # Pneumothorax [oct 2016 s/p CT Bx]   HISTORY OF PRESENTING ILLNESS:  Joseph Graham 75 y.o. male with a history of long-standing smoking/COPD-  Non-small cell/ poorly differentiated malignancy of the right upper lobe  T3 N0 status post radiation is here to proceed with adjuvant chemotherapy with carbo-taxol.  In the interim patient had a port placed. The patient denied having any unusual shortness of breath or cough or chest pain.  Patient's appetite is good. Denies any weight loss.   ROS: A complete 10 point review of system is done which is negative for mentioned above in history of present illness.  MEDICAL HISTORY:  Past Medical History  Diagnosis Date  . Colon polyps     next colonoscopy due around 2018  . HTN (hypertension)   . Alcohol abuse, in remission     remote  . Benign essential tremor     improved on metoprolol and gabapentin  . Dyslipidemia     mild off meds  . Chronic low back pain     MRI 2004, multilevel DDD  . BPH (benign prostatic hypertrophy) 12/30/2012  . Personal history of tobacco use, presenting hazards to health 02/18/2015    quit 06/2011, restarted 2014  . Multiple pulmonary nodules 02/2015  . COPD (chronic obstructive pulmonary disease) ([place])   . GERD (gastroesophageal reflux disease)   . Arthritis     BACK AND NECK  . Shortness of breath dyspnea     OCC WITH EXERTION  . Cardiac arrhythmia 03/2010    h/o a flutter and CM, s/p ablation, normal stress test  . Primary cancer of bladder ([place]) 2016    Budzyn  . Primary lung cancer ([place]) 2016  . Complication of anesthesia     DID NOT GET COMPLETELY NUMB WITH TONSILLECTOMY    SURGICAL HISTORY: Past Surgical History  Procedure Laterality Date  . Hemorroidectomy  1982  . A flutter ablation  03/2010  . Cataract extraction    . Carotid US  03/2010    WNL  . Colonoscopy  09/2011    polyps, rpt due 5 yrs, mild diverticulosis [name])  . Tonsillectomy  1964  . Electromagnetic navigation brochoscopy N/A 03/02/2015    Procedure: ELECTROMAGNETIC NAVIGATION BRONCHOSCOPY;  Surgeon: [name], MD;  Location: [facility];  Service: Cardiopulmonary;  Laterality: N/A;  . Transurethral resection of bladder tumor N/A 04/06/2015    Procedure: TRANSURETHRAL RESECTION OF BLADDER TUMOR (TURBT) right ureteral stent placement;  Surgeon: [name], MD;  Location: [facility];  Service: Urology;  Laterality: N/A;  . Cystoscopy w/ retrogrades Bilateral 04/06/2015    Procedure: CYSTOSCOPY WITH RETROGRADE PYELOGRAM;  Surgeon: [name], MD;  Location: [facility];  Service: Urology;  Laterality: Bilateral;  . Urinary stent removal  04/14/15  . Transurethral resection of bladder tumor N/A 07/13/2015    Procedure: TRANSURETHRAL RESECTION OF BLADDER  TUMOR (TURBT);  Surgeon: [name], MD;  Location: [facility];  Service: Urology;  Laterality: N/A;  . Portacath placement N/A 08/10/2015    Procedure: INSERTION PORT-A-CATH;  Surgeon: [name], MD;  Location: [facility];  Service: General;  Laterality: N/A;    SOCIAL HISTORY: Patient is retired from Press [employer]. He lives in [place]. Social History   Social History  . Marital Status: Married    Spouse Name: N/A  . Number of Children: N/A  . Years of Education: N/A   Occupational History  . Not on file.   Social History Main Topics  . Smoking  status: Former Smoker -- 1.50 packs/day for 61 years    Types: Cigarettes    Quit date: 03/08/2015  . Smokeless tobacco: Never Used     Comment: cut back 0.5 cigarettes--stopped 03/07/15; now now chantix  . Alcohol Use: No     Comment: has not drank in 37 years  . Drug Use: No  . Sexual Activity: Not on file   Other Topics Concern  . Not on file   Social History Narrative   Caffeine: 1 cup decaf/day   Lives with wife   Occupation: retired, worked Press photographer   Activity: gym 3x/wk, Immunologist, hobbies (paint, building things)   Diet: healthy, good water, fruits/vegetables daily, red meat 1x/wk    FAMILY HISTORY: Family History  Problem Relation Age of Onset  . Cervical cancer Mother 82  . Hypertension Father   . Stroke Brother 61  . Tremor Father     and several uncles/aunts (not parkinson's)  . Bladder Cancer Brother 73  . Prostate cancer Neg Hx   . Kidney cancer Neg Hx     ALLERGIES:  has No Known Allergies.  MEDICATIONS:  Current Outpatient Prescriptions  Medication Sig Dispense Refill  . acetaminophen (TYLENOL) 500 MG tablet Take 1,000 mg by mouth every 6 (six) hours as needed for moderate pain.    Marland Kitchen aspirin EC 81 MG tablet Take 81 mg by mouth daily.    . famotidine (PEPCID) 10 MG tablet Take 10 mg by mouth 2 (two) times daily.     . fluticasone furoate-vilanterol (BREO ELLIPTA) 100-25 MCG/INH AEPB Inhale 1 puff into the lungs daily. (Patient taking differently: Inhale 1 puff into the lungs every morning. ) 60 each 5  . gabapentin (NEURONTIN) 100 MG capsule Take 100 mg by mouth 2 (two) times daily. AND PRN FOR ANY ADDITIONAL TREMORS    . lisinopril (PRINIVIL,ZESTRIL) 10 MG tablet Take 1 tablet (10 mg total) by mouth daily. (Patient taking differently: Take 10 mg by mouth every evening. ) 90 tablet 3  . metoprolol tartrate (LOPRESSOR) 25 MG tablet Take 0.5 tablets (12.5 mg total) by mouth 2 (two) times daily. 90 tablet 3  . naproxen sodium (ALEVE) 220 MG tablet Take 220 mg by  mouth daily. Reported on 08/10/2015    . umeclidinium bromide (INCRUSE ELLIPTA) 62.5 MCG/INH AEPB Inhale 1 puff into the lungs daily. (Patient taking differently: Inhale 1 puff into the lungs every morning. ) 30 each 5  . albuterol (PROVENTIL HFA;VENTOLIN HFA) 108 (90 Base) MCG/ACT inhaler Inhale 2 puffs into the lungs every 6 (six) hours as needed for wheezing or shortness of breath. 1 Inhaler 2   No current facility-administered medications for this visit.  Marland Kitchen  PHYSICAL EXAMINATION: ECOG PERFORMANCE STATUS: 0 - Asymptomatic  Filed Vitals:   08/11/15 0937 08/11/15 0943  BP: 147/70   Pulse: 79   Temp: 100 F (37.8  C) 98.7 F (37.1 C)  Resp: 18    Filed Weights   08/11/15 0937  Weight: 217 lb 9.5 oz (98.7 kg)    GENERAL:alert, no distress and comfortable. He is accompanied by his wife.  SKIN: skin color, texture, turgor are normal, no rashes or significant lesions EYES: normal, conjunctiva are pink and non-injected, sclera clear OROPHARYNX:no exudate, no erythema and lips, buccal mucosa, and tongue normal  NECK: supple, thyroid normal size, non-tender, without nodularity LYMPH:  no palpable lymphadenopathy in the cervical, axillary or inguinal LUNGS: clear to auscultation and percussion with normal breathing effort HEART: regular rate & rhythm and no murmurs and no lower extremity edema ABDOMEN:abdomen soft, non-tender and normal bowel sounds Musculoskeletal:no cyanosis of digits and no clubbing  PSYCH: alert & oriented x 3 with fluent speech NEURO: no focal motor/sensory deficits  LABORATORY DATA:  I have reviewed the data as listed Lab Results  Component Value Date   WBC 8.4 08/11/2015   HGB 12.5* 08/11/2015   HCT 36.4* 08/11/2015   MCV 93.0 08/11/2015   PLT 229 08/11/2015    Recent Labs  03/22/15 0504 06/30/15 1006 07/18/15 1007 08/11/15 0914  NA 137 139 135 133*  K 4.5 3.9 4.0 3.9  CL 101 102 101 100*  CO2 '28 30 30 28  ' GLUCOSE 106* 120* 165* 161*  BUN 33*  25* 27* 26*  CREATININE 0.93 0.86 0.92 0.91  CALCIUM 9.0 9.0 8.9 8.4*  GFRNONAA >60 >60 >60 >60  GFRAA >60 >60 >60 >60  PROT 7.5  --  7.8 7.7  ALBUMIN 4.2  --  4.1 3.7  AST 29  --  25 22  ALT 29  --  28 26  ALKPHOS 80  --  99 95  BILITOT 0.6  --  0.4 0.4    RADIOGRAPHIC STUDIES:  ASSESSMENT & PLAN:   # Right UL POORLY DIFF CA- T3 N0  Currently status post radiation [finished feb 2017];  PR  On CT scan.  Proceed with adjuvant chemotherapy carbotaxol 4.  #  I again reviewed the potential side effects of chemotherapy in detail.  Recommend onpro.   # Subcentimeter right lower lobe lung nodules- question etiology/too small for the threshold of PET scan or biopsy. STABLE on follow up CT scan.   # Noninvasive high-grade bladder cancer-  Most recent cystoscopy negative.  Recommending holding BCG for now.; spoke to Dr.Budzyn; Urology.   #  Patient will follow-up with me  In 3 weeks/ CBC CMP chemotherapy;  In 10 days with CBC BMP.   # 25 minutes face-to-face with the patient discussing the above plan of care; more than 50% of spent on prognosis/ natural history; counseling and coordination.    Cammie Sickle, MD 08/11/2015 9:44 AM

## 2015-08-11 NOTE — Progress Notes (Signed)
Ready for chemo-checked temp typanic 100.0 and oral 98.5.  He is going for lung study with dr Jenell Milliner in 2 weeks and wants to know if the chemo will make any side effects that will make a difference in lung study.  Wants to know how much down time he will have with chemo side effects.  First chemo today for [place] and taxol. Port site sutures in place and area is dry and clean.  No redness.

## 2015-08-12 ENCOUNTER — Other Ambulatory Visit: Payer: Self-pay | Admitting: *Deleted

## 2015-08-12 DIAGNOSIS — Z95828 Presence of other vascular implants and grafts: Secondary | ICD-10-CM

## 2015-08-12 MED ORDER — LIDOCAINE-PRILOCAINE 2.5-2.5 % EX CREA
1.0000 | TOPICAL_CREAM | CUTANEOUS | Status: DC | PRN
Start: 2015-08-12 — End: 2016-04-02

## 2015-08-12 NOTE — Progress Notes (Signed)
emla cream rx sent to patient's pharmacy.

## 2015-08-15 ENCOUNTER — Telehealth: Payer: Self-pay | Admitting: *Deleted

## 2015-08-15 ENCOUNTER — Telehealth: Payer: Self-pay | Admitting: Internal Medicine

## 2015-08-15 DIAGNOSIS — J449 Chronic obstructive pulmonary disease, unspecified: Secondary | ICD-10-CM

## 2015-08-15 NOTE — Telephone Encounter (Signed)
Pt informed of the need for 2L O2 At night per ONO. Order placed.

## 2015-08-15 NOTE — Telephone Encounter (Signed)
May take tylenol, can take IBU or Aleve q 8 h times 2 days only. Pt advised of this and said tylenol does not work so he will try IBU

## 2015-08-15 NOTE — Telephone Encounter (Signed)
Will forward to [place] as an [place]

## 2015-08-15 NOTE — Telephone Encounter (Signed)
Pt wanted Dr. Mortimer Fries to know he is cancelling his appts due to taking chemo. States he is not sure when he will be able to reschedule, but will do so when his chemo is done

## 2015-08-15 NOTE — Telephone Encounter (Signed)
Had chemo last week He did get On Pro but no one instructed him to take any claritin adn he states that he has had aching all over even his teeth hurt yesterday. It is a little better today, but still has significant pain. Asking what he can take?

## 2015-08-16 DIAGNOSIS — J449 Chronic obstructive pulmonary disease, unspecified: Secondary | ICD-10-CM | POA: Diagnosis not present

## 2015-08-18 ENCOUNTER — Inpatient Hospital Stay (HOSPITAL_BASED_OUTPATIENT_CLINIC_OR_DEPARTMENT_OTHER): Payer: Medicare Other | Admitting: Cardiothoracic Surgery

## 2015-08-18 VITALS — BP 121/71 | HR 80 | Temp 97.8°F | Ht 69.0 in | Wt 210.3 lb

## 2015-08-18 DIAGNOSIS — C3490 Malignant neoplasm of unspecified part of unspecified bronchus or lung: Secondary | ICD-10-CM

## 2015-08-18 NOTE — Progress Notes (Signed)
Sutures removed over right chest port and right neck.  Incision approximated,  Covered with allevyn dressing

## 2015-08-22 ENCOUNTER — Ambulatory Visit
Admission: RE | Admit: 2015-08-22 | Discharge: 2015-08-22 | Disposition: A | Discharge status: Home / Self Care | Payer: Medicare Other | Source: Ambulatory Visit | Attending: Urology | Admitting: Urology

## 2015-08-22 DIAGNOSIS — C674 Malignant neoplasm of posterior wall of bladder: Secondary | ICD-10-CM

## 2015-08-22 DIAGNOSIS — Z8551 Personal history of malignant neoplasm of bladder: Secondary | ICD-10-CM | POA: Diagnosis not present

## 2015-08-25 ENCOUNTER — Inpatient Hospital Stay: Payer: Medicare Other

## 2015-08-25 ENCOUNTER — Ambulatory Visit (INDEPENDENT_AMBULATORY_CARE_PROVIDER_SITE_OTHER): Payer: Medicare Other | Admitting: Urology

## 2015-08-25 VITALS — BP 136/67 | HR 81 | Ht 69.0 in | Wt 211.0 lb

## 2015-08-25 DIAGNOSIS — Z808 Family history of malignant neoplasm of other organs or systems: Secondary | ICD-10-CM | POA: Diagnosis not present

## 2015-08-25 DIAGNOSIS — C672 Malignant neoplasm of lateral wall of bladder: Secondary | ICD-10-CM | POA: Diagnosis not present

## 2015-08-25 DIAGNOSIS — M2550 Pain in unspecified joint: Secondary | ICD-10-CM | POA: Diagnosis not present

## 2015-08-25 DIAGNOSIS — Z8551 Personal history of malignant neoplasm of bladder: Secondary | ICD-10-CM | POA: Diagnosis not present

## 2015-08-25 DIAGNOSIS — G250 Essential tremor: Secondary | ICD-10-CM | POA: Diagnosis not present

## 2015-08-25 DIAGNOSIS — Z87891 Personal history of nicotine dependence: Secondary | ICD-10-CM | POA: Diagnosis not present

## 2015-08-25 DIAGNOSIS — M545 Low back pain: Secondary | ICD-10-CM | POA: Diagnosis not present

## 2015-08-25 DIAGNOSIS — Z7982 Long term (current) use of aspirin: Secondary | ICD-10-CM | POA: Diagnosis not present

## 2015-08-25 DIAGNOSIS — Z79899 Other long term (current) drug therapy: Secondary | ICD-10-CM | POA: Diagnosis not present

## 2015-08-25 DIAGNOSIS — Z5111 Encounter for antineoplastic chemotherapy: Secondary | ICD-10-CM | POA: Diagnosis not present

## 2015-08-25 DIAGNOSIS — C3411 Malignant neoplasm of upper lobe, right bronchus or lung: Secondary | ICD-10-CM | POA: Diagnosis not present

## 2015-08-25 DIAGNOSIS — G8929 Other chronic pain: Secondary | ICD-10-CM | POA: Diagnosis not present

## 2015-08-25 DIAGNOSIS — E785 Hyperlipidemia, unspecified: Secondary | ICD-10-CM | POA: Diagnosis not present

## 2015-08-25 DIAGNOSIS — K219 Gastro-esophageal reflux disease without esophagitis: Secondary | ICD-10-CM | POA: Diagnosis not present

## 2015-08-25 DIAGNOSIS — I10 Essential (primary) hypertension: Secondary | ICD-10-CM | POA: Diagnosis not present

## 2015-08-25 DIAGNOSIS — J449 Chronic obstructive pulmonary disease, unspecified: Secondary | ICD-10-CM | POA: Diagnosis not present

## 2015-08-25 DIAGNOSIS — I499 Cardiac arrhythmia, unspecified: Secondary | ICD-10-CM | POA: Diagnosis not present

## 2015-08-25 DIAGNOSIS — M1990 Unspecified osteoarthritis, unspecified site: Secondary | ICD-10-CM | POA: Diagnosis not present

## 2015-08-25 DIAGNOSIS — N400 Enlarged prostate without lower urinary tract symptoms: Secondary | ICD-10-CM | POA: Diagnosis not present

## 2015-08-25 DIAGNOSIS — C3491 Malignant neoplasm of unspecified part of right bronchus or lung: Secondary | ICD-10-CM

## 2015-08-25 DIAGNOSIS — R0602 Shortness of breath: Secondary | ICD-10-CM | POA: Diagnosis not present

## 2015-08-25 DIAGNOSIS — Z8052 Family history of malignant neoplasm of bladder: Secondary | ICD-10-CM | POA: Diagnosis not present

## 2015-08-25 DIAGNOSIS — R1013 Epigastric pain: Secondary | ICD-10-CM | POA: Diagnosis not present

## 2015-08-25 DIAGNOSIS — Z86010 Personal history of colonic polyps: Secondary | ICD-10-CM | POA: Diagnosis not present

## 2015-08-25 DIAGNOSIS — R918 Other nonspecific abnormal finding of lung field: Secondary | ICD-10-CM | POA: Diagnosis not present

## 2015-08-25 LAB — CBC WITH DIFFERENTIAL/PLATELET
Basophils Absolute: 0 10*3/uL (ref 0–0.1)
Basophils Relative: 1 %
EOS PCT: 4 %
Eosinophils Absolute: 0.2 10*3/uL (ref 0–0.7)
HCT: 39.5 % — ABNORMAL LOW (ref 40.0–52.0)
Hemoglobin: 13.5 g/dL (ref 13.0–18.0)
LYMPHS ABS: 1.2 10*3/uL (ref 1.0–3.6)
LYMPHS PCT: 29 %
MCH: 31.8 pg (ref 26.0–34.0)
MCHC: 34 g/dL (ref 32.0–36.0)
MCV: 93.3 fL (ref 80.0–100.0)
MONO ABS: 0.5 10*3/uL (ref 0.2–1.0)
Monocytes Relative: 13 %
Neutro Abs: 2.3 10*3/uL (ref 1.4–6.5)
Neutrophils Relative %: 53 %
PLATELETS: 208 10*3/uL (ref 150–440)
RBC: 4.24 MIL/uL — ABNORMAL LOW (ref 4.40–5.90)
RDW: 13.8 % (ref 11.5–14.5)
WBC: 4.2 10*3/uL (ref 3.8–10.6)

## 2015-08-25 LAB — BASIC METABOLIC PANEL
Anion gap: 7 (ref 5–15)
BUN: 30 mg/dL — AB (ref 6–20)
CHLORIDE: 101 mmol/L (ref 101–111)
CO2: 28 mmol/L (ref 22–32)
CREATININE: 0.98 mg/dL (ref 0.61–1.24)
Calcium: 8.8 mg/dL — ABNORMAL LOW (ref 8.9–10.3)
GFR calc Af Amer: >60 mL/min (ref >60)
GLUCOSE: 136 mg/dL — AB (ref 65–99)
POTASSIUM: 4.2 mmol/L (ref 3.5–5.1)
Sodium: 136 mmol/L (ref 135–145)

## 2015-08-25 NOTE — Progress Notes (Signed)
08/25/2015 11:14 AM   Joseph Graham 04-Aug-1940 [id number]  Referring provider: [name], MD [address] [place], [place] [address]  Chief Complaint  Patient presents with  . Results    79monthwith u/s results    HPI: The patient is a 75year old gentleman who has both lung and bladder lesions. He had a 2-3 cm exophytic lesion in the midline posterior bladder wall superior to the trigone during office cystoscopy at his last visit. At that time, there was more concern about his lung lesion, so we planned to have his lung lesion addressed first with the TURBT to follow lung surgery . He had a lung biopsy which showed poorly differentiated carcinoma of unclear origin. There is concern it may be metastasis from the bladder, so he was sent back now for resection of his bladder tumor as well as to get a tissue specimen to determine the next step in his treatment. He has since undergone TURBT. His pathology shows noninvasive high-grade urothelial carcinoma. Is unclear TA or T1 disease based on the pathology report. There was no muscle in the specimen. It is a different carcinoma morphologically from his lung cancer. It is not clear at this time whether here not receiving chemotherapy as part of his undifferentiated carcinoma of the lung treatment.  Interval History:  The patient has done well since being seen in our clinic. He is undergoing radiation currently for his lung cancer. Is scheduled to be finished mid January 2017. He is not scheduled to receive chemotherapy at this time. He does note occasional spraying of his stream from his meatal stenosis. He is not using his meatal dilator. He does not desire any further dilation this time due to pain.   February 2017 interval history: The patient underwent repeat TURBT which was negative. Since official diagnosis high grade T1 TCC with no muscle involvement. He is scheduled to go under carboxotaxol chemotherapy for his lung  lesion starting next week. He is otherwise doing well. He is voiding with a good stream.  March 2017 interval history: Returns today to go over renal ultrasound as he had a resection ears right ureter orifice. His renal ultrasound is normal with no sign of iatrogenic hydronephrosis. He is currently undergoing chemotherapy. He is scheduled to complete it in approximately 6 weeks.   PMH: Past Medical History  Diagnosis Date  . Colon polyps     next colonoscopy due around 2018  . HTN (hypertension)   . Alcohol abuse, in remission     remote  . Benign essential tremor     improved on metoprolol and gabapentin  . Dyslipidemia     mild off meds  . Chronic low back pain     MRI 2004, multilevel DDD  . BPH (benign prostatic hypertrophy) 12/30/2012  . Personal history of tobacco use, presenting hazards to health 02/18/2015    quit 06/2011, restarted 2014  . Multiple pulmonary nodules 02/2015  . COPD (chronic obstructive pulmonary disease) ([facility]   . GERD (gastroesophageal reflux disease)   . Arthritis     BACK AND NECK  . Shortness of breath dyspnea     OCC WITH EXERTION  . Cardiac arrhythmia 03/2010    h/o a flutter and CM, s/p ablation, normal stress test  . Primary cancer of bladder ([facility] 2016    [place]  . Primary lung cancer ([facility] 2016  . Complication of anesthesia     DID NOT GET COMPLETELY NUMB WITH TONSILLECTOMY  Surgical History: Past Surgical History  Procedure Laterality Date  . Hemorroidectomy  1982  . A flutter ablation  03/2010  . Cataract extraction    . Carotid US  03/2010    WNL  . Colonoscopy  09/2011    polyps, rpt due 5 yrs, mild diverticulosis [name])  . Tonsillectomy  1964  . Electromagnetic navigation brochoscopy N/A 03/02/2015    Procedure: ELECTROMAGNETIC NAVIGATION BRONCHOSCOPY;  Surgeon: [name], MD;  Location: [facility];  Service: Cardiopulmonary;  Laterality: N/A;  . Transurethral resection of bladder tumor N/A 04/06/2015    Procedure:  TRANSURETHRAL RESECTION OF BLADDER TUMOR (TURBT) right ureteral stent placement;  Surgeon: [name], MD;  Location: [facility];  Service: Urology;  Laterality: N/A;  . Cystoscopy w/ retrogrades Bilateral 04/06/2015    Procedure: CYSTOSCOPY WITH RETROGRADE PYELOGRAM;  Surgeon: [name], MD;  Location: [facility];  Service: Urology;  Laterality: Bilateral;  . Urinary stent removal  04/14/15  . Transurethral resection of bladder tumor N/A 07/13/2015    Procedure: TRANSURETHRAL RESECTION OF BLADDER TUMOR (TURBT);  Surgeon: [name], MD;  Location: [facility];  Service: Urology;  Laterality: N/A;  . Portacath placement N/A 08/10/2015    Procedure: INSERTION PORT-A-CATH;  Surgeon: [name], MD;  Location: [facility];  Service: General;  Laterality: N/A;    Home Medications:    Medication List       This list is accurate as of: 08/25/15 11:14 AM.  Always use your most recent med list.               acetaminophen 500 MG tablet  Commonly known as:  TYLENOL  Take 1,000 mg by mouth every 6 (six) hours as needed for moderate pain.     albuterol 108 (90 Base) MCG/ACT inhaler  Commonly known as:  PROVENTIL HFA;VENTOLIN HFA  Inhale 2 puffs into the lungs every 6 (six) hours as needed for wheezing or shortness of breath.     ALEVE 220 MG tablet  Generic drug:  naproxen sodium  Take 220 mg by mouth daily. Reported on 08/10/2015     famotidine 10 MG tablet  Commonly known as:  PEPCID  Take 10 mg by mouth 2 (two) times daily.     fluticasone furoate-vilanterol 100-25 MCG/INH Aepb  Commonly known as:  BREO ELLIPTA  Inhale 1 puff into the lungs daily.     gabapentin 100 MG capsule  Commonly known as:  NEURONTIN  Take 100 mg by mouth 2 (two) times daily. AND PRN FOR ANY ADDITIONAL TREMORS     lidocaine-prilocaine cream  Commonly known as:  EMLA  Apply 1 application topically as needed. To port a cath site     lisinopril 10 MG tablet  Commonly known as:  PRINIVIL,ZESTRIL    Take 1 tablet (10 mg total) by mouth daily.     metoprolol tartrate 25 MG tablet  Commonly known as:  LOPRESSOR  Take 0.5 tablets (12.5 mg total) by mouth 2 (two) times daily.     ondansetron 8 MG tablet  Commonly known as:  ZOFRAN  Take 1 tablet (8 mg total) by mouth every 8 (eight) hours as needed for nausea or vomiting (start 3 days; after chemo).     prochlorperazine 10 MG tablet  Commonly known as:  COMPAZINE  Take 1 tablet (10 mg total) by mouth every 6 (six) hours as needed for nausea or vomiting.     umeclidinium bromide 62.5 MCG/INH Aepb  Commonly known as:  INCRUSE ELLIPTA  Inhale 1 puff into the lungs daily.        Allergies: No Known Allergies  Family History: Family History  Problem Relation Age of Onset  . Cervical cancer Mother 48  . Hypertension Father   . Stroke Brother 47  . Tremor Father     and several uncles/aunts (not parkinson's)  . Bladder Cancer Brother 87  . Prostate cancer Neg Hx   . Kidney cancer Neg Hx     Social History:  reports that he quit smoking about 5 months ago. His smoking use included Cigarettes. He has a 91.5 pack-year smoking history. He has never used smokeless tobacco. He reports that he does not drink alcohol or use illicit drugs.  ROS: UROLOGY Frequent Urination?: No Hard to postpone urination?: No Burning/pain with urination?: No Get up at night to urinate?: No Leakage of urine?: No Urine stream starts and stops?: No Trouble starting stream?: No Do you have to strain to urinate?: No Blood in urine?: No Urinary tract infection?: No Sexually transmitted disease?: No Injury to kidneys or bladder?: No Painful intercourse?: No Weak stream?: No Erection problems?: No Penile pain?: No  Gastrointestinal Nausea?: No Vomiting?: No Indigestion/heartburn?: No Diarrhea?: Yes Constipation?: Yes  Constitutional Fever: No Night sweats?: No Weight loss?: Yes Fatigue?: No  Skin Skin rash/lesions?: No Itching?:  No  Eyes Blurred vision?: No Double vision?: No  Ears/Nose/Throat Sore throat?: No Sinus problems?: No  Hematologic/Lymphatic Swollen glands?: No Easy bruising?: No  Cardiovascular Leg swelling?: No Chest pain?: No  Respiratory Cough?: Yes Shortness of breath?: No  Endocrine Excessive thirst?: No  Musculoskeletal Back pain?: No Joint pain?: No  Neurological Headaches?: Yes Dizziness?: Yes  Psychologic Depression?: No Anxiety?: No  Physical Exam: BP 136/67 mmHg  Pulse 81  Ht '5\' 9"'$  (1.753 m)  Wt 211 lb (95.709 kg)  BMI 31.15 kg/m2  Constitutional:  Alert and oriented, No acute distress. HEENT: [place] AT, moist mucus membranes.  Trachea midline, no masses. Cardiovascular: No clubbing, cyanosis, or edema. Respiratory: Normal respiratory effort, no increased work of breathing. GI: Abdomen is soft, nontender, nondistended, no abdominal masses GU: No CVA tenderness.  Skin: No rashes, bruises or suspicious lesions. Lymph: No cervical or inguinal adenopathy. Neurologic: Grossly intact, no focal deficits, moving all 4 extremities. Psychiatric: Normal mood and affect.  Laboratory Data: Lab Results  Component Value Date   WBC 4.2 08/25/2015   HGB 13.5 08/25/2015   HCT 39.5* 08/25/2015   MCV 93.3 08/25/2015   PLT 208 08/25/2015    Lab Results  Component Value Date   CREATININE 0.98 08/25/2015    Lab Results  Component Value Date   PSA 0.60 01/24/2015   PSA 0.68 12/31/2013   PSA 0.67 12/30/2012    No results found for: TESTOSTERONE  Lab Results  Component Value Date   HGBA1C 6.3 01/24/2015    Urinalysis    Component Value Date/Time   APPEARANCEUR Cloudy* 04/14/2015 0822   GLUCOSEU Negative 04/14/2015 0822   BILIRUBINUR Negative 04/14/2015 0822   PROTEINUR 2+* 04/14/2015 0822   NITRITE Negative 04/14/2015 0822   LEUKOCYTESUR 1+* 04/14/2015 0822    Pertinent Imaging: Study Result     CLINICAL DATA: History of bladder neoplasm with removal  3 months ago  EXAM: RENAL / URINARY TRACT ULTRASOUND COMPLETE  COMPARISON: 04/08/2015  FINDINGS: Right Kidney:  Length: 10.9 cm. Echogenicity within normal limits. No mass or hydronephrosis visualized.  Left Kidney:  Length: 12.2 cm. Echogenicity within normal limits. No  mass or hydronephrosis visualized.  Bladder:  Appears normal for degree of bladder distention. Ureteral jet is only noted unilaterally and appears to come from the right ureter although no obstructive changes are seen.  IMPRESSION: No acute abnormality noted.     Assessment & Plan:    1. High-grade T1 TCC of the bladder -The patient is due for surveillance cystoscopy in 2 months, however we will need to wait 3 months to undergo this procedure so he can finish his chemotherapy first and have a chance for his immune system to recover.  -He is unable to undergo intravesical therapy at this time as he is undergoing chemotherapy for his lung cancer. He will follow-up after his chemotherapy to discuss intravesical therapy and surveillance cystoscopy in 3 months.   Return in about 3 months (around 11/25/2015) for cystoscopy.  [name], MD  [facility] [address], [place], [place] [address] [phone]

## 2015-08-26 ENCOUNTER — Ambulatory Visit: Payer: Medicare Other

## 2015-08-29 ENCOUNTER — Ambulatory Visit: Payer: Medicare Other | Admitting: Internal Medicine

## 2015-09-01 ENCOUNTER — Inpatient Hospital Stay: Payer: Medicare Other

## 2015-09-01 ENCOUNTER — Inpatient Hospital Stay (HOSPITAL_BASED_OUTPATIENT_CLINIC_OR_DEPARTMENT_OTHER): Payer: Medicare Other | Admitting: Internal Medicine

## 2015-09-01 ENCOUNTER — Encounter: Payer: Self-pay | Admitting: Internal Medicine

## 2015-09-01 VITALS — BP 133/68 | HR 71 | Temp 97.0°F | Resp 18 | Ht 69.0 in | Wt 212.5 lb

## 2015-09-01 VITALS — BP 119/69 | HR 74 | Resp 18

## 2015-09-01 DIAGNOSIS — Z7982 Long term (current) use of aspirin: Secondary | ICD-10-CM

## 2015-09-01 DIAGNOSIS — I10 Essential (primary) hypertension: Secondary | ICD-10-CM

## 2015-09-01 DIAGNOSIS — C3490 Malignant neoplasm of unspecified part of unspecified bronchus or lung: Secondary | ICD-10-CM

## 2015-09-01 DIAGNOSIS — C3491 Malignant neoplasm of unspecified part of right bronchus or lung: Secondary | ICD-10-CM

## 2015-09-01 DIAGNOSIS — Z808 Family history of malignant neoplasm of other organs or systems: Secondary | ICD-10-CM | POA: Diagnosis not present

## 2015-09-01 DIAGNOSIS — G8929 Other chronic pain: Secondary | ICD-10-CM

## 2015-09-01 DIAGNOSIS — M1990 Unspecified osteoarthritis, unspecified site: Secondary | ICD-10-CM

## 2015-09-01 DIAGNOSIS — M545 Low back pain: Secondary | ICD-10-CM | POA: Diagnosis not present

## 2015-09-01 DIAGNOSIS — G250 Essential tremor: Secondary | ICD-10-CM

## 2015-09-01 DIAGNOSIS — R918 Other nonspecific abnormal finding of lung field: Secondary | ICD-10-CM

## 2015-09-01 DIAGNOSIS — M2550 Pain in unspecified joint: Secondary | ICD-10-CM

## 2015-09-01 DIAGNOSIS — Z8052 Family history of malignant neoplasm of bladder: Secondary | ICD-10-CM

## 2015-09-01 DIAGNOSIS — C3411 Malignant neoplasm of upper lobe, right bronchus or lung: Secondary | ICD-10-CM | POA: Diagnosis not present

## 2015-09-01 DIAGNOSIS — J449 Chronic obstructive pulmonary disease, unspecified: Secondary | ICD-10-CM | POA: Diagnosis not present

## 2015-09-01 DIAGNOSIS — R1013 Epigastric pain: Secondary | ICD-10-CM

## 2015-09-01 DIAGNOSIS — K219 Gastro-esophageal reflux disease without esophagitis: Secondary | ICD-10-CM | POA: Diagnosis not present

## 2015-09-01 DIAGNOSIS — Z5111 Encounter for antineoplastic chemotherapy: Secondary | ICD-10-CM | POA: Diagnosis not present

## 2015-09-01 DIAGNOSIS — Z8551 Personal history of malignant neoplasm of bladder: Secondary | ICD-10-CM | POA: Diagnosis not present

## 2015-09-01 DIAGNOSIS — N400 Enlarged prostate without lower urinary tract symptoms: Secondary | ICD-10-CM

## 2015-09-01 DIAGNOSIS — Z87891 Personal history of nicotine dependence: Secondary | ICD-10-CM

## 2015-09-01 DIAGNOSIS — R0602 Shortness of breath: Secondary | ICD-10-CM

## 2015-09-01 DIAGNOSIS — Z86010 Personal history of colonic polyps: Secondary | ICD-10-CM | POA: Diagnosis not present

## 2015-09-01 DIAGNOSIS — E785 Hyperlipidemia, unspecified: Secondary | ICD-10-CM

## 2015-09-01 DIAGNOSIS — I499 Cardiac arrhythmia, unspecified: Secondary | ICD-10-CM

## 2015-09-01 DIAGNOSIS — Z79899 Other long term (current) drug therapy: Secondary | ICD-10-CM

## 2015-09-01 LAB — CBC WITH DIFFERENTIAL/PLATELET
BASOS ABS: 0.1 10*3/uL (ref 0–0.1)
BASOS PCT: 1 %
EOS ABS: 0.1 10*3/uL (ref 0–0.7)
EOS PCT: 1 %
HCT: 36.4 % — ABNORMAL LOW (ref 40.0–52.0)
Hemoglobin: 12.7 g/dL — ABNORMAL LOW (ref 13.0–18.0)
LYMPHS ABS: 1.2 10*3/uL (ref 1.0–3.6)
Lymphocytes Relative: 17 %
MCH: 32.3 pg (ref 26.0–34.0)
MCHC: 34.8 g/dL (ref 32.0–36.0)
MCV: 92.8 fL (ref 80.0–100.0)
Monocytes Absolute: 0.6 10*3/uL (ref 0.2–1.0)
Monocytes Relative: 9 %
Neutro Abs: 5.1 10*3/uL (ref 1.4–6.5)
Neutrophils Relative %: 72 %
PLATELETS: 225 10*3/uL (ref 150–440)
RBC: 3.92 MIL/uL — AB (ref 4.40–5.90)
RDW: 14.1 % (ref 11.5–14.5)
WBC: 7.1 10*3/uL (ref 3.8–10.6)

## 2015-09-01 LAB — COMPREHENSIVE METABOLIC PANEL
ALBUMIN: 3.8 g/dL (ref 3.5–5.0)
ALT: 29 U/L (ref 17–63)
ANION GAP: 7 (ref 5–15)
AST: 25 U/L (ref 15–41)
Alkaline Phosphatase: 96 U/L (ref 38–126)
BUN: 29 mg/dL — ABNORMAL HIGH (ref 6–20)
CHLORIDE: 103 mmol/L (ref 101–111)
CO2: 28 mmol/L (ref 22–32)
CREATININE: 0.87 mg/dL (ref 0.61–1.24)
Calcium: 8.7 mg/dL — ABNORMAL LOW (ref 8.9–10.3)
GFR calc non Af Amer: >60 mL/min (ref >60)
GLUCOSE: 148 mg/dL — AB (ref 65–99)
Potassium: 4.3 mmol/L (ref 3.5–5.1)
SODIUM: 138 mmol/L (ref 135–145)
Total Bilirubin: 0.5 mg/dL (ref 0.3–1.2)
Total Protein: 7.5 g/dL (ref 6.5–8.1)

## 2015-09-01 MED ORDER — HEPARIN SOD (PORK) LOCK FLUSH 100 UNIT/ML IV SOLN: 500.0000 [IU] | Freq: Once | INTRAVENOUS | Status: DC | PRN

## 2015-09-01 MED ORDER — DEXAMETHASONE SODIUM PHOSPHATE 100 MG/10ML IJ SOLN
20.0000 mg | Freq: Once | INTRAMUSCULAR | Status: AC
  Administered 2015-09-01: 20 mg via INTRAVENOUS
  Filled 2015-09-01: qty 2

## 2015-09-01 MED ORDER — PACLITAXEL CHEMO INJECTION 300 MG/50ML
200.0000 mg/m2 | Freq: Once | INTRAVENOUS | Status: DC
  Filled 2015-09-01: qty 73

## 2015-09-01 MED ORDER — FAMOTIDINE IN NACL 20-0.9 MG/50ML-% IV SOLN
20.0000 mg | Freq: Once | INTRAVENOUS | Status: AC
  Administered 2015-09-01: 20 mg via INTRAVENOUS
  Filled 2015-09-01: qty 50

## 2015-09-01 MED ORDER — SODIUM CHLORIDE 0.9 % IV SOLN
683.4000 mg | Freq: Once | INTRAVENOUS | Status: AC
  Administered 2015-09-01: 680 mg via INTRAVENOUS
  Filled 2015-09-01: qty 68

## 2015-09-01 MED ORDER — SODIUM CHLORIDE 0.9% FLUSH
10.0000 mL | Freq: Once | INTRAVENOUS | Status: AC
  Administered 2015-09-01: 10 mL via INTRAVENOUS
  Filled 2015-09-01: qty 10

## 2015-09-01 MED ORDER — SODIUM CHLORIDE 0.9 % IV SOLN
200.0000 mg/m2 | Freq: Once | INTRAVENOUS | Status: AC
  Administered 2015-09-01: 438 mg via INTRAVENOUS
  Filled 2015-09-01: qty 73

## 2015-09-01 MED ORDER — PEGFILGRASTIM 6 MG/0.6ML ~~LOC~~ PSKT
6.0000 mg | PREFILLED_SYRINGE | Freq: Once | SUBCUTANEOUS | Status: AC
  Administered 2015-09-01: 6 mg via SUBCUTANEOUS
  Filled 2015-09-01: qty 0.6

## 2015-09-01 MED ORDER — HEPARIN SOD (PORK) LOCK FLUSH 100 UNIT/ML IV SOLN
500.0000 [IU] | Freq: Once | INTRAVENOUS | Status: AC
Start: 2015-09-01 — End: 2015-09-01
  Administered 2015-09-01: 500 [IU] via INTRAVENOUS
  Filled 2015-09-01: qty 5

## 2015-09-01 MED ORDER — SODIUM CHLORIDE 0.9 % IV SOLN
Freq: Once | INTRAVENOUS | Status: AC
  Administered 2015-09-01: 11:00:00 via INTRAVENOUS
  Filled 2015-09-01: qty 1000

## 2015-09-01 MED ORDER — DIPHENHYDRAMINE HCL 50 MG/ML IJ SOLN
50.0000 mg | Freq: Once | INTRAMUSCULAR | Status: AC
  Administered 2015-09-01: 50 mg via INTRAVENOUS
  Filled 2015-09-01: qty 1

## 2015-09-01 MED ORDER — PALONOSETRON HCL INJECTION 0.25 MG/5ML
0.2500 mg | Freq: Once | INTRAVENOUS | Status: AC
  Administered 2015-09-01: 0.25 mg via INTRAVENOUS
  Filled 2015-09-01: qty 5

## 2015-09-01 MED ORDER — HYDROCODONE-ACETAMINOPHEN 5-325 MG PO TABS: 1.0000 | ORAL_TABLET | Freq: Three times a day (TID) | ORAL | Status: DC | PRN

## 2015-09-01 NOTE — Progress Notes (Signed)
[place] NOTE  Patient Care Team: [name], MD as PCP - General (Family Medicine)  CHIEF COMPLAINTS/PURPOSE OF CONSULTATION:   # OCT 2016- RUL POORLY DIFF CA [clinically non-small cell] ; STAGE IIB [T3-2 separate nodules in RUL; N0] [s/p CT guided bx]- s/p RT [finished mid jan 2017]; FEB 2017-CT scan- regression of RUL nodules; PDL-1 by IHC/keytruda- 11-20%.  # March 2017- Start carbo-taxol q 3 w x4  # OCT 2016- 2 sub cm nodules in Right LL- Feb CT 2017- STABLE  # OCT 2016-NON-invasive bladder urothelial cancer; high grade [Dr. [name]; s/p TURBT [Feb 2017- NEG]  # Pneumothorax [oct 2016 s/p CT Bx]   HISTORY OF PRESENTING ILLNESS:  Joseph Graham 75 y.o. male with a history of long-standing smoking/COPD-  Non-small cell/ poorly differentiated malignancy of the right upper lobe  T3 N0 status post radiation is here to proceed with adjuvant chemotherapy with carbo-taxol; is currently status post cycle #1 of carbotaxol.  Patient complains of significant body aches mother legs or joint pains day or so after the chemotherapy. It resolved after 3-4 days. Otherwise no nausea no vomiting.  The patient denied having any unusual shortness of breath or cough or chest pain.  Patient's appetite is good. Denies any weight loss. Mild constipation no diarrhea.  Patient has mild abdominal discomfort  dyspepsia.  ROS: A complete 10 point review of system is done which is negative for mentioned above in history of present illness.  MEDICAL HISTORY:  Past Medical History  Diagnosis Date  . Colon polyps     next colonoscopy due around 2018  . HTN (hypertension)   . Alcohol abuse, in remission     remote  . Benign essential tremor     improved on metoprolol and gabapentin  . Dyslipidemia     mild off meds  . Chronic low back pain     MRI 2004, multilevel DDD  . BPH (benign prostatic hypertrophy) 12/30/2012  . Personal history of tobacco use, presenting hazards to  health 02/18/2015    quit 06/2011, restarted 2014  . Multiple pulmonary nodules 02/2015  . COPD (chronic obstructive pulmonary disease) ([place])   . GERD (gastroesophageal reflux disease)   . Arthritis     BACK AND NECK  . Shortness of breath dyspnea     OCC WITH EXERTION  . Cardiac arrhythmia 03/2010    h/o a flutter and CM, s/p ablation, normal stress test  . Primary cancer of bladder ([place]) 2016    [place]  . Primary lung cancer ([place]) 2016  . Complication of anesthesia     DID NOT GET COMPLETELY NUMB WITH TONSILLECTOMY    SURGICAL HISTORY: Past Surgical History  Procedure Laterality Date  . Hemorroidectomy  1982  . A flutter ablation  03/2010  . Cataract extraction    . Carotid US  03/2010    WNL  . Colonoscopy  09/2011    polyps, rpt due 5 yrs, mild diverticulosis [name])  . Tonsillectomy  1964  . Electromagnetic navigation brochoscopy N/A 03/02/2015    Procedure: ELECTROMAGNETIC NAVIGATION BRONCHOSCOPY;  Surgeon: [name], MD;  Location: [facility];  Service: Cardiopulmonary;  Laterality: N/A;  . Transurethral resection of bladder tumor N/A 04/06/2015    Procedure: TRANSURETHRAL RESECTION OF BLADDER TUMOR (TURBT) right ureteral stent placement;  Surgeon: [name], MD;  Location: [facility];  Service: Urology;  Laterality: N/A;  . Cystoscopy w/ retrogrades Bilateral 04/06/2015    Procedure: CYSTOSCOPY WITH RETROGRADE PYELOGRAM;  Surgeon: [name], MD;  Location: [facility];  Service: Urology;  Laterality: Bilateral;  . Urinary stent removal  04/14/15  . Transurethral resection of bladder tumor N/A 07/13/2015    Procedure: TRANSURETHRAL RESECTION OF BLADDER TUMOR (TURBT);  Surgeon: [name], MD;  Location: [facility];  Service: Urology;  Laterality: N/A;  . Portacath placement N/A 08/10/2015    Procedure: INSERTION PORT-A-CATH;  Surgeon: [name], MD;  Location: [facility];  Service: General;  Laterality: N/A;    SOCIAL HISTORY: Patient is retired from  Press photographer. He lives in [place]. Social History   Social History  . Marital Status: Married    Spouse Name: N/A  . Number of Children: N/A  . Years of Education: N/A   Occupational History  . Not on file.   Social History Main Topics  . Smoking status: Former Smoker -- 1.50 packs/day for 61 years    Types: Cigarettes    Quit date: 03/08/2015  . Smokeless tobacco: Never Used     Comment: cut back 0.5 cigarettes--stopped 03/07/15; now now chantix  . Alcohol Use: No     Comment: has not drank in 37 years  . Drug Use: No  . Sexual Activity: Not on file   Other Topics Concern  . Not on file   Social History Narrative   Caffeine: 1 cup decaf/day   Lives with wife   Occupation: retired, worked Press photographer   Activity: gym 3x/wk, Immunologist, hobbies (paint, building things)   Diet: healthy, good water, fruits/vegetables daily, red meat 1x/wk    FAMILY HISTORY: Family History  Problem Relation Age of Onset  . Cervical cancer Mother 25  . Hypertension Father   . Stroke Brother 49  . Tremor Father     and several uncles/aunts (not parkinson's)  . Bladder Cancer Brother 37  . Prostate cancer Neg Hx   . Kidney cancer Neg Hx     ALLERGIES:  has No Known Allergies.  MEDICATIONS:  Current Outpatient Prescriptions  Medication Sig Dispense Refill  . albuterol (PROVENTIL HFA;VENTOLIN HFA) 108 (90 Base) MCG/ACT inhaler Inhale 2 puffs into the lungs every 6 (six) hours as needed for wheezing or shortness of breath. 1 Inhaler 2  . famotidine (PEPCID) 10 MG tablet Take 10 mg by mouth 2 (two) times daily.     . fluticasone furoate-vilanterol (BREO ELLIPTA) 100-25 MCG/INH AEPB Inhale 1 puff into the lungs daily. (Patient taking differently: Inhale 1 puff into the lungs every morning. ) 60 each 5  . gabapentin (NEURONTIN) 100 MG capsule Take 100 mg by mouth 2 (two) times daily. AND PRN FOR ANY ADDITIONAL TREMORS    . lidocaine-prilocaine (EMLA) cream Apply 1 application topically as needed. To  port a cath site 30 g 6  . lisinopril (PRINIVIL,ZESTRIL) 10 MG tablet Take 1 tablet (10 mg total) by mouth daily. (Patient taking differently: Take 10 mg by mouth every evening. ) 90 tablet 3  . loratadine (CLARITIN) 10 MG tablet Take 10 mg by mouth daily as needed for allergies.    . metoprolol tartrate (LOPRESSOR) 25 MG tablet Take 0.5 tablets (12.5 mg total) by mouth 2 (two) times daily. 90 tablet 3  . Multiple Vitamins-Minerals (MULTIVITAMIN WITH MINERALS) tablet Take 1 tablet by mouth daily.    . naproxen sodium (ALEVE) 220 MG tablet Take 220 mg by mouth 2 (two) times daily with a meal. Reported on 08/10/2015    . ondansetron (ZOFRAN) 8 MG tablet Take 1 tablet (8 mg  total) by mouth every 8 (eight) hours as needed for nausea or vomiting (start 3 days; after chemo). 40 tablet 0  . prochlorperazine (COMPAZINE) 10 MG tablet Take 1 tablet (10 mg total) by mouth every 6 (six) hours as needed for nausea or vomiting. 40 tablet 0  . umeclidinium bromide (INCRUSE ELLIPTA) 62.5 MCG/INH AEPB Inhale 1 puff into the lungs daily. (Patient taking differently: Inhale 1 puff into the lungs every morning. ) 30 each 5  . HYDROcodone-acetaminophen (NORCO) 5-325 MG tablet Take 1 tablet by mouth every 8 (eight) hours as needed for moderate pain. 30 tablet 0   No current facility-administered medications for this visit.   Facility-Administered Medications Ordered in Other Visits  Medication Dose Route Frequency Provider Last Rate Last Dose  . heparin lock flush 100 unit/mL  500 Units Intravenous Once Cammie Sickle, MD      .  PHYSICAL EXAMINATION: ECOG PERFORMANCE STATUS: 0 - Asymptomatic  Filed Vitals:   09/01/15 1008  BP: 133/68  Pulse: 71  Temp: 97 F (36.1 C)  Resp: 18   Filed Weights   09/01/15 1008  Weight: 212 lb 8.4 oz (96.4 kg)    GENERAL:alert, no distress and comfortable. He is accompanied by his wife.  SKIN: skin color, texture, turgor are normal, no rashes or significant  lesions EYES: normal, conjunctiva are pink and non-injected, sclera clear OROPHARYNX:no exudate, no erythema and lips, buccal mucosa, and tongue normal  NECK: supple, thyroid normal size, non-tender, without nodularity LYMPH:  no palpable lymphadenopathy in the cervical, axillary or inguinal LUNGS: clear to auscultation and percussion with normal breathing effort HEART: regular rate & rhythm and no murmurs and no lower extremity edema ABDOMEN:abdomen soft, non-tender and normal bowel sounds Musculoskeletal:no cyanosis of digits and no clubbing  PSYCH: alert & oriented x 3 with fluent speech NEURO: no focal motor/sensory deficits  LABORATORY DATA:  I have reviewed the data as listed Lab Results  Component Value Date   WBC 7.1 09/01/2015   HGB 12.7* 09/01/2015   HCT 36.4* 09/01/2015   MCV 92.8 09/01/2015   PLT 225 09/01/2015    Recent Labs  07/18/15 1007 08/11/15 0914 08/25/15 0931 09/01/15 0918  NA 135 133* 136 138  K 4.0 3.9 4.2 4.3  CL 101 100* 101 103  CO2 '30 28 28 28  ' GLUCOSE 165* 161* 136* 148*  BUN 27* 26* 30* 29*  CREATININE 0.92 0.91 0.98 0.87  CALCIUM 8.9 8.4* 8.8* 8.7*  GFRNONAA >60 >60 >60 >60  GFRAA >60 >60 >60 >60  PROT 7.8 7.7  --  7.5  ALBUMIN 4.1 3.7  --  3.8  AST 25 22  --  25  ALT 28 26  --  29  ALKPHOS 99 95  --  96  BILITOT 0.4 0.4  --  0.5    RADIOGRAPHIC STUDIES:  ASSESSMENT & PLAN:   # Right UL POORLY DIFF CA- T3 N0  Currently status post radiation [finished feb 2017];  PR  On CT scan.  Proceed with adjuvant chemotherapy carbotaxol 4; Status post cycle #1. He did well with the chemotherapy except for body aches muscle aches and joint pains.  # proceed with cycle #2 of carbotaxol today. CBC CMP normal.  # body aches muscle- joint pains- secondary to Neulasta. Patient is on Claritin. Motrin every 12 hours as needed. Hydrocodone every 8 hours as needed. new prescription given.  # Abdominal discomfort/dyspepsia recommend Prilosec once a  day.  # CBC BMP in 10  days follow-up with me in 3 weeks/CBC CMP chemotherapy.  # 25 minutes face-to-face with the patient discussing the above plan of care; more than 50% of spent on prognosis/ natural history; counseling and coordination.    Cammie Sickle, MD 09/01/2015 10:49 AM

## 2015-09-01 NOTE — Progress Notes (Signed)
Pt did develop tingling in fingers more on right than left. dide have lots of bone and joint pain. The first night after chemo pt had really bad indigestion and took lots ot tums/rolaids. Tremors got bad the first night also.  Some constipation.

## 2015-09-12 ENCOUNTER — Inpatient Hospital Stay: Payer: Medicare Other | Attending: Internal Medicine

## 2015-09-12 DIAGNOSIS — Z79899 Other long term (current) drug therapy: Secondary | ICD-10-CM | POA: Diagnosis not present

## 2015-09-12 DIAGNOSIS — C3411 Malignant neoplasm of upper lobe, right bronchus or lung: Secondary | ICD-10-CM | POA: Diagnosis not present

## 2015-09-12 DIAGNOSIS — I10 Essential (primary) hypertension: Secondary | ICD-10-CM | POA: Diagnosis not present

## 2015-09-12 DIAGNOSIS — I499 Cardiac arrhythmia, unspecified: Secondary | ICD-10-CM | POA: Diagnosis not present

## 2015-09-12 DIAGNOSIS — N400 Enlarged prostate without lower urinary tract symptoms: Secondary | ICD-10-CM | POA: Insufficient documentation

## 2015-09-12 DIAGNOSIS — M545 Low back pain: Secondary | ICD-10-CM | POA: Insufficient documentation

## 2015-09-12 DIAGNOSIS — Z8052 Family history of malignant neoplasm of bladder: Secondary | ICD-10-CM | POA: Diagnosis not present

## 2015-09-12 DIAGNOSIS — E785 Hyperlipidemia, unspecified: Secondary | ICD-10-CM | POA: Diagnosis not present

## 2015-09-12 DIAGNOSIS — K219 Gastro-esophageal reflux disease without esophagitis: Secondary | ICD-10-CM | POA: Diagnosis not present

## 2015-09-12 DIAGNOSIS — Z87891 Personal history of nicotine dependence: Secondary | ICD-10-CM | POA: Diagnosis not present

## 2015-09-12 DIAGNOSIS — Z5111 Encounter for antineoplastic chemotherapy: Secondary | ICD-10-CM | POA: Diagnosis not present

## 2015-09-12 DIAGNOSIS — C3491 Malignant neoplasm of unspecified part of right bronchus or lung: Secondary | ICD-10-CM

## 2015-09-12 DIAGNOSIS — G8929 Other chronic pain: Secondary | ICD-10-CM | POA: Diagnosis not present

## 2015-09-12 DIAGNOSIS — M129 Arthropathy, unspecified: Secondary | ICD-10-CM | POA: Insufficient documentation

## 2015-09-12 DIAGNOSIS — F1021 Alcohol dependence, in remission: Secondary | ICD-10-CM | POA: Insufficient documentation

## 2015-09-12 DIAGNOSIS — J449 Chronic obstructive pulmonary disease, unspecified: Secondary | ICD-10-CM | POA: Diagnosis not present

## 2015-09-12 DIAGNOSIS — Z86010 Personal history of colonic polyps: Secondary | ICD-10-CM | POA: Diagnosis not present

## 2015-09-12 DIAGNOSIS — Z808 Family history of malignant neoplasm of other organs or systems: Secondary | ICD-10-CM | POA: Diagnosis not present

## 2015-09-12 DIAGNOSIS — Z8551 Personal history of malignant neoplasm of bladder: Secondary | ICD-10-CM | POA: Diagnosis not present

## 2015-09-12 DIAGNOSIS — Z7689 Persons encountering health services in other specified circumstances: Secondary | ICD-10-CM | POA: Diagnosis not present

## 2015-09-12 DIAGNOSIS — R251 Tremor, unspecified: Secondary | ICD-10-CM | POA: Insufficient documentation

## 2015-09-12 LAB — COMPREHENSIVE METABOLIC PANEL
ALBUMIN: 3.8 g/dL (ref 3.5–5.0)
ALT: 38 U/L (ref 17–63)
AST: 27 U/L (ref 15–41)
Alkaline Phosphatase: 131 U/L — ABNORMAL HIGH (ref 38–126)
Anion gap: 6 (ref 5–15)
BUN: 22 mg/dL — AB (ref 6–20)
CHLORIDE: 105 mmol/L (ref 101–111)
CO2: 27 mmol/L (ref 22–32)
CREATININE: 0.83 mg/dL (ref 0.61–1.24)
Calcium: 8.4 mg/dL — ABNORMAL LOW (ref 8.9–10.3)
GFR calc Af Amer: >60 mL/min (ref >60)
GFR calc non Af Amer: >60 mL/min (ref >60)
Glucose, Bld: 151 mg/dL — ABNORMAL HIGH (ref 65–99)
POTASSIUM: 4 mmol/L (ref 3.5–5.1)
SODIUM: 138 mmol/L (ref 135–145)
Total Bilirubin: 0.5 mg/dL (ref 0.3–1.2)
Total Protein: 7.1 g/dL (ref 6.5–8.1)

## 2015-09-12 LAB — CBC WITH DIFFERENTIAL/PLATELET
BASOS ABS: 0.2 10*3/uL — AB (ref 0–0.1)
BASOS PCT: 1 %
EOS PCT: 1 %
Eosinophils Absolute: 0.1 10*3/uL (ref 0–0.7)
HCT: 33.5 % — ABNORMAL LOW (ref 40.0–52.0)
Hemoglobin: 11.5 g/dL — ABNORMAL LOW (ref 13.0–18.0)
Lymphocytes Relative: 11 %
Lymphs Abs: 1.6 10*3/uL (ref 1.0–3.6)
MCH: 31.8 pg (ref 26.0–34.0)
MCHC: 34.4 g/dL (ref 32.0–36.0)
MCV: 92.6 fL (ref 80.0–100.0)
MONO ABS: 0.9 10*3/uL (ref 0.2–1.0)
Monocytes Relative: 6 %
Neutro Abs: 11.5 10*3/uL — ABNORMAL HIGH (ref 1.4–6.5)
Neutrophils Relative %: 81 %
PLATELETS: 107 10*3/uL — AB (ref 150–440)
RBC: 3.61 MIL/uL — ABNORMAL LOW (ref 4.40–5.90)
RDW: 14.4 % (ref 11.5–14.5)
WBC: 14.2 10*3/uL — ABNORMAL HIGH (ref 3.8–10.6)

## 2015-09-16 DIAGNOSIS — J449 Chronic obstructive pulmonary disease, unspecified: Secondary | ICD-10-CM | POA: Diagnosis not present

## 2015-09-20 ENCOUNTER — Other Ambulatory Visit: Payer: Self-pay | Admitting: *Deleted

## 2015-09-20 DIAGNOSIS — C3490 Malignant neoplasm of unspecified part of unspecified bronchus or lung: Secondary | ICD-10-CM

## 2015-09-22 ENCOUNTER — Inpatient Hospital Stay: Payer: Medicare Other

## 2015-09-22 ENCOUNTER — Inpatient Hospital Stay (HOSPITAL_BASED_OUTPATIENT_CLINIC_OR_DEPARTMENT_OTHER): Payer: Medicare Other | Admitting: Internal Medicine

## 2015-09-22 VITALS — BP 139/61 | HR 71 | Temp 98.6°F | Resp 18 | Ht 69.0 in | Wt 212.7 lb

## 2015-09-22 DIAGNOSIS — Z79899 Other long term (current) drug therapy: Secondary | ICD-10-CM | POA: Diagnosis not present

## 2015-09-22 DIAGNOSIS — C3411 Malignant neoplasm of upper lobe, right bronchus or lung: Secondary | ICD-10-CM

## 2015-09-22 DIAGNOSIS — I10 Essential (primary) hypertension: Secondary | ICD-10-CM

## 2015-09-22 DIAGNOSIS — M129 Arthropathy, unspecified: Secondary | ICD-10-CM

## 2015-09-22 DIAGNOSIS — Z8052 Family history of malignant neoplasm of bladder: Secondary | ICD-10-CM

## 2015-09-22 DIAGNOSIS — Z87891 Personal history of nicotine dependence: Secondary | ICD-10-CM | POA: Diagnosis not present

## 2015-09-22 DIAGNOSIS — E785 Hyperlipidemia, unspecified: Secondary | ICD-10-CM

## 2015-09-22 DIAGNOSIS — F1021 Alcohol dependence, in remission: Secondary | ICD-10-CM | POA: Diagnosis not present

## 2015-09-22 DIAGNOSIS — R251 Tremor, unspecified: Secondary | ICD-10-CM | POA: Diagnosis not present

## 2015-09-22 DIAGNOSIS — G8929 Other chronic pain: Secondary | ICD-10-CM | POA: Diagnosis not present

## 2015-09-22 DIAGNOSIS — N400 Enlarged prostate without lower urinary tract symptoms: Secondary | ICD-10-CM

## 2015-09-22 DIAGNOSIS — K219 Gastro-esophageal reflux disease without esophagitis: Secondary | ICD-10-CM

## 2015-09-22 DIAGNOSIS — M545 Low back pain: Secondary | ICD-10-CM | POA: Diagnosis not present

## 2015-09-22 DIAGNOSIS — Z8551 Personal history of malignant neoplasm of bladder: Secondary | ICD-10-CM | POA: Diagnosis not present

## 2015-09-22 DIAGNOSIS — C3490 Malignant neoplasm of unspecified part of unspecified bronchus or lung: Secondary | ICD-10-CM

## 2015-09-22 DIAGNOSIS — Z7689 Persons encountering health services in other specified circumstances: Secondary | ICD-10-CM

## 2015-09-22 DIAGNOSIS — Z5111 Encounter for antineoplastic chemotherapy: Secondary | ICD-10-CM | POA: Diagnosis not present

## 2015-09-22 DIAGNOSIS — I499 Cardiac arrhythmia, unspecified: Secondary | ICD-10-CM

## 2015-09-22 DIAGNOSIS — Z86010 Personal history of colonic polyps: Secondary | ICD-10-CM

## 2015-09-22 DIAGNOSIS — Z808 Family history of malignant neoplasm of other organs or systems: Secondary | ICD-10-CM | POA: Diagnosis not present

## 2015-09-22 DIAGNOSIS — J449 Chronic obstructive pulmonary disease, unspecified: Secondary | ICD-10-CM

## 2015-09-22 LAB — COMPREHENSIVE METABOLIC PANEL
ALBUMIN: 3.7 g/dL (ref 3.5–5.0)
ALT: 32 U/L (ref 17–63)
AST: 26 U/L (ref 15–41)
Alkaline Phosphatase: 113 U/L (ref 38–126)
Anion gap: 5 (ref 5–15)
BUN: 22 mg/dL — AB (ref 6–20)
CHLORIDE: 106 mmol/L (ref 101–111)
CO2: 27 mmol/L (ref 22–32)
CREATININE: 0.76 mg/dL (ref 0.61–1.24)
Calcium: 8.6 mg/dL — ABNORMAL LOW (ref 8.9–10.3)
GFR calc Af Amer: >60 mL/min (ref >60)
GLUCOSE: 167 mg/dL — AB (ref 65–99)
POTASSIUM: 3.9 mmol/L (ref 3.5–5.1)
Sodium: 138 mmol/L (ref 135–145)
Total Bilirubin: 0.5 mg/dL (ref 0.3–1.2)
Total Protein: 7.4 g/dL (ref 6.5–8.1)

## 2015-09-22 LAB — CBC WITH DIFFERENTIAL/PLATELET
Basophils Absolute: 0.1 10*3/uL (ref 0–0.1)
Basophils Relative: 1 %
Eosinophils Absolute: 0 10*3/uL (ref 0–0.7)
Eosinophils Relative: 1 %
HCT: 31.4 % — ABNORMAL LOW (ref 40.0–52.0)
Hemoglobin: 10.9 g/dL — ABNORMAL LOW (ref 13.0–18.0)
Lymphocytes Relative: 14 %
Lymphs Abs: 1 10*3/uL (ref 1.0–3.6)
MCH: 32.1 pg (ref 26.0–34.0)
MCHC: 34.6 g/dL (ref 32.0–36.0)
MCV: 92.5 fL (ref 80.0–100.0)
MONOS PCT: 10 %
Monocytes Absolute: 0.7 10*3/uL (ref 0.2–1.0)
NEUTROS ABS: 5.4 10*3/uL (ref 1.4–6.5)
NEUTROS PCT: 74 %
PLATELETS: 279 10*3/uL (ref 150–440)
RBC: 3.39 MIL/uL — AB (ref 4.40–5.90)
RDW: 14.7 % — ABNORMAL HIGH (ref 11.5–14.5)
WBC: 7.2 10*3/uL (ref 3.8–10.6)

## 2015-09-22 MED ORDER — DEXTROSE 5 % IV SOLN: 200.0000 mg/m2 | Freq: Once | INTRAVENOUS | Status: DC

## 2015-09-22 MED ORDER — SODIUM CHLORIDE 0.9 % IV SOLN
683.4000 mg | Freq: Once | INTRAVENOUS | Status: AC
  Administered 2015-09-22: 680 mg via INTRAVENOUS
  Filled 2015-09-22: qty 68

## 2015-09-22 MED ORDER — FAMOTIDINE IN NACL 20-0.9 MG/50ML-% IV SOLN
20.0000 mg | Freq: Once | INTRAVENOUS | Status: AC
  Administered 2015-09-22: 20 mg via INTRAVENOUS
  Filled 2015-09-22: qty 50

## 2015-09-22 MED ORDER — DIPHENHYDRAMINE HCL 50 MG/ML IJ SOLN
50.0000 mg | Freq: Once | INTRAMUSCULAR | Status: AC
  Administered 2015-09-22: 50 mg via INTRAVENOUS
  Filled 2015-09-22: qty 1

## 2015-09-22 MED ORDER — SODIUM CHLORIDE 0.9% FLUSH
10.0000 mL | INTRAVENOUS | Status: DC | PRN
  Administered 2015-09-22: 10 mL via INTRAVENOUS
  Filled 2015-09-22: qty 10

## 2015-09-22 MED ORDER — SODIUM CHLORIDE 0.9 % IV SOLN
438.0000 mg | Freq: Once | INTRAVENOUS | Status: AC
  Administered 2015-09-22: 438 mg via INTRAVENOUS
  Filled 2015-09-22: qty 73

## 2015-09-22 MED ORDER — HEPARIN SOD (PORK) LOCK FLUSH 100 UNIT/ML IV SOLN
500.0000 [IU] | Freq: Once | INTRAVENOUS | Status: AC
  Administered 2015-09-22: 500 [IU] via INTRAVENOUS
  Filled 2015-09-22: qty 5

## 2015-09-22 MED ORDER — PALONOSETRON HCL INJECTION 0.25 MG/5ML
0.2500 mg | Freq: Once | INTRAVENOUS | Status: AC
  Administered 2015-09-22: 0.25 mg via INTRAVENOUS
  Filled 2015-09-22: qty 5

## 2015-09-22 MED ORDER — PEGFILGRASTIM 6 MG/0.6ML ~~LOC~~ PSKT
6.0000 mg | PREFILLED_SYRINGE | Freq: Once | SUBCUTANEOUS | Status: AC
  Administered 2015-09-22: 6 mg via SUBCUTANEOUS
  Filled 2015-09-22: qty 0.6

## 2015-09-22 MED ORDER — SODIUM CHLORIDE 0.9 % IV SOLN
20.0000 mg | Freq: Once | INTRAVENOUS | Status: AC
  Administered 2015-09-22: 20 mg via INTRAVENOUS
  Filled 2015-09-22: qty 2

## 2015-09-22 MED ORDER — SODIUM CHLORIDE 0.9 % IV SOLN
Freq: Once | INTRAVENOUS | Status: AC
  Administered 2015-09-22: 09:00:00 via INTRAVENOUS
  Filled 2015-09-22: qty 1000

## 2015-09-22 NOTE — Progress Notes (Signed)
Use the 09/12/2015 treatment plan for today per Dr. Rogue Bussing.  LJ

## 2015-09-22 NOTE — Progress Notes (Signed)
[place] NOTE  Patient Care Team: [name], MD as PCP - General (Family Medicine)  CHIEF COMPLAINTS/PURPOSE OF CONSULTATION:   # OCT 2016- RUL POORLY DIFF CA [clinically non-small cell] ; STAGE IIB [T3-2 separate nodules in RUL; N0] [s/p CT guided bx]- s/p RT [finished mid jan 2017]; FEB 2017-CT scan- regression of RUL nodules; PDL-1 by IHC/keytruda- 11-20%.  # March 2017- Start carbo-taxol q 3 w x4  # OCT 2016- 2 sub cm nodules in Right LL- Feb CT 2017- STABLE  # OCT 2016-NON-invasive bladder urothelial cancer; high grade [Dr. [name]; s/p TURBT [Feb 2017- NEG]  # Pneumothorax [oct 2016 s/p CT Bx]   HISTORY OF PRESENTING ILLNESS:  Joseph Graham 75 y.o. male with a history of long-standing smoking/COPD-  Non-small cell/ poorly differentiated malignancy of the right upper lobe  T3 N0 status post radiation is here to proceed with adjuvant chemotherapy with carbo-taxol; is currently status post cycle #2 of carbotaxol.  Since patient started Claritin/hydrocodone- bodyaches/muscle pain improved- status post Neulasta. No nausea no vomiting.  He complains of mild tingling and numbness of his fingertips and feet approximately week or so after the chemotherapy. However currently resolved. Patient is currently on Neurontin.  Abdominal discomfort improved since starting on Prilosec. Complains of weight gain.  ROS: A complete 10 point review of system is done which is negative for mentioned above in history of present illness.  MEDICAL HISTORY:  Past Medical History  Diagnosis Date  . Colon polyps     next colonoscopy due around 2018  . HTN (hypertension)   . Alcohol abuse, in remission     remote  . Benign essential tremor     improved on metoprolol and gabapentin  . Dyslipidemia     mild off meds  . Chronic low back pain     MRI 2004, multilevel DDD  . BPH (benign prostatic hypertrophy) 12/30/2012  . Personal history of tobacco use, presenting  hazards to health 02/18/2015    quit 06/2011, restarted 2014  . Multiple pulmonary nodules 02/2015  . COPD (chronic obstructive pulmonary disease) ([place])   . GERD (gastroesophageal reflux disease)   . Arthritis     BACK AND NECK  . Shortness of breath dyspnea     OCC WITH EXERTION  . Cardiac arrhythmia 03/2010    h/o a flutter and CM, s/p ablation, normal stress test  . Primary cancer of bladder ([place]) 2016    Budzyn  . Primary lung cancer ([place]) 2016  . Complication of anesthesia     DID NOT GET COMPLETELY NUMB WITH TONSILLECTOMY    SURGICAL HISTORY: Past Surgical History  Procedure Laterality Date  . Hemorroidectomy  1982  . A flutter ablation  03/2010  . Cataract extraction    . Carotid US  03/2010    WNL  . Colonoscopy  09/2011    polyps, rpt due 5 yrs, mild diverticulosis [name])  . Tonsillectomy  1964  . Electromagnetic navigation brochoscopy N/A 03/02/2015    Procedure: ELECTROMAGNETIC NAVIGATION BRONCHOSCOPY;  Surgeon: [name], MD;  Location: [facility] ORS;  Service: Cardiopulmonary;  Laterality: N/A;  . Transurethral resection of bladder tumor N/A 04/06/2015    Procedure: TRANSURETHRAL RESECTION OF BLADDER TUMOR (TURBT) right ureteral stent placement;  Surgeon: [name], MD;  Location: [facility] ORS;  Service: Urology;  Laterality: N/A;  . Cystoscopy w/ retrogrades Bilateral 04/06/2015    Procedure: CYSTOSCOPY WITH RETROGRADE PYELOGRAM;  Surgeon: [name], MD;  Location: [facility]  ORS;  Service: Urology;  Laterality: Bilateral;  . Urinary stent removal  04/14/15  . Transurethral resection of bladder tumor N/A 07/13/2015    Procedure: TRANSURETHRAL RESECTION OF BLADDER TUMOR (TURBT);  Surgeon: [name], MD;  Location: [facility] ORS;  Service: Urology;  Laterality: N/A;  . Portacath placement N/A 08/10/2015    Procedure: INSERTION PORT-A-CATH;  Surgeon: [name], MD;  Location: [facility] ORS;  Service: General;  Laterality: N/A;    SOCIAL HISTORY: Patient is  retired from [employer]. He lives in [place]. Social History   Social History  . Marital Status: Married    Spouse Name: N/A  . Number of Children: N/A  . Years of Education: N/A   Occupational History  . Not on file.   Social History Main Topics  . Smoking status: Former Smoker -- 1.50 packs/day for 61 years    Types: Cigarettes    Quit date: 03/08/2015  . Smokeless tobacco: Never Used     Comment: cut back 0.5 cigarettes--stopped 03/07/15; now now chantix  . Alcohol Use: No     Comment: has not drank in 37 years  . Drug Use: No  . Sexual Activity: Not on file   Other Topics Concern  . Not on file   Social History Narrative   Caffeine: 1 cup decaf/day   Lives with wife   Occupation: retired, worked Press photographer   Activity: gym 3x/wk, Immunologist, hobbies (paint, building things)   Diet: healthy, good water, fruits/vegetables daily, red meat 1x/wk    FAMILY HISTORY: Family History  Problem Relation Age of Onset  . Cervical cancer Mother 25  . Hypertension Father   . Stroke Brother 76  . Tremor Father     and several uncles/aunts (not parkinson's)  . Bladder Cancer Brother 60  . Prostate cancer Neg Hx   . Kidney cancer Neg Hx     ALLERGIES:  has No Known Allergies.  MEDICATIONS:  Current Outpatient Prescriptions  Medication Sig Dispense Refill  . albuterol (PROVENTIL HFA;VENTOLIN HFA) 108 (90 Base) MCG/ACT inhaler Inhale 2 puffs into the lungs every 6 (six) hours as needed for wheezing or shortness of breath. 1 Inhaler 2  . famotidine (PEPCID) 10 MG tablet Take 10 mg by mouth 2 (two) times daily.     . fluticasone furoate-vilanterol (BREO ELLIPTA) 100-25 MCG/INH AEPB Inhale 1 puff into the lungs daily. (Patient taking differently: Inhale 1 puff into the lungs every morning. ) 60 each 5  . gabapentin (NEURONTIN) 100 MG capsule Take 100 mg by mouth 2 (two) times daily. AND PRN FOR ANY ADDITIONAL TREMORS    . HYDROcodone-acetaminophen (NORCO) 5-325 MG tablet Take 1 tablet by  mouth every 8 (eight) hours as needed for moderate pain. 30 tablet 0  . lidocaine-prilocaine (EMLA) cream Apply 1 application topically as needed. To port a cath site 30 g 6  . lisinopril (PRINIVIL,ZESTRIL) 10 MG tablet Take 1 tablet (10 mg total) by mouth daily. (Patient taking differently: Take 10 mg by mouth every evening. ) 90 tablet 3  . loratadine (CLARITIN) 10 MG tablet Take 10 mg by mouth daily as needed for allergies.    . metoprolol tartrate (LOPRESSOR) 25 MG tablet Take 0.5 tablets (12.5 mg total) by mouth 2 (two) times daily. 90 tablet 3  . Multiple Vitamins-Minerals (MULTIVITAMIN WITH MINERALS) tablet Take 1 tablet by mouth daily.    . naproxen sodium (ALEVE) 220 MG tablet Take 220 mg by mouth 2 (two) times daily with a meal. Reported  on 08/10/2015    . ondansetron (ZOFRAN) 8 MG tablet Take 1 tablet (8 mg total) by mouth every 8 (eight) hours as needed for nausea or vomiting (start 3 days; after chemo). 40 tablet 0  . prochlorperazine (COMPAZINE) 10 MG tablet Take 1 tablet (10 mg total) by mouth every 6 (six) hours as needed for nausea or vomiting. 40 tablet 0  . umeclidinium bromide (INCRUSE ELLIPTA) 62.5 MCG/INH AEPB Inhale 1 puff into the lungs daily. (Patient taking differently: Inhale 1 puff into the lungs every morning. ) 30 each 5   No current facility-administered medications for this visit.  Marland Kitchen  PHYSICAL EXAMINATION: ECOG PERFORMANCE STATUS: 0 - Asymptomatic  Filed Vitals:   09/22/15 0845  BP: 139/61  Pulse: 71  Temp: 98.6 F (37 C)  Resp: 18   Filed Weights   09/22/15 0845  Weight: 212 lb 11.9 oz (96.5 kg)    GENERAL:alert, no distress and comfortable. He is alone.  SKIN: skin color, texture, turgor are normal, no rashes or significant lesions EYES: normal, conjunctiva are pink and non-injected, sclera clear OROPHARYNX:no exudate, no erythema and lips, buccal mucosa, and tongue normal  NECK: supple, thyroid normal size, non-tender, without nodularity LYMPH:  no  palpable lymphadenopathy in the cervical, axillary or inguinal LUNGS: clear to auscultation and percussion with normal breathing effort HEART: regular rate & rhythm and no murmurs and no lower extremity edema ABDOMEN:abdomen soft, non-tender and normal bowel sounds Musculoskeletal:no cyanosis of digits and no clubbing  PSYCH: alert & oriented x 3 with fluent speech NEURO: no focal motor/sensory deficits  LABORATORY DATA:  I have reviewed the data as listed Lab Results  Component Value Date   WBC 7.2 09/22/2015   HGB 10.9* 09/22/2015   HCT 31.4* 09/22/2015   MCV 92.5 09/22/2015   PLT 279 09/22/2015    Recent Labs  09/01/15 0918 09/12/15 0935 09/22/15 0829  NA 138 138 138  K 4.3 4.0 3.9  CL 103 105 106  CO2 _0 GLUCOSE 148* 151* 167*  BUN 29* 22* 22*  CREATININE 0.87 0.83 0.76  CALCIUM 8.7* 8.4* 8.6*  GFRNONAA >60 >60 >60  GFRAA >60 >60 >60  PROT 7.5 7.1 7.4  ALBUMIN 3.8 3.8 3.7  AST _1 ALT 29 38 32  ALKPHOS 96 131* 113  BILITOT 0.5 0.5 0.5    RADIOGRAPHIC STUDIES:  ASSESSMENT & PLAN:   # Right UL POORLY DIFF CA- T3 N0  Currently status post radiation [finished feb 2017];  PR  On CT scan.  Currently on adjuvant chemotherapy carbotaxol 4; Status post cycle #2. He did well with the chemotherapy except for body aches muscle aches and joint pains; Mild tingling and numbness  # proceed with cycle #3 of carbotaxol today. CBC-mild anemia hemoglobin 10.9 otherwise CMP normal.  # body aches muscle- joint pains- secondary to Neulasta. Improved.  # Tingling and numbness grade 1- fingertips and toes. From Taxol. Currently resolved. Patient is on Neurontin. If this continues to problem recommend dose reduction of Taxol.  # CBC BMP in 10 days follow-up with me in 3 weeks/CBC CMP chemotherapy.     Cammie Sickle, MD 09/22/2015 9:08 AM

## 2015-09-23 ENCOUNTER — Other Ambulatory Visit: Payer: Self-pay | Admitting: Internal Medicine

## 2015-09-23 ENCOUNTER — Inpatient Hospital Stay: Payer: Medicare Other

## 2015-09-23 DIAGNOSIS — T451X5A Adverse effect of antineoplastic and immunosuppressive drugs, initial encounter: Secondary | ICD-10-CM

## 2015-09-23 DIAGNOSIS — Z808 Family history of malignant neoplasm of other organs or systems: Secondary | ICD-10-CM | POA: Diagnosis not present

## 2015-09-23 DIAGNOSIS — Z86010 Personal history of colonic polyps: Secondary | ICD-10-CM | POA: Diagnosis not present

## 2015-09-23 DIAGNOSIS — I499 Cardiac arrhythmia, unspecified: Secondary | ICD-10-CM | POA: Diagnosis not present

## 2015-09-23 DIAGNOSIS — J449 Chronic obstructive pulmonary disease, unspecified: Secondary | ICD-10-CM | POA: Diagnosis not present

## 2015-09-23 DIAGNOSIS — K219 Gastro-esophageal reflux disease without esophagitis: Secondary | ICD-10-CM | POA: Diagnosis not present

## 2015-09-23 DIAGNOSIS — E785 Hyperlipidemia, unspecified: Secondary | ICD-10-CM | POA: Diagnosis not present

## 2015-09-23 DIAGNOSIS — D701 Agranulocytosis secondary to cancer chemotherapy: Secondary | ICD-10-CM

## 2015-09-23 DIAGNOSIS — I10 Essential (primary) hypertension: Secondary | ICD-10-CM | POA: Diagnosis not present

## 2015-09-23 DIAGNOSIS — Z8052 Family history of malignant neoplasm of bladder: Secondary | ICD-10-CM | POA: Diagnosis not present

## 2015-09-23 DIAGNOSIS — M129 Arthropathy, unspecified: Secondary | ICD-10-CM | POA: Diagnosis not present

## 2015-09-23 DIAGNOSIS — Z5111 Encounter for antineoplastic chemotherapy: Secondary | ICD-10-CM | POA: Diagnosis not present

## 2015-09-23 DIAGNOSIS — M545 Low back pain: Secondary | ICD-10-CM | POA: Diagnosis not present

## 2015-09-23 DIAGNOSIS — Z7689 Persons encountering health services in other specified circumstances: Secondary | ICD-10-CM | POA: Diagnosis not present

## 2015-09-23 DIAGNOSIS — N400 Enlarged prostate without lower urinary tract symptoms: Secondary | ICD-10-CM | POA: Diagnosis not present

## 2015-09-23 DIAGNOSIS — C3411 Malignant neoplasm of upper lobe, right bronchus or lung: Secondary | ICD-10-CM

## 2015-09-23 DIAGNOSIS — Z79899 Other long term (current) drug therapy: Secondary | ICD-10-CM | POA: Diagnosis not present

## 2015-09-23 DIAGNOSIS — R251 Tremor, unspecified: Secondary | ICD-10-CM | POA: Diagnosis not present

## 2015-09-23 DIAGNOSIS — Z87891 Personal history of nicotine dependence: Secondary | ICD-10-CM | POA: Diagnosis not present

## 2015-09-23 DIAGNOSIS — Z8551 Personal history of malignant neoplasm of bladder: Secondary | ICD-10-CM | POA: Diagnosis not present

## 2015-09-23 DIAGNOSIS — G8929 Other chronic pain: Secondary | ICD-10-CM | POA: Diagnosis not present

## 2015-09-23 HISTORY — DX: Agranulocytosis secondary to cancer chemotherapy: T45.1X5A

## 2015-09-23 HISTORY — DX: Agranulocytosis secondary to cancer chemotherapy: D70.1

## 2015-09-23 MED ORDER — PEGFILGRASTIM INJECTION 6 MG/0.6ML ~~LOC~~
PREFILLED_SYRINGE | SUBCUTANEOUS | Status: AC
  Filled 2015-09-23: qty 0.6

## 2015-09-23 MED ORDER — PEGFILGRASTIM INJECTION 6 MG/0.6ML
6.0000 mg | Freq: Once | SUBCUTANEOUS | Status: AC
  Administered 2015-09-23: 6 mg via SUBCUTANEOUS
  Filled 2015-09-23: qty 0.6

## 2015-09-23 NOTE — Progress Notes (Signed)
Pt had OnBody Neulasta injector placed yesterday 4/13. Pt states it fell off this morning. Pt came to clinic this afternoon for Neulasta injection.

## 2015-09-29 LAB — SURGICAL PATHOLOGY

## 2015-10-03 ENCOUNTER — Inpatient Hospital Stay: Payer: Medicare Other

## 2015-10-03 DIAGNOSIS — G8929 Other chronic pain: Secondary | ICD-10-CM | POA: Diagnosis not present

## 2015-10-03 DIAGNOSIS — Z808 Family history of malignant neoplasm of other organs or systems: Secondary | ICD-10-CM | POA: Diagnosis not present

## 2015-10-03 DIAGNOSIS — Z87891 Personal history of nicotine dependence: Secondary | ICD-10-CM | POA: Diagnosis not present

## 2015-10-03 DIAGNOSIS — Z8052 Family history of malignant neoplasm of bladder: Secondary | ICD-10-CM | POA: Diagnosis not present

## 2015-10-03 DIAGNOSIS — I10 Essential (primary) hypertension: Secondary | ICD-10-CM | POA: Diagnosis not present

## 2015-10-03 DIAGNOSIS — N400 Enlarged prostate without lower urinary tract symptoms: Secondary | ICD-10-CM | POA: Diagnosis not present

## 2015-10-03 DIAGNOSIS — Z8551 Personal history of malignant neoplasm of bladder: Secondary | ICD-10-CM | POA: Diagnosis not present

## 2015-10-03 DIAGNOSIS — E785 Hyperlipidemia, unspecified: Secondary | ICD-10-CM | POA: Diagnosis not present

## 2015-10-03 DIAGNOSIS — Z79899 Other long term (current) drug therapy: Secondary | ICD-10-CM | POA: Diagnosis not present

## 2015-10-03 DIAGNOSIS — M545 Low back pain: Secondary | ICD-10-CM | POA: Diagnosis not present

## 2015-10-03 DIAGNOSIS — M129 Arthropathy, unspecified: Secondary | ICD-10-CM | POA: Diagnosis not present

## 2015-10-03 DIAGNOSIS — C3411 Malignant neoplasm of upper lobe, right bronchus or lung: Secondary | ICD-10-CM | POA: Diagnosis not present

## 2015-10-03 DIAGNOSIS — Z7689 Persons encountering health services in other specified circumstances: Secondary | ICD-10-CM | POA: Diagnosis not present

## 2015-10-03 DIAGNOSIS — Z86010 Personal history of colonic polyps: Secondary | ICD-10-CM | POA: Diagnosis not present

## 2015-10-03 DIAGNOSIS — Z5111 Encounter for antineoplastic chemotherapy: Secondary | ICD-10-CM | POA: Diagnosis not present

## 2015-10-03 DIAGNOSIS — C3490 Malignant neoplasm of unspecified part of unspecified bronchus or lung: Secondary | ICD-10-CM

## 2015-10-03 DIAGNOSIS — J449 Chronic obstructive pulmonary disease, unspecified: Secondary | ICD-10-CM | POA: Diagnosis not present

## 2015-10-03 DIAGNOSIS — K219 Gastro-esophageal reflux disease without esophagitis: Secondary | ICD-10-CM | POA: Diagnosis not present

## 2015-10-03 DIAGNOSIS — I499 Cardiac arrhythmia, unspecified: Secondary | ICD-10-CM | POA: Diagnosis not present

## 2015-10-03 DIAGNOSIS — R251 Tremor, unspecified: Secondary | ICD-10-CM | POA: Diagnosis not present

## 2015-10-03 LAB — CBC WITH DIFFERENTIAL/PLATELET
BASOS ABS: 0.1 10*3/uL (ref 0–0.1)
Basophils Relative: 1 %
Eosinophils Absolute: 0.1 10*3/uL (ref 0–0.7)
Eosinophils Relative: 0 %
HEMATOCRIT: 31 % — AB (ref 40.0–52.0)
HEMOGLOBIN: 10.5 g/dL — AB (ref 13.0–18.0)
LYMPHS PCT: 11 %
Lymphs Abs: 1.9 10*3/uL (ref 1.0–3.6)
MCH: 31.7 pg (ref 26.0–34.0)
MCHC: 33.7 g/dL (ref 32.0–36.0)
MCV: 94 fL (ref 80.0–100.0)
Monocytes Absolute: 1 10*3/uL (ref 0.2–1.0)
Monocytes Relative: 6 %
NEUTROS ABS: 14.4 10*3/uL — AB (ref 1.4–6.5)
NEUTROS PCT: 82 %
PLATELETS: 147 10*3/uL — AB (ref 150–440)
RBC: 3.3 MIL/uL — AB (ref 4.40–5.90)
RDW: 15.9 % — ABNORMAL HIGH (ref 11.5–14.5)
WBC: 17.5 10*3/uL — AB (ref 3.8–10.6)

## 2015-10-13 ENCOUNTER — Inpatient Hospital Stay: Payer: Medicare Other | Attending: Internal Medicine

## 2015-10-13 ENCOUNTER — Inpatient Hospital Stay (HOSPITAL_BASED_OUTPATIENT_CLINIC_OR_DEPARTMENT_OTHER): Payer: Medicare Other | Admitting: Internal Medicine

## 2015-10-13 ENCOUNTER — Inpatient Hospital Stay: Payer: Medicare Other

## 2015-10-13 VITALS — BP 130/67 | HR 77 | Temp 97.8°F | Resp 22 | Wt 219.2 lb

## 2015-10-13 DIAGNOSIS — E785 Hyperlipidemia, unspecified: Secondary | ICD-10-CM | POA: Insufficient documentation

## 2015-10-13 DIAGNOSIS — G8929 Other chronic pain: Secondary | ICD-10-CM | POA: Diagnosis not present

## 2015-10-13 DIAGNOSIS — R5383 Other fatigue: Secondary | ICD-10-CM

## 2015-10-13 DIAGNOSIS — I499 Cardiac arrhythmia, unspecified: Secondary | ICD-10-CM | POA: Insufficient documentation

## 2015-10-13 DIAGNOSIS — G620 Drug-induced polyneuropathy: Secondary | ICD-10-CM

## 2015-10-13 DIAGNOSIS — C679 Malignant neoplasm of bladder, unspecified: Secondary | ICD-10-CM

## 2015-10-13 DIAGNOSIS — N400 Enlarged prostate without lower urinary tract symptoms: Secondary | ICD-10-CM | POA: Insufficient documentation

## 2015-10-13 DIAGNOSIS — J449 Chronic obstructive pulmonary disease, unspecified: Secondary | ICD-10-CM

## 2015-10-13 DIAGNOSIS — Z87891 Personal history of nicotine dependence: Secondary | ICD-10-CM

## 2015-10-13 DIAGNOSIS — M545 Low back pain: Secondary | ICD-10-CM | POA: Insufficient documentation

## 2015-10-13 DIAGNOSIS — T451X5A Adverse effect of antineoplastic and immunosuppressive drugs, initial encounter: Secondary | ICD-10-CM

## 2015-10-13 DIAGNOSIS — T451X5S Adverse effect of antineoplastic and immunosuppressive drugs, sequela: Secondary | ICD-10-CM | POA: Insufficient documentation

## 2015-10-13 DIAGNOSIS — M1990 Unspecified osteoarthritis, unspecified site: Secondary | ICD-10-CM

## 2015-10-13 DIAGNOSIS — F1010 Alcohol abuse, uncomplicated: Secondary | ICD-10-CM | POA: Insufficient documentation

## 2015-10-13 DIAGNOSIS — R0602 Shortness of breath: Secondary | ICD-10-CM

## 2015-10-13 DIAGNOSIS — G629 Polyneuropathy, unspecified: Secondary | ICD-10-CM | POA: Diagnosis not present

## 2015-10-13 DIAGNOSIS — Z5111 Encounter for antineoplastic chemotherapy: Secondary | ICD-10-CM | POA: Diagnosis not present

## 2015-10-13 DIAGNOSIS — C3411 Malignant neoplasm of upper lobe, right bronchus or lung: Secondary | ICD-10-CM | POA: Diagnosis not present

## 2015-10-13 DIAGNOSIS — M791 Myalgia: Secondary | ICD-10-CM | POA: Insufficient documentation

## 2015-10-13 DIAGNOSIS — C3491 Malignant neoplasm of unspecified part of right bronchus or lung: Secondary | ICD-10-CM

## 2015-10-13 DIAGNOSIS — R635 Abnormal weight gain: Secondary | ICD-10-CM | POA: Insufficient documentation

## 2015-10-13 DIAGNOSIS — I10 Essential (primary) hypertension: Secondary | ICD-10-CM | POA: Insufficient documentation

## 2015-10-13 DIAGNOSIS — D649 Anemia, unspecified: Secondary | ICD-10-CM | POA: Insufficient documentation

## 2015-10-13 DIAGNOSIS — C7801 Secondary malignant neoplasm of right lung: Secondary | ICD-10-CM

## 2015-10-13 DIAGNOSIS — K219 Gastro-esophageal reflux disease without esophagitis: Secondary | ICD-10-CM | POA: Insufficient documentation

## 2015-10-13 DIAGNOSIS — G250 Essential tremor: Secondary | ICD-10-CM | POA: Diagnosis not present

## 2015-10-13 DIAGNOSIS — Z86010 Personal history of colonic polyps: Secondary | ICD-10-CM | POA: Diagnosis not present

## 2015-10-13 DIAGNOSIS — C3490 Malignant neoplasm of unspecified part of unspecified bronchus or lung: Secondary | ICD-10-CM

## 2015-10-13 LAB — COMPREHENSIVE METABOLIC PANEL
ALBUMIN: 3.8 g/dL (ref 3.5–5.0)
ALK PHOS: 121 U/L (ref 38–126)
ALT: 35 U/L (ref 17–63)
ANION GAP: 8 (ref 5–15)
AST: 31 U/L (ref 15–41)
BILIRUBIN TOTAL: 0.5 mg/dL (ref 0.3–1.2)
BUN: 19 mg/dL (ref 6–20)
CALCIUM: 8.7 mg/dL — AB (ref 8.9–10.3)
CO2: 26 mmol/L (ref 22–32)
Chloride: 105 mmol/L (ref 101–111)
Creatinine, Ser: 0.76 mg/dL (ref 0.61–1.24)
GFR calc non Af Amer: >60 mL/min (ref >60)
Glucose, Bld: 180 mg/dL — ABNORMAL HIGH (ref 65–99)
POTASSIUM: 3.8 mmol/L (ref 3.5–5.1)
SODIUM: 139 mmol/L (ref 135–145)
TOTAL PROTEIN: 7.6 g/dL (ref 6.5–8.1)

## 2015-10-13 LAB — CBC WITH DIFFERENTIAL/PLATELET
BASOS ABS: 0 10*3/uL (ref 0–0.1)
BASOS PCT: 1 %
Eosinophils Absolute: 0 10*3/uL (ref 0–0.7)
Eosinophils Relative: 1 %
HEMATOCRIT: 29.9 % — AB (ref 40.0–52.0)
HEMOGLOBIN: 10.3 g/dL — AB (ref 13.0–18.0)
LYMPHS PCT: 20 %
Lymphs Abs: 1.2 10*3/uL (ref 1.0–3.6)
MCH: 32.6 pg (ref 26.0–34.0)
MCHC: 34.3 g/dL (ref 32.0–36.0)
MCV: 94.9 fL (ref 80.0–100.0)
Monocytes Absolute: 0.6 10*3/uL (ref 0.2–1.0)
Monocytes Relative: 11 %
NEUTROS ABS: 3.9 10*3/uL (ref 1.4–6.5)
NEUTROS PCT: 67 %
Platelets: 229 10*3/uL (ref 150–440)
RBC: 3.15 MIL/uL — AB (ref 4.40–5.90)
RDW: 17.1 % — ABNORMAL HIGH (ref 11.5–14.5)
WBC: 5.8 10*3/uL (ref 3.8–10.6)

## 2015-10-13 MED ORDER — PEGFILGRASTIM 6 MG/0.6ML ~~LOC~~ PSKT
6.0000 mg | PREFILLED_SYRINGE | Freq: Once | SUBCUTANEOUS | Status: AC
  Administered 2015-10-13: 6 mg via SUBCUTANEOUS
  Filled 2015-10-13: qty 0.6

## 2015-10-13 MED ORDER — FAMOTIDINE IN NACL 20-0.9 MG/50ML-% IV SOLN
20.0000 mg | Freq: Once | INTRAVENOUS | Status: AC
  Administered 2015-10-13: 20 mg via INTRAVENOUS
  Filled 2015-10-13: qty 50

## 2015-10-13 MED ORDER — SODIUM CHLORIDE 0.9% FLUSH
10.0000 mL | INTRAVENOUS | Status: DC | PRN
  Administered 2015-10-13: 10 mL via INTRAVENOUS
  Filled 2015-10-13: qty 10

## 2015-10-13 MED ORDER — PALONOSETRON HCL INJECTION 0.25 MG/5ML
0.2500 mg | Freq: Once | INTRAVENOUS | Status: AC
  Administered 2015-10-13: 0.25 mg via INTRAVENOUS
  Filled 2015-10-13: qty 5

## 2015-10-13 MED ORDER — PACLITAXEL CHEMO INJECTION 300 MG/50ML
200.0000 mg/m2 | Freq: Once | INTRAVENOUS | Status: AC
  Administered 2015-10-13: 438 mg via INTRAVENOUS
  Filled 2015-10-13: qty 73

## 2015-10-13 MED ORDER — SODIUM CHLORIDE 0.9 % IV SOLN
683.4000 mg | Freq: Once | INTRAVENOUS | Status: AC
  Administered 2015-10-13: 680 mg via INTRAVENOUS
  Filled 2015-10-13: qty 68

## 2015-10-13 MED ORDER — GABAPENTIN 100 MG PO CAPS
ORAL_CAPSULE | ORAL | Status: DC
Start: 2015-10-13 — End: 2015-11-08

## 2015-10-13 MED ORDER — HYDROCODONE-ACETAMINOPHEN 5-325 MG PO TABS: 1.0000 | ORAL_TABLET | Freq: Three times a day (TID) | ORAL | Status: DC | PRN

## 2015-10-13 MED ORDER — HEPARIN SOD (PORK) LOCK FLUSH 100 UNIT/ML IV SOLN
500.0000 [IU] | Freq: Once | INTRAVENOUS | Status: AC
  Administered 2015-10-13: 500 [IU] via INTRAVENOUS
  Filled 2015-10-13: qty 5

## 2015-10-13 MED ORDER — SODIUM CHLORIDE 0.9 % IV SOLN
20.0000 mg | Freq: Once | INTRAVENOUS | Status: AC
  Administered 2015-10-13: 20 mg via INTRAVENOUS
  Filled 2015-10-13: qty 2

## 2015-10-13 MED ORDER — DIPHENHYDRAMINE HCL 50 MG/ML IJ SOLN
50.0000 mg | Freq: Once | INTRAMUSCULAR | Status: AC
  Administered 2015-10-13: 50 mg via INTRAVENOUS
  Filled 2015-10-13: qty 1

## 2015-10-13 MED ORDER — SODIUM CHLORIDE 0.9 % IV SOLN
Freq: Once | INTRAVENOUS | Status: AC
  Administered 2015-10-13: 11:00:00 via INTRAVENOUS
  Filled 2015-10-13: qty 1000

## 2015-10-13 NOTE — Progress Notes (Signed)
[place] NOTE  Patient Care Team: [name], MD as PCP - General (Family Medicine)  CHIEF COMPLAINTS/PURPOSE OF CONSULTATION:   # OCT 2016- RUL POORLY DIFF CA [clinically non-small cell] ; STAGE IIB [T3-2 separate nodules in RUL; N0] [s/p CT guided bx]- s/p RT [finished mid jan 2017]; FEB 2017-CT scan- regression of RUL nodules; PDL-1 by IHC/keytruda- 11-20%.  # March 2017- Start carbo-taxol q 3 w x4  # OCT 2016- 2 sub cm nodules in Right LL- Feb CT 2017- STABLE  # OCT 2016-NON-invasive bladder urothelial cancer; high grade [Dr. [name]; s/p TURBT [Feb 2017- NEG]  # Pneumothorax [oct 2016 s/p CT Bx]   HISTORY OF PRESENTING ILLNESS:  Joseph Graham 75 y.o. male with a history of long-standing smoking/COPD-  Non-small cell/ poorly differentiated malignancy of the right upper lobe  T3 N0 status post radiation is here to proceed with adjuvant chemotherapy with carbo-taxol; is currently status post cycle #4 of carbotaxol.  Patient complains of worsening tingling and numbness in the hand and feet. He denies any falls. However the tingling and numbness is improving today. He has been a taking Neurontin 200 mg twice a day. He also has been needing to take hydrocodone 1 pill as needed for the pains.  He feels fatigued. "Short of breath"- only with exertion. None at rest. No cough or hemoptysis.  Abdominal discomfort improved since starting on Prilosec. Complains of weight gain.  ROS: A complete 10 point review of system is done which is negative for mentioned above in history of present illness.  MEDICAL HISTORY:  Past Medical History  Diagnosis Date  . Colon polyps     next colonoscopy due around 2018  . HTN (hypertension)   . Alcohol abuse, in remission     remote  . Benign essential tremor     improved on metoprolol and gabapentin  . Dyslipidemia     mild off meds  . Chronic low back pain     MRI 2004, multilevel DDD  . BPH (benign prostatic  hypertrophy) 12/30/2012  . Personal history of tobacco use, presenting hazards to health 02/18/2015    quit 06/2011, restarted 2014  . Multiple pulmonary nodules 02/2015  . COPD (chronic obstructive pulmonary disease) ([place])   . GERD (gastroesophageal reflux disease)   . Arthritis     BACK AND NECK  . Shortness of breath dyspnea     OCC WITH EXERTION  . Cardiac arrhythmia 03/2010    h/o a flutter and CM, s/p ablation, normal stress test  . Primary cancer of bladder ([place]) 2016    Budzyn  . Primary lung cancer ([place]) 2016  . Complication of anesthesia     DID NOT GET COMPLETELY NUMB WITH TONSILLECTOMY    SURGICAL HISTORY: Past Surgical History  Procedure Laterality Date  . Hemorroidectomy  1982  . A flutter ablation  03/2010  . Cataract extraction    . Carotid US  03/2010    WNL  . Colonoscopy  09/2011    polyps, rpt due 5 yrs, mild diverticulosis [name])  . Tonsillectomy  1964  . Electromagnetic navigation brochoscopy N/A 03/02/2015    Procedure: ELECTROMAGNETIC NAVIGATION BRONCHOSCOPY;  Surgeon: [name], MD;  Location: [facility] ORS;  Service: Cardiopulmonary;  Laterality: N/A;  . Transurethral resection of bladder tumor N/A 04/06/2015    Procedure: TRANSURETHRAL RESECTION OF BLADDER TUMOR (TURBT) right ureteral stent placement;  Surgeon: [name], MD;  Location: [facility] ORS;  Service: Urology;  Laterality:  N/A;  . Cystoscopy w/ retrogrades Bilateral 04/06/2015    Procedure: CYSTOSCOPY WITH RETROGRADE PYELOGRAM;  Surgeon: [name], MD;  Location: [facility] ORS;  Service: Urology;  Laterality: Bilateral;  . Urinary stent removal  04/14/15  . Transurethral resection of bladder tumor N/A 07/13/2015    Procedure: TRANSURETHRAL RESECTION OF BLADDER TUMOR (TURBT);  Surgeon: [name], MD;  Location: [facility] ORS;  Service: Urology;  Laterality: N/A;  . 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 Social History   Social History  . Marital Status: Married    Spouse Name: N/A  . Number of Children: N/A  . Years of Education: N/A   Occupational History  . Not on file.   Social History Main Topics  . Smoking status: Former Smoker -- 1.50 packs/day for 61 years    Types: Cigarettes    Quit date: 03/08/2015  . Smokeless tobacco: Never Used     Comment: cut back 0.5 cigarettes--stopped 03/07/15; now now chantix  . Alcohol Use: No     Comment: has not drank in 37 years  . Drug Use: No  . Sexual Activity: Not on file   Other Topics Concern  . Not on file   Social History Narrative   Caffeine: 1 cup decaf/day   Lives with wife   Occupation: retired, worked Press photographer   Activity: gym 3x/wk, Immunologist, hobbies (paint, building things)   Diet: healthy, good water, fruits/vegetables daily, red meat 1x/wk    FAMILY HISTORY: Family History  Problem Relation Age of Onset  . Cervical cancer Mother 52  . Hypertension Father   . Stroke Brother 29  . Tremor Father     and several uncles/aunts (not parkinson's)  . Bladder Cancer Brother 35  . Prostate cancer Neg Hx   . Kidney cancer Neg Hx     ALLERGIES:  has No Known Allergies.  MEDICATIONS:  Current Outpatient Prescriptions  Medication Sig Dispense Refill  . albuterol (PROVENTIL HFA;VENTOLIN HFA) 108 (90 Base) MCG/ACT inhaler Inhale 2 puffs into the lungs every 6 (six) hours as needed for wheezing or shortness of breath. 1 Inhaler 2  . famotidine (PEPCID) 10 MG tablet Take 10 mg by mouth 2 (two) times daily.     . fluticasone furoate-vilanterol (BREO ELLIPTA) 100-25 MCG/INH AEPB Inhale 1 puff into the lungs daily. (Patient taking differently: Inhale 1 puff into the lungs every morning. ) 60 each 5  . HYDROcodone-acetaminophen (NORCO) 5-325 MG tablet Take 1 tablet by mouth every 8 (eight) hours as needed for moderate  pain. 30 tablet 0  . lidocaine-prilocaine (EMLA) cream Apply 1 application topically as needed. To port a cath site 30 g 6  . lisinopril (PRINIVIL,ZESTRIL) 10 MG tablet Take 1 tablet (10 mg total) by mouth daily. (Patient taking differently: Take 10 mg by mouth every evening. ) 90 tablet 3  . loratadine (CLARITIN) 10 MG tablet Take 10 mg by mouth daily as needed for allergies.    . metoprolol tartrate (LOPRESSOR) 25 MG tablet Take 0.5 tablets (12.5 mg total) by mouth 2 (two) times daily. 90 tablet 3  . Multiple Vitamins-Minerals (MULTIVITAMIN WITH MINERALS) tablet Take 1 tablet by mouth daily.    . naproxen sodium (ALEVE) 220 MG tablet Take 220 mg by mouth 2 (two) times daily with a meal.  Reported on 08/10/2015    . umeclidinium bromide (INCRUSE ELLIPTA) 62.5 MCG/INH AEPB Inhale 1 puff into the lungs daily. (Patient taking differently: Inhale 1 puff into the lungs every morning. ) 30 each 5  . gabapentin (NEURONTIN) 100 MG capsule 3 pills every 12 hours 180 capsule 3  . ondansetron (ZOFRAN) 8 MG tablet Take 1 tablet (8 mg total) by mouth every 8 (eight) hours as needed for nausea or vomiting (start 3 days; after chemo). (Patient not taking: Reported on 10/13/2015) 40 tablet 0  . prochlorperazine (COMPAZINE) 10 MG tablet Take 1 tablet (10 mg total) by mouth every 6 (six) hours as needed for nausea or vomiting. (Patient not taking: Reported on 10/13/2015) 40 tablet 0   No current facility-administered medications for this visit.   Facility-Administered Medications Ordered in Other Visits  Medication Dose Route Frequency Provider Last Rate Last Dose  . heparin lock flush 100 unit/mL  500 Units Intravenous Once Cammie Sickle, MD      . sodium chloride flush (NS) 0.9 % injection 10 mL  10 mL Intravenous PRN Cammie Sickle, MD   10 mL at 10/13/15 0850  .  PHYSICAL EXAMINATION: ECOG PERFORMANCE STATUS: 0 - Asymptomatic  Filed Vitals:   10/13/15 0935  BP: 130/67  Pulse: 77  Temp: 97.8 F  (36.6 C)   Filed Weights   10/13/15 0935  Weight: 219 lb 4 oz (99.45 kg)  Pulse ox 94% on room air.  GENERAL:alert, no distress and comfortable. He is Accompanied by his wife. SKIN: skin color, texture, turgor are normal, no rashes or significant lesions EYES: normal, conjunctiva are pink and non-injected, sclera clear OROPHARYNX:no exudate, no erythema and lips, buccal mucosa, and tongue normal  NECK: supple, thyroid normal size, non-tender, without nodularity LYMPH:  no palpable lymphadenopathy in the cervical, axillary or inguinal LUNGS: clear to auscultation and percussion with normal breathing effort HEART: regular rate & rhythm and no murmurs and no lower extremity edema ABDOMEN:abdomen soft, non-tender and normal bowel sounds Musculoskeletal:no cyanosis of digits and no clubbing  PSYCH: alert & oriented x 3 with fluent speech NEURO: no focal motor/sensory deficits  LABORATORY DATA:  I have reviewed the data as listed Lab Results  Component Value Date   WBC 5.8 10/13/2015   HGB 10.3* 10/13/2015   HCT 29.9* 10/13/2015   MCV 94.9 10/13/2015   PLT 229 10/13/2015    Recent Labs  09/12/15 0935 09/22/15 0829 10/13/15 0840  NA 138 138 139  K 4.0 3.9 3.8  CL 105 106 105  CO2 '27 27 26  ' GLUCOSE 151* 167* 180*  BUN 22* 22* 19  CREATININE 0.83 0.76 0.76  CALCIUM 8.4* 8.6* 8.7*  GFRNONAA >60 >60 >60  GFRAA >60 >60 >60  PROT 7.1 7.4 7.6  ALBUMIN 3.8 3.7 3.8  AST '27 26 31  ' ALT 38 32 35  ALKPHOS 131* 113 121  BILITOT 0.5 0.5 0.5    RADIOGRAPHIC STUDIES:  ASSESSMENT & PLAN:   # Right UL POORLY DIFF CA- T3 N0  Currently status post radiation [finished feb 2017];  PR  On CT scan.  Currently on adjuvant chemotherapy carbotaxol 4; Status post cycle #3.He did well with the chemotherapy except for body aches muscle aches and joint pains; Mild-mod tingling and numbness.   # proceed with cycle #4 of carbotaxol today. CBC-mild anemia hemoglobin 10.5 otherwise CMP  normal.  # body aches muscle- joint pains- secondary to Neulasta. Improved. hydrocodone  # Tingling and numbness grade  1- fingertips and toes. From Taxol. Currently resolved. Increase the dose of Neurontin to 300 mg twice a day.  # . CBC BMP 10 days follow-up with me with CT scan in 5 weeks; CT scan in 4 weeks    Cammie Sickle, MD 10/13/2015 10:06 AM

## 2015-10-13 NOTE — Progress Notes (Signed)
Patient ambulates independently brought to exam room 4, accompanied by his wife.  Vitals documented, medication recorded updated, information provided by patient.

## 2015-10-16 DIAGNOSIS — J449 Chronic obstructive pulmonary disease, unspecified: Secondary | ICD-10-CM | POA: Diagnosis not present

## 2015-10-18 ENCOUNTER — Encounter: Payer: Self-pay | Admitting: Internal Medicine

## 2015-11-08 ENCOUNTER — Telehealth: Payer: Self-pay | Admitting: *Deleted

## 2015-11-08 DIAGNOSIS — G620 Drug-induced polyneuropathy: Secondary | ICD-10-CM

## 2015-11-08 DIAGNOSIS — T451X5A Adverse effect of antineoplastic and immunosuppressive drugs, initial encounter: Principal | ICD-10-CM

## 2015-11-08 DIAGNOSIS — C3491 Malignant neoplasm of unspecified part of right bronchus or lung: Secondary | ICD-10-CM

## 2015-11-08 MED ORDER — HYDROCODONE-ACETAMINOPHEN 5-325 MG PO TABS: 1.0000 | ORAL_TABLET | Freq: Three times a day (TID) | ORAL | Status: DC | PRN

## 2015-11-08 MED ORDER — GABAPENTIN 300 MG PO CAPS: 300.0000 mg | ORAL_CAPSULE | Freq: Three times a day (TID) | ORAL | Status: DC

## 2015-11-08 NOTE — Telephone Encounter (Signed)
Called to report that it has been 4 weeks since last x and he is having sever pain in his legs from knees down and it is difficult to walk. He also has numbness in his hands and his eyesight is not any better. He is out of Hydrocodone. He is asking that something be done for him

## 2015-11-08 NOTE — Telephone Encounter (Signed)
Patient advised of md orders. And will come in to pick up rx

## 2015-11-08 NOTE — Telephone Encounter (Signed)
Per Dr. Rogue Bussing. Ok to [employer] hydrocodone RX. Please have Magda Paganini, NP sign RX on behalf of Dr. Rogue Bussing since MD is in [place] clinic today.  Md will change the dose of the gabapentin from 100 mg 3 capsules twice daily to Gabapentin 300 mg 1 capsule three times a day.  This new RX was escribed to patient's pharmacy.

## 2015-11-10 ENCOUNTER — Ambulatory Visit
Admission: RE | Admit: 2015-11-10 | Discharge: 2015-11-10 | Disposition: A | Discharge status: Home / Self Care | Payer: Medicare Other | Source: Ambulatory Visit | Attending: Internal Medicine | Admitting: Internal Medicine

## 2015-11-10 DIAGNOSIS — R911 Solitary pulmonary nodule: Secondary | ICD-10-CM | POA: Insufficient documentation

## 2015-11-10 DIAGNOSIS — C3491 Malignant neoplasm of unspecified part of right bronchus or lung: Secondary | ICD-10-CM | POA: Insufficient documentation

## 2015-11-10 MED ORDER — IOPAMIDOL (ISOVUE-300) INJECTION 61%
75.0000 mL | Freq: Once | INTRAVENOUS | Status: AC | PRN
  Administered 2015-11-10: 75 mL via INTRAVENOUS

## 2015-11-16 DIAGNOSIS — J449 Chronic obstructive pulmonary disease, unspecified: Secondary | ICD-10-CM | POA: Diagnosis not present

## 2015-11-17 ENCOUNTER — Inpatient Hospital Stay: Payer: Medicare Other | Attending: Internal Medicine

## 2015-11-17 ENCOUNTER — Inpatient Hospital Stay (HOSPITAL_BASED_OUTPATIENT_CLINIC_OR_DEPARTMENT_OTHER): Payer: Medicare Other | Admitting: Internal Medicine

## 2015-11-17 VITALS — BP 150/71 | HR 87 | Temp 97.0°F | Resp 20 | Wt 219.6 lb

## 2015-11-17 DIAGNOSIS — E785 Hyperlipidemia, unspecified: Secondary | ICD-10-CM

## 2015-11-17 DIAGNOSIS — M545 Low back pain: Secondary | ICD-10-CM

## 2015-11-17 DIAGNOSIS — K219 Gastro-esophageal reflux disease without esophagitis: Secondary | ICD-10-CM | POA: Insufficient documentation

## 2015-11-17 DIAGNOSIS — G8929 Other chronic pain: Secondary | ICD-10-CM | POA: Insufficient documentation

## 2015-11-17 DIAGNOSIS — Z87891 Personal history of nicotine dependence: Secondary | ICD-10-CM

## 2015-11-17 DIAGNOSIS — N400 Enlarged prostate without lower urinary tract symptoms: Secondary | ICD-10-CM | POA: Diagnosis not present

## 2015-11-17 DIAGNOSIS — I10 Essential (primary) hypertension: Secondary | ICD-10-CM | POA: Insufficient documentation

## 2015-11-17 DIAGNOSIS — J449 Chronic obstructive pulmonary disease, unspecified: Secondary | ICD-10-CM | POA: Insufficient documentation

## 2015-11-17 DIAGNOSIS — G620 Drug-induced polyneuropathy: Secondary | ICD-10-CM

## 2015-11-17 DIAGNOSIS — T451X5S Adverse effect of antineoplastic and immunosuppressive drugs, sequela: Secondary | ICD-10-CM | POA: Insufficient documentation

## 2015-11-17 DIAGNOSIS — Z79899 Other long term (current) drug therapy: Secondary | ICD-10-CM | POA: Diagnosis not present

## 2015-11-17 DIAGNOSIS — Z923 Personal history of irradiation: Secondary | ICD-10-CM | POA: Diagnosis not present

## 2015-11-17 DIAGNOSIS — G250 Essential tremor: Secondary | ICD-10-CM | POA: Insufficient documentation

## 2015-11-17 DIAGNOSIS — Z8551 Personal history of malignant neoplasm of bladder: Secondary | ICD-10-CM | POA: Diagnosis not present

## 2015-11-17 DIAGNOSIS — J939 Pneumothorax, unspecified: Secondary | ICD-10-CM | POA: Diagnosis not present

## 2015-11-17 DIAGNOSIS — Z86010 Personal history of colonic polyps: Secondary | ICD-10-CM | POA: Diagnosis not present

## 2015-11-17 DIAGNOSIS — I499 Cardiac arrhythmia, unspecified: Secondary | ICD-10-CM | POA: Diagnosis not present

## 2015-11-17 DIAGNOSIS — G629 Polyneuropathy, unspecified: Secondary | ICD-10-CM | POA: Insufficient documentation

## 2015-11-17 DIAGNOSIS — C3490 Malignant neoplasm of unspecified part of unspecified bronchus or lung: Secondary | ICD-10-CM

## 2015-11-17 DIAGNOSIS — C3491 Malignant neoplasm of unspecified part of right bronchus or lung: Secondary | ICD-10-CM

## 2015-11-17 DIAGNOSIS — Z85118 Personal history of other malignant neoplasm of bronchus and lung: Secondary | ICD-10-CM

## 2015-11-17 DIAGNOSIS — R609 Edema, unspecified: Secondary | ICD-10-CM | POA: Insufficient documentation

## 2015-11-17 DIAGNOSIS — Z9221 Personal history of antineoplastic chemotherapy: Secondary | ICD-10-CM

## 2015-11-17 DIAGNOSIS — M1990 Unspecified osteoarthritis, unspecified site: Secondary | ICD-10-CM | POA: Insufficient documentation

## 2015-11-17 DIAGNOSIS — C3411 Malignant neoplasm of upper lobe, right bronchus or lung: Secondary | ICD-10-CM | POA: Insufficient documentation

## 2015-11-17 DIAGNOSIS — T451X5A Adverse effect of antineoplastic and immunosuppressive drugs, initial encounter: Secondary | ICD-10-CM

## 2015-11-17 LAB — BASIC METABOLIC PANEL
Anion gap: 9 (ref 5–15)
BUN: 24 mg/dL — AB (ref 6–20)
CHLORIDE: 102 mmol/L (ref 101–111)
CO2: 25 mmol/L (ref 22–32)
CREATININE: 0.88 mg/dL (ref 0.61–1.24)
Calcium: 8.6 mg/dL — ABNORMAL LOW (ref 8.9–10.3)
GFR calc Af Amer: >60 mL/min (ref >60)
GFR calc non Af Amer: >60 mL/min (ref >60)
GLUCOSE: 150 mg/dL — AB (ref 65–99)
Potassium: 3.8 mmol/L (ref 3.5–5.1)
Sodium: 136 mmol/L (ref 135–145)

## 2015-11-17 MED ORDER — HYDROCODONE-ACETAMINOPHEN 5-325 MG PO TABS: 1.0000 | ORAL_TABLET | Freq: Two times a day (BID) | ORAL | Status: DC | PRN

## 2015-11-17 NOTE — Progress Notes (Signed)
Pt here for CT results of his Lung cancer. Patient completed 4 chemotherapies and radiation therapy. Still has a lot of very uncomfortable neuropathy. Changes in vision. Appetite is okay. Hydrocodone does help keep him sane. Sleep is difficult d/t the neuropathy.

## 2015-11-17 NOTE — Progress Notes (Signed)
[facility] NOTE  Patient Care Team: [name], MD as PCP - General (Family Medicine)  CHIEF COMPLAINTS/PURPOSE OF CONSULTATION:   # OCT 2016- RUL POORLY DIFF CA [clinically non-small cell] ; STAGE IIB [T3-2 separate nodules in RUL; N0] [s/p CT guided bx]- s/p RT [finished mid jan 2017]; FEB 2017-CT scan- regression of RUL nodules; PDL-1 by IHC/keytruda- 11-20%.   # March 2017- Start carbo-taxol q 3 w x4; June 2017- CT- Improvement of RUL nodule/ Stable RUL nodules.   # OCT 2016- 2 sub cm nodules in Right LL- Feb CT 2017- STABLE; June 2017- resolved  # OCT 2016-NON-invasive bladder urothelial cancer; high grade [Dr. [name]; s/p TURBT [Feb 2017- NEG]  # Pneumothorax [oct 2016 s/p CT Bx]   HISTORY OF PRESENTING ILLNESS:  Joseph Graham 75 y.o. male with a history of long-standing smoking/COPD-  Non-small cell/ poorly differentiated malignancy of the right upper lobe  T3 N0 status post radiation; followed by adjuvant chemotherapy with [place] Taxol 4 cycles is here for follow-up/to review the results of the CT scan.  No new shortness of breath or chest pain or cough. He continues to complain of worsening tingling and numbness in the hand and feet. He denies any falls; however stumble times. Is currently on Neurontin 300 mg 2 times a day. He has been taking hydrocodone one pill as needed twice a day.    ROS: A complete 10 point review of system is done which is negative for mentioned above in history of present illness.  MEDICAL HISTORY:  Past Medical History  Diagnosis Date  . Colon polyps     next colonoscopy due around 2018  . HTN (hypertension)   . Alcohol abuse, in remission     remote  . Benign essential tremor     improved on metoprolol and gabapentin  . Dyslipidemia     mild off meds  . Chronic low back pain     MRI 2004, multilevel DDD  . BPH (benign prostatic hypertrophy) 12/30/2012  . Personal history of tobacco use, presenting hazards  to health 02/18/2015    quit 06/2011, restarted 2014  . Multiple pulmonary nodules 02/2015  . COPD (chronic obstructive pulmonary disease) ([place])   . GERD (gastroesophageal reflux disease)   . Arthritis     BACK AND NECK  . Shortness of breath dyspnea     OCC WITH EXERTION  . Cardiac arrhythmia 03/2010    h/o a flutter and CM, s/p ablation, normal stress test  . Complication of anesthesia     DID NOT GET COMPLETELY NUMB WITH TONSILLECTOMY  . Primary cancer of bladder ([place]) 2016    [place]  . Primary lung cancer ([place]) 2016    SURGICAL HISTORY: Past Surgical History  Procedure Laterality Date  . Hemorroidectomy  1982  . A flutter ablation  03/2010  . Cataract extraction    . Carotid US  03/2010    WNL  . Colonoscopy  09/2011    polyps, rpt due 5 yrs, mild diverticulosis [name])  . Tonsillectomy  1964  . Electromagnetic navigation brochoscopy N/A 03/02/2015    Procedure: ELECTROMAGNETIC NAVIGATION BRONCHOSCOPY;  Surgeon: [name], MD;  Location: [facility];  Service: Cardiopulmonary;  Laterality: N/A;  . Transurethral resection of bladder tumor N/A 04/06/2015    Procedure: TRANSURETHRAL RESECTION OF BLADDER TUMOR (TURBT) right ureteral stent placement;  Surgeon: [name], MD;  Location: [facility];  Service: Urology;  Laterality: N/A;  . Cystoscopy w/ retrogrades  Bilateral 04/06/2015    Procedure: CYSTOSCOPY WITH RETROGRADE PYELOGRAM;  Surgeon: [name], MD;  Location: [facility];  Service: Urology;  Laterality: Bilateral;  . Urinary stent removal  04/14/15  . Transurethral resection of bladder tumor N/A 07/13/2015    Procedure: TRANSURETHRAL RESECTION OF BLADDER TUMOR (TURBT);  Surgeon: [name], MD;  Location: [facility];  Service: Urology;  Laterality: N/A;  . Portacath placement N/A 08/10/2015    Procedure: INSERTION PORT-A-CATH;  Surgeon: [name], MD;  Location: [facility];  Service: General;  Laterality: N/A;    SOCIAL HISTORY: Patient is retired from  Press photographer. He lives in [place]. Social History   Social History  . Marital Status: Married    Spouse Name: N/A  . Number of Children: N/A  . Years of Education: N/A   Occupational History  . Not on file.   Social History Main Topics  . Smoking status: Former Smoker -- 1.50 packs/day for 61 years    Types: Cigarettes    Quit date: 03/08/2015  . Smokeless tobacco: Never Used     Comment: cut back 0.5 cigarettes--stopped 03/07/15; now now chantix  . Alcohol Use: No     Comment: has not drank in 37 years  . Drug Use: No  . Sexual Activity: Not on file   Other Topics Concern  . Not on file   Social History Narrative   Caffeine: 1 cup decaf/day   Lives with wife   Occupation: retired, worked Press photographer   Activity: gym 3x/wk, Immunologist, hobbies (paint, building things)   Diet: healthy, good water, fruits/vegetables daily, red meat 1x/wk    FAMILY HISTORY: Family History  Problem Relation Age of Onset  . Cervical cancer Mother 50  . Hypertension Father   . Stroke Brother 74  . Tremor Father     and several uncles/aunts (not parkinson's)  . Bladder Cancer Brother 71  . Prostate cancer Neg Hx   . Kidney cancer Neg Hx     ALLERGIES:  has No Known Allergies.  MEDICATIONS:  Current Outpatient Prescriptions  Medication Sig Dispense Refill  . albuterol (PROVENTIL HFA;VENTOLIN HFA) 108 (90 Base) MCG/ACT inhaler Inhale 2 puffs into the lungs every 6 (six) hours as needed for wheezing or shortness of breath. 1 Inhaler 2  . famotidine (PEPCID) 10 MG tablet Take 10 mg by mouth 2 (two) times daily.     . fluticasone furoate-vilanterol (BREO ELLIPTA) 100-25 MCG/INH AEPB Inhale 1 puff into the lungs daily. (Patient taking differently: Inhale 1 puff into the lungs every morning. ) 60 each 5  . gabapentin (NEURONTIN) 300 MG capsule Take 1 capsule (300 mg total) by mouth 3 (three) times daily. 84 capsule 3  . HYDROcodone-acetaminophen (NORCO) 5-325 MG tablet Take 1 tablet by mouth every 8  (eight) hours as needed for moderate pain. 30 tablet 0  . lidocaine-prilocaine (EMLA) cream Apply 1 application topically as needed. To port a cath site 30 g 6  . lisinopril (PRINIVIL,ZESTRIL) 10 MG tablet Take 1 tablet (10 mg total) by mouth daily. (Patient taking differently: Take 10 mg by mouth every evening. ) 90 tablet 3  . loratadine (CLARITIN) 10 MG tablet Take 10 mg by mouth daily as needed for allergies.    . metoprolol tartrate (LOPRESSOR) 25 MG tablet Take 0.5 tablets (12.5 mg total) by mouth 2 (two) times daily. 90 tablet 3  . Multiple Vitamins-Minerals (MULTIVITAMIN WITH MINERALS) tablet Take 1 tablet by mouth daily.    . naproxen sodium (  ALEVE) 220 MG tablet Take 220 mg by mouth 2 (two) times daily with a meal. Reported on 08/10/2015    . ondansetron (ZOFRAN) 8 MG tablet Take 1 tablet (8 mg total) by mouth every 8 (eight) hours as needed for nausea or vomiting (start 3 days; after chemo). 40 tablet 0  . prochlorperazine (COMPAZINE) 10 MG tablet Take 1 tablet (10 mg total) by mouth every 6 (six) hours as needed for nausea or vomiting. 40 tablet 0  . umeclidinium bromide (INCRUSE ELLIPTA) 62.5 MCG/INH AEPB Inhale 1 puff into the lungs daily. (Patient taking differently: Inhale 1 puff into the lungs every morning. ) 30 each 5   No current facility-administered medications for this visit.  Marland Kitchen  PHYSICAL EXAMINATION: ECOG PERFORMANCE STATUS: 0 - Asymptomatic  Filed Vitals:   11/17/15 0935  BP: 150/71  Pulse: 87  Temp: 97 F (36.1 C)  Resp: 20   Filed Weights   11/17/15 0935  Weight: 219 lb 9.3 oz (99.6 kg)  Pulse ox 94% on room air.  GENERAL:alert, no distress and comfortable. He is Accompanied by his wife. SKIN: skin color, texture, turgor are normal, no rashes or significant lesions EYES: normal, conjunctiva are pink and non-injected, sclera clear OROPHARYNX:no exudate, no erythema and lips, buccal mucosa, and tongue normal  NECK: supple, thyroid normal size, non-tender,  without nodularity LYMPH:  no palpable lymphadenopathy in the cervical, axillary or inguinal LUNGS: clear to auscultation and percussion with normal breathing effort HEART: regular rate & rhythm and no murmurs and no lower extremity edema ABDOMEN:abdomen soft, non-tender and normal bowel sounds Musculoskeletal:no cyanosis of digits and no clubbing  PSYCH: alert & oriented x 3 with fluent speech NEURO: no focal motor/sensory deficits  LABORATORY DATA:  I have reviewed the data as listed Lab Results  Component Value Date   WBC 5.8 10/13/2015   HGB 10.3* 10/13/2015   HCT 29.9* 10/13/2015   MCV 94.9 10/13/2015   PLT 229 10/13/2015    Recent Labs  09/12/15 0935 09/22/15 0829 10/13/15 0840 11/17/15 0910  NA 138 138 139 136  K 4.0 3.9 3.8 3.8  CL 105 106 105 102  CO2 '27 27 26 25  ' GLUCOSE 151* 167* 180* 150*  BUN 22* 22* 19 24*  CREATININE 0.83 0.76 0.76 0.88  CALCIUM 8.4* 8.6* 8.7* 8.6*  GFRNONAA >60 >60 >60 >60  GFRAA >60 >60 >60 >60  PROT 7.1 7.4 7.6  --   ALBUMIN 3.8 3.7 3.8  --   AST '27 26 31  ' --   ALT 38 32 35  --   ALKPHOS 131* 113 121  --   BILITOT 0.5 0.5 0.5  --     RADIOGRAPHIC STUDIES:  ASSESSMENT & PLAN:   # Right UL POORLY DIFF CA- T3 N0  Currently status post radiation [finished feb 2017];  Finished adjuvant chemotherapy [end of April 2017; June 2017- CT scan shows continued partial response. I reviewed the images myself and with the patient and family in detail. Copy of the report was given. Recommend follow-up CT scan in approximately 4 months. Patient has appointment with radiation oncology in couple of weeks.   # Tingling and numbness grade 2 - fingertips and toes. From Taxol. Currently resolved. Increase the dose of Neurontin to 300 mg TID. Also recommend hydrocodone one pill every 12 hours as needed. New prescription given.   # Follow-up with me in approximately 4 months. Labs CT scan prior/1 week.  # 25 minutes face-to-face with the patient  discussing the above plan of care; more than 50% of time spent on prognosis/ natural history; counseling and coordination.     Cammie Sickle, MD 11/17/2015 9:40 AM

## 2015-11-17 NOTE — Progress Notes (Signed)
#   Noninvasive bladder cancer ; high-grade-since patient has finished his adjuvant radiation;chemotherapy for his lung cancer. He could be reevaluated by urology for intravesical therapy. I have sent a message to his urologist. Dr.B

## 2015-11-25 ENCOUNTER — Other Ambulatory Visit: Payer: Medicare Other

## 2015-12-01 ENCOUNTER — Ambulatory Visit (INDEPENDENT_AMBULATORY_CARE_PROVIDER_SITE_OTHER): Payer: Medicare Other | Admitting: Urology

## 2015-12-01 VITALS — BP 127/67 | HR 84 | Ht 69.0 in | Wt 207.0 lb

## 2015-12-01 DIAGNOSIS — C679 Malignant neoplasm of bladder, unspecified: Secondary | ICD-10-CM

## 2015-12-01 LAB — URINALYSIS, COMPLETE
BILIRUBIN UA: NEGATIVE
GLUCOSE, UA: NEGATIVE
Ketones, UA: NEGATIVE
NITRITE UA: NEGATIVE
Protein, UA: NEGATIVE
SPEC GRAV UA: 1.025 (ref 1.005–1.030)
Urobilinogen, Ur: 0.2 mg/dL (ref 0.2–1.0)
pH, UA: 5.5 (ref 5.0–7.5)

## 2015-12-01 LAB — MICROSCOPIC EXAMINATION

## 2015-12-01 MED ORDER — LIDOCAINE HCL 2 % EX GEL
1.0000 "application " | Freq: Once | CUTANEOUS | Status: AC
  Administered 2015-12-01: 1 via URETHRAL

## 2015-12-01 MED ORDER — CIPROFLOXACIN HCL 500 MG PO TABS
500.0000 mg | ORAL_TABLET | Freq: Once | ORAL | Status: AC
  Administered 2015-12-01: 500 mg via ORAL

## 2015-12-01 NOTE — Progress Notes (Signed)
12/01/2015 10:33 AM   Joseph Graham Nov 30, 1940 [id number]  Referring provider: [name], MD [address] [place], [place] [address]  Chief Complaint  Patient presents with  . Cysto    HPI: The patient is a 75 year old gentleman with a past medical history of high-grade 3 cm Ta bladder cancer with no muscle specimen. This was originally diagnosed in October 2016. He underwent reresection in February 2017 which showed no evidence of disease. He has not undergone intravesical immunotherapy because he has been undergoing Taxol-based chemotherapy for a primary lung cancer. He finished his chemotherapy regimen 5 weeks ago. He is due for cystoscopy today.   PMH: Past Medical History  Diagnosis Date  . Colon polyps     next colonoscopy due around 2018  . HTN (hypertension)   . Alcohol abuse, in remission     remote  . Benign essential tremor     improved on metoprolol and gabapentin  . Dyslipidemia     mild off meds  . Chronic low back pain     MRI 2004, multilevel DDD  . BPH (benign prostatic hypertrophy) 12/30/2012  . Personal history of tobacco use, presenting hazards to health 02/18/2015    quit 06/2011, restarted 2014  . Multiple pulmonary nodules 02/2015  . COPD (chronic obstructive pulmonary disease) ([place])   . GERD (gastroesophageal reflux disease)   . Arthritis     BACK AND NECK  . Shortness of breath dyspnea     OCC WITH EXERTION  . Cardiac arrhythmia 03/2010    h/o a flutter and CM, s/p ablation, normal stress test  . Complication of anesthesia     DID NOT GET COMPLETELY NUMB WITH TONSILLECTOMY  . Primary cancer of bladder ([place]) 2016    Joseph Graham  . Primary lung cancer ([place]) 2016    Surgical History: Past Surgical History  Procedure Laterality Date  . Hemorroidectomy  1982  . A flutter ablation  03/2010  . Cataract extraction    . Carotid US  03/2010    WNL  . Colonoscopy  09/2011    polyps, rpt due 5 yrs, mild diverticulosis Joseph Graham)  .  Tonsillectomy  1964  . Electromagnetic navigation brochoscopy N/A 03/02/2015    Procedure: ELECTROMAGNETIC NAVIGATION BRONCHOSCOPY;  Surgeon: Joseph Boehringer, MD;  Location: [facility];  Service: Cardiopulmonary;  Laterality: N/A;  . Transurethral resection of bladder tumor N/A 04/06/2015    Procedure: TRANSURETHRAL RESECTION OF BLADDER TUMOR (TURBT) right ureteral stent placement;  Surgeon: Joseph Retort, MD;  Location: [facility];  Service: Urology;  Laterality: N/A;  . Cystoscopy w/ retrogrades Bilateral 04/06/2015    Procedure: CYSTOSCOPY WITH RETROGRADE PYELOGRAM;  Surgeon: Joseph Retort, MD;  Location: [facility];  Service: Urology;  Laterality: Bilateral;  . Urinary stent removal  04/14/15  . Transurethral resection of bladder tumor N/A 07/13/2015    Procedure: TRANSURETHRAL RESECTION OF BLADDER TUMOR (TURBT);  Surgeon: Joseph Retort, MD;  Location: [facility];  Service: Urology;  Laterality: N/A;  . Portacath placement N/A 08/10/2015    Procedure: INSERTION PORT-A-CATH;  Surgeon: Joseph Lewandowsky, MD;  Location: [facility];  Service: General;  Laterality: N/A;    Home Medications:    Medication List       This list is accurate as of: 12/01/15 10:33 AM.  Always use your most recent med list.               albuterol 108 (90 Base) MCG/ACT inhaler  Commonly known as:  PROVENTIL HFA;VENTOLIN HFA  Inhale 2 puffs into the lungs every 6 (six) hours as needed for wheezing or shortness of breath.     ALEVE 220 MG tablet  Generic drug:  naproxen sodium  Take 220 mg by mouth 2 (two) times daily with a meal. Reported on 08/10/2015     famotidine 10 MG tablet  Commonly known as:  PEPCID  Take 10 mg by mouth 2 (two) times daily.     fluticasone furoate-vilanterol 100-25 MCG/INH Aepb  Commonly known as:  BREO ELLIPTA  Inhale 1 puff into the lungs daily.     gabapentin 300 MG capsule  Commonly known as:  NEURONTIN  Take 1 capsule (300 mg total) by mouth 3 (three) times daily.      HYDROcodone-acetaminophen 5-325 MG tablet  Commonly known as:  NORCO  Take 1 tablet by mouth every 12 (twelve) hours as needed for moderate pain.     lidocaine-prilocaine cream  Commonly known as:  EMLA  Apply 1 application topically as needed. To port a cath site     lisinopril 10 MG tablet  Commonly known as:  PRINIVIL,ZESTRIL  Take 1 tablet (10 mg total) by mouth daily.     loratadine 10 MG tablet  Commonly known as:  CLARITIN  Take 10 mg by mouth daily as needed for allergies.     metoprolol tartrate 25 MG tablet  Commonly known as:  LOPRESSOR  Take 0.5 tablets (12.5 mg total) by mouth 2 (two) times daily.     multivitamin with minerals tablet  Take 1 tablet by mouth daily.     prochlorperazine 10 MG tablet  Commonly known as:  COMPAZINE  Take 1 tablet (10 mg total) by mouth every 6 (six) hours as needed for nausea or vomiting.     umeclidinium bromide 62.5 MCG/INH Aepb  Commonly known as:  INCRUSE ELLIPTA  Inhale 1 puff into the lungs daily.        Allergies: No Known Allergies  Family History: Family History  Problem Relation Age of Onset  . Cervical cancer Mother 22  . Hypertension Father   . Stroke Brother 63  . Tremor Father     and several uncles/aunts (not parkinson's)  . Bladder Cancer Brother 83  . Prostate cancer Neg Hx   . Kidney cancer Neg Hx     Social History:  reports that he quit smoking about 8 months ago. His smoking use included Cigarettes. He has a 91.5 pack-year smoking history. He has never used smokeless tobacco. He reports that he does not drink alcohol or use illicit drugs.  ROS:                                        Physical Exam: BP 127/67 mmHg  Pulse 84  Ht '5\' 9"'$  (1.753 m)  Wt 207 lb (93.895 kg)  BMI 30.55 kg/m2  Constitutional:  Alert and oriented, No acute distress. HEENT: Blue AT, moist mucus membranes.  Trachea midline, no masses. Cardiovascular: No clubbing, cyanosis, or edema. Respiratory:  Normal respiratory effort, no increased work of breathing. GI: Abdomen is soft, nontender, nondistended, no abdominal masses GU: No CVA tenderness.  Skin: No rashes, bruises or suspicious lesions. Lymph: No cervical or inguinal adenopathy. Neurologic: Grossly intact, no focal deficits, moving all 4 extremities. Psychiatric: Normal mood and affect.  Laboratory Data: Lab Results  Component Value Date  WBC 5.8 10/13/2015   HGB 10.3* 10/13/2015   HCT 29.9* 10/13/2015   MCV 94.9 10/13/2015   PLT 229 10/13/2015    Lab Results  Component Value Date   CREATININE 0.88 11/17/2015    Lab Results  Component Value Date   PSA 0.60 01/24/2015   PSA 0.68 12/31/2013   PSA 0.67 12/30/2012    No results found for: TESTOSTERONE  Lab Results  Component Value Date   HGBA1C 6.3 01/24/2015    Urinalysis    Component Value Date/Time   APPEARANCEUR Cloudy* 04/14/2015 0822   GLUCOSEU Negative 04/14/2015 0822   BILIRUBINUR Negative 04/14/2015 0822   PROTEINUR 2+* 04/14/2015 0822   NITRITE Negative 04/14/2015 0822   LEUKOCYTESUR 1+* 04/14/2015 0822     Cystoscopy Procedure Note  Patient identification was confirmed, informed consent was obtained, and patient was prepped using Betadine solution.  Lidocaine jelly was administered per urethral meatus.    Preoperative abx where received prior to procedure.     Pre-Procedure: - Inspection reveals a normal caliber ureteral meatus.  Procedure: The flexible cystoscope was introduced without difficulty - No urethral strictures/lesions are present. - Normal prostate  - Normal bladder neck - Bilateral ureteral orifices identified - Bladder mucosa  reveals no ulcers, tumors, or lesions - No bladder stones - No trabeculation   Post-Procedure: - Patient tolerated the procedure well     Assessment & Plan:   d a long discussion with the patient and his wife regarding BCG therapy. As he just finished chemotherapy, his immune system is  still recovering at this time.    1. High-grade pTa TCC of the bladder I had a long discussion with the patient and his wife regarding BCG therapy. As he just finished chemotherapy, his immune system is still recovering at this time.  His initial surgery was 9 months ago. We discussed that the chemotherapy likely had some affect on preventing recurrence of bladder cancer. I offered him BCG therapy at this time, though is not 100% clear if he needs it due to the long time since it's been since his initial surgery as well as having chemotherapy in the interim. At this time he has elected to undergo surveillance cystoscopy every 3 months instead. He will follow-up in 3 months for cystoscopy.   Return in about 3 months (around 03/02/2016) for cystoscopy.  Joseph Retort, MD  [facility] [address] [place], [place] [address] [phone]

## 2015-12-05 ENCOUNTER — Telehealth: Payer: Self-pay | Admitting: *Deleted

## 2015-12-05 ENCOUNTER — Inpatient Hospital Stay: Payer: Medicare Other

## 2015-12-05 ENCOUNTER — Inpatient Hospital Stay (HOSPITAL_BASED_OUTPATIENT_CLINIC_OR_DEPARTMENT_OTHER): Payer: Medicare Other | Admitting: Family Medicine

## 2015-12-05 VITALS — BP 145/67 | HR 76 | Temp 99.0°F | Resp 18 | Wt 219.6 lb

## 2015-12-05 DIAGNOSIS — J449 Chronic obstructive pulmonary disease, unspecified: Secondary | ICD-10-CM | POA: Diagnosis not present

## 2015-12-05 DIAGNOSIS — I10 Essential (primary) hypertension: Secondary | ICD-10-CM

## 2015-12-05 DIAGNOSIS — G250 Essential tremor: Secondary | ICD-10-CM | POA: Diagnosis not present

## 2015-12-05 DIAGNOSIS — Z79899 Other long term (current) drug therapy: Secondary | ICD-10-CM

## 2015-12-05 DIAGNOSIS — Z9221 Personal history of antineoplastic chemotherapy: Secondary | ICD-10-CM | POA: Diagnosis not present

## 2015-12-05 DIAGNOSIS — Z87891 Personal history of nicotine dependence: Secondary | ICD-10-CM | POA: Diagnosis not present

## 2015-12-05 DIAGNOSIS — G629 Polyneuropathy, unspecified: Secondary | ICD-10-CM

## 2015-12-05 DIAGNOSIS — E785 Hyperlipidemia, unspecified: Secondary | ICD-10-CM

## 2015-12-05 DIAGNOSIS — Z923 Personal history of irradiation: Secondary | ICD-10-CM | POA: Diagnosis not present

## 2015-12-05 DIAGNOSIS — C3490 Malignant neoplasm of unspecified part of unspecified bronchus or lung: Secondary | ICD-10-CM

## 2015-12-05 DIAGNOSIS — R609 Edema, unspecified: Secondary | ICD-10-CM | POA: Diagnosis not present

## 2015-12-05 DIAGNOSIS — C3411 Malignant neoplasm of upper lobe, right bronchus or lung: Secondary | ICD-10-CM | POA: Diagnosis not present

## 2015-12-05 DIAGNOSIS — M545 Low back pain: Secondary | ICD-10-CM

## 2015-12-05 DIAGNOSIS — N400 Enlarged prostate without lower urinary tract symptoms: Secondary | ICD-10-CM

## 2015-12-05 DIAGNOSIS — I499 Cardiac arrhythmia, unspecified: Secondary | ICD-10-CM | POA: Diagnosis not present

## 2015-12-05 DIAGNOSIS — T451X5S Adverse effect of antineoplastic and immunosuppressive drugs, sequela: Secondary | ICD-10-CM

## 2015-12-05 DIAGNOSIS — T451X5A Adverse effect of antineoplastic and immunosuppressive drugs, initial encounter: Secondary | ICD-10-CM

## 2015-12-05 DIAGNOSIS — M1990 Unspecified osteoarthritis, unspecified site: Secondary | ICD-10-CM | POA: Diagnosis not present

## 2015-12-05 DIAGNOSIS — G8929 Other chronic pain: Secondary | ICD-10-CM

## 2015-12-05 DIAGNOSIS — Z86010 Personal history of colonic polyps: Secondary | ICD-10-CM | POA: Diagnosis not present

## 2015-12-05 DIAGNOSIS — J939 Pneumothorax, unspecified: Secondary | ICD-10-CM | POA: Diagnosis not present

## 2015-12-05 DIAGNOSIS — C3491 Malignant neoplasm of unspecified part of right bronchus or lung: Secondary | ICD-10-CM

## 2015-12-05 DIAGNOSIS — Z8551 Personal history of malignant neoplasm of bladder: Secondary | ICD-10-CM | POA: Diagnosis not present

## 2015-12-05 DIAGNOSIS — K219 Gastro-esophageal reflux disease without esophagitis: Secondary | ICD-10-CM

## 2015-12-05 DIAGNOSIS — Z85118 Personal history of other malignant neoplasm of bronchus and lung: Secondary | ICD-10-CM

## 2015-12-05 DIAGNOSIS — G620 Drug-induced polyneuropathy: Secondary | ICD-10-CM

## 2015-12-05 LAB — COMPREHENSIVE METABOLIC PANEL
ALT: 52 U/L (ref 17–63)
ANION GAP: 6 (ref 5–15)
AST: 39 U/L (ref 15–41)
Albumin: 3.9 g/dL (ref 3.5–5.0)
Alkaline Phosphatase: 105 U/L (ref 38–126)
BILIRUBIN TOTAL: 0.4 mg/dL (ref 0.3–1.2)
BUN: 24 mg/dL — AB (ref 6–20)
CALCIUM: 8.8 mg/dL — AB (ref 8.9–10.3)
CHLORIDE: 104 mmol/L (ref 101–111)
CO2: 28 mmol/L (ref 22–32)
CREATININE: 0.87 mg/dL (ref 0.61–1.24)
GLUCOSE: 140 mg/dL — AB (ref 65–99)
POTASSIUM: 3.9 mmol/L (ref 3.5–5.1)
Sodium: 138 mmol/L (ref 135–145)
Total Protein: 7.7 g/dL (ref 6.5–8.1)

## 2015-12-05 LAB — CBC WITH DIFFERENTIAL/PLATELET
BASOS ABS: 0 10*3/uL (ref 0–0.1)
Basophils Relative: 1 %
EOS PCT: 2 %
Eosinophils Absolute: 0.1 10*3/uL (ref 0–0.7)
HEMATOCRIT: 34.9 % — AB (ref 40.0–52.0)
Hemoglobin: 12.1 g/dL — ABNORMAL LOW (ref 13.0–18.0)
LYMPHS PCT: 19 %
Lymphs Abs: 1.2 10*3/uL (ref 1.0–3.6)
MCH: 33.4 pg (ref 26.0–34.0)
MCHC: 34.6 g/dL (ref 32.0–36.0)
MCV: 96.5 fL (ref 80.0–100.0)
MONO ABS: 0.5 10*3/uL (ref 0.2–1.0)
MONOS PCT: 8 %
NEUTROS ABS: 4.4 10*3/uL (ref 1.4–6.5)
Neutrophils Relative %: 70 %
PLATELETS: 235 10*3/uL (ref 150–440)
RBC: 3.61 MIL/uL — ABNORMAL LOW (ref 4.40–5.90)
RDW: 15.8 % — AB (ref 11.5–14.5)
WBC: 6.2 10*3/uL (ref 3.8–10.6)

## 2015-12-05 MED ORDER — GABAPENTIN 300 MG PO CAPS: 600.0000 mg | ORAL_CAPSULE | Freq: Three times a day (TID) | ORAL | Status: DC

## 2015-12-05 MED ORDER — FUROSEMIDE 20 MG PO TABS: 20.0000 mg | ORAL_TABLET | Freq: Every day | ORAL | Status: DC

## 2015-12-05 MED ORDER — HYDROCODONE-ACETAMINOPHEN 5-325 MG PO TABS: 1.0000 | ORAL_TABLET | Freq: Two times a day (BID) | ORAL | Status: DC | PRN

## 2015-12-05 NOTE — Telephone Encounter (Signed)
Called to report that it has been 7 weeks since last infusion and his feet are still swollen and painful and he is getting aggravated with it. States he seems to be retaining fluid all over. Please advise

## 2015-12-05 NOTE — Telephone Encounter (Signed)
Per Dr B, come in for labs and see NP. Pt agrees to come in at 69 today

## 2015-12-05 NOTE — Progress Notes (Signed)
Joseph Graham  [phone]  Fax:([phone] [place] DOB: [birth year]  MR#: [id number]  LKG#:[id number]  Patient Care Team: [name], MD as PCP - General (Family Medicine)  CHIEF COMPLAINT:  Chief Complaint  Patient presents with  . Lung Cancer    INTERVAL HISTORY:  Patient is here as an acute add on regarding persistent numbness and tingling in the fingers and feet. Patient also reports having lower extremity edema for the last 2-3 weeks. Patient has been treated for non-small cell poorly differentiated right upper lobe lung cancer with adjuvant chemotherapy with carboplatin and Taxol 4 cycles.patient is currently taking gabapentin 300 mg 3 times per day and is still requiring hydrocodone 5/325 mg typically once per day. Patient reports having use cane for ambulation due to severe discomfort related to swelling as well as peripheral neuropathy. Patient otherwise feels very well and denies any acute complaints.  REVIEW OF SYSTEMS:   Review of Systems  Constitutional: Negative for fever, chills, weight loss, malaise/fatigue and diaphoresis.       Pain with ambulation from numbness and tingling in feet rated a 6 of 10 today  HENT: Negative.   Eyes: Negative.   Respiratory: Negative for cough, hemoptysis, sputum production, shortness of breath and wheezing.   Cardiovascular: Positive for leg swelling. Negative for chest pain, palpitations, orthopnea, claudication and PND.  Gastrointestinal: Negative for heartburn, nausea, vomiting, abdominal pain, diarrhea, constipation, blood in stool and melena.  Genitourinary: Negative.   Musculoskeletal: Negative.   Skin: Negative.   Neurological: Positive for tingling and focal weakness. Negative for dizziness, seizures and weakness.       Numbness in fingertips as well as feet.  Endo/Heme/Allergies: Does not bruise/bleed easily.  Psychiatric/Behavioral: Negative for depression. The patient is not  nervous/anxious and does not have insomnia.     As per HPI. Otherwise, a complete review of systems is negatve.  ONCOLOGY HISTORY:   Malignant neoplasm of lung ([place])   04/01/2015 Initial Diagnosis Malignant neoplasm of lung [facility])    PAST MEDICAL HISTORY: Past Medical History  Diagnosis Date  . Colon polyps     next colonoscopy due around 2018  . HTN (hypertension)   . Alcohol abuse, in remission     remote  . Benign essential tremor     improved on metoprolol and gabapentin  . Dyslipidemia     mild off meds  . Chronic low back pain     MRI 2004, multilevel DDD  . BPH (benign prostatic hypertrophy) 12/30/2012  . Personal history of tobacco use, presenting hazards to health 02/18/2015    quit 06/2011, restarted 2014  . Multiple pulmonary nodules 02/2015  . COPD (chronic obstructive pulmonary disease) ([place])   . GERD (gastroesophageal reflux disease)   . Arthritis     BACK AND NECK  . Shortness of breath dyspnea     OCC WITH EXERTION  . Cardiac arrhythmia 03/2010    h/o a flutter and CM, s/p ablation, normal stress test  . Complication of anesthesia     DID NOT GET COMPLETELY NUMB WITH TONSILLECTOMY  . Primary cancer of bladder ([place]) 2016    Budzyn  . Primary lung cancer ([place]) 2016    PAST SURGICAL HISTORY: Past Surgical History  Procedure Laterality Date  . Hemorroidectomy  1982  . A flutter ablation  03/2010  . Cataract extraction    . Carotid US  03/2010    WNL  . Colonoscopy  09/2011  polyps, rpt due 5 yrs, mild diverticulosis [name])  . Tonsillectomy  1964  . Electromagnetic navigation brochoscopy N/A 03/02/2015    Procedure: ELECTROMAGNETIC NAVIGATION BRONCHOSCOPY;  Surgeon: [name], MD;  Location: [facility] ORS;  Service: Cardiopulmonary;  Laterality: N/A;  . Transurethral resection of bladder tumor N/A 04/06/2015    Procedure: TRANSURETHRAL RESECTION OF BLADDER TUMOR (TURBT) right ureteral stent placement;  Surgeon: [name], MD;  Location: [facility]  ORS;  Service: Urology;  Laterality: N/A;  . Cystoscopy w/ retrogrades Bilateral 04/06/2015    Procedure: CYSTOSCOPY WITH RETROGRADE PYELOGRAM;  Surgeon: [name], MD;  Location: [facility] ORS;  Service: Urology;  Laterality: Bilateral;  . Urinary stent removal  04/14/15  . Transurethral resection of bladder tumor N/A 07/13/2015    Procedure: TRANSURETHRAL RESECTION OF BLADDER TUMOR (TURBT);  Surgeon: [name], MD;  Location: [facility] ORS;  Service: Urology;  Laterality: N/A;  . Portacath placement N/A 08/10/2015    Procedure: INSERTION PORT-A-CATH;  Surgeon: [name], MD;  Location: [facility] ORS;  Service: General;  Laterality: N/A;    FAMILY HISTORY Family History  Problem Relation Age of Onset  . Cervical cancer Mother 62  . Hypertension Father   . Stroke Brother 4  . Tremor Father     and several uncles/aunts (not parkinson's)  . Bladder Cancer Brother 22  . Prostate cancer Neg Hx   . Kidney cancer Neg Hx     GYNECOLOGIC HISTORY:  No LMP for male patient.     ADVANCED DIRECTIVES:    HEALTH MAINTENANCE: Social History  Substance Use Topics  . Smoking status: Former Smoker -- 1.50 packs/day for 61 years    Types: Cigarettes    Quit date: 03/08/2015  . Smokeless tobacco: Never Used     Comment: cut back 0.5 cigarettes--stopped 03/07/15; now now chantix  . Alcohol Use: No     Comment: has not drank in 37 years     No Known Allergies  Current Outpatient Prescriptions  Medication Sig Dispense Refill  . albuterol (PROVENTIL HFA;VENTOLIN HFA) 108 (90 Base) MCG/ACT inhaler Inhale 2 puffs into the lungs every 6 (six) hours as needed for wheezing or shortness of breath. 1 Inhaler 2  . famotidine (PEPCID) 10 MG tablet Take 10 mg by mouth 2 (two) times daily.     . fluticasone furoate-vilanterol (BREO ELLIPTA) 100-25 MCG/INH AEPB Inhale 1 puff into the lungs daily. (Patient taking differently: Inhale 1 puff into the lungs every morning. ) 60 each 5  .  HYDROcodone-acetaminophen (NORCO) 5-325 MG tablet Take 1 tablet by mouth every 12 (twelve) hours as needed for moderate pain. 60 tablet 0  . lidocaine-prilocaine (EMLA) cream Apply 1 application topically as needed. To port a cath site 30 g 6  . lisinopril (PRINIVIL,ZESTRIL) 10 MG tablet Take 1 tablet (10 mg total) by mouth daily. (Patient taking differently: Take 10 mg by mouth every evening. ) 90 tablet 3  . loratadine (CLARITIN) 10 MG tablet Take 10 mg by mouth daily as needed for allergies.    . metoprolol tartrate (LOPRESSOR) 25 MG tablet Take 0.5 tablets (12.5 mg total) by mouth 2 (two) times daily. 90 tablet 3  . Multiple Vitamins-Minerals (MULTIVITAMIN WITH MINERALS) tablet Take 1 tablet by mouth daily.    . naproxen sodium (ALEVE) 220 MG tablet Take 220 mg by mouth 2 (two) times daily with a meal. Reported on 08/10/2015    . prochlorperazine (COMPAZINE) 10 MG tablet Take 1 tablet (10 mg total) by mouth  every 6 (six) hours as needed for nausea or vomiting. 40 tablet 0  . umeclidinium bromide (INCRUSE ELLIPTA) 62.5 MCG/INH AEPB Inhale 1 puff into the lungs daily. (Patient taking differently: Inhale 1 puff into the lungs every morning. ) 30 each 5  . furosemide (LASIX) 20 MG tablet Take 1 tablet (20 mg total) by mouth daily. 10 tablet 0  . gabapentin (NEURONTIN) 300 MG capsule Take 2 capsules (600 mg total) by mouth 3 (three) times daily. 180 capsule 1   No current facility-administered medications for this visit.    OBJECTIVE: BP 145/67 mmHg  Pulse 76  Temp(Src) 99 F (37.2 C) (Tympanic)  Resp 18  Wt 219 lb 9.3 oz (99.6 kg)   Body mass index is 32.41 kg/(m^2).    ECOG FS:2 - Symptomatic, <50% confined to bed  General: Well-developed, well-nourished, no acute distress. Eyes: Pink conjunctiva, anicteric sclera. HEENT: Normocephalic, moist mucous membranes, clear oropharnyx. Musculoskeletal: 2-3 +pitting edema, cyanosis, or clubbing. Neuro: Alert, answering all questions appropriately.  Cranial nerves grossly intact. Grade 3 peripheral neuropathy in lower extremities, grade 2 in the fingertips and hands. Skin: No rashes or petechiae noted. Psych: Normal affect.   LAB RESULTS:  Appointment on 12/05/2015  Component Date Value Ref Range Status  . WBC 12/05/2015 6.2  3.8 - 10.6 K/uL Final  . RBC 12/05/2015 3.61* 4.40 - 5.90 MIL/uL Final  . Hemoglobin 12/05/2015 12.1* 13.0 - 18.0 g/dL Final  . HCT 12/05/2015 34.9* 40.0 - 52.0 % Final  . MCV 12/05/2015 96.5  80.0 - 100.0 fL Final  . MCH 12/05/2015 33.4  26.0 - 34.0 pg Final  . MCHC 12/05/2015 34.6  32.0 - 36.0 g/dL Final  . RDW 12/05/2015 15.8* 11.5 - 14.5 % Final  . Platelets 12/05/2015 235  150 - 440 K/uL Final  . Neutrophils Relative % 12/05/2015 70   Final  . Neutro Abs 12/05/2015 4.4  1.4 - 6.5 K/uL Final  . Lymphocytes Relative 12/05/2015 19   Final  . Lymphs Abs 12/05/2015 1.2  1.0 - 3.6 K/uL Final  . Monocytes Relative 12/05/2015 8   Final  . Monocytes Absolute 12/05/2015 0.5  0.2 - 1.0 K/uL Final  . Eosinophils Relative 12/05/2015 2   Final  . Eosinophils Absolute 12/05/2015 0.1  0 - 0.7 K/uL Final  . Basophils Relative 12/05/2015 1   Final  . Basophils Absolute 12/05/2015 0.0  0 - 0.1 K/uL Final  . Sodium 12/05/2015 138  135 - 145 mmol/L Final  . Potassium 12/05/2015 3.9  3.5 - 5.1 mmol/L Final  . Chloride 12/05/2015 104  101 - 111 mmol/L Final  . CO2 12/05/2015 28  22 - 32 mmol/L Final  . Glucose, Bld 12/05/2015 140* 65 - 99 mg/dL Final  . BUN 12/05/2015 24* 6 - 20 mg/dL Final  . Creatinine, Ser 12/05/2015 0.87  0.61 - 1.24 mg/dL Final  . Calcium 12/05/2015 8.8* 8.9 - 10.3 mg/dL Final  . Total Protein 12/05/2015 7.7  6.5 - 8.1 g/dL Final  . Albumin 12/05/2015 3.9  3.5 - 5.0 g/dL Final  . AST 12/05/2015 39  15 - 41 U/L Final  . ALT 12/05/2015 52  17 - 63 U/L Final  . Alkaline Phosphatase 12/05/2015 105  38 - 126 U/L Final  . Total Bilirubin 12/05/2015 0.4  0.3 - 1.2 mg/dL Final  . GFR calc non Af Amer  12/05/2015 >60  >60 mL/min Final  . GFR calc Af Amer 12/05/2015 >60  >60 mL/min Final  Comment: (NOTE) The eGFR has been calculated using the CKD EPI equation. This calculation has not been validated in all clinical situations. eGFR's persistently <60 mL/min signify possible Chronic Kidney Disease.   . Anion gap 12/05/2015 6  5 - 15 Final    STUDIES: No results found.  ASSESSMENT:  Right upper lobe poorly differentiated lung cancer, stage IIB,T3 N0 M0. Also with noninvasive bladder urothelial cancer, followed by Dr. [name]. Peripheral edema. Peripheral neuropathy.  PLAN:   1. Right upper lobe lung cancer. Patient is status post adjuvant chemotherapy with carboplatin and Taxol every 3 weeks 4 cycles. Most recent CT scan was performed in June 2017 and showed improvement of right upper lobe nodule as well as stable right upper lobe nodules. He has follow-up scheduled with Dr. Rogue Bussing in September and with Dr. Baruch Gouty at the end of the month. 2. Peripheral edema. Patient with 2-3+ pitting edema in the bilateral feet and ankles. Will start patient on furosemide 20 mg daily for the next 4 days. Patient advised to stop furosemide if he sees improvement in swelling. 3. Peripheral neuropathy. Patient with bilateral upper and lower extremity peripheral neuropathy, most likely from Taxol infusions. Patient reports that his most recent increase of gabapentin to 300 mg 3 times per day has not been effective in managing pain. We will increase gabapentin to 600 mg 3 times per day. Advised patient that he needs to stay in contact with [place] in order to monitor for improvement. Patient continues to use hydrocodone as needed for management of pain.  Patient asked to please contact our office by Thursday morning to update [place] on his edema as well as peripheral neuropathy.  Patient expressed understanding and was in agreement with this plan. He also understands that He can call clinic at any time with any  questions, concerns, or complaints.   Dr. Rogue Bussing was available for consultation and review of plan of care for this patient.  Evlyn Kanner, NP   12/05/2015 11:53 AM

## 2015-12-08 ENCOUNTER — Telehealth: Payer: Self-pay | Admitting: *Deleted

## 2015-12-08 ENCOUNTER — Other Ambulatory Visit: Payer: Self-pay | Admitting: Family Medicine

## 2015-12-08 NOTE — Telephone Encounter (Signed)
Patient

## 2015-12-08 NOTE — Telephone Encounter (Signed)
Patient states he is still having pain, however, it is slightly improved.  Inflammation is unchanged.  Water pill has not helped.

## 2015-12-09 ENCOUNTER — Inpatient Hospital Stay (HOSPITAL_BASED_OUTPATIENT_CLINIC_OR_DEPARTMENT_OTHER): Payer: Medicare Other | Admitting: Internal Medicine

## 2015-12-09 ENCOUNTER — Encounter: Payer: Self-pay | Admitting: Radiation Oncology

## 2015-12-09 ENCOUNTER — Ambulatory Visit
Admission: RE | Admit: 2015-12-09 | Discharge: 2015-12-09 | Disposition: A | Discharge status: Home / Self Care | Payer: Medicare Other | Source: Ambulatory Visit | Attending: Radiation Oncology | Admitting: Radiation Oncology

## 2015-12-09 VITALS — BP 153/69 | HR 80 | Temp 96.9°F | Resp 18 | Wt 221.6 lb

## 2015-12-09 VITALS — BP 147/61 | HR 87 | Temp 99.1°F | Wt 222.4 lb

## 2015-12-09 DIAGNOSIS — G8929 Other chronic pain: Secondary | ICD-10-CM | POA: Diagnosis not present

## 2015-12-09 DIAGNOSIS — Z9221 Personal history of antineoplastic chemotherapy: Secondary | ICD-10-CM | POA: Diagnosis not present

## 2015-12-09 DIAGNOSIS — Z85118 Personal history of other malignant neoplasm of bronchus and lung: Secondary | ICD-10-CM | POA: Diagnosis not present

## 2015-12-09 DIAGNOSIS — I10 Essential (primary) hypertension: Secondary | ICD-10-CM | POA: Diagnosis not present

## 2015-12-09 DIAGNOSIS — G250 Essential tremor: Secondary | ICD-10-CM

## 2015-12-09 DIAGNOSIS — Z923 Personal history of irradiation: Secondary | ICD-10-CM | POA: Diagnosis not present

## 2015-12-09 DIAGNOSIS — Z86010 Personal history of colonic polyps: Secondary | ICD-10-CM

## 2015-12-09 DIAGNOSIS — T451X5S Adverse effect of antineoplastic and immunosuppressive drugs, sequela: Secondary | ICD-10-CM

## 2015-12-09 DIAGNOSIS — Z87891 Personal history of nicotine dependence: Secondary | ICD-10-CM

## 2015-12-09 DIAGNOSIS — N400 Enlarged prostate without lower urinary tract symptoms: Secondary | ICD-10-CM | POA: Diagnosis not present

## 2015-12-09 DIAGNOSIS — C7931 Secondary malignant neoplasm of brain: Secondary | ICD-10-CM | POA: Diagnosis not present

## 2015-12-09 DIAGNOSIS — C3411 Malignant neoplasm of upper lobe, right bronchus or lung: Secondary | ICD-10-CM | POA: Insufficient documentation

## 2015-12-09 DIAGNOSIS — M545 Low back pain: Secondary | ICD-10-CM | POA: Diagnosis not present

## 2015-12-09 DIAGNOSIS — G629 Polyneuropathy, unspecified: Secondary | ICD-10-CM

## 2015-12-09 DIAGNOSIS — J449 Chronic obstructive pulmonary disease, unspecified: Secondary | ICD-10-CM

## 2015-12-09 DIAGNOSIS — E785 Hyperlipidemia, unspecified: Secondary | ICD-10-CM | POA: Diagnosis not present

## 2015-12-09 DIAGNOSIS — Z79899 Other long term (current) drug therapy: Secondary | ICD-10-CM | POA: Diagnosis not present

## 2015-12-09 DIAGNOSIS — M1990 Unspecified osteoarthritis, unspecified site: Secondary | ICD-10-CM | POA: Diagnosis not present

## 2015-12-09 DIAGNOSIS — C3491 Malignant neoplasm of unspecified part of right bronchus or lung: Secondary | ICD-10-CM

## 2015-12-09 DIAGNOSIS — R600 Localized edema: Secondary | ICD-10-CM | POA: Insufficient documentation

## 2015-12-09 DIAGNOSIS — Z8551 Personal history of malignant neoplasm of bladder: Secondary | ICD-10-CM

## 2015-12-09 DIAGNOSIS — R609 Edema, unspecified: Secondary | ICD-10-CM

## 2015-12-09 DIAGNOSIS — J939 Pneumothorax, unspecified: Secondary | ICD-10-CM

## 2015-12-09 DIAGNOSIS — C3490 Malignant neoplasm of unspecified part of unspecified bronchus or lung: Secondary | ICD-10-CM

## 2015-12-09 DIAGNOSIS — G620 Drug-induced polyneuropathy: Secondary | ICD-10-CM

## 2015-12-09 DIAGNOSIS — K219 Gastro-esophageal reflux disease without esophagitis: Secondary | ICD-10-CM | POA: Diagnosis not present

## 2015-12-09 DIAGNOSIS — I499 Cardiac arrhythmia, unspecified: Secondary | ICD-10-CM | POA: Diagnosis not present

## 2015-12-09 DIAGNOSIS — T451X5A Adverse effect of antineoplastic and immunosuppressive drugs, initial encounter: Secondary | ICD-10-CM

## 2015-12-09 MED ORDER — POTASSIUM CHLORIDE CRYS ER 20 MEQ PO TBCR: EXTENDED_RELEASE_TABLET | ORAL | Status: DC

## 2015-12-09 MED ORDER — FUROSEMIDE 40 MG PO TABS: 40.0000 mg | ORAL_TABLET | Freq: Two times a day (BID) | ORAL | Status: DC

## 2015-12-09 MED ORDER — HYDROCODONE-ACETAMINOPHEN 5-325 MG PO TABS: 1.0000 | ORAL_TABLET | Freq: Three times a day (TID) | ORAL | Status: DC | PRN

## 2015-12-09 NOTE — Assessment & Plan Note (Addendum)
#   Right UL POORLY DIFF CA- T3 N0  Currently status post radiation [finished feb 2017];  Finished adjuvant chemotherapy [end of April 2017; June 2017- CT scan shows continued partial response. Also reviewed radiation oncology recommendations. Plan CT scan in October 2017.  # Bil LE swelling/weight gain-likely from Neurontin.less likely DVT. Does not appear to be congestive heart failure. Start  lasix/ potassium; check weight daily; follow up in 2 weeks/ bmp; if not better- to call us. If not improving-we will get ultrasound of the legs;   # Tingling and numbness grade 2 - fingertips and toes. From Taxol.recommend Neurontin 300 twice a day. If not improving- referable to neurology; also discussed regarding use of Cymbalta. Also Norco one every 8 hours as needed; new prescription given  # follow-up in 2 weeks with a BMP.

## 2015-12-09 NOTE — Progress Notes (Signed)
Patient seen earlier in the week by Magda Paganini, NP for neuropathy and pain in his feet and legs.  Gabapentin was increased and lasix prescribed.  Patient states he is no better.  States he is having a hard time with buttons.  Also states his feet hurt.  Patient has edema in bilateral feet.  Cannot tell the lasix has helped at all.

## 2015-12-09 NOTE — Progress Notes (Signed)
[facility] NOTE  Patient Care Team: [name], MD as PCP - General (Family Medicine)  CHIEF COMPLAINTS/PURPOSE OF CONSULTATION:   Oncology History    # OCT 2016- RUL POORLY DIFF CA [clinically non-small cell] ; STAGE IIB [T3-2 separate nodules in RUL; N0] [s/p CT guided bx]- s/p RT [finished mid jan 2017]; FEB 2017-CT scan- regression of RUL nodules;   # March 2017- Start carbo-taxol q 3 w x4; June 2017- CT- Improvement of RUL nodule/ Stable RUL nodules.   # OCT 2016- 2 sub cm nodules in Right LL- Feb CT 2017- STABLE; June 2017- resolved  # May- 2017- G2 PN  # OCT 2016-NON-invasive bladder urothelial cancer; high grade [Dr. [name]; s/p TURBT [Feb 2017- NEG]; declined BCG  # Pneumothorax [oct 2016 s/p CT Bx];  # MOLECULAR STUDIES- PDL-1 by IHC/keytruda- 11-20%.        Malignant neoplasm of lung ([place])   04/01/2015 Initial Diagnosis Malignant neoplasm of lung ([facility])      HISTORY OF PRESENTING ILLNESS:  Joseph Graham 75 y.o. male with a history of long-standing smoking/COPD-  Non-small cell/ poorly differentiated malignancy of the right upper lobe  T3 N0 status post radiation; followed by adjuvant chemotherapy with [place] Taxol 4 cycles is here for follow-up.  Patient has been on Neurontin 1200 mg 3 times a day for the last 2 weeks; given the worsening neuropathy in his legs. However he has noted significant swelling in his legs when last 2 weeks. He denies any unusual shortness of breath or chest pain or cough. Patient had been started on Lasix 20 mg last week; no significant weight loss.  He continues to have tingling and numbness of his extremities; his chronic cramps in his legs not any worse not new.  ROS: A complete 10 point review of system is done which is negative for mentioned above in history of present illness.  MEDICAL HISTORY:  Past Medical History  Diagnosis Date  . Colon polyps     next colonoscopy due around 2018  . HTN  (hypertension)   . Alcohol abuse, in remission     remote  . Benign essential tremor     improved on metoprolol and gabapentin  . Dyslipidemia     mild off meds  . Chronic low back pain     MRI 2004, multilevel DDD  . BPH (benign prostatic hypertrophy) 12/30/2012  . Personal history of tobacco use, presenting hazards to health 02/18/2015    quit 06/2011, restarted 2014  . Multiple pulmonary nodules 02/2015  . COPD (chronic obstructive pulmonary disease) ([place])   . GERD (gastroesophageal reflux disease)   . Arthritis     BACK AND NECK  . Shortness of breath dyspnea     OCC WITH EXERTION  . Cardiac arrhythmia 03/2010    h/o a flutter and CM, s/p ablation, normal stress test  . Complication of anesthesia     DID NOT GET COMPLETELY NUMB WITH TONSILLECTOMY  . Primary cancer of bladder ([place]) 2016    Budzyn  . Primary lung cancer ([place]) 2016    SURGICAL HISTORY: Past Surgical History  Procedure Laterality Date  . Hemorroidectomy  1982  . A flutter ablation  03/2010  . Cataract extraction    . Carotid US  03/2010    WNL  . Colonoscopy  09/2011    polyps, rpt due 5 yrs, mild diverticulosis [name])  . Tonsillectomy  1964  . Electromagnetic navigation brochoscopy N/A 03/02/2015  Procedure: ELECTROMAGNETIC NAVIGATION BRONCHOSCOPY;  Surgeon: [name], MD;  Location: [facility] ORS;  Service: Cardiopulmonary;  Laterality: N/A;  . Transurethral resection of bladder tumor N/A 04/06/2015    Procedure: TRANSURETHRAL RESECTION OF BLADDER TUMOR (TURBT) right ureteral stent placement;  Surgeon: [name], MD;  Location: [facility] ORS;  Service: Urology;  Laterality: N/A;  . Cystoscopy w/ retrogrades Bilateral 04/06/2015    Procedure: CYSTOSCOPY WITH RETROGRADE PYELOGRAM;  Surgeon: [name], MD;  Location: [facility] ORS;  Service: Urology;  Laterality: Bilateral;  . Urinary stent removal  04/14/15  . Transurethral resection of bladder tumor N/A 07/13/2015    Procedure: TRANSURETHRAL  RESECTION OF BLADDER TUMOR (TURBT);  Surgeon: [name], MD;  Location: [facility] ORS;  Service: Urology;  Laterality: N/A;  . Portacath placement N/A 08/10/2015    Procedure: INSERTION PORT-A-CATH;  Surgeon: [name], MD;  Location: [facility] ORS;  Service: General;  Laterality: N/A;    SOCIAL HISTORY: Patient is retired from Press photographer. He lives in [place]. Social History   Social History  . Marital Status: Married    Spouse Name: N/A  . Number of Children: N/A  . Years of Education: N/A   Occupational History  . Not on file.   Social History Main Topics  . Smoking status: Former Smoker -- 1.50 packs/day for 61 years    Types: Cigarettes    Quit date: 03/08/2015  . Smokeless tobacco: Never Used     Comment: cut back 0.5 cigarettes--stopped 03/07/15; now now chantix  . Alcohol Use: No     Comment: has not drank in 37 years  . Drug Use: No  . Sexual Activity: Not on file   Other Topics Concern  . Not on file   Social History Narrative   Caffeine: 1 cup decaf/day   Lives with wife   Occupation: retired, worked Press photographer   Activity: gym 3x/wk, Immunologist, hobbies (paint, building things)   Diet: healthy, good water, fruits/vegetables daily, red meat 1x/wk    FAMILY HISTORY: Family History  Problem Relation Age of Onset  . Cervical cancer Mother 58  . Hypertension Father   . Stroke Brother 41  . Tremor Father     and several uncles/aunts (not parkinson's)  . Bladder Cancer Brother 64  . Prostate cancer Neg Hx   . Kidney cancer Neg Hx     ALLERGIES:  has No Known Allergies.  MEDICATIONS:  Current Outpatient Prescriptions  Medication Sig Dispense Refill  . albuterol (PROVENTIL HFA;VENTOLIN HFA) 108 (90 Base) MCG/ACT inhaler Inhale 2 puffs into the lungs every 6 (six) hours as needed for wheezing or shortness of breath. 1 Inhaler 2  . famotidine (PEPCID) 10 MG tablet Take 10 mg by mouth 2 (two) times daily.     . fluticasone furoate-vilanterol (BREO ELLIPTA) 100-25  MCG/INH AEPB Inhale 1 puff into the lungs daily. (Patient taking differently: Inhale 1 puff into the lungs every morning. ) 60 each 5  . furosemide (LASIX) 20 MG tablet Take 1 tablet (20 mg total) by mouth daily. 10 tablet 0  . gabapentin (NEURONTIN) 300 MG capsule Take 2 capsules (600 mg total) by mouth 3 (three) times daily. 180 capsule 1  . HYDROcodone-acetaminophen (NORCO) 5-325 MG tablet Take 1 tablet by mouth every 8 (eight) hours as needed for moderate pain. 60 tablet 0  . lidocaine-prilocaine (EMLA) cream Apply 1 application topically as needed. To port a cath site 30 g 6  . lisinopril (PRINIVIL,ZESTRIL) 10 MG tablet Take 1 tablet (  10 mg total) by mouth daily. (Patient taking differently: Take 10 mg by mouth every evening. ) 90 tablet 3  . loratadine (CLARITIN) 10 MG tablet Take 10 mg by mouth daily as needed for allergies.    . metoprolol tartrate (LOPRESSOR) 25 MG tablet Take 0.5 tablets (12.5 mg total) by mouth 2 (two) times daily. 90 tablet 3  . Multiple Vitamins-Minerals (MULTIVITAMIN WITH MINERALS) tablet Take 1 tablet by mouth daily.    . naproxen sodium (ALEVE) 220 MG tablet Take 220 mg by mouth 2 (two) times daily with a meal. Reported on 08/10/2015    . prochlorperazine (COMPAZINE) 10 MG tablet Take 1 tablet (10 mg total) by mouth every 6 (six) hours as needed for nausea or vomiting. 40 tablet 0  . umeclidinium bromide (INCRUSE ELLIPTA) 62.5 MCG/INH AEPB Inhale 1 puff into the lungs daily. (Patient taking differently: Inhale 1 puff into the lungs every morning. ) 30 each 5  . furosemide (LASIX) 40 MG tablet Take 1 tablet (40 mg total) by mouth 2 (two) times daily. X 3 days; and then once a day. 30 tablet 0  . potassium chloride SA (K-DUR,KLOR-CON) 20 MEQ tablet 3 pills once a day X 3 days; and then 2 pills once a day 40 tablet 0   No current facility-administered medications for this visit.  Marland Kitchen  PHYSICAL EXAMINATION: ECOG PERFORMANCE STATUS: 0 - Asymptomatic  Filed Vitals:    12/09/15 1150  BP: 153/69  Pulse: 80  Temp: 96.9 F (36.1 C)  Resp: 18   Filed Weights   12/09/15 1150  Weight: 221 lb 9 oz (100.5 kg)  Pulse ox 94% on room air.  GENERAL:alert, no distress and comfortable. He is Accompanied by his wife. SKIN: skin color, texture, turgor are normal, no rashes or significant lesions EYES: normal, conjunctiva are pink and non-injected, sclera clear OROPHARYNX:no exudate, no erythema and lips, buccal mucosa, and tongue normal  NECK: supple, thyroid normal size, non-tender, without nodularity LYMPH:  no palpable lymphadenopathy in the cervical, axillary or inguinal LUNGS: clear to auscultation and percussion with normal breathing effort HEART: regular rate & rhythm and no murmurs and bilateral 2+lower extremity edema ABDOMEN:abdomen soft, non-tender and normal bowel sounds Musculoskeletal:no cyanosis of digits and no clubbing  PSYCH: alert & oriented x 3 with fluent speech NEURO: no focal motor/sensory deficits  LABORATORY DATA:  I have reviewed the data as listed Lab Results  Component Value Date   WBC 6.2 12/05/2015   HGB 12.1* 12/05/2015   HCT 34.9* 12/05/2015   MCV 96.5 12/05/2015   PLT 235 12/05/2015    Recent Labs  09/22/15 0829 10/13/15 0840 11/17/15 0910 12/05/15 1050  NA 138 139 136 138  K 3.9 3.8 3.8 3.9  CL 106 105 102 104  CO2 _0 GLUCOSE 167* 180* 150* 140*  BUN 22* 19 24* 24*  CREATININE 0.76 0.76 0.88 0.87  CALCIUM 8.6* 8.7* 8.6* 8.8*  GFRNONAA >60 >60 >60 >60  GFRAA >60 >60 >60 >60  PROT 7.4 7.6  --  7.7  ALBUMIN 3.7 3.8  --  3.9  AST 26 31  --  39  ALT 32 35  --  52  ALKPHOS 113 121  --  105  BILITOT 0.5 0.5  --  0.4    RADIOGRAPHIC STUDIES:  ASSESSMENT & PLAN:   Malignant neoplasm of lung ([facility]) # Right UL POORLY DIFF CA- T3 N0  Currently status post radiation [finished feb 2017];  Finished adjuvant  chemotherapy [end of April 2017; June 2017- CT scan shows continued partial response. Also reviewed  radiation oncology recommendations. Plan CT scan in October 2017.  # Bil LE swelling/weight gain-likely from Neurontin.less likely DVT. Does not appear to be congestive heart failure. Start  lasix/ potassium; check weight daily; follow up in 2 weeks/ bmp; if not better- to call us. If not improving-we will get ultrasound of the legs;   # Tingling and numbness grade 2 - fingertips and toes. From Taxol.recommend Neurontin 300 twice a day. If not improving- referable to neurology; also discussed regarding use of Cymbalta. Also Norco one every 8 hours as needed; new prescription given  # follow-up in 2 weeks with a BMP.       Cammie Sickle, MD 12/09/2015 6:15 PM

## 2015-12-09 NOTE — Progress Notes (Signed)
Radiation Oncology Follow up Note  Name: Joseph Graham   Date:   12/09/2015 MRN:  [id number] DOB: September 29, 1940    This 75 y.o. male presents to the clinic today for 5 month follow-up for right upper lobe poorly differentia non-small cell lung cancer status post radiation therapy.  REFERRING PROVIDER: [name], MD  HPI: Patient is a 75 year old male now out 5 months having completed radiation therapy to right upper lobe for 2 separate nodules. He is seen today in routine follow-up and is doing fairly well his major problems are with peripheral edema and peripheral neuropathy he is currently on large dose gabapentin. He specifically denies cough hemoptysis or chest tightness.. He had a CT scan back in June 2017 showing the right upper lobe nodule is decreased in size right apical nodule unchanged and small 6 no meter nodules inferior right upper lobes slightly larger although this may be dependent on slice thickness. He has completed his chemotherapy and as above is dealing with peripheral neuropathy.  COMPLICATIONS OF TREATMENT: none  FOLLOW UP COMPLIANCE: keeps appointments   PHYSICAL EXAM:  BP 147/61 mmHg  Pulse 87  Temp(Src) 99.1 F (37.3 C)  Wt 222 lb 7.1 oz (100.9 kg) Patient has 2+ peripheral edema in his lower extremities. No cervical or supraclavicular adenopathy is identified. Well-developed well-nourished patient in NAD. HEENT reveals PERLA, EOMI, discs not visualized.  Oral cavity is clear. No oral mucosal lesions are identified. Neck is clear without evidence of cervical or supraclavicular adenopathy. Lungs are clear to A&P. Cardiac examination is essentially unremarkable with regular rate and rhythm without murmur rub or thrill. Abdomen is benign with no organomegaly or masses noted. Motor sensory and DTR levels are equal and symmetric in the upper and lower extremities. Cranial nerves II through XII are grossly intact. Proprioception is intact. No peripheral adenopathy or  edema is identified. No motor or sensory levels are noted. Crude visual fields are within normal range.  RADIOLOGY RESULTS: CT scan reviewed and compatible with the above-stated findings  PLAN: Present time patient from our standpoint is stable. I've asked to see him back in 4 months after completion of his next scan. He continues close follow-up care with medical oncology. He will be addressing his peripheral neuropathy and lower extremity edema with medical oncology. Patient is to call sooner with any concerns.  I would like to take this opportunity to thank you for allowing me to participate in the care of your patient.Armstead Peaks., MD

## 2015-12-16 DIAGNOSIS — J449 Chronic obstructive pulmonary disease, unspecified: Secondary | ICD-10-CM | POA: Diagnosis not present

## 2015-12-20 ENCOUNTER — Encounter: Payer: Self-pay | Admitting: Internal Medicine

## 2015-12-20 DIAGNOSIS — C3411 Malignant neoplasm of upper lobe, right bronchus or lung: Secondary | ICD-10-CM | POA: Insufficient documentation

## 2015-12-23 ENCOUNTER — Inpatient Hospital Stay: Payer: Medicare Other | Attending: Internal Medicine | Admitting: Internal Medicine

## 2015-12-23 ENCOUNTER — Inpatient Hospital Stay: Payer: Medicare Other

## 2015-12-23 VITALS — BP 132/67 | HR 83 | Temp 98.2°F | Resp 18 | Wt 220.9 lb

## 2015-12-23 DIAGNOSIS — G250 Essential tremor: Secondary | ICD-10-CM | POA: Insufficient documentation

## 2015-12-23 DIAGNOSIS — R200 Anesthesia of skin: Secondary | ICD-10-CM | POA: Insufficient documentation

## 2015-12-23 DIAGNOSIS — I499 Cardiac arrhythmia, unspecified: Secondary | ICD-10-CM

## 2015-12-23 DIAGNOSIS — Z86010 Personal history of colonic polyps: Secondary | ICD-10-CM | POA: Insufficient documentation

## 2015-12-23 DIAGNOSIS — R600 Localized edema: Secondary | ICD-10-CM

## 2015-12-23 DIAGNOSIS — Z452 Encounter for adjustment and management of vascular access device: Secondary | ICD-10-CM | POA: Insufficient documentation

## 2015-12-23 DIAGNOSIS — R202 Paresthesia of skin: Secondary | ICD-10-CM | POA: Diagnosis not present

## 2015-12-23 DIAGNOSIS — R2243 Localized swelling, mass and lump, lower limb, bilateral: Secondary | ICD-10-CM

## 2015-12-23 DIAGNOSIS — D701 Agranulocytosis secondary to cancer chemotherapy: Secondary | ICD-10-CM

## 2015-12-23 DIAGNOSIS — T451X5A Adverse effect of antineoplastic and immunosuppressive drugs, initial encounter: Secondary | ICD-10-CM

## 2015-12-23 DIAGNOSIS — C3411 Malignant neoplasm of upper lobe, right bronchus or lung: Secondary | ICD-10-CM | POA: Diagnosis not present

## 2015-12-23 DIAGNOSIS — M1990 Unspecified osteoarthritis, unspecified site: Secondary | ICD-10-CM | POA: Diagnosis not present

## 2015-12-23 DIAGNOSIS — Z79899 Other long term (current) drug therapy: Secondary | ICD-10-CM | POA: Insufficient documentation

## 2015-12-23 DIAGNOSIS — M545 Low back pain: Secondary | ICD-10-CM | POA: Diagnosis not present

## 2015-12-23 DIAGNOSIS — Z923 Personal history of irradiation: Secondary | ICD-10-CM | POA: Insufficient documentation

## 2015-12-23 DIAGNOSIS — C3491 Malignant neoplasm of unspecified part of right bronchus or lung: Secondary | ICD-10-CM

## 2015-12-23 DIAGNOSIS — G620 Drug-induced polyneuropathy: Secondary | ICD-10-CM

## 2015-12-23 DIAGNOSIS — M7989 Other specified soft tissue disorders: Secondary | ICD-10-CM | POA: Diagnosis not present

## 2015-12-23 DIAGNOSIS — N400 Enlarged prostate without lower urinary tract symptoms: Secondary | ICD-10-CM | POA: Diagnosis not present

## 2015-12-23 DIAGNOSIS — I10 Essential (primary) hypertension: Secondary | ICD-10-CM

## 2015-12-23 DIAGNOSIS — E785 Hyperlipidemia, unspecified: Secondary | ICD-10-CM | POA: Diagnosis not present

## 2015-12-23 DIAGNOSIS — G8929 Other chronic pain: Secondary | ICD-10-CM | POA: Diagnosis not present

## 2015-12-23 DIAGNOSIS — J449 Chronic obstructive pulmonary disease, unspecified: Secondary | ICD-10-CM | POA: Insufficient documentation

## 2015-12-23 DIAGNOSIS — K219 Gastro-esophageal reflux disease without esophagitis: Secondary | ICD-10-CM | POA: Diagnosis not present

## 2015-12-23 DIAGNOSIS — Z8551 Personal history of malignant neoplasm of bladder: Secondary | ICD-10-CM | POA: Insufficient documentation

## 2015-12-23 HISTORY — DX: Drug-induced polyneuropathy: G62.0

## 2015-12-23 HISTORY — DX: Drug-induced polyneuropathy: T45.1X5A

## 2015-12-23 LAB — CBC WITH DIFFERENTIAL/PLATELET
BASOS ABS: 0 10*3/uL (ref 0–0.1)
BASOS PCT: 1 %
EOS PCT: 2 %
Eosinophils Absolute: 0.1 10*3/uL (ref 0–0.7)
HCT: 34.8 % — ABNORMAL LOW (ref 40.0–52.0)
Hemoglobin: 12.2 g/dL — ABNORMAL LOW (ref 13.0–18.0)
LYMPHS ABS: 1.1 10*3/uL (ref 1.0–3.6)
LYMPHS PCT: 20 %
MCH: 33.4 pg (ref 26.0–34.0)
MCHC: 35 g/dL (ref 32.0–36.0)
MCV: 95.4 fL (ref 80.0–100.0)
Monocytes Absolute: 0.5 10*3/uL (ref 0.2–1.0)
Monocytes Relative: 9 %
NEUTROS ABS: 3.9 10*3/uL (ref 1.4–6.5)
Neutrophils Relative %: 68 %
PLATELETS: 247 10*3/uL (ref 150–440)
RBC: 3.64 MIL/uL — AB (ref 4.40–5.90)
RDW: 14.2 % (ref 11.5–14.5)
WBC: 5.8 10*3/uL (ref 3.8–10.6)

## 2015-12-23 LAB — BASIC METABOLIC PANEL
ANION GAP: 7 (ref 5–15)
BUN: 33 mg/dL — ABNORMAL HIGH (ref 6–20)
CALCIUM: 8.8 mg/dL — AB (ref 8.9–10.3)
CO2: 26 mmol/L (ref 22–32)
Chloride: 103 mmol/L (ref 101–111)
Creatinine, Ser: 1.09 mg/dL (ref 0.61–1.24)
Glucose, Bld: 156 mg/dL — ABNORMAL HIGH (ref 65–99)
POTASSIUM: 4.3 mmol/L (ref 3.5–5.1)
SODIUM: 136 mmol/L (ref 135–145)

## 2015-12-23 MED ORDER — FUROSEMIDE 40 MG PO TABS: 40.0000 mg | ORAL_TABLET | Freq: Every day | ORAL | Status: DC

## 2015-12-23 MED ORDER — POTASSIUM CHLORIDE CRYS ER 20 MEQ PO TBCR: 20.0000 meq | EXTENDED_RELEASE_TABLET | Freq: Two times a day (BID) | ORAL | Status: DC

## 2015-12-23 NOTE — Assessment & Plan Note (Addendum)
#   Right UL POORLY DIFF CA- T3 N0  Currently status post radiation [finished feb 2017];  Finished adjuvant chemotherapy [end of April 2017; June 2017- CT scan shows continued partial response. Plan CT scan in October 2017.  # Bil LE swelling/weight gain-likely from Neurontin; decrease dose of neurontin to 100 BID [hx of tremors]. Check venous dopplers/ 2 d echo. Cont lasix 40day/ K 40/day.   # Tingling and numbness grade 2 - fingertips and toes. From Taxol.recommend. hydrocodone every 8 hours as needed. Leg elevation.   # follow-up in 2 weeks with a BMP.

## 2015-12-23 NOTE — Progress Notes (Signed)
[facility] NOTE  Patient Care Team: [name], MD as PCP - General (Family Medicine)  CHIEF COMPLAINTS/PURPOSE OF CONSULTATION:   Oncology History    # OCT 2016- RUL POORLY DIFF CA [clinically non-small cell] ; STAGE IIB [T3-2 separate nodules in RUL; N0] [s/p CT guided bx]- s/p RT [finished mid jan 2017]; FEB 2017-CT scan- regression of RUL nodules;   # March 2017- Start carbo-taxol q 3 w x4; June 2017- CT- Improvement of RUL nodule/ Stable RUL nodules.   # OCT 2016- 2 sub cm nodules in Right LL- Feb CT 2017- STABLE; June 2017- resolved  # May- 2017- G2 PN  # OCT 2016-NON-invasive bladder urothelial cancer; high grade [Dr. [name]; s/p TURBT [Feb 2017- NEG]; declined BCG  # Pneumothorax [oct 2016 s/p CT Bx];  # MOLECULAR STUDIES- PDL-1 by IHC/keytruda- 11-20%.        Malignant neoplasm of lung (Joseph Graham)   04/01/2015 Initial Diagnosis Malignant neoplasm of lung (HCC)    Malignant neoplasm of right upper lobe of lung (Joseph Graham)   12/20/2015 Initial Diagnosis Malignant neoplasm of right upper lobe of lung (HCC)      HISTORY OF PRESENTING ILLNESS:  Joseph Graham 75 y.o. male with a history of long-standing smoking/COPD-  Non-small cell/ poorly differentiated malignancy of the right upper lobe  T3 N0 status post radiation; followed by adjuvant chemotherapy with [place] Taxol 4 cycles; Is here for follow-up of his bilateral extremity.  His current on Lasix 40 mg once a day along with potassium. The swelling is improved but not resolved. Continues to complain of tingling and numbness of his extremities. Denies any shortness of breath or chest pain. The leg swelling improves with elevation.   ROS: A complete 10 point review of system is done which is negative for mentioned above in history of present illness.  MEDICAL HISTORY:  Past Medical History  Diagnosis Date  . Colon polyps     next colonoscopy due around 2018  . HTN (hypertension)   . Alcohol  abuse, in remission     remote  . Benign essential tremor     improved on metoprolol and gabapentin  . Dyslipidemia     mild off meds  . Chronic low back pain     MRI 2004, multilevel DDD  . BPH (benign prostatic hypertrophy) 12/30/2012  . Personal history of tobacco use, presenting hazards to health 02/18/2015    quit 06/2011, restarted 2014  . Multiple pulmonary nodules 02/2015  . COPD (chronic obstructive pulmonary disease) ([place])   . GERD (gastroesophageal reflux disease)   . Arthritis     BACK AND NECK  . Shortness of breath dyspnea     OCC WITH EXERTION  . Cardiac arrhythmia 03/2010    h/o a flutter and CM, s/p ablation, normal stress test  . Complication of anesthesia     DID NOT GET COMPLETELY NUMB WITH TONSILLECTOMY  . Primary cancer of bladder (Joseph Graham) 2016    Budzyn  . Primary lung cancer (Joseph Graham) 2016    SURGICAL HISTORY: Past Surgical History  Procedure Laterality Date  . Hemorroidectomy  1982  . A flutter ablation  03/2010  . Cataract extraction    . Carotid US  03/2010    WNL  . Colonoscopy  09/2011    polyps, rpt due 5 yrs, mild diverticulosis [name])  . Tonsillectomy  1964  . Electromagnetic navigation brochoscopy N/A 03/02/2015    Procedure: ELECTROMAGNETIC NAVIGATION BRONCHOSCOPY;  Surgeon: [name], MD;  Location: [facility];  Service: Cardiopulmonary;  Laterality: N/A;  . Transurethral resection of bladder tumor N/A 04/06/2015    Procedure: TRANSURETHRAL RESECTION OF BLADDER TUMOR (TURBT) right ureteral stent placement;  Surgeon: [name], MD;  Location: [facility];  Service: Urology;  Laterality: N/A;  . Cystoscopy w/ retrogrades Bilateral 04/06/2015    Procedure: CYSTOSCOPY WITH RETROGRADE PYELOGRAM;  Surgeon: [name], MD;  Location: [facility];  Service: Urology;  Laterality: Bilateral;  . Urinary stent removal  04/14/15  . Transurethral resection of bladder tumor N/A 07/13/2015    Procedure: TRANSURETHRAL RESECTION OF BLADDER TUMOR (TURBT);   Surgeon: [name], MD;  Location: [facility];  Service: Urology;  Laterality: N/A;  . Portacath placement N/A 08/10/2015    Procedure: INSERTION PORT-A-CATH;  Surgeon: [name], MD;  Location: [facility];  Service: General;  Laterality: N/A;    SOCIAL HISTORY: Patient is retired from Press [employer]. He lives in [place]. Social History   Social History  . Marital Status: Married    Spouse Name: N/A  . Number of Children: N/A  . Years of Education: N/A   Occupational History  . Not on file.   Social History Main Topics  . Smoking status: Former Smoker -- 1.50 packs/day for 61 years    Types: Cigarettes    Quit date: 03/08/2015  . Smokeless tobacco: Never Used     Comment: cut back 0.5 cigarettes--stopped 03/07/15; now now chantix  . Alcohol Use: No     Comment: has not drank in 37 years  . Drug Use: No  . Sexual Activity: Not on file   Other Topics Concern  . Not on file   Social History Narrative   Caffeine: 1 cup decaf/day   Lives with wife   Occupation: retired, worked Press photographer   Activity: gym 3x/wk, Immunologist, hobbies (paint, building things)   Diet: healthy, good water, fruits/vegetables daily, red meat 1x/wk    FAMILY HISTORY: Family History  Problem Relation Age of Onset  . Cervical cancer Mother 24  . Hypertension Father   . Stroke Brother 74  . Tremor Father     and several uncles/aunts (not parkinson's)  . Bladder Cancer Brother 29  . Prostate cancer Neg Hx   . Kidney cancer Neg Hx     ALLERGIES:  has No Known Allergies.  MEDICATIONS:  Current Outpatient Prescriptions  Medication Sig Dispense Refill  . albuterol (PROVENTIL HFA;VENTOLIN HFA) 108 (90 Base) MCG/ACT inhaler Inhale 2 puffs into the lungs every 6 (six) hours as needed for wheezing or shortness of breath. 1 Inhaler 2  . famotidine (PEPCID) 10 MG tablet Take 10 mg by mouth 2 (two) times daily.     . fluticasone furoate-vilanterol (BREO ELLIPTA) 100-25 MCG/INH AEPB Inhale 1 puff into the  lungs daily. (Patient taking differently: Inhale 1 puff into the lungs every morning. ) 60 each 5  . furosemide (LASIX) 40 MG tablet Take 1 tablet (40 mg total) by mouth daily. 30 tablet 2  . gabapentin (NEURONTIN) 300 MG capsule Take 2 capsules (600 mg total) by mouth 3 (three) times daily. 180 capsule 1  . HYDROcodone-acetaminophen (NORCO) 5-325 MG tablet Take 1 tablet by mouth every 8 (eight) hours as needed for moderate pain. 60 tablet 0  . lidocaine-prilocaine (EMLA) cream Apply 1 application topically as needed. To port a cath site 30 g 6  . lisinopril (PRINIVIL,ZESTRIL) 10 MG tablet Take 1 tablet (10 mg total) by mouth daily. (Patient taking differently: Take  10 mg by mouth every evening. ) 90 tablet 3  . loratadine (CLARITIN) 10 MG tablet Take 10 mg by mouth daily as needed for allergies.    . metoprolol tartrate (LOPRESSOR) 25 MG tablet Take 0.5 tablets (12.5 mg total) by mouth 2 (two) times daily. 90 tablet 3  . Multiple Vitamins-Minerals (MULTIVITAMIN WITH MINERALS) tablet Take 1 tablet by mouth daily.    . naproxen sodium (ALEVE) 220 MG tablet Take 220 mg by mouth 2 (two) times daily with a meal. Reported on 08/10/2015    . potassium chloride SA (K-DUR,KLOR-CON) 20 MEQ tablet Take 1 tablet (20 mEq total) by mouth 2 (two) times daily. X 3 weeks 40 tablet 2  . prochlorperazine (COMPAZINE) 10 MG tablet Take 1 tablet (10 mg total) by mouth every 6 (six) hours as needed for nausea or vomiting. 40 tablet 0  . umeclidinium bromide (INCRUSE ELLIPTA) 62.5 MCG/INH AEPB Inhale 1 puff into the lungs daily. (Patient taking differently: Inhale 1 puff into the lungs every morning. ) 30 each 5   No current facility-administered medications for this visit.  Marland Kitchen  PHYSICAL EXAMINATION: ECOG PERFORMANCE STATUS: 0 - Asymptomatic  Filed Vitals:   12/23/15 1022  BP: 132/67  Pulse: 83  Temp: 98.2 F (36.8 C)  Resp: 18   Filed Weights   12/23/15 1022  Weight: 220 lb 14.4 oz (100.2 kg)  Pulse ox 94% on  room air.  GENERAL:alert, no distress and comfortable. He is Accompanied by his wife. SKIN: skin color, texture, turgor are normal, no rashes or significant lesions EYES: normal, conjunctiva are pink and non-injected, sclera clear OROPHARYNX:no exudate, no erythema and lips, buccal mucosa, and tongue normal  NECK: supple, thyroid normal size, non-tender, without nodularity LYMPH:  no palpable lymphadenopathy in the cervical, axillary or inguinal LUNGS: clear to auscultation and percussion with normal breathing effort HEART: regular rate & rhythm and no murmurs and bilateral 2+lower extremity edema ABDOMEN:abdomen soft, non-tender and normal bowel sounds Musculoskeletal:no cyanosis of digits and no clubbing  PSYCH: alert & oriented x 3 with fluent speech NEURO: no focal motor/sensory deficits  LABORATORY DATA:  I have reviewed the data as listed Lab Results  Component Value Date   WBC 5.8 12/23/2015   HGB 12.2* 12/23/2015   HCT 34.8* 12/23/2015   MCV 95.4 12/23/2015   PLT 247 12/23/2015    Recent Labs  09/22/15 0829 10/13/15 0840 11/17/15 0910 12/05/15 1050 12/23/15 1006  NA 138 139 136 138 136  K 3.9 3.8 3.8 3.9 4.3  CL 106 105 102 104 103  CO2 _0 GLUCOSE 167* 180* 150* 140* 156*  BUN 22* 19 24* 24* 33*  CREATININE 0.76 0.76 0.88 0.87 1.09  CALCIUM 8.6* 8.7* 8.6* 8.8* 8.8*  GFRNONAA >60 >60 >60 >60 >60  GFRAA >60 >60 >60 >60 >60  PROT 7.4 7.6  --  7.7  --   ALBUMIN 3.7 3.8  --  3.9  --   AST 26 31  --  39  --   ALT 32 35  --  52  --   ALKPHOS 113 121  --  105  --   BILITOT 0.5 0.5  --  0.4  --     RADIOGRAPHIC STUDIES:  ASSESSMENT & PLAN:   Malignant neoplasm of right upper lobe of lung (HCC) # Right UL POORLY DIFF CA- T3 N0  Currently status post radiation [finished feb 2017];  Finished adjuvant chemotherapy [end of April 2017; June  2017- CT scan shows continued partial response. Plan CT scan in October 2017.  # Bil LE swelling/weight  gain-likely from Neurontin; decrease dose of neurontin to 100 BID [hx of tremors]. Check venous dopplers/ 2 d echo. Cont lasix 40day/ K 40/day.   # Tingling and numbness grade 2 - fingertips and toes. From Taxol.recommend. hydrocodone every 8 hours as needed. Leg elevation.   # follow-up in 2 weeks with a BMP.       Cammie Sickle, MD 12/23/2015 6:12 PM

## 2015-12-23 NOTE — Progress Notes (Signed)
Patient states he continues to have bilateral lower extremity edema and pain.  Worse on right side.

## 2015-12-27 ENCOUNTER — Telehealth: Payer: Self-pay | Admitting: Internal Medicine

## 2015-12-27 ENCOUNTER — Ambulatory Visit
Admission: RE | Admit: 2015-12-27 | Discharge: 2015-12-27 | Disposition: A | Discharge status: Home / Self Care | Payer: Medicare Other | Source: Ambulatory Visit | Attending: Internal Medicine | Admitting: Internal Medicine

## 2015-12-27 DIAGNOSIS — I10 Essential (primary) hypertension: Secondary | ICD-10-CM | POA: Diagnosis not present

## 2015-12-27 DIAGNOSIS — R600 Localized edema: Secondary | ICD-10-CM | POA: Diagnosis not present

## 2015-12-27 DIAGNOSIS — C3411 Malignant neoplasm of upper lobe, right bronchus or lung: Secondary | ICD-10-CM | POA: Insufficient documentation

## 2015-12-27 NOTE — Telephone Encounter (Signed)
Patient says he really needs handicap placard for his car as soon as possible. He said he can barely walk and he really hopes he won't have to wait until his next MD visit to get it, if it is possible to get it sooner. Please advise. Thanks.

## 2015-12-27 NOTE — Telephone Encounter (Signed)
Contacted patient. Informed him that this Handicap application completed and ready for pick up. However, MD is in [place] clinic today. Pt declined coming to [facility] today for pick up and will pick up application on 0/16/58 at 830am.

## 2015-12-28 ENCOUNTER — Ambulatory Visit
Admission: RE | Admit: 2015-12-28 | Discharge: 2015-12-28 | Disposition: A | Discharge status: Home / Self Care | Payer: Medicare Other | Source: Ambulatory Visit | Attending: Internal Medicine | Admitting: Internal Medicine

## 2015-12-28 ENCOUNTER — Telehealth: Payer: Self-pay | Admitting: *Deleted

## 2015-12-28 DIAGNOSIS — I110 Hypertensive heart disease with heart failure: Secondary | ICD-10-CM | POA: Diagnosis not present

## 2015-12-28 DIAGNOSIS — E785 Hyperlipidemia, unspecified: Secondary | ICD-10-CM | POA: Diagnosis not present

## 2015-12-28 DIAGNOSIS — J449 Chronic obstructive pulmonary disease, unspecified: Secondary | ICD-10-CM | POA: Diagnosis not present

## 2015-12-28 DIAGNOSIS — C3411 Malignant neoplasm of upper lobe, right bronchus or lung: Secondary | ICD-10-CM | POA: Diagnosis not present

## 2015-12-28 DIAGNOSIS — R600 Localized edema: Secondary | ICD-10-CM | POA: Diagnosis not present

## 2015-12-28 DIAGNOSIS — I10 Essential (primary) hypertension: Secondary | ICD-10-CM

## 2015-12-28 DIAGNOSIS — Z85118 Personal history of other malignant neoplasm of bronchus and lung: Secondary | ICD-10-CM | POA: Diagnosis not present

## 2015-12-28 DIAGNOSIS — K219 Gastro-esophageal reflux disease without esophagitis: Secondary | ICD-10-CM | POA: Insufficient documentation

## 2015-12-28 DIAGNOSIS — I5031 Acute diastolic (congestive) heart failure: Secondary | ICD-10-CM | POA: Insufficient documentation

## 2015-12-28 NOTE — Telephone Encounter (Signed)
Called patient to inform him dopplers were negative.  Awaiting further workup results.

## 2015-12-28 NOTE — Telephone Encounter (Signed)
-----   Message from Cammie Sickle, MD sent at 12/27/2015  5:07 PM EDT ----- Please inform pt re: Dopplers negative. Await further work up- Thx.

## 2015-12-28 NOTE — Progress Notes (Signed)
*  PRELIMINARY RESULTS* Echocardiogram 2D Echocardiogram has been performed.  Joseph Graham 12/28/2015, 10:19 AM

## 2016-01-04 ENCOUNTER — Inpatient Hospital Stay: Payer: Medicare Other

## 2016-01-04 ENCOUNTER — Other Ambulatory Visit: Payer: Self-pay | Admitting: *Deleted

## 2016-01-04 ENCOUNTER — Inpatient Hospital Stay (HOSPITAL_BASED_OUTPATIENT_CLINIC_OR_DEPARTMENT_OTHER): Payer: Medicare Other | Admitting: Internal Medicine

## 2016-01-04 VITALS — BP 128/73 | HR 85 | Temp 98.4°F | Resp 18 | Wt 220.0 lb

## 2016-01-04 DIAGNOSIS — C3411 Malignant neoplasm of upper lobe, right bronchus or lung: Secondary | ICD-10-CM

## 2016-01-04 DIAGNOSIS — Z452 Encounter for adjustment and management of vascular access device: Secondary | ICD-10-CM

## 2016-01-04 DIAGNOSIS — K219 Gastro-esophageal reflux disease without esophagitis: Secondary | ICD-10-CM

## 2016-01-04 DIAGNOSIS — Z79899 Other long term (current) drug therapy: Secondary | ICD-10-CM

## 2016-01-04 DIAGNOSIS — J449 Chronic obstructive pulmonary disease, unspecified: Secondary | ICD-10-CM | POA: Diagnosis not present

## 2016-01-04 DIAGNOSIS — M545 Low back pain: Secondary | ICD-10-CM

## 2016-01-04 DIAGNOSIS — R600 Localized edema: Secondary | ICD-10-CM

## 2016-01-04 DIAGNOSIS — E785 Hyperlipidemia, unspecified: Secondary | ICD-10-CM

## 2016-01-04 DIAGNOSIS — Z923 Personal history of irradiation: Secondary | ICD-10-CM

## 2016-01-04 DIAGNOSIS — I10 Essential (primary) hypertension: Secondary | ICD-10-CM

## 2016-01-04 DIAGNOSIS — M7989 Other specified soft tissue disorders: Secondary | ICD-10-CM

## 2016-01-04 DIAGNOSIS — Z86010 Personal history of colonic polyps: Secondary | ICD-10-CM | POA: Diagnosis not present

## 2016-01-04 DIAGNOSIS — G250 Essential tremor: Secondary | ICD-10-CM

## 2016-01-04 DIAGNOSIS — Z8551 Personal history of malignant neoplasm of bladder: Secondary | ICD-10-CM | POA: Diagnosis not present

## 2016-01-04 DIAGNOSIS — R200 Anesthesia of skin: Secondary | ICD-10-CM | POA: Diagnosis not present

## 2016-01-04 DIAGNOSIS — N400 Enlarged prostate without lower urinary tract symptoms: Secondary | ICD-10-CM

## 2016-01-04 DIAGNOSIS — R202 Paresthesia of skin: Secondary | ICD-10-CM

## 2016-01-04 DIAGNOSIS — M1990 Unspecified osteoarthritis, unspecified site: Secondary | ICD-10-CM | POA: Diagnosis not present

## 2016-01-04 DIAGNOSIS — Z95828 Presence of other vascular implants and grafts: Secondary | ICD-10-CM

## 2016-01-04 DIAGNOSIS — C3490 Malignant neoplasm of unspecified part of unspecified bronchus or lung: Secondary | ICD-10-CM

## 2016-01-04 DIAGNOSIS — I499 Cardiac arrhythmia, unspecified: Secondary | ICD-10-CM | POA: Diagnosis not present

## 2016-01-04 DIAGNOSIS — G8929 Other chronic pain: Secondary | ICD-10-CM

## 2016-01-04 LAB — CBC WITH DIFFERENTIAL/PLATELET
Basophils Absolute: 0 10*3/uL (ref 0–0.1)
Basophils Relative: 1 %
EOS PCT: 2 %
Eosinophils Absolute: 0.1 10*3/uL (ref 0–0.7)
HEMATOCRIT: 35.9 % — AB (ref 40.0–52.0)
HEMOGLOBIN: 12.2 g/dL — AB (ref 13.0–18.0)
Lymphocytes Relative: 17 %
Lymphs Abs: 1.2 10*3/uL (ref 1.0–3.6)
MCH: 32.3 pg (ref 26.0–34.0)
MCHC: 33.9 g/dL (ref 32.0–36.0)
MCV: 95.1 fL (ref 80.0–100.0)
MONO ABS: 0.7 10*3/uL (ref 0.2–1.0)
MONOS PCT: 10 %
NEUTROS ABS: 4.8 10*3/uL (ref 1.4–6.5)
Neutrophils Relative %: 70 %
Platelets: 244 10*3/uL (ref 150–440)
RBC: 3.78 MIL/uL — ABNORMAL LOW (ref 4.40–5.90)
RDW: 14.2 % (ref 11.5–14.5)
WBC: 6.8 10*3/uL (ref 3.8–10.6)

## 2016-01-04 LAB — BASIC METABOLIC PANEL
Anion gap: 7 (ref 5–15)
BUN: 17 mg/dL (ref 6–20)
CHLORIDE: 103 mmol/L (ref 101–111)
CO2: 26 mmol/L (ref 22–32)
CREATININE: 0.88 mg/dL (ref 0.61–1.24)
Calcium: 8.7 mg/dL — ABNORMAL LOW (ref 8.9–10.3)
GFR calc non Af Amer: >60 mL/min (ref >60)
GLUCOSE: 110 mg/dL — AB (ref 65–99)
Potassium: 3.9 mmol/L (ref 3.5–5.1)
Sodium: 136 mmol/L (ref 135–145)

## 2016-01-04 MED ORDER — HEPARIN SOD (PORK) LOCK FLUSH 100 UNIT/ML IV SOLN
500.0000 [IU] | Freq: Once | INTRAVENOUS | Status: AC
  Administered 2016-01-04: 500 [IU] via INTRAVENOUS

## 2016-01-04 MED ORDER — FUROSEMIDE 40 MG PO TABS: 40.0000 mg | ORAL_TABLET | Freq: Every day | ORAL | 2 refills | Status: DC

## 2016-01-04 MED ORDER — SODIUM CHLORIDE 0.9% FLUSH
10.0000 mL | INTRAVENOUS | Status: DC | PRN
  Administered 2016-01-04: 10 mL via INTRAVENOUS
  Filled 2016-01-04: qty 10

## 2016-01-04 NOTE — Progress Notes (Signed)
[place] NOTE  Patient Care Team: [name], MD as PCP - General (Family Medicine)  CHIEF COMPLAINTS/PURPOSE OF CONSULTATION:   Oncology History    # OCT 2016- RUL POORLY DIFF CA [clinically non-small cell] ; STAGE IIB [T3-2 separate nodules in RUL; N0] [s/p CT guided bx]- s/p RT [finished mid jan 2017]; FEB 2017-CT scan- regression of RUL nodules;   # March 2017- Start carbo-taxol q 3 w x4; June 2017- CT- Improvement of RUL nodule/ Stable RUL nodules.   # OCT 2016- 2 sub cm nodules in Right LL- Feb CT 2017- STABLE; June 2017- resolved  # May- 2017- G2 PN  # OCT 2016-NON-invasive bladder urothelial cancer; high grade [Dr. [name]; s/p TURBT [Feb 2017- NEG]; declined BCG  # Pneumothorax [oct 2016 s/p CT Bx];  # MOLECULAR STUDIES- PDL-1 by IHC/keytruda- 11-20%.        Malignant neoplasm of lung ([place])   04/01/2015 Initial Diagnosis    Malignant neoplasm of lung ([facility])      Malignant neoplasm of right upper lobe of lung ([place])   12/20/2015 Initial Diagnosis    Malignant neoplasm of right upper lobe of lung (HCC)        HISTORY OF PRESENTING ILLNESS:  Joseph Graham 75 y.o. male with a history of long-standing smoking/COPD-  Non-small cell/ poorly differentiated malignancy of the right upper lobe  T3 N0 status post radiation; followed by adjuvant chemotherapy with [place] Taxol 4 cycles; Is here for follow-up of his bilateral extremity/ review the results of the ultrasound of the legs and a 2-D echo  Patient was recommended to take Lasix 40 mg a day at last visit approximately 2 weeks ago. Patient states that he never got the prescription; never started it.  Patient continues to have swelling in the legs; his weight is steady.  Continues to complain of tingling and numbness of his extremities. Denies any shortness of breath or chest pain.   ROS: A complete 10 point review of system is done which is negative for mentioned above in history of  present illness.  MEDICAL HISTORY:  Past Medical History:  Diagnosis Date  . Alcohol abuse, in remission    remote  . Arthritis    BACK AND NECK  . Benign essential tremor    improved on metoprolol and gabapentin  . BPH (benign prostatic hypertrophy) 12/30/2012  . Cardiac arrhythmia 03/2010   h/o a flutter and CM, s/p ablation, normal stress test  . Chronic low back pain    MRI 2004, multilevel DDD  . Colon polyps    next colonoscopy due around 2018  . Complication of anesthesia    DID NOT GET COMPLETELY NUMB WITH TONSILLECTOMY  . COPD (chronic obstructive pulmonary disease) ([place])   . Dyslipidemia    mild off meds  . GERD (gastroesophageal reflux disease)   . HTN (hypertension)   . Multiple pulmonary nodules 02/2015  . Personal history of tobacco use, presenting hazards to health 02/18/2015   quit 06/2011, restarted 2014  . Primary cancer of bladder ([place]) 2016   [place]  . Primary lung cancer ([place]) 2016  . Shortness of breath dyspnea    OCC WITH EXERTION    SURGICAL HISTORY: Past Surgical History:  Procedure Laterality Date  . A FLUTTER ABLATION  03/2010  . carotid US  03/2010   WNL  . CATARACT EXTRACTION    . COLONOSCOPY  09/2011   polyps, rpt due 5 yrs, mild diverticulosis [name])  .  CYSTOSCOPY W/ RETROGRADES Bilateral 04/06/2015   Procedure: CYSTOSCOPY WITH RETROGRADE PYELOGRAM;  Surgeon: [name], MD;  Location: [facility];  Service: Urology;  Laterality: Bilateral;  . ELECTROMAGNETIC NAVIGATION BROCHOSCOPY N/A 03/02/2015   Procedure: ELECTROMAGNETIC NAVIGATION BRONCHOSCOPY;  Surgeon: [name], MD;  Location: [facility];  Service: Cardiopulmonary;  Laterality: N/A;  . [place]  . PORTACATH PLACEMENT N/A 08/10/2015   Procedure: INSERTION PORT-A-CATH;  Surgeon: [name], MD;  Location: [facility];  Service: General;  Laterality: N/A;  . [place]  . TRANSURETHRAL RESECTION OF BLADDER TUMOR N/A 04/06/2015   Procedure: TRANSURETHRAL  RESECTION OF BLADDER TUMOR (TURBT) right ureteral stent placement;  Surgeon: [name], MD;  Location: [facility];  Service: Urology;  Laterality: N/A;  . TRANSURETHRAL RESECTION OF BLADDER TUMOR N/A 07/13/2015   Procedure: TRANSURETHRAL RESECTION OF BLADDER TUMOR (TURBT);  Surgeon: [name], MD;  Location: [facility];  Service: Urology;  Laterality: N/A;  . urinary stent removal  04/14/15    SOCIAL HISTORY: Patient is retired from Press [employer]. He lives in [place]. Social History   Social History  . Marital status: Married    Spouse name: N/A  . Number of children: N/A  . Years of education: N/A   Occupational History  . Not on file.   Social History Main Topics  . Smoking status: Former Smoker    Packs/day: 1.50    Years: 61.00    Types: Cigarettes    Quit date: 03/08/2015  . Smokeless tobacco: Never Used     Comment: cut back 0.5 cigarettes--stopped 03/07/15; now now chantix  . Alcohol use No     Comment: has not drank in 37 years  . Drug use: No  . Sexual activity: Not on file   Other Topics Concern  . Not on file   Social History Narrative   Caffeine: 1 cup decaf/day   Lives with wife   Occupation: retired, worked Press photographer   Activity: gym 3x/wk, Immunologist, hobbies (paint, building things)   Diet: healthy, good water, fruits/vegetables daily, red meat 1x/wk    FAMILY HISTORY: Family History  Problem Relation Age of Onset  . Cervical cancer Mother 18  . Hypertension Father   . Stroke Brother 74  . Tremor Father     and several uncles/aunts (not parkinson's)  . Bladder Cancer Brother 50  . Prostate cancer Neg Hx   . Kidney cancer Neg Hx     ALLERGIES:  has No Known Allergies.  MEDICATIONS:  Current Outpatient Prescriptions  Medication Sig Dispense Refill  . albuterol (PROVENTIL HFA;VENTOLIN HFA) 108 (90 Base) MCG/ACT inhaler Inhale 2 puffs into the lungs every 6 (six) hours as needed for wheezing or shortness of breath. 1 Inhaler 2  . famotidine  (PEPCID) 10 MG tablet Take 10 mg by mouth 2 (two) times daily.     . fluticasone furoate-vilanterol (BREO ELLIPTA) 100-25 MCG/INH AEPB Inhale 1 puff into the lungs daily. (Patient taking differently: Inhale 1 puff into the lungs every morning. ) 60 each 5  . furosemide (LASIX) 40 MG tablet Take 1 tablet (40 mg total) by mouth daily. 30 tablet 2  . gabapentin (NEURONTIN) 300 MG capsule Take 2 capsules (600 mg total) by mouth 3 (three) times daily. 180 capsule 1  . HYDROcodone-acetaminophen (NORCO) 5-325 MG tablet Take 1 tablet by mouth every 8 (eight) hours as needed for moderate pain. 60 tablet 0  . lidocaine-prilocaine (EMLA) cream Apply 1 application topically as needed. To  port a cath site 30 g 6  . lisinopril (PRINIVIL,ZESTRIL) 10 MG tablet Take 1 tablet (10 mg total) by mouth daily. (Patient taking differently: Take 10 mg by mouth every evening. ) 90 tablet 3  . metoprolol tartrate (LOPRESSOR) 25 MG tablet Take 0.5 tablets (12.5 mg total) by mouth 2 (two) times daily. 90 tablet 3  . Multiple Vitamins-Minerals (MULTIVITAMIN WITH MINERALS) tablet Take 1 tablet by mouth daily.    . potassium chloride SA (K-DUR,KLOR-CON) 20 MEQ tablet Take 1 tablet (20 mEq total) by mouth 2 (two) times daily. X 3 weeks 40 tablet 2  . umeclidinium bromide (INCRUSE ELLIPTA) 62.5 MCG/INH AEPB Inhale 1 puff into the lungs daily. (Patient taking differently: Inhale 1 puff into the lungs every morning. ) 30 each 5   No current facility-administered medications for this visit.   Marland Kitchen  PHYSICAL EXAMINATION: ECOG PERFORMANCE STATUS: 0 - Asymptomatic  Vitals:   01/04/16 1457  BP: 128/73  Pulse: 85  Resp: 18  Temp: 98.4 F (36.9 C)   Filed Weights   01/04/16 1457  Weight: 220 lb (99.8 kg)  Pulse ox 94% on room air.  GENERAL:alert, no distress and comfortable. He is Accompanied by his wife. SKIN: skin color, texture, turgor are normal, no rashes or significant lesions EYES: normal, conjunctiva are pink and  non-injected, sclera clear OROPHARYNX:no exudate, no erythema and lips, buccal mucosa, and tongue normal  NECK: supple, thyroid normal size, non-tender, without nodularity LYMPH:  no palpable lymphadenopathy in the cervical, axillary or inguinal LUNGS: clear to auscultation and percussion with normal breathing effort HEART: regular rate & rhythm and no murmurs and bilateral 2+lower extremity edema ABDOMEN:abdomen soft, non-tender and normal bowel sounds Musculoskeletal:no cyanosis of digits and no clubbing  PSYCH: alert & oriented x 3 with fluent speech NEURO: no focal motor/sensory deficits  LABORATORY DATA:  I have reviewed the data as listed Lab Results  Component Value Date   WBC 6.8 01/04/2016   HGB 12.2 (L) 01/04/2016   HCT 35.9 (L) 01/04/2016   MCV 95.1 01/04/2016   PLT 244 01/04/2016    Recent Labs  09/22/15 0829 10/13/15 0840  12/05/15 1050 12/23/15 1006 01/04/16 1409  NA 138 139  < > 138 136 136  K 3.9 3.8  < > 3.9 4.3 3.9  CL 106 105  < > 104 103 103  CO2 27 26  < > '28 26 26  ' GLUCOSE 167* 180*  < > 140* 156* 110*  BUN 22* 19  < > 24* 33* 17  CREATININE 0.76 0.76  < > 0.87 1.09 0.88  CALCIUM 8.6* 8.7*  < > 8.8* 8.8* 8.7*  GFRNONAA >60 >60  < > >60 >60 >60  GFRAA >60 >60  < > >60 >60 >60  PROT 7.4 7.6  --  7.7  --   --   ALBUMIN 3.7 3.8  --  3.9  --   --   AST 26 31  --  39  --   --   ALT 32 35  --  52  --   --   ALKPHOS 113 121  --  105  --   --   BILITOT 0.5 0.5  --  0.4  --   --   < > = values in this interval not displayed.  RADIOGRAPHIC STUDIES:  ASSESSMENT & PLAN:   Malignant neoplasm of right upper lobe of lung ([place]) # Right UL POORLY DIFF CA- T3 N0  Currently status post radiation [  finished feb 2017];  Finished adjuvant chemotherapy [end of April 2017; June 2017- CT scan shows continued partial response. Plan CT scan in October 2017.  # Bil LE swelling/weight gain-likely from Neurontin; decrease dose of neurontin to 100 BID [hx of tremors].   Patient is reiterated to start taking Lasix 40 mg once a day/ along with potassium. If not improving would recommend getting abdomen pelvis CT scan. 2-D echo ultrasound of the legs negative.  # Tingling and numbness grade 2 - fingertips and toes. From Taxol.recommend. hydrocodone every 8 hours as needed. Leg elevation.   # follow-up in 2 weeks with a BMP. Check BNP.     Cammie Sickle, MD 01/04/2016 4:47 PM

## 2016-01-04 NOTE — Progress Notes (Signed)
Patient states he continues to have pain in bilateral feet and in his fingers.  He has had to increase his pain medication over the past several days.

## 2016-01-04 NOTE — Assessment & Plan Note (Addendum)
#   Right UL POORLY DIFF CA- T3 N0  Currently status post radiation [finished feb 2017];  Finished adjuvant chemotherapy [end of April 2017; June 2017- CT scan shows continued partial response. Plan CT scan in October 2017.  # Bil LE swelling/weight gain-likely from Neurontin; decrease dose of neurontin to 100 BID [hx of tremors].  Patient is reiterated to start taking Lasix 40 mg once a day/ along with potassium. If not improving would recommend getting abdomen pelvis CT scan. 2-D echo ultrasound of the legs negative.  # Tingling and numbness grade 2 - fingertips and toes. From Taxol.recommend. hydrocodone every 8 hours as needed. Leg elevation.   # follow-up in 2 weeks with a BMP. Check BNP.

## 2016-01-16 DIAGNOSIS — J449 Chronic obstructive pulmonary disease, unspecified: Secondary | ICD-10-CM | POA: Diagnosis not present

## 2016-01-17 ENCOUNTER — Other Ambulatory Visit: Payer: Self-pay | Admitting: *Deleted

## 2016-01-17 DIAGNOSIS — C3490 Malignant neoplasm of unspecified part of unspecified bronchus or lung: Secondary | ICD-10-CM

## 2016-01-20 ENCOUNTER — Inpatient Hospital Stay: Payer: Medicare Other | Attending: Internal Medicine | Admitting: Internal Medicine

## 2016-01-20 ENCOUNTER — Inpatient Hospital Stay: Payer: Medicare Other

## 2016-01-20 VITALS — BP 138/74 | HR 88 | Temp 98.8°F | Resp 18 | Wt 221.0 lb

## 2016-01-20 DIAGNOSIS — Z8551 Personal history of malignant neoplasm of bladder: Secondary | ICD-10-CM

## 2016-01-20 DIAGNOSIS — Z8049 Family history of malignant neoplasm of other genital organs: Secondary | ICD-10-CM | POA: Diagnosis not present

## 2016-01-20 DIAGNOSIS — M129 Arthropathy, unspecified: Secondary | ICD-10-CM | POA: Diagnosis not present

## 2016-01-20 DIAGNOSIS — G629 Polyneuropathy, unspecified: Secondary | ICD-10-CM

## 2016-01-20 DIAGNOSIS — Z9221 Personal history of antineoplastic chemotherapy: Secondary | ICD-10-CM | POA: Diagnosis not present

## 2016-01-20 DIAGNOSIS — I499 Cardiac arrhythmia, unspecified: Secondary | ICD-10-CM

## 2016-01-20 DIAGNOSIS — C3411 Malignant neoplasm of upper lobe, right bronchus or lung: Secondary | ICD-10-CM

## 2016-01-20 DIAGNOSIS — G250 Essential tremor: Secondary | ICD-10-CM | POA: Diagnosis not present

## 2016-01-20 DIAGNOSIS — R600 Localized edema: Secondary | ICD-10-CM

## 2016-01-20 DIAGNOSIS — E785 Hyperlipidemia, unspecified: Secondary | ICD-10-CM

## 2016-01-20 DIAGNOSIS — R202 Paresthesia of skin: Secondary | ICD-10-CM | POA: Diagnosis not present

## 2016-01-20 DIAGNOSIS — J449 Chronic obstructive pulmonary disease, unspecified: Secondary | ICD-10-CM | POA: Diagnosis not present

## 2016-01-20 DIAGNOSIS — I10 Essential (primary) hypertension: Secondary | ICD-10-CM | POA: Diagnosis not present

## 2016-01-20 DIAGNOSIS — G8929 Other chronic pain: Secondary | ICD-10-CM

## 2016-01-20 DIAGNOSIS — Z8052 Family history of malignant neoplasm of bladder: Secondary | ICD-10-CM

## 2016-01-20 DIAGNOSIS — Z79899 Other long term (current) drug therapy: Secondary | ICD-10-CM

## 2016-01-20 DIAGNOSIS — K219 Gastro-esophageal reflux disease without esophagitis: Secondary | ICD-10-CM | POA: Diagnosis not present

## 2016-01-20 DIAGNOSIS — N400 Enlarged prostate without lower urinary tract symptoms: Secondary | ICD-10-CM | POA: Diagnosis not present

## 2016-01-20 DIAGNOSIS — Z86010 Personal history of colonic polyps: Secondary | ICD-10-CM

## 2016-01-20 DIAGNOSIS — R200 Anesthesia of skin: Secondary | ICD-10-CM

## 2016-01-20 DIAGNOSIS — M7989 Other specified soft tissue disorders: Secondary | ICD-10-CM

## 2016-01-20 DIAGNOSIS — M545 Low back pain: Secondary | ICD-10-CM | POA: Diagnosis not present

## 2016-01-20 DIAGNOSIS — Z87891 Personal history of nicotine dependence: Secondary | ICD-10-CM

## 2016-01-20 DIAGNOSIS — C3490 Malignant neoplasm of unspecified part of unspecified bronchus or lung: Secondary | ICD-10-CM

## 2016-01-20 LAB — BASIC METABOLIC PANEL
Anion gap: 7 (ref 5–15)
BUN: 21 mg/dL — AB (ref 6–20)
CHLORIDE: 104 mmol/L (ref 101–111)
CO2: 27 mmol/L (ref 22–32)
CREATININE: 0.97 mg/dL (ref 0.61–1.24)
Calcium: 8.9 mg/dL (ref 8.9–10.3)
GFR calc Af Amer: >60 mL/min (ref >60)
GFR calc non Af Amer: >60 mL/min (ref >60)
GLUCOSE: 189 mg/dL — AB (ref 65–99)
POTASSIUM: 3.8 mmol/L (ref 3.5–5.1)
Sodium: 138 mmol/L (ref 135–145)

## 2016-01-20 NOTE — Assessment & Plan Note (Addendum)
#   Bil LE swelling/weight gain- decrease dose of neurontin to 100 BID [hx of tremors].  Patient is reiterated to start taking Lasix 40 mg once a day/ along with potassium. Recommend stockings.   # Peripheral neuropathy- on hydrocodone q 8 h; will call for refill; cont  recommend neurology evaluation.   # Right UL POORLY DIFF CA- T3 N0  Currently status post radiation [finished feb 2017];  Finished adjuvant chemotherapy [end of April 2017; June 2017- CT scan shows continued partial response. Plan CT scan in October 2017. Ordered next visit.  # follow-up in 4 weeks with a BMP.

## 2016-01-20 NOTE — Progress Notes (Signed)
[place] NOTE  Patient Care Team: [name], MD as PCP - General (Family Medicine)  CHIEF COMPLAINTS/PURPOSE OF CONSULTATION:   Oncology History    # OCT 2016- RUL POORLY DIFF CA [clinically non-small cell] ; STAGE IIB [T3-2 separate nodules in RUL; N0] [s/p CT guided bx]- s/p RT [finished mid jan 2017]; FEB 2017-CT scan- regression of RUL nodules;   # March 2017- Start carbo-taxol q 3 w x4; June 2017- CT- Improvement of RUL nodule/ Stable RUL nodules.   # OCT 2016- 2 sub cm nodules in Right LL- Feb CT 2017- STABLE; June 2017- resolved  # May- 2017- G2 PN  # OCT 2016-NON-invasive bladder urothelial cancer; high grade [Dr. [name]; s/p TURBT [Feb 2017- NEG]; declined BCG  # Pneumothorax [oct 2016 s/p CT Bx];  # MOLECULAR STUDIES- PDL-1 by IHC/keytruda- 11-20%.        Malignant neoplasm of lung ([place])   04/01/2015 Initial Diagnosis    Malignant neoplasm of lung (HCC)      Malignant neoplasm of right upper lobe of lung ([place])   12/20/2015 Initial Diagnosis    Malignant neoplasm of right upper lobe of lung ([facility])        HISTORY OF PRESENTING ILLNESS:  Joseph Graham 75 y.o. male with a history of long-standing smoking/COPD-  Non-small cell/ poorly differentiated malignancy of the right upper lobe  T3 N0 status post radiation; followed by adjuvant chemotherapy with [place] Taxol 4 cycles; Is here for follow-up of his bilateral extremity/ Neuropathy.  Patients swelling in legs is improved. However not back to baseline. Continue to have intermittent swelling in the legs. Continues to have intermittent tingling and numbness in his feet. Patient is on Lasix.  ROS: A complete 10 point review of system is done which is negative for mentioned above in history of present illness.  MEDICAL HISTORY:  Past Medical History:  Diagnosis Date  . Alcohol abuse, in remission    remote  . Arthritis    BACK AND NECK  . Benign essential tremor    improved  on metoprolol and gabapentin  . BPH (benign prostatic hypertrophy) 12/30/2012  . Cardiac arrhythmia 03/2010   h/o a flutter and CM, s/p ablation, normal stress test  . Chronic low back pain    MRI 2004, multilevel DDD  . Colon polyps    next colonoscopy due around 2018  . Complication of anesthesia    DID NOT GET COMPLETELY NUMB WITH TONSILLECTOMY  . COPD (chronic obstructive pulmonary disease) ([place])   . Dyslipidemia    mild off meds  . GERD (gastroesophageal reflux disease)   . HTN (hypertension)   . Multiple pulmonary nodules 02/2015  . Personal history of tobacco use, presenting hazards to health 02/18/2015   quit 06/2011, restarted 2014  . Primary cancer of bladder ([place]) 2016   [place]  . Primary lung cancer ([place]) 2016  . Shortness of breath dyspnea    OCC WITH EXERTION    SURGICAL HISTORY: Past Surgical History:  Procedure Laterality Date  . A FLUTTER ABLATION  03/2010  . carotid US  03/2010   WNL  . CATARACT EXTRACTION    . COLONOSCOPY  09/2011   polyps, rpt due 5 yrs, mild diverticulosis [name])  . CYSTOSCOPY W/ RETROGRADES Bilateral 04/06/2015   Procedure: CYSTOSCOPY WITH RETROGRADE PYELOGRAM;  Surgeon: [name], MD;  Location: [facility];  Service: Urology;  Laterality: Bilateral;  . ELECTROMAGNETIC NAVIGATION BROCHOSCOPY N/A 03/02/2015   Procedure: ELECTROMAGNETIC NAVIGATION  BRONCHOSCOPY;  Surgeon: [name], MD;  Location: [facility];  Service: Cardiopulmonary;  Laterality: N/A;  . [place]  . PORTACATH PLACEMENT N/A 08/10/2015   Procedure: INSERTION PORT-A-CATH;  Surgeon: [name], MD;  Location: [facility];  Service: General;  Laterality: N/A;  . [place]  . TRANSURETHRAL RESECTION OF BLADDER TUMOR N/A 04/06/2015   Procedure: TRANSURETHRAL RESECTION OF BLADDER TUMOR (TURBT) right ureteral stent placement;  Surgeon: [name], MD;  Location: [facility];  Service: Urology;  Laterality: N/A;  . TRANSURETHRAL RESECTION OF BLADDER  TUMOR N/A 07/13/2015   Procedure: TRANSURETHRAL RESECTION OF BLADDER TUMOR (TURBT);  Surgeon: [name], MD;  Location: [facility];  Service: Urology;  Laterality: N/A;  . 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 Social History   Social History  . Marital status: Married    Spouse name: N/A  . Number of children: N/A  . Years of education: N/A   Occupational History  . Not on file.   Social History Main Topics  . Smoking status: Former Smoker    Packs/day: 1.50    Years: 61.00    Types: Cigarettes    Quit date: 03/08/2015  . Smokeless tobacco: Never Used     Comment: cut back 0.5 cigarettes--stopped 03/07/15; now now chantix  . Alcohol use No     Comment: has not drank in 37 years  . Drug use: No  . Sexual activity: Not on file   Other Topics Concern  . Not on file   Social History Narrative   Caffeine: 1 cup decaf/day   Lives with wife   Occupation: retired, worked Press photographer   Activity: gym 3x/wk, Immunologist, hobbies (paint, building things)   Diet: healthy, good water, fruits/vegetables daily, red meat 1x/wk    FAMILY HISTORY: Family History  Problem Relation Age of Onset  . Cervical cancer Mother 70  . Hypertension Father   . Stroke Brother 51  . Tremor Father     and several uncles/aunts (not parkinson's)  . Bladder Cancer Brother 24  . Prostate cancer Neg Hx   . Kidney cancer Neg Hx     ALLERGIES:  has No Known Allergies.  MEDICATIONS:  Current Outpatient Prescriptions  Medication Sig Dispense Refill  . albuterol (PROVENTIL HFA;VENTOLIN HFA) 108 (90 Base) MCG/ACT inhaler Inhale 2 puffs into the lungs every 6 (six) hours as needed for wheezing or shortness of breath. 1 Inhaler 2  . famotidine (PEPCID) 10 MG tablet Take 10 mg by mouth 2 (two) times daily.     . fluticasone furoate-vilanterol (BREO ELLIPTA) 100-25 MCG/INH AEPB Inhale 1 puff into the lungs daily. (Patient taking differently:  Inhale 1 puff into the lungs every morning. ) 60 each 5  . furosemide (LASIX) 40 MG tablet Take 1 tablet (40 mg total) by mouth daily. 30 tablet 2  . gabapentin (NEURONTIN) 300 MG capsule Take 2 capsules (600 mg total) by mouth 3 (three) times daily. 180 capsule 1  . HYDROcodone-acetaminophen (NORCO) 5-325 MG tablet Take 1 tablet by mouth every 8 (eight) hours as needed for moderate pain. 60 tablet 0  . lidocaine-prilocaine (EMLA) cream Apply 1 application topically as needed. To port a cath site 30 g 6  . lisinopril (PRINIVIL,ZESTRIL) 10 MG tablet Take 1 tablet (10 mg total) by mouth daily. (Patient taking differently: Take 10 mg by mouth every evening. ) 90 tablet 3  . metoprolol  tartrate (LOPRESSOR) 25 MG tablet Take 0.5 tablets (12.5 mg total) by mouth 2 (two) times daily. 90 tablet 3  . Multiple Vitamins-Minerals (MULTIVITAMIN WITH MINERALS) tablet Take 1 tablet by mouth daily.    . potassium chloride SA (K-DUR,KLOR-CON) 20 MEQ tablet Take 1 tablet (20 mEq total) by mouth 2 (two) times daily. X 3 weeks 40 tablet 2  . umeclidinium bromide (INCRUSE ELLIPTA) 62.5 MCG/INH AEPB Inhale 1 puff into the lungs daily. (Patient taking differently: Inhale 1 puff into the lungs every morning. ) 30 each 5   No current facility-administered medications for this visit.   Marland Kitchen  PHYSICAL EXAMINATION: ECOG PERFORMANCE STATUS: 0 - Asymptomatic  Vitals:   01/20/16 1039  BP: 138/74  Pulse: 88  Resp: 18  Temp: 98.8 F (37.1 C)   Filed Weights   01/20/16 1039  Weight: 221 lb (100.2 kg)  Pulse ox 94% on room air.  GENERAL:alert, no distress and comfortable. He is Accompanied by his wife. SKIN: skin color, texture, turgor are normal, no rashes or significant lesions EYES: normal, conjunctiva are pink and non-injected, sclera clear OROPHARYNX:no exudate, no erythema and lips, buccal mucosa, and tongue normal  NECK: supple, thyroid normal size, non-tender, without nodularity LYMPH:  no palpable  lymphadenopathy in the cervical, axillary or inguinal LUNGS: clear to auscultation and percussion with normal breathing effort HEART: regular rate & rhythm and no murmurs and bilateral 2+lower extremity edema ABDOMEN:abdomen soft, non-tender and normal bowel sounds Musculoskeletal:no cyanosis of digits and no clubbing  PSYCH: alert & oriented x 3 with fluent speech NEURO: no focal motor/sensory deficits  LABORATORY DATA:  I have reviewed the data as listed Lab Results  Component Value Date   WBC 6.8 01/04/2016   HGB 12.2 (L) 01/04/2016   HCT 35.9 (L) 01/04/2016   MCV 95.1 01/04/2016   PLT 244 01/04/2016    Recent Labs  09/22/15 0829 10/13/15 0840  12/05/15 1050 12/23/15 1006 01/04/16 1409 01/20/16 1001  NA 138 139  < > 138 136 136 138  K 3.9 3.8  < > 3.9 4.3 3.9 3.8  CL 106 105  < > 104 103 103 104  CO2 27 26  < > '28 26 26 27  ' GLUCOSE 167* 180*  < > 140* 156* 110* 189*  BUN 22* 19  < > 24* 33* 17 21*  CREATININE 0.76 0.76  < > 0.87 1.09 0.88 0.97  CALCIUM 8.6* 8.7*  < > 8.8* 8.8* 8.7* 8.9  GFRNONAA >60 >60  < > >60 >60 >60 >60  GFRAA >60 >60  < > >60 >60 >60 >60  PROT 7.4 7.6  --  7.7  --   --   --   ALBUMIN 3.7 3.8  --  3.9  --   --   --   AST 26 31  --  39  --   --   --   ALT 32 35  --  52  --   --   --   ALKPHOS 113 121  --  105  --   --   --   BILITOT 0.5 0.5  --  0.4  --   --   --   < > = values in this interval not displayed.  RADIOGRAPHIC STUDIES:  ASSESSMENT & PLAN:   Malignant neoplasm of right upper lobe of lung ([place]) # Bil LE swelling/weight gain- decrease dose of neurontin to 100 BID [hx of tremors].  Patient is reiterated to start taking Lasix  40 mg once a day/ along with potassium. Recommend stockings.   # Peripheral neuropathy- on hydrocodone q 8 h; will call for refill; cont  recommend neurology evaluation.   # Right UL POORLY DIFF CA- T3 N0  Currently status post radiation [finished feb 2017];  Finished adjuvant chemotherapy [end of April 2017;  June 2017- CT scan shows continued partial response. Plan CT scan in October 2017. Ordered next visit.  # follow-up in 4 weeks with a BMP.      Cammie Sickle, MD 01/20/2016 5:01 PM

## 2016-02-09 ENCOUNTER — Other Ambulatory Visit: Payer: Self-pay | Admitting: *Deleted

## 2016-02-09 DIAGNOSIS — C3491 Malignant neoplasm of unspecified part of right bronchus or lung: Secondary | ICD-10-CM

## 2016-02-09 DIAGNOSIS — T451X5A Adverse effect of antineoplastic and immunosuppressive drugs, initial encounter: Secondary | ICD-10-CM

## 2016-02-09 DIAGNOSIS — G620 Drug-induced polyneuropathy: Secondary | ICD-10-CM

## 2016-02-09 DIAGNOSIS — C3411 Malignant neoplasm of upper lobe, right bronchus or lung: Secondary | ICD-10-CM

## 2016-02-09 MED ORDER — HYDROCODONE-ACETAMINOPHEN 5-325 MG PO TABS: 1.0000 | ORAL_TABLET | Freq: Three times a day (TID) | ORAL | 0 refills | Status: DC | PRN

## 2016-02-16 DIAGNOSIS — J449 Chronic obstructive pulmonary disease, unspecified: Secondary | ICD-10-CM | POA: Diagnosis not present

## 2016-02-17 ENCOUNTER — Inpatient Hospital Stay: Payer: Medicare Other | Attending: Internal Medicine

## 2016-02-17 ENCOUNTER — Inpatient Hospital Stay (HOSPITAL_BASED_OUTPATIENT_CLINIC_OR_DEPARTMENT_OTHER): Payer: Medicare Other | Admitting: Internal Medicine

## 2016-02-17 ENCOUNTER — Encounter: Payer: Self-pay | Admitting: Internal Medicine

## 2016-02-17 ENCOUNTER — Inpatient Hospital Stay: Payer: Medicare Other

## 2016-02-17 VITALS — BP 137/70 | HR 76 | Temp 96.0°F | Resp 17 | Ht 69.0 in | Wt 219.4 lb

## 2016-02-17 DIAGNOSIS — I10 Essential (primary) hypertension: Secondary | ICD-10-CM | POA: Insufficient documentation

## 2016-02-17 DIAGNOSIS — R251 Tremor, unspecified: Secondary | ICD-10-CM

## 2016-02-17 DIAGNOSIS — Z79899 Other long term (current) drug therapy: Secondary | ICD-10-CM | POA: Insufficient documentation

## 2016-02-17 DIAGNOSIS — N400 Enlarged prostate without lower urinary tract symptoms: Secondary | ICD-10-CM | POA: Insufficient documentation

## 2016-02-17 DIAGNOSIS — K219 Gastro-esophageal reflux disease without esophagitis: Secondary | ICD-10-CM | POA: Diagnosis not present

## 2016-02-17 DIAGNOSIS — E785 Hyperlipidemia, unspecified: Secondary | ICD-10-CM | POA: Insufficient documentation

## 2016-02-17 DIAGNOSIS — M7989 Other specified soft tissue disorders: Secondary | ICD-10-CM | POA: Insufficient documentation

## 2016-02-17 DIAGNOSIS — J939 Pneumothorax, unspecified: Secondary | ICD-10-CM | POA: Diagnosis not present

## 2016-02-17 DIAGNOSIS — R0602 Shortness of breath: Secondary | ICD-10-CM | POA: Diagnosis not present

## 2016-02-17 DIAGNOSIS — M545 Low back pain: Secondary | ICD-10-CM | POA: Insufficient documentation

## 2016-02-17 DIAGNOSIS — Z923 Personal history of irradiation: Secondary | ICD-10-CM | POA: Insufficient documentation

## 2016-02-17 DIAGNOSIS — J449 Chronic obstructive pulmonary disease, unspecified: Secondary | ICD-10-CM | POA: Insufficient documentation

## 2016-02-17 DIAGNOSIS — C3411 Malignant neoplasm of upper lobe, right bronchus or lung: Secondary | ICD-10-CM

## 2016-02-17 DIAGNOSIS — Z9221 Personal history of antineoplastic chemotherapy: Secondary | ICD-10-CM | POA: Insufficient documentation

## 2016-02-17 DIAGNOSIS — M129 Arthropathy, unspecified: Secondary | ICD-10-CM | POA: Insufficient documentation

## 2016-02-17 DIAGNOSIS — Z8551 Personal history of malignant neoplasm of bladder: Secondary | ICD-10-CM

## 2016-02-17 DIAGNOSIS — F17210 Nicotine dependence, cigarettes, uncomplicated: Secondary | ICD-10-CM | POA: Diagnosis not present

## 2016-02-17 DIAGNOSIS — I499 Cardiac arrhythmia, unspecified: Secondary | ICD-10-CM | POA: Diagnosis not present

## 2016-02-17 DIAGNOSIS — G629 Polyneuropathy, unspecified: Secondary | ICD-10-CM | POA: Insufficient documentation

## 2016-02-17 LAB — BASIC METABOLIC PANEL
Anion gap: 8 (ref 5–15)
BUN: 21 mg/dL — ABNORMAL HIGH (ref 6–20)
CALCIUM: 8.7 mg/dL — AB (ref 8.9–10.3)
CO2: 25 mmol/L (ref 22–32)
CREATININE: 0.94 mg/dL (ref 0.61–1.24)
Chloride: 103 mmol/L (ref 101–111)
GFR calc non Af Amer: >60 mL/min (ref >60)
Glucose, Bld: 148 mg/dL — ABNORMAL HIGH (ref 65–99)
Potassium: 3.8 mmol/L (ref 3.5–5.1)
SODIUM: 136 mmol/L (ref 135–145)

## 2016-02-17 MED ORDER — HEPARIN SOD (PORK) LOCK FLUSH 100 UNIT/ML IV SOLN
INTRAVENOUS | Status: AC
  Filled 2016-02-17: qty 5

## 2016-02-17 MED ORDER — HEPARIN SOD (PORK) LOCK FLUSH 100 UNIT/ML IV SOLN
500.0000 [IU] | Freq: Once | INTRAVENOUS | Status: AC
  Administered 2016-02-17: 500 [IU] via INTRAVENOUS

## 2016-02-17 MED ORDER — SODIUM CHLORIDE 0.9% FLUSH
10.0000 mL | Freq: Once | INTRAVENOUS | Status: AC
  Administered 2016-02-17: 10 mL via INTRAVENOUS
  Filled 2016-02-17: qty 10

## 2016-02-17 NOTE — Assessment & Plan Note (Addendum)
#   Bil LE swelling/weight gain-Improved; Taking Lasix 40 mg once a day/ along with potassium. No changes.   # Peripheral neuropathy- on hydrocodone q 8 h [needs 1 month refils]; will call for refill; awaiting neurology referral in NOV '[]'$   # Right UL POORLY DIFF CA- T3 N0  Currently status post radiation [finished feb 2017];  Finished adjuvant chemotherapy [end of April 2017; June 2017- CT scan shows continued partial response. Plan CT scan in October 2017. Discussed that he might be candidate for immunotherapy if progressive disease noted.  # port- interested in having it taken out; recommend keeping the port for now; revisit after the CAT scan.  # Follow-up planned in October 2 week after the CAT scan.

## 2016-02-17 NOTE — Progress Notes (Signed)
Pt reports getting nosebleeds on one side of those nose maybe once a week.  Pt believes it is related to breathing machine and dryness of air.  Pt reports swelling in feet have improved.

## 2016-02-17 NOTE — Progress Notes (Signed)
[name] NOTE  Patient Care Team: [name], MD as PCP - General (Family Medicine)  CHIEF COMPLAINTS/PURPOSE OF CONSULTATION:   Oncology History    # OCT 2016- RUL POORLY DIFF CA [clinically non-small cell] ; STAGE IIB [T3-2 separate nodules in RUL; N0] [s/p CT guided bx]- s/p RT [finished mid jan 2017]; FEB 2017-CT scan- regression of RUL nodules;   # March 2017- Start carbo-taxol q 3 w x4; June 2017- CT- Improvement of RUL nodule/ Stable RUL nodules.   # OCT 2016- 2 sub cm nodules in Right LL- Feb CT 2017- STABLE; June 2017- resolved  # May- 2017- G2 PN  # OCT 2016-NON-invasive bladder urothelial cancer; high grade [Dr. [name]; s/p TURBT [Feb 2017- NEG]; declined BCG  # Pneumothorax [oct 2016 s/p CT Bx];  # MOLECULAR STUDIES- PDL-1 by IHC/keytruda- 11-20%.        Malignant neoplasm of lung ([place])   04/01/2015 Initial Diagnosis    Malignant neoplasm of lung (H[facility])       Malignant neoplasm of right upper lobe of lung ([place])   12/20/2015 Initial Diagnosis    Malignant neoplasm of right upper lobe of lung (HCC)         HISTORY OF PRESENTING ILLNESS:  Joseph Graham 75 y.o. male with a history of long-standing smoking/COPD-  Non-small cell/ poorly differentiated malignancy of the right upper lobe  T3 N0 status post radiation; followed by adjuvant chemotherapy with [place] Taxol 4 cycles; Is here for follow-up of his bilateral extremity/ Neuropathy.  Patient notes to have improved swelling in the leg; not completely resolved.Marland Kitchen He is on Lasix. Tolerating it fairly well. He is on hydrocodone  2-3  A day for his pain.   Continues to have intermittent tingling and numbness in his feet.   ROS: A complete 10 point review of system is done which is negative for mentioned above in history of present illness.  MEDICAL HISTORY:  Past Medical History:  Diagnosis Date  . Alcohol abuse, in remission    remote  . Arthritis    BACK AND NECK  .  Benign essential tremor    improved on metoprolol and gabapentin  . BPH (benign prostatic hypertrophy) 12/30/2012  . Cardiac arrhythmia 03/2010   h/o a flutter and CM, s/p ablation, normal stress test  . Chronic low back pain    MRI 2004, multilevel DDD  . Colon polyps    next colonoscopy due around 2018  . Complication of anesthesia    DID NOT GET COMPLETELY NUMB WITH TONSILLECTOMY  . COPD (chronic obstructive pulmonary disease) ([place])   . Dyslipidemia    mild off meds  . GERD (gastroesophageal reflux disease)   . HTN (hypertension)   . Multiple pulmonary nodules 02/2015  . Personal history of tobacco use, presenting hazards to health 02/18/2015   quit 06/2011, restarted 2014  . Primary cancer of bladder ([place]) 2016   [place]  . Primary lung cancer ([place]) 2016  . Shortness of breath dyspnea    OCC WITH EXERTION    SURGICAL HISTORY: Past Surgical History:  Procedure Laterality Date  . A FLUTTER ABLATION  03/2010  . carotid US  03/2010   WNL  . CATARACT EXTRACTION    . COLONOSCOPY  09/2011   polyps, rpt due 5 yrs, mild diverticulosis [name])  . CYSTOSCOPY W/ RETROGRADES Bilateral 04/06/2015   Procedure: CYSTOSCOPY WITH RETROGRADE PYELOGRAM;  Surgeon: [name], MD;  Location: [facility];  Service: Urology;  Laterality: Bilateral;  . ELECTROMAGNETIC NAVIGATION BROCHOSCOPY N/A 03/02/2015   Procedure: ELECTROMAGNETIC NAVIGATION BRONCHOSCOPY;  Surgeon: [name], MD;  Location: [facility];  Service: Cardiopulmonary;  Laterality: N/A;  . [place]  . PORTACATH PLACEMENT N/A 08/10/2015   Procedure: INSERTION PORT-A-CATH;  Surgeon: [name], MD;  Location: [facility];  Service: General;  Laterality: N/A;  . [place]  . TRANSURETHRAL RESECTION OF BLADDER TUMOR N/A 04/06/2015   Procedure: TRANSURETHRAL RESECTION OF BLADDER TUMOR (TURBT) right ureteral stent placement;  Surgeon: [name], MD;  Location: [facility];  Service: Urology;  Laterality: N/A;   . TRANSURETHRAL RESECTION OF BLADDER TUMOR N/A 07/13/2015   Procedure: TRANSURETHRAL RESECTION OF BLADDER TUMOR (TURBT);  Surgeon: [name], MD;  Location: [facility];  Service: Urology;  Laterality: N/A;  . urinary stent removal  04/14/15    SOCIAL HISTORY: Patient is retired from Press photographer. He lives in [place]. Social History   Social History  . Marital status: Married    Spouse name: N/A  . Number of children: N/A  . Years of education: N/A   Occupational History  . Not on file.   Social History Main Topics  . Smoking status: Former Smoker    Packs/day: 1.50    Years: 61.00    Types: Cigarettes    Quit date: 03/08/2015  . Smokeless tobacco: Never Used     Comment: cut back 0.5 cigarettes--stopped 03/07/15; now now chantix  . Alcohol use No     Comment: has not drank in 37 years  . Drug use: No  . Sexual activity: Not on file   Other Topics Concern  . Not on file   Social History Narrative   Caffeine: 1 cup decaf/day   Lives with wife   Occupation: retired, worked Press photographer   Activity: gym 3x/wk, Immunologist, hobbies (paint, building things)   Diet: healthy, good water, fruits/vegetables daily, red meat 1x/wk    FAMILY HISTORY: Family History  Problem Relation Age of Onset  . Cervical cancer Mother 43  . Hypertension Father   . Tremor Father     and several uncles/aunts (not parkinson's)  . Stroke Brother 87  . Bladder Cancer Brother 93  . Prostate cancer Neg Hx   . Kidney cancer Neg Hx     ALLERGIES:  has No Known Allergies.  MEDICATIONS:  Current Outpatient Prescriptions  Medication Sig Dispense Refill  . albuterol (PROVENTIL HFA;VENTOLIN HFA) 108 (90 Base) MCG/ACT inhaler Inhale 2 puffs into the lungs every 6 (six) hours as needed for wheezing or shortness of breath. 1 Inhaler 2  . famotidine (PEPCID) 10 MG tablet Take 10 mg by mouth 2 (two) times daily.     . fluticasone furoate-vilanterol (BREO ELLIPTA) 100-25 MCG/INH AEPB Inhale 1 puff into the  lungs daily. (Patient taking differently: Inhale 1 puff into the lungs every morning. ) 60 each 5  . furosemide (LASIX) 40 MG tablet Take 1 tablet (40 mg total) by mouth daily. 30 tablet 2  . gabapentin (NEURONTIN) 300 MG capsule Take 2 capsules (600 mg total) by mouth 3 (three) times daily. 180 capsule 1  . HYDROcodone-acetaminophen (NORCO) 5-325 MG tablet Take 1 tablet by mouth every 8 (eight) hours as needed for moderate pain. 60 tablet 0  . lidocaine-prilocaine (EMLA) cream Apply 1 application topically as needed. To port a cath site 30 g 6  . lisinopril (PRINIVIL,ZESTRIL) 10 MG tablet Take 1 tablet (10 mg total) by mouth daily. (Patient taking differently:  Take 10 mg by mouth every evening. ) 90 tablet 3  . metoprolol tartrate (LOPRESSOR) 25 MG tablet Take 0.5 tablets (12.5 mg total) by mouth 2 (two) times daily. 90 tablet 3  . Multiple Vitamins-Minerals (MULTIVITAMIN WITH MINERALS) tablet Take 1 tablet by mouth daily.    . potassium chloride SA (K-DUR,KLOR-CON) 20 MEQ tablet Take 1 tablet (20 mEq total) by mouth 2 (two) times daily. X 3 weeks 40 tablet 2  . umeclidinium bromide (INCRUSE ELLIPTA) 62.5 MCG/INH AEPB Inhale 1 puff into the lungs daily. (Patient taking differently: Inhale 1 puff into the lungs every morning. ) 30 each 5   No current facility-administered medications for this visit.   Marland Kitchen  PHYSICAL EXAMINATION: ECOG PERFORMANCE STATUS: 0 - Asymptomatic  Vitals:   02/17/16 0957  BP: 137/70  Pulse: 76  Resp: 17  Temp: (!) 96 F (35.6 C)   Filed Weights   02/17/16 0957  Weight: 219 lb 6.4 oz (99.5 kg)  Pulse ox 94% on room air.  GENERAL:alert, no distress and comfortable. He is Accompanied by his wife. SKIN: skin color, texture, turgor are normal, no rashes or significant lesions EYES: normal, conjunctiva are pink and non-injected, sclera clear OROPHARYNX:no exudate, no erythema and lips, buccal mucosa, and tongue normal  NECK: supple, thyroid normal size, non-tender,  without nodularity LYMPH:  no palpable lymphadenopathy in the cervical, axillary or inguinal LUNGS: clear to auscultation and percussion with normal breathing effort HEART: regular rate & rhythm and no murmurs and bilateral 2+lower extremity edema ABDOMEN:abdomen soft, non-tender and normal bowel sounds Musculoskeletal:no cyanosis of digits and no clubbing  PSYCH: alert & oriented x 3 with fluent speech NEURO: no focal motor/sensory deficits  LABORATORY DATA:  I have reviewed the data as listed Lab Results  Component Value Date   WBC 6.8 01/04/2016   HGB 12.2 (L) 01/04/2016   HCT 35.9 (L) 01/04/2016   MCV 95.1 01/04/2016   PLT 244 01/04/2016    Recent Labs  09/22/15 0829 10/13/15 0840  12/05/15 1050  01/04/16 1409 01/20/16 1001 02/17/16 0851  NA 138 139  < > 138  < > 136 138 136  K 3.9 3.8  < > 3.9  < > 3.9 3.8 3.8  CL 106 105  < > 104  < > 103 104 103  CO2 27 26  < > 28  < > '26 27 25  ' GLUCOSE 167* 180*  < > 140*  < > 110* 189* 148*  BUN 22* 19  < > 24*  < > 17 21* 21*  CREATININE 0.76 0.76  < > 0.87  < > 0.88 0.97 0.94  CALCIUM 8.6* 8.7*  < > 8.8*  < > 8.7* 8.9 8.7*  GFRNONAA >60 >60  < > >60  < > >60 >60 >60  GFRAA >60 >60  < > >60  < > >60 >60 >60  PROT 7.4 7.6  --  7.7  --   --   --   --   ALBUMIN 3.7 3.8  --  3.9  --   --   --   --   AST 26 31  --  39  --   --   --   --   ALT 32 35  --  52  --   --   --   --   ALKPHOS 113 121  --  105  --   --   --   --   BILITOT 0.5 0.5  --  0.4  --   --   --   --   < > = values in this interval not displayed.  RADIOGRAPHIC STUDIES:  ASSESSMENT & PLAN:   Malignant neoplasm of right upper lobe of lung ([place]) # Bil LE swelling/weight gain-Improved; Taking Lasix 40 mg once a day/ along with potassium. No changes.   # Peripheral neuropathy- on hydrocodone q 8 h [needs 1 month refils]; will call for refill; awaiting neurology referral in NOV '[]'   # Right UL POORLY DIFF CA- T3 N0  Currently status post radiation [finished feb  2017];  Finished adjuvant chemotherapy [end of April 2017; June 2017- CT scan shows continued partial response. Plan CT scan in October 2017. Discussed that he might be candidate for immunotherapy if progressive disease noted.  # port- interested in having it taken out; recommend keeping the port for now; revisit after the CAT scan.  # Follow-up planned in October 2 week after the CAT scan.     Cammie Sickle, MD 02/17/2016 1:23 PM

## 2016-02-28 DIAGNOSIS — I10 Essential (primary) hypertension: Secondary | ICD-10-CM | POA: Diagnosis not present

## 2016-02-28 DIAGNOSIS — I4892 Unspecified atrial flutter: Secondary | ICD-10-CM | POA: Diagnosis not present

## 2016-03-02 ENCOUNTER — Encounter: Payer: Self-pay | Admitting: Urology

## 2016-03-02 ENCOUNTER — Ambulatory Visit (INDEPENDENT_AMBULATORY_CARE_PROVIDER_SITE_OTHER): Payer: Medicare Other | Admitting: Urology

## 2016-03-02 VITALS — BP 115/66 | HR 88 | Ht 68.0 in | Wt 222.2 lb

## 2016-03-02 DIAGNOSIS — C679 Malignant neoplasm of bladder, unspecified: Secondary | ICD-10-CM | POA: Diagnosis not present

## 2016-03-02 LAB — MICROSCOPIC EXAMINATION

## 2016-03-02 LAB — URINALYSIS, COMPLETE
BILIRUBIN UA: NEGATIVE
GLUCOSE, UA: NEGATIVE
KETONES UA: NEGATIVE
LEUKOCYTES UA: NEGATIVE
Nitrite, UA: NEGATIVE
PROTEIN UA: NEGATIVE
SPEC GRAV UA: 1.025 (ref 1.005–1.030)
Urobilinogen, Ur: 0.2 mg/dL (ref 0.2–1.0)
pH, UA: 5 (ref 5.0–7.5)

## 2016-03-02 MED ORDER — LIDOCAINE HCL 2 % EX GEL
1.0000 "application " | Freq: Once | CUTANEOUS | Status: AC
  Administered 2016-03-02: 1 via URETHRAL

## 2016-03-02 MED ORDER — CIPROFLOXACIN HCL 500 MG PO TABS
500.0000 mg | ORAL_TABLET | Freq: Once | ORAL | Status: AC
  Administered 2016-03-02: 500 mg via ORAL

## 2016-03-02 NOTE — Progress Notes (Signed)
03/02/2016 9:24 AM   Joseph Graham 1940-06-27 [id number]  Referring provider: [name], MD [address] [place], [place] [address]  Chief Complaint  Patient presents with  . Cysto    bladder cancer     HPI: The patient is a 75 year old gentleman with a past medical history of high-grade 3 cm Ta bladder cancer with no in the muscle specimen. This was originally diagnosed in October 2016. He underwent reresection in February 2017 which showed no evidence of disease. He has not undergone intravesical immunotherapy because he has been undergoing Taxol-based chemotherapy for a primary lung cancer. He ended up not undergoing BCG due to his lung cancer and it's partial response to therapy.   PMH: Past Medical History:  Diagnosis Date  . Alcohol abuse, in remission    remote  . Arthritis    BACK AND NECK  . Benign essential tremor    improved on metoprolol and gabapentin  . BPH (benign prostatic hypertrophy) 12/30/2012  . Cardiac arrhythmia 03/2010   h/o a flutter and CM, s/p ablation, normal stress test  . Chronic low back pain    MRI 2004, multilevel DDD  . Colon polyps    next colonoscopy due around 2018  . Complication of anesthesia    DID NOT GET COMPLETELY NUMB WITH TONSILLECTOMY  . COPD (chronic obstructive pulmonary disease) ([place])   . Dyslipidemia    mild off meds  . GERD (gastroesophageal reflux disease)   . HTN (hypertension)   . Multiple pulmonary nodules 02/2015  . Personal history of tobacco use, presenting hazards to health 02/18/2015   quit 06/2011, restarted 2014  . Primary cancer of bladder ([place]) 2016   [place]  . Primary lung cancer ([place]) 2016  . Shortness of breath dyspnea    OCC WITH EXERTION    Surgical History: Past Surgical History:  Procedure Laterality Date  . A FLUTTER ABLATION  03/2010  . carotid US  03/2010   WNL  . CATARACT EXTRACTION    . COLONOSCOPY  09/2011   polyps, rpt due 5 yrs, mild diverticulosis [name])  .  CYSTOSCOPY W/ RETROGRADES Bilateral 04/06/2015   Procedure: CYSTOSCOPY WITH RETROGRADE PYELOGRAM;  Surgeon: [name], MD;  Location: [facility];  Service: Urology;  Laterality: Bilateral;  . ELECTROMAGNETIC NAVIGATION BROCHOSCOPY N/A 03/02/2015   Procedure: ELECTROMAGNETIC NAVIGATION BRONCHOSCOPY;  Surgeon: [name], MD;  Location: [facility];  Service: Cardiopulmonary;  Laterality: N/A;  . [place]  . PORTACATH PLACEMENT N/A 08/10/2015   Procedure: INSERTION PORT-A-CATH;  Surgeon: [name], MD;  Location: [facility];  Service: General;  Laterality: N/A;  . [place]  . TRANSURETHRAL RESECTION OF BLADDER TUMOR N/A 04/06/2015   Procedure: TRANSURETHRAL RESECTION OF BLADDER TUMOR (TURBT) right ureteral stent placement;  Surgeon: [name], MD;  Location: [facility];  Service: Urology;  Laterality: N/A;  . TRANSURETHRAL RESECTION OF BLADDER TUMOR N/A 07/13/2015   Procedure: TRANSURETHRAL RESECTION OF BLADDER TUMOR (TURBT);  Surgeon: [name], MD;  Location: [facility];  Service: Urology;  Laterality: N/A;  . urinary stent removal  04/14/15    Home Medications:    Medication List       Accurate as of 03/02/16  9:24 AM. Always use your most recent med list.          albuterol 108 (90 Base) MCG/ACT inhaler Commonly known as:  PROVENTIL HFA;VENTOLIN HFA Inhale 2 puffs into the lungs every 6 (six) hours as needed for  wheezing or shortness of breath.   famotidine 10 MG tablet Commonly known as:  PEPCID Take 10 mg by mouth 2 (two) times daily.   fluticasone furoate-vilanterol 100-25 MCG/INH Aepb Commonly known as:  BREO ELLIPTA Inhale 1 puff into the lungs daily.   furosemide 40 MG tablet Commonly known as:  LASIX Take 1 tablet (40 mg total) by mouth daily.   gabapentin 300 MG capsule Commonly known as:  NEURONTIN Take 2 capsules (600 mg total) by mouth 3 (three) times daily.   HYDROcodone-acetaminophen 5-325 MG tablet Commonly known as:   NORCO Take 1 tablet by mouth every 8 (eight) hours as needed for moderate pain.   lidocaine-prilocaine cream Commonly known as:  EMLA Apply 1 application topically as needed. To port a cath site   lisinopril 10 MG tablet Commonly known as:  PRINIVIL,ZESTRIL Take 1 tablet (10 mg total) by mouth daily.   metoprolol tartrate 25 MG tablet Commonly known as:  LOPRESSOR Take 0.5 tablets (12.5 mg total) by mouth 2 (two) times daily.   multivitamin with minerals tablet Take 1 tablet by mouth daily.   potassium chloride SA 20 MEQ tablet Commonly known as:  K-DUR,KLOR-CON Take 1 tablet (20 mEq total) by mouth 2 (two) times daily. X 3 weeks   umeclidinium bromide 62.5 MCG/INH Aepb Commonly known as:  INCRUSE ELLIPTA Inhale 1 puff into the lungs daily.       Allergies: No Known Allergies  Family History: Family History  Problem Relation Age of Onset  . Cervical cancer Mother 55  . Hypertension Father   . Tremor Father     and several uncles/aunts (not parkinson's)  . Stroke Brother 54  . Bladder Cancer Brother 60  . Prostate cancer Neg Hx   . Kidney cancer Neg Hx     Social History:  reports that he quit smoking about a year ago. His smoking use included Cigarettes. He has a 91.50 pack-year smoking history. He has never used smokeless tobacco. He reports that he does not drink alcohol or use drugs.  ROS:                                        Physical Exam: BP 115/66   Pulse 88   Ht '5\' 8"'$  (1.727 m)   Wt 222 lb 3.2 oz (100.8 kg)   BMI 33.79 kg/m   Constitutional:  Alert and oriented, No acute distress. HEENT: [place] AT, moist mucus membranes.  Trachea midline, no masses. Cardiovascular: No clubbing, cyanosis, or edema. Respiratory: Normal respiratory effort, no increased work of breathing. GI: Abdomen is soft, nontender, nondistended, no abdominal masses GU: No CVA tenderness.  Skin: No rashes, bruises or suspicious lesions. Lymph: No cervical or  inguinal adenopathy. Neurologic: Grossly intact, no focal deficits, moving all 4 extremities. Psychiatric: Normal mood and affect.  Laboratory Data: Lab Results  Component Value Date   WBC 6.8 01/04/2016   HGB 12.2 (L) 01/04/2016   HCT 35.9 (L) 01/04/2016   MCV 95.1 01/04/2016   PLT 244 01/04/2016    Lab Results  Component Value Date   CREATININE 0.94 02/17/2016    Lab Results  Component Value Date   PSA 0.60 01/24/2015   PSA 0.68 12/31/2013   PSA 0.67 12/30/2012    No results found for: TESTOSTERONE  Lab Results  Component Value Date   HGBA1C 6.3 01/24/2015    Urinalysis  Component Value Date/Time   APPEARANCEUR Hazy (A) 12/01/2015 0925   GLUCOSEU Negative 12/01/2015 0925   BILIRUBINUR Negative 12/01/2015 0925   PROTEINUR Negative 12/01/2015 0925   NITRITE Negative 12/01/2015 0925   LEUKOCYTESUR 2+ (A) 12/01/2015 0925    Cystoscopy Procedure Note  Patient identification was confirmed, informed consent was obtained, and patient was prepped using Betadine solution.  Lidocaine jelly was administered per urethral meatus.    Preoperative abx where received prior to procedure.     Pre-Procedure: - Inspection reveals a normal caliber ureteral meatus.  Procedure: The flexible cystoscope was introduced without difficulty - No urethral strictures/lesions are present. - Enlarged prostate  - Normal bladder neck - Bilateral ureteral orifices identified - Bladder mucosa  reveals no ulcers, tumors, or lesions - No bladder stones - No trabeculation  Retroflexion shows no significant intravesical lobe.   Post-Procedure: - Patient tolerated the procedure well    Assessment & Plan:    1. High-grade pTa TCC of the bladder -No evidence of disease. Continue q59monthcystoscopy until October 2018. Can increase interval at that time if he continues to be disease free.  Return in about 3 months (around 06/01/2016) for cystoscopy.  BNickie Retort  MD  [facility] [address] [place] [phone]

## 2016-03-08 ENCOUNTER — Other Ambulatory Visit: Payer: Self-pay | Admitting: *Deleted

## 2016-03-08 DIAGNOSIS — C3411 Malignant neoplasm of upper lobe, right bronchus or lung: Secondary | ICD-10-CM

## 2016-03-08 DIAGNOSIS — T451X5A Adverse effect of antineoplastic and immunosuppressive drugs, initial encounter: Secondary | ICD-10-CM

## 2016-03-08 DIAGNOSIS — G620 Drug-induced polyneuropathy: Secondary | ICD-10-CM

## 2016-03-08 DIAGNOSIS — C3491 Malignant neoplasm of unspecified part of right bronchus or lung: Secondary | ICD-10-CM

## 2016-03-08 MED ORDER — HYDROCODONE-ACETAMINOPHEN 5-325 MG PO TABS: 1.0000 | ORAL_TABLET | Freq: Three times a day (TID) | ORAL | 0 refills | Status: DC | PRN

## 2016-03-08 NOTE — Telephone Encounter (Signed)
Requesting a 30 day supply, Do you want to dispense # 90, Sixty tabs was written 4 weeks ago

## 2016-03-13 ENCOUNTER — Ambulatory Visit
Admission: RE | Admit: 2016-03-13 | Discharge: 2016-03-13 | Disposition: A | Discharge status: Home / Self Care | Payer: Medicare Other | Source: Ambulatory Visit | Attending: Internal Medicine | Admitting: Internal Medicine

## 2016-03-13 DIAGNOSIS — G620 Drug-induced polyneuropathy: Secondary | ICD-10-CM

## 2016-03-13 DIAGNOSIS — J984 Other disorders of lung: Secondary | ICD-10-CM | POA: Insufficient documentation

## 2016-03-13 DIAGNOSIS — T451X5A Adverse effect of antineoplastic and immunosuppressive drugs, initial encounter: Secondary | ICD-10-CM | POA: Insufficient documentation

## 2016-03-13 DIAGNOSIS — C3491 Malignant neoplasm of unspecified part of right bronchus or lung: Secondary | ICD-10-CM | POA: Insufficient documentation

## 2016-03-13 DIAGNOSIS — C3490 Malignant neoplasm of unspecified part of unspecified bronchus or lung: Secondary | ICD-10-CM | POA: Diagnosis not present

## 2016-03-13 DIAGNOSIS — C3411 Malignant neoplasm of upper lobe, right bronchus or lung: Secondary | ICD-10-CM

## 2016-03-13 MED ORDER — IOPAMIDOL (ISOVUE-300) INJECTION 61%
75.0000 mL | Freq: Once | INTRAVENOUS | Status: AC | PRN
  Administered 2016-03-13: 75 mL via INTRAVENOUS

## 2016-03-17 DIAGNOSIS — J449 Chronic obstructive pulmonary disease, unspecified: Secondary | ICD-10-CM | POA: Diagnosis not present

## 2016-03-19 ENCOUNTER — Ambulatory Visit: Payer: Medicare Other | Admitting: Internal Medicine

## 2016-03-19 ENCOUNTER — Other Ambulatory Visit: Payer: Medicare Other

## 2016-03-21 ENCOUNTER — Other Ambulatory Visit: Payer: Self-pay | Admitting: Family Medicine

## 2016-03-22 ENCOUNTER — Inpatient Hospital Stay: Payer: Medicare Other | Attending: Internal Medicine | Admitting: Internal Medicine

## 2016-03-22 ENCOUNTER — Inpatient Hospital Stay: Payer: Medicare Other

## 2016-03-22 VITALS — BP 155/72 | HR 72 | Temp 97.6°F | Resp 20 | Ht 68.0 in | Wt 224.0 lb

## 2016-03-22 DIAGNOSIS — Z79899 Other long term (current) drug therapy: Secondary | ICD-10-CM | POA: Diagnosis not present

## 2016-03-22 DIAGNOSIS — J449 Chronic obstructive pulmonary disease, unspecified: Secondary | ICD-10-CM | POA: Insufficient documentation

## 2016-03-22 DIAGNOSIS — Z8052 Family history of malignant neoplasm of bladder: Secondary | ICD-10-CM | POA: Diagnosis not present

## 2016-03-22 DIAGNOSIS — N400 Enlarged prostate without lower urinary tract symptoms: Secondary | ICD-10-CM | POA: Insufficient documentation

## 2016-03-22 DIAGNOSIS — K219 Gastro-esophageal reflux disease without esophagitis: Secondary | ICD-10-CM | POA: Insufficient documentation

## 2016-03-22 DIAGNOSIS — M545 Low back pain: Secondary | ICD-10-CM | POA: Diagnosis not present

## 2016-03-22 DIAGNOSIS — I10 Essential (primary) hypertension: Secondary | ICD-10-CM | POA: Insufficient documentation

## 2016-03-22 DIAGNOSIS — T451X5A Adverse effect of antineoplastic and immunosuppressive drugs, initial encounter: Secondary | ICD-10-CM

## 2016-03-22 DIAGNOSIS — E785 Hyperlipidemia, unspecified: Secondary | ICD-10-CM | POA: Insufficient documentation

## 2016-03-22 DIAGNOSIS — G250 Essential tremor: Secondary | ICD-10-CM | POA: Diagnosis not present

## 2016-03-22 DIAGNOSIS — G620 Drug-induced polyneuropathy: Secondary | ICD-10-CM

## 2016-03-22 DIAGNOSIS — F1011 Alcohol abuse, in remission: Secondary | ICD-10-CM

## 2016-03-22 DIAGNOSIS — I499 Cardiac arrhythmia, unspecified: Secondary | ICD-10-CM | POA: Diagnosis not present

## 2016-03-22 DIAGNOSIS — G8929 Other chronic pain: Secondary | ICD-10-CM | POA: Diagnosis not present

## 2016-03-22 DIAGNOSIS — Z9221 Personal history of antineoplastic chemotherapy: Secondary | ICD-10-CM | POA: Diagnosis not present

## 2016-03-22 DIAGNOSIS — M1990 Unspecified osteoarthritis, unspecified site: Secondary | ICD-10-CM | POA: Diagnosis not present

## 2016-03-22 DIAGNOSIS — F17210 Nicotine dependence, cigarettes, uncomplicated: Secondary | ICD-10-CM | POA: Diagnosis not present

## 2016-03-22 DIAGNOSIS — Z86010 Personal history of colonic polyps: Secondary | ICD-10-CM | POA: Diagnosis not present

## 2016-03-22 DIAGNOSIS — G629 Polyneuropathy, unspecified: Secondary | ICD-10-CM | POA: Diagnosis not present

## 2016-03-22 DIAGNOSIS — C3491 Malignant neoplasm of unspecified part of right bronchus or lung: Secondary | ICD-10-CM

## 2016-03-22 DIAGNOSIS — C3411 Malignant neoplasm of upper lobe, right bronchus or lung: Secondary | ICD-10-CM | POA: Diagnosis not present

## 2016-03-22 DIAGNOSIS — Z8049 Family history of malignant neoplasm of other genital organs: Secondary | ICD-10-CM | POA: Diagnosis not present

## 2016-03-22 DIAGNOSIS — Z923 Personal history of irradiation: Secondary | ICD-10-CM | POA: Diagnosis not present

## 2016-03-22 DIAGNOSIS — Z8551 Personal history of malignant neoplasm of bladder: Secondary | ICD-10-CM | POA: Insufficient documentation

## 2016-03-22 LAB — COMPREHENSIVE METABOLIC PANEL
ALBUMIN: 3.8 g/dL (ref 3.5–5.0)
ALT: 67 U/L — ABNORMAL HIGH (ref 17–63)
ANION GAP: 10 (ref 5–15)
AST: 55 U/L — ABNORMAL HIGH (ref 15–41)
Alkaline Phosphatase: 95 U/L (ref 38–126)
BUN: 18 mg/dL (ref 6–20)
CHLORIDE: 103 mmol/L (ref 101–111)
CO2: 25 mmol/L (ref 22–32)
Calcium: 8.7 mg/dL — ABNORMAL LOW (ref 8.9–10.3)
Creatinine, Ser: 0.89 mg/dL (ref 0.61–1.24)
GFR calc Af Amer: >60 mL/min (ref >60)
GFR calc non Af Amer: >60 mL/min (ref >60)
GLUCOSE: 207 mg/dL — AB (ref 65–99)
POTASSIUM: 4.1 mmol/L (ref 3.5–5.1)
SODIUM: 138 mmol/L (ref 135–145)
Total Bilirubin: 0.6 mg/dL (ref 0.3–1.2)
Total Protein: 7.7 g/dL (ref 6.5–8.1)

## 2016-03-22 LAB — CBC WITH DIFFERENTIAL/PLATELET
BASOS PCT: 1 %
Basophils Absolute: 0 10*3/uL (ref 0–0.1)
EOS ABS: 0.2 10*3/uL (ref 0–0.7)
Eosinophils Relative: 3 %
HCT: 37.4 % — ABNORMAL LOW (ref 40.0–52.0)
Hemoglobin: 12.7 g/dL — ABNORMAL LOW (ref 13.0–18.0)
Lymphocytes Relative: 21 %
Lymphs Abs: 1.5 10*3/uL (ref 1.0–3.6)
MCH: 31 pg (ref 26.0–34.0)
MCHC: 33.9 g/dL (ref 32.0–36.0)
MCV: 91.4 fL (ref 80.0–100.0)
MONO ABS: 0.4 10*3/uL (ref 0.2–1.0)
MONOS PCT: 6 %
NEUTROS PCT: 69 %
Neutro Abs: 4.8 10*3/uL (ref 1.4–6.5)
Platelets: 255 10*3/uL (ref 150–440)
RBC: 4.1 MIL/uL — ABNORMAL LOW (ref 4.40–5.90)
RDW: 14.7 % — AB (ref 11.5–14.5)
WBC: 6.8 10*3/uL (ref 3.8–10.6)

## 2016-03-22 NOTE — Assessment & Plan Note (Signed)
#   Right UL POORLY DIFF CA- T3 N0  Currently status post radiation [finished feb 2017];  Finished adjuvant chemotherapy [end of April 2017; OCT 2017- CT scan shows continued partial response.   # Bil LE swelling/weight gain-Improved; Taking Lasix 40 mg once a day/ along with potassium. No changes.   # Peripheral neuropathy- on hydrocodone q 8 h [needs 1 month refils]; Neurontin 300 BID; awaiting appt in Nov 2017.   # Will have port taken out/ Refer to Dr.Oaks.   # Follow up in 4 months/Ct scan prior.

## 2016-03-22 NOTE — Progress Notes (Signed)
Patient notes improvement in neuropathy symptoms. Now able to button up his shirt without difficulty and improvement with ADLs. He is able to drive.

## 2016-03-22 NOTE — Progress Notes (Signed)
[place] NOTE  Patient Care Team: [name], MD as PCP - General (Family Medicine)  CHIEF COMPLAINTS/PURPOSE OF CONSULTATION:   Oncology History    # OCT 2016- RUL POORLY DIFF CA [clinically non-small cell] ; STAGE IIB [T3-2 separate nodules in RUL; N0] [s/p CT guided bx]- s/p RT [finished mid jan 2017]; FEB 2017-CT scan- regression of RUL nodules;   # March 2017- Start carbo-taxol q 3 w x4; June 2017- CT- Improvement of RUL nodule/ Stable RUL nodules. OCT 2017- PR   # OCT 2016- 2 sub cm nodules in Right LL- Feb CT 2017- STABLE; June 2017- resolved  # May- 2017- G2 PN  # OCT 2016-NON-invasive bladder urothelial cancer; high grade [Dr. [name]; s/p TURBT [Feb 2017- NEG]; declined BCG  # Pneumothorax [oct 2016 s/p CT Bx];  # MOLECULAR STUDIES- PDL-1 by IHC/keytruda- 11-20%.        Malignant neoplasm of lung ([place])   04/01/2015 Initial Diagnosis    Malignant neoplasm of lung (HCC)       Malignant neoplasm of right upper lobe of lung ([place])   12/20/2015 Initial Diagnosis    Malignant neoplasm of right upper lobe of lung (HCC)         HISTORY OF PRESENTING ILLNESS:  Joseph Graham 75 y.o. male with a history of long-standing smoking/COPD-  Non-small cell/ poorly differentiated malignancy of the right upper lobe  T3 N0 status post radiation; followed by adjuvant chemotherapy with [place] Taxol 4 cycles; Is here for follow-up of his bilateral extremity/ Neuropathy; and also to review the results of his restaging CAT scan.  Patient is swelling is improved. His neuropathy has not completely resolved however overall improved. He is awaiting neurology evaluation next week. He continues to be on Neurontin and hydrocodone for pain. Is also taking Lasix as needed for leg swelling.  He is very much interested in having his port taken out.   ROS: A complete 10 point review of system is done which is negative for mentioned above in history of present  illness.  MEDICAL HISTORY:  Past Medical History:  Diagnosis Date  . Alcohol abuse, in remission    remote  . Arthritis    BACK AND NECK  . Benign essential tremor    improved on metoprolol and gabapentin  . BPH (benign prostatic hypertrophy) 12/30/2012  . Cardiac arrhythmia 03/2010   h/o a flutter and CM, s/p ablation, normal stress test  . Chronic low back pain    MRI 2004, multilevel DDD  . Colon polyps    next colonoscopy due around 2018  . Complication of anesthesia    DID NOT GET COMPLETELY NUMB WITH TONSILLECTOMY  . COPD (chronic obstructive pulmonary disease) ([place])   . Dyslipidemia    mild off meds  . GERD (gastroesophageal reflux disease)   . HTN (hypertension)   . Multiple pulmonary nodules 02/2015  . Personal history of tobacco use, presenting hazards to health 02/18/2015   quit 06/2011, restarted 2014  . Primary cancer of bladder ([place]) 2016   Budzyn  . Primary lung cancer ([place]) 2016  . Shortness of breath dyspnea    OCC WITH EXERTION    SURGICAL HISTORY: Past Surgical History:  Procedure Laterality Date  . A FLUTTER ABLATION  03/2010  . carotid US  03/2010   WNL  . CATARACT EXTRACTION    . COLONOSCOPY  09/2011   polyps, rpt due 5 yrs, mild diverticulosis [name])  . CYSTOSCOPY W/ RETROGRADES Bilateral  04/06/2015   Procedure: CYSTOSCOPY WITH RETROGRADE PYELOGRAM;  Surgeon: [name], MD;  Location: [facility] ORS;  Service: Urology;  Laterality: Bilateral;  . ELECTROMAGNETIC NAVIGATION BROCHOSCOPY N/A 03/02/2015   Procedure: ELECTROMAGNETIC NAVIGATION BRONCHOSCOPY;  Surgeon: [name], MD;  Location: [facility] ORS;  Service: Cardiopulmonary;  Laterality: N/A;  . [place]  . PORTACATH PLACEMENT N/A 08/10/2015   Procedure: INSERTION PORT-A-CATH;  Surgeon: [name], MD;  Location: [facility] ORS;  Service: General;  Laterality: N/A;  . [place]  . TRANSURETHRAL RESECTION OF BLADDER TUMOR N/A 04/06/2015   Procedure: TRANSURETHRAL RESECTION  OF BLADDER TUMOR (TURBT) right ureteral stent placement;  Surgeon: [name], MD;  Location: [facility] ORS;  Service: Urology;  Laterality: N/A;  . TRANSURETHRAL RESECTION OF BLADDER TUMOR N/A 07/13/2015   Procedure: TRANSURETHRAL RESECTION OF BLADDER TUMOR (TURBT);  Surgeon: [name], MD;  Location: [facility] OR[facility];  Service: Urology;  Laterality: N/A;  . urinary stent removal  04/14/15    SOCIAL HISTORY: Patient is retired from Press photographer. He lives in [place]. Social History   Social History  . Marital status: Married    Spouse name: N/A  . Number of children: N/A  . Years of education: N/A   Occupational History  . Not on file.   Social History Main Topics  . Smoking status: Former Smoker    Packs/day: 1.50    Years: 61.00    Types: Cigarettes    Quit date: 03/08/2015  . Smokeless tobacco: Never Used     Comment: cut back 0.5 cigarettes--stopped 03/07/15; now now chantix  . Alcohol use No     Comment: has not drank in 37 years  . Drug use: No  . Sexual activity: Not on file   Other Topics Concern  . Not on file   Social History Narrative   Caffeine: 1 cup decaf/day   Lives with wife   Occupation: retired, worked Press photographer   Activity: gym 3x/wk, Immunologist, hobbies (paint, building things)   Diet: healthy, good water, fruits/vegetables daily, red meat 1x/wk    FAMILY HISTORY: Family History  Problem Relation Age of Onset  . Cervical cancer Mother 15  . Hypertension Father   . Tremor Father     and several uncles/aunts (not parkinson's)  . Stroke Brother 41  . Bladder Cancer Brother 13  . Prostate cancer Neg Hx   . Kidney cancer Neg Hx     ALLERGIES:  has No Known Allergies.  MEDICATIONS:  Current Outpatient Prescriptions  Medication Sig Dispense Refill  . amoxicillin (AMOXIL) 500 MG capsule Take 500 mg by mouth 3 (three) times daily.    . furosemide (LASIX) 40 MG tablet Take 1 tablet (40 mg total) by mouth daily. 30 tablet 2  . gabapentin (NEURONTIN) 300  MG capsule Take 2 capsules (600 mg total) by mouth 3 (three) times daily. (Patient taking differently: Take 900 mg by mouth 3 (three) times daily. ) 180 capsule 1  . HYDROcodone-acetaminophen (NORCO) 5-325 MG tablet Take 1 tablet by mouth every 8 (eight) hours as needed for moderate pain. 90 tablet 0  . lidocaine-prilocaine (EMLA) cream Apply 1 application topically as needed. To port a cath site 30 g 6  . lisinopril (PRINIVIL,ZESTRIL) 10 MG tablet TAKE 1 TABLET (10 MG TOTAL) BY MOUTH DAILY. 90 tablet 0  . Magnesium Cl-Calcium Carbonate (SLOW-MAG PO) Take 1 tablet by mouth daily.    . metoprolol tartrate (LOPRESSOR) 25 MG tablet TAKE 0.5 TABLETS (12.5 MG TOTAL)  BY MOUTH 2 (TWO) TIMES DAILY. 90 tablet 0  . Multiple Vitamins-Minerals (MULTIVITAMIN WITH MINERALS) tablet Take 1 tablet by mouth daily.    . potassium chloride SA (K-DUR,KLOR-CON) 20 MEQ tablet Take 1 tablet (20 mEq total) by mouth 2 (two) times daily. X 3 weeks 40 tablet 2  . albuterol (PROVENTIL HFA;VENTOLIN HFA) 108 (90 Base) MCG/ACT inhaler Inhale 2 puffs into the lungs every 6 (six) hours as needed for wheezing or shortness of breath. (Patient not taking: Reported on 03/22/2016) 1 Inhaler 2  . fluticasone furoate-vilanterol (BREO ELLIPTA) 100-25 MCG/INH AEPB Inhale 1 puff into the lungs daily. (Patient not taking: Reported on 03/22/2016) 60 each 5   No current facility-administered medications for this visit.   Marland Kitchen  PHYSICAL EXAMINATION: ECOG PERFORMANCE STATUS: 0 - Asymptomatic  Vitals:   03/22/16 1100  BP: (!) 155/72  Pulse: 72  Resp: 20  Temp: 97.6 F (36.4 C)   Filed Weights   03/22/16 1100  Weight: 224 lb (101.6 kg)  Pulse ox 94% on room air.  GENERAL:alert, no distress and comfortable. He is Accompanied by his wife. SKIN: skin color, texture, turgor are normal, no rashes or significant lesions EYES: normal, conjunctiva are pink and non-injected, sclera clear OROPHARYNX:no exudate, no erythema and lips, buccal  mucosa, and tongue normal  NECK: supple, thyroid normal size, non-tender, without nodularity LYMPH:  no palpable lymphadenopathy in the cervical, axillary or inguinal LUNGS: clear to auscultation and percussion with normal breathing effort HEART: regular rate & rhythm and no murmurs and bilateral 2+lower extremity edema ABDOMEN:abdomen soft, non-tender and normal bowel sounds Musculoskeletal:no cyanosis of digits and no clubbing  PSYCH: alert & oriented x 3 with fluent speech NEURO: no focal motor/sensory deficits  LABORATORY DATA:  I have reviewed the data as listed Lab Results  Component Value Date   WBC 6.8 03/22/2016   HGB 12.7 (L) 03/22/2016   HCT 37.4 (L) 03/22/2016   MCV 91.4 03/22/2016   PLT 255 03/22/2016    Recent Labs  10/13/15 0840  12/05/15 1050  01/20/16 1001 02/17/16 0851 03/22/16 1010  NA 139  < > 138  < > 138 136 138  K 3.8  < > 3.9  < > 3.8 3.8 4.1  CL 105  < > 104  < > 104 103 103  CO2 26  < > 28  < > '27 25 25  ' GLUCOSE 180*  < > 140*  < > 189* 148* 207*  BUN 19  < > 24*  < > 21* 21* 18  CREATININE 0.76  < > 0.87  < > 0.97 0.94 0.89  CALCIUM 8.7*  < > 8.8*  < > 8.9 8.7* 8.7*  GFRNONAA >60  < > >60  < > >60 >60 >60  GFRAA >60  < > >60  < > >60 >60 >60  PROT 7.6  --  7.7  --   --   --  7.7  ALBUMIN 3.8  --  3.9  --   --   --  3.8  AST 31  --  39  --   --   --  55*  ALT 35  --  52  --   --   --  67*  ALKPHOS 121  --  105  --   --   --  95  BILITOT 0.5  --  0.4  --   --   --  0.6  < > = values in this interval not displayed.  RADIOGRAPHIC STUDIES:  ASSESSMENT & PLAN:   Malignant neoplasm of right upper lobe of lung ([place]) # Right UL POORLY DIFF CA- T3 N0  Currently status post radiation [finished feb 2017];  Finished adjuvant chemotherapy [end of April 2017; OCT 2017- CT scan shows continued partial response.   # Bil LE swelling/weight gain-Improved; Taking Lasix 40 mg once a day/ along with potassium. No changes.   # Peripheral neuropathy- on  hydrocodone q 8 h [needs 1 month refils]; Neurontin 300 BID; awaiting appt in Nov 2017.   # Will have port taken out/ Refer to Dr.Oaks.   # Follow up in 4 months/Ct scan prior.      Cammie Sickle, MD 03/25/2016 12:16 PM

## 2016-03-23 ENCOUNTER — Other Ambulatory Visit: Payer: Self-pay

## 2016-03-23 DIAGNOSIS — C3490 Malignant neoplasm of unspecified part of unspecified bronchus or lung: Secondary | ICD-10-CM

## 2016-03-28 DIAGNOSIS — T451X5A Adverse effect of antineoplastic and immunosuppressive drugs, initial encounter: Secondary | ICD-10-CM | POA: Diagnosis not present

## 2016-03-28 DIAGNOSIS — E538 Deficiency of other specified B group vitamins: Secondary | ICD-10-CM | POA: Diagnosis not present

## 2016-03-28 DIAGNOSIS — G620 Drug-induced polyneuropathy: Secondary | ICD-10-CM | POA: Diagnosis not present

## 2016-03-28 DIAGNOSIS — G250 Essential tremor: Secondary | ICD-10-CM | POA: Diagnosis not present

## 2016-03-30 ENCOUNTER — Ambulatory Visit (INDEPENDENT_AMBULATORY_CARE_PROVIDER_SITE_OTHER): Payer: Medicare Other | Admitting: Cardiothoracic Surgery

## 2016-03-30 ENCOUNTER — Other Ambulatory Visit: Payer: Self-pay

## 2016-03-30 ENCOUNTER — Encounter: Payer: Self-pay | Admitting: Cardiothoracic Surgery

## 2016-03-30 ENCOUNTER — Telehealth: Payer: Self-pay | Admitting: Cardiothoracic Surgery

## 2016-03-30 VITALS — BP 154/69 | HR 65 | Temp 98.0°F | Resp 18 | Ht 68.0 in | Wt 224.8 lb

## 2016-03-30 DIAGNOSIS — C3401 Malignant neoplasm of right main bronchus: Secondary | ICD-10-CM

## 2016-03-30 NOTE — Telephone Encounter (Signed)
Pt advised of pre op date/time and sx date. Sx: 04/03/16 with Dr Rolley Sims Removal.  Pre op: 04/02/16 between 1-5:00pm--[facility].   Patient made aware to call [phone], between 1-3:00pm the day before surgery, to find out what time to arrive.

## 2016-03-30 NOTE — Patient Instructions (Addendum)
We have scheduled your Port Removal scheduled for 04/03/16. Please refer to your [facility] sheet for surgery information. Please call our office if you have any questions or concerns.

## 2016-03-30 NOTE — Progress Notes (Signed)
  Patient ID: Joseph Graham, male   DOB: 06-23-1940, 75 y.o.   MRN: [id number]  HISTORY: This gentleman comes in today following his therapy for metastatic bladder cancer to his lung. He has completed all this therapy and has declined any further chemotherapy should the need arise in the future. He would like to have his Port-A-Cath removed. He's had no problems with his Port-A-Cath. He's had no fevers or chills associated with the Port-A-Cath.   Vitals:   03/30/16 0950  BP: (!) 154/69  Pulse: 65  Resp: 18  Temp: 98 F (36.7 C)     EXAM:    Resp: Lungs are clear bilaterally.  No respiratory distress, normal effort. Heart:  Regular without murmurs Abd:  Abdomen is soft, non distended and non tender. No masses are palpable.  There is no rebound and no guarding.  Neurological: Alert and oriented to person, place, and time. Coordination normal.  Skin: Skin is warm and dry. No rash noted. No diaphoretic. No erythema. No pallor.  Psychiatric: Normal mood and affect. Normal behavior. Judgment and thought content normal.    ASSESSMENT: The Port-A-Cath is present in the right chest wall. There is no erythema surrounding it. I did review with him the indications and risks of Port-A-Cath removal. The main risk of course is a risk of bleeding that may occur or the need for future intravenous access. He's declined any future chemotherapy even if it were offered. Therefore we will go ahead and remove his Port-A-Cath.   PLAN:   We will set the patient up for surgery next week. All questions were answered.    [name], MD

## 2016-04-02 ENCOUNTER — Encounter
Admission: RE | Admit: 2016-04-02 | Discharge: 2016-04-02 | Disposition: A | Discharge status: Home / Self Care | Payer: Medicare Other | Source: Ambulatory Visit | Attending: Cardiothoracic Surgery | Admitting: Cardiothoracic Surgery

## 2016-04-02 HISTORY — DX: Atherosclerotic heart disease of native coronary artery without angina pectoris: I25.10

## 2016-04-02 NOTE — Patient Instructions (Signed)
  Your procedure is scheduled on: 04-03-16 Report to Same Day Surgery 2nd floor medical mall @ 8:30 PER PT   Remember: Instructions that are not followed completely may result in serious medical risk, up to and including death, or upon the discretion of your surgeon and anesthesiologist your surgery may need to be rescheduled.    _x___ 1. Do not eat food or drink liquids after midnight. No gum chewing or hard candies.     __x__ 2. No Alcohol for 24 hours before or after surgery.   __x__3. No Smoking for 24 prior to surgery.   ____  4. Bring all medications with you on the day of surgery if instructed.    __x__ 5. Notify your doctor if there is any change in your medical condition     (cold, fever, infections).     Do not wear jewelry, make-up, hairpins, clips or nail polish.  Do not wear lotions, powders, or perfumes. You may wear deodorant.  Do not shave 48 hours prior to surgery. Men may shave face and neck.  Do not bring valuables to the hospital.    [facility] is not responsible for any belongings or valuables.               Contacts, dentures or bridgework may not be worn into surgery.  Leave your suitcase in the car. After surgery it may be brought to your room.  For patients admitted to the hospital, discharge time is determined by your treatment team.   Patients discharged the day of surgery will not be allowed to drive home.    Please read over the following fact sheets that you were given:   [facility] Preparing for Surgery and or MRSA Information   _x___ Take these medicines the morning of surgery with A SIP OF WATER:    1. METOPROLOL  2. GABAPENTIN  3.  4.  5.  6.  ____Fleets enema or Magnesium Citrate as directed.   ____ Use CHG Soap or sage wipes as directed on instruction sheet   _X___ Use inhalers on the day of surgery and bring to hospital day of [place]  ____ Stop metformin 2 days prior to  surgery    ____ Take 1/2 of usual insulin dose the night before surgery and none on the morning of  surgery.   ____ Stop aspirin or coumadin, or plavix  x__ Stop Anti-inflammatories such as Advil, Aleve, Ibuprofen, Motrin, Naproxen,          Naprosyn, Goodies powders or aspirin products. Ok to take Tylenol.   ____ Stop supplements until after surgery.    ____ Bring C-Pap to the hospital.

## 2016-04-03 ENCOUNTER — Other Ambulatory Visit: Payer: Self-pay

## 2016-04-03 ENCOUNTER — Ambulatory Visit: Payer: Medicare Other | Admitting: Anesthesiology

## 2016-04-03 ENCOUNTER — Ambulatory Visit
Admission: RE | Admit: 2016-04-03 | Discharge: 2016-04-03 | Disposition: A | Discharge status: Home / Self Care | Payer: Medicare Other | Source: Ambulatory Visit | Attending: Cardiothoracic Surgery | Admitting: Cardiothoracic Surgery

## 2016-04-03 ENCOUNTER — Encounter
Admission: RE | Disposition: A | Discharge status: Home / Self Care | Payer: Self-pay | Source: Ambulatory Visit | Attending: Cardiothoracic Surgery

## 2016-04-03 ENCOUNTER — Encounter: Payer: Self-pay | Admitting: *Deleted

## 2016-04-03 DIAGNOSIS — C679 Malignant neoplasm of bladder, unspecified: Secondary | ICD-10-CM | POA: Diagnosis not present

## 2016-04-03 DIAGNOSIS — C3401 Malignant neoplasm of right main bronchus: Secondary | ICD-10-CM

## 2016-04-03 DIAGNOSIS — Z87891 Personal history of nicotine dependence: Secondary | ICD-10-CM | POA: Insufficient documentation

## 2016-04-03 DIAGNOSIS — M545 Low back pain: Secondary | ICD-10-CM | POA: Diagnosis not present

## 2016-04-03 DIAGNOSIS — E785 Hyperlipidemia, unspecified: Secondary | ICD-10-CM | POA: Diagnosis not present

## 2016-04-03 DIAGNOSIS — M1990 Unspecified osteoarthritis, unspecified site: Secondary | ICD-10-CM | POA: Insufficient documentation

## 2016-04-03 DIAGNOSIS — C3411 Malignant neoplasm of upper lobe, right bronchus or lung: Secondary | ICD-10-CM

## 2016-04-03 DIAGNOSIS — Z79899 Other long term (current) drug therapy: Secondary | ICD-10-CM | POA: Diagnosis not present

## 2016-04-03 DIAGNOSIS — I10 Essential (primary) hypertension: Secondary | ICD-10-CM | POA: Diagnosis not present

## 2016-04-03 DIAGNOSIS — I2510 Atherosclerotic heart disease of native coronary artery without angina pectoris: Secondary | ICD-10-CM | POA: Diagnosis not present

## 2016-04-03 DIAGNOSIS — G250 Essential tremor: Secondary | ICD-10-CM | POA: Diagnosis not present

## 2016-04-03 DIAGNOSIS — Z8049 Family history of malignant neoplasm of other genital organs: Secondary | ICD-10-CM | POA: Diagnosis not present

## 2016-04-03 DIAGNOSIS — Z8551 Personal history of malignant neoplasm of bladder: Secondary | ICD-10-CM | POA: Diagnosis not present

## 2016-04-03 DIAGNOSIS — J449 Chronic obstructive pulmonary disease, unspecified: Secondary | ICD-10-CM | POA: Insufficient documentation

## 2016-04-03 DIAGNOSIS — Z923 Personal history of irradiation: Secondary | ICD-10-CM | POA: Diagnosis not present

## 2016-04-03 DIAGNOSIS — C3490 Malignant neoplasm of unspecified part of unspecified bronchus or lung: Secondary | ICD-10-CM | POA: Diagnosis not present

## 2016-04-03 DIAGNOSIS — Z452 Encounter for adjustment and management of vascular access device: Secondary | ICD-10-CM | POA: Diagnosis not present

## 2016-04-03 DIAGNOSIS — Z8052 Family history of malignant neoplasm of bladder: Secondary | ICD-10-CM | POA: Diagnosis not present

## 2016-04-03 DIAGNOSIS — K219 Gastro-esophageal reflux disease without esophagitis: Secondary | ICD-10-CM | POA: Diagnosis not present

## 2016-04-03 DIAGNOSIS — Z9221 Personal history of antineoplastic chemotherapy: Secondary | ICD-10-CM | POA: Diagnosis not present

## 2016-04-03 DIAGNOSIS — G8929 Other chronic pain: Secondary | ICD-10-CM | POA: Diagnosis not present

## 2016-04-03 DIAGNOSIS — N400 Enlarged prostate without lower urinary tract symptoms: Secondary | ICD-10-CM | POA: Diagnosis not present

## 2016-04-03 DIAGNOSIS — I499 Cardiac arrhythmia, unspecified: Secondary | ICD-10-CM | POA: Diagnosis not present

## 2016-04-03 DIAGNOSIS — Z86010 Personal history of colonic polyps: Secondary | ICD-10-CM | POA: Diagnosis not present

## 2016-04-03 DIAGNOSIS — G629 Polyneuropathy, unspecified: Secondary | ICD-10-CM | POA: Diagnosis not present

## 2016-04-03 HISTORY — PX: PORT-A-CATH REMOVAL: SHX5289

## 2016-04-03 HISTORY — DX: Myoneural disorder, unspecified: G70.9

## 2016-04-03 SURGERY — REMOVAL PORT-A-CATH
Anesthesia: General | Site: Chest | Laterality: Right | Wound class: Clean

## 2016-04-03 MED ORDER — MEPERIDINE HCL 25 MG/ML IJ SOLN: 6.2500 mg | INTRAMUSCULAR | Status: DC | PRN

## 2016-04-03 MED ORDER — PROPOFOL 500 MG/50ML IV EMUL
INTRAVENOUS | Status: DC | PRN
  Administered 2016-04-03: 50 ug/kg/min via INTRAVENOUS

## 2016-04-03 MED ORDER — LIDOCAINE HCL (CARDIAC) 20 MG/ML IV SOLN
INTRAVENOUS | Status: DC | PRN
  Administered 2016-04-03: 40 mg via INTRAVENOUS

## 2016-04-03 MED ORDER — LIDOCAINE HCL (PF) 1 % IJ SOLN
INTRAMUSCULAR | Status: AC
  Filled 2016-04-03: qty 30

## 2016-04-03 MED ORDER — LACTATED RINGERS IV SOLN
INTRAVENOUS | Status: DC
  Administered 2016-04-03: 50 mL/h via INTRAVENOUS
  Administered 2016-04-03: 10:00:00 via INTRAVENOUS

## 2016-04-03 MED ORDER — FAMOTIDINE 20 MG PO TABS
ORAL_TABLET | ORAL | Status: AC
  Administered 2016-04-03: 20 mg via ORAL
  Filled 2016-04-03: qty 1

## 2016-04-03 MED ORDER — PROMETHAZINE HCL 25 MG/ML IJ SOLN: 6.2500 mg | INTRAMUSCULAR | Status: DC | PRN

## 2016-04-03 MED ORDER — OXYCODONE HCL 5 MG PO TABS: 5.0000 mg | ORAL_TABLET | Freq: Once | ORAL | Status: DC | PRN

## 2016-04-03 MED ORDER — DEXTROSE 5 % IV SOLN
1.5000 g | INTRAVENOUS | Status: AC
  Administered 2016-04-03: 1.5 g via INTRAVENOUS
  Filled 2016-04-03: qty 1.5

## 2016-04-03 MED ORDER — OXYCODONE HCL 5 MG/5ML PO SOLN: 5.0000 mg | Freq: Once | ORAL | Status: DC | PRN

## 2016-04-03 MED ORDER — FENTANYL CITRATE (PF) 100 MCG/2ML IJ SOLN: 25.0000 ug | INTRAMUSCULAR | Status: DC | PRN

## 2016-04-03 MED ORDER — LIDOCAINE HCL (PF) 1 % IJ SOLN
INTRAMUSCULAR | Status: DC | PRN
  Administered 2016-04-03: 13 mL

## 2016-04-03 MED ORDER — FENTANYL CITRATE (PF) 100 MCG/2ML IJ SOLN
INTRAMUSCULAR | Status: DC | PRN
  Administered 2016-04-03: 50 ug via INTRAVENOUS

## 2016-04-03 MED ORDER — MIDAZOLAM HCL 2 MG/2ML IJ SOLN
INTRAMUSCULAR | Status: DC | PRN
Start: 2016-04-03 — End: 2016-04-03
  Administered 2016-04-03 (×2): 1 mg via INTRAVENOUS

## 2016-04-03 MED ORDER — PROPOFOL 10 MG/ML IV BOLUS
INTRAVENOUS | Status: DC | PRN
  Administered 2016-04-03 (×4): 10 mg via INTRAVENOUS

## 2016-04-03 MED ORDER — FAMOTIDINE 20 MG PO TABS
20.0000 mg | ORAL_TABLET | Freq: Once | ORAL | Status: AC
  Administered 2016-04-03: 20 mg via ORAL

## 2016-04-03 SURGICAL SUPPLY — 25 items
CANISTER SUCT 1200ML W/VALVE (MISCELLANEOUS) ×2 IMPLANT
CHLORAPREP W/TINT 26ML (MISCELLANEOUS) ×4 IMPLANT
COVER LIGHT HANDLE STERIS (MISCELLANEOUS) ×4 IMPLANT
CRADLE LAMINECT ARM (MISCELLANEOUS) ×2 IMPLANT
DRAPE INCISE IOBAN 66X45 STRL (DRAPES) ×2 IMPLANT
DRESSING TELFA 4X3 1S ST N-ADH (GAUZE/BANDAGES/DRESSINGS) ×2 IMPLANT
DRSG TEGADERM 4X4.75 (GAUZE/BANDAGES/DRESSINGS) ×2 IMPLANT
ELECT REM PT RETURN 9FT ADLT (ELECTROSURGICAL) ×2
ELECTRODE REM PT RTRN 9FT ADLT (ELECTROSURGICAL) ×1 IMPLANT
GAUZE SPONGE 4X4 12PLY STRL (GAUZE/BANDAGES/DRESSINGS) ×2 IMPLANT
GLOVE SURG SYN 7.5  E (GLOVE) ×1
GLOVE SURG SYN 7.5 E (GLOVE) ×1 IMPLANT
GOWN STRL REUS W/ TWL LRG LVL3 (GOWN DISPOSABLE) ×2 IMPLANT
GOWN STRL REUS W/TWL LRG LVL3 (GOWN DISPOSABLE) ×2
KIT RM TURNOVER STRD PROC AR (KITS) ×2 IMPLANT
LABEL OR SOLS (LABEL) ×2 IMPLANT
PACK PORT-A-CATH (MISCELLANEOUS) ×2 IMPLANT
PAD TELFA 2X3 NADH STRL (GAUZE/BANDAGES/DRESSINGS) ×2 IMPLANT
STRIP CLOSURE SKIN 1/4X4 (GAUZE/BANDAGES/DRESSINGS) ×2 IMPLANT
SUT ETHILON 4-0 (SUTURE) ×1
SUT ETHILON 4-0 FS2 18XMFL BLK (SUTURE) ×1
SUT VIC AB 3-0 SH 27 (SUTURE) ×1
SUT VIC AB 3-0 SH 27X BRD (SUTURE) ×1 IMPLANT
SUT VIC AB 4-0 FS2 27 (SUTURE) ×2 IMPLANT
SUTURE ETHLN 4-0 FS2 18XMF BLK (SUTURE) ×1 IMPLANT

## 2016-04-03 NOTE — Discharge Instructions (Signed)

## 2016-04-03 NOTE — Interval H&P Note (Signed)
History and Physical Interval Note:  04/03/2016 9:02 AM  Joseph Graham  has presented today for surgery, with the diagnosis of MALIGNANT NEOPLASM OF RIGHT UPPER LOBE OF LUNG  The various methods of treatment have been discussed with the patient and family. After consideration of risks, benefits and other options for treatment, the patient has consented to  Procedure(s): REMOVAL PORT-A-CATH (N/A) as a surgical intervention .  The patient's history has been reviewed, patient examined, no change in status, stable for surgery.  I have reviewed the patient's chart and labs.  Questions were answered to the patient's satisfaction.     [name]

## 2016-04-03 NOTE — Anesthesia Preprocedure Evaluation (Signed)
Anesthesia Evaluation  Patient identified by MRN, date of birth, ID band Patient awake    Reviewed: Allergy & Precautions, NPO status , Patient's Chart, lab work & pertinent test results  History of Anesthesia Complications Negative for: history of anesthetic complications  Airway Mallampati: II  TM Distance: >3 FB Neck ROM: Full    Dental  (+) Poor Dentition   Pulmonary neg sleep apnea, COPD,  COPD inhaler, former smoker,    breath sounds clear to auscultation- rhonchi (-) wheezing      Cardiovascular Exercise Tolerance: Good hypertension, + CAD (CT scan showing mild coronary calcifications in the RCA, left main, LAD, left circumflex.)  (-) Past MI Dysrhythmias: hx of ablation.  Rhythm:Regular Rate:Normal - Systolic murmurs and - Diastolic murmurs Echo 3/47/42: - Left ventricle: The cavity size was normal. Systolic function was   normal. The estimated ejection fraction was in the range of 50%   to 55%. Wall motion was normal; there were no regional wall   motion abnormalities. - Aortic valve: Valve area (Vmax): 2.09 cm^2.   Neuro/Psych negative neurological ROS  negative psych ROS   GI/Hepatic Neg liver ROS, GERD  ,  Endo/Other  negative endocrine ROSneg diabetes  Renal/GU negative Renal ROS     Musculoskeletal  (+) Arthritis , Osteoarthritis,    Abdominal (+) + obese,   Peds  Hematology negative hematology ROS (+)   Anesthesia Other Findings Past Medical History: No date: Alcohol abuse, in remission     Comment: remote No date: Arthritis     Comment: BACK AND NECK No date: Benign essential tremor     Comment: improved on metoprolol and gabapentin 12/30/2012: BPH (benign prostatic hypertrophy) 03/2010: Cardiac arrhythmia     Comment: h/o a flutter and CM, s/p ablation, normal               stress test No date: Chronic low back pain     Comment: MRI 2004, multilevel DDD No date: Colon polyps     Comment:  next colonoscopy due around 5956 No date: Complication of anesthesia     Comment: DID NOT GET COMPLETELY NUMB WITH TONSILLECTOMY No date: COPD (chronic obstructive pulmonary disease) (* No date: Coronary artery disease No date: Dyslipidemia     Comment: mild off meds No date: GERD (gastroesophageal reflux disease) No date: HTN (hypertension) 02/2015: Multiple pulmonary nodules No date: Neuromuscular disorder ([place])     Comment: neuropathy from chemo....in feet 02/18/2015: Personal history of tobacco use, presenting ha*     Comment: quit 06/2011, restarted 2014 2016: Primary cancer of bladder ([place])     Comment: Budz[place] 2016: Primary lung cancer ([place]) No date: Shortness of breath dyspnea     Comment: OCC WITH EXERTION   Reproductive/Obstetrics                             Anesthesia Physical Anesthesia Plan  ASA: II  Anesthesia Plan: General   Post-op Pain Management:    Induction: Intravenous  Airway Management Planned: Natural Airway  Additional Equipment:   Intra-op Plan:   Post-operative Plan:   Informed Consent: I have reviewed the patients History and Physical, chart, labs and discussed the procedure including the risks, benefits and alternatives for the proposed anesthesia with the patient or authorized representative who has indicated his/her understanding and acceptance.   Dental advisory given  Plan Discussed with: CRNA and Anesthesiologist  Anesthesia Plan Comments:  Anesthesia Quick Evaluation  

## 2016-04-03 NOTE — H&P (View-Only) (Signed)
  Patient ID: Joseph Graham, male   DOB: Dec 15, 1940, 75 y.o.   MRN: [id number]  HISTORY: This gentleman comes in today following his therapy for metastatic bladder cancer to his lung. He has completed all this therapy and has declined any further chemotherapy should the need arise in the future. He would like to have his Port-A-Cath removed. He's had no problems with his Port-A-Cath. He's had no fevers or chills associated with the Port-A-Cath.   Vitals:   03/30/16 0950  BP: (!) 154/69  Pulse: 65  Resp: 18  Temp: 98 F (36.7 C)     EXAM:    Resp: Lungs are clear bilaterally.  No respiratory distress, normal effort. Heart:  Regular without murmurs Abd:  Abdomen is soft, non distended and non tender. No masses are palpable.  There is no rebound and no guarding.  Neurological: Alert and oriented to person, place, and time. Coordination normal.  Skin: Skin is warm and dry. No rash noted. No diaphoretic. No erythema. No pallor.  Psychiatric: Normal mood and affect. Normal behavior. Judgment and thought content normal.    ASSESSMENT: The Port-A-Cath is present in the right chest wall. There is no erythema surrounding it. I did review with him the indications and risks of Port-A-Cath removal. The main risk of course is a risk of bleeding that may occur or the need for future intravenous access. He's declined any future chemotherapy even if it were offered. Therefore we will go ahead and remove his Port-A-Cath.   PLAN:   We will set the patient up for surgery next week. All questions were answered.    [name], MD

## 2016-04-03 NOTE — Op Note (Signed)
  04/03/2016  11:02 AM  PATIENT:  Joseph Graham  75 y.o. male  PRE-OPERATIVE DIAGNOSIS:  Lung cancer with Port A Cath  POST-OPERATIVE DIAGNOSIS:  same  PROCEDURE:  Removal of Port-A-Cath  SURGEON:  Surgeon(s) and Role:    * [name], MD - Primary  ASSISTANTS: Paige Horcher  ANESTHESIA: Intravenous sedation plus local  INDICATIONS FOR PROCEDURE this is a 75 year old gentleman is now completed his chemotherapy for his lung cancer and desires have his Port-A-Cath removed. Although he has had no problems with his Port-A-Cath he has declined any further interventions for his presumed cured lung tumor.  DICTATION: Patient brought to the operating suite and placed in supine position. Intravenous sedation was given. The patient was prepped and draped in usual sterile fashion. 1% lidocaine was used as a local anesthetic. 10 cc of lidocaine were used to anesthetize the previous incision. The incision was opened. The soft tissues were dissected down to the level of the Port-A-Cath. The Port-A-Cath was grasped and pulled out of the venous system. The catheter was then removed from the anterior chest wall by cutting all 3 sutures which had secured the catheter.  The deeper tissues were then closed with interrupted 3-0 Vicryl. We then closed the subcutaneous tissues with a running 3-0 Vicryl and the skin with 4-0 nylon.   the patient tolerated procedure well was taken to the recovery room in stable condition.   [name], MD

## 2016-04-03 NOTE — Anesthesia Postprocedure Evaluation (Signed)
Anesthesia Post Note  Patient: Joseph Graham  Procedure(s) Performed: Procedure(s) (LRB): REMOVAL PORT-A-CATH (Right)  Patient location during evaluation: PACU Anesthesia Type: General Level of consciousness: awake and alert and oriented Pain management: pain level controlled Vital Signs Assessment: post-procedure vital signs reviewed and stable Respiratory status: spontaneous breathing, nonlabored ventilation and respiratory function stable Cardiovascular status: blood pressure returned to baseline and stable Postop Assessment: no signs of nausea or vomiting Anesthetic complications: no    Last Vitals:  Vitals:   04/03/16 1104 04/03/16 1134  BP:  (!) 128/58  Pulse:  63  Resp:  15  Temp: 36.3 C     Last Pain:  Vitals:   04/03/16 1134  TempSrc:   PainSc: 0-No pain                 Jameca Chumley

## 2016-04-03 NOTE — Anesthesia Procedure Notes (Signed)
Procedure Name: MAC Date/Time: 04/03/2016 9:57 AM Performed by: Allean Found Pre-anesthesia Checklist: Patient identified, Emergency Drugs available, Suction available, Patient being monitored and Timeout performed Oxygen Delivery Method: Nasal cannula

## 2016-04-03 NOTE — Transfer of Care (Signed)
Immediate Anesthesia Transfer of Care Note  Patient: Joseph Graham  Procedure(s) Performed: Procedure(s): REMOVAL PORT-A-CATH (Right)  Patient Location: PACU  Anesthesia Type:General  Level of Consciousness: awake  Airway & Oxygen Therapy: Patient Spontanous Breathing and Patient connected to nasal cannula oxygen  Post-op Assessment: Report given to RN and Post -op Vital signs reviewed and stable  Post vital signs: Reviewed and stable  Last Vitals:  Vitals:   04/03/16 0848 04/03/16 1104  BP: (!) 143/63   Pulse: 70   Resp: 18   Temp: 36.8 C (P) 36.3 C    Last Pain:  Vitals:   04/03/16 0848  TempSrc: Oral  PainSc: 4       Patients Stated Pain Goal: 1 (17/40/81 4481)  Complications: No apparent anesthesia complications

## 2016-04-04 ENCOUNTER — Encounter: Payer: Self-pay | Admitting: Cardiothoracic Surgery

## 2016-04-05 LAB — SURGICAL PATHOLOGY

## 2016-04-11 ENCOUNTER — Encounter: Payer: Self-pay | Admitting: Family Medicine

## 2016-04-13 ENCOUNTER — Encounter: Payer: Self-pay | Admitting: Cardiothoracic Surgery

## 2016-04-13 ENCOUNTER — Ambulatory Visit (INDEPENDENT_AMBULATORY_CARE_PROVIDER_SITE_OTHER): Payer: Medicare Other | Admitting: Cardiothoracic Surgery

## 2016-04-13 VITALS — BP 183/71 | HR 86 | Temp 98.6°F | Wt 222.0 lb

## 2016-04-13 DIAGNOSIS — C3411 Malignant neoplasm of upper lobe, right bronchus or lung: Secondary | ICD-10-CM

## 2016-04-13 NOTE — Patient Instructions (Signed)
Please give us a call if you have any questions or concerns. 

## 2016-04-13 NOTE — Progress Notes (Signed)
He returns today in follow-up. He has Port-A-Cath removed several days ago and the wounds are now completely healed. He has no problems. The wound is clean and dry. There is no erythema. There is no fluctuance. All the sutures were removed. No follow-up was made.

## 2016-04-15 ENCOUNTER — Encounter: Payer: Self-pay | Admitting: Family Medicine

## 2016-04-16 ENCOUNTER — Other Ambulatory Visit: Payer: Self-pay | Admitting: *Deleted

## 2016-04-16 DIAGNOSIS — C3491 Malignant neoplasm of unspecified part of right bronchus or lung: Secondary | ICD-10-CM

## 2016-04-16 DIAGNOSIS — G620 Drug-induced polyneuropathy: Secondary | ICD-10-CM

## 2016-04-16 DIAGNOSIS — C3411 Malignant neoplasm of upper lobe, right bronchus or lung: Secondary | ICD-10-CM

## 2016-04-16 DIAGNOSIS — T451X5A Adverse effect of antineoplastic and immunosuppressive drugs, initial encounter: Secondary | ICD-10-CM

## 2016-04-16 MED ORDER — HYDROCODONE-ACETAMINOPHEN 5-325 MG PO TABS: 1.0000 | ORAL_TABLET | Freq: Three times a day (TID) | ORAL | 0 refills | Status: DC | PRN

## 2016-04-17 DIAGNOSIS — J449 Chronic obstructive pulmonary disease, unspecified: Secondary | ICD-10-CM | POA: Diagnosis not present

## 2016-04-18 DIAGNOSIS — G5622 Lesion of ulnar nerve, left upper limb: Secondary | ICD-10-CM | POA: Diagnosis not present

## 2016-04-20 ENCOUNTER — Ambulatory Visit
Admission: RE | Admit: 2016-04-20 | Discharge: 2016-04-20 | Disposition: A | Discharge status: Home / Self Care | Payer: Medicare Other | Source: Ambulatory Visit | Attending: Radiation Oncology | Admitting: Radiation Oncology

## 2016-04-20 ENCOUNTER — Encounter: Payer: Self-pay | Admitting: Radiation Oncology

## 2016-04-20 VITALS — BP 125/55 | HR 81 | Temp 97.8°F | Wt 218.9 lb

## 2016-04-20 DIAGNOSIS — Z9221 Personal history of antineoplastic chemotherapy: Secondary | ICD-10-CM | POA: Insufficient documentation

## 2016-04-20 DIAGNOSIS — Z923 Personal history of irradiation: Secondary | ICD-10-CM | POA: Diagnosis not present

## 2016-04-20 DIAGNOSIS — G629 Polyneuropathy, unspecified: Secondary | ICD-10-CM | POA: Diagnosis not present

## 2016-04-20 DIAGNOSIS — Z87891 Personal history of nicotine dependence: Secondary | ICD-10-CM | POA: Diagnosis not present

## 2016-04-20 DIAGNOSIS — C3411 Malignant neoplasm of upper lobe, right bronchus or lung: Secondary | ICD-10-CM | POA: Insufficient documentation

## 2016-04-20 NOTE — Progress Notes (Signed)
Radiation Oncology Follow up Note  Name: Joseph Graham   Date:   04/20/2016 MRN:  [id number] DOB: Nov 17, 1940    This 75 y.o. male presents to the clinic today for 9 month follow-up status post radiation therapy to his right upper lobe for 2 separate areas of poorly differentiated non-small cell lung cancer  REFERRING PROVIDER: [name], MD  HPI: Patient is a 75 year old male now out 9 months having completed concurrent chemoradiation for 2 separate poorly differentiated non-small cell lung cancers of the right upper lobe. He has been treated with carboplatinum as well as Taxol after completion of radiation therapy with improvement of the right upper lobe nodules. He did develop significant peripheral neuropathy is on gabapentin. His most recent CT scan shows continued interval decrease in size of right apical lung lesions. No mediastinal hilar hilar adenopathy was noted. He specifically denies cough hemoptysis or chest tightness. Still has some problems with peripheral edema in his lower extremities and peripheral neuropathy.  COMPLICATIONS OF TREATMENT: none  FOLLOW UP COMPLIANCE: keeps appointments   PHYSICAL EXAM:  BP (!) 125/55   Pulse 81   Temp 97.8 F (36.6 C)   Wt 218 lb 14.7 oz (99.3 kg)   BMI 33.29 kg/m  A well-developed male in NAD. Does have +2 edema in his lower extremities. Well-developed well-nourished patient in NAD. HEENT reveals PERLA, EOMI, discs not visualized.  Oral cavity is clear. No oral mucosal lesions are identified. Neck is clear without evidence of cervical or supraclavicular adenopathy. Lungs are clear to A&P. Cardiac examination is essentially unremarkable with regular rate and rhythm without murmur rub or thrill. Abdomen is benign with no organomegaly or masses noted. Motor sensory and DTR levels are equal and symmetric in the upper and lower extremities. Cranial nerves II through XII are grossly intact. Proprioception is intact. No peripheral adenopathy  or edema is identified. No motor or sensory levels are noted. Crude visual fields are within normal range.  RADIOLOGY RESULTS: Most recent CT scans are reviewed and compatible with above-stated findings and compared to prior serial CT scans  PLAN: Present time patient is doing well with stable chest CT scan. I'm please was overall progress. He has a follow-up CT scan in February which I've asked to review when becomes available. Otherwise I've asked to see him back in 6 months for follow-up.  I would like to take this opportunity to thank you for allowing me to participate in the care of your patient.Armstead Peaks., MD

## 2016-05-11 DIAGNOSIS — G250 Essential tremor: Secondary | ICD-10-CM | POA: Diagnosis not present

## 2016-05-11 DIAGNOSIS — G620 Drug-induced polyneuropathy: Secondary | ICD-10-CM | POA: Diagnosis not present

## 2016-05-11 DIAGNOSIS — T451X5A Adverse effect of antineoplastic and immunosuppressive drugs, initial encounter: Secondary | ICD-10-CM | POA: Diagnosis not present

## 2016-05-17 DIAGNOSIS — J449 Chronic obstructive pulmonary disease, unspecified: Secondary | ICD-10-CM | POA: Diagnosis not present

## 2016-05-18 ENCOUNTER — Other Ambulatory Visit: Payer: Self-pay | Admitting: *Deleted

## 2016-05-18 MED ORDER — GABAPENTIN 300 MG PO CAPS: 300.0000 mg | ORAL_CAPSULE | Freq: Three times a day (TID) | ORAL | 3 refills | Status: DC

## 2016-05-18 NOTE — Telephone Encounter (Signed)
Received refill request from [facility] on Gabapentin '300mg'$ .  Call pt to confirm current dosage regimen, Pt states he is taking '300mg'$  capsule TID.   Rx sent to pharmacy.

## 2016-05-24 ENCOUNTER — Other Ambulatory Visit: Payer: Self-pay | Admitting: *Deleted

## 2016-05-24 DIAGNOSIS — G620 Drug-induced polyneuropathy: Secondary | ICD-10-CM

## 2016-05-24 DIAGNOSIS — C3491 Malignant neoplasm of unspecified part of right bronchus or lung: Secondary | ICD-10-CM

## 2016-05-24 DIAGNOSIS — T451X5A Adverse effect of antineoplastic and immunosuppressive drugs, initial encounter: Secondary | ICD-10-CM

## 2016-05-24 DIAGNOSIS — C3411 Malignant neoplasm of upper lobe, right bronchus or lung: Secondary | ICD-10-CM

## 2016-05-24 MED ORDER — HYDROCODONE-ACETAMINOPHEN 5-325 MG PO TABS: 1.0000 | ORAL_TABLET | Freq: Three times a day (TID) | ORAL | 0 refills | Status: DC | PRN

## 2016-05-24 NOTE — Telephone Encounter (Signed)
Per Dr Janyth Pupa note, he suggests that patient reduce his use of Hydrocodone as he is adjusting other meds for his neuropathy pain.  1. Peripheral neuropathy:  -Discussed Nerve Conduction Study - electromyography results, indicating neuropathy in both upper and lower extremities, worse in lower extremities and left ulnar neuropathy.  -Continue Cymbalta '40mg'$  nightly and add Cymbalta '20mg'$  once in the morning for a total of '60mg'$  of Cymbalta daily.  -Discussed reducing hydrocodone. If increased Cymbalta controls pain well then we will consider decreasing hydrocodone. Continue current hydrocodone dose at this time. Patient should talk to physician prescribing hydrocodone concerning any changes in his hydrocodone dose.   Dr. Vladimir Crofts, MD  Board Certified in Neurology and Clinical Neurophysiology  [place]

## 2016-05-31 ENCOUNTER — Encounter: Payer: Self-pay | Admitting: Urology

## 2016-05-31 ENCOUNTER — Ambulatory Visit (INDEPENDENT_AMBULATORY_CARE_PROVIDER_SITE_OTHER): Payer: Medicare Other | Admitting: Urology

## 2016-05-31 VITALS — BP 160/83 | HR 71 | Ht 69.0 in | Wt 215.0 lb

## 2016-05-31 DIAGNOSIS — C679 Malignant neoplasm of bladder, unspecified: Secondary | ICD-10-CM

## 2016-05-31 MED ORDER — CIPROFLOXACIN HCL 500 MG PO TABS
500.0000 mg | ORAL_TABLET | Freq: Once | ORAL | Status: AC
  Administered 2016-05-31: 500 mg via ORAL

## 2016-05-31 MED ORDER — LIDOCAINE HCL 2 % EX GEL
1.0000 "application " | Freq: Once | CUTANEOUS | Status: AC
  Administered 2016-05-31: 1 via URETHRAL

## 2016-05-31 NOTE — Progress Notes (Signed)
   05/31/16  CC:  Chief Complaint  Patient presents with  . Cysto    HPI: The patient is a 75 year old gentleman with a past medical history of high-grade 3 cm Ta bladder cancer with no muscle specimen. This was originally diagnosed in October 2016. He underwent reresection in February 2017 which showed no evidence of disease. He has not undergone intravesical immunotherapy because he has been undergoing Taxol-based chemotherapy for a primary lung cancer. He ended up not undergoing BCG due to his lung cancer. He has had no evidence of recurrence since primary resection.  Blood pressure (!) 160/83, pulse 71, height '5\' 9"'$  (1.753 m), weight 215 lb (97.5 kg). NED. A&Ox3.   No respiratory distress   Abd soft, NT, ND Normal phallus with bilateral descended testicles  Cystoscopy Procedure Note  Patient identification was confirmed, informed consent was obtained, and patient was prepped using Betadine solution.  Lidocaine jelly was administered per urethral meatus.    Preoperative abx where received prior to procedure.     Pre-Procedure: - Inspection reveals a normal caliber ureteral meatus.  Procedure: The flexible cystoscope was introduced without difficulty - Bulbar urethral stricture - wide caliber. Passively dilated with cystoscope - Enlarged prostate  - Normal bladder neck - Bilateral ureteral orifices identified - Bladder mucosa  reveals no ulcers, tumors, or lesions - No bladder stones - No trabeculation  Retroflexion shows small intravesical   Post-Procedure: - Patient tolerated the procedure well  Assessment/ Plan:  1. High-grade pTa TCC of the bladder -No evidence of disease. Continue q53monthcystoscopy until October 2018. Can increase interval at that time if he continues to be disease free.

## 2016-06-15 ENCOUNTER — Telehealth: Payer: Self-pay | Admitting: Cardiovascular Disease

## 2016-06-15 NOTE — Telephone Encounter (Signed)
Attempted to schedule fu from recall.   Patient states he does not need it he is all set.  Deleting recall.

## 2016-06-17 DIAGNOSIS — J449 Chronic obstructive pulmonary disease, unspecified: Secondary | ICD-10-CM | POA: Diagnosis not present

## 2016-06-24 ENCOUNTER — Other Ambulatory Visit: Payer: Self-pay | Admitting: Family Medicine

## 2016-06-24 DIAGNOSIS — I10 Essential (primary) hypertension: Secondary | ICD-10-CM

## 2016-06-24 DIAGNOSIS — N400 Enlarged prostate without lower urinary tract symptoms: Secondary | ICD-10-CM

## 2016-06-24 DIAGNOSIS — R7303 Prediabetes: Secondary | ICD-10-CM

## 2016-06-24 DIAGNOSIS — C3411 Malignant neoplasm of upper lobe, right bronchus or lung: Secondary | ICD-10-CM

## 2016-06-25 ENCOUNTER — Other Ambulatory Visit: Payer: Medicare Other

## 2016-06-25 ENCOUNTER — Ambulatory Visit: Payer: Self-pay | Admitting: Family Medicine

## 2016-06-25 ENCOUNTER — Ambulatory Visit (INDEPENDENT_AMBULATORY_CARE_PROVIDER_SITE_OTHER): Payer: Medicare Other

## 2016-06-25 VITALS — BP 112/60 | HR 73 | Temp 98.2°F | Ht 67.5 in | Wt 218.8 lb

## 2016-06-25 DIAGNOSIS — I10 Essential (primary) hypertension: Secondary | ICD-10-CM

## 2016-06-25 DIAGNOSIS — N400 Enlarged prostate without lower urinary tract symptoms: Secondary | ICD-10-CM

## 2016-06-25 DIAGNOSIS — R7303 Prediabetes: Secondary | ICD-10-CM | POA: Diagnosis not present

## 2016-06-25 DIAGNOSIS — C3411 Malignant neoplasm of upper lobe, right bronchus or lung: Secondary | ICD-10-CM | POA: Diagnosis not present

## 2016-06-25 DIAGNOSIS — Z0000 Encounter for general adult medical examination without abnormal findings: Secondary | ICD-10-CM

## 2016-06-25 LAB — COMPREHENSIVE METABOLIC PANEL
ALK PHOS: 99 U/L (ref 39–117)
ALT: 58 U/L — AB (ref 0–53)
AST: 43 U/L — AB (ref 0–37)
Albumin: 3.9 g/dL (ref 3.5–5.2)
BILIRUBIN TOTAL: 0.4 mg/dL (ref 0.2–1.2)
BUN: 20 mg/dL (ref 6–23)
CO2: 32 meq/L (ref 19–32)
CREATININE: 0.74 mg/dL (ref 0.40–1.50)
Calcium: 9 mg/dL (ref 8.4–10.5)
Chloride: 100 mEq/L (ref 96–112)
GFR: 109.3 mL/min (ref >60.00)
GLUCOSE: 118 mg/dL — AB (ref 70–99)
Potassium: 4.2 mEq/L (ref 3.5–5.1)
Sodium: 138 mEq/L (ref 135–145)
TOTAL PROTEIN: 7.2 g/dL (ref 6.0–8.3)

## 2016-06-25 LAB — CBC WITH DIFFERENTIAL/PLATELET
BASOS PCT: 0.3 % (ref 0.0–3.0)
Basophils Absolute: 0 10*3/uL (ref 0.0–0.1)
EOS PCT: 4.2 % (ref 0.0–5.0)
Eosinophils Absolute: 0.3 10*3/uL (ref 0.0–0.7)
HCT: 38.8 % — ABNORMAL LOW (ref 39.0–52.0)
Hemoglobin: 13.3 g/dL (ref 13.0–17.0)
LYMPHS ABS: 1.3 10*3/uL (ref 0.7–4.0)
Lymphocytes Relative: 19.6 % (ref 12.0–46.0)
MCHC: 34.2 g/dL (ref 30.0–36.0)
MCV: 89.6 fl (ref 78.0–100.0)
MONO ABS: 0.6 10*3/uL (ref 0.1–1.0)
Monocytes Relative: 9.7 % (ref 3.0–12.0)
NEUTROS ABS: 4.4 10*3/uL (ref 1.4–7.7)
NEUTROS PCT: 66.2 % (ref 43.0–77.0)
PLATELETS: 241 10*3/uL (ref 150.0–400.0)
RBC: 4.33 Mil/uL (ref 4.22–5.81)
RDW: 15 % (ref 11.5–15.5)
WBC: 6.7 10*3/uL (ref 4.0–10.5)

## 2016-06-25 LAB — HEMOGLOBIN A1C: Hgb A1c MFr Bld: 6.7 % — ABNORMAL HIGH (ref 4.6–6.5)

## 2016-06-25 LAB — LIPID PANEL
CHOL/HDL RATIO: 5
Cholesterol: 186 mg/dL (ref 0–200)
HDL: 39.5 mg/dL (ref >39.00)
LDL Cholesterol: 121 mg/dL — ABNORMAL HIGH (ref 0–99)
NonHDL: 146.59
TRIGLYCERIDES: 128 mg/dL (ref 0.0–149.0)
VLDL: 25.6 mg/dL (ref 0.0–40.0)

## 2016-06-25 LAB — PSA: PSA: 0.92 ng/mL (ref 0.10–4.00)

## 2016-06-25 NOTE — Progress Notes (Signed)
Pre visit review using our clinic review tool, if applicable. No additional management support is needed unless otherwise documented below in the visit note. 

## 2016-06-25 NOTE — Patient Instructions (Signed)
Joseph Graham , Thank you for taking time to come for your Medicare Wellness Visit. I appreciate your ongoing commitment to your health goals. Please review the following plan we discussed and let me know if I can assist you in the future.   These are the goals we discussed: Goals    . Increase physical activity          Within 3-4 weeks, I will attempt to exercise at least 60 min 3 days per week.        This is a list of the screening recommended for you and due dates:  Health Maintenance  Topic Date Due  . DTaP/Tdap/Td vaccine (1 - Tdap) 07/30/2021*  . Colon Cancer Screening  09/17/2016  . Tetanus Vaccine  07/30/2021  . Pneumonia vaccines  Completed  *Topic was postponed. The date shown is not the original due date.   Preventive Care for Adults  A healthy lifestyle and preventive care can promote health and wellness. Preventive health guidelines for adults include the following key practices.  . A routine yearly physical is a good way to check with your health care provider about your health and preventive screening. It is a chance to share any concerns and updates on your health and to receive a thorough exam.  . Visit your dentist for a routine exam and preventive care every 6 months. Brush your teeth twice a day and floss once a day. Good oral hygiene prevents tooth decay and gum disease.  . The frequency of eye exams is based on your age, health, family medical history, use  of contact lenses, and other factors. Follow your health care provider's ecommendations for frequency of eye exams.  . Eat a healthy diet. Foods like vegetables, fruits, whole grains, low-fat dairy products, and lean protein foods contain the nutrients you need without too many calories. Decrease your intake of foods high in solid fats, added sugars, and salt. Eat the right amount of calories for you. Get information about a proper diet from your health care provider, if necessary.  . Regular physical  exercise is one of the most important things you can do for your health. Most adults should get at least 150 minutes of moderate-intensity exercise (any activity that increases your heart rate and causes you to sweat) each week. In addition, most adults need muscle-strengthening exercises on 2 or more days a week.  Silver Sneakers may be a benefit available to you. To determine eligibility, you may visit the website: www.silversneakers.com or contact program at [phone] Mon-Fri between 8AM-8PM.   . Maintain a healthy weight. The body mass index (BMI) is a screening tool to identify possible weight problems. It provides an estimate of body fat based on height and weight. Your health care provider can find your BMI and can help you achieve or maintain a healthy weight.   For adults 20 years and older: ? A BMI below 18.5 is considered underweight. ? A BMI of 18.5 to 24.9 is normal. ? A BMI of 25 to 29.9 is considered overweight. ? A BMI of 30 and above is considered obese.   . Maintain normal blood lipids and cholesterol levels by exercising and minimizing your intake of saturated fat. Eat a balanced diet with plenty of fruit and vegetables. Blood tests for lipids and cholesterol should begin at age 11 and be repeated every 5 years. If your lipid or cholesterol levels are high, you are over 50, or you are at high risk for heart  disease, you may need your cholesterol levels checked more frequently. Ongoing high lipid and cholesterol levels should be treated with medicines if diet and exercise are not working.  . If you smoke, find out from your health care provider how to quit. If you do not use tobacco, please do not start.  . If you choose to drink alcohol, please do not consume more than 2 drinks per day. One drink is considered to be 12 ounces (355 mL) of beer, 5 ounces (148 mL) of wine, or 1.5 ounces (44 mL) of liquor.  . If you are 52-23 years old, ask your health care provider if you  should take aspirin to prevent strokes.  . Use sunscreen. Apply sunscreen liberally and repeatedly throughout the day. You should seek shade when your shadow is shorter than you. Protect yourself by wearing long sleeves, pants, a wide-brimmed hat, and sunglasses year round, whenever you are outdoors.  . Once a month, do a whole body skin exam, using a mirror to look at the skin on your back. Tell your health care provider of new moles, moles that have irregular borders, moles that are larger than a pencil eraser, or moles that have changed in shape or color.

## 2016-06-25 NOTE — Progress Notes (Signed)
PCP notes:   Health maintenance:  No gaps identified.  Abnormal screenings:   Mini-Cog score: 18/20  Patient concerns:   None  Nurse concerns:  None  Next PCP appt:   07/02/16 @ 1400

## 2016-06-25 NOTE — Progress Notes (Signed)
Subjective:   Joseph Graham is a 76 y.o. male who presents for Medicare Annual/Subsequent preventive examination.  Review of Systems:  N/A Cardiac Risk Factors include: advanced age (>67mn, >>37women);obesity (BMI >30kg/m2);male gender;hypertension     Objective:    Vitals: BP 112/60 (BP Location: Right Arm, Patient Position: Sitting, Cuff Size: Normal)   Pulse 73   Temp 98.2 F (36.8 C) (Oral)   Ht 5' 7.5" (1.715 m) Comment: shoes  Wt 218 lb 12 oz (99.2 kg)   SpO2 91%   BMI 33.76 kg/m   Body mass index is 33.76 kg/m.  Tobacco History  Smoking Status  . Former Smoker  . Packs/day: 1.50  . Years: 61.00  . Types: Cigarettes  . Quit date: 03/08/2015  Smokeless Tobacco  . Never Used    Comment: cut back 0.5 cigarettes--stopped 03/07/15; now now chantix     Counseling given: No   Past Medical History:  Diagnosis Date  . Alcohol abuse, in remission    remote  . Arthritis    BACK AND NECK  . Benign essential tremor    improved on metoprolol and gabapentin  . BPH (benign prostatic hypertrophy) 12/30/2012  . Cardiac arrhythmia 03/2010   h/o a flutter and CM, s/p ablation, normal stress test  . Chronic low back pain    MRI 2004, multilevel DDD  . Colon polyps    next colonoscopy due around 2018  . Complication of anesthesia    DID NOT GET COMPLETELY NUMB WITH TONSILLECTOMY  . COPD (chronic obstructive pulmonary disease) ([place]   . Coronary artery disease   . Dyslipidemia    mild off meds  . GERD (gastroesophageal reflux disease)   . HTN (hypertension)   . Multiple pulmonary nodules 02/2015  . Neuromuscular disorder ([place]    neuropathy from chemo....in feet  . Peripheral neuropathy due to chemotherapy ([facility] 12/23/2015   Saw Dr SManuella Ghazi on gabapentin and cymbalta with hydrocodone PRN (03/2016)  . Personal history of tobacco use, presenting hazards to health 02/18/2015   quit 06/2011, restarted 2014  . Primary cancer of bladder ([facility] 2016   [place]  . Primary lung  cancer ([facility] 2016   port a cath removed 03/2016  . Shortness of breath dyspnea    OCC WITH EXERTION   Past Surgical History:  Procedure Laterality Date  . A FLUTTER ABLATION  03/2010  . carotid [place] 03/2010   WNL  . CATARACT EXTRACTION    . COLONOSCOPY  09/2011   polyps, rpt due 5 yrs, mild diverticulosis ([name]  . CYSTOSCOPY W/ RETROGRADES Bilateral 04/06/2015   Procedure: CYSTOSCOPY WITH RETROGRADE PYELOGRAM;  Surgeon: BNickie Retort MD;  Location: [facility] ORS;  Service: Urology;  Laterality: Bilateral;  . ELECTROMAGNETIC NAVIGATION BROCHOSCOPY N/A 03/02/2015   Procedure: ELECTROMAGNETIC NAVIGATION BRONCHOSCOPY;  Surgeon: VVilinda Boehringer MD;  Location: [facility] ORS;  Service: Cardiopulmonary;  Laterality: N/A;  . [place] . PORT-A-CATH REMOVAL Right 04/03/2016   Procedure: REMOVAL PORT-A-CATH;  Surgeon: TNestor Lewandowsky MD;  Location: [facility] ORS;  Service: General;  Laterality: Right;  . PORTACATH PLACEMENT N/A 08/10/2015   Procedure: INSERTION PORT-A-CATH;  Surgeon: TNestor Lewandowsky MD;  Location: [facility] OR[facility];  Service: General;  Laterality: N/A;  . [place] . TRANSURETHRAL RESECTION OF BLADDER TUMOR N/A 04/06/2015   Procedure: TRANSURETHRAL RESECTION OF BLADDER TUMOR (TURBT) right ureteral stent placement;  Surgeon: BNickie Retort MD;  Location: [facility] OR[facility];  Service: Urology;  Laterality: N/A;  . TRANSURETHRAL  RESECTION OF BLADDER TUMOR N/A 07/13/2015   Procedure: TRANSURETHRAL RESECTION OF BLADDER TUMOR (TURBT);  Surgeon: [name], MD;  Location: [facility] OR[facility];  Service: Urology;  Laterality: N/A;  . urinary stent removal  04/14/15   Family History  Problem Relation Age of Onset  . Cervical cancer Mother 42  . Hypertension Father   . Tremor Father     and several uncles/aunts (not parkinson's)  . Stroke Brother 1  . Bladder Cancer Brother 82  . Prostate cancer Neg Hx   . Kidney cancer Neg Hx    History  Sexual Activity  . Sexual activity: No     Outpatient Encounter Prescriptions as of 06/25/2016  Medication Sig  . Cyanocobalamin (B-12 PO) Take 1 tablet by mouth daily.  . DULoxetine (CYMBALTA) 20 MG capsule Take 30 mg by mouth 2 (two) times daily.   Mar[place] Kitchen gabapentin (NEURONTIN) 300 MG capsule Take 1 capsule (300 mg total) by mouth 3 (three) times daily.  Mar[place] Kitchen HYDROcodone-acetaminophen (NORCO) 5-325 MG tablet Take 1 tablet by mouth every 8 (eight) hours as needed for moderate pain.  Marland Kitchen lisinopril (PRINIVIL,ZESTRIL) 10 MG tablet Take 10 mg by mouth every evening.   . Magnesium Cl-Calcium Carbonate (SLOW-MAG PO) Take 1 tablet by mouth daily.  . metoprolol tartrate (LOPRESSOR) 25 MG tablet TAKE 0.5 TABLETS (12.5 MG TOTAL) BY MOUTH 2 (TWO) TIMES DAILY.  . Multiple Vitamin (MULTI-VITAMINS) TABS Take by mouth.  Marland Kitchen VITAMIN E PO Take 1 tablet by mouth daily.  Marland Kitchen albuterol (PROVENTIL HFA;VENTOLIN HFA) 108 (90 Base) MCG/ACT inhaler Inhale 2 puffs into the lungs every 6 (six) hours as needed for wheezing or shortness of breath. (Patient not taking: Reported on 06/25/2016)  . furosemide (LASIX) 40 MG tablet Take 1 tablet (40 mg total) by mouth daily. (Patient not taking: Reported on 06/25/2016)   No facility-administered encounter medications on file as of 06/25/2016.     Activities of Daily Living In your present state of health, do you have any difficulty performing the following activities: 06/25/2016 04/02/2016  Hearing? N N  Vision? N N  Difficulty concentrating or making decisions? N N  Walking or climbing stairs? Y N  Dressing or bathing? N N  Doing errands, shopping? N N  Preparing Food and eating ? N -  Using the Toilet? N -  In the past six months, have you accidently leaked urine? N -  Do you have problems with loss of bowel control? N -  Managing your Medications? N -  Managing your Finances? N -  Housekeeping or managing your Housekeeping? N -  Some recent data might be hidden    Patient Care Team: [name], MD as PCP -  General (Family Medicine) Cammie Sickle, MD as Consulting Physician (Internal Medicine) [name], MD as Consulting Physician (Urology) Vladimir Crofts, MD as Consulting Physician (Neurology) Minna Merritts, MD as Consulting Physician (Cardiology) Noreene Filbert, MD as Referring Physician (Radiation Oncology) [name], MD as Referring Physician (Cardiothoracic Surgery)   Assessment:     Hearing Screening   '125Hz'$  '250Hz'$  '500Hz'$  '1000Hz'$  '2000Hz'$  '3000Hz'$  '4000Hz'$  '6000Hz'$  '8000Hz'$   Right ear:   40 40 40  40    Left ear:   40 40 40  40    Vision Screening Comments: Last vision exam in 2017 [facility]    Exercise Activities and Dietary recommendations Current Exercise Habits: The patient does not participate in regular exercise at present, Exercise limited by: neurologic condition(s);Other -  see comments (pt has chemo-induced neuropathy but plans to begin gym 60 min 3 days/week)  Goals    . Increase physical activity          Within 3-4 weeks, I will attempt to exercise at least 60 min 3 days per week.       Fall Risk Fall Risk  06/25/2016 01/31/2015 01/05/2014 12/30/2012  Falls in the past year? No No No No   Depression Screen PHQ 2/9 Scores 06/25/2016 01/31/2015 01/05/2014 12/30/2012  PHQ - 2 Score 0 0 0 0    Cognitive Function MMSE - Mini Mental State Exam 06/25/2016  Orientation to time 5  Orientation to Place 5  Registration 3  Attention/ Calculation 0  Recall 1  Recall-comments pt was unable to recall 2 of 3 words  Language- name 2 objects 0  Language- repeat 1  Language- follow 3 step command 3  Language- read & follow direction 0  Write a sentence 0  Copy design 0  Total score 18     PLEASE NOTE: A Mini-Cog screen was completed. Maximum score is 20. A value of 0 denotes this part of [place] MMSE was not completed or the patient failed this part of the Mini-Cog screening.   Mini-Cog Screening Orientation to Time - Max 5 pts Orientation to Place -  Max 5 pts Registration - Max 3 pts Recall - Max 3 pts Language Repeat - Max 1 pts Language Follow 3 Step Command - Max 3 pts     Immunization History  Administered Date(s) Administered  . Pneumococcal Conjugate-13 01/05/2014  . Pneumococcal Polysaccharide-23 06/11/2010  . Td 07/31/2011   Screening Tests Health Maintenance  Topic Date Due  . DTaP/Tdap/Td (1 - Tdap) 07/30/2021 (Originally 08/01/2011)  . COLONOSCOPY  09/17/2016  . TETANUS/TDAP  07/30/2021  . PNA vac Low Risk Adult  Completed      Plan:     I have personally reviewed and addressed the Medicare Annual Wellness questionnaire and have noted the following in the patient's chart:  A. Medical and social history B. Use of alcohol, tobacco or illicit drugs  C. Current medications and supplements D. Functional ability and status E.  Nutritional status F.  Physical activity G. Advance directives H. List of other physicians I.  Hospitalizations, surgeries, and ER visits in previous 12 months J.  [place] to include hearing, vision, cognitive, depression L. Referrals and appointments - none  In addition, I have reviewed and discussed with patient certain preventive protocols, quality metrics, and best practice recommendations. A written personalized care plan for preventive services as well as general preventive health recommendations were provided to patient.  See attached scanned questionnaire for additional information.   Signed,   Lindell Noe, MHA, BS, LPN Health Coach'

## 2016-06-25 NOTE — Progress Notes (Signed)
I reviewed health advisor's note, was available for consultation, and agree with documentation and plan.  

## 2016-07-02 ENCOUNTER — Ambulatory Visit (INDEPENDENT_AMBULATORY_CARE_PROVIDER_SITE_OTHER): Payer: Medicare Other | Admitting: Family Medicine

## 2016-07-02 ENCOUNTER — Encounter: Payer: Self-pay | Admitting: Family Medicine

## 2016-07-02 VITALS — BP 132/60 | HR 80 | Temp 98.1°F | Wt 218.0 lb

## 2016-07-02 DIAGNOSIS — I10 Essential (primary) hypertension: Secondary | ICD-10-CM

## 2016-07-02 DIAGNOSIS — Z0000 Encounter for general adult medical examination without abnormal findings: Secondary | ICD-10-CM

## 2016-07-02 DIAGNOSIS — C679 Malignant neoplasm of bladder, unspecified: Secondary | ICD-10-CM

## 2016-07-02 DIAGNOSIS — C3411 Malignant neoplasm of upper lobe, right bronchus or lung: Secondary | ICD-10-CM

## 2016-07-02 DIAGNOSIS — J449 Chronic obstructive pulmonary disease, unspecified: Secondary | ICD-10-CM

## 2016-07-02 DIAGNOSIS — G250 Essential tremor: Secondary | ICD-10-CM

## 2016-07-02 DIAGNOSIS — Z7189 Other specified counseling: Secondary | ICD-10-CM | POA: Diagnosis not present

## 2016-07-02 DIAGNOSIS — Z87891 Personal history of nicotine dependence: Secondary | ICD-10-CM

## 2016-07-02 DIAGNOSIS — N400 Enlarged prostate without lower urinary tract symptoms: Secondary | ICD-10-CM

## 2016-07-02 DIAGNOSIS — I2510 Atherosclerotic heart disease of native coronary artery without angina pectoris: Secondary | ICD-10-CM

## 2016-07-02 DIAGNOSIS — T451X5A Adverse effect of antineoplastic and immunosuppressive drugs, initial encounter: Secondary | ICD-10-CM

## 2016-07-02 DIAGNOSIS — I739 Peripheral vascular disease, unspecified: Secondary | ICD-10-CM

## 2016-07-02 DIAGNOSIS — E119 Type 2 diabetes mellitus without complications: Secondary | ICD-10-CM

## 2016-07-02 DIAGNOSIS — G620 Drug-induced polyneuropathy: Secondary | ICD-10-CM

## 2016-07-02 MED ORDER — LISINOPRIL 10 MG PO TABS: 10.0000 mg | ORAL_TABLET | Freq: Every evening | ORAL | 3 refills | Status: DC

## 2016-07-02 MED ORDER — METOPROLOL TARTRATE 25 MG PO TABS: 12.5000 mg | ORAL_TABLET | Freq: Two times a day (BID) | ORAL | 3 refills | Status: DC

## 2016-07-02 NOTE — Assessment & Plan Note (Signed)
Advanced directives: scanned 04/07/2015. Would not want extended life support. Does want CPR and life support if deemed reversible/temporary. Wife then daughter are HCPOA.

## 2016-07-02 NOTE — Assessment & Plan Note (Signed)
Preventative protocols reviewed and updated unless pt declined. Discussed healthy diet and lifestyle.  

## 2016-07-02 NOTE — Assessment & Plan Note (Signed)
Plaque in aortic arch and descending aorta by imaging Consider aspirin, statin.

## 2016-07-02 NOTE — Assessment & Plan Note (Deleted)
S/p radiation therapy and 4 chemo sessions 2016. followed by onc - appreciate care.

## 2016-07-02 NOTE — Assessment & Plan Note (Signed)
Chronic, stable. Continue current regimen. 

## 2016-07-02 NOTE — Assessment & Plan Note (Signed)
Consider aspirin, statin.

## 2016-07-02 NOTE — Patient Instructions (Addendum)
Sugar was too high - currently diet controlled diabetes.  Decrease carbs, avoid sweetened beverages and added sugars in diet. Choose complex carbs when choosing carbs. Restart gym.  Return in 6 months for diabetes check.  Good to see you today, call us with questions.  Health Maintenance, Male A healthy lifestyle and preventative care can promote health and wellness.  Maintain regular health, dental, and eye exams.  Eat a healthy diet. Foods like vegetables, fruits, whole grains, low-fat dairy products, and lean protein foods contain the nutrients you need and are low in calories. Decrease your intake of foods high in solid fats, added sugars, and salt. Get information about a proper diet from your health care provider, if necessary.  Regular physical exercise is one of the most important things you can do for your health. Most adults should get at least 150 minutes of moderate-intensity exercise (any activity that increases your heart rate and causes you to sweat) each week. In addition, most adults need muscle-strengthening exercises on 2 or more days a week.   Maintain a healthy weight. The body mass index (BMI) is a screening tool to identify possible weight problems. It provides an estimate of body fat based on height and weight. Your health care provider can find your BMI and can help you achieve or maintain a healthy weight. For males 20 years and older:  A BMI below 18.5 is considered underweight.  A BMI of 18.5 to 24.9 is normal.  A BMI of 25 to 29.9 is considered overweight.  A BMI of 30 and above is considered obese.  Maintain normal blood lipids and cholesterol by exercising and minimizing your intake of saturated fat. Eat a balanced diet with plenty of fruits and vegetables. Blood tests for lipids and cholesterol should begin at age 70 and be repeated every 5 years. If your lipid or cholesterol levels are high, you are over age 50, or you are at high risk for heart disease, you  may need your cholesterol levels checked more frequently.Ongoing high lipid and cholesterol levels should be treated with medicines if diet and exercise are not working.  If you smoke, find out from your health care provider how to quit. If you do not use tobacco, do not start.  Lung cancer screening is recommended for adults aged 10-80 years who are at high risk for developing lung cancer because of a history of smoking. A yearly low-dose CT scan of the lungs is recommended for people who have at least a 30-pack-year history of smoking and are current smokers or have quit within the past 15 years. A pack year of smoking is smoking an average of 1 pack of cigarettes a day for 1 year (for example, a 30-pack-year history of smoking could mean smoking 1 pack a day for 30 years or 2 packs a day for 15 years). Yearly screening should continue until the smoker has stopped smoking for at least 15 years. Yearly screening should be stopped for people who develop a health problem that would prevent them from having lung cancer treatment.  If you choose to drink alcohol, do not have more than 2 drinks per day. One drink is considered to be 12 oz (360 mL) of beer, 5 oz (150 mL) of wine, or 1.5 oz (45 mL) of liquor.  Avoid the use of street drugs. Do not share needles with anyone. Ask for help if you need support or instructions about stopping the use of drugs.  High blood pressure causes  heart disease and increases the risk of stroke. High blood pressure is more likely to develop in:  People who have blood pressure in the end of the normal range (100-139/85-89 mm Hg).  People who are overweight or obese.  People who are African American.  If you are 74-63 years of age, have your blood pressure checked every 3-5 years. If you are 39 years of age or older, have your blood pressure checked every year. You should have your blood pressure measured twice-once when you are at a hospital or clinic, and once when you  are not at a hospital or clinic. Record the average of the two measurements. To check your blood pressure when you are not at a hospital or clinic, you can use:  An automated blood pressure machine at a pharmacy.  A home blood pressure monitor.  If you are 81-14 years old, ask your health care provider if you should take aspirin to prevent heart disease.  Diabetes screening involves taking a blood sample to check your fasting blood sugar level. This should be done once every 3 years after age 38 if you are at a normal weight and without risk factors for diabetes. Testing should be considered at a younger age or be carried out more frequently if you are overweight and have at least 1 risk factor for diabetes.  Colorectal cancer can be detected and often prevented. Most routine colorectal cancer screening begins at the age of 73 and continues through age 16. However, your health care provider may recommend screening at an earlier age if you have risk factors for colon cancer. On a yearly basis, your health care provider may provide home test kits to check for hidden blood in the stool. A small camera at the end of a tube may be used to directly examine the colon (sigmoidoscopy or colonoscopy) to detect the earliest forms of colorectal cancer. Talk to your health care provider about this at age 38 when routine screening begins. A direct exam of the colon should be repeated every 5-10 years through age 57, unless early forms of precancerous polyps or small growths are found.  People who are at an increased risk for hepatitis B should be screened for this virus. You are considered at high risk for hepatitis B if:  You were born in a country where hepatitis B occurs often. Talk with your health care provider about which countries are considered high risk.  Your parents were born in a high-risk country and you have not received a shot to protect against hepatitis B (hepatitis B vaccine).  You have HIV or  AIDS.  You use needles to inject street drugs.  You live with, or have sex with, someone who has hepatitis B.  You are a man who has sex with other men (MSM).  You get hemodialysis treatment.  You take certain medicines for conditions like cancer, organ transplantation, and autoimmune conditions.  Hepatitis C blood testing is recommended for all people born from [birth year] through 1965 and any individual with known risk factors for hepatitis C.  Healthy men should no longer receive prostate-specific antigen (PSA) blood tests as part of routine cancer screening. Talk to your health care provider about prostate cancer screening.  Testicular cancer screening is not recommended for adolescents or adult males who have no symptoms. Screening includes self-exam, a health care provider exam, and other screening tests. Consult with your health care provider about any symptoms you have or any concerns you have about  testicular cancer.  Practice safe sex. Use condoms and avoid high-risk sexual practices to reduce the spread of sexually transmitted infections (STIs).  You should be screened for STIs, including gonorrhea and chlamydia if:  You are sexually active and are younger than 24 years.  You are older than 24 years, and your health care provider tells you that you are at risk for this type of infection.  Your sexual activity has changed since you were last screened, and you are at an increased risk for chlamydia or gonorrhea. Ask your health care provider if you are at risk.  If you are at risk of being infected with HIV, it is recommended that you take a prescription medicine daily to prevent HIV infection. This is called pre-exposure prophylaxis (PrEP). You are considered at risk if:  You are a man who has sex with other men (MSM).  You are a heterosexual man who is sexually active with multiple partners.  You take drugs by injection.  You are sexually active with a partner who has  HIV.  Talk with your health care provider about whether you are at high risk of being infected with HIV. If you choose to begin PrEP, you should first be tested for HIV. You should then be tested every 3 months for as long as you are taking PrEP.  Use sunscreen. Apply sunscreen liberally and repeatedly throughout the day. You should seek shade when your shadow is shorter than you. Protect yourself by wearing long sleeves, pants, a wide-brimmed hat, and sunglasses year round whenever you are outdoors.  Tell your health care provider of new moles or changes in moles, especially if there is a change in shape or color. Also, tell your health care provider if a mole is larger than the size of a pencil eraser.  A one-time screening for abdominal aortic aneurysm (AAA) and surgical repair of large AAAs by ultrasound is recommended for men aged 56-75 years who are current or former smokers.  Stay current with your vaccines (immunizations). This information is not intended to replace advice given to you by your health care provider. Make sure you discuss any questions you have with your health care provider. Document Released: 11/24/2007 Document Revised: 06/18/2014 Document Reviewed: 03/01/2015 Elsevier Interactive Patient Education  2017 [employer].

## 2016-07-02 NOTE — Assessment & Plan Note (Signed)
Not on meds. Consider updated spirometry.

## 2016-07-02 NOTE — Progress Notes (Signed)
Pre visit review using our clinic review tool, if applicable. No additional management support is needed unless otherwise documented below in the visit note. 

## 2016-07-02 NOTE — Assessment & Plan Note (Signed)
S/p resection, followed by active surveillance cystoscopy Q43mothrough 03/2017.

## 2016-07-02 NOTE — Assessment & Plan Note (Signed)
Followed by neurology - on gabapentin '300mg'$  TID dosing, notes persistent trouble likely from progressive worsening. To discuss other options with neurology Dr Manuella Ghazi.

## 2016-07-02 NOTE — Assessment & Plan Note (Signed)
S/p radiation therapy and 4 chemo sessions 2016. followed by onc - appreciate care.

## 2016-07-02 NOTE — Assessment & Plan Note (Signed)
Followed by neurology.   

## 2016-07-02 NOTE — Assessment & Plan Note (Signed)
Quit 02/2015 after lung cancer dx. Continues encouraging wife to quit.

## 2016-07-02 NOTE — Progress Notes (Signed)
BP 132/60   Pulse 80   Temp 98.1 F (36.7 C) (Oral)   Wt 218 lb (98.9 kg)   BMI 33.64 kg/m    CC: CPE Subjective:    Patient ID: Joseph Graham, male    DOB: 1941-06-09, 76 y.o.   MRN: [id number]  HPI: Joseph Graham is a 76 y.o. male presenting on 07/02/2016 for Annual Exam   Saw Katha Cabal last week for medicare wellness visit. Note reviewed.   Found primary lung cancer by CT on 2016. During work up found primary bladder cancer. Completed radiation therapy and chemotherapy with resultant peripheral neuropathy, treated with cymbalta '60mg'$  daily - slowly improving. Monitoring with PET Q26mo Bladder cancer initially removed, now followed by Q361moystoscopy (BPilar Jarvis   Essential tremor - controlled with gabapentin '300mg'$  TID.   Preventative: Colonoscopy - 09/2011 polyps, rpt due 5 yrs, mild diverticulosis (GCarlean Purl Lung cancer screening - see above s/p lung cancer Prostate - mild BPH. Decided to age out.  Flu - declines.  Td 07/2011.  Pneumonia - 2012. prevnar - 12/2013.  zostavax - declines. Never had chicken pox.  Advanced directives: scanned 04/07/2015. Would not want extended life support. Does want CPR and life support if deemed reversible/temporary. Wife then daughter are HCPOA.  Seat belt use discussed Sunscreen use discussed. No changing moles noted. Ex smoker - quit 02/2016. Wife still smokes Alcohol - no  Caffeine: 1 cup decaf/week Lives with wife Occupation: retired, worked saPress photographerctivity: gym 3x/wk, moImmunologisthobbies (paint, building things) Diet: healthy, good water, fruits/vegetables daily, red meat 1x/wk   Relevant past medical, surgical, family and social history reviewed and updated as indicated. Interim medical history since our last visit reviewed. Allergies and medications reviewed and updated. Current Outpatient Prescriptions on File Prior to Visit  Medication Sig  . Cyanocobalamin (B-12 PO) Take 1 tablet by mouth daily.  . Marland Kitchenabapentin (NEURONTIN) 300 MG  capsule Take 1 capsule (300 mg total) by mouth 3 (three) times daily.  . Marland KitchenYDROcodone-acetaminophen (NORCO) 5-325 MG tablet Take 1 tablet by mouth every 8 (eight) hours as needed for moderate pain.  . Magnesium Cl-Calcium Carbonate (SLOW-MAG PO) Take 1 tablet by mouth daily.  . Multiple Vitamin (MULTI-VITAMINS) TABS Take by mouth.  . Marland KitchenITAMIN E PO Take 1 tablet by mouth daily.   No current facility-administered medications on file prior to visit.     Review of Systems  Constitutional: Negative for activity change, appetite change, chills, fatigue, fever and unexpected weight change.  HENT: Negative for hearing loss.   Eyes: Negative for visual disturbance.  Respiratory: Negative for cough, chest tightness, shortness of breath and wheezing.   Cardiovascular: Positive for leg swelling. Negative for chest pain and palpitations.  Gastrointestinal: Positive for constipation. Negative for abdominal distention, abdominal pain, blood in stool, diarrhea, nausea and vomiting.  Genitourinary: Negative for difficulty urinating and hematuria.  Musculoskeletal: Negative for arthralgias, myalgias and neck pain.  Skin: Negative for rash.  Neurological: Negative for dizziness, seizures, syncope and headaches.       Neuropathy  Hematological: Negative for adenopathy. Does not bruise/bleed easily.  Psychiatric/Behavioral: Negative for dysphoric mood. The patient is not nervous/anxious.    Per HPI unless specifically indicated in ROS section     Objective:    BP 132/60   Pulse 80   Temp 98.1 F (36.7 C) (Oral)   Wt 218 lb (98.9 kg)   BMI 33.64 kg/m   Wt Readings from Last 3 Encounters:  07/02/16 218 lb (  98.9 kg)  06/25/16 218 lb 12 oz (99.2 kg)  05/31/16 215 lb (97.5 kg)    Physical Exam  Constitutional: He is oriented to person, place, and time. He appears well-developed and well-nourished. No distress.  HENT:  Head: Normocephalic and atraumatic.  Right Ear: Hearing, tympanic membrane,  external ear and ear canal normal.  Left Ear: Hearing, tympanic membrane, external ear and ear canal normal.  Nose: Nose normal.  Mouth/Throat: Uvula is midline, oropharynx is clear and moist and mucous membranes are normal. No oropharyngeal exudate, posterior oropharyngeal edema or posterior oropharyngeal erythema.  Eyes: Conjunctivae and EOM are normal. Pupils are equal, round, and reactive to light. No scleral icterus.  Neck: Normal range of motion. Neck supple. Carotid bruit is not present. No thyromegaly present.  Cardiovascular: Normal rate, regular rhythm, normal heart sounds and intact distal pulses.   No murmur heard. Pulses:      Radial pulses are 2+ on the right side, and 2+ on the left side.  Pulmonary/Chest: Effort normal and breath sounds normal. No respiratory distress. He has no wheezes. He has no rales.  Abdominal: Soft. Bowel sounds are normal. He exhibits no distension and no mass. There is no tenderness. There is no rebound and no guarding.  Musculoskeletal: Normal range of motion. He exhibits edema (tr).  Lymphadenopathy:    He has no cervical adenopathy.  Neurological: He is alert and oriented to person, place, and time.  CN grossly intact, station and gait intact Unsteady due to neuropathy  Skin: Skin is warm and dry. No rash noted.  Psychiatric: He has a normal mood and affect. His behavior is normal. Judgment and thought content normal.  Nursing note and vitals reviewed.  Results for orders placed or performed in visit on 06/25/16  Lipid panel  Result Value Ref Range   Cholesterol 186 0 - 200 mg/dL   Triglycerides 128.0 0.0 - 149.0 mg/dL   HDL 39.50 >39.00 mg/dL   VLDL 25.6 0.0 - 40.0 mg/dL   LDL Cholesterol 121 (H) 0 - 99 mg/dL   Total CHOL/HDL Ratio 5    NonHDL 146.59   Comprehensive metabolic panel  Result Value Ref Range   Sodium 138 135 - 145 mEq/L   Potassium 4.2 3.5 - 5.1 mEq/L   Chloride 100 96 - 112 mEq/L   CO2 32 19 - 32 mEq/L   Glucose, Bld  118 (H) 70 - 99 mg/dL   BUN 20 6 - 23 mg/dL   Creatinine, Ser 0.74 0.40 - 1.50 mg/dL   Total Bilirubin 0.4 0.2 - 1.2 mg/dL   Alkaline Phosphatase 99 39 - 117 U/L   AST 43 (H) 0 - 37 U/L   ALT 58 (H) 0 - 53 U/L   Total Protein 7.2 6.0 - 8.3 g/dL   Albumin 3.9 3.5 - 5.2 g/dL   Calcium 9.0 8.4 - 10.5 mg/dL   GFR 109.30 >60.00 mL/min  Hemoglobin A1c  Result Value Ref Range   Hgb A1c MFr Bld 6.7 (H) 4.6 - 6.5 %  PSA  Result Value Ref Range   PSA 0.92 0.10 - 4.00 ng/mL  CBC with Differential/Platelet  Result Value Ref Range   WBC 6.7 4.0 - 10.5 K/uL   RBC 4.33 4.22 - 5.81 Mil/uL   Hemoglobin 13.3 13.0 - 17.0 g/dL   HCT 38.8 (L) 39.0 - 52.0 %   MCV 89.6 78.0 - 100.0 fl   MCHC 34.2 30.0 - 36.0 g/dL   RDW 15.0 11.5 - 15.5 %  Platelets 241.0 150.0 - 400.0 K/uL   Neutrophils Relative % 66.2 43.0 - 77.0 %   Lymphocytes Relative 19.6 12.0 - 46.0 %   Monocytes Relative 9.7 3.0 - 12.0 %   Eosinophils Relative 4.2 0.0 - 5.0 %   Basophils Relative 0.3 0.0 - 3.0 %   Neutro Abs 4.4 1.4 - 7.7 K/uL   Lymphs Abs 1.3 0.7 - 4.0 K/uL   Monocytes Absolute 0.6 0.1 - 1.0 K/uL   Eosinophils Absolute 0.3 0.0 - 0.7 K/uL   Basophils Absolute 0.0 0.0 - 0.1 K/uL      Assessment & Plan:   Problem List Items Addressed This Visit    Advanced care planning/counseling discussion    Advanced directives: scanned 04/07/2015. Would not want extended life support. Does want CPR and life support if deemed reversible/temporary. Wife then daughter are HCPOA.       Benign essential tremor    Followed by neurology - on gabapentin '300mg'$  TID dosing, notes persistent trouble likely from progressive worsening. To discuss other options with neurology Dr Manuella Ghazi.      Benign prostatic hyperplasia    Chronic, stable off meds.       COPD (chronic obstructive pulmonary disease) (HCC)    Not on meds. Consider updated spirometry.       Coronary artery disease involving native coronary artery of native heart without  angina pectoris    Consider aspirin, statin.       Relevant Medications   lisinopril (PRINIVIL,ZESTRIL) 10 MG tablet   metoprolol tartrate (LOPRESSOR) 25 MG tablet   Diet-controlled diabetes mellitus ([place])    New diagnosis reviewed with patient. Encouraged low carb diet, choosing complex carbs when chosen. Declines diabetes education at this time. RTC 6 mo f/u visit.       Relevant Medications   lisinopril (PRINIVIL,ZESTRIL) 10 MG tablet   Ex-smoker    Quit 02/2015 after lung cancer dx. Continues encouraging wife to quit.       Health maintenance examination - Primary    Preventative protocols reviewed and updated unless pt declined. Discussed healthy diet and lifestyle.       HTN (hypertension)    Chronic, stable. Continue current regimen.       Relevant Medications   lisinopril (PRINIVIL,ZESTRIL) 10 MG tablet   metoprolol tartrate (LOPRESSOR) 25 MG tablet   Malignant neoplasm of right upper lobe of lung ([place])    S/p radiation therapy and 4 chemo sessions 2016. followed by onc - appreciate care.       PAD (peripheral artery disease) (HCC)    Plaque in aortic arch and descending aorta by imaging Consider aspirin, statin.      Relevant Medications   lisinopril (PRINIVIL,ZESTRIL) 10 MG tablet   metoprolol tartrate (LOPRESSOR) 25 MG tablet   Peripheral neuropathy due to chemotherapy ([place])    Followed by neurology.       Relevant Medications   DULoxetine (CYMBALTA) 30 MG capsule   Primary cancer of bladder ([place])    S/p resection, followed by active surveillance cystoscopy Q10mothrough 03/2017.          Follow up plan: Return in about 6 months (around 12/30/2016) for follow up visit.  JRia Bush MD

## 2016-07-02 NOTE — Assessment & Plan Note (Signed)
New diagnosis reviewed with patient. Encouraged low carb diet, choosing complex carbs when chosen. Declines diabetes education at this time. RTC 6 mo f/u visit.

## 2016-07-02 NOTE — Assessment & Plan Note (Signed)
Chronic, stable off meds.

## 2016-07-05 ENCOUNTER — Other Ambulatory Visit: Payer: Self-pay | Admitting: *Deleted

## 2016-07-05 ENCOUNTER — Encounter: Payer: Self-pay | Admitting: *Deleted

## 2016-07-05 DIAGNOSIS — C3411 Malignant neoplasm of upper lobe, right bronchus or lung: Secondary | ICD-10-CM

## 2016-07-05 DIAGNOSIS — C3491 Malignant neoplasm of unspecified part of right bronchus or lung: Secondary | ICD-10-CM

## 2016-07-05 DIAGNOSIS — G620 Drug-induced polyneuropathy: Secondary | ICD-10-CM

## 2016-07-05 DIAGNOSIS — T451X5A Adverse effect of antineoplastic and immunosuppressive drugs, initial encounter: Secondary | ICD-10-CM

## 2016-07-05 MED ORDER — HYDROCODONE-ACETAMINOPHEN 5-325 MG PO TABS: 1.0000 | ORAL_TABLET | Freq: Three times a day (TID) | ORAL | 0 refills | Status: DC | PRN

## 2016-07-18 DIAGNOSIS — J449 Chronic obstructive pulmonary disease, unspecified: Secondary | ICD-10-CM | POA: Diagnosis not present

## 2016-07-24 ENCOUNTER — Ambulatory Visit
Admission: RE | Admit: 2016-07-24 | Discharge: 2016-07-24 | Disposition: A | Discharge status: Home / Self Care | Payer: Medicare Other | Source: Ambulatory Visit | Attending: Internal Medicine | Admitting: Internal Medicine

## 2016-07-24 DIAGNOSIS — C3411 Malignant neoplasm of upper lobe, right bronchus or lung: Secondary | ICD-10-CM | POA: Insufficient documentation

## 2016-07-24 DIAGNOSIS — R918 Other nonspecific abnormal finding of lung field: Secondary | ICD-10-CM | POA: Insufficient documentation

## 2016-07-24 DIAGNOSIS — K8020 Calculus of gallbladder without cholecystitis without obstruction: Secondary | ICD-10-CM | POA: Insufficient documentation

## 2016-07-24 DIAGNOSIS — K760 Fatty (change of) liver, not elsewhere classified: Secondary | ICD-10-CM | POA: Insufficient documentation

## 2016-07-24 DIAGNOSIS — I700 Atherosclerosis of aorta: Secondary | ICD-10-CM | POA: Insufficient documentation

## 2016-07-24 DIAGNOSIS — I2510 Atherosclerotic heart disease of native coronary artery without angina pectoris: Secondary | ICD-10-CM | POA: Insufficient documentation

## 2016-07-24 DIAGNOSIS — J432 Centrilobular emphysema: Secondary | ICD-10-CM | POA: Diagnosis not present

## 2016-07-24 MED ORDER — IOPAMIDOL (ISOVUE-300) INJECTION 61%
75.0000 mL | Freq: Once | INTRAVENOUS | Status: AC | PRN
  Administered 2016-07-24: 75 mL via INTRAVENOUS

## 2016-07-26 ENCOUNTER — Inpatient Hospital Stay: Payer: Medicare Other | Attending: Internal Medicine

## 2016-07-26 ENCOUNTER — Inpatient Hospital Stay (HOSPITAL_BASED_OUTPATIENT_CLINIC_OR_DEPARTMENT_OTHER): Payer: Medicare Other | Admitting: Internal Medicine

## 2016-07-26 VITALS — BP 107/57 | HR 75 | Temp 96.6°F | Wt 212.5 lb

## 2016-07-26 DIAGNOSIS — Z8049 Family history of malignant neoplasm of other genital organs: Secondary | ICD-10-CM | POA: Insufficient documentation

## 2016-07-26 DIAGNOSIS — G8929 Other chronic pain: Secondary | ICD-10-CM | POA: Diagnosis not present

## 2016-07-26 DIAGNOSIS — Z923 Personal history of irradiation: Secondary | ICD-10-CM

## 2016-07-26 DIAGNOSIS — F17210 Nicotine dependence, cigarettes, uncomplicated: Secondary | ICD-10-CM | POA: Diagnosis not present

## 2016-07-26 DIAGNOSIS — I2510 Atherosclerotic heart disease of native coronary artery without angina pectoris: Secondary | ICD-10-CM | POA: Insufficient documentation

## 2016-07-26 DIAGNOSIS — Z86010 Personal history of colonic polyps: Secondary | ICD-10-CM | POA: Insufficient documentation

## 2016-07-26 DIAGNOSIS — M545 Low back pain: Secondary | ICD-10-CM | POA: Diagnosis not present

## 2016-07-26 DIAGNOSIS — E785 Hyperlipidemia, unspecified: Secondary | ICD-10-CM

## 2016-07-26 DIAGNOSIS — Z79899 Other long term (current) drug therapy: Secondary | ICD-10-CM | POA: Insufficient documentation

## 2016-07-26 DIAGNOSIS — R918 Other nonspecific abnormal finding of lung field: Secondary | ICD-10-CM | POA: Insufficient documentation

## 2016-07-26 DIAGNOSIS — G629 Polyneuropathy, unspecified: Secondary | ICD-10-CM | POA: Insufficient documentation

## 2016-07-26 DIAGNOSIS — I10 Essential (primary) hypertension: Secondary | ICD-10-CM | POA: Insufficient documentation

## 2016-07-26 DIAGNOSIS — K219 Gastro-esophageal reflux disease without esophagitis: Secondary | ICD-10-CM | POA: Insufficient documentation

## 2016-07-26 DIAGNOSIS — C3411 Malignant neoplasm of upper lobe, right bronchus or lung: Secondary | ICD-10-CM | POA: Diagnosis not present

## 2016-07-26 DIAGNOSIS — Z9221 Personal history of antineoplastic chemotherapy: Secondary | ICD-10-CM | POA: Insufficient documentation

## 2016-07-26 DIAGNOSIS — F1011 Alcohol abuse, in remission: Secondary | ICD-10-CM

## 2016-07-26 DIAGNOSIS — Z8551 Personal history of malignant neoplasm of bladder: Secondary | ICD-10-CM

## 2016-07-26 DIAGNOSIS — M7989 Other specified soft tissue disorders: Secondary | ICD-10-CM

## 2016-07-26 DIAGNOSIS — R0602 Shortness of breath: Secondary | ICD-10-CM

## 2016-07-26 DIAGNOSIS — Z8052 Family history of malignant neoplasm of bladder: Secondary | ICD-10-CM | POA: Insufficient documentation

## 2016-07-26 DIAGNOSIS — G250 Essential tremor: Secondary | ICD-10-CM | POA: Diagnosis not present

## 2016-07-26 DIAGNOSIS — J449 Chronic obstructive pulmonary disease, unspecified: Secondary | ICD-10-CM | POA: Diagnosis not present

## 2016-07-26 DIAGNOSIS — C3490 Malignant neoplasm of unspecified part of unspecified bronchus or lung: Secondary | ICD-10-CM

## 2016-07-26 LAB — CBC WITH DIFFERENTIAL/PLATELET
BASOS ABS: 0.1 10*3/uL (ref 0–0.1)
Basophils Relative: 1 %
Eosinophils Absolute: 0.3 10*3/uL (ref 0–0.7)
Eosinophils Relative: 6 %
HEMATOCRIT: 39.1 % — AB (ref 40.0–52.0)
HEMOGLOBIN: 13.2 g/dL (ref 13.0–18.0)
LYMPHS ABS: 1.1 10*3/uL (ref 1.0–3.6)
LYMPHS PCT: 20 %
MCH: 30.1 pg (ref 26.0–34.0)
MCHC: 33.7 g/dL (ref 32.0–36.0)
MCV: 89.4 fL (ref 80.0–100.0)
Monocytes Absolute: 0.5 10*3/uL (ref 0.2–1.0)
Monocytes Relative: 10 %
NEUTROS ABS: 3.4 10*3/uL (ref 1.4–6.5)
NEUTROS PCT: 63 %
Platelets: 279 10*3/uL (ref 150–440)
RBC: 4.38 MIL/uL — AB (ref 4.40–5.90)
RDW: 14.9 % — ABNORMAL HIGH (ref 11.5–14.5)
WBC: 5.4 10*3/uL (ref 3.8–10.6)

## 2016-07-26 LAB — COMPREHENSIVE METABOLIC PANEL
ALK PHOS: 112 U/L (ref 38–126)
ALT: 41 U/L (ref 17–63)
AST: 43 U/L — AB (ref 15–41)
Albumin: 3.8 g/dL (ref 3.5–5.0)
Anion gap: 6 (ref 5–15)
BUN: 20 mg/dL (ref 6–20)
CALCIUM: 8.7 mg/dL — AB (ref 8.9–10.3)
CHLORIDE: 101 mmol/L (ref 101–111)
CO2: 27 mmol/L (ref 22–32)
CREATININE: 0.84 mg/dL (ref 0.61–1.24)
Glucose, Bld: 190 mg/dL — ABNORMAL HIGH (ref 65–99)
Potassium: 4.2 mmol/L (ref 3.5–5.1)
Sodium: 134 mmol/L — ABNORMAL LOW (ref 135–145)
Total Bilirubin: 0.6 mg/dL (ref 0.3–1.2)
Total Protein: 7.8 g/dL (ref 6.5–8.1)

## 2016-07-26 NOTE — Assessment & Plan Note (Addendum)
#   Right UL POORLY DIFF CA- T3 N0  Currently status post radiation [finished feb 2017];  Finished adjuvant chemotherapy [end of April 2017] FEB 13th 2018-  CT scan shows continued partial response; stable RUL/ RLL- stable. RML- nodule slightly enlarged  Currently appx 1m [see discussion below]  # right middle lobe lung nodule Discussed that this most likely malignancy at this slowly growing over time. Too small to be biopsied. [Patient reluctant with the biopsy given the history of pneumothorax]. Recommend monitoring for now; recommend SBR T/ if it continues to grow.  # Bil LE swelling/weight gain-Improved/stable.   # Peripheral neuropathy-improving.  Every Week [facility] hydrocodone q 12  however he wants to taper off the hydrocodone as the pain gets better. Neurontin 300 BID; follows up with neurology; Dr.Shah.   # Follow up in 4 months/Ct scan prior.   # I reviewed the blood work- with the patient in detail; also reviewed the imaging independently [as summarized above]; and with the patient in detail.

## 2016-07-26 NOTE — Progress Notes (Signed)
Patient here today for follow up.  Patient states no new concerns today  

## 2016-07-26 NOTE — Progress Notes (Signed)
[facility] NOTE  Patient Care Team: [name], MD as PCP - General (Family Medicine) Cammie Sickle, MD as Consulting Physician (Internal Medicine) [name], MD as Consulting Physician (Urology) Vladimir Crofts, MD as Consulting Physician (Neurology) Minna Merritts, MD as Consulting Physician (Cardiology) Noreene Filbert, MD as Referring Physician (Radiation Oncology) [name], MD as Referring Physician (Cardiothoracic Surgery)  CHIEF COMPLAINTS/PURPOSE OF CONSULTATION:   Oncology History    # OCT 2016- RUL POORLY DIFF CA [clinically non-small cell] ; STAGE IIB [T3-2 separate nodules in RUL; N0] [s/p CT guided bx]- s/p RT [finished mid jan 2017]; FEB 2017-CT scan- regression of RUL nodules;   # March 2017- Start carbo-taxol q 3 w x4; June 2017- CT- Improvement of RUL nodule/ Stable RUL nodules. OCT 2017- PR   # OCT 2016- 2 sub cm nodules in Right LL- Feb CT 2017- STABLE; June 2017- resolved  # May- 2017- G2 PN  # OCT 2016-NON-invasive bladder urothelial cancer; high grade [Dr. [name]; s/p TURBT [Feb 2017- NEG]; declined BCG  # Pneumothorax [oct 2016 s/p CT Bx];  # MOLECULAR STUDIES- PDL-1 by IHC/keytruda- 11-20%.        Malignant neoplasm of right upper lobe of lung ([place])   12/20/2015 Initial Diagnosis    Malignant neoplasm of right upper lobe of lung (HCC)         HISTORY OF PRESENTING ILLNESS:  Joseph Graham 76 y.o. male with above history of long-standing smoking/COPD-  Non-small cell/ poorly differentiated malignancy of the right upper lobe  T3 N0 status post radiation; followed by adjuvant chemotherapy with [place] Taxol 4 cycles; Is here for follow-up of his bilateral extremity/ Neuropathy; and also to review the results of his restaging CAT scan.  Patient states his neuropathy is improving. He is taking hydrocodone at most twice a day. His leg swelling is improved. Denies any unusual shortness of breath or  cough.   ROS: A complete 10 point review of system is done which is negative for mentioned above in history of present illness.  MEDICAL HISTORY:  Past Medical History:  Diagnosis Date  . Alcohol abuse, in remission    remote  . Alcohol abuse, in remission    remote   . Arthritis    BACK AND NECK  . Benign essential tremor    improved on metoprolol and gabapentin  . BPH (benign prostatic hypertrophy) 12/30/2012  . Cardiac arrhythmia 03/2010   h/o a flutter and CM, s/p ablation, normal stress test  . Chronic low back pain    MRI 2004, multilevel DDD  . Colon polyps    next colonoscopy due around 2018  . Complication of anesthesia    DID NOT GET COMPLETELY NUMB WITH TONSILLECTOMY  . COPD (chronic obstructive pulmonary disease) ([place])   . Coronary artery disease   . Dyslipidemia    mild off meds  . GERD (gastroesophageal reflux disease)   . HTN (hypertension)   . Multiple pulmonary nodules 02/2015  . Neuromuscular disorder ([place])    neuropathy from chemo....in feet  . Peripheral neuropathy due to chemotherapy ([place]) 12/23/2015   Saw Dr Manuella Ghazi, on gabapentin and cymbalta with hydrocodone PRN (03/2016)  . Personal history of tobacco use, presenting hazards to health 02/18/2015   quit 06/2011, restarted 2014  . Primary cancer of bladder ([place]) 2016   Budzyn  . Primary lung cancer ([place]) 2016   port a cath removed 03/2016  . Shortness of breath dyspnea  OCC WITH EXERTION    SURGICAL HISTORY: Past Surgical History:  Procedure Laterality Date  . A FLUTTER ABLATION  03/2010  . carotid US  03/2010   WNL  . CATARACT EXTRACTION    . COLONOSCOPY  09/2011   polyps, rpt due 5 yrs, mild diverticulosis [name])  . CYSTOSCOPY W/ RETROGRADES Bilateral 04/06/2015   Procedure: CYSTOSCOPY WITH RETROGRADE PYELOGRAM;  Surgeon: [name], MD;  Location: [facility];  Service: Urology;  Laterality: Bilateral;  . ELECTROMAGNETIC NAVIGATION BROCHOSCOPY N/A 03/02/2015   Procedure:  ELECTROMAGNETIC NAVIGATION BRONCHOSCOPY;  Surgeon: [name], MD;  Location: [facility];  Service: Cardiopulmonary;  Laterality: N/A;  . [place]  . PORT-A-CATH REMOVAL Right 04/03/2016   Procedure: REMOVAL PORT-A-CATH;  Surgeon: [name], MD;  Location: [facility];  Service: General;  Laterality: Right;  . PORTACATH PLACEMENT N/A 08/10/2015   Procedure: INSERTION PORT-A-CATH;  Surgeon: [name], MD;  Location: [facility];  Service: General;  Laterality: N/A;  . [place]  . TRANSURETHRAL RESECTION OF BLADDER TUMOR N/A 04/06/2015   Procedure: TRANSURETHRAL RESECTION OF BLADDER TUMOR (TURBT) right ureteral stent placement;  Surgeon: [name], MD;  Location: [facility];  Service: Urology;  Laterality: N/A;  . TRANSURETHRAL RESECTION OF BLADDER TUMOR N/A 07/13/2015   Procedure: TRANSURETHRAL RESECTION OF BLADDER TUMOR (TURBT);  Surgeon: [name], MD;  Location: [facility];  Service: Urology;  Laterality: N/A;  . urinary stent removal  04/14/15    SOCIAL HISTORY: Patient is retired from Press [employer]. He lives in [place]. Social History   Social History  . Marital status: Married    Spouse name: N/A  . Number of children: N/A  . Years of education: N/A   Occupational History  . Not on file.   Social History Main Topics  . Smoking status: Former Smoker    Packs/day: 1.50    Years: 61.00    Types: Cigarettes    Quit date: 03/08/2015  . Smokeless tobacco: Never Used     Comment: cut back 0.5 cigarettes--stopped 03/07/15; now now chantix  . Alcohol use No     Comment: has not drank in 37 years  . Drug use: No  . Sexual activity: No   Other Topics Concern  . Not on file   Social History Narrative   Caffeine: 1 cup decaf/day   Lives with wife   Occupation: retired, worked Press photographer   Activity: gym 3x/wk, Immunologist, hobbies (paint, building things)   Diet: healthy, good water, fruits/vegetables daily, red meat 1x/wk    FAMILY HISTORY: Family  History  Problem Relation Age of Onset  . Cervical cancer Mother 85  . Hypertension Father   . Tremor Father     and several uncles/aunts (not parkinson's)  . Stroke Brother 51  . Bladder Cancer Brother 77  . Prostate cancer Neg Hx   . Kidney cancer Neg Hx     ALLERGIES:  has No Known Allergies.  MEDICATIONS:  Current Outpatient Prescriptions  Medication Sig Dispense Refill  . Cyanocobalamin (B-12 PO) Take 1 tablet by mouth daily.    . DULoxetine (CYMBALTA) 30 MG capsule Take 30 mg by mouth 2 (two) times daily.    Marland Kitchen gabapentin (NEURONTIN) 300 MG capsule Take 1 capsule (300 mg total) by mouth 3 (three) times daily. 90 capsule 3  . HYDROcodone-acetaminophen (NORCO) 5-325 MG tablet Take 1 tablet by mouth every 8 (eight) hours as needed for moderate pain. 90 tablet 0  . lisinopril (PRINIVIL,ZESTRIL)  10 MG tablet Take 1 tablet (10 mg total) by mouth every evening. 90 tablet 3  . Magnesium Cl-Calcium Carbonate (SLOW-MAG PO) Take 1 tablet by mouth daily.    . metoprolol tartrate (LOPRESSOR) 25 MG tablet Take 0.5 tablets (12.5 mg total) by mouth 2 (two) times daily. 90 tablet 3  . Multiple Vitamin (MULTI-VITAMINS) TABS Take by mouth.    . polyethylene glycol (MIRALAX / GLYCOLAX) packet Take 17 g by mouth daily as needed for moderate constipation.    Marland Kitchen VITAMIN E PO Take 1 tablet by mouth daily.     No current facility-administered medications for this visit.   Marland Kitchen  PHYSICAL EXAMINATION: ECOG PERFORMANCE STATUS: 0 - Asymptomatic  Vitals:   07/26/16 1027  BP: (!) 107/57  Pulse: 75  Temp: (!) 96.6 F (35.9 C)   Filed Weights   07/26/16 1027  Weight: 212 lb 8 oz (96.4 kg)  Pulse ox 94% on room air.  GENERAL:alert, no distress and comfortable. He is Accompanied by his wife. SKIN: skin color, texture, turgor are normal, no rashes or significant lesions EYES: normal, conjunctiva are pink and non-injected, sclera clear OROPHARYNX:no exudate, no erythema and lips, buccal mucosa, and  tongue normal  NECK: supple, thyroid normal size, non-tender, without nodularity LYMPH:  no palpable lymphadenopathy in the cervical, axillary or inguinal LUNGS: clear to auscultation and percussion with normal breathing effort HEART: regular rate & rhythm and no murmurs and no edema. ABDOMEN:abdomen soft, non-tender and normal bowel sounds Musculoskeletal:no cyanosis of digits and no clubbing  PSYCH: alert & oriented x 3 with fluent speech NEURO: no focal motor/sensory deficits  LABORATORY DATA:  I have reviewed the data as listed Lab Results  Component Value Date   WBC 5.4 07/26/2016   HGB 13.2 07/26/2016   HCT 39.1 (L) 07/26/2016   MCV 89.4 07/26/2016   PLT 279 07/26/2016    Recent Labs  02/17/16 0851 03/22/16 1010 06/25/16 0901 07/26/16 0958  NA 136 138 138 134*  K 3.8 4.1 4.2 4.2  CL 103 103 100 101  CO2 25 25 32 27  GLUCOSE 148* 207* 118* 190*  BUN 21* '18 20 20  ' CREATININE 0.94 0.89 0.74 0.84  CALCIUM 8.7* 8.7* 9.0 8.7*  GFRNONAA >60 >60  --  >60  GFRAA >60 >60  --  >60  PROT  --  7.7 7.2 7.8  ALBUMIN  --  3.8 3.9 3.8  AST  --  55* 43* 43*  ALT  --  67* 58* 41  ALKPHOS  --  95 99 112  BILITOT  --  0.6 0.4 0.6    RADIOGRAPHIC STUDIES:  ASSESSMENT & PLAN:   Malignant neoplasm of right upper lobe of lung (HCC) # Right UL POORLY DIFF CA- T3 N0  Currently status post radiation [finished feb 2017];  Finished adjuvant chemotherapy [end of April 2017] FEB 13th 2018-  CT scan shows continued partial response; stable RUL/ RLL- stable. RML- nodule slightly enlarged  Currently appx 37m [see discussion below]  # right middle lobe lung nodule Discussed that this most likely malignancy at this slowly growing over time. Too small to be biopsied. [Patient reluctant with the biopsy given the history of pneumothorax]. Recommend monitoring for now; recommend SBR T/ if it continues to grow.  # Bil LE swelling/weight gain-Improved/stable.   # Peripheral neuropathy-improving.   Every Week He Again and Then Other Hospitaln hydrocodone q 12  however he wants to taper off the hydrocodone as the pain gets better.  Neurontin 300 BID; follows up with neurology; Dr.Shah.   # Follow up in 4 months/Ct scan prior.   # I reviewed the blood work- with the patient in detail; also reviewed the imaging independently [as summarized above]; and with the patient in detail.      Cammie Sickle, MD 07/26/2016 12:07 PM

## 2016-07-30 DIAGNOSIS — E663 Overweight: Secondary | ICD-10-CM | POA: Insufficient documentation

## 2016-07-30 DIAGNOSIS — E538 Deficiency of other specified B group vitamins: Secondary | ICD-10-CM | POA: Diagnosis not present

## 2016-07-30 DIAGNOSIS — R634 Abnormal weight loss: Secondary | ICD-10-CM | POA: Insufficient documentation

## 2016-07-30 DIAGNOSIS — Z6831 Body mass index (BMI) 31.0-31.9, adult: Secondary | ICD-10-CM

## 2016-07-30 DIAGNOSIS — G608 Other hereditary and idiopathic neuropathies: Secondary | ICD-10-CM | POA: Insufficient documentation

## 2016-07-30 DIAGNOSIS — E6609 Other obesity due to excess calories: Secondary | ICD-10-CM | POA: Insufficient documentation

## 2016-07-30 DIAGNOSIS — G250 Essential tremor: Secondary | ICD-10-CM | POA: Diagnosis not present

## 2016-08-10 ENCOUNTER — Other Ambulatory Visit: Payer: Self-pay | Admitting: *Deleted

## 2016-08-10 DIAGNOSIS — T451X5A Adverse effect of antineoplastic and immunosuppressive drugs, initial encounter: Secondary | ICD-10-CM

## 2016-08-10 DIAGNOSIS — C3491 Malignant neoplasm of unspecified part of right bronchus or lung: Secondary | ICD-10-CM

## 2016-08-10 DIAGNOSIS — G620 Drug-induced polyneuropathy: Secondary | ICD-10-CM

## 2016-08-10 DIAGNOSIS — C3411 Malignant neoplasm of upper lobe, right bronchus or lung: Secondary | ICD-10-CM

## 2016-08-10 MED ORDER — HYDROCODONE-ACETAMINOPHEN 5-325 MG PO TABS: 1.0000 | ORAL_TABLET | Freq: Three times a day (TID) | ORAL | 0 refills | Status: DC | PRN

## 2016-08-29 ENCOUNTER — Encounter: Payer: Self-pay | Admitting: Urology

## 2016-08-29 ENCOUNTER — Ambulatory Visit: Payer: Medicare Other | Admitting: Urology

## 2016-08-29 VITALS — BP 124/73 | HR 73 | Ht 69.0 in | Wt 216.1 lb

## 2016-08-29 DIAGNOSIS — C679 Malignant neoplasm of bladder, unspecified: Secondary | ICD-10-CM | POA: Diagnosis not present

## 2016-08-29 LAB — URINALYSIS, COMPLETE
Bilirubin, UA: NEGATIVE
GLUCOSE, UA: NEGATIVE
Ketones, UA: NEGATIVE
Leukocytes, UA: NEGATIVE
NITRITE UA: NEGATIVE
PH UA: 5.5 (ref 5.0–7.5)
Protein, UA: NEGATIVE
Specific Gravity, UA: 1.025 (ref 1.005–1.030)
UUROB: 0.2 mg/dL (ref 0.2–1.0)

## 2016-08-29 LAB — MICROSCOPIC EXAMINATION: Bacteria, UA: NONE SEEN

## 2016-08-29 MED ORDER — LIDOCAINE HCL 2 % EX GEL
1.0000 "application " | Freq: Once | CUTANEOUS | Status: AC
  Administered 2016-08-29: 1 via URETHRAL

## 2016-08-29 MED ORDER — CIPROFLOXACIN HCL 500 MG PO TABS
500.0000 mg | ORAL_TABLET | Freq: Once | ORAL | Status: AC
  Administered 2016-08-29: 500 mg via ORAL

## 2016-08-29 NOTE — Progress Notes (Signed)
   08/29/16  CC:  Chief Complaint  Patient presents with  . Cysto    bladder cancer     HPI: The patient is a 76 year old gentleman with a past medical history of high-grade 3 cm Ta bladder cancer with no muscle specimen. This was originally diagnosed in October 2016. He underwent reresection in February 2017 which showed no evidence of disease. He has not undergone intravesical immunotherapy because he had been undergoing Taxol-based chemotherapy for a primary lung cancer after his first resection. He ended up not undergoing BCG due to his lung cancer. He has had no evidence of recurrence since primary resection.  Blood pressure 124/73, pulse 73, height '5\' 9"'$  (1.753 m), weight 216 lb 1.6 oz (98 kg). NED. A&Ox3.   No respiratory distress   Abd soft, NT, ND Normal phallus with bilateral descended testicles  Cystoscopy Procedure Note  Patient identification was confirmed, informed consent was obtained, and patient was prepped using Betadine solution.  Lidocaine jelly was administered per urethral meatus.    Preoperative abx where received prior to procedure.     Pre-Procedure: - Inspection reveals a normal caliber ureteral meatus.  Procedure: The flexible cystoscope was introduced without difficulty - No urethral strictures/lesions are present. - Enlarged prostate  - Normal bladder neck - Bilateral ureteral orifices identified - Bladder mucosa  reveals no ulcers, tumors, or lesions - No bladder stones - No trabeculation  Retroflexion shows no intravesical lobe   Post-Procedure: - Patient tolerated the procedure well  Assessment/ Plan:  1. High-grade pTa TCC of the bladder -No evidence of disease. Continue q36monthcystoscopy until October 2018. Can increase interval at that time if he continues to be disease free.

## 2016-09-25 ENCOUNTER — Other Ambulatory Visit: Payer: Self-pay | Admitting: *Deleted

## 2016-09-25 DIAGNOSIS — C3411 Malignant neoplasm of upper lobe, right bronchus or lung: Secondary | ICD-10-CM

## 2016-09-25 DIAGNOSIS — T451X5A Adverse effect of antineoplastic and immunosuppressive drugs, initial encounter: Secondary | ICD-10-CM

## 2016-09-25 DIAGNOSIS — C3491 Malignant neoplasm of unspecified part of right bronchus or lung: Secondary | ICD-10-CM

## 2016-09-25 DIAGNOSIS — G620 Drug-induced polyneuropathy: Secondary | ICD-10-CM

## 2016-09-25 MED ORDER — HYDROCODONE-ACETAMINOPHEN 5-325 MG PO TABS: 1.0000 | ORAL_TABLET | Freq: Three times a day (TID) | ORAL | 0 refills | Status: DC | PRN

## 2016-10-29 DIAGNOSIS — E538 Deficiency of other specified B group vitamins: Secondary | ICD-10-CM | POA: Diagnosis not present

## 2016-10-29 DIAGNOSIS — G250 Essential tremor: Secondary | ICD-10-CM | POA: Diagnosis not present

## 2016-10-30 ENCOUNTER — Encounter: Payer: Self-pay | Admitting: Internal Medicine

## 2016-11-06 ENCOUNTER — Other Ambulatory Visit: Payer: Self-pay | Admitting: *Deleted

## 2016-11-06 DIAGNOSIS — T451X5A Adverse effect of antineoplastic and immunosuppressive drugs, initial encounter: Secondary | ICD-10-CM

## 2016-11-06 DIAGNOSIS — C3411 Malignant neoplasm of upper lobe, right bronchus or lung: Secondary | ICD-10-CM

## 2016-11-06 DIAGNOSIS — C3491 Malignant neoplasm of unspecified part of right bronchus or lung: Secondary | ICD-10-CM

## 2016-11-06 DIAGNOSIS — G620 Drug-induced polyneuropathy: Secondary | ICD-10-CM

## 2016-11-07 MED ORDER — HYDROCODONE-ACETAMINOPHEN 5-325 MG PO TABS: 1.0000 | ORAL_TABLET | Freq: Three times a day (TID) | ORAL | 0 refills | Status: DC | PRN

## 2016-11-09 ENCOUNTER — Ambulatory Visit: Payer: Medicare Other | Admitting: Radiation Oncology

## 2016-11-21 ENCOUNTER — Ambulatory Visit
Admission: RE | Admit: 2016-11-21 | Discharge: 2016-11-21 | Disposition: A | Discharge status: Home / Self Care | Payer: Medicare Other | Source: Ambulatory Visit | Attending: Internal Medicine | Admitting: Internal Medicine

## 2016-11-21 DIAGNOSIS — K8020 Calculus of gallbladder without cholecystitis without obstruction: Secondary | ICD-10-CM | POA: Insufficient documentation

## 2016-11-21 DIAGNOSIS — I700 Atherosclerosis of aorta: Secondary | ICD-10-CM | POA: Insufficient documentation

## 2016-11-21 DIAGNOSIS — I2510 Atherosclerotic heart disease of native coronary artery without angina pectoris: Secondary | ICD-10-CM | POA: Diagnosis not present

## 2016-11-21 DIAGNOSIS — J439 Emphysema, unspecified: Secondary | ICD-10-CM | POA: Diagnosis not present

## 2016-11-21 DIAGNOSIS — R0989 Other specified symptoms and signs involving the circulatory and respiratory systems: Secondary | ICD-10-CM | POA: Diagnosis not present

## 2016-11-21 DIAGNOSIS — C3411 Malignant neoplasm of upper lobe, right bronchus or lung: Secondary | ICD-10-CM | POA: Insufficient documentation

## 2016-11-21 LAB — POCT I-STAT CREATININE: Creatinine, Ser: 0.8 mg/dL (ref 0.61–1.24)

## 2016-11-21 MED ORDER — IOPAMIDOL (ISOVUE-300) INJECTION 61%
75.0000 mL | Freq: Once | INTRAVENOUS | Status: AC | PRN
  Administered 2016-11-21: 75 mL via INTRAVENOUS

## 2016-11-23 ENCOUNTER — Encounter: Payer: Self-pay | Admitting: Hematology and Oncology

## 2016-11-23 ENCOUNTER — Other Ambulatory Visit: Payer: Self-pay

## 2016-11-23 ENCOUNTER — Inpatient Hospital Stay: Payer: Medicare Other | Attending: Hematology and Oncology

## 2016-11-23 ENCOUNTER — Inpatient Hospital Stay (HOSPITAL_BASED_OUTPATIENT_CLINIC_OR_DEPARTMENT_OTHER): Payer: Medicare Other | Admitting: Hematology and Oncology

## 2016-11-23 VITALS — BP 151/70 | HR 73 | Temp 97.9°F | Resp 18 | Wt 216.2 lb

## 2016-11-23 DIAGNOSIS — F1011 Alcohol abuse, in remission: Secondary | ICD-10-CM | POA: Diagnosis not present

## 2016-11-23 DIAGNOSIS — I10 Essential (primary) hypertension: Secondary | ICD-10-CM | POA: Diagnosis not present

## 2016-11-23 DIAGNOSIS — J449 Chronic obstructive pulmonary disease, unspecified: Secondary | ICD-10-CM | POA: Insufficient documentation

## 2016-11-23 DIAGNOSIS — Z86010 Personal history of colonic polyps: Secondary | ICD-10-CM

## 2016-11-23 DIAGNOSIS — Z87891 Personal history of nicotine dependence: Secondary | ICD-10-CM

## 2016-11-23 DIAGNOSIS — C3411 Malignant neoplasm of upper lobe, right bronchus or lung: Secondary | ICD-10-CM | POA: Diagnosis not present

## 2016-11-23 DIAGNOSIS — G8929 Other chronic pain: Secondary | ICD-10-CM | POA: Diagnosis not present

## 2016-11-23 DIAGNOSIS — G629 Polyneuropathy, unspecified: Secondary | ICD-10-CM | POA: Insufficient documentation

## 2016-11-23 DIAGNOSIS — Z9221 Personal history of antineoplastic chemotherapy: Secondary | ICD-10-CM | POA: Diagnosis not present

## 2016-11-23 DIAGNOSIS — Z8551 Personal history of malignant neoplasm of bladder: Secondary | ICD-10-CM | POA: Insufficient documentation

## 2016-11-23 DIAGNOSIS — M7989 Other specified soft tissue disorders: Secondary | ICD-10-CM | POA: Diagnosis not present

## 2016-11-23 DIAGNOSIS — G250 Essential tremor: Secondary | ICD-10-CM

## 2016-11-23 DIAGNOSIS — Z923 Personal history of irradiation: Secondary | ICD-10-CM

## 2016-11-23 DIAGNOSIS — I2510 Atherosclerotic heart disease of native coronary artery without angina pectoris: Secondary | ICD-10-CM | POA: Insufficient documentation

## 2016-11-23 DIAGNOSIS — K219 Gastro-esophageal reflux disease without esophagitis: Secondary | ICD-10-CM | POA: Insufficient documentation

## 2016-11-23 DIAGNOSIS — N400 Enlarged prostate without lower urinary tract symptoms: Secondary | ICD-10-CM | POA: Diagnosis not present

## 2016-11-23 DIAGNOSIS — M545 Low back pain: Secondary | ICD-10-CM | POA: Diagnosis not present

## 2016-11-23 DIAGNOSIS — E785 Hyperlipidemia, unspecified: Secondary | ICD-10-CM

## 2016-11-23 LAB — CBC WITH DIFFERENTIAL/PLATELET
BASOS ABS: 0 10*3/uL (ref 0–0.1)
Basophils Relative: 1 %
EOS PCT: 3 %
Eosinophils Absolute: 0.2 10*3/uL (ref 0–0.7)
HCT: 38.9 % — ABNORMAL LOW (ref 40.0–52.0)
HEMOGLOBIN: 13.1 g/dL (ref 13.0–18.0)
LYMPHS ABS: 1.6 10*3/uL (ref 1.0–3.6)
LYMPHS PCT: 25 %
MCH: 29.6 pg (ref 26.0–34.0)
MCHC: 33.7 g/dL (ref 32.0–36.0)
MCV: 88.1 fL (ref 80.0–100.0)
Monocytes Absolute: 0.7 10*3/uL (ref 0.2–1.0)
Monocytes Relative: 11 %
NEUTROS ABS: 3.9 10*3/uL (ref 1.4–6.5)
NEUTROS PCT: 60 %
PLATELETS: 248 10*3/uL (ref 150–440)
RBC: 4.42 MIL/uL (ref 4.40–5.90)
RDW: 16.1 % — ABNORMAL HIGH (ref 11.5–14.5)
WBC: 6.4 10*3/uL (ref 3.8–10.6)

## 2016-11-23 LAB — BASIC METABOLIC PANEL
ANION GAP: 7 (ref 5–15)
BUN: 16 mg/dL (ref 6–20)
CHLORIDE: 96 mmol/L — AB (ref 101–111)
CO2: 32 mmol/L (ref 22–32)
Calcium: 8.7 mg/dL — ABNORMAL LOW (ref 8.9–10.3)
Creatinine, Ser: 0.81 mg/dL (ref 0.61–1.24)
Glucose, Bld: 132 mg/dL — ABNORMAL HIGH (ref 65–99)
POTASSIUM: 4.5 mmol/L (ref 3.5–5.1)
SODIUM: 135 mmol/L (ref 135–145)

## 2016-11-23 NOTE — Progress Notes (Signed)
Patient here today for CT results.  Patient of Dr. Jacinto Reap.

## 2016-11-23 NOTE — Progress Notes (Signed)
[facility] NOTE  Patient Care Team: [name], MD as PCP - General (Family Medicine) Cammie Sickle, MD as Consulting Physician (Internal Medicine) [name], MD as Consulting Physician (Urology) Vladimir Crofts, MD as Consulting Physician (Neurology) Minna Merritts, MD as Consulting Physician (Cardiology) Noreene Filbert, MD as Referring Physician (Radiation Oncology) [name], MD as Referring Physician (Cardiothoracic Surgery)  CHIEF COMPLAINTS/PURPOSE OF CONSULTATION:   Oncology History    # OCT 2016- RUL POORLY DIFF CA [clinically non-small cell] ; STAGE IIB [T3-2 separate nodules in RUL; N0] [s/p CT guided bx]- s/p RT [finished mid jan 2017]; FEB 2017-CT scan- regression of RUL nodules;   # March 2017- Start carbo-taxol q 3 w x4; June 2017- CT- Improvement of RUL nodule/ Stable RUL nodules. OCT 2017- PR   # OCT 2016- 2 sub cm nodules in Right LL- Feb CT 2017- STABLE; June 2017- resolved  # May- 2017- G2 PN  # OCT 2016-NON-invasive bladder urothelial cancer; high grade [Dr. [name]; s/p TURBT [Feb 2017- NEG]; declined BCG  # Pneumothorax [oct 2016 s/p CT Bx];  # MOLECULAR STUDIES- PDL-1 by IHC/keytruda- 11-20%.        Malignant neoplasm of right upper lobe of lung ([place])   12/20/2015 Initial Diagnosis    Malignant neoplasm of right upper lobe of lung (HCC)         HISTORY OF PRESENTING ILLNESS:  Joseph Graham 76 y.o. male with above history of long-standing smoking/COPD-  Non-small cell/ poorly differentiated malignancy of the right upper lobe  T3 N0 status post radiation; followed by adjuvant chemotherapy with [place] Taxol 4 cycles.  He is here for 4 month assessment and review of interval CT scan.  The patient was last seen by Dr. Rogue Bussing on 07/26/2016.  At that time, his peripheral neuropathy had improved.  Bilateral lower extremity swelling had improved.  The RML nodule was slowly growing, but too small  for biopsy.  During the interim, he has felt "pretty good".  Regarding his neuropathy, he is on 3 medications.  He nots no improvement in his neuropathy.  He is unable to wean off gabapentin.  He is eating less.  Chest CT on 11/21/2016 revealed similar bilateral point [facility] nodules. There was no evidence of new or progressive disease. There was no thoracic adenopathy. He has emphysema.  There was probable hepatic steatosis.   ROS: A complete 10 point review of system is done which is negative for mentioned above in history of present illness.  MEDICAL HISTORY:  Past Medical History:  Diagnosis Date  . Alcohol abuse, in remission    remote  . Alcohol abuse, in remission    remote   . Arthritis    BACK AND NECK  . Benign essential tremor    improved on metoprolol and gabapentin  . BPH (benign prostatic hypertrophy) 12/30/2012  . Cardiac arrhythmia 03/2010   h/o a flutter and CM, s/p ablation, normal stress test  . Chronic low back pain    MRI 2004, multilevel DDD  . Colon polyps    next colonoscopy due around 2018  . Complication of anesthesia    DID NOT GET COMPLETELY NUMB WITH TONSILLECTOMY  . COPD (chronic obstructive pulmonary disease) ([place])   . Coronary artery disease   . Dyslipidemia    mild off meds  . GERD (gastroesophageal reflux disease)   . HTN (hypertension)   . Multiple pulmonary nodules 02/2015  . Neuromuscular disorder ([place])  neuropathy from chemo....in feet  . Peripheral neuropathy due to chemotherapy ([place]) 12/23/2015   Saw Dr Manuella Ghazi, on gabapentin and cymbalta with hydrocodone PRN (03/2016)  . Personal history of tobacco use, presenting hazards to health 02/18/2015   quit 06/2011, restarted 2014  . Primary cancer of bladder ([place]) 2016   [place]  . Primary lung cancer ([place]) 2016   port a cath removed 03/2016  . Shortness of breath dyspnea    OCC WITH EXERTION    SURGICAL HISTORY: Past Surgical History:  Procedure Laterality Date  . A FLUTTER ABLATION   03/2010  . carotid US  03/2010   WNL  . CATARACT EXTRACTION    . COLONOSCOPY  09/2011   polyps, rpt due 5 yrs, mild diverticulosis [name])  . CYSTOSCOPY W/ RETROGRADES Bilateral 04/06/2015   Procedure: CYSTOSCOPY WITH RETROGRADE PYELOGRAM;  Surgeon: [name], MD;  Location: [facility];  Service: Urology;  Laterality: Bilateral;  . ELECTROMAGNETIC NAVIGATION BROCHOSCOPY N/A 03/02/2015   Procedure: ELECTROMAGNETIC NAVIGATION BRONCHOSCOPY;  Surgeon: [name], MD;  Location: [facility];  Service: Cardiopulmonary;  Laterality: N/A;  . [place]  . PORT-A-CATH REMOVAL Right 04/03/2016   Procedure: REMOVAL PORT-A-CATH;  Surgeon: [name], MD;  Location: [facility];  Service: General;  Laterality: Right;  . PORTACATH PLACEMENT N/A 08/10/2015   Procedure: INSERTION PORT-A-CATH;  Surgeon: [name], MD;  Location: [facility];  Service: General;  Laterality: N/A;  . [place]  . TRANSURETHRAL RESECTION OF BLADDER TUMOR N/A 04/06/2015   Procedure: TRANSURETHRAL RESECTION OF BLADDER TUMOR (TURBT) right ureteral stent placement;  Surgeon: [name], MD;  Location: [facility];  Service: Urology;  Laterality: N/A;  . TRANSURETHRAL RESECTION OF BLADDER TUMOR N/A 07/13/2015   Procedure: TRANSURETHRAL RESECTION OF BLADDER TUMOR (TURBT);  Surgeon: [name], MD;  Location: [facility];  Service: Urology;  Laterality: N/A;  . 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 Social History   Social History  . Marital status: Married    Spouse name: N/A  . Number of children: N/A  . Years of education: N/A   Occupational History  . Not on file.   Social History Main Topics  . Smoking status: Former Smoker    Packs/day: 1.50    Years: 61.00    Types: Cigarettes    Quit date: 03/08/2015  . Smokeless tobacco: Never Used     Comment: cut back 0.5 cigarettes--stopped 03/07/15; now now chantix  .  Alcohol use No     Comment: has not drank in 37 years  . Drug use: No  . Sexual activity: No   Other Topics Concern  . Not on file   Social History Narrative   Caffeine: 1 cup decaf/day   Lives with wife   Occupation: retired, worked Press photographer   Activity: gym 3x/wk, Immunologist, hobbies (paint, building things)   Diet: healthy, good water, fruits/vegetables daily, red meat 1x/wk    FAMILY HISTORY: Family History  Problem Relation Age of Onset  . Cervical cancer Mother 87  . Hypertension Father   . Tremor Father        and several uncles/aunts (not parkinson's)  . Stroke Brother 63  . Bladder Cancer Brother 68  . Prostate cancer Neg Hx   . Kidney cancer Neg Hx     ALLERGIES:  has No Known Allergies.  MEDICATIONS:  Current Outpatient Prescriptions  Medication Sig Dispense Refill  .  Cyanocobalamin (B-12 PO) Take 1 tablet by mouth daily.    . DULoxetine (CYMBALTA) 30 MG capsule Take 30 mg by mouth 2 (two) times daily.    Marland Kitchen gabapentin (NEURONTIN) 300 MG capsule Take 1 capsule (300 mg total) by mouth 3 (three) times daily. 90 capsule 3  . HYDROcodone-acetaminophen (NORCO) 5-325 MG tablet Take 1 tablet by mouth every 8 (eight) hours as needed for moderate pain. 90 tablet 0  . lisinopril (PRINIVIL,ZESTRIL) 10 MG tablet Take 1 tablet (10 mg total) by mouth every evening. 90 tablet 3  . Magnesium Cl-Calcium Carbonate (SLOW-MAG PO) Take 1 tablet by mouth daily.    . metoprolol tartrate (LOPRESSOR) 25 MG tablet Take 0.5 tablets (12.5 mg total) by mouth 2 (two) times daily. 90 tablet 3  . Multiple Vitamin (MULTI-VITAMINS) TABS Take by mouth.    . polyethylene glycol (MIRALAX / GLYCOLAX) packet Take 17 g by mouth daily as needed for moderate constipation.    . primidone (MYSOLINE) 50 MG tablet Take 50 mg by mouth 2 (two) times daily.  1  . VITAMIN E PO Take 1 tablet by mouth daily.     No current facility-administered medications for this visit.   Marland Kitchen  PHYSICAL EXAMINATION: ECOG  PERFORMANCE STATUS: 0 - Asymptomatic  Vitals:   11/23/16 1123  BP: (!) 151/70  Pulse: 73  Resp: 18  Temp: 97.9 F (36.6 C)   Filed Weights   11/23/16 1123  Weight: 216 lb 3 oz (98.1 kg)  Pulse ox 94% on room air.  GENERAL:  Well developed, well nourished, gentleman sitting comfortably in the exam room in no acute distress. MENTAL STATUS:  Alert and oriented to person, place and time. HEAD:  Pearline Cables hair.  Goatee.  Normocephalic, atraumatic, face symmetric, no Cushingoid features. EYES:  Glasses.  Blue eyes.  Pupils equal round and reactive to light and accomodation.  No conjunctivitis or scleral icterus. ENT:  Oropharynx clear without lesion.  Tongue normal. Mucous membranes moist.  RESPIRATORY:  Clear to auscultation without rales, wheezes or rhonchi. CARDIOVASCULAR:  Regular rate and rhythm without murmur, rub or gallop. ABDOMEN:  Soft, non-tender, with active bowel sounds, and no hepatosplenomegaly.  No masses. SKIN:  No rashes, ulcers or lesions. EXTREMITIES: Chronic lower extremity changes.  No skin discoloration or tenderness.  No palpable cords. LYMPH NODES: No palpable cervical, supraclavicular, axillary or inguinal adenopathy  NEUROLOGICAL: Unremarkable. PSYCH:  Appropriate.    LABORATORY DATA:  I have reviewed the data as listed Lab Results  Component Value Date   WBC 6.4 11/23/2016   HGB 13.1 11/23/2016   HCT 38.9 (L) 11/23/2016   MCV 88.1 11/23/2016   PLT 248 11/23/2016    Recent Labs  03/22/16 1010 06/25/16 0901 07/26/16 0958 11/21/16 1136 11/23/16 1045  NA 138 138 134*  --  135  K 4.1 4.2 4.2  --  4.5  CL 103 100 101  --  96*  CO2 25 32 27  --  32  GLUCOSE 207* 118* 190*  --  132*  BUN '18 20 20  ' --  16  CREATININE 0.89 0.74 0.84 0.80 0.81  CALCIUM 8.7* 9.0 8.7*  --  8.7*  GFRNONAA >60  --  >60  --  >60  GFRAA >60  --  >60  --  >60  PROT 7.7 7.2 7.8  --   --   ALBUMIN 3.8 3.9 3.8  --   --   AST 55* 43* 43*  --   --  ALT 67* 58* 41  --   --    ALKPHOS 95 99 112  --   --   BILITOT 0.6 0.4 0.6  --   --     RADIOGRAPHIC STUDIES:  ASSESSMENT & PLAN:   Malignant neoplasm of right upper lobe of lung (HCC) # Right UL POORLY DIFF CA- T3 N0  Currently status post radiation [finished feb 2017];  Finished adjuvant chemotherapy [end of April 2017] FEB 13th 2018-  CT scan shows continued partial response; stable RUL/ RLL- stable. RML- nodule slightly enlarged  Currently appx 55m [see discussion below]  # right middle lobe lung nodule Discussed that this most likely malignancy at this slowly growing over time. Too small to be biopsied. [Patient reluctant with the biopsy given the history of pneumothorax]. Recommend monitoring for now; recommend SBR T/ if it continues to grow.  Discuss results of recent CT scan.  Lung nodules are stable.  No intervention needed.  Discuss plan for follow-up imaging in 6 months.  # Peripheral neuropathy-stable.  He is unable to wean off Neurontin.   He is followed by Dr. SManuella Ghazi  # Follow up in 4 months for MD (Dr BRogue Bussing assessment and review of chest CT (schedule for 03/23/2017).   # I reviewed the blood work- with the patient in detail; also reviewed the imaging independently [as summarized above]; and with the patient in detail.      MLequita Asal MD 11/23/2016 11:30 AM

## 2016-11-29 ENCOUNTER — Ambulatory Visit (INDEPENDENT_AMBULATORY_CARE_PROVIDER_SITE_OTHER): Payer: Medicare Other | Admitting: Urology

## 2016-11-29 ENCOUNTER — Encounter: Payer: Self-pay | Admitting: Urology

## 2016-11-29 VITALS — BP 160/68 | HR 92 | Ht 69.0 in | Wt 214.0 lb

## 2016-11-29 DIAGNOSIS — C679 Malignant neoplasm of bladder, unspecified: Secondary | ICD-10-CM

## 2016-11-29 LAB — URINALYSIS, COMPLETE
BILIRUBIN UA: NEGATIVE
GLUCOSE, UA: NEGATIVE
KETONES UA: NEGATIVE
Leukocytes, UA: NEGATIVE
NITRITE UA: NEGATIVE
Protein, UA: NEGATIVE
SPEC GRAV UA: 1.025 (ref 1.005–1.030)
UUROB: 0.2 mg/dL (ref 0.2–1.0)
pH, UA: 5.5 (ref 5.0–7.5)

## 2016-11-29 LAB — MICROSCOPIC EXAMINATION: Epithelial Cells (non renal): NONE SEEN /hpf (ref 0–10)

## 2016-11-29 MED ORDER — LIDOCAINE HCL 2 % EX GEL
1.0000 "application " | Freq: Once | CUTANEOUS | Status: AC
  Administered 2016-11-29: 1 via URETHRAL

## 2016-11-29 MED ORDER — CIPROFLOXACIN HCL 500 MG PO TABS
500.0000 mg | ORAL_TABLET | Freq: Once | ORAL | Status: AC
  Administered 2016-11-29: 500 mg via ORAL

## 2016-11-29 NOTE — Progress Notes (Signed)
   11/29/16  CC:  Chief Complaint  Patient presents with  . Cysto    HPI: The patient is a 76 year old gentleman with a past medical history of high-grade 3 cm Ta bladder cancer with no muscle specimen. This was originally diagnosed in October 2016. He underwent reresection in February 2017 which showed no evidence of disease. He has not undergone intravesical immunotherapy because he had been undergoing Taxol-based chemotherapy for a primary lung cancer after his first resection. He ended up not undergoing BCG due to his lung cancer. He has had no evidence of recurrence since primary resection.  Blood pressure (!) 160/68, pulse 92, height 5\' 9"  (1.753 m), weight 214 lb (97.1 kg). NED. A&Ox3.   No respiratory distress   Abd soft, NT, ND Normal phallus with bilateral descended testicles  Cystoscopy Procedure Note  Patient identification was confirmed, informed consent was obtained, and patient was prepped using Betadine solution.  Lidocaine jelly was administered per urethral meatus.    Preoperative abx where received prior to procedure.     Pre-Procedure: - Inspection reveals a normal caliber ureteral meatus.  Procedure: The flexible cystoscope was introduced without difficulty - No urethral strictures/lesions are present. - Normal prostate  - Normal bladder neck - Bilateral ureteral orifices identified - Bladder mucosa  reveals no ulcers, tumors, or lesions - No bladder stones - No trabeculation  Retroflexion shows no intravesical lobe   Post-Procedure: - Patient tolerated the procedure well  Assessment/ Plan:  1. High-grade pTa TCC of the bladder -No evidence of disease. Continue q71month cystoscopy until October 2018. Can increase interval at that time if he continues to be disease free.

## 2016-12-03 ENCOUNTER — Ambulatory Visit: Payer: Medicare Other | Admitting: Radiation Oncology

## 2016-12-10 ENCOUNTER — Encounter: Payer: Self-pay | Admitting: Radiation Oncology

## 2016-12-10 ENCOUNTER — Ambulatory Visit
Admission: RE | Admit: 2016-12-10 | Discharge: 2016-12-10 | Disposition: A | Discharge status: Home / Self Care | Payer: Medicare Other | Source: Ambulatory Visit | Attending: Radiation Oncology | Admitting: Radiation Oncology

## 2016-12-10 VITALS — BP 160/76 | HR 78 | Temp 98.5°F | Resp 22 | Wt 216.2 lb

## 2016-12-10 DIAGNOSIS — Z87891 Personal history of nicotine dependence: Secondary | ICD-10-CM | POA: Diagnosis not present

## 2016-12-10 DIAGNOSIS — Z923 Personal history of irradiation: Secondary | ICD-10-CM | POA: Diagnosis not present

## 2016-12-10 DIAGNOSIS — G629 Polyneuropathy, unspecified: Secondary | ICD-10-CM | POA: Insufficient documentation

## 2016-12-10 DIAGNOSIS — C3411 Malignant neoplasm of upper lobe, right bronchus or lung: Secondary | ICD-10-CM | POA: Diagnosis not present

## 2016-12-10 NOTE — Progress Notes (Signed)
Radiation Oncology Follow up Note  Name: Joseph Graham   Date:   12/10/2016 MRN:  [id number] DOB: 1941/05/26    This 76 y.o. male presents to the clinic today for one half year follow-up status post radiation therapy for right upper lobe poorly differentiated non-small cell lung cancer with 2 separate foci.  REFERRING PROVIDER: [name], MD  HPI: patient is a 76 year old malenow out a year and a half having completed radiation therapy to his right upper lobe for 2 separate areas of poorly differentiated non-small cell lung cancer.He is seen today in routine follow-up is doing well. He specifically denies cough hemoptysis or chest tightness. His most recent CT scan.performed last month shows stable chest right upper lobe nodularity is stable over time.  COMPLICATIONS OF TREATMENT: none  FOLLOW UP COMPLIANCE: keeps appointments   PHYSICAL EXAM:  BP (!) 160/76   Pulse 78   Temp 98.5 F (36.9 C)   Resp (!) 22   Wt 216 lb 2.6 oz (98.1 kg)   BMI 31.92 kg/m  Well-developed well-nourished patient in NAD. HEENT reveals PERLA, EOMI, discs not visualized.  Oral cavity is clear. No oral mucosal lesions are identified. Neck is clear without evidence of cervical or supraclavicular adenopathy. Lungs are clear to A&P. Cardiac examination is essentially unremarkable with regular rate and rhythm without murmur rub or thrill. Abdomen is benign with no organomegaly or masses noted. Motor sensory and DTR levels are equal and symmetric in the upper and lower extremities. Cranial nerves II through XII are grossly intact. Proprioception is intact. No peripheral adenopathy or edema is identified. No motor or sensory levels are noted. Crude visual fields are within normal range.  RADIOLOGY RESULTS: serial CT scans are reviewed  PLAN: present time he is stable I see no evidence to suggest any progressive disease. Patient feels well except for his peripheral neuropathy. I am please was overall progress.  I've asked to see him back in 1 year for follow-up. He continues close follow-up care with medical oncology. He knows to call with any concerns.  I would like to take this opportunity to thank you for allowing me to participate in the care of your patient.Armstead Peaks., MD

## 2016-12-17 ENCOUNTER — Other Ambulatory Visit: Payer: Self-pay | Admitting: *Deleted

## 2016-12-17 DIAGNOSIS — T451X5A Adverse effect of antineoplastic and immunosuppressive drugs, initial encounter: Secondary | ICD-10-CM

## 2016-12-17 DIAGNOSIS — G620 Drug-induced polyneuropathy: Secondary | ICD-10-CM

## 2016-12-17 DIAGNOSIS — C3411 Malignant neoplasm of upper lobe, right bronchus or lung: Secondary | ICD-10-CM

## 2016-12-17 DIAGNOSIS — C3491 Malignant neoplasm of unspecified part of right bronchus or lung: Secondary | ICD-10-CM

## 2016-12-17 MED ORDER — HYDROCODONE-ACETAMINOPHEN 5-325 MG PO TABS
1.0000 | ORAL_TABLET | Freq: Three times a day (TID) | ORAL | 0 refills | Status: DC | PRN
Start: 2016-12-17 — End: 2017-01-29

## 2017-01-29 ENCOUNTER — Other Ambulatory Visit: Payer: Self-pay | Admitting: *Deleted

## 2017-01-29 DIAGNOSIS — C3411 Malignant neoplasm of upper lobe, right bronchus or lung: Secondary | ICD-10-CM

## 2017-01-29 DIAGNOSIS — T451X5A Adverse effect of antineoplastic and immunosuppressive drugs, initial encounter: Secondary | ICD-10-CM

## 2017-01-29 DIAGNOSIS — C3491 Malignant neoplasm of unspecified part of right bronchus or lung: Secondary | ICD-10-CM

## 2017-01-29 DIAGNOSIS — E538 Deficiency of other specified B group vitamins: Secondary | ICD-10-CM | POA: Diagnosis not present

## 2017-01-29 DIAGNOSIS — G620 Drug-induced polyneuropathy: Secondary | ICD-10-CM

## 2017-01-29 DIAGNOSIS — G250 Essential tremor: Secondary | ICD-10-CM | POA: Diagnosis not present

## 2017-01-30 MED ORDER — HYDROCODONE-ACETAMINOPHEN 5-325 MG PO TABS: 1.0000 | ORAL_TABLET | Freq: Three times a day (TID) | ORAL | 0 refills | Status: DC | PRN

## 2017-02-28 ENCOUNTER — Encounter: Payer: Self-pay | Admitting: Urology

## 2017-02-28 ENCOUNTER — Ambulatory Visit (INDEPENDENT_AMBULATORY_CARE_PROVIDER_SITE_OTHER): Payer: Medicare Other | Admitting: Urology

## 2017-02-28 VITALS — BP 137/63 | HR 84 | Ht 69.0 in | Wt 222.4 lb

## 2017-02-28 DIAGNOSIS — C679 Malignant neoplasm of bladder, unspecified: Secondary | ICD-10-CM | POA: Diagnosis not present

## 2017-02-28 LAB — URINALYSIS, COMPLETE
Bilirubin, UA: NEGATIVE
GLUCOSE, UA: NEGATIVE
Ketones, UA: NEGATIVE
LEUKOCYTES UA: NEGATIVE
Nitrite, UA: NEGATIVE
PROTEIN UA: NEGATIVE
Specific Gravity, UA: >1.03 — ABNORMAL HIGH (ref 1.005–1.030)
UUROB: 0.2 mg/dL (ref 0.2–1.0)
pH, UA: 5 (ref 5.0–7.5)

## 2017-02-28 LAB — MICROSCOPIC EXAMINATION
BACTERIA UA: NONE SEEN
WBC, UA: NONE SEEN /hpf (ref 0–?)

## 2017-02-28 MED ORDER — LIDOCAINE HCL 2 % EX GEL
1.0000 "application " | Freq: Once | CUTANEOUS | Status: AC
  Administered 2017-02-28: 1 via URETHRAL

## 2017-02-28 MED ORDER — CIPROFLOXACIN HCL 500 MG PO TABS
500.0000 mg | ORAL_TABLET | Freq: Once | ORAL | Status: AC
  Administered 2017-02-28: 500 mg via ORAL

## 2017-02-28 NOTE — Progress Notes (Signed)
   02/28/17  CC:  Chief Complaint  Patient presents with  . Cysto    HPI: The patient is a 76 year old gentleman with a past medical history of high-grade 3 cm pTa bladder cancer with no muscle specimen. This was originally diagnosed in October 2016. He underwent reresection in February 2017 which showed no evidence of disease. He has not undergone intravesical immunotherapy because he hadbeen undergoing Taxol-based chemotherapy for a primary lung cancer after his first resection. He ended up not undergoing BCG due to his lung cancer. He has had no evidence of recurrence since primary resection.  There were no vitals taken for this visit. NED. A&Ox3.   No respiratory distress   Abd soft, NT, ND Normal phallus with bilateral descended testicles  Cystoscopy Procedure Note  Patient identification was confirmed, informed consent was obtained, and patient was prepped using Betadine solution.  Lidocaine jelly was administered per urethral meatus.    Preoperative abx where received prior to procedure.     Pre-Procedure: - Inspection reveals a normal caliber ureteral meatus.  Procedure: The flexible cystoscope was introduced without difficulty - No urethral strictures/lesions are present. - Enlarged prostate  - Normal bladder neck - Bilateral ureteral orifices identified - Bladder mucosa  reveals no ulcers, tumors, or lesions - No bladder stones - No trabeculation  Retroflexion shows no intravesical lobe   Post-Procedure: - Patient tolerated the procedure well  Assessment/ Plan:  1. High-grade pTa TCC of the bladder -No evidence of disease. Continue increase cystoscopy interval to q6 months. Will need this until September 2020. Can increase interval after that time if he continues to be disease free.

## 2017-03-11 ENCOUNTER — Other Ambulatory Visit: Payer: Self-pay | Admitting: *Deleted

## 2017-03-11 DIAGNOSIS — T451X5A Adverse effect of antineoplastic and immunosuppressive drugs, initial encounter: Secondary | ICD-10-CM

## 2017-03-11 DIAGNOSIS — C3491 Malignant neoplasm of unspecified part of right bronchus or lung: Secondary | ICD-10-CM

## 2017-03-11 DIAGNOSIS — C3411 Malignant neoplasm of upper lobe, right bronchus or lung: Secondary | ICD-10-CM

## 2017-03-11 DIAGNOSIS — G620 Drug-induced polyneuropathy: Secondary | ICD-10-CM

## 2017-03-11 MED ORDER — HYDROCODONE-ACETAMINOPHEN 5-325 MG PO TABS: 1.0000 | ORAL_TABLET | Freq: Three times a day (TID) | ORAL | 0 refills | Status: DC | PRN

## 2017-03-14 ENCOUNTER — Encounter: Payer: Self-pay | Admitting: Internal Medicine

## 2017-03-22 ENCOUNTER — Ambulatory Visit
Admission: RE | Admit: 2017-03-22 | Discharge: 2017-03-22 | Disposition: A | Discharge status: Home / Self Care | Payer: Medicare Other | Source: Ambulatory Visit | Attending: Hematology and Oncology | Admitting: Hematology and Oncology

## 2017-03-22 DIAGNOSIS — R918 Other nonspecific abnormal finding of lung field: Secondary | ICD-10-CM | POA: Diagnosis not present

## 2017-03-22 DIAGNOSIS — C3411 Malignant neoplasm of upper lobe, right bronchus or lung: Secondary | ICD-10-CM | POA: Insufficient documentation

## 2017-03-22 DIAGNOSIS — K8020 Calculus of gallbladder without cholecystitis without obstruction: Secondary | ICD-10-CM | POA: Diagnosis not present

## 2017-03-22 DIAGNOSIS — J432 Centrilobular emphysema: Secondary | ICD-10-CM | POA: Diagnosis not present

## 2017-03-22 DIAGNOSIS — K760 Fatty (change of) liver, not elsewhere classified: Secondary | ICD-10-CM | POA: Insufficient documentation

## 2017-03-22 DIAGNOSIS — I700 Atherosclerosis of aorta: Secondary | ICD-10-CM | POA: Insufficient documentation

## 2017-03-22 DIAGNOSIS — J438 Other emphysema: Secondary | ICD-10-CM | POA: Diagnosis not present

## 2017-03-22 DIAGNOSIS — C3490 Malignant neoplasm of unspecified part of unspecified bronchus or lung: Secondary | ICD-10-CM | POA: Diagnosis not present

## 2017-03-22 LAB — POCT I-STAT CREATININE: Creatinine, Ser: 0.7 mg/dL (ref 0.61–1.24)

## 2017-03-22 IMAGING — CT NM PET TUM IMG INITIAL (PI) SKULL BASE T - THIGH
9 series · 25 of 25 positions shown · non-contrast
Comparison: Screening chest CT 02/18/2015

CLINICAL DATA: Initial treatment strategy for multiple pulmonary
nodules on lung cancer screening CT. Ninety pack-year history of
smoking.

EXAM:
NUCLEAR MEDICINE PET SKULL BASE TO THIGH
TECHNIQUE: 12.68 mCi F-18 FDG was injected intravenously. Full-ring PET imaging
was performed from the skull base to thigh after the radiotracer. CT
data was obtained and used for attenuation correction and anatomic
localization.
FASTING BLOOD GLUCOSE:  Value: 108 mg/dl

[Series 3: ct wb 5.0 b30f · axial · 5.0mm · 0.98mm/px · z∈[-1544,-560]mm · 4 of 329 slices shown]
[im 1/329]
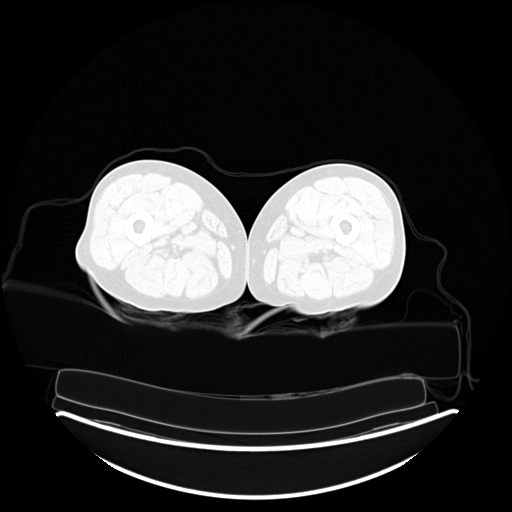
[im 110/329]
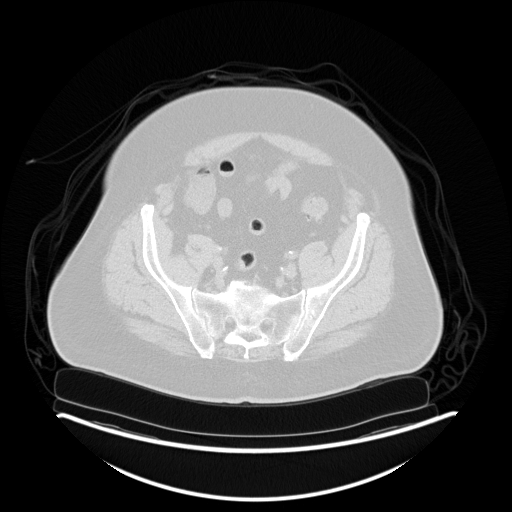
[im 219/329]
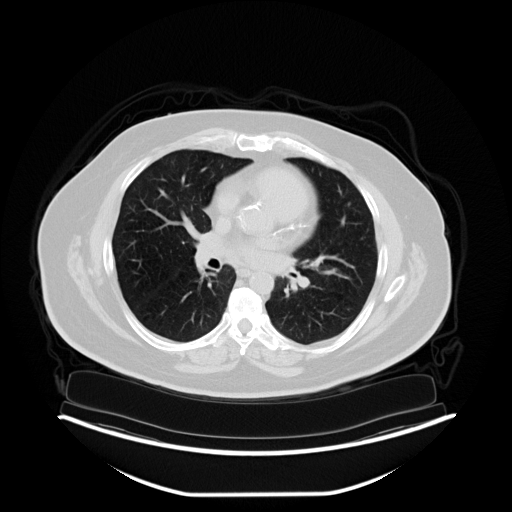
[im 329/329  brain]
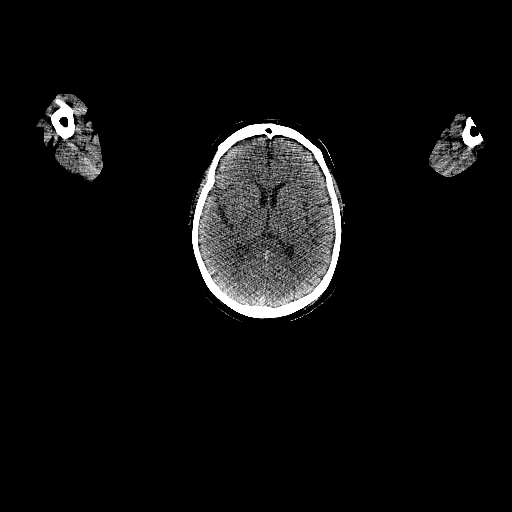

[Series 4: pet wb (ac) · axial · 5.0mm · 4.07mm/px · z∈[-1544,-560]mm · 4 of 329 slices shown]
[im 1/329]
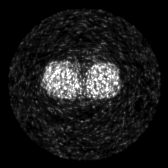
[im 110/329]
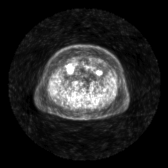
[im 219/329]
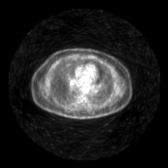
[im 329/329]
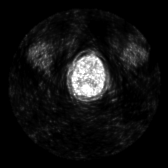

[Series 5: pet wb uncorrected (nac) · axial · 5.0mm · 4.07mm/px · z∈[-1544,-560]mm · 4 of 329 slices shown]
[im 1/329]
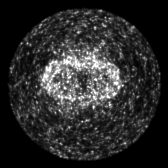
[im 110/329]
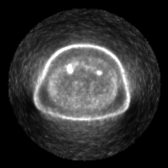
[im 219/329]
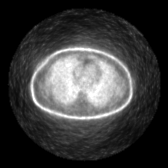
[im 329/329]
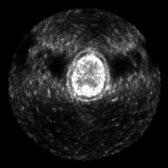

[Series 603: pet axial fused · 4 of 328 slices shown]
[im 1/328]
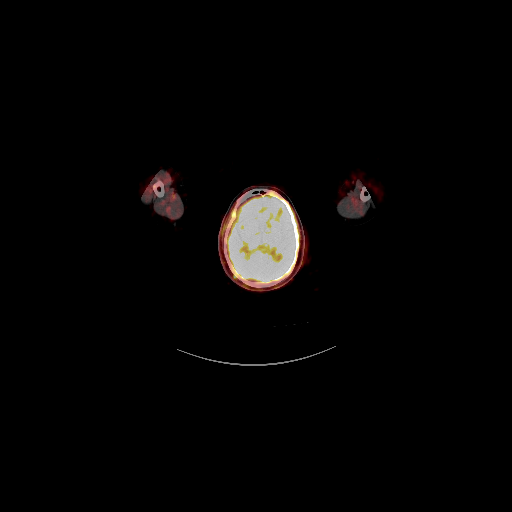
[im 110/328]
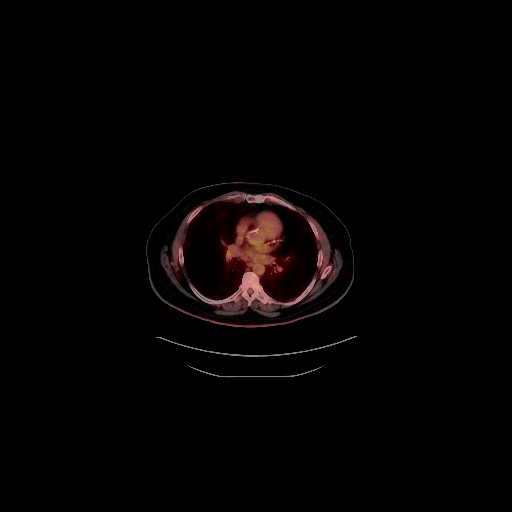
[im 219/328]
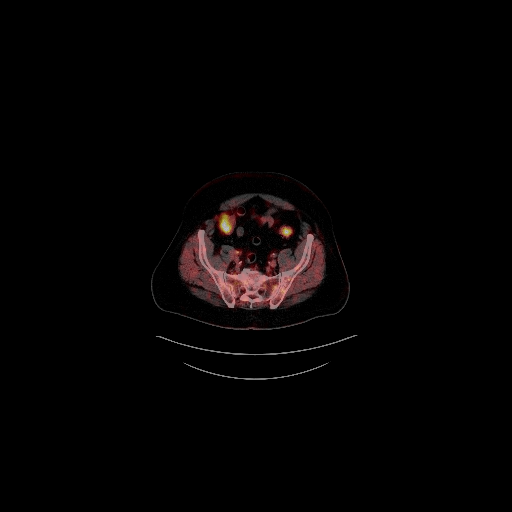
[im 328/328]
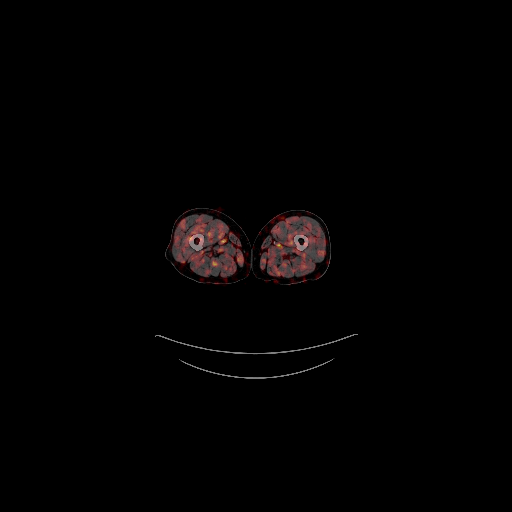

[Series 604: pet coronal fused · 1 of 104 slices shown (1 of 2)]
[im 1/104]
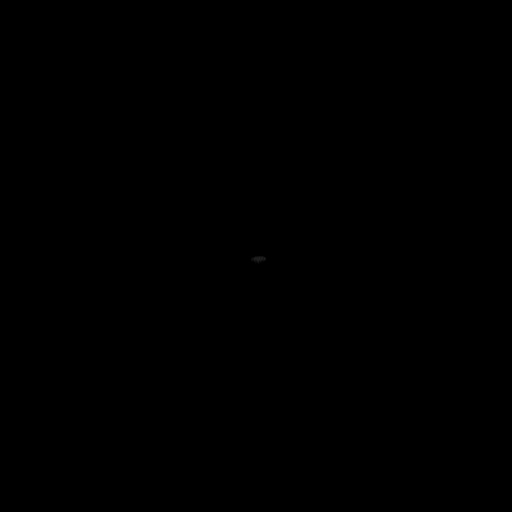

[Series 605: pet coronal fused · 1 of 104 slices shown (2 of 2)]
[im 1/104]
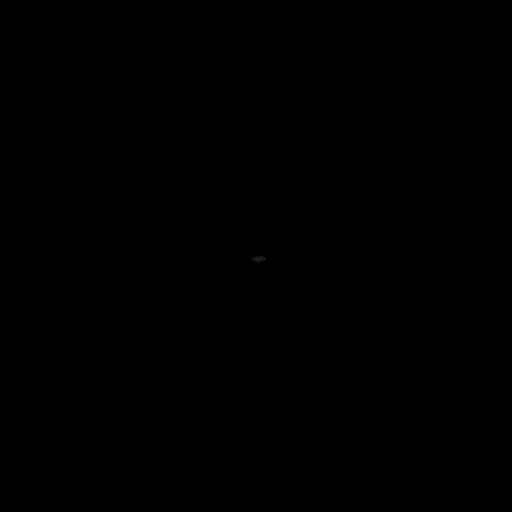

[Series 606: pet axial · 4 of 328 slices shown]
[im 1/328]
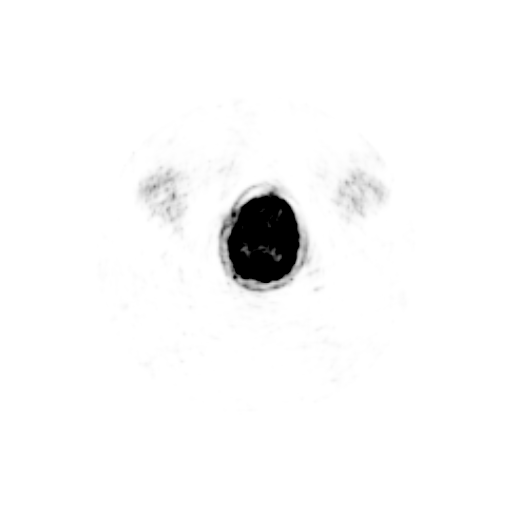
[im 110/328]
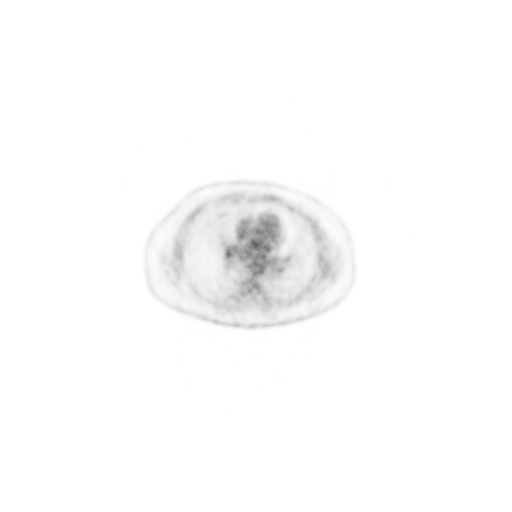
[im 219/328]
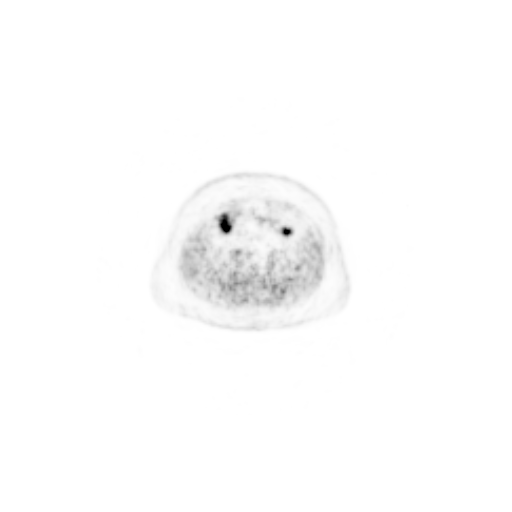
[im 328/328]
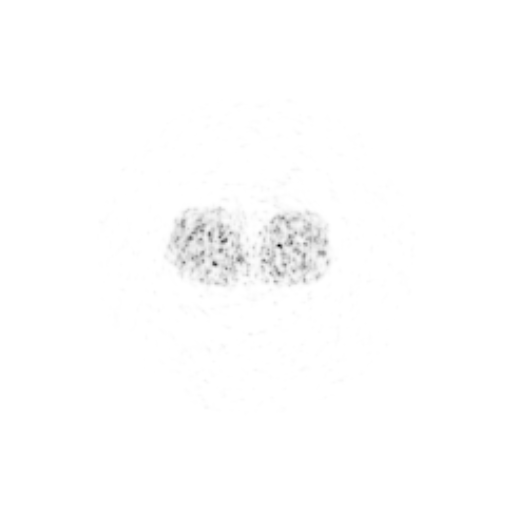

[Series 607: pet coronal · 1 of 123 slices shown]
[im 1/123]
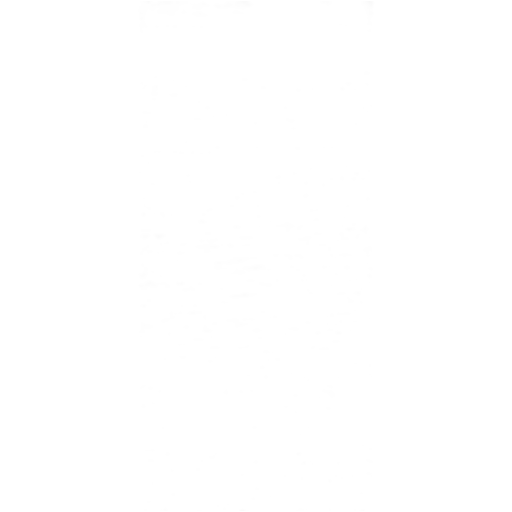

[Series 608: pet sagittal · 2 of 163 slices shown]
[im 1/163]
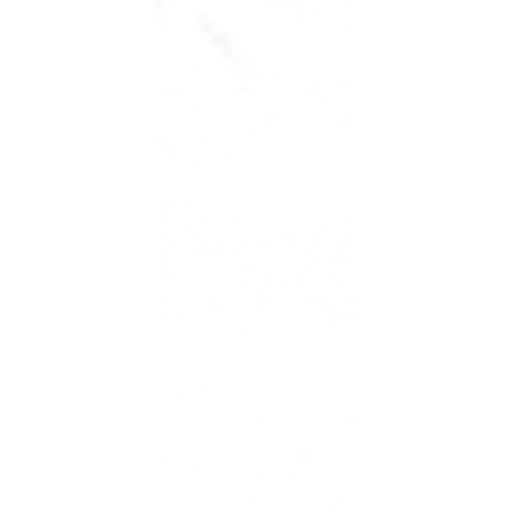
[im 163/163]
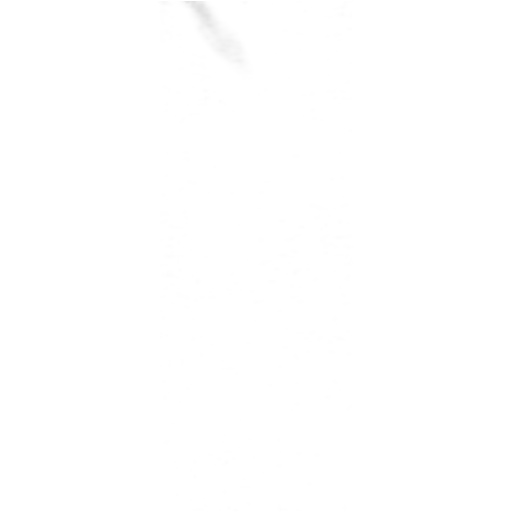

[25 of 25 positions shown; findings below may reference images not displayed]

FINDINGS: NECK

No hypermetabolic cervical lymph nodes are identified.Activity
within Waldeyer's ring is nearly symmetric and within physiologic
limits. No focal lesions of the pharyngeal mucosal space identified.

CHEST

There are no hypermetabolic mediastinal, hilar or axillary lymph
nodes. The 2 dominant spiculated right upper lobe nodules are both
hypermetabolic. The more superior lesion measures 19 x 11 mm on
image 72 and has an SUV max of 6.2. The more inferior lesion
measures 24 x 17 mm on image 80 and has an SUV max of 14.6. Both of
these lesions are highly concerning for bronchogenic carcinoma.
There are additional subcentimeter right lung nodules which are too
small to evaluate with PET-CT and were better seen on recent
screening study. The largest of these are in the right middle lobe
(image 116) and in the right lower lobe (image 124). No suspicious
nodule seen on the left. Emphysema, atherosclerosis and aberrant
right subclavian artery noted.

ABDOMEN/PELVIS

There is no hypermetabolic activity within the liver, adrenal
glands, spleen or pancreas. There is no hypermetabolic nodal
activity. Hepatic steatosis, sigmoid diverticulosis and mild
enlargement of the prostate gland are noted. There is asymmetric
increased soft tissue density within the posterior right bladder
lumen on image 241, without asymmetry of the metabolic activity in
the urine.

SKELETON

There is no hypermetabolic activity to suggest osseous metastatic
disease. A sebaceous cyst is noted in the posterior right back.
IMPRESSION: 1. Both dominant spiculated right upper lobe nodules are
hypermetabolic, most concerning for synchronous primary bronchogenic
carcinoma.
2. No evidence of metastatic disease.
3. There are additional smaller right lung nodules which are too
small to characterize with PET-CT.
4. Potential lesion involving the right side of the bladder
concerning for possible transitional cell carcinoma. There is no
corresponding photopenic defect as would be expected with blood clot
or atypical superior extension of prostate tissue. Correlation with
urine cytology recommended, and cystoscopy should be considered.

## 2017-03-22 MED ORDER — IOPAMIDOL (ISOVUE-300) INJECTION 61%
75.0000 mL | Freq: Once | INTRAVENOUS | Status: AC | PRN
  Administered 2017-03-22: 75 mL via INTRAVENOUS

## 2017-03-25 ENCOUNTER — Inpatient Hospital Stay (HOSPITAL_BASED_OUTPATIENT_CLINIC_OR_DEPARTMENT_OTHER): Payer: Medicare Other | Admitting: Internal Medicine

## 2017-03-25 ENCOUNTER — Inpatient Hospital Stay: Payer: Medicare Other | Attending: Internal Medicine

## 2017-03-25 VITALS — BP 162/66 | HR 79 | Temp 97.8°F | Resp 20 | Ht 69.0 in | Wt 224.6 lb

## 2017-03-25 DIAGNOSIS — G629 Polyneuropathy, unspecified: Secondary | ICD-10-CM

## 2017-03-25 DIAGNOSIS — Z9221 Personal history of antineoplastic chemotherapy: Secondary | ICD-10-CM | POA: Insufficient documentation

## 2017-03-25 DIAGNOSIS — M129 Arthropathy, unspecified: Secondary | ICD-10-CM | POA: Insufficient documentation

## 2017-03-25 DIAGNOSIS — Z79899 Other long term (current) drug therapy: Secondary | ICD-10-CM | POA: Diagnosis not present

## 2017-03-25 DIAGNOSIS — C3411 Malignant neoplasm of upper lobe, right bronchus or lung: Secondary | ICD-10-CM | POA: Diagnosis not present

## 2017-03-25 DIAGNOSIS — Z87891 Personal history of nicotine dependence: Secondary | ICD-10-CM | POA: Diagnosis not present

## 2017-03-25 DIAGNOSIS — F1011 Alcohol abuse, in remission: Secondary | ICD-10-CM | POA: Diagnosis not present

## 2017-03-25 DIAGNOSIS — Z8052 Family history of malignant neoplasm of bladder: Secondary | ICD-10-CM | POA: Insufficient documentation

## 2017-03-25 DIAGNOSIS — G8929 Other chronic pain: Secondary | ICD-10-CM | POA: Diagnosis not present

## 2017-03-25 DIAGNOSIS — M7989 Other specified soft tissue disorders: Secondary | ICD-10-CM

## 2017-03-25 DIAGNOSIS — G709 Myoneural disorder, unspecified: Secondary | ICD-10-CM | POA: Diagnosis not present

## 2017-03-25 DIAGNOSIS — M545 Low back pain: Secondary | ICD-10-CM | POA: Insufficient documentation

## 2017-03-25 DIAGNOSIS — I10 Essential (primary) hypertension: Secondary | ICD-10-CM

## 2017-03-25 DIAGNOSIS — E785 Hyperlipidemia, unspecified: Secondary | ICD-10-CM | POA: Insufficient documentation

## 2017-03-25 DIAGNOSIS — Z86010 Personal history of colonic polyps: Secondary | ICD-10-CM

## 2017-03-25 DIAGNOSIS — J449 Chronic obstructive pulmonary disease, unspecified: Secondary | ICD-10-CM

## 2017-03-25 DIAGNOSIS — R251 Tremor, unspecified: Secondary | ICD-10-CM

## 2017-03-25 DIAGNOSIS — N400 Enlarged prostate without lower urinary tract symptoms: Secondary | ICD-10-CM

## 2017-03-25 DIAGNOSIS — K219 Gastro-esophageal reflux disease without esophagitis: Secondary | ICD-10-CM | POA: Diagnosis not present

## 2017-03-25 DIAGNOSIS — I2510 Atherosclerotic heart disease of native coronary artery without angina pectoris: Secondary | ICD-10-CM | POA: Diagnosis not present

## 2017-03-25 LAB — CBC WITH DIFFERENTIAL/PLATELET
Basophils Absolute: 0 10*3/uL (ref 0–0.1)
Basophils Relative: 1 %
Eosinophils Absolute: 0.2 10*3/uL (ref 0–0.7)
Eosinophils Relative: 3 %
HCT: 41.7 % (ref 40.0–52.0)
Hemoglobin: 13.6 g/dL (ref 13.0–18.0)
Lymphocytes Relative: 21 %
Lymphs Abs: 1.2 10*3/uL (ref 1.0–3.6)
MCH: 29.8 pg (ref 26.0–34.0)
MCHC: 32.7 g/dL (ref 32.0–36.0)
MCV: 91.3 fL (ref 80.0–100.0)
Monocytes Absolute: 0.8 10*3/uL (ref 0.2–1.0)
Monocytes Relative: 13 %
Neutro Abs: 3.6 10*3/uL (ref 1.4–6.5)
Neutrophils Relative %: 62 %
Platelets: 257 10*3/uL (ref 150–440)
RBC: 4.57 MIL/uL (ref 4.40–5.90)
RDW: 15.6 % — ABNORMAL HIGH (ref 11.5–14.5)
WBC: 5.8 10*3/uL (ref 3.8–10.6)

## 2017-03-25 LAB — COMPREHENSIVE METABOLIC PANEL
ALT: 63 U/L (ref 17–63)
AST: 55 U/L — ABNORMAL HIGH (ref 15–41)
Albumin: 4 g/dL (ref 3.5–5.0)
Alkaline Phosphatase: 105 U/L (ref 38–126)
Anion gap: 10 (ref 5–15)
BUN: 16 mg/dL (ref 6–20)
CO2: 30 mmol/L (ref 22–32)
Calcium: 8.9 mg/dL (ref 8.9–10.3)
Chloride: 97 mmol/L — ABNORMAL LOW (ref 101–111)
Creatinine, Ser: 0.8 mg/dL (ref 0.61–1.24)
GFR calc Af Amer: >60 mL/min (ref >60)
GFR calc non Af Amer: >60 mL/min (ref >60)
Glucose, Bld: 111 mg/dL — ABNORMAL HIGH (ref 65–99)
Potassium: 4.6 mmol/L (ref 3.5–5.1)
Sodium: 137 mmol/L (ref 135–145)
Total Bilirubin: 0.5 mg/dL (ref 0.3–1.2)
Total Protein: 8.2 g/dL — ABNORMAL HIGH (ref 6.5–8.1)

## 2017-03-25 NOTE — Assessment & Plan Note (Addendum)
#   Right UL POORLY DIFF CA- T3 N0  Currently status post radiation [finished feb 2017];  Finished adjuvant chemotherapy [end of April 2017] October 2018-  CT scan shows continued partial response; stable RUL/ RLL- stable. RML- nodule slightly enlarged  Currently appx 36mm [see discussion below]  # right middle lobe lung nodule-STABLE; too small to biopsy.  Recommend monitoring for now; recommend SBRT/ if it continues to grow.  # Peripheral neuropathy-not improving/stable. Hydrocodone q 12  however he wants to taper off the hydrocodone as the pain gets better. Neurontin 300 BID; follows up with neurology; Dr.Shah.   # Bil LE swelling/weight gain; not worse.   # Follow up in 6 months/Ct scan prior.   # I reviewed the blood work- with the patient in detail; also reviewed the imaging independently [as summarized above]; and with the patient in detail.

## 2017-03-25 NOTE — Progress Notes (Signed)
[facility] NOTE  Patient Care Team: [name], MD as PCP - General (Family Medicine) Cammie Sickle, MD as Consulting Physician (Internal Medicine) [name], MD as Consulting Physician (Urology) Vladimir Crofts, MD as Consulting Physician (Neurology) Minna Merritts, MD as Consulting Physician (Cardiology) Noreene Filbert, MD as Referring Physician (Radiation Oncology) [name], MD as Referring Physician (Cardiothoracic Surgery)  CHIEF COMPLAINTS/PURPOSE OF CONSULTATION:   Oncology History    # OCT 2016- RUL POORLY DIFF CA [clinically non-small cell] ; STAGE IIB [T3-2 separate nodules in RUL; N0] [s/p CT guided bx]- s/p RT [finished mid jan 2017]; FEB 2017-CT scan- regression of RUL nodules;   # March 2017- Start carbo-taxol q 3 w x4; June 2017- CT- Improvement of RUL nodule/ Stable RUL nodules. OCT 2017- PR   # OCT 2016- 2 sub cm nodules in Right LL- Feb CT 2017- STABLE; June 2017- resolved  # May- 2017- G2 PN  # OCT 2016-NON-invasive bladder urothelial cancer; high grade [Dr. [name]; s/p TURBT [Feb 2017- NEG]; declined BCG  # Pneumothorax [oct 2016 s/p CT Bx];  # MOLECULAR STUDIES- PDL-1 by IHC/keytruda- 11-20%.        Malignant neoplasm of right upper lobe of lung ([place])   12/20/2015 Initial Diagnosis    Malignant neoplasm of right upper lobe of lung ([facility])         HISTORY OF PRESENTING ILLNESS:  Joseph Graham 76 y.o. male with above history of long-standing smoking/COPD-  Non-small cell/ poorly differentiated malignancy of the right upper lobe  T3 N0 status post radiation; followed by adjuvant chemotherapy with [place] Taxol 4 cycles; Is here for follow-up of his bilateral extremity/ Neuropathy; and also to review the results of his restaging CAT scan.  Patient states his neuropathy is improving. He is taking hydrocodone One pill one to 2 times a day. This is significantly worse on exertion. His leg swelling is  improved. Denies any unusual shortness of breath or cough. Denies any headaches. Denies any nausea vomiting.  ROS: A complete 10 point review of system is done which is negative for mentioned above in history of present illness.  MEDICAL HISTORY:  Past Medical History:  Diagnosis Date  . Alcohol abuse, in remission    remote  . Alcohol abuse, in remission    remote   . Arthritis    BACK AND NECK  . Benign essential tremor    improved on metoprolol and gabapentin  . BPH (benign prostatic hypertrophy) 12/30/2012  . Cardiac arrhythmia 03/2010   h/o a flutter and CM, s/p ablation, normal stress test  . Chronic low back pain    MRI 2004, multilevel DDD  . Colon polyps    next colonoscopy due around 2018  . Complication of anesthesia    DID NOT GET COMPLETELY NUMB WITH TONSILLECTOMY  . COPD (chronic obstructive pulmonary disease) ([place])   . Coronary artery disease   . Dyslipidemia    mild off meds  . GERD (gastroesophageal reflux disease)   . HTN (hypertension)   . Multiple pulmonary nodules 02/2015  . Neuromuscular disorder ([place])    neuropathy from chemo....in feet  . Peripheral neuropathy due to chemotherapy ([place]) 12/23/2015   Saw Dr Manuella Ghazi, on gabapentin and cymbalta with hydrocodone PRN (03/2016)  . Personal history of tobacco use, presenting hazards to health 02/18/2015   quit 06/2011, restarted 2014  . Primary cancer of bladder ([place]) 2016   [place]  . Primary lung cancer ([place]) 2016  port a cath removed 03/2016  . Shortness of breath dyspnea    OCC WITH EXERTION    SURGICAL HISTORY: Past Surgical History:  Procedure Laterality Date  . A FLUTTER ABLATION  03/2010  . carotid US  03/2010   WNL  . CATARACT EXTRACTION    . COLONOSCOPY  09/2011   polyps, rpt due 5 yrs, mild diverticulosis [name])  . CYSTOSCOPY W/ RETROGRADES Bilateral 04/06/2015   Procedure: CYSTOSCOPY WITH RETROGRADE PYELOGRAM;  Surgeon: [name], MD;  Location: [facility];  Service: Urology;   Laterality: Bilateral;  . ELECTROMAGNETIC NAVIGATION BROCHOSCOPY N/A 03/02/2015   Procedure: ELECTROMAGNETIC NAVIGATION BRONCHOSCOPY;  Surgeon: [name], MD;  Location: [facility];  Service: Cardiopulmonary;  Laterality: N/A;  . [place]  . PORT-A-CATH REMOVAL Right 04/03/2016   Procedure: REMOVAL PORT-A-CATH;  Surgeon: [name], MD;  Location: [facility];  Service: General;  Laterality: Right;  . PORTACATH PLACEMENT N/A 08/10/2015   Procedure: INSERTION PORT-A-CATH;  Surgeon: [name], MD;  Location: [facility];  Service: General;  Laterality: N/A;  . [place]  . TRANSURETHRAL RESECTION OF BLADDER TUMOR N/A 04/06/2015   Procedure: TRANSURETHRAL RESECTION OF BLADDER TUMOR (TURBT) right ureteral stent placement;  Surgeon: [name], MD;  Location: [facility];  Service: Urology;  Laterality: N/A;  . TRANSURETHRAL RESECTION OF BLADDER TUMOR N/A 07/13/2015   Procedure: TRANSURETHRAL RESECTION OF BLADDER TUMOR (TURBT);  Surgeon: [name], MD;  Location: [facility];  Service: Urology;  Laterality: N/A;  . urinary stent removal  04/14/15    SOCIAL HISTORY: Patient is retired from Press [employer]. He lives in [place]. Social History   Social History  . Marital status: Married    Spouse name: N/A  . Number of children: N/A  . Years of education: N/A   Occupational History  . Not on file.   Social History Main Topics  . Smoking status: Former Smoker    Packs/day: 1.50    Years: 61.00    Types: Cigarettes    Quit date: 03/08/2015  . Smokeless tobacco: Never Used     Comment: cut back 0.5 cigarettes--stopped 03/07/15; now now chantix  . Alcohol use No     Comment: has not drank in 37 years  . Drug use: No  . Sexual activity: No   Other Topics Concern  . Not on file   Social History Narrative   Caffeine: 1 cup decaf/day   Lives with wife   Occupation: retired, worked Press photographer   Activity: gym 3x/wk, Immunologist, hobbies (paint, building things)   Diet:  healthy, good water, fruits/vegetables daily, red meat 1x/wk    FAMILY HISTORY: Family History  Problem Relation Age of Onset  . Cervical cancer Mother 49  . Hypertension Father   . Tremor Father        and several uncles/aunts (not parkinson's)  . Stroke Brother 54  . Bladder Cancer Brother 54  . Prostate cancer Neg Hx   . Kidney cancer Neg Hx     ALLERGIES:  has No Known Allergies.  MEDICATIONS:  Current Outpatient Prescriptions  Medication Sig Dispense Refill  . Cyanocobalamin (B-12 PO) Take 1 tablet by mouth daily.    Marland Kitchen gabapentin (NEURONTIN) 300 MG capsule Take 1 capsule (300 mg total) by mouth 3 (three) times daily. (Patient taking differently: Take 300 mg by mouth 3 (three) times daily. 1 tablet in am,  1 tablet at lunch, and 2 tablets at bedtime (600 mg)) 90 capsule 3  .  HYDROcodone-acetaminophen (NORCO) 5-325 MG tablet Take 1 tablet by mouth every 8 (eight) hours as needed for moderate pain. 90 tablet 0  . lisinopril (PRINIVIL,ZESTRIL) 10 MG tablet Take 1 tablet (10 mg total) by mouth every evening. 90 tablet 3  . Magnesium Cl-Calcium Carbonate (SLOW-MAG PO) Take 1 tablet by mouth daily.    . metoprolol tartrate (LOPRESSOR) 25 MG tablet Take 0.5 tablets (12.5 mg total) by mouth 2 (two) times daily. 90 tablet 3  . Multiple Vitamin (MULTI-VITAMINS) TABS Take by mouth.    . primidone (MYSOLINE) 50 MG tablet Take 50 mg by mouth at bedtime.   1  . VITAMIN E PO Take 1 tablet by mouth daily.    . polyethylene glycol (MIRALAX / GLYCOLAX) packet Take 17 g by mouth daily as needed for moderate constipation.     No current facility-administered medications for this visit.   Marland Kitchen  PHYSICAL EXAMINATION: ECOG PERFORMANCE STATUS: 0 - Asymptomatic  Vitals:   03/25/17 1500  BP: (!) 162/66  Pulse: 79  Resp: 20  Temp: 97.8 F (36.6 C)   Filed Weights   03/25/17 1500  Weight: 224 lb 9.6 oz (101.9 kg)  Pulse ox 94% on room air.  GENERAL:alert, no distress and comfortable. He is  Accompanied by his wife. SKIN: skin color, texture, turgor are normal, no rashes or significant lesions EYES: normal, conjunctiva are pink and non-injected, sclera clear OROPHARYNX:no exudate, no erythema and lips, buccal mucosa, and tongue normal  NECK: supple, thyroid normal size, non-tender, without nodularity LYMPH:  no palpable lymphadenopathy in the cervical, axillary or inguinal LUNGS: clear to auscultation and percussion with normal breathing effort HEART: regular rate & rhythm and no murmurs and no edema. ABDOMEN:abdomen soft, non-tender and normal bowel sounds Musculoskeletal:no cyanosis of digits and no clubbing  PSYCH: alert & oriented x 3 with fluent speech NEURO: no focal motor/sensory deficits  LABORATORY DATA:  I have reviewed the data as listed Lab Results  Component Value Date   WBC 5.8 03/25/2017   HGB 13.6 03/25/2017   HCT 41.7 03/25/2017   MCV 91.3 03/25/2017   PLT 257 03/25/2017    Recent Labs  06/25/16 0901 07/26/16 0958  11/23/16 1045 03/22/17 0945 03/25/17 1445  NA 138 134*  --  135  --  137  K 4.2 4.2  --  4.5  --  4.6  CL 100 101  --  96*  --  97*  CO2 32 27  --  32  --  30  GLUCOSE 118* 190*  --  132*  --  111*  BUN 20 20  --  16  --  16  CREATININE 0.74 0.84  < > 0.81 0.70 0.80  CALCIUM 9.0 8.7*  --  8.7*  --  8.9  GFRNONAA  --  >60  --  >60  --  >60  GFRAA  --  >60  --  >60  --  >60  PROT 7.2 7.8  --   --   --  8.2*  ALBUMIN 3.9 3.8  --   --   --  4.0  AST 43* 43*  --   --   --  55*  ALT 58* 41  --   --   --  63  ALKPHOS 99 112  --   --   --  105  BILITOT 0.4 0.6  --   --   --  0.5  < > = values in this interval not displayed.  RADIOGRAPHIC  STUDIES:  ASSESSMENT & PLAN:   Malignant neoplasm of right upper lobe of lung ([place]) # Right UL POORLY DIFF CA- T3 N0  Currently status post radiation [finished feb 2017];  Finished adjuvant chemotherapy [end of April 2017] October 2018-  CT scan shows continued partial response; stable RUL/ RLL-  stable. RML- nodule slightly enlarged  Currently appx 18m [see discussion below]  # right middle lobe lung nodule-STABLE; too small to biopsy.  Recommend monitoring for now; recommend SBRT/ if it continues to grow.  # Peripheral neuropathy-not improving/stable. Hydrocodone q 12  however he wants to taper off the hydrocodone as the pain gets better. Neurontin 300 BID; follows up with neurology; Dr.Shah.   # Bil LE swelling/weight gain; not worse.   # Follow up in 6 months/Ct scan prior.   # I reviewed the blood work- with the patient in detail; also reviewed the imaging independently [as summarized above]; and with the patient in detail.      GCammie Sickle MD 04/02/2017 4:58 PM

## 2017-04-23 ENCOUNTER — Other Ambulatory Visit: Payer: Self-pay | Admitting: *Deleted

## 2017-04-23 DIAGNOSIS — T451X5A Adverse effect of antineoplastic and immunosuppressive drugs, initial encounter: Secondary | ICD-10-CM

## 2017-04-23 DIAGNOSIS — C3491 Malignant neoplasm of unspecified part of right bronchus or lung: Secondary | ICD-10-CM

## 2017-04-23 DIAGNOSIS — G620 Drug-induced polyneuropathy: Secondary | ICD-10-CM

## 2017-04-23 DIAGNOSIS — C3411 Malignant neoplasm of upper lobe, right bronchus or lung: Secondary | ICD-10-CM

## 2017-04-23 MED ORDER — HYDROCODONE-ACETAMINOPHEN 5-325 MG PO TABS: 1.0000 | ORAL_TABLET | Freq: Three times a day (TID) | ORAL | 0 refills | Status: DC | PRN

## 2017-05-01 ENCOUNTER — Other Ambulatory Visit: Payer: Self-pay

## 2017-05-01 ENCOUNTER — Ambulatory Visit (AMBULATORY_SURGERY_CENTER): Payer: Self-pay | Admitting: *Deleted

## 2017-05-01 VITALS — Ht 69.0 in | Wt 224.0 lb

## 2017-05-01 DIAGNOSIS — Z86010 Personal history of colonic polyps: Secondary | ICD-10-CM

## 2017-05-01 NOTE — Progress Notes (Signed)
No egg or soy allergy known to patient  No issues with past sedation with any surgeries  or procedures, no intubation problems -with tonsillectomy did not get numb all the way  No diet pills per patient No home 02 use per patient  No blood thinners per patient  Pt denies issues with constipation  No A fib or A flutter  EMMI video sent to pt's e mail -pt declined

## 2017-05-07 ENCOUNTER — Encounter: Payer: Self-pay | Admitting: Internal Medicine

## 2017-05-16 ENCOUNTER — Ambulatory Visit: Payer: Medicare Other | Admitting: Internal Medicine

## 2017-05-16 NOTE — Progress Notes (Signed)
Patient states that he "lives with an O2 at 84% since he has had lung cancer and has COPD.  CRNA notified, and she will speak with the  Head CRNA for an answer about admitting to  the unit. Patient states that he had been on o2 at night for 1 year. He was taken off the o2, and he is supposed to use two inhalers.  Patient states that he can't afford them. Patient is short of breath with exertion.  Lafe Garin, CRNA will give patient a breathing treatment, and decide them to do the patient here or not.  Treatment given and patient's sats are still low.  Dr. [name] spoke with the patient, and they decided not to do the procedure here. Kennith Gain, CRNA is aware

## 2017-05-16 NOTE — Patient Instructions (Signed)
   Sorry we had to cancel your procedure today but as I explained I think its in your best interest.  I do recommend you get back with Dr. Danise Mina and see if there are ways to get cheaper medications and to be considered for oxygen therapy again.  Gatha Mayer, MD, [facility]

## 2017-05-16 NOTE — Progress Notes (Signed)
Patient unable to maintain RA sat above 90% despite nebulizer.  He has stopped inhaler Tx and O2 Tx due to costs.  I explained do not think in his best interest to do procedure - risks> benefit and given overall situation would not repeat colonoscopy routinely.

## 2017-05-21 ENCOUNTER — Telehealth: Payer: Self-pay | Admitting: Family Medicine

## 2017-05-21 NOTE — Telephone Encounter (Signed)
Noted colonoscopy cancelled due to low oxygen saturations. Would call pt and offer appt for recheck with spirometry (30 min) and if declined would offer to schedule his next CPE after 07/02/2017.

## 2017-05-28 NOTE — Telephone Encounter (Signed)
I spoke with pt. He declined an OV for spirometry an we set up AWV/cpe 07/03/17- he refused an appt with Leisa. He said he is breathing ok, it comes and goes and he would call back if his symptoms get bad enough.

## 2017-05-30 ENCOUNTER — Telehealth: Payer: Self-pay | Admitting: *Deleted

## 2017-05-30 DIAGNOSIS — C3491 Malignant neoplasm of unspecified part of right bronchus or lung: Secondary | ICD-10-CM

## 2017-05-30 DIAGNOSIS — C3411 Malignant neoplasm of upper lobe, right bronchus or lung: Secondary | ICD-10-CM

## 2017-05-30 DIAGNOSIS — T451X5A Adverse effect of antineoplastic and immunosuppressive drugs, initial encounter: Secondary | ICD-10-CM

## 2017-05-30 DIAGNOSIS — G620 Drug-induced polyneuropathy: Secondary | ICD-10-CM

## 2017-05-30 MED ORDER — HYDROCODONE-ACETAMINOPHEN 5-325 MG PO TABS: 1.0000 | ORAL_TABLET | Freq: Three times a day (TID) | ORAL | 0 refills | Status: DC | PRN

## 2017-05-30 NOTE — Telephone Encounter (Signed)
Patient called requesting refil for NORCO 5-325 mg. Medication refilled.

## 2017-06-30 ENCOUNTER — Other Ambulatory Visit: Payer: Self-pay | Admitting: Family Medicine

## 2017-06-30 DIAGNOSIS — G620 Drug-induced polyneuropathy: Secondary | ICD-10-CM

## 2017-06-30 DIAGNOSIS — E119 Type 2 diabetes mellitus without complications: Secondary | ICD-10-CM

## 2017-06-30 DIAGNOSIS — T451X5A Adverse effect of antineoplastic and immunosuppressive drugs, initial encounter: Principal | ICD-10-CM

## 2017-06-30 DIAGNOSIS — D701 Agranulocytosis secondary to cancer chemotherapy: Secondary | ICD-10-CM

## 2017-06-30 DIAGNOSIS — C3411 Malignant neoplasm of upper lobe, right bronchus or lung: Secondary | ICD-10-CM

## 2017-06-30 DIAGNOSIS — N400 Enlarged prostate without lower urinary tract symptoms: Secondary | ICD-10-CM

## 2017-07-01 ENCOUNTER — Other Ambulatory Visit (INDEPENDENT_AMBULATORY_CARE_PROVIDER_SITE_OTHER): Payer: Medicare Other

## 2017-07-01 DIAGNOSIS — D701 Agranulocytosis secondary to cancer chemotherapy: Secondary | ICD-10-CM | POA: Diagnosis not present

## 2017-07-01 DIAGNOSIS — T451X5A Adverse effect of antineoplastic and immunosuppressive drugs, initial encounter: Secondary | ICD-10-CM | POA: Diagnosis not present

## 2017-07-01 DIAGNOSIS — C3411 Malignant neoplasm of upper lobe, right bronchus or lung: Secondary | ICD-10-CM | POA: Diagnosis not present

## 2017-07-01 DIAGNOSIS — G620 Drug-induced polyneuropathy: Secondary | ICD-10-CM

## 2017-07-01 DIAGNOSIS — E119 Type 2 diabetes mellitus without complications: Secondary | ICD-10-CM | POA: Diagnosis not present

## 2017-07-01 DIAGNOSIS — N400 Enlarged prostate without lower urinary tract symptoms: Secondary | ICD-10-CM

## 2017-07-01 LAB — CBC WITH DIFFERENTIAL/PLATELET
BASOS PCT: 1.2 % (ref 0.0–3.0)
Basophils Absolute: 0.1 10*3/uL (ref 0.0–0.1)
EOS PCT: 4.1 % (ref 0.0–5.0)
Eosinophils Absolute: 0.3 10*3/uL (ref 0.0–0.7)
HCT: 43.2 % (ref 39.0–52.0)
Hemoglobin: 13.6 g/dL (ref 13.0–17.0)
LYMPHS ABS: 1.7 10*3/uL (ref 0.7–4.0)
Lymphocytes Relative: 21 % (ref 12.0–46.0)
MCHC: 31.4 g/dL (ref 30.0–36.0)
MCV: 87.5 fl (ref 78.0–100.0)
MONO ABS: 0.6 10*3/uL (ref 0.1–1.0)
Monocytes Relative: 8 % (ref 3.0–12.0)
NEUTROS ABS: 5.2 10*3/uL (ref 1.4–7.7)
NEUTROS PCT: 65.7 % (ref 43.0–77.0)
PLATELETS: 316 10*3/uL (ref 150.0–400.0)
RBC: 4.94 Mil/uL (ref 4.22–5.81)
RDW: 17.3 % — AB (ref 11.5–15.5)
WBC: 7.9 10*3/uL (ref 4.0–10.5)

## 2017-07-01 LAB — COMPREHENSIVE METABOLIC PANEL
ALT: 43 U/L (ref 0–53)
AST: 33 U/L (ref 0–37)
Albumin: 4 g/dL (ref 3.5–5.2)
Alkaline Phosphatase: 110 U/L (ref 39–117)
BILIRUBIN TOTAL: 0.5 mg/dL (ref 0.2–1.2)
BUN: 19 mg/dL (ref 6–23)
CHLORIDE: 95 meq/L — AB (ref 96–112)
CO2: 39 meq/L — AB (ref 19–32)
CREATININE: 0.67 mg/dL (ref 0.40–1.50)
Calcium: 9 mg/dL (ref 8.4–10.5)
GFR: 122.26 mL/min (ref >60.00)
GLUCOSE: 145 mg/dL — AB (ref 70–99)
Potassium: 4.6 mEq/L (ref 3.5–5.1)
SODIUM: 138 meq/L (ref 135–145)
Total Protein: 7.4 g/dL (ref 6.0–8.3)

## 2017-07-01 LAB — LIPID PANEL
CHOL/HDL RATIO: 4
Cholesterol: 193 mg/dL (ref 0–200)
HDL: 46.1 mg/dL (ref >39.00)
LDL Cholesterol: 121 mg/dL — ABNORMAL HIGH (ref 0–99)
NONHDL: 147.34
Triglycerides: 130 mg/dL (ref 0.0–149.0)
VLDL: 26 mg/dL (ref 0.0–40.0)

## 2017-07-01 LAB — PSA: PSA: 0.64 ng/mL (ref 0.10–4.00)

## 2017-07-01 LAB — HEMOGLOBIN A1C: Hgb A1c MFr Bld: 7.4 % — ABNORMAL HIGH (ref 4.6–6.5)

## 2017-07-03 ENCOUNTER — Telehealth: Payer: Self-pay | Admitting: Family Medicine

## 2017-07-03 ENCOUNTER — Ambulatory Visit (INDEPENDENT_AMBULATORY_CARE_PROVIDER_SITE_OTHER): Payer: Medicare Other | Admitting: Family Medicine

## 2017-07-03 ENCOUNTER — Encounter: Payer: Self-pay | Admitting: Family Medicine

## 2017-07-03 ENCOUNTER — Other Ambulatory Visit: Payer: Self-pay | Admitting: Family Medicine

## 2017-07-03 VITALS — BP 138/58 | HR 77 | Temp 98.0°F | Ht 65.5 in | Wt 218.5 lb

## 2017-07-03 DIAGNOSIS — C3411 Malignant neoplasm of upper lobe, right bronchus or lung: Secondary | ICD-10-CM | POA: Diagnosis not present

## 2017-07-03 DIAGNOSIS — Z23 Encounter for immunization: Secondary | ICD-10-CM

## 2017-07-03 DIAGNOSIS — T451X5A Adverse effect of antineoplastic and immunosuppressive drugs, initial encounter: Secondary | ICD-10-CM

## 2017-07-03 DIAGNOSIS — Z0000 Encounter for general adult medical examination without abnormal findings: Secondary | ICD-10-CM | POA: Diagnosis not present

## 2017-07-03 DIAGNOSIS — R0902 Hypoxemia: Secondary | ICD-10-CM

## 2017-07-03 DIAGNOSIS — Z860100 Personal history of colon polyps, unspecified: Secondary | ICD-10-CM

## 2017-07-03 DIAGNOSIS — C679 Malignant neoplasm of bladder, unspecified: Secondary | ICD-10-CM | POA: Diagnosis not present

## 2017-07-03 DIAGNOSIS — Z7189 Other specified counseling: Secondary | ICD-10-CM

## 2017-07-03 DIAGNOSIS — I10 Essential (primary) hypertension: Secondary | ICD-10-CM

## 2017-07-03 DIAGNOSIS — G620 Drug-induced polyneuropathy: Secondary | ICD-10-CM | POA: Diagnosis not present

## 2017-07-03 DIAGNOSIS — J449 Chronic obstructive pulmonary disease, unspecified: Secondary | ICD-10-CM

## 2017-07-03 DIAGNOSIS — I739 Peripheral vascular disease, unspecified: Secondary | ICD-10-CM | POA: Diagnosis not present

## 2017-07-03 MED ORDER — LISINOPRIL 10 MG PO TABS: 10.0000 mg | ORAL_TABLET | Freq: Every evening | ORAL | 3 refills | Status: DC

## 2017-07-03 MED ORDER — METOPROLOL TARTRATE 25 MG PO TABS: 12.5000 mg | ORAL_TABLET | Freq: Two times a day (BID) | ORAL | 3 refills | Status: DC

## 2017-07-03 MED ORDER — TIOTROPIUM BROMIDE MONOHYDRATE 18 MCG IN CAPS: 18.0000 ug | ORAL_CAPSULE | Freq: Every day | RESPIRATORY_TRACT | 12 refills | Status: DC

## 2017-07-03 NOTE — Telephone Encounter (Signed)
Spoke with [facility] [address] to have metoprolol and Spiriva transferred from [place].  Pharmacist says he will take care of it.  Spoke with pt making him aware.  Says ok.

## 2017-07-03 NOTE — Assessment & Plan Note (Addendum)
Gold stabe III by last PFTs 2016. Last saw Dr Mortimer Fries 07/2015. Not on regular respiratory medication at this time. Endorses ongoing hypoxia for the last several years. We have not evaluated pt recently for this. Reviewed recent colonoscopy notes. Declines pulm f/u at this time but agrees to set up home oxygen trial and will price out spiriva (states incruse elipta was most effective to help dyspnea).  Pulse ox at rest 84%, improves to 92% on 2L [place]

## 2017-07-03 NOTE — Assessment & Plan Note (Signed)
Will need to consider starting aspirin, statin.

## 2017-07-03 NOTE — Assessment & Plan Note (Signed)
Colonoscopy cancelled due to hypoxia. Will defer at this time.

## 2017-07-03 NOTE — Patient Instructions (Addendum)
Flu shot today.  Price out spiriva in place of ellipta.  Trial O2 for home use.  Return in 3 months for diabetes follow up visit  Diabetes Mellitus and Nutrition When you have diabetes (diabetes mellitus), it is very important to have healthy eating habits because your blood sugar (glucose) levels are greatly affected by what you eat and drink. Eating healthy foods in the appropriate amounts, at about the same times every day, can help you:  Control your blood glucose.  Lower your risk of heart disease.  Improve your blood pressure.  Reach or maintain a healthy weight.  Every person with diabetes is different, and each person has different needs for a meal plan. Your health care provider may recommend that you work with a diet and nutrition specialist (dietitian) to make a meal plan that is best for you. Your meal plan may vary depending on factors such as:  The calories you need.  The medicines you take.  Your weight.  Your blood glucose, blood pressure, and cholesterol levels.  Your activity level.  Other health conditions you have, such as heart or kidney disease.  How do carbohydrates affect me? Carbohydrates affect your blood glucose level more than any other type of food. Eating carbohydrates naturally increases the amount of glucose in your blood. Carbohydrate counting is a method for keeping track of how many carbohydrates you eat. Counting carbohydrates is important to keep your blood glucose at a healthy level, especially if you use insulin or take certain oral diabetes medicines. It is important to know how many carbohydrates you can safely have in each meal. This is different for every person. Your dietitian can help you calculate how many carbohydrates you should have at each meal and for snack. Foods that contain carbohydrates include:  Bread, cereal, rice, pasta, and crackers.  Potatoes and corn.  Peas, beans, and lentils.  Milk and yogurt.  Fruit and  juice.  Desserts, such as cakes, cookies, ice cream, and candy.  How does alcohol affect me? Alcohol can cause a sudden decrease in blood glucose (hypoglycemia), especially if you use insulin or take certain oral diabetes medicines. Hypoglycemia can be a life-threatening condition. Symptoms of hypoglycemia (sleepiness, dizziness, and confusion) are similar to symptoms of having too much alcohol. If your health care provider says that alcohol is safe for you, follow these guidelines:  Limit alcohol intake to no more than 1 drink per day for nonpregnant women and 2 drinks per day for men. One drink equals 12 oz of beer, 5 oz of wine, or 1 oz of hard liquor.  Do not drink on an empty stomach.  Keep yourself hydrated with water, diet soda, or unsweetened iced tea.  Keep in mind that regular soda, juice, and other mixers may contain a lot of sugar and must be counted as carbohydrates.  What are tips for following this plan? Reading food labels  Start by checking the serving size on the label. The amount of calories, carbohydrates, fats, and other nutrients listed on the label are based on one serving of the food. Many foods contain more than one serving per package.  Check the total grams (g) of carbohydrates in one serving. You can calculate the number of servings of carbohydrates in one serving by dividing the total carbohydrates by 15. For example, if a food has 30 g of total carbohydrates, it would be equal to 2 servings of carbohydrates.  Check the number of grams (g) of saturated and trans fats  in one serving. Choose foods that have low or no amount of these fats.  Check the number of milligrams (mg) of sodium in one serving. Most people should limit total sodium intake to less than 2,300 mg per day.  Always check the nutrition information of foods labeled as "low-fat" or "nonfat". These foods may be higher in added sugar or refined carbohydrates and should be avoided.  Talk to your  dietitian to identify your daily goals for nutrients listed on the label. Shopping  Avoid buying canned, premade, or processed foods. These foods tend to be high in fat, sodium, and added sugar.  Shop around the outside edge of the grocery store. This includes fresh fruits and vegetables, bulk grains, fresh meats, and fresh dairy. Cooking  Use low-heat cooking methods, such as baking, instead of high-heat cooking methods like deep frying.  Cook using healthy oils, such as olive, canola, or sunflower oil.  Avoid cooking with butter, cream, or high-fat meats. Meal planning  Eat meals and snacks regularly, preferably at the same times every day. Avoid going long periods of time without eating.  Eat foods high in fiber, such as fresh fruits, vegetables, beans, and whole grains. Talk to your dietitian about how many servings of carbohydrates you can eat at each meal.  Eat 4-6 ounces of lean protein each day, such as lean meat, chicken, fish, eggs, or tofu. 1 ounce is equal to 1 ounce of meat, chicken, or fish, 1 egg, or 1/4 cup of tofu.  Eat some foods each day that contain healthy fats, such as avocado, nuts, seeds, and fish. Lifestyle   Check your blood glucose regularly.  Exercise at least 30 minutes 5 or more days each week, or as told by your health care provider.  Take medicines as told by your health care provider.  Do not use any products that contain nicotine or tobacco, such as cigarettes and e-cigarettes. If you need help quitting, ask your health care provider.  Work with a Social worker or diabetes educator to identify strategies to manage stress and any emotional and social challenges. What are some questions to ask my health care provider?  Do I need to meet with a diabetes educator?  Do I need to meet with a dietitian?  What number can I call if I have questions?  When are the best times to check my blood glucose? Where to find more information:  American Diabetes  Association: diabetes.org/food-and-fitness/food  Academy of Nutrition and Dietetics: PokerClues.dk  [employer] of Diabetes and Digestive and Kidney Diseases (NIH): ContactWire.be Summary  A healthy meal plan will help you control your blood glucose and maintain a healthy lifestyle.  Working with a diet and nutrition specialist (dietitian) can help you make a meal plan that is best for you.  Keep in mind that carbohydrates and alcohol have immediate effects on your blood glucose levels. It is important to count carbohydrates and to use alcohol carefully. This information is not intended to replace advice given to you by your health care provider. Make sure you discuss any questions you have with your health care provider. Document Released: 02/22/2005 Document Revised: 07/02/2016 Document Reviewed: 07/02/2016 Elsevier Interactive Patient Education  [employer].

## 2017-07-03 NOTE — Progress Notes (Signed)
BP (!) 138/58 (BP Location: Right Arm, Cuff Size: Normal)   Pulse 77   Temp 98 F (36.7 C) (Oral)   Ht 5' 5.5" (1.664 m)   Wt 218 lb 8 oz (99.1 kg)   SpO2 92%   BMI 35.81 kg/m   O2 sat rises to 92% on 2L O2 by [name]  CC: medicare wellness visit Subjective:    Patient ID: Joseph Graham, male    DOB: 03-06-1941, 77 y.o.   MRN: [id number]  HPI: Joseph Graham is a 77 y.o. male presenting on 07/03/2017 for Medicare Wellness   Did not see Katha Cabal this year.   Found primary lung cancer by CT on 2016. During work up found primary bladder cancer. Completed radiation therapy and chemotherapy with resultant peripheral neuropathy, treated with cymbalta 60mg  daily and acupuncture - slowly improving. Monitoring with PET Q55mo. Bladder cancer initially removed, now followed by Q17mo cystoscopy [name]).   Found to be hypoxic during recent attempt at colonoscopy 05/2017 - to 84% "my norm" per patient since lung cancer. Cancelled due to hypoxia. Known COPD. Not using nocturnal O2 - he did use this up to a year ago due to dry nasal passage.   Last saw pulm ([facility]) 07/2015 - prescribed breo, incruse ellipta, albuterol PRN. Expensive and didn't feel they helped. Did feel ellipta helped and still uses some intermittently. Did not f/u.  PFTs - FVC 48%, FEV1 39%, ratio 0.6 (02/2015)  DM - difficulty exercising due to neuropathy and dyspnea. Has focused on diet - smaller portions, limiting carbs, increasing vegetables. Was upset at last year's discussion of diet-controlled diabetes diagnosis. Reviewed this with patient.   Preventative: Colonoscopy - 09/2011 polyps, rpt due 5 yrs, mild diverticulosis [name]) - see above cancelled due to hypoxia.  Lung cancer screening - see above s/p lung cancer Prostate - mild BPH. Decided to age out.  Flu - declines.  Td 07/2011.  Pneumonia - 2012. prevnar - 12/2013.  zostavax - declines. Never had chicken pox.  shingrix - discussed.  Advanced directives: previously  reviewed, forms scanned 04/07/2015. Would not want extended life support. Does want CPR and life support if deemed reversible/temporary. Wife then daughter are HCPOA.  Seat belt use discussed Sunscreen use discussed. No changing moles noted. Ex smoker - quit 02/2016. Wife still smokes.  Alcohol - no  Caffeine: 1 cup decaf/week Lives with wife Occupation: retired, worked Press photographer Activity: limited by dyspnea and neuropathy  Diet: healthy, good water, fruits/vegetables daily, red meat 1x/wk   Relevant past medical, surgical, family and social history reviewed and updated as indicated. Interim medical history since our last visit reviewed. Allergies and medications reviewed and updated. Outpatient Medications Prior to Visit  Medication Sig Dispense Refill  . bisacodyl (DULCOLAX) 5 MG EC tablet Take 5 mg by mouth once. X 4 for colon 12-6    . Cyanocobalamin (B-12 PO) Take 1 tablet by mouth daily.    Marland Kitchen gabapentin (NEURONTIN) 300 MG capsule Take 1 capsule (300 mg total) by mouth 3 (three) times daily. (Patient taking differently: Take 300 mg by mouth 3 (three) times daily. 1 tablet in am,  1 tablet at lunch, and 2 tablets at bedtime (600 mg)) 90 capsule 3  . HYDROcodone-acetaminophen (NORCO) 5-325 MG tablet Take 1 tablet by mouth every 8 (eight) hours as needed for moderate pain. 90 tablet 0  . Magnesium Cl-Calcium Carbonate (SLOW-MAG PO) Take 1 tablet by mouth daily.    . Multiple Vitamin (MULTI-VITAMINS) TABS Take by  mouth.    . polyethylene glycol (MIRALAX / GLYCOLAX) packet Take 17 g by mouth daily as needed for moderate constipation.    . polyethylene glycol powder (MIRALAX) powder Take 1 Container by mouth once. 238 grams for colon 12-6    . primidone (MYSOLINE) 50 MG tablet Take 50 mg by mouth at bedtime.   1  . VITAMIN E PO Take 1 tablet by mouth daily.    Marland Kitchen lisinopril (PRINIVIL,ZESTRIL) 10 MG tablet Take 1 tablet (10 mg total) by mouth every evening. 90 tablet 3  . metoprolol tartrate  (LOPRESSOR) 25 MG tablet Take 0.5 tablets (12.5 mg total) by mouth 2 (two) times daily. 90 tablet 3   No facility-administered medications prior to visit.      Per HPI unless specifically indicated in ROS section below Review of Systems  Constitutional: Negative for activity change, appetite change, chills, fatigue, fever and unexpected weight change.  HENT: Negative for hearing loss.   Eyes: Negative for visual disturbance.  Respiratory: Positive for cough, shortness of breath and wheezing. Negative for chest tightness.   Cardiovascular: Negative for chest pain, palpitations and leg swelling.  Gastrointestinal: Positive for constipation, diarrhea and nausea. Negative for abdominal distention, abdominal pain, blood in stool and vomiting.  Genitourinary: Negative for difficulty urinating and hematuria.  Musculoskeletal: Positive for arthralgias, back pain and myalgias. Negative for neck pain.  Skin: Negative for rash.  Neurological: Negative for dizziness, seizures, syncope and headaches.       Neuropathy  Hematological: Negative for adenopathy. Does not bruise/bleed easily.  Psychiatric/Behavioral: Negative for dysphoric mood. The patient is not nervous/anxious.        Objective:    BP (!) 138/58 (BP Location: Right Arm, Cuff Size: Normal)   Pulse 77   Temp 98 F (36.7 C) (Oral)   Ht 5' 5.5" (1.664 m)   Wt 218 lb 8 oz (99.1 kg)   SpO2 92%   BMI 35.81 kg/m   Wt Readings from Last 3 Encounters:  07/03/17 218 lb 8 oz (99.1 kg)  05/01/17 224 lb (101.6 kg)  03/25/17 224 lb 9.6 oz (101.9 kg)    Physical Exam  Constitutional: He is oriented to person, place, and time. He appears well-developed and well-nourished. No distress.  HENT:  Head: Normocephalic and atraumatic.  Right Ear: Hearing, tympanic membrane, external ear and ear canal normal.  Left Ear: Hearing, tympanic membrane, external ear and ear canal normal.  Nose: Nose normal.  Mouth/Throat: Uvula is midline, oropharynx  is clear and moist and mucous membranes are normal. No oropharyngeal exudate, posterior oropharyngeal edema or posterior oropharyngeal erythema.  Eyes: Conjunctivae and EOM are normal. Pupils are equal, round, and reactive to light. No scleral icterus.  Neck: Normal range of motion. Neck supple. Carotid bruit is not present. No thyromegaly present.  Cardiovascular: Normal rate, regular rhythm, normal heart sounds and intact distal pulses.  No murmur heard. Pulses:      Radial pulses are 2+ on the right side, and 2+ on the left side.  Pulmonary/Chest: Effort normal and breath sounds normal. No respiratory distress. He has no wheezes. He has no rales.  Abdominal: Soft. Bowel sounds are normal. He exhibits no distension and no mass. There is no tenderness. There is no rebound and no guarding.  Musculoskeletal: Normal range of motion. He exhibits no edema.  Lymphadenopathy:    He has no cervical adenopathy.  Neurological: He is alert and oriented to person, place, and time.  CN grossly intact, station  and gait intact Declines to do recall Calculation 5/5 D-L-R-[id number]-W  Skin: Skin is warm and dry. No rash noted.  Psychiatric: He has a normal mood and affect. His behavior is normal. Judgment and thought content normal.  Nursing note and vitals reviewed.  Results for orders placed or performed in visit on 07/01/17  CBC with Differential/Platelet  Result Value Ref Range   WBC 7.9 4.0 - 10.5 K/uL   RBC 4.94 4.22 - 5.81 Mil/uL   Hemoglobin 13.6 13.0 - 17.0 g/dL   HCT 43.2 39.0 - 52.0 %   MCV 87.5 78.0 - 100.0 fl   MCHC 31.4 30.0 - 36.0 g/dL   RDW 17.3 (H) 11.5 - 15.5 %   Platelets 316.0 150.0 - 400.0 K/uL   Neutrophils Relative % 65.7 43.0 - 77.0 %   Lymphocytes Relative 21.0 12.0 - 46.0 %   Monocytes Relative 8.0 3.0 - 12.0 %   Eosinophils Relative 4.1 0.0 - 5.0 %   Basophils Relative 1.2 0.0 - 3.0 %   Neutro Abs 5.2 1.4 - 7.7 K/uL   Lymphs Abs 1.7 0.7 - 4.0 K/uL   Monocytes Absolute 0.6 0.1  - 1.0 K/uL   Eosinophils Absolute 0.3 0.0 - 0.7 K/uL   Basophils Absolute 0.1 0.0 - 0.1 K/uL  PSA  Result Value Ref Range   PSA 0.64 0.10 - 4.00 ng/mL  Hemoglobin A1c  Result Value Ref Range   Hgb A1c MFr Bld 7.4 (H) 4.6 - 6.5 %  Lipid panel  Result Value Ref Range   Cholesterol 193 0 - 200 mg/dL   Triglycerides 130.0 0.0 - 149.0 mg/dL   HDL 46.10 >39.00 mg/dL   VLDL 26.0 0.0 - 40.0 mg/dL   LDL Cholesterol 121 (H) 0 - 99 mg/dL   Total CHOL/HDL Ratio 4    NonHDL 147.34   Comprehensive metabolic panel  Result Value Ref Range   Sodium 138 135 - 145 mEq/L   Potassium 4.6 3.5 - 5.1 mEq/L   Chloride 95 (L) 96 - 112 mEq/L   CO2 39 (H) 19 - 32 mEq/L   Glucose, Bld 145 (H) 70 - 99 mg/dL   BUN 19 6 - 23 mg/dL   Creatinine, Ser 0.67 0.40 - 1.50 mg/dL   Total Bilirubin 0.5 0.2 - 1.2 mg/dL   Alkaline Phosphatase 110 39 - 117 U/L   AST 33 0 - 37 U/L   ALT 43 0 - 53 U/L   Total Protein 7.4 6.0 - 8.3 g/dL   Albumin 4.0 3.5 - 5.2 g/dL   Calcium 9.0 8.4 - 10.5 mg/dL   GFR 122.26 >60.00 mL/min      Assessment & Plan:   Problem List Items Addressed This Visit    Advanced care planning/counseling discussion    Advanced directives: previously reviewed, forms scanned 04/07/2015. Would not want extended life support. Does want CPR and life support if deemed reversible/temporary. Wife then daughter are HCPOA.       COPD with hypoxia ([place])    Gold stabe III by last PFTs 2016. Last saw Dr Mortimer Fries 07/2015. Not on regular respiratory medication at this time. Endorses ongoing hypoxia for the last several years. We have not evaluated pt recently for this. Reviewed recent colonoscopy notes. Declines pulm f/u at this time but agrees to set up home oxygen trial and will price out spiriva (states incruse elipta was most effective to help dyspnea).  Pulse ox at rest 84%, improves to 92% on 2L [place]  Relevant Medications   tiotropium (SPIRIVA HANDIHALER) 18 MCG inhalation capsule   Other Relevant Orders     For home use only DME oxygen   Health maintenance examination    Preventative protocols reviewed and updated unless pt declined. Discussed healthy diet and lifestyle.       HTN (hypertension)    Chronic, stable. Continue current regimen which was refilled today.       Relevant Medications   lisinopril (PRINIVIL,ZESTRIL) 10 MG tablet   metoprolol tartrate (LOPRESSOR) 25 MG tablet   Malignant neoplasm of right upper lobe of lung ([place])   Medicare annual wellness visit, subsequent - Primary    I have personally reviewed the Medicare Annual Wellness questionnaire and have noted 1. The patient's medical and social history 2. Their use of alcohol, tobacco or illicit drugs 3. Their current medications and supplements 4. The patient's functional ability including ADL's, fall risks, home safety risks and hearing or visual impairment. Cognitive function has been assessed and addressed as indicated.  5. Diet and physical activity 6. Evidence for depression or mood disorders The patients weight, height, BMI have been recorded in the chart. I have made referrals, counseling and provided education to the patient based on review of the above and I have provided the pt with a written personalized care plan for preventive services. Provider list updated.. See scanned questionairre as needed for further documentation. Reviewed preventative protocols and updated unless pt declined.       PAD (peripheral artery disease) ([place])    Will need to consider starting aspirin, statin.       Relevant Medications   lisinopril (PRINIVIL,ZESTRIL) 10 MG tablet   metoprolol tartrate (LOPRESSOR) 25 MG tablet   Peripheral neuropathy due to chemotherapy ([place])    Actually improved after acupuncture. Sees neurology.      Personal history of colonic polyps - adenomas    Colonoscopy cancelled due to hypoxia. Will defer at this time.       Primary cancer of bladder ([place])    Stable period, followed Q6 mo by  urology [name])       Other Visit Diagnoses    Need for influenza vaccination       Relevant Orders   Flu Vaccine QUAD 6+ mos PF IM (Fluarix Quad PF) (Completed)       Follow up plan: Return in about 3 months (around 10/01/2017) for follow up visit.  [name], MD

## 2017-07-03 NOTE — Assessment & Plan Note (Signed)
Preventative protocols reviewed and updated unless pt declined. Discussed healthy diet and lifestyle.  

## 2017-07-03 NOTE — Assessment & Plan Note (Signed)
Chronic, stable. Continue current regimen which was refilled today.

## 2017-07-03 NOTE — Assessment & Plan Note (Signed)
Stable period, followed Q6 mo by urology [name])

## 2017-07-03 NOTE — Assessment & Plan Note (Signed)

## 2017-07-03 NOTE — Telephone Encounter (Signed)
Copied from [place]. Topic: Quick Communication - See Telephone Encounter >> Jul 03, 2017 12:57 PM [place], Laban Emperor wrote: CRM for notification. See Telephone encounter for:   [facility] per pt request.  [id number] [address], [place] (Phone) [phone] (Fax)    07/03/17.

## 2017-07-03 NOTE — ACP (Advance Care Planning) (Signed)
Advanced directives: previously reviewed, forms scanned 04/07/2015. Would not want extended life support. Does want CPR and life support if deemed reversible/temporary. Wife then daughter are HCPOA.

## 2017-07-03 NOTE — Assessment & Plan Note (Signed)
Actually improved after acupuncture. Sees neurology.

## 2017-07-03 NOTE — Assessment & Plan Note (Signed)
Advanced directives: previously reviewed, forms scanned 04/07/2015. Would not want extended life support. Does want CPR and life support if deemed reversible/temporary. Wife then daughter are HCPOA.

## 2017-07-04 ENCOUNTER — Telehealth: Payer: Self-pay | Admitting: *Deleted

## 2017-07-04 NOTE — Telephone Encounter (Signed)
Copied from [place] [id number]. Topic: Inquiry >> Jul 04, 2017 12:41 PM Vernona Rieger wrote: Corene Cornea from [facility] wants to know if the fax that he sent on yesterday to Dr Darnell Level could be filled out for the O2 for the patient & sent back so they can deliver it to the patient. He said he will send it again right now

## 2017-07-04 NOTE — Telephone Encounter (Signed)
I filled it out today and placed in my out box around 2pm.

## 2017-07-04 NOTE — Telephone Encounter (Signed)
I have not seen this fax yet.  Dr. Darnell Level, was it given to you?

## 2017-07-05 ENCOUNTER — Telehealth: Payer: Self-pay | Admitting: Family Medicine

## 2017-07-05 ENCOUNTER — Telehealth: Payer: Self-pay

## 2017-07-05 DIAGNOSIS — J449 Chronic obstructive pulmonary disease, unspecified: Secondary | ICD-10-CM | POA: Diagnosis not present

## 2017-07-05 NOTE — Telephone Encounter (Signed)
Note printed out.  Rx written - is this all I need to do?

## 2017-07-05 NOTE — Telephone Encounter (Signed)
Copied from [place]. Topic: General - Other >> Jul 05, 2017 10:12 AM Lolita Rieger, RMA wrote: Reason for CRM: Marylyn Ishihara from [facility] supply called and stated that he needs an existing order for oxygen concentrator and and electronically signed recent office note faxed to [phone]

## 2017-07-05 NOTE — Telephone Encounter (Signed)
I agree with patient - I recommended 24/7 oxygen via nasal cannula at 2L. He needs supply available both upstairs and downstairs.  I've asked Rena to touch base with O2 company.  Thank you

## 2017-07-05 NOTE — Telephone Encounter (Signed)
I called [facility] and spoke with Dearia;  Pt will be provided with a stationary oxygen concentrator (ins will only approve one concentrator per pt) and the 2 tanks are refillable tanks that can be refilled by an attachment to the oxygen concentrator to be used while going up and downstairs and while pt is in another part of his home. I spoke with the pt and pt voiced understand. Pt is not sure he wants to carry tanks up and down stairs; Dearia at [facility] said there is an available portable concentrator unit called simply go portable O2 concentrator which is on wheels. This can be ordered on the same form that was previously sent (will give Lattie Haw the form back that Dr Darnell Level gave me in case needed for this portable concentrator.) At the bottom of that form under other equipment ordered will need to write in simply go portable O2 concentrator. To get the portable concentrator that goes over the shoulder some one from [facility] will have to go out to do evaluation to see if pt is a candidate for that and a new set of forms would have to be filled out. At this time pt is going to contact his ins co to discuss his coverage for different O2 units and pt will cb to [facility] to let them bring out the stationary oxygen concentrator and 2 refillable tanks. Pt will call [facility] back if any thing further is needed. At this time there is nothing more Dr Darnell Level needs to do. FYI to Dr Darnell Level.

## 2017-07-05 NOTE — Telephone Encounter (Signed)
Noted  

## 2017-07-05 NOTE — Telephone Encounter (Signed)
I am not sure if this is the [employer] co that pt has spoken with; no contact # to call [facility] supply to verify. I spoke with pts wife (DPR signed) and she said the [place] is a representative for the [employer] co and to give any information needed to them.

## 2017-07-05 NOTE — Telephone Encounter (Signed)
Copied from [place] [phone]. Topic: General - Other >> Jul 04, 2017  5:07 PM Patrice Paradise wrote: Reason for CRM: Patient is requesting a call back from Dr. Danise Mina, the call in reference to his oxygen and the usual. Patient can be reached @ [phone]

## 2017-07-05 NOTE — Telephone Encounter (Signed)
Pt spoke with ins co and pt has decided to get a mobile unit that will better meet his needs; the portable oxygen concentrator is only 5 lbs, will deliver up to 5L of O2 if needed; can run off AC or portable battery that will last 8 hrs and is rechargable. [employer] will be contacting Dr Darnell Level for more information. Pt is aware cost to him will be around $2000.00 but pt said it will definitely meet his needs and he will be mobile. Pt is not going to call [facility] to get them to deliver stationary oxygen concentrator. FYI to Dr Darnell Level.

## 2017-07-05 NOTE — Telephone Encounter (Signed)
I spoke with pt; pt and his wife got the understanding at the 07/03/17 office visit that Dr Darnell Level wanted pt on O2 24/7. The rep from oxygen supply co said that he was to deliver 2 tanks with capacity of O2 for 3 1/2 hs each. Pt also said he stays upstairs most of the time but is downstairs also and needs O2 availability upstairs and downstairs. Pt put O2 delivery on hold with oxygen co until resolved. Pt request cb.

## 2017-07-08 NOTE — Telephone Encounter (Signed)
Faxed OV note and rx to [facility].  Spoke with Loma Sousa at [facility] [phone]).  Confirmed they received the info and states they have everything they need at this time to process pt's order. FYI to Dr. Darnell Level.

## 2017-07-16 ENCOUNTER — Other Ambulatory Visit: Payer: Self-pay | Admitting: *Deleted

## 2017-07-16 DIAGNOSIS — C3491 Malignant neoplasm of unspecified part of right bronchus or lung: Secondary | ICD-10-CM

## 2017-07-16 DIAGNOSIS — G620 Drug-induced polyneuropathy: Secondary | ICD-10-CM

## 2017-07-16 DIAGNOSIS — C3411 Malignant neoplasm of upper lobe, right bronchus or lung: Secondary | ICD-10-CM

## 2017-07-16 DIAGNOSIS — T451X5A Adverse effect of antineoplastic and immunosuppressive drugs, initial encounter: Secondary | ICD-10-CM

## 2017-07-16 MED ORDER — HYDROCODONE-ACETAMINOPHEN 5-325 MG PO TABS: 1.0000 | ORAL_TABLET | Freq: Three times a day (TID) | ORAL | 0 refills | Status: DC | PRN

## 2017-07-22 DIAGNOSIS — H353132 Nonexudative age-related macular degeneration, bilateral, intermediate dry stage: Secondary | ICD-10-CM | POA: Diagnosis not present

## 2017-08-09 DIAGNOSIS — E538 Deficiency of other specified B group vitamins: Secondary | ICD-10-CM | POA: Diagnosis not present

## 2017-08-09 DIAGNOSIS — G250 Essential tremor: Secondary | ICD-10-CM | POA: Diagnosis not present

## 2017-08-12 DIAGNOSIS — H353231 Exudative age-related macular degeneration, bilateral, with active choroidal neovascularization: Secondary | ICD-10-CM | POA: Diagnosis not present

## 2017-08-12 DIAGNOSIS — H353211 Exudative age-related macular degeneration, right eye, with active choroidal neovascularization: Secondary | ICD-10-CM | POA: Diagnosis not present

## 2017-08-12 DIAGNOSIS — H353221 Exudative age-related macular degeneration, left eye, with active choroidal neovascularization: Secondary | ICD-10-CM | POA: Diagnosis not present

## 2017-08-26 ENCOUNTER — Other Ambulatory Visit: Payer: Self-pay | Admitting: *Deleted

## 2017-08-26 DIAGNOSIS — G620 Drug-induced polyneuropathy: Secondary | ICD-10-CM

## 2017-08-26 DIAGNOSIS — C3411 Malignant neoplasm of upper lobe, right bronchus or lung: Secondary | ICD-10-CM

## 2017-08-26 DIAGNOSIS — C3491 Malignant neoplasm of unspecified part of right bronchus or lung: Secondary | ICD-10-CM

## 2017-08-26 DIAGNOSIS — T451X5A Adverse effect of antineoplastic and immunosuppressive drugs, initial encounter: Secondary | ICD-10-CM

## 2017-08-27 MED ORDER — HYDROCODONE-ACETAMINOPHEN 5-325 MG PO TABS: 1.0000 | ORAL_TABLET | Freq: Three times a day (TID) | ORAL | 0 refills | Status: DC | PRN

## 2017-08-29 ENCOUNTER — Other Ambulatory Visit: Payer: Medicare Other

## 2017-09-05 ENCOUNTER — Encounter: Payer: Self-pay | Admitting: Urology

## 2017-09-05 ENCOUNTER — Ambulatory Visit: Payer: Medicare Other | Admitting: Urology

## 2017-09-05 VITALS — BP 152/69 | HR 87 | Ht 65.5 in | Wt 212.9 lb

## 2017-09-05 DIAGNOSIS — C679 Malignant neoplasm of bladder, unspecified: Secondary | ICD-10-CM

## 2017-09-05 LAB — URINALYSIS, COMPLETE
BILIRUBIN UA: NEGATIVE
GLUCOSE, UA: NEGATIVE
KETONES UA: NEGATIVE
LEUKOCYTES UA: NEGATIVE
Nitrite, UA: NEGATIVE
PROTEIN UA: NEGATIVE
SPEC GRAV UA: 1.02 (ref 1.005–1.030)
UUROB: 0.2 mg/dL (ref 0.2–1.0)
pH, UA: 5.5 (ref 5.0–7.5)

## 2017-09-05 LAB — MICROSCOPIC EXAMINATION

## 2017-09-05 MED ORDER — CIPROFLOXACIN HCL 500 MG PO TABS
500.0000 mg | ORAL_TABLET | Freq: Once | ORAL | Status: AC
  Administered 2017-09-05: 500 mg via ORAL

## 2017-09-05 MED ORDER — LIDOCAINE HCL 2 % EX GEL
1.0000 "application " | Freq: Once | CUTANEOUS | Status: AC
  Administered 2017-09-05: 1 via URETHRAL

## 2017-09-05 NOTE — Progress Notes (Signed)
   09/05/17  CC:  Chief Complaint  Patient presents with  . Cysto    HPI: The patient is a 77 year old gentleman with a past medical history of high-grade 3 cm pTa bladder cancer with no muscle specimen. This was originally diagnosed in October 2016. He underwent reresection in February 2017 which showed no evidence of disease. He has not undergone intravesical immunotherapy because he hadbeen undergoing Taxol-based chemotherapy for a primary lung cancer after his first resection. He ended up not undergoing BCG due to his lung cancer. He has had no evidence of recurrence since primary resection.   Blood pressure (!) 152/69, pulse 87, height 5' 5.5" (1.664 m), weight 212 lb 14.4 oz (96.6 kg). NED. A&Ox3.   No respiratory distress   Abd soft, NT, ND Normal phallus with bilateral descended testicles  Cystoscopy Procedure Note  Patient identification was confirmed, informed consent was obtained, and patient was prepped using Betadine solution.  Lidocaine jelly was administered per urethral meatus.    Preoperative abx where received prior to procedure.     Pre-Procedure: - Inspection reveals a normal caliber ureteral meatus.  Procedure: The flexible cystoscope was introduced without difficulty - No urethral strictures/lesions are present. - Enlarged prostate  - Normal bladder neck - Bilateral ureteral orifices identified - Bladder mucosa  reveals no ulcers, tumors, or lesions - No bladder stones - No trabeculation  Retroflexion shows no intravesical lobe  Post-Procedure: - Patient tolerated the procedure well  Assessment/ Plan:  1. High-grade pTa TCC of the bladder -No evidence of disease. Continue increase cystoscopy interval to q6 months. Will need this until September 2020. Can increase interval after that time if he continues to be disease free.

## 2017-09-09 LAB — HM DIABETES EYE EXAM

## 2017-09-10 DIAGNOSIS — H353221 Exudative age-related macular degeneration, left eye, with active choroidal neovascularization: Secondary | ICD-10-CM | POA: Diagnosis not present

## 2017-09-10 DIAGNOSIS — H353231 Exudative age-related macular degeneration, bilateral, with active choroidal neovascularization: Secondary | ICD-10-CM | POA: Diagnosis not present

## 2017-09-10 DIAGNOSIS — H353211 Exudative age-related macular degeneration, right eye, with active choroidal neovascularization: Secondary | ICD-10-CM | POA: Diagnosis not present

## 2017-09-23 ENCOUNTER — Ambulatory Visit
Admission: RE | Admit: 2017-09-23 | Discharge: 2017-09-23 | Disposition: A | Discharge status: Home / Self Care | Payer: Medicare Other | Source: Ambulatory Visit | Attending: Internal Medicine | Admitting: Internal Medicine

## 2017-09-23 DIAGNOSIS — R918 Other nonspecific abnormal finding of lung field: Secondary | ICD-10-CM | POA: Insufficient documentation

## 2017-09-23 DIAGNOSIS — J439 Emphysema, unspecified: Secondary | ICD-10-CM | POA: Diagnosis not present

## 2017-09-23 DIAGNOSIS — I2510 Atherosclerotic heart disease of native coronary artery without angina pectoris: Secondary | ICD-10-CM | POA: Insufficient documentation

## 2017-09-23 DIAGNOSIS — I700 Atherosclerosis of aorta: Secondary | ICD-10-CM | POA: Diagnosis not present

## 2017-09-23 DIAGNOSIS — C3411 Malignant neoplasm of upper lobe, right bronchus or lung: Secondary | ICD-10-CM

## 2017-09-23 HISTORY — DX: Type 2 diabetes mellitus without complications: E11.9

## 2017-09-23 LAB — POCT I-STAT CREATININE: Creatinine, Ser: 0.7 mg/dL (ref 0.61–1.24)

## 2017-09-23 MED ORDER — IOHEXOL 300 MG/ML  SOLN
75.0000 mL | Freq: Once | INTRAMUSCULAR | Status: AC | PRN
  Administered 2017-09-23: 75 mL via INTRAVENOUS

## 2017-09-25 ENCOUNTER — Inpatient Hospital Stay: Payer: Medicare Other | Admitting: Internal Medicine

## 2017-09-25 ENCOUNTER — Inpatient Hospital Stay: Payer: Medicare Other | Attending: Internal Medicine

## 2017-09-25 ENCOUNTER — Encounter: Payer: Self-pay | Admitting: Internal Medicine

## 2017-09-25 VITALS — BP 154/66 | HR 70 | Temp 98.5°F | Resp 16 | Wt 215.0 lb

## 2017-09-25 DIAGNOSIS — G629 Polyneuropathy, unspecified: Secondary | ICD-10-CM | POA: Diagnosis not present

## 2017-09-25 DIAGNOSIS — F1011 Alcohol abuse, in remission: Secondary | ICD-10-CM | POA: Diagnosis not present

## 2017-09-25 DIAGNOSIS — Z923 Personal history of irradiation: Secondary | ICD-10-CM | POA: Insufficient documentation

## 2017-09-25 DIAGNOSIS — E785 Hyperlipidemia, unspecified: Secondary | ICD-10-CM

## 2017-09-25 DIAGNOSIS — C3411 Malignant neoplasm of upper lobe, right bronchus or lung: Secondary | ICD-10-CM | POA: Diagnosis not present

## 2017-09-25 DIAGNOSIS — D701 Agranulocytosis secondary to cancer chemotherapy: Secondary | ICD-10-CM | POA: Insufficient documentation

## 2017-09-25 DIAGNOSIS — G250 Essential tremor: Secondary | ICD-10-CM

## 2017-09-25 DIAGNOSIS — I2510 Atherosclerotic heart disease of native coronary artery without angina pectoris: Secondary | ICD-10-CM

## 2017-09-25 DIAGNOSIS — Z79899 Other long term (current) drug therapy: Secondary | ICD-10-CM | POA: Insufficient documentation

## 2017-09-25 DIAGNOSIS — Z87891 Personal history of nicotine dependence: Secondary | ICD-10-CM | POA: Insufficient documentation

## 2017-09-25 DIAGNOSIS — Z86010 Personal history of colonic polyps: Secondary | ICD-10-CM | POA: Diagnosis not present

## 2017-09-25 DIAGNOSIS — K219 Gastro-esophageal reflux disease without esophagitis: Secondary | ICD-10-CM | POA: Diagnosis not present

## 2017-09-25 DIAGNOSIS — E119 Type 2 diabetes mellitus without complications: Secondary | ICD-10-CM | POA: Insufficient documentation

## 2017-09-25 DIAGNOSIS — J449 Chronic obstructive pulmonary disease, unspecified: Secondary | ICD-10-CM | POA: Insufficient documentation

## 2017-09-25 DIAGNOSIS — Z9981 Dependence on supplemental oxygen: Secondary | ICD-10-CM | POA: Insufficient documentation

## 2017-09-25 DIAGNOSIS — I10 Essential (primary) hypertension: Secondary | ICD-10-CM | POA: Diagnosis not present

## 2017-09-25 DIAGNOSIS — Z9221 Personal history of antineoplastic chemotherapy: Secondary | ICD-10-CM

## 2017-09-25 DIAGNOSIS — N400 Enlarged prostate without lower urinary tract symptoms: Secondary | ICD-10-CM | POA: Diagnosis not present

## 2017-09-25 DIAGNOSIS — G8929 Other chronic pain: Secondary | ICD-10-CM | POA: Diagnosis not present

## 2017-09-25 LAB — COMPREHENSIVE METABOLIC PANEL
ALBUMIN: 3.8 g/dL (ref 3.5–5.0)
ALK PHOS: 92 U/L (ref 38–126)
ALT: 29 U/L (ref 17–63)
AST: 31 U/L (ref 15–41)
Anion gap: 8 (ref 5–15)
BILIRUBIN TOTAL: 0.5 mg/dL (ref 0.3–1.2)
BUN: 17 mg/dL (ref 6–20)
CO2: 29 mmol/L (ref 22–32)
CREATININE: 0.71 mg/dL (ref 0.61–1.24)
Calcium: 8.9 mg/dL (ref 8.9–10.3)
Chloride: 100 mmol/L — ABNORMAL LOW (ref 101–111)
GFR calc Af Amer: >60 mL/min (ref >60)
GFR calc non Af Amer: >60 mL/min (ref >60)
GLUCOSE: 127 mg/dL — AB (ref 65–99)
POTASSIUM: 4.4 mmol/L (ref 3.5–5.1)
Sodium: 137 mmol/L (ref 135–145)
TOTAL PROTEIN: 7.8 g/dL (ref 6.5–8.1)

## 2017-09-25 LAB — CBC WITH DIFFERENTIAL/PLATELET
BASOS ABS: 0 10*3/uL (ref 0–0.1)
Basophils Relative: 1 %
EOS PCT: 4 %
Eosinophils Absolute: 0.2 10*3/uL (ref 0–0.7)
HCT: 39.3 % — ABNORMAL LOW (ref 40.0–52.0)
Hemoglobin: 13.2 g/dL (ref 13.0–18.0)
LYMPHS PCT: 21 %
Lymphs Abs: 1.2 10*3/uL (ref 1.0–3.6)
MCH: 29.5 pg (ref 26.0–34.0)
MCHC: 33.6 g/dL (ref 32.0–36.0)
MCV: 87.9 fL (ref 80.0–100.0)
Monocytes Absolute: 0.5 10*3/uL (ref 0.2–1.0)
Monocytes Relative: 9 %
Neutro Abs: 4 10*3/uL (ref 1.4–6.5)
Neutrophils Relative %: 65 %
PLATELETS: 240 10*3/uL (ref 150–440)
RBC: 4.47 MIL/uL (ref 4.40–5.90)
RDW: 19.7 % — ABNORMAL HIGH (ref 11.5–14.5)
WBC: 6 10*3/uL (ref 3.8–10.6)

## 2017-09-25 NOTE — Progress Notes (Signed)
[place] NOTE  Patient Care Team: [name], MD as PCP - General (Family Medicine) Cammie Sickle, MD as Consulting Physician (Internal Medicine) [name], MD as Consulting Physician (Urology) Vladimir Crofts, MD as Consulting Physician (Neurology) Minna Merritts, MD as Consulting Physician (Cardiology) Noreene Filbert, MD as Referring Physician (Radiation Oncology) [name], MD as Referring Physician (Cardiothoracic Surgery)  CHIEF COMPLAINTS/PURPOSE OF CONSULTATION:   Oncology History    # OCT 2016- RUL POORLY DIFF CA [clinically non-small cell] ; STAGE IIB [T3-2 separate nodules in RUL; N0] [s/p CT guided bx]- s/p RT [finished mid jan 2017]; FEB 2017-CT scan- regression of RUL nodules;   # March 2017- Start carbo-taxol q 3 w x4; June 2017- CT- Improvement of RUL nodule/ Stable RUL nodules. OCT 2017- PR   # OCT 2016- 2 sub cm nodules in Right LL- Feb CT 2017- STABLE; June 2017- resolved  # May- 2017- G2 PN  # OCT 2016-NON-invasive bladder urothelial cancer; high grade [Dr. [name]; s/p TURBT [Feb 2017- NEG]; declined BCG  # Pneumothorax [oct 2016 s/p CT Bx];  # MOLECULAR STUDIES- PDL-1 by IHC/keytruda- 11-20%.        Malignant neoplasm of right upper lobe of lung ([place])   12/20/2015 Initial Diagnosis    Malignant neoplasm of right upper lobe of lung (HCC)      HISTORY OF PRESENTING ILLNESS:  Joseph Graham 77 y.o. male with above history of stage II T3 N0 right upper lobe non-small cell lung cancer-status post radiation followed by adjuvant chemotherapy is here to review the results of his CT scan.  Patient states that in the interim he has been O2 by his PCP for his ongoing COPD.  He feels his breathing is much improved.   His leg swelling is improved; he has been using compression stockings.  He denies any headaches.  He continues to have ongoing pain in his feet from the neuropathy.  However denies this is  getting any worse.  He continues to take hydrocodone 1-2 pills a day.  ROS: A complete 10 point review of system is done which is negative for mentioned above in history of present illness.  MEDICAL HISTORY:  Past Medical History:  Diagnosis Date  . Alcohol abuse, in remission    remote  . Alcohol abuse, in remission    remote   . Arthritis    BACK AND NECK  . Benign essential tremor    improved on metoprolol and gabapentin  . BPH (benign prostatic hypertrophy) 12/30/2012  . Cardiac arrhythmia 03/2010   h/o a flutter and CM, s/p ablation, normal stress test  . Cataract    bilat removed   . Chemotherapy-induced neutropenia ([place]) 09/23/2015  . Chronic low back pain    MRI 2004, multilevel DDD  . Colon polyps    next colonoscopy due around 2018  . Complication of anesthesia    DID NOT GET COMPLETELY NUMB WITH TONSILLECTOMY  . COPD (chronic obstructive pulmonary disease) ([place])   . Coronary artery disease   . Diabetes mellitus without complication ([place])   . Dyslipidemia    mild off meds  . GERD (gastroesophageal reflux disease)   . HTN (hypertension)   . Multiple pulmonary nodules 02/2015  . Neuromuscular disorder ([place])    neuropathy from chemo....in feet  . Peripheral neuropathy due to chemotherapy ([place]) 12/23/2015   Saw Dr Manuella Ghazi, on gabapentin and cymbalta with hydrocodone PRN (03/2016)  . Personal history of tobacco use,  presenting hazards to health 02/18/2015   quit 06/2011, restarted 2014  . Pneumothorax after biopsy 04/01/2015  . Primary cancer of bladder ([place]) 2016   [place]  . Primary lung cancer ([place]) 2016   port a cath removed 03/2016  . Shortness of breath dyspnea    OCC WITH EXERTION    SURGICAL HISTORY: Past Surgical History:  Procedure Laterality Date  . A FLUTTER ABLATION  03/2010  . carotid US  03/2010   WNL  . CATARACT EXTRACTION    . COLONOSCOPY  09/2011   polyps, rpt due 5 yrs, mild diverticulosis [name])  . CYSTOSCOPY W/ RETROGRADES Bilateral  04/06/2015   Procedure: CYSTOSCOPY WITH RETROGRADE PYELOGRAM;  Surgeon: [name], MD;  Location: [facility] ORS;  Service: Urology;  Laterality: Bilateral;  . ELECTROMAGNETIC NAVIGATION BROCHOSCOPY N/A 03/02/2015   Procedure: ELECTROMAGNETIC NAVIGATION BRONCHOSCOPY;  Surgeon: [name], MD;  Location: [facility] ORS;  Service: Cardiopulmonary;  Laterality: N/A;  . [place]  . POLYPECTOMY    . PORT-A-CATH REMOVAL Right 04/03/2016   Procedure: REMOVAL PORT-A-CATH;  Surgeon: [name], MD;  Location: [facility] ORS;  Service: General;  Laterality: Right;  . PORTACATH PLACEMENT N/A 08/10/2015   Procedure: INSERTION PORT-A-CATH;  Surgeon: [name], MD;  Location: [facility] ORS;  Service: General;  Laterality: N/A;  . [place]  . TRANSURETHRAL RESECTION OF BLADDER TUMOR N/A 04/06/2015   Procedure: TRANSURETHRAL RESECTION OF BLADDER TUMOR (TURBT) right ureteral stent placement;  Surgeon: [name], MD;  Location: [facility] ORS;  Service: Urology;  Laterality: N/A;  . TRANSURETHRAL RESECTION OF BLADDER TUMOR N/A 07/13/2015   Procedure: TRANSURETHRAL RESECTION OF BLADDER TUMOR (TURBT);  Surgeon: [name], MD;  Location: [facility] ORS;  Service: Urology;  Laterality: N/A;  . urinary stent removal  04/14/15    SOCIAL HISTORY: Patient is retired from Press photographer. He lives in [place]. Social History   Socioeconomic History  . Marital status: Married    Spouse name: Not on file  . Number of children: Not on file  . Years of education: Not on file  . Highest education level: Not on file  Occupational History  . Not on file  Social Needs  . Financial resource strain: Not on file  . Food insecurity:    Worry: Not on file    Inability: Not on file  . Transportation needs:    Medical: Not on file    Non-medical: Not on file  Tobacco Use  . Smoking status: Former Smoker    Packs/day: 1.50    Years: 61.00    Pack years: 91.50    Types: Cigarettes    Last attempt to quit:  03/08/2015    Years since quitting: 2.5  . Smokeless tobacco: Never Used  . Tobacco comment: cut back 0.5 cigarettes--stopped 03/07/15; now now chantix  Substance and Sexual Activity  . Alcohol use: No    Alcohol/week: 0.0 oz    Comment: has not drank in 37 years  . Drug use: No  . Sexual activity: Never  Lifestyle  . Physical activity:    Days per week: Not on file    Minutes per session: Not on file  . Stress: Not on file  Relationships  . Social connections:    Talks on phone: Not on file    Gets together: Not on file    Attends religious service: Not on file    Active member of club or organization: Not on file    Attends meetings of  clubs or organizations: Not on file    Relationship status: Not on file  . Intimate partner violence:    Fear of current or ex partner: Not on file    Emotionally abused: Not on file    Physically abused: Not on file    Forced sexual activity: Not on file  Other Topics Concern  . Not on file  Social History Narrative   Caffeine: 1 cup decaf/day   Lives with wife   Occupation: retired, worked Press photographer   Activity: gym 3x/wk, Immunologist, hobbies (paint, building things)   Diet: healthy, good water, fruits/vegetables daily, red meat 1x/wk    FAMILY HISTORY: Family History  Problem Relation Age of Onset  . Cervical cancer Mother 84  . Hypertension Father   . Tremor Father        and several uncles/aunts (not parkinson's)  . Stroke Brother 61  . Bladder Cancer Brother 8  . Prostate cancer Neg Hx   . Kidney cancer Neg Hx   . Colon cancer Neg Hx   . Colon polyps Neg Hx   . Esophageal cancer Neg Hx   . Rectal cancer Neg Hx     ALLERGIES:  has No Known Allergies.  MEDICATIONS:  Current Outpatient Medications  Medication Sig Dispense Refill  . Cyanocobalamin (B-12 PO) Take 1 tablet by mouth daily.    Marland Kitchen gabapentin (NEURONTIN) 300 MG capsule Take 1 capsule (300 mg total) by mouth 3 (three) times daily. (Patient taking differently: Take 300  mg by mouth 3 (three) times daily. 1 tablet in am,  1 tablet at lunch, and 2 tablets at bedtime (600 mg)) 90 capsule 3  . HYDROcodone-acetaminophen (NORCO) 5-325 MG tablet Take 1 tablet by mouth every 8 (eight) hours as needed for moderate pain. 90 tablet 0  . lisinopril (PRINIVIL,ZESTRIL) 10 MG tablet Take 1 tablet (10 mg total) by mouth every evening. 90 tablet 3  . Magnesium Cl-Calcium Carbonate (SLOW-MAG PO) Take 1 tablet by mouth daily.    . metoprolol tartrate (LOPRESSOR) 25 MG tablet Take 0.5 tablets (12.5 mg total) by mouth 2 (two) times daily. 90 tablet 3  . Multiple Vitamin (MULTI-VITAMINS) TABS Take by mouth.    . polyethylene glycol powder (MIRALAX) powder Take 1 Container by mouth once. 238 grams for colon 12-6    . primidone (MYSOLINE) 50 MG tablet Take 50 mg by mouth at bedtime.   1  . tiotropium (SPIRIVA HANDIHALER) 18 MCG inhalation capsule Place 1 capsule (18 mcg total) into inhaler and inhale at bedtime. 30 capsule 12  . VITAMIN E PO Take 1 tablet by mouth daily.     No current facility-administered medications for this visit.   Marland Kitchen  PHYSICAL EXAMINATION: ECOG PERFORMANCE STATUS: 0 - Asymptomatic  Vitals:   09/25/17 1146  BP: (!) 154/66  Pulse: 70  Resp: 16  Temp: 98.5 F (36.9 C)  SpO2: 92%   Filed Weights   09/25/17 1146  Weight: 215 lb (97.5 kg)  Pulse ox 94% on room air.  GENERAL:alert, no distress and comfortable. He is Accompanied by his wife. SKIN: skin color, texture, turgor are normal, no rashes or significant lesions EYES: normal, conjunctiva are pink and non-injected, sclera clear OROPHARYNX:no exudate, no erythema and lips, buccal mucosa, and tongue normal  NECK: supple, thyroid normal size, non-tender, without nodularity LYMPH:  no palpable lymphadenopathy in the cervical, axillary or inguinal LUNGS: clear to auscultation and percussion with normal breathing effort HEART: regular rate & rhythm and no  murmurs and no edema. ABDOMEN:abdomen soft,  non-tender and normal bowel sounds Musculoskeletal:no cyanosis of digits and no clubbing  PSYCH: alert & oriented x 3 with fluent speech NEURO: no focal motor/sensory deficits  LABORATORY DATA:  I have reviewed the data as listed Lab Results  Component Value Date   WBC 6.0 09/25/2017   HGB 13.2 09/25/2017   HCT 39.3 (L) 09/25/2017   MCV 87.9 09/25/2017   PLT 240 09/25/2017   Recent Labs    11/23/16 1045  03/25/17 1445 07/01/17 0915 09/23/17 1026 09/25/17 1110  NA 135  --  137 138  --  137  K 4.5  --  4.6 4.6  --  4.4  CL 96*  --  97* 95*  --  100*  CO2 32  --  30 39*  --  29  GLUCOSE 132*  --  111* 145*  --  127*  BUN 16  --  16 19  --  17  CREATININE 0.81   < > 0.80 0.67 0.70 0.71  CALCIUM 8.7*  --  8.9 9.0  --  8.9  GFRNONAA >60  --  >60  --   --  >60  GFRAA >60  --  >60  --   --  >60  PROT  --   --  8.2* 7.4  --  7.8  ALBUMIN  --   --  4.0 4.0  --  3.8  AST  --   --  55* 33  --  31  ALT  --   --  63 43  --  29  ALKPHOS  --   --  105 110  --  92  BILITOT  --   --  0.5 0.5  --  0.5   < > = values in this interval not displayed.    RADIOGRAPHIC STUDIES:  ASSESSMENT & PLAN:   Malignant neoplasm of right upper lobe of lung ([place]) # Right UL POORLY DIFF CA- T3 N0  Currently status post radiation [finished feb 2017];  Finished adjuvant chemotherapy [end of April 2017] April 2019-  CT scan shows continued partial response; STABLE- RUL/ RLL- stable. RML- appx 26m [see discussion below]  # RML/RLL-STABLE; too small to biopsy.  Recommend monitoring for now; recommend SBRT/ if it continues to grow.  # Peripheral neuropathy-not improving/stable. Hydrocodone q 12   Continue Neurontin 300 BID; follows up with neurology; Dr.Shah.   # COPD on O2/nasal cannula.  Stable.  # Bil LE swelling/weight gain; not worse-?PN- swelling improved on mild compression stockings.  # Follow up in 6 months/Ct scan prior.   # I reviewed the blood work- with the patient in detail; also reviewed  the imaging independently [as summarized above]; and with the patient in detail.      GCammie Sickle MD 10/01/2017 1:15 PM

## 2017-09-25 NOTE — Assessment & Plan Note (Addendum)
#   Right UL POORLY DIFF CA- T3 N0  Currently status post radiation [finished feb 2017];  Finished adjuvant chemotherapy [end of April 2017] April 2019-  CT scan shows continued partial response; STABLE- RUL/ RLL- stable. RML- appx 8mm [see discussion below]  # RML/RLL-STABLE; too small to biopsy.  Recommend monitoring for now; recommend SBRT/ if it continues to grow.  # Peripheral neuropathy-not improving/stable. Hydrocodone q 12   Continue Neurontin 300 BID; follows up with neurology; Dr.Shah.   # COPD on O2/nasal cannula.  Stable.  # Bil LE swelling/weight gain; not worse-?PN- swelling improved on mild compression stockings.  # Follow up in 6 months/Ct scan prior.   # I reviewed the blood work- with the patient in detail; also reviewed the imaging independently [as summarized above]; and with the patient in detail.

## 2017-10-01 ENCOUNTER — Ambulatory Visit: Payer: Medicare Other | Admitting: Family Medicine

## 2017-10-01 ENCOUNTER — Encounter: Payer: Self-pay | Admitting: Family Medicine

## 2017-10-01 VITALS — BP 126/82 | HR 62 | Temp 97.9°F | Ht 66.0 in | Wt 211.0 lb

## 2017-10-01 DIAGNOSIS — C3411 Malignant neoplasm of upper lobe, right bronchus or lung: Secondary | ICD-10-CM

## 2017-10-01 DIAGNOSIS — J449 Chronic obstructive pulmonary disease, unspecified: Secondary | ICD-10-CM | POA: Diagnosis not present

## 2017-10-01 DIAGNOSIS — C679 Malignant neoplasm of bladder, unspecified: Secondary | ICD-10-CM | POA: Diagnosis not present

## 2017-10-01 DIAGNOSIS — I739 Peripheral vascular disease, unspecified: Secondary | ICD-10-CM

## 2017-10-01 DIAGNOSIS — G250 Essential tremor: Secondary | ICD-10-CM | POA: Diagnosis not present

## 2017-10-01 DIAGNOSIS — R0902 Hypoxemia: Secondary | ICD-10-CM

## 2017-10-01 DIAGNOSIS — E1151 Type 2 diabetes mellitus with diabetic peripheral angiopathy without gangrene: Secondary | ICD-10-CM | POA: Diagnosis not present

## 2017-10-01 DIAGNOSIS — H3530 Unspecified macular degeneration: Secondary | ICD-10-CM

## 2017-10-01 LAB — POCT GLYCOSYLATED HEMOGLOBIN (HGB A1C): HEMOGLOBIN A1C: 6.4

## 2017-10-01 NOTE — Patient Instructions (Addendum)
A1c test today. We will refer you for diabetes classes in [place] [facility].  Congratulations on healthy diet changes and weight loss - keep it up! Return in 3-4 months for follow up visit.  Check with your pharmacy/insurance to see what glucose meter is preferred (one-touch or accuchek).

## 2017-10-01 NOTE — Progress Notes (Signed)
BP 126/82 (BP Location: Left Arm, Patient Position: Sitting, Cuff Size: Normal)   Pulse 62   Temp 97.9 F (36.6 C) (Oral)   Ht 5\' 6"  (1.676 m)   Wt 211 lb (95.7 kg)   SpO2 93% Comment: 2 L, pulsating  BMI 34.06 kg/m    CC: 3 mo f/u visit Subjective:    Patient ID: Joseph Graham, male    DOB: 1941/01/12, 77 y.o.   MRN: [id number]  HPI: Joseph Graham is a 77 y.o. male presenting on 10/01/2017 for 3 mo follow-up   Known lung cancer followed by onc - last seen last week. S/p radiation and adjuvant chemo with partial response. Known periph neuropathy trying to wean off hydrocodone, started on gabapentin 300mg  bid.   Bladder cancer sees uro Q78mo cystoscopy [name]).   COPD - supplemental O2 started last visit due to baseline hypoxia. spiriva nightly is helping as well as supplemental oxygen. Finds able to be more active when using oxygen.   Has started using graduated compression stockings - with improvement in leg pain.   DM - does not regularly check sugars. Compliant with antihyperglycemic regimen which includes: diet controlled - working on low carb diet. Has lost 7 lbs in the last 3 months. Denies hypoglycemic symptoms. Chronic paresthesias. Last diabetic eye exam 09/2017. Pneumovax: 2012. Prevnar: 2015. Glucometer brand: does not have. DSME: discussed - interested. Lab Results  Component Value Date   HGBA1C 6.4 10/01/2017   Diabetic Foot Exam - Simple   No data filed     No results found for: Derl Barrow   Relevant past medical, surgical, family and social history reviewed and updated as indicated. Interim medical history since our last visit reviewed. Allergies and medications reviewed and updated. Outpatient Medications Prior to Visit  Medication Sig Dispense Refill  . Cyanocobalamin (B-12 PO) Take 1 tablet by mouth daily.    Marland Kitchen gabapentin (NEURONTIN) 300 MG capsule Take 1 capsule (300 mg total) by mouth 3 (three) times daily. (Patient taking differently:  Take 300 mg by mouth 3 (three) times daily. 1 tablet in am,  1 tablet at lunch, and 2 tablets at bedtime (600 mg)) 90 capsule 3  . HYDROcodone-acetaminophen (NORCO) 5-325 MG tablet Take 1 tablet by mouth every 8 (eight) hours as needed for moderate pain. 90 tablet 0  . lisinopril (PRINIVIL,ZESTRIL) 10 MG tablet Take 1 tablet (10 mg total) by mouth every evening. 90 tablet 3  . Magnesium Cl-Calcium Carbonate (SLOW-MAG PO) Take 1 tablet by mouth daily.    . metoprolol tartrate (LOPRESSOR) 25 MG tablet Take 0.5 tablets (12.5 mg total) by mouth 2 (two) times daily. 90 tablet 3  . Multiple Vitamin (MULTI-VITAMINS) TABS Take by mouth.    . polyethylene glycol powder (MIRALAX) powder Take 1 Container by mouth once. 238 grams for colon 12-6    . primidone (MYSOLINE) 50 MG tablet Take 50 mg by mouth at bedtime.   1  . tiotropium (SPIRIVA HANDIHALER) 18 MCG inhalation capsule Place 1 capsule (18 mcg total) into inhaler and inhale at bedtime. 30 capsule 12  . VITAMIN E PO Take 1 tablet by mouth daily.    . bisacodyl (DULCOLAX) 5 MG EC tablet Take 5 mg by mouth once. X 4 for colon 12-6    . polyethylene glycol (MIRALAX / GLYCOLAX) packet Take 17 g by mouth daily as needed for moderate constipation.     No facility-administered medications prior to visit.      Per HPI  unless specifically indicated in ROS section below Review of Systems     Objective:    BP 126/82 (BP Location: Left Arm, Patient Position: Sitting, Cuff Size: Normal)   Pulse 62   Temp 97.9 F (36.6 C) (Oral)   Ht 5\' 6"  (1.676 m)   Wt 211 lb (95.7 kg)   SpO2 93% Comment: 2 L, pulsating  BMI 34.06 kg/m   Wt Readings from Last 3 Encounters:  10/01/17 211 lb (95.7 kg)  09/25/17 215 lb (97.5 kg)  09/05/17 212 lb 14.4 oz (96.6 kg)    Physical Exam  Constitutional: He appears well-developed and well-nourished. No distress.  On supplemental O2, has walker if needed  HENT:  Head: Normocephalic and atraumatic.  Right Ear: External  ear normal.  Left Ear: External ear normal.  Nose: Nose normal.  Mouth/Throat: Oropharynx is clear and moist. No oropharyngeal exudate.  Eyes: Pupils are equal, round, and reactive to light. Conjunctivae and EOM are normal. No scleral icterus.  Neck: Normal range of motion. Neck supple.  Cardiovascular: Normal rate, regular rhythm, normal heart sounds and intact distal pulses.  No murmur heard. Pulmonary/Chest: Effort normal and breath sounds normal. No respiratory distress. He has no wheezes. He has no rales.  Musculoskeletal: He exhibits no edema.  Compression stockings in place  Lymphadenopathy:    He has no cervical adenopathy.  Skin: Skin is warm and dry. No rash noted.  Psychiatric: He has a normal mood and affect.  Nursing note and vitals reviewed.  Results for orders placed or performed in visit on 10/01/17  POCT glycosylated hemoglobin (Hb A1C)  Result Value Ref Range   Hemoglobin A1C 6.4   HM DIABETES EYE EXAM  Result Value Ref Range   HM Diabetic Eye Exam No Retinopathy No Retinopathy      Assessment & Plan:   Problem List Items Addressed This Visit    Benign essential tremor    Continue metoprolol and primidone. Sees neurology.       COPD with hypoxia (HCC)    Improved pulse ox on 2L supplemental oxygen, this along with spiriva has improved his activity level. Continue. Consider updated spirometry at future visit.       Diabetes mellitus type 2 with peripheral artery disease ([place]) - Primary    Congratulated on healthy diet changes to date leading to weight loss and better sugar control. Continues diet controlled. A1c today 6.4% - will continue to watch without glucometer at this time. Will not start anthyperglycemic medication today. Continue strong effort at diabetic diet. Will refer for DSME.       Relevant Orders   POCT glycosylated hemoglobin (Hb A1C) (Completed)   Ambulatory referral to diabetic education   Macular degeneration of both eyes    Rapid  advancement. Sees ophtho regularly. Also taking eye vitamin (?AREDS preservision).       Malignant neoplasm of right upper lobe of lung ([place])    Continue onc f/u.       PAD (peripheral artery disease) (HCC)    Consider aspirin, statin - will need to discuss at next visit.       Primary cancer of bladder [facility])    Appreciate urology care - continue close f/u with Dr Lorina Rabon urological - likely to establish with new urologist at upcoming appt.           No orders of the defined types were placed in this encounter.  Orders Placed This Encounter  Procedures  . Ambulatory referral to  diabetic education    Referral Priority:   Routine    Referral Type:   Consultation    Referral Reason:   Specialty Services Required    Number of Visits Requested:   1  . POCT glycosylated hemoglobin (Hb A1C)  . HM DIABETES EYE EXAM    This external order was created through the Results Console.    Follow up plan: Return if symptoms worsen or fail to improve.  [name], MD

## 2017-10-01 NOTE — Assessment & Plan Note (Addendum)
Rapid advancement. Sees ophtho regularly. Also taking eye vitamin (?AREDS preservision).

## 2017-10-03 NOTE — Assessment & Plan Note (Signed)
Consider aspirin, statin - will need to discuss at next visit.

## 2017-10-03 NOTE — Assessment & Plan Note (Signed)
Continue metoprolol and primidone. Sees neurology.

## 2017-10-03 NOTE — Assessment & Plan Note (Signed)
Improved pulse ox on 2L supplemental oxygen, this along with spiriva has improved his activity level. Continue. Consider updated spirometry at future visit.

## 2017-10-03 NOTE — Assessment & Plan Note (Signed)
Continue onc f/u.

## 2017-10-03 NOTE — Assessment & Plan Note (Addendum)
Congratulated on healthy diet changes to date leading to weight loss and better sugar control. Continues diet controlled. A1c today 6.4% - will continue to watch without glucometer at this time. Will not start anthyperglycemic medication today. Continue strong effort at diabetic diet. Will refer for DSME.

## 2017-10-03 NOTE — Assessment & Plan Note (Addendum)
Appreciate urology care - continue close f/u with Dr Lorina Rabon urological - likely to establish with new urologist at upcoming appt.

## 2017-10-07 ENCOUNTER — Other Ambulatory Visit: Payer: Self-pay | Admitting: *Deleted

## 2017-10-07 DIAGNOSIS — C3491 Malignant neoplasm of unspecified part of right bronchus or lung: Secondary | ICD-10-CM

## 2017-10-07 DIAGNOSIS — C3411 Malignant neoplasm of upper lobe, right bronchus or lung: Secondary | ICD-10-CM

## 2017-10-07 DIAGNOSIS — G620 Drug-induced polyneuropathy: Secondary | ICD-10-CM

## 2017-10-07 DIAGNOSIS — T451X5A Adverse effect of antineoplastic and immunosuppressive drugs, initial encounter: Secondary | ICD-10-CM

## 2017-10-07 MED ORDER — HYDROCODONE-ACETAMINOPHEN 5-325 MG PO TABS: 1.0000 | ORAL_TABLET | Freq: Three times a day (TID) | ORAL | 0 refills | Status: DC | PRN

## 2017-10-22 DIAGNOSIS — H353221 Exudative age-related macular degeneration, left eye, with active choroidal neovascularization: Secondary | ICD-10-CM | POA: Diagnosis not present

## 2017-10-22 DIAGNOSIS — H353122 Nonexudative age-related macular degeneration, left eye, intermediate dry stage: Secondary | ICD-10-CM | POA: Diagnosis not present

## 2017-10-24 ENCOUNTER — Encounter: Payer: Self-pay | Admitting: *Deleted

## 2017-10-24 ENCOUNTER — Encounter: Payer: Medicare Other | Attending: Family Medicine | Admitting: *Deleted

## 2017-10-24 VITALS — BP 130/70 | Ht 69.0 in | Wt 214.2 lb

## 2017-10-24 DIAGNOSIS — Z6831 Body mass index (BMI) 31.0-31.9, adult: Secondary | ICD-10-CM | POA: Insufficient documentation

## 2017-10-24 DIAGNOSIS — E119 Type 2 diabetes mellitus without complications: Secondary | ICD-10-CM

## 2017-10-24 DIAGNOSIS — Z713 Dietary counseling and surveillance: Secondary | ICD-10-CM | POA: Insufficient documentation

## 2017-10-24 NOTE — Patient Instructions (Signed)
Check blood sugars 2 x day before breakfast and 2 hrs one meal 3 x week  Bring blood sugar records to the next appointment  Call your doctor for a prescription for:  1. Meter strips (type) One Touch Verio  checking  3 times per week  2. Lancets (type) One Touch Delica checking  3     times per week  Eat 3 meals day, 1 snack a day Space meals 4-6 hours apart Avoid sugar sweetened drinks (coffee, sports drinks) Complete 3 Day Food Record and bring to next appt  Return for appointment on:  Monday November 18, 2017 at 10:30 am with [facility] (dietitian)

## 2017-10-24 NOTE — Progress Notes (Signed)
Diabetes Self-Management Education  Visit Type: First/Initial  Appt. Start Time: 1410 Appt. End Time: 6073  10/24/2017  [name], identified by name and date of birth, is a 77 y.o. male with a diagnosis of Diabetes: Type 2.   ASSESSMENT  Blood pressure 130/70, height 5\' 9"  (1.753 m), weight 214 lb 3.2 oz (97.2 kg). Body mass index is 31.63 kg/m.  Diabetes Self-Management Education - 10/24/17 1539      Visit Information   Visit Type  First/Initial      Initial Visit   Diabetes Type  Type 2    Are you currently following a meal plan?  Yes    What type of meal plan do you follow?  "cut back on carbs and sweets"    Are you taking your medications as prescribed?  Yes    Date Diagnosed  "5 months ago" - labs in 2018 showed A1C of 6.7%       Health Coping   How would you rate your overall health?  Excellent      Psychosocial Assessment   Patient Belief/Attitude about Diabetes  Motivated to manage diabetes    Self-care barriers  Other (comment) Use of oxygen    Self-management support  Doctor's office;Family    Other persons present  Spouse/SO    Patient Concerns  Nutrition/Meal planning;Weight Control    Special Needs  None    Preferred Learning Style  Auditory;Visual;Hands on    [place] in progress    How often do you need to have someone help you when you read instructions, pamphlets, or other written materials from your doctor or pharmacy?  1 - Never      Pre-Education Assessment   Patient understands the diabetes disease and treatment process.  Needs Instruction    Patient understands incorporating nutritional management into lifestyle.  Needs Instruction    Patient undertands incorporating physical activity into lifestyle.  Needs Instruction    Patient understands using medications safely.  Needs Instruction    Patient understands monitoring blood glucose, interpreting and using results  Needs Instruction    Patient understands prevention,  detection, and treatment of acute complications.  Needs Instruction    Patient understands prevention, detection, and treatment of chronic complications.  Needs Instruction    Patient understands how to develop strategies to address psychosocial issues.  Needs Instruction    Patient understands how to develop strategies to promote health/change behavior.  Needs Instruction      Complications   Last HgB A1C per patient/outside source  6.4 % 10/01/17 - was 7.4% in Jan    How often do you check your blood sugar?  0 times/day (not testing) Provided One Touch Verio Flex meter and instructed in use. BG upon return demonstration was 84 mg/dL at 3:05 pm - 7 hrs pp.     Have you had a dilated eye exam in the past 12 months?  Yes    Have you had a dental exam in the past 12 months?  No    Are you checking your feet?  Yes    How many days per week are you checking your feet?  7      Dietary Intake   Breakfast  eggs, whole wheat toast; weekly he eats cereal, milk and fruit; weekly he has biscuit from fast food place; every other week he has bagel with cream cheese    Lunch  apple with peanut butter and pudding    Dinner  chicken, fish or shrimp (little beef or pork); occasional potatoes or pasta; loves rice, corn, peas, green beans, salads daily    Beverage(s)  water, coffee with sugar, G2      Exercise   Exercise Type  ADL's      Patient Education   Previous Diabetes Education  No    Disease state   Definition of diabetes, type 1 and 2, and the diagnosis of diabetes;Factors that contribute to the development of diabetes    Nutrition management   Role of diet in the treatment of diabetes and the relationship between the three main macronutrients and blood glucose level;Food label reading, portion sizes and measuring food.;Reviewed blood glucose goals for pre and post meals and how to evaluate the patients' food intake on their blood glucose level.    Physical activity and exercise   Helped patient  identify appropriate exercises in relation to his/her diabetes, diabetes complications and other health issue.    Monitoring  Taught/evaluated SMBG meter.;Purpose and frequency of SMBG.;Taught/discussed recording of test results and interpretation of SMBG.;Identified appropriate SMBG and/or A1C goals.    Chronic complications  Relationship between chronic complications and blood glucose control    Psychosocial adjustment  Identified and addressed patients feelings and concerns about diabetes      Individualized Goals (developed by patient)   Reducing Risk Lose weight     Outcomes   Expected Outcomes  Demonstrated interest in learning. Expect positive outcomes    Future DMSE  4-6 wks       Individualized Plan for Diabetes Self-Management Training:   Learning Objective:  Patient will have a greater understanding of diabetes self-management. Patient education plan is to attend individual and/or group sessions per assessed needs and concerns.   Plan:   Patient Instructions  Check blood sugars 2 x day before breakfast and 2 hrs one meal 3 x week  Bring blood sugar records to the next appointment Call your doctor for a prescription for:  1. Meter strips (type) One Touch Verio  checking  3 times per week  2. Lancets (type) One Touch Delica checking  3     times per week Eat 3 meals day, 1 snack a day Space meals 4-6 hours apart Avoid sugar sweetened drinks (coffee, sports drinks) Complete 3 Day Food Record and bring to next appt Return for appointment on:  Monday November 18, 2017 at 10:30 am with [facility] (dietitian)   Expected Outcomes:  Demonstrated interest in learning. Expect positive outcomes  Education material provided:  General Meal Planning Guidelines Simple Meal Plan Meter = One Touch Verio Flex 3 Day Food Record  If problems or questions, patient to contact team via: Johny Drilling, [place], [place], CDE [phone]  Future DSME appointment: 4-6 wks  November 18, 2017 with the  dietitian. The patient doesn't want to attend Diabetes classes at this time due to multiple medical appointments.

## 2017-11-18 ENCOUNTER — Ambulatory Visit: Payer: Medicare Other | Admitting: Dietician

## 2017-11-18 ENCOUNTER — Other Ambulatory Visit: Payer: Self-pay | Admitting: *Deleted

## 2017-11-18 DIAGNOSIS — T451X5A Adverse effect of antineoplastic and immunosuppressive drugs, initial encounter: Secondary | ICD-10-CM

## 2017-11-18 DIAGNOSIS — C3411 Malignant neoplasm of upper lobe, right bronchus or lung: Secondary | ICD-10-CM

## 2017-11-18 DIAGNOSIS — C3491 Malignant neoplasm of unspecified part of right bronchus or lung: Secondary | ICD-10-CM

## 2017-11-18 DIAGNOSIS — G620 Drug-induced polyneuropathy: Secondary | ICD-10-CM

## 2017-11-18 MED ORDER — HYDROCODONE-ACETAMINOPHEN 5-325 MG PO TABS: 1.0000 | ORAL_TABLET | Freq: Three times a day (TID) | ORAL | 0 refills | Status: DC | PRN

## 2017-11-26 DIAGNOSIS — H353112 Nonexudative age-related macular degeneration, right eye, intermediate dry stage: Secondary | ICD-10-CM | POA: Diagnosis not present

## 2017-11-26 DIAGNOSIS — H353122 Nonexudative age-related macular degeneration, left eye, intermediate dry stage: Secondary | ICD-10-CM | POA: Diagnosis not present

## 2017-11-28 ENCOUNTER — Ambulatory Visit: Payer: Medicare Other | Admitting: Family Medicine

## 2017-11-28 DIAGNOSIS — G250 Essential tremor: Secondary | ICD-10-CM | POA: Diagnosis not present

## 2017-11-28 DIAGNOSIS — R300 Dysuria: Secondary | ICD-10-CM

## 2017-11-28 DIAGNOSIS — E538 Deficiency of other specified B group vitamins: Secondary | ICD-10-CM | POA: Diagnosis not present

## 2017-11-28 LAB — URINALYSIS, COMPLETE
Bilirubin, UA: NEGATIVE
Glucose, UA: NEGATIVE
Ketones, UA: NEGATIVE
Leukocytes, UA: NEGATIVE
Nitrite, UA: NEGATIVE
Protein, UA: NEGATIVE
Specific Gravity, UA: 1.01 (ref 1.005–1.030)
Urobilinogen, Ur: 0.2 mg/dL (ref 0.2–1.0)
pH, UA: 6 (ref 5.0–7.5)

## 2017-11-28 LAB — MICROSCOPIC EXAMINATION
Epithelial Cells (non renal): NONE SEEN /hpf (ref 0–10)
WBC, UA: NONE SEEN /hpf (ref 0–5)

## 2017-11-28 NOTE — Progress Notes (Signed)
Patient presents today with urinary frequency and dysuria.Marland Kitchen His symptoms began about 1 week ago and have been getting worse. Patient states he has not been on any ABX or had any Urological surgeries in the last 30 days. A urine was collected for UA, UCX. Patient mentioned his stream has gotten real week and it takes a long time to get empty. A PVR was performed to ensure complete emptying. The result was 15ML. I took a look at his penis and he has a stricture. I attempted to relieve the stricture with Lidocaine jelly with no success. He will come back to see a provider to alleviate the stricture.

## 2017-12-02 LAB — CULTURE, URINE COMPREHENSIVE

## 2017-12-03 ENCOUNTER — Telehealth: Payer: Self-pay | Admitting: Family Medicine

## 2017-12-03 MED ORDER — AMOXICILLIN 875 MG PO TABS: 875.0000 mg | ORAL_TABLET | Freq: Two times a day (BID) | ORAL | 0 refills | Status: DC

## 2017-12-03 NOTE — Telephone Encounter (Signed)
-----   Message from Abbie Sons, MD sent at 12/03/2017  7:08 AM EDT ----- Urine culture was positive.  Please send Rx amoxicillin 875 mg twice daily x7 days.

## 2017-12-03 NOTE — Telephone Encounter (Signed)
Patient notified and Rx was sent to pharmacy

## 2017-12-06 ENCOUNTER — Telehealth: Payer: Self-pay | Admitting: Dietician

## 2017-12-06 NOTE — Telephone Encounter (Signed)
Called patient to reschedule his missed appointment from 11/18/17. Spoke with patient directly, and he states the appointment is coming up as a "no-show" in [place], and he is disturbed by this as he had cancelled the appointment. He wants it changed to reflect the cancellation before he will reschedule.

## 2017-12-11 ENCOUNTER — Encounter: Payer: Self-pay | Admitting: Radiation Oncology

## 2017-12-11 ENCOUNTER — Other Ambulatory Visit: Payer: Self-pay

## 2017-12-11 ENCOUNTER — Ambulatory Visit
Admission: RE | Admit: 2017-12-11 | Discharge: 2017-12-11 | Disposition: A | Discharge status: Home / Self Care | Payer: Medicare Other | Source: Ambulatory Visit | Attending: Radiation Oncology | Admitting: Radiation Oncology

## 2017-12-11 VITALS — BP 135/53 | HR 72 | Temp 97.0°F | Resp 20 | Wt 210.5 lb

## 2017-12-11 DIAGNOSIS — Z923 Personal history of irradiation: Secondary | ICD-10-CM | POA: Diagnosis not present

## 2017-12-11 DIAGNOSIS — C3411 Malignant neoplasm of upper lobe, right bronchus or lung: Secondary | ICD-10-CM

## 2017-12-11 DIAGNOSIS — Z87891 Personal history of nicotine dependence: Secondary | ICD-10-CM | POA: Insufficient documentation

## 2017-12-11 DIAGNOSIS — Z9981 Dependence on supplemental oxygen: Secondary | ICD-10-CM | POA: Diagnosis not present

## 2017-12-11 NOTE — Progress Notes (Signed)
Radiation Oncology Follow up Note  Name: Joseph Graham   Date:   12/11/2017 MRN:  [id number] DOB: 07/08/1940    This 77 y.o. male presents to the clinic today for 2-1/2 year follow-up status post revision therapy to his right upper lobe for poorly differentiated non-small cell lung cancer in 2 separate foci.  REFERRING PROVIDER: [name], MD  HPI: patient is a 77 year old male now seen out 2-1/2 years having completed radiation therapy to his right upper lobe for poorly differentia non-small cell lung cancer seen 2 separate foci. Seen today in routine follow-up he is doing well he is on chronic nasal oxygen. Specifically denies prominent cough hemoptysis or chest tightness. He's having no bone pain. He recently had a CT scan.back in April showed stable examwith a dominant nodule in the posterior right upper lobe unchanged. Other small nodules are also unchanged.  COMPLICATIONS OF TREATMENT: none  FOLLOW UP COMPLIANCE: keeps appointments   PHYSICAL EXAM:  BP (!) 135/53   Pulse 72   Temp (!) 97 F (36.1 C)   Resp 20   Wt 210 lb 8.6 oz (95.5 kg)   BMI 31.09 kg/m  Well-developed male on nasal oxygen using a walker for assistance.Well-developed well-nourished patient in NAD. HEENT reveals PERLA, EOMI, discs not visualized.  Oral cavity is clear. No oral mucosal lesions are identified. Neck is clear without evidence of cervical or supraclavicular adenopathy. Lungs are clear to A&P. Cardiac examination is essentially unremarkable with regular rate and rhythm without murmur rub or thrill. Abdomen is benign with no organomegaly or masses noted. Motor sensory and DTR levels are equal and symmetric in the upper and lower extremities. Cranial nerves II through XII are grossly intact. Proprioception is intact. No peripheral adenopathy or edema is identified. No motor or sensory levels are noted. Crude visual fields are within normal range.  RADIOLOGY RESULTS: CT scan is reviewed and  compatible with the above-stated findings  PLAN: present time patient is doing well with stable disease by CT criteria. I'm please was overall progress. Continues to have some slight residual neuropathy. We will continue to monitor with CT scans his chest. I have asked to see him back in 1 year for follow-up. Patient knows to call with any concerns.  I would like to take this opportunity to thank you for allowing me to participate in the care of your patient.Noreene Filbert, MD

## 2018-01-03 ENCOUNTER — Encounter: Payer: Self-pay | Admitting: Dietician

## 2018-01-03 NOTE — Progress Notes (Signed)
Sent discharge letter to referring provider, as we are unable to change the status of patient's missed appointment from "no-show" to "cancelled". Patient stated he did not want to reschedule unless the appointment status was changed.

## 2018-01-06 ENCOUNTER — Other Ambulatory Visit: Payer: Self-pay | Admitting: *Deleted

## 2018-01-06 DIAGNOSIS — C3411 Malignant neoplasm of upper lobe, right bronchus or lung: Secondary | ICD-10-CM

## 2018-01-06 DIAGNOSIS — G620 Drug-induced polyneuropathy: Secondary | ICD-10-CM

## 2018-01-06 DIAGNOSIS — C3491 Malignant neoplasm of unspecified part of right bronchus or lung: Secondary | ICD-10-CM

## 2018-01-06 DIAGNOSIS — T451X5A Adverse effect of antineoplastic and immunosuppressive drugs, initial encounter: Secondary | ICD-10-CM

## 2018-01-06 MED ORDER — HYDROCODONE-ACETAMINOPHEN 5-325 MG PO TABS: 1.0000 | ORAL_TABLET | Freq: Three times a day (TID) | ORAL | 0 refills | Status: DC | PRN

## 2018-01-15 ENCOUNTER — Ambulatory Visit: Payer: Medicare Other | Admitting: Family Medicine

## 2018-01-15 ENCOUNTER — Encounter: Payer: Self-pay | Admitting: Family Medicine

## 2018-01-15 VITALS — BP 130/82 | HR 63 | Temp 98.2°F | Ht 69.0 in | Wt 203.8 lb

## 2018-01-15 DIAGNOSIS — R0902 Hypoxemia: Secondary | ICD-10-CM | POA: Diagnosis not present

## 2018-01-15 DIAGNOSIS — G620 Drug-induced polyneuropathy: Secondary | ICD-10-CM | POA: Diagnosis not present

## 2018-01-15 DIAGNOSIS — J449 Chronic obstructive pulmonary disease, unspecified: Secondary | ICD-10-CM

## 2018-01-15 DIAGNOSIS — C3411 Malignant neoplasm of upper lobe, right bronchus or lung: Secondary | ICD-10-CM

## 2018-01-15 DIAGNOSIS — T451X5A Adverse effect of antineoplastic and immunosuppressive drugs, initial encounter: Secondary | ICD-10-CM

## 2018-01-15 DIAGNOSIS — E1151 Type 2 diabetes mellitus with diabetic peripheral angiopathy without gangrene: Secondary | ICD-10-CM

## 2018-01-15 LAB — POCT GLYCOSYLATED HEMOGLOBIN (HGB A1C): HEMOGLOBIN A1C: 6.1 % — AB (ref 4.0–5.6)

## 2018-01-15 MED ORDER — ALBUTEROL SULFATE (2.5 MG/3ML) 0.083% IN NEBU
2.5000 mg | INHALATION_SOLUTION | Freq: Once | RESPIRATORY_TRACT | Status: AC
  Administered 2018-01-15: 2.5 mg via RESPIRATORY_TRACT

## 2018-01-15 NOTE — Assessment & Plan Note (Signed)
Stable on oxygen. Will try to update pre/post albuterol spirometry today - if unable to (techinal difficulties), will ask him to return when working.

## 2018-01-15 NOTE — Progress Notes (Addendum)
BP 130/82 (BP Location: Left Arm, Patient Position: Sitting, Cuff Size: Normal)   Pulse 63   Temp 98.2 F (36.8 C) (Oral)   Ht 5\' 9"  (1.753 m)   Wt 203 lb 12 oz (92.4 kg)   SpO2 98% Comment: 2 L, pulsating  BMI 30.09 kg/m    CC: 4 mo f/u visit Subjective:    Patient ID: Joseph Graham, male    DOB: 08-20-1940, 77 y.o.   MRN: [id number]  HPI: Joseph Graham is a 77 y.o. male presenting on 01/15/2018 for No chief complaint on file.   We started supplemental oxygen earlier this year due to presumed COPD with hypoxia in setting of lung cancer. This has improved activity level. Due for spirometry.   periph neuropathy - on gabapentin, sees [facility] neurology. Primidone helped essential tremor. Feels legs are getting stronger - able to get up off chair more easily.   DM - does not regularly check sugars - but has new sugar meter. Compliant with antihyperglycemic regimen which includes: diet controlled. Denies low sugars or hypoglycemic symptoms. Denies paresthesias. Last diabetic eye exam 09/2017. Pneumovax: 2012. Prevnar: 2015. Glucometer brand: one-touch verio. DSME: has seen 2019. They gave him one -touch meter. Upcoming appt with podiatrist next week.  Lab Results  Component Value Date   HGBA1C 6.1 (A) 01/15/2018   Diabetic Foot Exam - Simple   Simple Foot Form Diabetic Foot exam was performed with the following findings:  Yes 01/15/2018 10:29 AM  Visual Inspection See comments:  Yes Sensation Testing Intact to touch and monofilament testing bilaterally:  Yes See comments:  Yes Pulse Check Posterior Tibialis and Dorsalis pulse intact bilaterally:  Yes Comments Thickened nails Diminished to monofilament testing    No results found for: MICROALBUR, [id number]   Known lung cancer followed by onc - last seen last week. S/p radiation and adjuvant chemo with partial response.   Bladder cancer sees uro Q74mo cystoscopy Joseph Graham).   Relevant past medical, surgical, family and  social history reviewed and updated as indicated. Interim medical history since our last visit reviewed. Allergies and medications reviewed and updated. Outpatient Medications Prior to Visit  Medication Sig Dispense Refill  . Cyanocobalamin (B-12 PO) Take 1 tablet by mouth daily.    Marland Kitchen gabapentin (NEURONTIN) 300 MG capsule Take 1 capsule (300 mg total) by mouth 3 (three) times daily. (Patient taking differently: Take 300 mg by mouth 3 (three) times daily. 1 tablet in am,  1 tablet at lunch, and 2 tablets at bedtime (600 mg)) 90 capsule 3  . HYDROcodone-acetaminophen (NORCO) 5-325 MG tablet Take 1 tablet by mouth every 8 (eight) hours as needed for moderate pain. 90 tablet 0  . lisinopril (PRINIVIL,ZESTRIL) 10 MG tablet Take 1 tablet (10 mg total) by mouth every evening. 90 tablet 3  . Magnesium Cl-Calcium Carbonate (SLOW-MAG PO) Take 1 tablet by mouth daily.    . metoprolol tartrate (LOPRESSOR) 25 MG tablet Take 0.5 tablets (12.5 mg total) by mouth 2 (two) times daily. 90 tablet 3  . Multiple Vitamin (MULTI-VITAMINS) TABS Take 1 tablet by mouth daily.     . primidone (MYSOLINE) 50 MG tablet Take 50 mg by mouth 2 (two) times daily. Takes 2 tablets in the morning and 2 tablets in the evening  1  . tiotropium (SPIRIVA HANDIHALER) 18 MCG inhalation capsule Place 1 capsule (18 mcg total) into inhaler and inhale at bedtime. (Patient taking differently: Place 18 mcg into inhaler and inhale daily. Takes every morning)  30 capsule 12  . VITAMIN E PO Take 1 tablet by mouth daily.    Marland Kitchen omeprazole (PRILOSEC) 20 MG capsule Take by mouth.     No facility-administered medications prior to visit.      Per HPI unless specifically indicated in ROS section below Review of Systems     Objective:    BP 130/82 (BP Location: Left Arm, Patient Position: Sitting, Cuff Size: Normal)   Pulse 63   Temp 98.2 F (36.8 C) (Oral)   Ht 5\' 9"  (1.753 m)   Wt 203 lb 12 oz (92.4 kg)   SpO2 98% Comment: 2 L, pulsating  BMI  30.09 kg/m   Wt Readings from Last 3 Encounters:  01/15/18 203 lb 12 oz (92.4 kg)  12/11/17 210 lb 8.6 oz (95.5 kg)  10/24/17 214 lb 3.2 oz (97.2 kg)    Physical Exam  Constitutional: He appears well-developed and well-nourished. No distress.  HENT:  Head: Normocephalic and atraumatic.  Right Ear: External ear normal.  Left Ear: External ear normal.  Nose: Nose normal.  Mouth/Throat: Oropharynx is clear and moist. No oropharyngeal exudate.  Eyes: Pupils are equal, round, and reactive to light. Conjunctivae and EOM are normal. No scleral icterus.  Neck: Normal range of motion. Neck supple.  Cardiovascular: Normal rate, regular rhythm, normal heart sounds and intact distal pulses.  No murmur heard. Pulmonary/Chest: Effort normal and breath sounds normal. No respiratory distress. He has no wheezes. He has no rales.  Musculoskeletal: He exhibits no edema.  See HPI for foot exam if done  Lymphadenopathy:    He has no cervical adenopathy.  Skin: Skin is warm and dry. No rash noted.  Psychiatric: He has a normal mood and affect.  Nursing note and vitals reviewed.  Spirometry: Technical difficulties - pre test was erased. pt will not be charged. post: FVC 56%, FEV 37%, ratio 0.66    Assessment & Plan:   Problem List Items Addressed This Visit    Peripheral neuropathy due to chemotherapy [facility])    Has noted some improvement over last several months.       Malignant neoplasm of right upper lobe of lung ([place])    Regularly sees onc.      Diabetes mellitus type 2 with peripheral artery disease (HCC) - Primary    Chronic, stable. Significant improvement on last check, continue current diet control. Did attend refresher course.  Check A1c today.  Will request eye exam report today.       Relevant Orders   POCT glycosylated hemoglobin (Hb A1C) (Completed)   COPD with hypoxia (HCC)    Stable on oxygen. Will try to update pre/post albuterol spirometry today - if unable to (techinal  difficulties), will ask him to return when working.       Relevant Orders   PR EVAL OF BRONCHOSPASM       No orders of the defined types were placed in this encounter.  Orders Placed This Encounter  Procedures  . POCT glycosylated hemoglobin (Hb A1C)  . PR EVAL OF BRONCHOSPASM    Follow up plan: Return in about 5 months (around 06/17/2018).  [name], MD

## 2018-01-15 NOTE — Assessment & Plan Note (Signed)
Regularly sees onc.

## 2018-01-15 NOTE — Assessment & Plan Note (Addendum)
Chronic, stable. Significant improvement on last check, continue current diet control. Did attend refresher course.  Check A1c today.  Will request eye exam report today.

## 2018-01-15 NOTE — Assessment & Plan Note (Signed)
Has noted some improvement over last several months.

## 2018-01-15 NOTE — Patient Instructions (Addendum)
A1c today.  Sign release form up front for eye exam report.  Return in 5 months for medicare wellness visit and physical. Spirometry today.

## 2018-01-20 DIAGNOSIS — G620 Drug-induced polyneuropathy: Secondary | ICD-10-CM | POA: Diagnosis not present

## 2018-01-20 DIAGNOSIS — B351 Tinea unguium: Secondary | ICD-10-CM | POA: Diagnosis not present

## 2018-01-20 DIAGNOSIS — E119 Type 2 diabetes mellitus without complications: Secondary | ICD-10-CM | POA: Diagnosis not present

## 2018-01-20 DIAGNOSIS — T451X5A Adverse effect of antineoplastic and immunosuppressive drugs, initial encounter: Secondary | ICD-10-CM | POA: Diagnosis not present

## 2018-02-18 ENCOUNTER — Other Ambulatory Visit: Payer: Self-pay | Admitting: *Deleted

## 2018-02-18 DIAGNOSIS — C3411 Malignant neoplasm of upper lobe, right bronchus or lung: Secondary | ICD-10-CM

## 2018-02-18 DIAGNOSIS — C3491 Malignant neoplasm of unspecified part of right bronchus or lung: Secondary | ICD-10-CM

## 2018-02-18 DIAGNOSIS — T451X5A Adverse effect of antineoplastic and immunosuppressive drugs, initial encounter: Secondary | ICD-10-CM

## 2018-02-18 DIAGNOSIS — G620 Drug-induced polyneuropathy: Secondary | ICD-10-CM

## 2018-02-18 MED ORDER — HYDROCODONE-ACETAMINOPHEN 5-325 MG PO TABS: 1.0000 | ORAL_TABLET | Freq: Three times a day (TID) | ORAL | 0 refills | Status: DC | PRN

## 2018-02-26 DIAGNOSIS — H353122 Nonexudative age-related macular degeneration, left eye, intermediate dry stage: Secondary | ICD-10-CM | POA: Diagnosis not present

## 2018-03-05 ENCOUNTER — Ambulatory Visit (INDEPENDENT_AMBULATORY_CARE_PROVIDER_SITE_OTHER): Payer: Medicare Other | Admitting: Urology

## 2018-03-05 ENCOUNTER — Encounter: Payer: Self-pay | Admitting: Urology

## 2018-03-05 VITALS — BP 118/52 | HR 78 | Ht 69.0 in | Wt 207.9 lb

## 2018-03-05 DIAGNOSIS — C679 Malignant neoplasm of bladder, unspecified: Secondary | ICD-10-CM

## 2018-03-05 MED ORDER — LIDOCAINE HCL URETHRAL/MUCOSAL 2 % EX GEL
1.0000 "application " | Freq: Once | CUTANEOUS | Status: AC
  Administered 2018-03-05: 1 via URETHRAL

## 2018-03-05 NOTE — Progress Notes (Signed)
Bladder cancer surveillance note  UROLOGIC HISTORY  Joseph Graham is a 77 y.o. male with history of TURBT in October 2016 for high-grade 3cm pTa urothelial cell cancer.  Muscle was not present in the specimen.  He underwent re-resection in February 2017 which showed no residual tumor.  He has not undergone intravesical therapy secondary to undergoing Taxol based chemotherapy for a primary lung cancer.  He was previously followed by Dr. [name].  Initial Diagnosis of Bladder  Year: 2016 Pathology: HG Ta  Recurrent Bladder Cancer Diagnosis  Date  Pathology None  Treatments for Bladder Cancer TURBT 03/2015-> HG Ta Re-resection 07/2015-> Negative  AUA Risk Category High risk  Cystoscopy Procedure Note:  After informed consent and discussion of the procedure and its risks, Joseph Graham was positioned and prepped in the standard fashion. Cystoscopy was performed with the a flexible cystoscope. The urethra, bladder neck and entire bladder was visualized in a standard fashion, and bladder mucosa was grossly normal throughout. The ureteral orifices were visualized in their normal location and orientation. Cytology sent.  Normal cystoscopy  Plan: Follow up cytology  Cysto in 6 months Upper tract imaging with CT urogram, call with results  Nickolas Madrid, MD 03/05/2018

## 2018-03-06 LAB — URINALYSIS, COMPLETE
BILIRUBIN UA: NEGATIVE
Glucose, UA: NEGATIVE
KETONES UA: NEGATIVE
LEUKOCYTES UA: NEGATIVE
Nitrite, UA: NEGATIVE
PROTEIN UA: NEGATIVE
SPEC GRAV UA: 1.015 (ref 1.005–1.030)
Urobilinogen, Ur: 0.2 mg/dL (ref 0.2–1.0)
pH, UA: 5.5 (ref 5.0–7.5)

## 2018-03-06 LAB — MICROSCOPIC EXAMINATION
Bacteria, UA: NONE SEEN
Epithelial Cells (non renal): NONE SEEN /hpf (ref 0–10)
WBC, UA: NONE SEEN /hpf (ref 0–5)

## 2018-03-07 ENCOUNTER — Other Ambulatory Visit: Payer: Medicare Other

## 2018-03-19 ENCOUNTER — Ambulatory Visit
Admission: RE | Admit: 2018-03-19 | Discharge: 2018-03-19 | Disposition: A | Discharge status: Home / Self Care | Payer: Medicare Other | Source: Ambulatory Visit | Attending: Internal Medicine | Admitting: Internal Medicine

## 2018-03-19 DIAGNOSIS — C3490 Malignant neoplasm of unspecified part of unspecified bronchus or lung: Secondary | ICD-10-CM | POA: Diagnosis not present

## 2018-03-19 DIAGNOSIS — I2510 Atherosclerotic heart disease of native coronary artery without angina pectoris: Secondary | ICD-10-CM | POA: Insufficient documentation

## 2018-03-19 DIAGNOSIS — I700 Atherosclerosis of aorta: Secondary | ICD-10-CM | POA: Insufficient documentation

## 2018-03-19 DIAGNOSIS — J439 Emphysema, unspecified: Secondary | ICD-10-CM | POA: Diagnosis not present

## 2018-03-19 DIAGNOSIS — C3411 Malignant neoplasm of upper lobe, right bronchus or lung: Secondary | ICD-10-CM | POA: Insufficient documentation

## 2018-03-19 LAB — POCT I-STAT CREATININE: CREATININE: 0.7 mg/dL (ref 0.61–1.24)

## 2018-03-19 MED ORDER — IOPAMIDOL (ISOVUE-300) INJECTION 61%
75.0000 mL | Freq: Once | INTRAVENOUS | Status: AC | PRN
  Administered 2018-03-19: 75 mL via INTRAVENOUS

## 2018-03-20 ENCOUNTER — Other Ambulatory Visit: Payer: Self-pay | Admitting: *Deleted

## 2018-03-20 DIAGNOSIS — C3411 Malignant neoplasm of upper lobe, right bronchus or lung: Secondary | ICD-10-CM

## 2018-03-21 ENCOUNTER — Inpatient Hospital Stay: Payer: Medicare Other | Admitting: Internal Medicine

## 2018-03-21 ENCOUNTER — Inpatient Hospital Stay: Payer: Medicare Other | Attending: Internal Medicine

## 2018-03-21 ENCOUNTER — Encounter: Payer: Self-pay | Admitting: Internal Medicine

## 2018-03-21 VITALS — BP 146/61 | HR 60 | Temp 97.3°F | Resp 16 | Wt 208.0 lb

## 2018-03-21 DIAGNOSIS — I2510 Atherosclerotic heart disease of native coronary artery without angina pectoris: Secondary | ICD-10-CM

## 2018-03-21 DIAGNOSIS — C3411 Malignant neoplasm of upper lobe, right bronchus or lung: Secondary | ICD-10-CM | POA: Insufficient documentation

## 2018-03-21 DIAGNOSIS — N400 Enlarged prostate without lower urinary tract symptoms: Secondary | ICD-10-CM | POA: Diagnosis not present

## 2018-03-21 DIAGNOSIS — E785 Hyperlipidemia, unspecified: Secondary | ICD-10-CM

## 2018-03-21 DIAGNOSIS — F1011 Alcohol abuse, in remission: Secondary | ICD-10-CM | POA: Insufficient documentation

## 2018-03-21 DIAGNOSIS — Z923 Personal history of irradiation: Secondary | ICD-10-CM | POA: Insufficient documentation

## 2018-03-21 DIAGNOSIS — M7989 Other specified soft tissue disorders: Secondary | ICD-10-CM | POA: Diagnosis not present

## 2018-03-21 DIAGNOSIS — G250 Essential tremor: Secondary | ICD-10-CM

## 2018-03-21 DIAGNOSIS — Z87891 Personal history of nicotine dependence: Secondary | ICD-10-CM | POA: Insufficient documentation

## 2018-03-21 DIAGNOSIS — E119 Type 2 diabetes mellitus without complications: Secondary | ICD-10-CM

## 2018-03-21 DIAGNOSIS — G629 Polyneuropathy, unspecified: Secondary | ICD-10-CM | POA: Diagnosis not present

## 2018-03-21 DIAGNOSIS — G893 Neoplasm related pain (acute) (chronic): Secondary | ICD-10-CM | POA: Insufficient documentation

## 2018-03-21 DIAGNOSIS — R918 Other nonspecific abnormal finding of lung field: Secondary | ICD-10-CM | POA: Insufficient documentation

## 2018-03-21 DIAGNOSIS — M545 Low back pain: Secondary | ICD-10-CM

## 2018-03-21 DIAGNOSIS — Z808 Family history of malignant neoplasm of other organs or systems: Secondary | ICD-10-CM | POA: Diagnosis not present

## 2018-03-21 DIAGNOSIS — J449 Chronic obstructive pulmonary disease, unspecified: Secondary | ICD-10-CM | POA: Diagnosis not present

## 2018-03-21 DIAGNOSIS — G8929 Other chronic pain: Secondary | ICD-10-CM | POA: Diagnosis not present

## 2018-03-21 DIAGNOSIS — K219 Gastro-esophageal reflux disease without esophagitis: Secondary | ICD-10-CM

## 2018-03-21 DIAGNOSIS — Z79899 Other long term (current) drug therapy: Secondary | ICD-10-CM | POA: Diagnosis not present

## 2018-03-21 DIAGNOSIS — Z9221 Personal history of antineoplastic chemotherapy: Secondary | ICD-10-CM | POA: Diagnosis not present

## 2018-03-21 DIAGNOSIS — I10 Essential (primary) hypertension: Secondary | ICD-10-CM | POA: Diagnosis not present

## 2018-03-21 DIAGNOSIS — C679 Malignant neoplasm of bladder, unspecified: Secondary | ICD-10-CM

## 2018-03-21 DIAGNOSIS — R635 Abnormal weight gain: Secondary | ICD-10-CM

## 2018-03-21 DIAGNOSIS — Z8052 Family history of malignant neoplasm of bladder: Secondary | ICD-10-CM | POA: Diagnosis not present

## 2018-03-21 LAB — COMPREHENSIVE METABOLIC PANEL
ALBUMIN: 4 g/dL (ref 3.5–5.0)
ALK PHOS: 79 U/L (ref 38–126)
ALT: 22 U/L (ref 0–44)
AST: 25 U/L (ref 15–41)
Anion gap: 7 (ref 5–15)
BUN: 21 mg/dL (ref 8–23)
CHLORIDE: 103 mmol/L (ref 98–111)
CO2: 31 mmol/L (ref 22–32)
CREATININE: 0.79 mg/dL (ref 0.61–1.24)
Calcium: 9.1 mg/dL (ref 8.9–10.3)
GFR calc non Af Amer: >60 mL/min (ref >60)
GLUCOSE: 139 mg/dL — AB (ref 70–99)
Potassium: 4.3 mmol/L (ref 3.5–5.1)
SODIUM: 141 mmol/L (ref 135–145)
Total Bilirubin: 0.5 mg/dL (ref 0.3–1.2)
Total Protein: 7.9 g/dL (ref 6.5–8.1)

## 2018-03-21 LAB — CBC WITH DIFFERENTIAL/PLATELET
Abs Immature Granulocytes: 0.01 10*3/uL (ref 0.00–0.07)
BASOS ABS: 0 10*3/uL (ref 0.0–0.1)
Basophils Relative: 1 %
EOS PCT: 3 %
Eosinophils Absolute: 0.2 10*3/uL (ref 0.0–0.5)
HEMATOCRIT: 38.9 % — AB (ref 39.0–52.0)
HEMOGLOBIN: 12.2 g/dL — AB (ref 13.0–17.0)
Immature Granulocytes: 0 %
LYMPHS ABS: 1.3 10*3/uL (ref 0.7–4.0)
LYMPHS PCT: 25 %
MCH: 31.1 pg (ref 26.0–34.0)
MCHC: 31.4 g/dL (ref 30.0–36.0)
MCV: 99.2 fL (ref 80.0–100.0)
MONOS PCT: 10 %
Monocytes Absolute: 0.5 10*3/uL (ref 0.1–1.0)
NRBC: 0 % (ref 0.0–0.2)
Neutro Abs: 3.3 10*3/uL (ref 1.7–7.7)
Neutrophils Relative %: 61 %
Platelets: 229 10*3/uL (ref 150–400)
RBC: 3.92 MIL/uL — ABNORMAL LOW (ref 4.22–5.81)
RDW: 13.9 % (ref 11.5–15.5)
WBC: 5.3 10*3/uL (ref 4.0–10.5)

## 2018-03-21 NOTE — Progress Notes (Signed)
[place] NOTE  Patient Care Team: [name], MD as PCP - General (Family Medicine) Cammie Sickle, MD as Consulting Physician (Internal Medicine) [name], MD as Consulting Physician (Urology) Vladimir Crofts, MD as Consulting Physician (Neurology) Minna Merritts, MD as Consulting Physician (Cardiology) Noreene Filbert, MD as Referring Physician (Radiation Oncology) [name], MD as Referring Physician (Cardiothoracic Surgery)  CHIEF COMPLAINTS/PURPOSE OF CONSULTATION:   Oncology History    # OCT 2016- RUL POORLY DIFF CA [clinically non-small cell] ; STAGE IIB [T3-2 separate nodules in RUL; N0] [s/p CT guided bx]- s/p RT [finished mid jan 2017]; FEB 2017-CT scan- regression of RUL nodules;   # March 2017- Start carbo-taxol q 3 w x4; June 2017- CT- Improvement of RUL nodule/ Stable RUL nodules. OCT 2017- PR   # OCT 2016- 2 sub cm nodules in Right LL- Feb CT 2017- STABLE; June 2017- resolved  # May- 2017- G2 PN  # OCT 2016-NON-invasive bladder urothelial cancer; high grade [Dr. [name]; s/p TURBT [Feb 2017- NEG]; declined BCG  #   # Pneumothorax [oct 2016 s/p CT Bx];  # MOLECULAR STUDIES- PDL-1 by IHC/keytruda- 11-20%.  -----------------------------------------    DIAGNOSIS: RUL Lung cancer  STAGE:   II ; GOALS: control  CURRENT/MOST RECENT THERAPY: Surveillance        Malignant neoplasm of right upper lobe of lung ([place])   12/20/2015 Initial Diagnosis    Malignant neoplasm of right upper lobe of lung ([facility])    HISTORY OF PRESENTING ILLNESS:  [facility] 77 y.o. male with above history of stage II T3 N0 right upper lobe non-small cell lung cancer-status post radiation followed by adjuvant chemotherapy is here to review the results of his CT scan.  Patient continues to have tingling and numbness of his activities along with pain.  For which he takes hydrocodone up to 2 to 3 pills a day.  He continues his  compression stockings.  No worsening swelling in the legs.  Chronic shortness of breath on O2.  Not any worse..  Review of Systems  Constitutional: Negative for chills, diaphoresis, fever, malaise/fatigue and weight loss.  HENT: Negative for nosebleeds and sore throat.   Eyes: Negative for double vision.  Respiratory: Positive for shortness of breath. Negative for cough, hemoptysis, sputum production and wheezing.   Cardiovascular: Positive for leg swelling. Negative for chest pain, palpitations and orthopnea.  Gastrointestinal: Negative for abdominal pain, blood in stool, constipation, diarrhea, heartburn, melena, nausea and vomiting.  Genitourinary: Negative for dysuria, frequency and urgency.  Musculoskeletal: Negative for back pain and joint pain.  Skin: Negative.  Negative for itching and rash.  Neurological: Positive for tingling. Negative for dizziness, focal weakness, weakness and headaches.  Endo/Heme/Allergies: Does not bruise/bleed easily.  Psychiatric/Behavioral: Negative for depression. The patient is not nervous/anxious and does not have insomnia.      MEDICAL HISTORY:  Past Medical History:  Diagnosis Date  . Alcohol abuse, in remission    remote   . Arthritis    BACK AND NECK  . Benign essential tremor    improved on metoprolol and gabapentin  . BPH (benign prostatic hypertrophy) 12/30/2012  . Cardiac arrhythmia 03/2010   h/o a flutter and CM, s/p ablation, normal stress test  . Cataract    bilat removed   . Chemotherapy-induced neutropenia ([place]) 09/23/2015  . Chronic low back pain    MRI 2004, multilevel DDD  . Colon polyps    next colonoscopy due around  8891  . Complication of anesthesia    DID NOT GET COMPLETELY NUMB WITH TONSILLECTOMY  . COPD (chronic obstructive pulmonary disease) ([place])   . Coronary artery disease   . Diabetes mellitus without complication ([place])   . Dyslipidemia    mild off meds  . GERD (gastroesophageal reflux disease)   . HTN  (hypertension)   . Multiple pulmonary nodules 02/2015  . Neuromuscular disorder ([place])    neuropathy from chemo....in feet  . Peripheral neuropathy due to chemotherapy ([place]) 12/23/2015   Saw Dr Manuella Ghazi, on gabapentin and cymbalta with hydrocodone PRN (03/2016)  . Personal history of tobacco use, presenting hazards to health 02/18/2015   quit 06/2011, restarted 2014  . Pneumothorax after biopsy 04/01/2015  . Primary cancer of bladder ([place]) 2016   [place]  . Primary lung cancer ([employer]) 2016   port a cath removed 03/2016  . Shortness of breath dyspnea    OCC WITH EXERTION    SURGICAL HISTORY: Past Surgical History:  Procedure Laterality Date  . A FLUTTER ABLATION  03/2010  . carotid US  03/2010   WNL  . CATARACT EXTRACTION    . COLONOSCOPY  09/2011   polyps, rpt due 5 yrs, mild diverticulosis [name])  . CYSTOSCOPY W/ RETROGRADES Bilateral 04/06/2015   Procedure: CYSTOSCOPY WITH RETROGRADE PYELOGRAM;  Surgeon: [name], MD;  Location: [facility];  Service: Urology;  Laterality: Bilateral;  . ELECTROMAGNETIC NAVIGATION BROCHOSCOPY N/A 03/02/2015   Procedure: ELECTROMAGNETIC NAVIGATION BRONCHOSCOPY;  Surgeon: [name], MD;  Location: [facility];  Service: Cardiopulmonary;  Laterality: N/A;  . [place]  . POLYPECTOMY    . PORT-A-CATH REMOVAL Right 04/03/2016   Procedure: REMOVAL PORT-A-CATH;  Surgeon: [name], MD;  Location: [facility];  Service: General;  Laterality: Right;  . PORTACATH PLACEMENT N/A 08/10/2015   Procedure: INSERTION PORT-A-CATH;  Surgeon: [name], MD;  Location: [facility];  Service: General;  Laterality: N/A;  . [place]  . TRANSURETHRAL RESECTION OF BLADDER TUMOR N/A 04/06/2015   Procedure: TRANSURETHRAL RESECTION OF BLADDER TUMOR (TURBT) right ureteral stent placement;  Surgeon: [name], MD;  Location: [facility];  Service: Urology;  Laterality: N/A;  . TRANSURETHRAL RESECTION OF BLADDER TUMOR N/A 07/13/2015   Procedure:  TRANSURETHRAL RESECTION OF BLADDER TUMOR (TURBT);  Surgeon: [name], MD;  Location: [facility];  Service: Urology;  Laterality: N/A;  . urinary stent removal  04/14/15    SOCIAL HISTORY: Patient is retired from Press [employer]. He lives in [place]. Social History   Socioeconomic History  . Marital status: Married    Spouse name: Not on file  . Number of children: Not on file  . Years of education: Not on file  . Highest education level: Not on file  Occupational History  . Not on file  Social Needs  . Financial resource strain: Not on file  . Food insecurity:    Worry: Not on file    Inability: Not on file  . Transportation needs:    Medical: Not on file    Non-medical: Not on file  Tobacco Use  . Smoking status: Former Smoker    Packs/day: 1.50    Years: 61.00    Pack years: 91.50    Types: Cigarettes    Last attempt to quit: 03/08/2015    Years since quitting: 3.0  . Smokeless tobacco: Never Used  . Tobacco comment: cut back 0.5 cigarettes--stopped 03/07/15; now now chantix  Substance and Sexual Activity  . Alcohol use:  No    Alcohol/week: 0.0 standard drinks    Comment: has not drank in 37 years  . Drug use: No  . Sexual activity: Never  Lifestyle  . Physical activity:    Days per week: Not on file    Minutes per session: Not on file  . Stress: Not on file  Relationships  . Social connections:    Talks on phone: Not on file    Gets together: Not on file    Attends religious service: Not on file    Active member of club or organization: Not on file    Attends meetings of clubs or organizations: Not on file    Relationship status: Not on file  . Intimate partner violence:    Fear of current or ex partner: Not on file    Emotionally abused: Not on file    Physically abused: Not on file    Forced sexual activity: Not on file  Other Topics Concern  . Not on file  Social History Narrative   Caffeine: 1 cup decaf/day   Lives with wife   Occupation: retired,  worked Press photographer   Activity: gym 3x/wk, Immunologist, hobbies (paint, building things)   Diet: healthy, good water, fruits/vegetables daily, red meat 1x/wk    FAMILY HISTORY: Family History  Problem Relation Age of Onset  . Cervical cancer Mother 97  . Hypertension Father   . Tremor Father        and several uncles/aunts (not parkinson's)  . Stroke Brother 66  . Bladder Cancer Brother 17  . Prostate cancer Neg Hx   . Kidney cancer Neg Hx   . Colon cancer Neg Hx   . Colon polyps Neg Hx   . Esophageal cancer Neg Hx   . Rectal cancer Neg Hx     ALLERGIES:  has No Known Allergies.  MEDICATIONS:  Current Outpatient Medications  Medication Sig Dispense Refill  . Cyanocobalamin (B-12 PO) Take 1 tablet by mouth daily.    Marland Kitchen gabapentin (NEURONTIN) 300 MG capsule Take 1 capsule (300 mg total) by mouth 3 (three) times daily. (Patient taking differently: Take 300 mg by mouth 3 (three) times daily. 1 tablet in am,  1 tablet at lunch, and 2 tablets at bedtime (600 mg)) 90 capsule 3  . HYDROcodone-acetaminophen (NORCO) 5-325 MG tablet Take 1 tablet by mouth every 8 (eight) hours as needed for moderate pain. 90 tablet 0  . lisinopril (PRINIVIL,ZESTRIL) 10 MG tablet Take 1 tablet (10 mg total) by mouth every evening. 90 tablet 3  . Magnesium Cl-Calcium Carbonate (SLOW-MAG PO) Take 1 tablet by mouth daily.    . metoprolol tartrate (LOPRESSOR) 25 MG tablet Take 0.5 tablets (12.5 mg total) by mouth 2 (two) times daily. 90 tablet 3  . Multiple Vitamin (MULTI-VITAMINS) TABS Take 1 tablet by mouth daily.     . primidone (MYSOLINE) 50 MG tablet Take 50 mg by mouth 2 (two) times daily. Takes 2 tablets in the morning and 2 tablets in the evening  1  . VITAMIN E PO Take 1 tablet by mouth daily.    Marland Kitchen tiotropium (SPIRIVA HANDIHALER) 18 MCG inhalation capsule Place 1 capsule (18 mcg total) into inhaler and inhale at bedtime. (Patient not taking: Reported on 03/21/2018) 30 capsule 12   No current  facility-administered medications for this visit.   Marland Kitchen  PHYSICAL EXAMINATION: ECOG PERFORMANCE STATUS: 0 - Asymptomatic  Vitals:   03/21/18 1035 03/21/18 1043  BP: (!) 160/57 (!) 146/61  Pulse: 64 60  Resp: 16   Temp: (!) 97.3 F (36.3 C)    Filed Weights   03/21/18 1035 03/21/18 1040  Weight: 203 lb (92.1 kg) 208 lb (94.3 kg)  Pulse ox 94% on room air.  Physical Exam  Constitutional: He is oriented to person, place, and time and well-developed, well-nourished, and in no distress.  Patient is on nasal cannula walking with a rolling walker.  Accompanied by his wife.  HENT:  Head: Normocephalic and atraumatic.  Mouth/Throat: Oropharynx is clear and moist. No oropharyngeal exudate.  Eyes: Pupils are equal, round, and reactive to light.  Neck: Normal range of motion. Neck supple.  Cardiovascular: Normal rate and regular rhythm.  Pulmonary/Chest: No respiratory distress. He has no wheezes.  Decreased air entry bilaterally.  Abdominal: Soft. Bowel sounds are normal. He exhibits no distension and no mass. There is no tenderness. There is no rebound and no guarding.  Musculoskeletal: Normal range of motion. He exhibits no edema or tenderness.  Neurological: He is alert and oriented to person, place, and time.  Skin: Skin is warm.  Psychiatric: Affect normal.    LABORATORY DATA:  I have reviewed the data as listed Lab Results  Component Value Date   WBC 5.3 03/21/2018   HGB 12.2 (L) 03/21/2018   HCT 38.9 (L) 03/21/2018   MCV 99.2 03/21/2018   PLT 229 03/21/2018   Recent Labs    03/25/17 1445 07/01/17 0915  09/25/17 1110 03/19/18 1004 03/21/18 1013  NA 137 138  --  137  --  141  K 4.6 4.6  --  4.4  --  4.3  CL 97* 95*  --  100*  --  103  CO2 30 39*  --  29  --  31  GLUCOSE 111* 145*  --  127*  --  139*  BUN 16 19  --  17  --  21  CREATININE 0.80 0.67   < > 0.71 0.70 0.79  CALCIUM 8.9 9.0  --  8.9  --  9.1  GFRNONAA >60  --   --  >60  --  >60  GFRAA >60  --   --   >60  --  >60  PROT 8.2* 7.4  --  7.8  --  7.9  ALBUMIN 4.0 4.0  --  3.8  --  4.0  AST 55* 33  --  31  --  25  ALT 63 43  --  29  --  22  ALKPHOS 105 110  --  92  --  79  BILITOT 0.5 0.5  --  0.5  --  0.5   < > = values in this interval not displayed.    RADIOGRAPHIC STUDIES:  ASSESSMENT & PLAN:   Malignant neoplasm of right upper lobe of lung ([place]) # Right UL POORLY DIFF CA- T3 N0  Currently status post radiation [finished feb 2017];  Finished adjuvant chemotherapy [end of April 2017] October 2019-  CT scan shows continued partial response; STABLE- RUL/ RLL- stable. RML- appx 17m [see discussion below]  # RML/RLL-slight growth over 2-3 years; stable from 6 months ago;  too small to biopsy.  Recommend monitoring for now; recommend SBRT/ if it continues to grow. STABLE.   # Peripheral neuropathy-STABLE; continue- Hydrocodone q 12 prn. Continue Neurontin 300 BID [follows up with neurology, Dr.Shah.].   # COPD on O2/nasal cannula. STABLE.   # Bil LE swelling/weight gain; not worse-?PN- swelling improved on mild compression stockings.  #  DISPOSITION:  # Follow up in 6 months/Ct scan prior/cbc/cmp- Dr.B  # I reviewed the blood work- with the patient in detail; also reviewed the imaging independently [as summarized above]; and with the patient in detail.      Cammie Sickle, MD 03/21/2018 1:09 PM

## 2018-03-21 NOTE — Assessment & Plan Note (Addendum)
#   Right UL POORLY DIFF CA- T3 N0  Currently status post radiation [finished feb 2017];  Finished adjuvant chemotherapy [end of April 2017] October 2019-  CT scan shows continued partial response; STABLE- RUL/ RLL- stable. RML- appx 51mm [see discussion below]  # RML/RLL-slight growth over 2-3 years; stable from 6 months ago;  too small to biopsy.  Recommend monitoring for now; recommend SBRT/ if it continues to grow. STABLE.   # Peripheral neuropathy-STABLE; continue- Hydrocodone q 12 prn. Continue Neurontin 300 BID [follows up with neurology, Dr.Shah.].   # COPD on O2/nasal cannula. STABLE.   # Bil LE swelling/weight gain; not worse-?PN- swelling improved on mild compression stockings.  # DISPOSITION:  # Follow up in 6 months/Ct scan prior/cbc/cmp- Dr.B  # I reviewed the blood work- with the patient in detail; also reviewed the imaging independently [as summarized above]; and with the patient in detail.

## 2018-04-01 ENCOUNTER — Other Ambulatory Visit: Payer: Self-pay | Admitting: *Deleted

## 2018-04-01 DIAGNOSIS — C3491 Malignant neoplasm of unspecified part of right bronchus or lung: Secondary | ICD-10-CM

## 2018-04-01 DIAGNOSIS — C3411 Malignant neoplasm of upper lobe, right bronchus or lung: Secondary | ICD-10-CM

## 2018-04-01 DIAGNOSIS — T451X5A Adverse effect of antineoplastic and immunosuppressive drugs, initial encounter: Secondary | ICD-10-CM

## 2018-04-01 DIAGNOSIS — G620 Drug-induced polyneuropathy: Secondary | ICD-10-CM

## 2018-04-01 MED ORDER — HYDROCODONE-ACETAMINOPHEN 5-325 MG PO TABS: 1.0000 | ORAL_TABLET | Freq: Three times a day (TID) | ORAL | 0 refills | Status: DC | PRN

## 2018-04-01 NOTE — Telephone Encounter (Signed)
Patient called [facility] requesting refill of Norco.   As mandated by the [place] STOP Act (Strengthen Opioid Misuse Prevention), the [place] Controlled Substance Reporting System ([place]) was reviewed for this patient.  Below is the past 82-months of controlled substance prescriptions as displayed by the registry.  I have personally consulted with my supervising physician, Dr. Rogue Bussing, who agrees that continuation of opiate therapy is medically appropriate at this time and agrees to provide continual monitoring, including urine/blood drug screens, as indicated. Prescription sent electronically using [employer] secure transmission to requested pharmacy.   [place] Reviewed:     Beckey Rutter, DNP, AGNP-C [place] at [facility] [phone] (work cell) [phone] (office) 04/01/18 2:03 PM

## 2018-04-22 DIAGNOSIS — B351 Tinea unguium: Secondary | ICD-10-CM | POA: Diagnosis not present

## 2018-04-22 DIAGNOSIS — E119 Type 2 diabetes mellitus without complications: Secondary | ICD-10-CM | POA: Diagnosis not present

## 2018-05-14 ENCOUNTER — Other Ambulatory Visit: Payer: Self-pay | Admitting: *Deleted

## 2018-05-14 DIAGNOSIS — G620 Drug-induced polyneuropathy: Secondary | ICD-10-CM

## 2018-05-14 DIAGNOSIS — C3491 Malignant neoplasm of unspecified part of right bronchus or lung: Secondary | ICD-10-CM

## 2018-05-14 DIAGNOSIS — C3411 Malignant neoplasm of upper lobe, right bronchus or lung: Secondary | ICD-10-CM

## 2018-05-14 DIAGNOSIS — T451X5A Adverse effect of antineoplastic and immunosuppressive drugs, initial encounter: Secondary | ICD-10-CM

## 2018-05-14 MED ORDER — HYDROCODONE-ACETAMINOPHEN 5-325 MG PO TABS: 1.0000 | ORAL_TABLET | Freq: Three times a day (TID) | ORAL | 0 refills | Status: DC | PRN

## 2018-05-14 NOTE — Telephone Encounter (Signed)
Patient called cancer center requesting refill of Norco 5-325 mg q 8 hours PRN.   As mandated by the [place] STOP Act (Strengthen Opioid Misuse Prevention), the [place] Controlled Substance Reporting System ([place]) was reviewed for this patient.  Below is the past 39-months of controlled substance prescriptions as displayed by the registry.  I have personally consulted with my supervising physician, Dr. Grayland Ormond , who agrees that continuation of opiate therapy is medically appropriate at this time and agrees to provide continual monitoring, including urine/blood drug screens, as indicated. Refill is appropriate on or after 04/30/18.  NCCS[facility] reviewed:     Faythe Casa, NP 05/14/2018 10:23 AM [phone]

## 2018-05-30 DIAGNOSIS — E538 Deficiency of other specified B group vitamins: Secondary | ICD-10-CM | POA: Diagnosis not present

## 2018-05-30 DIAGNOSIS — M545 Low back pain: Secondary | ICD-10-CM | POA: Diagnosis not present

## 2018-05-30 DIAGNOSIS — G250 Essential tremor: Secondary | ICD-10-CM | POA: Diagnosis not present

## 2018-05-30 DIAGNOSIS — E559 Vitamin D deficiency, unspecified: Secondary | ICD-10-CM | POA: Diagnosis not present

## 2018-06-02 ENCOUNTER — Other Ambulatory Visit: Payer: Self-pay | Admitting: Neurology

## 2018-06-02 DIAGNOSIS — M545 Low back pain, unspecified: Secondary | ICD-10-CM

## 2018-06-12 ENCOUNTER — Ambulatory Visit
Admission: RE | Admit: 2018-06-12 | Discharge: 2018-06-12 | Disposition: A | Discharge status: Home / Self Care | Payer: Medicare Other | Source: Ambulatory Visit | Attending: Neurology | Admitting: Neurology

## 2018-06-12 DIAGNOSIS — M545 Low back pain, unspecified: Secondary | ICD-10-CM

## 2018-06-12 DIAGNOSIS — M5126 Other intervertebral disc displacement, lumbar region: Secondary | ICD-10-CM | POA: Diagnosis not present

## 2018-06-12 DIAGNOSIS — M47816 Spondylosis without myelopathy or radiculopathy, lumbar region: Secondary | ICD-10-CM | POA: Diagnosis not present

## 2018-06-12 DIAGNOSIS — M5127 Other intervertebral disc displacement, lumbosacral region: Secondary | ICD-10-CM | POA: Diagnosis not present

## 2018-06-24 DIAGNOSIS — G894 Chronic pain syndrome: Secondary | ICD-10-CM | POA: Diagnosis not present

## 2018-07-01 ENCOUNTER — Other Ambulatory Visit: Payer: Self-pay | Admitting: *Deleted

## 2018-07-01 DIAGNOSIS — C3411 Malignant neoplasm of upper lobe, right bronchus or lung: Secondary | ICD-10-CM

## 2018-07-01 DIAGNOSIS — T451X5A Adverse effect of antineoplastic and immunosuppressive drugs, initial encounter: Secondary | ICD-10-CM

## 2018-07-01 DIAGNOSIS — C3491 Malignant neoplasm of unspecified part of right bronchus or lung: Secondary | ICD-10-CM

## 2018-07-01 DIAGNOSIS — G620 Drug-induced polyneuropathy: Secondary | ICD-10-CM

## 2018-07-01 MED ORDER — HYDROCODONE-ACETAMINOPHEN 5-325 MG PO TABS: 1.0000 | ORAL_TABLET | Freq: Three times a day (TID) | ORAL | 0 refills | Status: DC | PRN

## 2018-07-04 ENCOUNTER — Ambulatory Visit (INDEPENDENT_AMBULATORY_CARE_PROVIDER_SITE_OTHER): Payer: Medicare Other

## 2018-07-04 VITALS — BP 118/60 | HR 61 | Temp 98.1°F | Ht 67.0 in | Wt 197.5 lb

## 2018-07-04 DIAGNOSIS — Z0000 Encounter for general adult medical examination without abnormal findings: Secondary | ICD-10-CM | POA: Diagnosis not present

## 2018-07-04 DIAGNOSIS — N400 Enlarged prostate without lower urinary tract symptoms: Secondary | ICD-10-CM | POA: Diagnosis not present

## 2018-07-04 DIAGNOSIS — E1151 Type 2 diabetes mellitus with diabetic peripheral angiopathy without gangrene: Secondary | ICD-10-CM

## 2018-07-04 DIAGNOSIS — I739 Peripheral vascular disease, unspecified: Secondary | ICD-10-CM | POA: Diagnosis not present

## 2018-07-04 DIAGNOSIS — I10 Essential (primary) hypertension: Secondary | ICD-10-CM | POA: Diagnosis not present

## 2018-07-04 LAB — LIPID PANEL
CHOL/HDL RATIO: 4
Cholesterol: 216 mg/dL — ABNORMAL HIGH (ref 0–200)
HDL: 48.9 mg/dL (ref >39.00)
LDL CALC: 128 mg/dL — AB (ref 0–99)
NONHDL: 166.85
TRIGLYCERIDES: 192 mg/dL — AB (ref 0.0–149.0)
VLDL: 38.4 mg/dL (ref 0.0–40.0)

## 2018-07-04 LAB — CBC WITH DIFFERENTIAL/PLATELET
BASOS ABS: 0.1 10*3/uL (ref 0.0–0.1)
Basophils Relative: 1.1 % (ref 0.0–3.0)
EOS PCT: 3.1 % (ref 0.0–5.0)
Eosinophils Absolute: 0.2 10*3/uL (ref 0.0–0.7)
HEMATOCRIT: 40.8 % (ref 39.0–52.0)
Hemoglobin: 13.4 g/dL (ref 13.0–17.0)
LYMPHS ABS: 1.3 10*3/uL (ref 0.7–4.0)
LYMPHS PCT: 20.7 % (ref 12.0–46.0)
MCHC: 32.9 g/dL (ref 30.0–36.0)
MCV: 98.2 fl (ref 78.0–100.0)
Monocytes Absolute: 0.6 10*3/uL (ref 0.1–1.0)
Monocytes Relative: 9.8 % (ref 3.0–12.0)
NEUTROS ABS: 4.1 10*3/uL (ref 1.4–7.7)
Neutrophils Relative %: 65.3 % (ref 43.0–77.0)
PLATELETS: 246 10*3/uL (ref 150.0–400.0)
RBC: 4.16 Mil/uL — ABNORMAL LOW (ref 4.22–5.81)
RDW: 14.4 % (ref 11.5–15.5)
WBC: 6.3 10*3/uL (ref 4.0–10.5)

## 2018-07-04 LAB — COMPREHENSIVE METABOLIC PANEL
ALT: 19 U/L (ref 0–53)
AST: 18 U/L (ref 0–37)
Albumin: 4 g/dL (ref 3.5–5.2)
Alkaline Phosphatase: 79 U/L (ref 39–117)
BUN: 22 mg/dL (ref 6–23)
CALCIUM: 9.1 mg/dL (ref 8.4–10.5)
CHLORIDE: 100 meq/L (ref 96–112)
CO2: 37 meq/L — AB (ref 19–32)
CREATININE: 0.72 mg/dL (ref 0.40–1.50)
GFR: 105.58 mL/min (ref >60.00)
GLUCOSE: 116 mg/dL — AB (ref 70–99)
Potassium: 4.4 mEq/L (ref 3.5–5.1)
Sodium: 142 mEq/L (ref 135–145)
Total Bilirubin: 0.4 mg/dL (ref 0.2–1.2)
Total Protein: 7.4 g/dL (ref 6.0–8.3)

## 2018-07-04 LAB — HEMOGLOBIN A1C: Hgb A1c MFr Bld: 6.3 % (ref 4.6–6.5)

## 2018-07-04 LAB — PSA, MEDICARE: PSA: 0.58 ng/ml (ref 0.10–4.00)

## 2018-07-04 NOTE — Progress Notes (Signed)
Subjective:   Joseph Graham is a 78 y.o. male who presents for Medicare Annual/Subsequent preventive examination.  Review of Systems:  N/A Cardiac Risk Factors include: advanced age (>54men, >3 women);obesity (BMI >30kg/m2);male gender;hypertension     Objective:    Vitals: BP 118/60 (BP Location: Left Arm, Patient Position: Sitting, Cuff Size: Normal)   Pulse 61   Temp 98.1 F (36.7 C) (Oral)   Ht 5\' 7"  (1.702 m) Comment: shoes  Wt 197 lb 8 oz (89.6 kg)   SpO2 92%   BMI 30.93 kg/m   Body mass index is 30.93 kg/m.  Advanced Directives 07/04/2018 12/11/2017 10/24/2017 09/25/2017 03/25/2017 12/10/2016 11/23/2016  Does Patient Have a Medical Advance Directive? Yes Yes Yes Yes Yes Yes Yes  Type of Paramedic of [place];Living will Living will;Healthcare Power of [place];Living will Living will;Healthcare Power of [place];Living will [place];Living will -  Does patient want to make changes to medical advance directive? - - No - Patient declined No - Patient declined No - Patient declined - -  Copy of [place] in Chart? Yes - validated most recent copy scanned in chart (See row information) No - copy requested - No - copy requested Yes - -  Would patient like information on creating a medical advance directive? - No - Patient declined - - No - Patient declined - -    Tobacco Social History   Tobacco Use  Smoking Status Former Smoker  . Packs/day: 1.50  . Years: 61.00  . Pack years: 91.50  . Types: Cigarettes  . Last attempt to quit: 03/08/2015  . Years since quitting: 3.3  Smokeless Tobacco Never Used  Tobacco Comment   cut back 0.5 cigarettes--stopped 03/07/15; now now chantix     Counseling given: No Comment: cut back 0.5 cigarettes--stopped 03/07/15; now now chantix   Clinical Intake:  Pre-visit preparation completed: Yes  Pain : 0-10 Pain Score:  3  Pain Type: Chronic pain Pain Location: Generalized(feet, neck, back)     Nutritional Status: BMI > 30  Obese Nutritional Risks: None  How often do you need to have someone help you when you read instructions, pamphlets, or other written materials from your doctor or pharmacy?: 1 - Never What is the last grade level you completed in school?: Bachelor degree  Interpreter Needed?: No  Comments: pt lives with spouse Information entered by :: LPinson, LPN  Past Medical History:  Diagnosis Date  . Alcohol abuse, in remission    remote   . Arthritis    BACK AND NECK  . Benign essential tremor    improved on metoprolol and gabapentin  . BPH (benign prostatic hypertrophy) 12/30/2012  . Cardiac arrhythmia 03/2010   h/o a flutter and CM, s/p ablation, normal stress test  . Cataract    bilat removed   . Chemotherapy-induced neutropenia ([place]) 09/23/2015  . Chronic low back pain    MRI 2004, multilevel DDD  . Colon polyps    next colonoscopy due around 2018  . Complication of anesthesia    DID NOT GET COMPLETELY NUMB WITH TONSILLECTOMY  . COPD (chronic obstructive pulmonary disease) ([place])   . Coronary artery disease   . Diabetes mellitus without complication ([place])   . Dyslipidemia    mild off meds  . GERD (gastroesophageal reflux disease)   . HTN (hypertension)   . Macular degeneration, bilateral   . Multiple pulmonary nodules 02/2015  .  Neuromuscular disorder ([place])    neuropathy from chemo....in feet  . Peripheral neuropathy due to chemotherapy ([place]) 12/23/2015   Saw Dr Manuella Ghazi, on gabapentin and cymbalta with hydrocodone PRN (03/2016)  . Personal history of tobacco use, presenting hazards to health 02/18/2015   quit 06/2011, restarted 2014  . Pneumothorax after biopsy 04/01/2015  . Primary cancer of bladder ([place]) 2016   [place]  . Primary lung cancer ([place]) 2016   port a cath removed 03/2016  . Shortness of breath dyspnea    OCC WITH EXERTION   Past Surgical History:    Procedure Laterality Date  . A FLUTTER ABLATION  03/2010  . carotid US  03/2010   WNL  . CATARACT EXTRACTION    . COLONOSCOPY  09/2011   polyps, rpt due 5 yrs, mild diverticulosis [name])  . CYSTOSCOPY W/ RETROGRADES Bilateral 04/06/2015   Procedure: CYSTOSCOPY WITH RETROGRADE PYELOGRAM;  Surgeon: [name], MD;  Location: [facility] ORS;  Service: Urology;  Laterality: Bilateral;  . ELECTROMAGNETIC NAVIGATION BROCHOSCOPY N/A 03/02/2015   Procedure: ELECTROMAGNETIC NAVIGATION BRONCHOSCOPY;  Surgeon: [name], MD;  Location: [facility] OR[facility];  Service: Cardiopulmonary;  Laterality: N/A;  . [place]  . POLYPECTOMY    . PORT-A-CATH REMOVAL Right 04/03/2016   Procedure: REMOVAL PORT-A-CATH;  Surgeon: [name], MD;  Location: [facility];  Service: General;  Laterality: Right;  . PORTACATH PLACEMENT N/A 08/10/2015   Procedure: INSERTION PORT-A-CATH;  Surgeon: [name], MD;  Location: [facility];  Service: General;  Laterality: N/A;  . [place]  . TRANSURETHRAL RESECTION OF BLADDER TUMOR N/A 04/06/2015   Procedure: TRANSURETHRAL RESECTION OF BLADDER TUMOR (TURBT) right ureteral stent placement;  Surgeon: [name], MD;  Location: [facility];  Service: Urology;  Laterality: N/A;  . TRANSURETHRAL RESECTION OF BLADDER TUMOR N/A 07/13/2015   Procedure: TRANSURETHRAL RESECTION OF BLADDER TUMOR (TURBT);  Surgeon: [name], MD;  Location: [facility];  Service: Urology;  Laterality: N/A;  . urinary stent removal  04/14/15   Family History  Problem Relation Age of Onset  . Cervical cancer Mother 65  . Hypertension Father   . Tremor Father        and several uncles/aunts (not parkinson's)  . Stroke Brother 4  . Bladder Cancer Brother 50  . Prostate cancer Neg Hx   . Kidney cancer Neg Hx   . Colon cancer Neg Hx   . Colon polyps Neg Hx   . Esophageal cancer Neg Hx   . Rectal cancer Neg Hx    Social History   Socioeconomic History  . Marital status:  Married    Spouse name: Not on file  . Number of children: Not on file  . Years of education: Not on file  . Highest education level: Not on file  Occupational History  . Not on file  Social Needs  . Financial resource strain: Not on file  . Food insecurity:    Worry: Not on file    Inability: Not on file  . Transportation needs:    Medical: Not on file    Non-medical: Not on file  Tobacco Use  . Smoking status: Former Smoker    Packs/day: 1.50    Years: 61.00    Pack years: 91.50    Types: Cigarettes    Last attempt to quit: 03/08/2015    Years since quitting: 3.3  . Smokeless tobacco: Never Used  . Tobacco comment: cut back 0.5 cigarettes--stopped 03/07/15; now now chantix  Substance and Sexual Activity  . Alcohol use: No    Alcohol/week: 0.0 standard drinks    Comment: has not drank in 37 years  . Drug use: No  . Sexual activity: Never  Lifestyle  . Physical activity:    Days per week: Not on file    Minutes per session: Not on file  . Stress: Not on file  Relationships  . Social connections:    Talks on phone: Not on file    Gets together: Not on file    Attends religious service: Not on file    Active member of club or organization: Not on file    Attends meetings of clubs or organizations: Not on file    Relationship status: Not on file  Other Topics Concern  . Not on file  Social History Narrative   Caffeine: 1 cup decaf/day   Lives with wife   Occupation: retired, worked Press photographer   Activity: gym 3x/wk, Immunologist, hobbies (paint, building things)   Diet: healthy, good water, fruits/vegetables daily, red meat 1x/wk    Outpatient Encounter Medications as of 07/04/2018  Medication Sig  . Acetaminophen (TYLENOL EXTRA STRENGTH PO) Take by mouth as needed.  . Cholecalciferol (VITAMIN D3 PO) Take 5,000 mg by mouth daily.  . Cyanocobalamin (B-12 PO) Take 100 mcg by mouth daily.   Marland Kitchen gabapentin (NEURONTIN) 300 MG capsule TAKE 1 CAPSULE IN THE MORNING AND 1 CAPSULE IN  AFTERNOON, AND 2 CAPSULES AT NIGHT.  Marland Kitchen HYDROcodone-acetaminophen (NORCO) 5-325 MG tablet Take 1 tablet by mouth every 8 (eight) hours as needed for moderate pain.  Marland Kitchen lisinopril (PRINIVIL,ZESTRIL) 10 MG tablet Take 1 tablet (10 mg total) by mouth every evening.  . metoprolol tartrate (LOPRESSOR) 25 MG tablet Take 0.5 tablets (12.5 mg total) by mouth 2 (two) times daily.  . Multiple Vitamin (MULTI-VITAMINS) TABS Take 1 tablet by mouth daily.   . NON FORMULARY Hemp Oil 5000 mg  . omeprazole (PRILOSEC) 20 MG capsule Take 20 mg by mouth daily.  . polyethylene glycol (MIRALAX / GLYCOLAX) packet Take 17 g by mouth daily.  . primidone (MYSOLINE) 50 MG tablet Take 50 mg by mouth 2 (two) times daily. Takes 2 tablets in the morning and 2 tablets in the evening  . VITAMIN E PO Take 400 g by mouth daily.   . [DISCONTINUED] gabapentin (NEURONTIN) 300 MG capsule Take 1 capsule (300 mg total) by mouth 3 (three) times daily. (Patient taking differently: Take 300 mg by mouth 3 (three) times daily. 1 tablet in am,  1 tablet at lunch, and 2 tablets at bedtime (600 mg))  . [DISCONTINUED] Magnesium Cl-Calcium Carbonate (SLOW-MAG PO) Take 1 tablet by mouth daily.  . [DISCONTINUED] tiotropium (SPIRIVA HANDIHALER) 18 MCG inhalation capsule Place 1 capsule (18 mcg total) into inhaler and inhale at bedtime.   No facility-administered encounter medications on file as of 07/04/2018.     Activities of Daily Living In your present state of health, do you have any difficulty performing the following activities: 07/04/2018  Hearing? N  Vision? N  Difficulty concentrating or making decisions? N  Walking or climbing stairs? Y  Dressing or bathing? N  Doing errands, shopping? N  Preparing Food and eating ? N  Using the Toilet? N  In the past six months, have you accidently leaked urine? N  Do you have problems with loss of bowel control? N  Managing your Medications? N  Managing your Finances? N  Housekeeping or managing  your Housekeeping?  N  Some recent data might be hidden    Patient Care Team: [name], MD as PCP - General (Family Medicine) Cammie Sickle, MD as Consulting Physician (Internal Medicine) [name], MD as Consulting Physician (Urology) Vladimir Crofts, MD as Consulting Physician (Neurology) Minna Merritts, MD as Consulting Physician (Cardiology) Noreene Filbert, MD as Referring Physician (Radiation Oncology) [name], MD as Referring Physician (Cardiothoracic Surgery)   Assessment:   This is a routine wellness examination for Ori.   Hearing Screening   125Hz  250Hz  500Hz  1000Hz  2000Hz  3000Hz  4000Hz  6000Hz  8000Hz   Right ear:   40 40 40  40    Left ear:   40 40 40  40    Vision Screening Comments: Vision exam in 2019 with Dr. Gustavus Messing   Exercise Activities and Dietary recommendations Current Exercise Habits: Home exercise routine, Type of exercise: walking, Time (Minutes): 45, Frequency (Times/Week): 7, Weekly Exercise (Minutes/Week): 315, Intensity: Mild, Exercise limited by: respiratory conditions(s)  Goals    . Patient Stated     Starting 07/04/2018, I will continue to take medications as prescribed.        Fall Risk Fall Risk  07/04/2018 12/11/2017 10/24/2017 07/03/2017 12/10/2016  Falls in the past year? 0 No No No No  Risk for fall due to : - - Impaired balance/gait - -   Depression Screen PHQ 2/9 Scores 07/04/2018 12/11/2017 10/24/2017 07/03/2017  PHQ - 2 Score 0 0 0 0  PHQ- 9 Score 0 - - -    Cognitive Function MMSE - Mini Mental State Exam 07/04/2018 06/25/2016  Orientation to time 5 5  Orientation to Place 5 5  Registration 3 3  Attention/ Calculation 0 0  Recall 1 1  Recall-comments unable to recall 2 of 3 words pt was unable to recall 2 of 3 words  Language- name 2 objects 0 0  Language- repeat 1 1  Language- follow 3 step command 3 3  Language- read & follow direction 0 0  Write a sentence 0 0  Copy design 0 0  Total score 18 18      PLEASE NOTE: A Mini-Cog screen was completed. Maximum score is 20. A value of 0 denotes this part of [place] MMSE was not completed or the patient failed this part of the Mini-Cog screening.   Mini-Cog Screening Orientation to Time - Max 5 pts Orientation to Place - Max 5 pts Registration - Max 3 pts Recall - Max 3 pts Language Repeat - Max 1 pts Language Follow 3 Step Command - Max 3 pts   Immunization History  Administered Date(s) Administered  . Influenza,inj,Quad PF,6+ Mos 07/03/2017  . Pneumococcal Conjugate-13 01/05/2014  . Pneumococcal Polysaccharide-23 06/11/2010  . Td 07/31/2011    Screening Tests Health Maintenance  Topic Date Due  . COLONOSCOPY  06/11/2019 (Originally 09/17/2016)  . DTaP/Tdap/Td (1 - Tdap) 07/30/2021 (Originally 07/03/1951)  . OPHTHALMOLOGY EXAM  09/10/2018  . HEMOGLOBIN A1C  01/02/2019  . FOOT EXAM  01/16/2019  . TETANUS/TDAP  07/30/2021  . PNA vac Low Risk Adult  Completed       Plan:     I have personally reviewed, addressed, and noted the following in the patient's chart:  A. Medical and social history B. Use of alcohol, tobacco or illicit drugs  C. Current medications and supplements D. Functional ability and status E.  Nutritional status F.  Physical activity G. Advance directives H. List of other physicians I.  Hospitalizations, surgeries, and ER  visits in previous 12 months J.  [place] to include hearing, vision, cognitive, depression L. Referrals and appointments - none  In addition, I have reviewed and discussed with patient certain preventive protocols, quality metrics, and best practice recommendations. A written personalized care plan for preventive services as well as general preventive health recommendations were provided to patient.  See attached scanned questionnaire for additional information.   Signed,   Lindell Noe, MHA, BS, LPN Health Coach

## 2018-07-04 NOTE — Progress Notes (Signed)
PCP notes:   Health maintenance:  Colonoscopy - PCP follow-up requested A1C - completed  Abnormal screenings:   Mini-Cog score: 18/20 MMSE - Mini Mental State Exam 07/04/2018 06/25/2016  Orientation to time 5 5  Orientation to Place 5 5  Registration 3 3  Attention/ Calculation 0 0  Recall 1 1  Recall-comments unable to recall 2 of 3 words pt was unable to recall 2 of 3 words  Language- name 2 objects 0 0  Language- repeat 1 1  Language- follow 3 step command 3 3  Language- read & follow direction 0 0  Write a sentence 0 0  Copy design 0 0  Total score 18 18     Patient concerns:   Patient would like to discuss having flu vaccine.   Nurse concerns:  None  Next PCP appt:   07/08/18 @ 1030

## 2018-07-04 NOTE — Patient Instructions (Signed)
Mr. Glorioso , Thank you for taking time to come for your Medicare Wellness Visit. I appreciate your ongoing commitment to your health goals. Please review the following plan we discussed and let me know if I can assist you in the future.   These are the goals we discussed: Goals    . Patient Stated     Starting 07/04/2018, I will continue to take medications as prescribed.        This is a list of the screening recommended for you and due dates:  Health Maintenance  Topic Date Due  . Colon Cancer Screening  06/11/2019*  . DTaP/Tdap/Td vaccine (1 - Tdap) 07/30/2021*  . Eye exam for diabetics  09/10/2018  . Hemoglobin A1C  01/02/2019  . Complete foot exam   01/16/2019  . Tetanus Vaccine  07/30/2021  . Pneumonia vaccines  Completed  *Topic was postponed. The date shown is not the original due date.   Preventive Care for Adults  A healthy lifestyle and preventive care can promote health and wellness. Preventive health guidelines for adults include the following key practices.  . A routine yearly physical is a good way to check with your health care provider about your health and preventive screening. It is a chance to share any concerns and updates on your health and to receive a thorough exam.  . Visit your dentist for a routine exam and preventive care every 6 months. Brush your teeth twice a day and floss once a day. Good oral hygiene prevents tooth decay and gum disease.  . The frequency of eye exams is based on your age, health, family medical history, use  of contact lenses, and other factors. Follow your health care provider's recommendations for frequency of eye exams.  . Eat a healthy diet. Foods like vegetables, fruits, whole grains, low-fat dairy products, and lean protein foods contain the nutrients you need without too many calories. Decrease your intake of foods high in solid fats, added sugars, and salt. Eat the right amount of calories for you. Get information about a  proper diet from your health care provider, if necessary.  . Regular physical exercise is one of the most important things you can do for your health. Most adults should get at least 150 minutes of moderate-intensity exercise (any activity that increases your heart rate and causes you to sweat) each week. In addition, most adults need muscle-strengthening exercises on 2 or more days a week.  Silver Sneakers may be a benefit available to you. To determine eligibility, you may visit the website: www.silversneakers.com or contact program at 715-674-3732 Mon-Fri between 8AM-8PM.   . Maintain a healthy weight. The body mass index (BMI) is a screening tool to identify possible weight problems. It provides an estimate of body fat based on height and weight. Your health care provider can find your BMI and can help you achieve or maintain a healthy weight.   For adults 20 years and older: ? A BMI below 18.5 is considered underweight. ? A BMI of 18.5 to 24.9 is normal. ? A BMI of 25 to 29.9 is considered overweight. ? A BMI of 30 and above is considered obese.   . Maintain normal blood lipids and cholesterol levels by exercising and minimizing your intake of saturated fat. Eat a balanced diet with plenty of fruit and vegetables. Blood tests for lipids and cholesterol should begin at age 64 and be repeated every 5 years. If your lipid or cholesterol levels are high, you are  over 27, or you are at high risk for heart disease, you may need your cholesterol levels checked more frequently. Ongoing high lipid and cholesterol levels should be treated with medicines if diet and exercise are not working.  . If you smoke, find out from your health care provider how to quit. If you do not use tobacco, please do not start.  . If you choose to drink alcohol, please do not consume more than 2 drinks per day. One drink is considered to be 12 ounces (355 mL) of beer, 5 ounces (148 mL) of wine, or 1.5 ounces (44 mL) of  liquor.  . If you are 26-50 years old, ask your health care provider if you should take aspirin to prevent strokes.  . Use sunscreen. Apply sunscreen liberally and repeatedly throughout the day. You should seek shade when your shadow is shorter than you. Protect yourself by wearing long sleeves, pants, a wide-brimmed hat, and sunglasses year round, whenever you are outdoors.  . Once a month, do a whole body skin exam, using a mirror to look at the skin on your back. Tell your health care provider of new moles, moles that have irregular borders, moles that are larger than a pencil eraser, or moles that have changed in shape or color.

## 2018-07-08 ENCOUNTER — Ambulatory Visit (INDEPENDENT_AMBULATORY_CARE_PROVIDER_SITE_OTHER): Payer: Medicare Other | Admitting: Family Medicine

## 2018-07-08 ENCOUNTER — Encounter: Payer: Self-pay | Admitting: Family Medicine

## 2018-07-08 VITALS — BP 140/64 | HR 62 | Temp 97.8°F | Ht 67.0 in | Wt 198.5 lb

## 2018-07-08 DIAGNOSIS — I739 Peripheral vascular disease, unspecified: Secondary | ICD-10-CM

## 2018-07-08 DIAGNOSIS — Z23 Encounter for immunization: Secondary | ICD-10-CM

## 2018-07-08 DIAGNOSIS — Z0000 Encounter for general adult medical examination without abnormal findings: Secondary | ICD-10-CM

## 2018-07-08 DIAGNOSIS — G620 Drug-induced polyneuropathy: Secondary | ICD-10-CM

## 2018-07-08 DIAGNOSIS — Z860100 Personal history of colon polyps, unspecified: Secondary | ICD-10-CM

## 2018-07-08 DIAGNOSIS — N400 Enlarged prostate without lower urinary tract symptoms: Secondary | ICD-10-CM

## 2018-07-08 DIAGNOSIS — R0902 Hypoxemia: Secondary | ICD-10-CM

## 2018-07-08 DIAGNOSIS — G250 Essential tremor: Secondary | ICD-10-CM

## 2018-07-08 DIAGNOSIS — T451X5A Adverse effect of antineoplastic and immunosuppressive drugs, initial encounter: Secondary | ICD-10-CM

## 2018-07-08 DIAGNOSIS — C3411 Malignant neoplasm of upper lobe, right bronchus or lung: Secondary | ICD-10-CM | POA: Diagnosis not present

## 2018-07-08 DIAGNOSIS — M5136 Other intervertebral disc degeneration, lumbar region: Secondary | ICD-10-CM

## 2018-07-08 DIAGNOSIS — E782 Mixed hyperlipidemia: Secondary | ICD-10-CM

## 2018-07-08 DIAGNOSIS — Z87891 Personal history of nicotine dependence: Secondary | ICD-10-CM

## 2018-07-08 DIAGNOSIS — C679 Malignant neoplasm of bladder, unspecified: Secondary | ICD-10-CM | POA: Diagnosis not present

## 2018-07-08 DIAGNOSIS — I10 Essential (primary) hypertension: Secondary | ICD-10-CM

## 2018-07-08 DIAGNOSIS — J449 Chronic obstructive pulmonary disease, unspecified: Secondary | ICD-10-CM

## 2018-07-08 DIAGNOSIS — M51369 Other intervertebral disc degeneration, lumbar region without mention of lumbar back pain or lower extremity pain: Secondary | ICD-10-CM

## 2018-07-08 DIAGNOSIS — E1151 Type 2 diabetes mellitus with diabetic peripheral angiopathy without gangrene: Secondary | ICD-10-CM

## 2018-07-08 MED ORDER — NONFORMULARY OR COMPOUNDED ITEM: Status: DC

## 2018-07-08 NOTE — Progress Notes (Signed)
BP 140/64 (BP Location: Right Arm, Patient Position: Sitting, Cuff Size: Normal)   Pulse 62   Temp 97.8 F (36.6 C) (Oral)   Ht 5\' 7"  (1.702 m)   Wt 198 lb 8 oz (90 kg)   SpO2 95% Comment: 2 L, pulsating  BMI 31.09 kg/m    CC: CPE Subjective:    Patient ID: Joseph Graham, male    DOB: [birth year], 78 y.o.   MRN: [id number]  HPI: Joseph Graham is a 78 y.o. male presenting on 07/08/2018 for Annual Exam (Pt 2. )   Saw Katha Cabal last week for medicare wellness visit. Note reviewed.    Established with [facility] neurosurgery (Dr [name]) for chronic back pain - MRI with DDD but no target for surgical intervention - referred to pain clinic. Upcoming appt next week. Hydrocodone price marked up to $44/month, wants to taper off (prescribed by Dr Rogue Bussing). Currently taking 2-3/day through onc. Also takes gabapentin 4 tab/day (Dr Manuella Ghazi) and tylenol 1000mg  tid.   Weight dropping - has modified diet.  Taking CBD oil twice daily.  Easily fatigued - with walking 50-100 ft.   Preventative: Colonoscopy - 09/2011 polyps, rpt due 5 yrs, mild diverticulosis [name]) - see above cancelled due to hypoxia. requests cologuard today.  Lung cancer screening -s/p lung cancer treatment Prostate - mild BPH.Decided to age out. Flu - yearly - requests high dose. Td 07/2011.  Pneumonia - 2012. prevnar - 12/2013. zostavax - declines.Never had chicken pox. shingrix - discussed.  Advanced directives:previously reviewed, forms scanned 04/07/2015. Would not want extended life support. Does want CPR and life support if deemed reversible/temporary.[place] daughter are HCPOA.  Seat belt use discussed Sunscreen use discussed. No changing moles noted. Ex smoker - quit 02/2016. Wife still smokes.  Alcohol - no Dentist - has not seen - no dental insurance Eye exam q6 mo - macular degeneration  Caffeine: 1 cup decaf/week Lives with wife Occupation: retired, worked Press photographer Activity: limited by dyspnea and  neuropathy  Diet: healthy, good water, fruits/vegetables daily, red meat 1x/wk     Relevant past medical, surgical, family and social history reviewed and updated as indicated. Interim medical history since our last visit reviewed. Allergies and medications reviewed and updated. Outpatient Medications Prior to Visit  Medication Sig Dispense Refill  . Acetaminophen (TYLENOL EXTRA STRENGTH PO) Take by mouth as needed.    . Cholecalciferol (VITAMIN D3 PO) Take 5,000 mg by mouth daily.    . Cyanocobalamin (B-12 PO) Take 100 mcg by mouth daily.     Marland Kitchen gabapentin (NEURONTIN) 300 MG capsule TAKE 1 CAPSULE IN THE MORNING AND 1 CAPSULE IN AFTERNOON, AND 2 CAPSULES AT NIGHT.    Marland Kitchen HYDROcodone-acetaminophen (NORCO) 5-325 MG tablet Take 1 tablet by mouth every 8 (eight) hours as needed for moderate pain. 90 tablet 0  . lisinopril (PRINIVIL,ZESTRIL) 10 MG tablet Take 1 tablet (10 mg total) by mouth every evening. 90 tablet 3  . metoprolol tartrate (LOPRESSOR) 25 MG tablet Take 0.5 tablets (12.5 mg total) by mouth 2 (two) times daily. 90 tablet 3  . Multiple Vitamin (MULTI-VITAMINS) TABS Take 1 tablet by mouth daily.     . NON FORMULARY Hemp Oil 5000 mg    . omeprazole (PRILOSEC) 20 MG capsule Take 20 mg by mouth daily.    . polyethylene glycol (MIRALAX / GLYCOLAX) packet Take 17 g by mouth daily.    . primidone (MYSOLINE) 50 MG tablet Take 50 mg by mouth 2 (two) times daily. Takes  2 tablets in the morning and 2 tablets in the evening  1  . VITAMIN E PO Take 400 g by mouth daily.      No facility-administered medications prior to visit.      Per HPI unless specifically indicated in ROS section below Review of Systems  Constitutional: Negative for activity change, appetite change, chills, fatigue, fever and unexpected weight change.  HENT: Negative for hearing loss.   Eyes: Negative for visual disturbance.  Respiratory: Positive for shortness of breath (chronic). Negative for cough, chest tightness and  wheezing.   Cardiovascular: Negative for chest pain, palpitations and leg swelling.  Gastrointestinal: Positive for constipation (narcotic related). Negative for abdominal distention, abdominal pain, blood in stool, diarrhea, nausea and vomiting.       Using stouffer's sugar free candy as laxative  Genitourinary: Negative for difficulty urinating and hematuria.  Musculoskeletal: Negative for arthralgias, myalgias and neck pain.  Skin: Negative for rash.  Neurological: Negative for dizziness, seizures, syncope and headaches.  Hematological: Negative for adenopathy. Does not bruise/bleed easily.  Psychiatric/Behavioral: Negative for dysphoric mood. The patient is not nervous/anxious.    Objective:    BP 140/64 (BP Location: Right Arm, Patient Position: Sitting, Cuff Size: Normal)   Pulse 62   Temp 97.8 F (36.6 C) (Oral)   Ht 5\' 7"  (1.702 m)   Wt 198 lb 8 oz (90 kg)   SpO2 95% Comment: 2 L, pulsating  BMI 31.09 kg/m   Wt Readings from Last 3 Encounters:  07/08/18 198 lb 8 oz (90 kg)  07/04/18 197 lb 8 oz (89.6 kg)  03/21/18 208 lb (94.3 kg)    Physical Exam Vitals signs and nursing note reviewed.  Constitutional:      General: He is not in acute distress.    Appearance: He is well-developed.     Comments: [place] in place, using rolling walker  HENT:     Head: Normocephalic and atraumatic.     Right Ear: Hearing, tympanic membrane, ear canal and external ear normal.     Left Ear: Hearing, tympanic membrane, ear canal and external ear normal.     Nose: Nose normal.     Mouth/Throat:     Mouth: Mucous membranes are moist.     Pharynx: Oropharynx is clear. Uvula midline. No oropharyngeal exudate or posterior oropharyngeal erythema.  Eyes:     General: No scleral icterus.    Conjunctiva/sclera: Conjunctivae normal.     Pupils: Pupils are equal, round, and reactive to light.  Neck:     Musculoskeletal: Normal range of motion and neck supple.  Cardiovascular:     Rate and Rhythm:  Normal rate and regular rhythm.     Pulses: Normal pulses.          Radial pulses are 2+ on the right side and 2+ on the left side.     Heart sounds: Normal heart sounds. No murmur.  Pulmonary:     Effort: Pulmonary effort is normal. No respiratory distress.     Breath sounds: No wheezing, rhonchi or rales.     Comments: Crackles bibasilarly Abdominal:     General: Bowel sounds are normal. There is no distension.     Palpations: Abdomen is soft. There is no mass.     Tenderness: There is no abdominal tenderness. There is no guarding or rebound.  Musculoskeletal: Normal range of motion.  Lymphadenopathy:     Cervical: No cervical adenopathy.  Skin:    General: Skin is warm and  dry.     Findings: No rash.  Neurological:     Mental Status: He is alert and oriented to person, place, and time.     Comments: CN grossly intact, station and gait intact  Psychiatric:        Behavior: Behavior normal.        Thought Content: Thought content normal.        Judgment: Judgment normal.       Results for orders placed or performed in visit on 07/04/18  PSA, Medicare ([place])  Result Value Ref Range   PSA 0.58 0.10 - 4.00 ng/ml  CBC with Differential/Platelet  Result Value Ref Range   WBC 6.3 4.0 - 10.5 K/uL   RBC 4.16 (L) 4.22 - 5.81 Mil/uL   Hemoglobin 13.4 13.0 - 17.0 g/dL   HCT 40.8 39.0 - 52.0 %   MCV 98.2 78.0 - 100.0 fl   MCHC 32.9 30.0 - 36.0 g/dL   RDW 14.4 11.5 - 15.5 %   Platelets 246.0 150.0 - 400.0 K/uL   Neutrophils Relative % 65.3 43.0 - 77.0 %   Lymphocytes Relative 20.7 12.0 - 46.0 %   Monocytes Relative 9.8 3.0 - 12.0 %   Eosinophils Relative 3.1 0.0 - 5.0 %   Basophils Relative 1.1 0.0 - 3.0 %   Neutro Abs 4.1 1.4 - 7.7 K/uL   Lymphs Abs 1.3 0.7 - 4.0 K/uL   Monocytes Absolute 0.6 0.1 - 1.0 K/uL   Eosinophils Absolute 0.2 0.0 - 0.7 K/uL   Basophils Absolute 0.1 0.0 - 0.1 K/uL  Hemoglobin A1c  Result Value Ref Range   Hgb A1c MFr Bld 6.3 4.6 - 6.5 %  Lipid  Panel  Result Value Ref Range   Cholesterol 216 (H) 0 - 200 mg/dL   Triglycerides 192.0 (H) 0.0 - 149.0 mg/dL   HDL 48.90 >39.00 mg/dL   VLDL 38.4 0.0 - 40.0 mg/dL   LDL Cholesterol 128 (H) 0 - 99 mg/dL   Total CHOL/HDL Ratio 4    NonHDL 166.85   Comprehensive metabolic panel  Result Value Ref Range   Sodium 142 135 - 145 mEq/L   Potassium 4.4 3.5 - 5.1 mEq/L   Chloride 100 96 - 112 mEq/L   CO2 37 (H) 19 - 32 mEq/L   Glucose, Bld 116 (H) 70 - 99 mg/dL   BUN 22 6 - 23 mg/dL   Creatinine, Ser 0.72 0.40 - 1.50 mg/dL   Total Bilirubin 0.4 0.2 - 1.2 mg/dL   Alkaline Phosphatase 79 39 - 117 U/L   AST 18 0 - 37 U/L   ALT 19 0 - 53 U/L   Total Protein 7.4 6.0 - 8.3 g/dL   Albumin 4.0 3.5 - 5.2 g/dL   Calcium 9.1 8.4 - 10.5 mg/dL   GFR 105.58 >60.00 mL/min   Assessment & Plan:   Problem List Items Addressed This Visit    Primary cancer of bladder ([place])    S/p resection, but needs continued surveillance. Will need to verify he continues seeing urology       Personal history of colonic polyps - adenomas    Colonoscopy cancelled due to hypoxia. Pt requests cologuard - will order.       Peripheral neuropathy due to chemotherapy (HCC)    Continue gabapentin. Sees neurology.       PAD (peripheral artery disease) (HCC)   Malignant neoplasm of right upper lobe of lung ([place])    Continues seeing oncology under active  surveillance. Q44mo .      HTN (hypertension)    Chronic, adequate control.      HLD (hyperlipidemia)    Chol levels elevated, pt off medication at this time. The 10-year ASCVD risk score Mikey Bussing DC Brooke Bonito., et al., 2013) is: 61.3%   Values used to calculate the score:     Age: 2 years     Sex: Male     Is Non-Hispanic African American: No     Diabetic: Yes     Tobacco smoker: No     Systolic Blood Pressure: 486 mmHg     Is BP treated: Yes     HDL Cholesterol: 48.9 mg/dL     Total Cholesterol: 216 mg/dL       Health maintenance examination - Primary     Preventative protocols reviewed and updated unless pt declined. Discussed healthy diet and lifestyle.       Ex-smoker    Remains abstinent. Wife continues to smoke.       Diabetes mellitus type 2 with peripheral artery disease (HCC)    Chronic, stable off medication.       DDD (degenerative disc disease), lumbar    Followed by Dr [name] [facility] Neurosurgery, planned referral to pain clinic. Already taking hydrocodone and gabapentin.       COPD with hypoxia (HCC)    Chronic, stable on supplemental oxygen.       Benign prostatic hyperplasia    Chronic, stable off medication.      Benign essential tremor    Continue primidone, followed by neurologyy       Other Visit Diagnoses    Need for influenza vaccination       Relevant Orders   Flu Vaccine QUAD 36+ mos IM (Completed)       Meds ordered this encounter  Medications  . NONFORMULARY OR COMPOUNDED ITEM    Sig: CBD oil oral BID   Orders Placed This Encounter  Procedures  . Flu Vaccine QUAD 36+ mos IM    Follow up plan: Return in about 6 months (around 01/06/2019) for follow up visit.  [name], MD

## 2018-07-08 NOTE — Patient Instructions (Addendum)
flu shot today.  We will sign you up for cologuard.  If interested, check with pharmacy about new 2 shot shingles series (shingrix). Good to see you today, call us with questions. Return in 6 months for follow up visit   Health Maintenance After Age 78 After age 35, you are at a higher risk for certain long-term diseases and infections as well as injuries from falls. Falls are a major cause of broken bones and head injuries in people who are older than age 54. Getting regular preventive care can help to keep you healthy and well. Preventive care includes getting regular testing and making lifestyle changes as recommended by your health care provider. Talk with your health care provider about:  Which screenings and tests you should have. A screening is a test that checks for a disease when you have no symptoms.  A diet and exercise plan that is right for you. What should I know about screenings and tests to prevent falls? Screening and testing are the best ways to find a health problem early. Early diagnosis and treatment give you the best chance of managing medical conditions that are common after age 41. Certain conditions and lifestyle choices may make you more likely to have a fall. Your health care provider may recommend:  Regular vision checks. Poor vision and conditions such as cataracts can make you more likely to have a fall. If you wear glasses, make sure to get your prescription updated if your vision changes.  Medicine review. Work with your health care provider to regularly review all of the medicines you are taking, including over-the-counter medicines. Ask your health care provider about any side effects that may make you more likely to have a fall. Tell your health care provider if any medicines that you take make you feel dizzy or sleepy.  Osteoporosis screening. Osteoporosis is a condition that causes the bones to get weaker. This can make the bones weak and cause them to break  more easily.  Blood pressure screening. Blood pressure changes and medicines to control blood pressure can make you feel dizzy.  Strength and balance checks. Your health care provider may recommend certain tests to check your strength and balance while standing, walking, or changing positions.  Foot health exam. Foot pain and numbness, as well as not wearing proper footwear, can make you more likely to have a fall.  Depression screening. You may be more likely to have a fall if you have a fear of falling, feel emotionally low, or feel unable to do activities that you used to do.  Alcohol use screening. Using too much alcohol can affect your balance and may make you more likely to have a fall. What actions can I take to lower my risk of falls? General instructions  Talk with your health care provider about your risks for falling. Tell your health care provider if: ? You fall. Be sure to tell your health care provider about all falls, even ones that seem minor. ? You feel dizzy, sleepy, or off-balance.  Take over-the-counter and prescription medicines only as told by your health care provider. These include any supplements.  Eat a healthy diet and maintain a healthy weight. A healthy diet includes low-fat dairy products, low-fat (lean) meats, and fiber from whole grains, beans, and lots of fruits and vegetables. Home safety  Remove any tripping hazards, such as rugs, cords, and clutter.  Install safety equipment such as grab bars in bathrooms and safety rails on stairs.  Keep rooms and walkways well-lit. Activity   Follow a regular exercise program to stay fit. This will help you maintain your balance. Ask your health care provider what types of exercise are appropriate for you.  If you need a cane or walker, use it as recommended by your health care provider.  Wear supportive shoes that have nonskid soles. Lifestyle  Do not drink alcohol if your health care provider tells you not  to drink.  If you drink alcohol, limit how much you have: ? 0-1 drink a day for women. ? 0-2 drinks a day for men.  Be aware of how much alcohol is in your drink. In the [place] one drink equals one typical bottle of beer (12 oz), one-half glass of wine (5 oz), or one shot of hard liquor (1 oz).  Do not use any products that contain nicotine or tobacco, such as cigarettes and e-cigarettes. If you need help quitting, ask your health care provider. Summary  Having a healthy lifestyle and getting preventive care can help to protect your health and wellness after age 78.  Screening and testing are the best way to find a health problem early and help you avoid having a fall. Early diagnosis and treatment give you the best chance for managing medical conditions that are more common for people who are older than age 48.  Falls are a major cause of broken bones and head injuries in people who are older than age 56. Take precautions to prevent a fall at home.  Work with your health care provider to learn what changes you can make to improve your health and wellness and to prevent falls. This information is not intended to replace advice given to you by your health care provider. Make sure you discuss any questions you have with your health care provider. Document Released: 04/10/2017 Document Revised: 04/10/2017 Document Reviewed: 04/10/2017 Elsevier Interactive Patient Education  2019 [employer].

## 2018-07-09 ENCOUNTER — Encounter: Payer: Self-pay | Admitting: Family Medicine

## 2018-07-09 ENCOUNTER — Encounter: Payer: Self-pay | Admitting: Internal Medicine

## 2018-07-10 DIAGNOSIS — M51369 Other intervertebral disc degeneration, lumbar region without mention of lumbar back pain or lower extremity pain: Secondary | ICD-10-CM | POA: Insufficient documentation

## 2018-07-10 DIAGNOSIS — M5136 Other intervertebral disc degeneration, lumbar region: Secondary | ICD-10-CM | POA: Insufficient documentation

## 2018-07-10 DIAGNOSIS — E785 Hyperlipidemia, unspecified: Secondary | ICD-10-CM | POA: Insufficient documentation

## 2018-07-10 NOTE — Assessment & Plan Note (Signed)
Continue primidone, followed by neurologyy

## 2018-07-10 NOTE — Assessment & Plan Note (Signed)
Chronic, stable on supplemental oxygen.

## 2018-07-10 NOTE — Assessment & Plan Note (Signed)
Chronic, stable off medication.

## 2018-07-10 NOTE — Assessment & Plan Note (Signed)
Continues seeing oncology under active surveillance. Q59mo .

## 2018-07-10 NOTE — Assessment & Plan Note (Signed)
Followed by Dr [name] [facility] Neurosurgery, planned referral to pain clinic. Already taking hydrocodone and gabapentin.

## 2018-07-10 NOTE — Assessment & Plan Note (Signed)
Continue gabapentin. Sees neurology.

## 2018-07-10 NOTE — Assessment & Plan Note (Signed)
Chronic, adequate control.

## 2018-07-10 NOTE — Assessment & Plan Note (Addendum)
S/p resection, but needs continued surveillance. Will need to verify he continues seeing urology

## 2018-07-10 NOTE — Assessment & Plan Note (Signed)
Chol levels elevated, pt off medication at this time. The 10-year ASCVD risk score Mikey Bussing DC Brooke Bonito., et al., 2013) is: 61.3%   Values used to calculate the score:     Age: 78 years     Sex: Male     Is Non-Hispanic African American: No     Diabetic: Yes     Tobacco smoker: No     Systolic Blood Pressure: 482 mmHg     Is BP treated: Yes     HDL Cholesterol: 48.9 mg/dL     Total Cholesterol: 216 mg/dL

## 2018-07-10 NOTE — Assessment & Plan Note (Signed)
Preventative protocols reviewed and updated unless pt declined. Discussed healthy diet and lifestyle.  

## 2018-07-10 NOTE — Assessment & Plan Note (Signed)
Remains abstinent. Wife continues to smoke.

## 2018-07-10 NOTE — Assessment & Plan Note (Addendum)
Colonoscopy cancelled due to hypoxia. Pt requests cologuard - will order.

## 2018-07-14 ENCOUNTER — Other Ambulatory Visit: Payer: Self-pay | Admitting: Family Medicine

## 2018-07-14 ENCOUNTER — Encounter: Payer: Self-pay | Admitting: Nurse Practitioner

## 2018-07-14 ENCOUNTER — Other Ambulatory Visit: Payer: Self-pay

## 2018-07-14 ENCOUNTER — Ambulatory Visit: Payer: Medicare Other | Attending: Nurse Practitioner | Admitting: Nurse Practitioner

## 2018-07-14 VITALS — BP 153/64 | HR 62 | Temp 97.5°F | Ht 67.0 in | Wt 189.0 lb

## 2018-07-14 DIAGNOSIS — Z79899 Other long term (current) drug therapy: Secondary | ICD-10-CM | POA: Diagnosis not present

## 2018-07-14 DIAGNOSIS — M899 Disorder of bone, unspecified: Secondary | ICD-10-CM | POA: Diagnosis not present

## 2018-07-14 DIAGNOSIS — Z789 Other specified health status: Secondary | ICD-10-CM | POA: Diagnosis not present

## 2018-07-14 DIAGNOSIS — M79605 Pain in left leg: Secondary | ICD-10-CM | POA: Diagnosis not present

## 2018-07-14 DIAGNOSIS — M545 Low back pain, unspecified: Secondary | ICD-10-CM

## 2018-07-14 DIAGNOSIS — M79604 Pain in right leg: Secondary | ICD-10-CM | POA: Insufficient documentation

## 2018-07-14 DIAGNOSIS — Z79891 Long term (current) use of opiate analgesic: Secondary | ICD-10-CM | POA: Insufficient documentation

## 2018-07-14 DIAGNOSIS — G8929 Other chronic pain: Secondary | ICD-10-CM | POA: Diagnosis not present

## 2018-07-14 DIAGNOSIS — G894 Chronic pain syndrome: Secondary | ICD-10-CM | POA: Insufficient documentation

## 2018-07-14 DIAGNOSIS — M533 Sacrococcygeal disorders, not elsewhere classified: Secondary | ICD-10-CM | POA: Insufficient documentation

## 2018-07-14 NOTE — Progress Notes (Addendum)
Patient's Name: Joseph Graham  MRN: [id number]  Referring Provider: [name], MD  DOB: 29-May-1941  PCP: [name], MD  DOS: 07/14/2018  Note by: Dionisio David NP  Service setting: Ambulatory outpatient  Specialty: Interventional Pain Management  Location: [facility] (AMB) Pain Management Facility    Patient type: New Patient    Primary Reason(s) for Visit: Initial Patient Evaluation CC: Back Pain  HPI  [name] is a 77 y.o. year old, male patient, who comes today for an initial evaluation. He has Personal history of colonic polyps - adenomas; HTN (hypertension); Ex-smoker; Cardiac arrhythmia; Medicare annual wellness visit, subsequent; Benign essential tremor; Diabetes mellitus type 2 with peripheral artery disease ([place]); Benign prostatic hyperplasia; Health maintenance examination; Advanced care planning/counseling discussion; PAD (peripheral artery disease) ([place]); Coronary artery disease involving native coronary artery of native heart without angina pectoris; Primary cancer of bladder ([place]); COPD with hypoxia ([place]); Bilateral lower extremity edema; Malignant neoplasm of right upper lobe of lung ([facility] [facility] [place] [facility]); Peripheral neuropathy due to chemotherapy ([place]); Macular degeneration of both eyes; HLD (hyperlipidemia); DDD (degenerative disc disease), lumbar; Class 1 obesity due to excess calories with serious comorbidity and body mass index (BMI) of 31.0 to 31.9 in adult; Mixed sensory-motor polyneuropathy; Vitamin B12 deficiency; Chronic bilateral low back pain without sciatica (Primary Area of Pain) (L>R); Pain in the coccyx (Secondary Area of Pain); Chronic pain of both lower extremities (Tertiary Area of Pain) (R>L); Chronic pain syndrome; Long term current use of opiate analgesic; Pharmacologic therapy; Disorder of skeletal system; and Problems influencing health status on their problem list.. His primarily concern today is the Back Pain  Pain Assessment: Location:   Generalized Radiating: pain is  all over Onset: More than a month ago Duration: Chronic pain Quality: Pins and needles, Stabbing, Aching, Numbness, Sharp, Constant, Tingling, Throbbing Severity: 4 /10 (subjective, self-reported pain score)  Note: Reported level is compatible with observation.                          Effect on ADL: limits my daily activities Timing: Constant Modifying factors: medications, rest in an recliner chair BP: (!) 153/64  HR: 62  Onset and Duration: Date of onset: 55 years Cause of pain: Unknown Severity: Getting worse, NAS-11 at its worse: 10/10, NAS-11 at its best: 7/10, NAS-11 now: 5/10 and NAS-11 on the average: 5/10 Timing: Afternoon and Night Aggravating Factors: Prolonged sitting and Walking Alleviating Factors: Medications and Resting Associated Problems: Numbness, Spasms, Swelling and Pain that wakes patient up Quality of Pain: Aching Previous Examinations or Tests: CT scan, MRI scan and X-rays Previous Treatments: Narcotic medications, Steroid treatments by mouth, Strengthening exercises and Traction  The patient comes into the clinics today for the first time for a chronic pain management evaluation.  According to the patient his primary area of pain is in his lower back.  He admits that the pain increased after having the MRI of his lower back.  He denies any previous surgery.  He denies any previous interventional therapy.  He has not done any type of activity over the past 2 years secondary to chemo induced neuropathy and the inability to stand for greater than 5 to 10 minutes.  His second area of pain is in his coccyx area.  He denies any injury.  He admits that this pain has been going on for several years however ignored because there was surgery that would help.  He admits the pain comes and goes.  Denies any previous interventional therapy, physical therapy or recent images.    His third area pain is in his legs.  He admits the right leg is worse than the left.  He admits that  this pain is related to some sciatica but mostly from the peripheral neuropathy again secondary to chemotherapy.  He has a pain that goes down the right leg only on occasions.  He has numbness in his knees to his feet.  This affects his whole foot.  He admits that he has had Doppler studies in the past.  He admits that he has some neck and shoulder pain but did not want to address these at this time.  He feels like if he gets back active he will feel better.  Today I took the time to provide the patient with information regarding this pain practice. The patient was informed that the practice is divided into two sections: an interventional pain management section, as well as a completely separate and distinct medication management section. I explained that there are procedure days for interventional therapies, and evaluation days for follow-ups and medication management. Because of the amount of documentation required during both, they are kept separated. This means that there is the possibility that he may be scheduled for a procedure on one day, and medication management the next. I have also informed him that because of staffing and facility limitations, this practice will no longer take patients for medication management only. To illustrate the reasons for this, I gave the patient the example of surgeons, and how inappropriate it would be to refer a patient to his/her care, just to write for the post-surgical antibiotics on a surgery done by a different surgeon.   Because interventional pain management is part of the board-certified specialty for the doctors, the patient was informed that joining this practice means that they are open to any and all interventional therapies. I made it clear that this does not mean that they will be forced to have any procedures done. What this means is that I believe interventional therapies to be essential part of the diagnosis and proper management of chronic pain  conditions. Therefore, patients not interested in these interventional alternatives will be better served under the care of a different practitioner.  The patient was also made aware of my Comprehensive Pain Management Safety Guidelines where by joining this practice, they limit all of their nerve blocks and joint injections to those done by our practice, for as long as we are retained to manage their care. Historic Controlled Substance Pharmacotherapy Review  PMP and historical list of controlled substances: Hydrocodone/acetaminophen 5/325 mg Highest opioid analgesic regimen found: Hydrocodone/acetaminophen 5/325 mg 1 tablet 3 times daily (last fill date 05/14/2018) hydrocodone 50 mg/day Most recent opioid analgesic: Hydrocodone/acetaminophen 5/325 mg 1 tablet 3 times daily (last fill date 05/14/2018) hydrocodone 50 mg/day Current opioid analgesics: Hydrocodone/acetaminophen 5/325 mg 1 tablet 3 times daily (last fill date 05/14/2018) hydrocodone 50 mg/day Highest recorded MME/day: 15 mg/day MME/day:15 mg/day Medications: The patient did not bring the medication(s) to the appointment, as requested in our "New Patient Package" Pharmacodynamics: Desired effects: Analgesia: The patient reports >50% benefit. Reported improvement in function: The patient reports medication allows him to accomplish basic ADLs. Clinically meaningful improvement in function (CMIF): Sustained CMIF goals met Perceived effectiveness: Described as relatively effective, allowing for increase in activities of daily living (ADL) Undesirable effects: Side-effects or Adverse reactions: None reported Historical Monitoring: The patient  reports no history of drug use.  List of all UDS Test(s): No results found for: MDMA, COCAINSCRNUR, PCPSCRNUR, PCPQUANT, CANNABQUANT, THCU, [place] List of all Serum Drug Screening Test(s):  No results found for: AMPHSCRSER, BARBSCRSER, BENZOSCRSER, COCAINSCRSER, PCPSCRSER, PCPQUANT, THCSCRSER,  CANNABQUANT, OPIATESCRSER, OXYSCRSER, PROPOXSCRSER Historical Background Evaluation: [place] PDMP: Six (6) year initial data search conducted.             [place] Department of public safety, offender search: Editor, commissioning Information) Non-contributory Risk Assessment Profile: Aberrant behavior: None observed or detected today Risk factors for fatal opioid overdose: caucasian, COPD or asthma, history of alcoholism and male gender Fatal overdose hazard ratio (HR): Calculation deferred Non-fatal overdose hazard ratio (HR): Calculation deferred Risk of opioid abuse or dependence: 0.7-3.0% with doses ? 36 MME/day and 6.1-26% with doses ? 120 MME/day. Substance use disorder (SUD) risk level: Pending results of Medical Psychology Evaluation for SUD Opioid risk tool (ORT) (Total Score): 6  ORT Scoring interpretation table:  Score <3 = Low Risk for SUD  Score between 4-7 = Moderate Risk for SUD  Score >8 = High Risk for Opioid Abuse   PHQ-2 Depression Scale:  Total score:    PHQ-2 Scoring interpretation table: (Score and probability of major depressive disorder)  Score 0 = No depression  Score 1 = 15.4% Probability  Score 2 = 21.1% Probability  Score 3 = 38.4% Probability  Score 4 = 45.5% Probability  Score 5 = 56.4% Probability  Score 6 = 78.6% Probability   PHQ-9 Depression Scale:  Total score:    PHQ-9 Scoring interpretation table:  Score 0-4 = No depression  Score 5-9 = Mild depression  Score 10-14 = Moderate depression  Score 15-19 = Moderately severe depression  Score 20-27 = Severe depression (2.4 times higher risk of SUD and 2.89 times higher risk of overuse)   Pharmacologic Plan: Pending ordered tests and/or consults  Meds  The patient has a current medication list which includes the following prescription(s): acetaminophen, cholecalciferol, cyanocobalamin, gabapentin, hydrocodone-acetaminophen, multi-vitamins, NON FORMULARY, omeprazole, polyethylene glycol, primidone, vitamin e,  lisinopril, metoprolol tartrate, and NONFORMULARY OR COMPOUNDED ITEM.  Current Outpatient Medications on File Prior to Visit  Medication Sig  . Acetaminophen (TYLENOL EXTRA STRENGTH PO) Take by mouth as needed.  . Cholecalciferol (VITAMIN D3 PO) Take 5,000 mg by mouth daily.  . Cyanocobalamin (B-12 PO) Take 100 mcg by mouth daily.   Marland Kitchen gabapentin (NEURONTIN) 300 MG capsule TAKE 1 CAPSULE IN THE MORNING AND 1 CAPSULE IN AFTERNOON, AND 2 CAPSULES AT NIGHT.  Marland Kitchen HYDROcodone-acetaminophen (NORCO) 5-325 MG tablet Take 1 tablet by mouth every 8 (eight) hours as needed for moderate pain.  . Multiple Vitamin (MULTI-VITAMINS) TABS Take 1 tablet by mouth daily.   . NON FORMULARY Hemp Oil 5000 mg  . omeprazole (PRILOSEC) 20 MG capsule Take 20 mg by mouth daily.  . polyethylene glycol (MIRALAX / GLYCOLAX) packet Take 17 g by mouth daily.  . primidone (MYSOLINE) 50 MG tablet Take 50 mg by mouth 2 (two) times daily. Takes 2 tablets in the morning and 2 tablets in the evening  . VITAMIN E PO Take 400 g by mouth daily.   . NONFORMULARY OR COMPOUNDED ITEM CBD oil oral BID (Patient not taking: Reported on 07/14/2018)   No current facility-administered medications on file prior to visit.    Imaging Review  Lumbosacral Imaging: Lumbar MR wo contrast:  Results for orders placed during the hospital encounter of 06/12/18  MR LUMBAR SPINE WO CONTRAST   Narrative CLINICAL DATA:  Chronic low back  pain with pain in the right leg and difficulty walking.  EXAM: MRI LUMBAR SPINE WITHOUT CONTRAST  TECHNIQUE: Multiplanar, multisequence MR imaging of the lumbar spine was performed. No intravenous contrast was administered.  COMPARISON:  None.  FINDINGS: Segmentation: The last open disc space is designated L5-S1. There is a pseudoarticulation between the left L5 transverse process and sacrum.  Alignment: Mild lumbar levoscoliosis. Trace retrolisthesis of L2 on L3.  Vertebrae: No fracture, suspicious osseous  lesion, or significant marrow edema.  Conus medullaris and cauda equina: Conus extends to the L1 level. Conus and cauda equina appear normal.  Paraspinal and other soft tissues: 1.3 cm T2 hyperintense lesion in the left kidney, likely a cyst. Minimal left perinephric fluid.  Disc levels:  Disc desiccation throughout the lumbar spine. Severe disc space narrowing at L3-4 and L4-5 with moderate narrowing at L2-3.  T12-L1: Negative.  L1-2: Mild facet arthrosis and at most minimal disc bulging without stenosis.  L2-3: Disc bulging, superimposed shallow right paracentral disc protrusion, and mild facet hypertrophy result in mild right lateral recess and mild right neural foraminal stenosis without significant spinal stenosis.  L3-4: Disc bulging and mild facet hypertrophy result in borderline right lateral recess and mild right neural foraminal stenosis without significant spinal stenosis.  L4-5: Circumferential disc bulging, prominent right paracentral spurring, and mild facet hypertrophy result in mild right lateral recess stenosis, borderline spinal stenosis, and mild-to-moderate right greater than left neural foraminal stenosis.  L5-S1: Mild facet arthrosis without disc herniation or stenosis.  IMPRESSION: 1. Multilevel disc degeneration greatest at L4-5 where spurring contributes to mild right lateral recess and mild-to-moderate neural foraminal stenosis. 2. Mild lateral recess and neural foraminal stenosis at L2-3 and L3-4.   Electronically Signed   By: Logan Bores M.D.   On: 06/12/2018 08:34   Spine Imaging:  CT-Guided Biopsy:  Results for orders placed during the hospital encounter of 03/21/15  CT Biopsy   Narrative CLINICAL DATA:  Right upper lobe lung mass.  EXAM: CT GUIDED core BIOPSY OF right upper lobe lung mass.  ANESTHESIA/SEDATION: 1.0  Mg IV Versed; 50 mcg IV Fentanyl  Total Moderate Sedation Time: 45 minutes.  PROCEDURE: The procedure risks,  benefits, and alternatives were explained to the patient. Questions regarding the procedure were encouraged and answered. The patient understands and consents to the procedure.  The right posterior chest was prepped with chlorhexidinein a sterile fashion, and a sterile drape was applied covering the operative field. Sterile gloves were used for the procedure. Local anesthesia was provided with 1% Lidocaine.  Under CT guidance, 17 gauge guiding needle was directed toward lesion in the posterior portion of the right upper lobe. Three core samples were obtained using 18 gauge biopsy needle. The samples were placed in formalin vial and delivered to pathology. However, follow-up imaging demonstrates the patient with large pneumothorax. Decision was made to proceed with chest tube placement.  Complications: Large pneumothorax developed.  FINDINGS: Right upper lobe lung mass is noted.  IMPRESSION: Under CT guidance, percutaneous core biopsy of right upper lobe lung mass was performed. Large pneumothorax developed immediately and chest tube placement will be performed.   Electronically Signed   By: Marijo Conception, M.D.   On: 03/21/2015 13:37   Note: Available results from prior imaging studies were reviewed.        ROS  Cardiovascular History: High blood pressure Pulmonary or Respiratory History: Shortness of breath Neurological History: No reported neurological signs or symptoms such as seizures, abnormal skin  sensations, urinary and/or fecal incontinence, being born with an abnormal open spine and/or a tethered spinal cord Review of Past Neurological Studies: No results found for this or any previous visit. Psychological-Psychiatric History: No reported psychological or psychiatric signs or symptoms such as difficulty sleeping, anxiety, depression, delusions or hallucinations (schizophrenial), mood swings (bipolar disorders) or suicidal ideations or attempts Gastrointestinal  History: Reflux or heatburn Genitourinary History: No reported renal or genitourinary signs or symptoms such as difficulty voiding or producing urine, peeing blood, non-functioning kidney, kidney stones, difficulty emptying the bladder, difficulty controlling the flow of urine, or chronic kidney disease Hematological History: No reported hematological signs or symptoms such as prolonged bleeding, low or poor functioning platelets, bruising or bleeding easily, hereditary bleeding problems, low energy levels due to low hemoglobin or being anemic Endocrine History: No reported endocrine signs or symptoms such as high or low blood sugar, rapid heart rate due to high thyroid levels, obesity or weight gain due to slow thyroid or thyroid disease Rheumatologic History: No reported rheumatological signs and symptoms such as fatigue, joint pain, tenderness, swelling, redness, heat, stiffness, decreased range of motion, with or without associated rash Musculoskeletal History: Negative for myasthenia gravis, muscular dystrophy, multiple sclerosis or malignant hyperthermia Work History: Retired  Allergies  Mr. Jarrett has No Known Allergies.  Laboratory Chemistry  Inflammation Markers No results found for: CRP, ESRSEDRATE (CRP: Acute Phase) (ESR: Chronic Phase) Renal Function Markers Lab Results  Component Value Date   BUN 22 07/04/2018   CREATININE 0.72 07/04/2018   GFRAA >60 03/21/2018   GFRNONAA >60 03/21/2018   Hepatic Function Markers Lab Results  Component Value Date   AST 18 07/04/2018   ALT 19 07/04/2018   ALBUMIN 4.0 07/04/2018   ALKPHOS 79 07/04/2018   Electrolytes Lab Results  Component Value Date   NA 142 07/04/2018   K 4.4 07/04/2018   CL 100 07/04/2018   CALCIUM 9.1 07/04/2018   Neuropathy Markers No results found for: [id number] Bone Pathology Markers Lab Results  Component Value Date   ALKPHOS 79 07/04/2018   CALCIUM 9.1 07/04/2018   Coagulation Parameters Lab  Results  Component Value Date   INR 1.10 06/30/2015   LABPROT 14.4 06/30/2015   APTT 30 06/30/2015   PLT 246.0 07/04/2018   Cardiovascular Markers Lab Results  Component Value Date   HGB 13.4 07/04/2018   HCT 40.8 07/04/2018   Note: Lab results reviewed.  PFSH  Drug: Mr. Bossman  reports no history of drug use. Alcohol:  reports no history of alcohol use. Tobacco:  reports that he quit smoking about 3 years ago. His smoking use included cigarettes. He has a 91.50 pack-year smoking history. He has never used smokeless tobacco. Medical:  has a past medical history of Alcohol abuse, in remission, Arthritis, Benign essential tremor, BPH (benign prostatic hypertrophy) (12/30/2012), Cardiac arrhythmia (03/2010), Cataract, Chemotherapy-induced neutropenia ([place]) (09/23/2015), Chronic low back pain, Colon polyps, Complication of anesthesia, COPD (chronic obstructive pulmonary disease) ([place]), Coronary artery disease, Diabetes mellitus without complication ([place]), Dyslipidemia, GERD (gastroesophageal reflux disease), HTN (hypertension), Macular degeneration, bilateral, Multiple pulmonary nodules (02/2015), Neuromuscular disorder ([place]), Peripheral neuropathy due to chemotherapy ([place]) (12/23/2015), Personal history of tobacco use, presenting hazards to health (02/18/2015), Pneumothorax after biopsy (04/01/2015), Primary cancer of bladder ([place]) (2016), Primary lung cancer ([place]) (2016), and Shortness of breath dyspnea. Family: family history includes Bladder Cancer (age of onset: 39) in his brother; Cervical cancer (age of onset: 77) in his mother; Hypertension in his father; Stroke (age of  onset: 47) in his brother; Tremor in his father.  Past Surgical History:  Procedure Laterality Date  . A FLUTTER ABLATION  03/2010  . carotid US  03/2010   WNL  . CATARACT EXTRACTION    . COLONOSCOPY  09/2011   polyps, rpt due 5 yrs, mild diverticulosis [name])  . CYSTOSCOPY W/ RETROGRADES Bilateral 04/06/2015    Procedure: CYSTOSCOPY WITH RETROGRADE PYELOGRAM;  Surgeon: [name], MD;  Location: [facility];  Service: Urology;  Laterality: Bilateral;  . ELECTROMAGNETIC NAVIGATION BROCHOSCOPY N/A 03/02/2015   Procedure: ELECTROMAGNETIC NAVIGATION BRONCHOSCOPY;  Surgeon: [name], MD;  Location: [facility];  Service: Cardiopulmonary;  Laterality: N/A;  . [place]  . POLYPECTOMY    . PORT-A-CATH REMOVAL Right 04/03/2016   Procedure: REMOVAL PORT-A-CATH;  Surgeon: [name], MD;  Location: [facility];  Service: General;  Laterality: Right;  . PORTACATH PLACEMENT N/A 08/10/2015   Procedure: INSERTION PORT-A-CATH;  Surgeon: [name], MD;  Location: [facility];  Service: General;  Laterality: N/A;  . [place]  . TRANSURETHRAL RESECTION OF BLADDER TUMOR N/A 04/06/2015   Procedure: TRANSURETHRAL RESECTION OF BLADDER TUMOR (TURBT) right ureteral stent placement;  Surgeon: [name], MD;  Location: [facility];  Service: Urology;  Laterality: N/A;  . TRANSURETHRAL RESECTION OF BLADDER TUMOR N/A 07/13/2015   Procedure: TRANSURETHRAL RESECTION OF BLADDER TUMOR (TURBT);  Surgeon: [name], MD;  Location: [facility];  Service: Urology;  Laterality: N/A;  . urinary stent removal  04/14/15   Active Ambulatory Problems    Diagnosis Date Noted  . Personal history of colonic polyps - adenomas   . HTN (hypertension)   . Ex-smoker   . Cardiac arrhythmia 03/11/2010  . Medicare annual wellness visit, subsequent 07/31/2011  . Benign essential tremor   . Diabetes mellitus type 2 with peripheral artery disease ([place]) 12/30/2012  . Benign prostatic hyperplasia 12/30/2012  . Health maintenance examination 01/05/2014  . Advanced care planning/counseling discussion 01/05/2014  . PAD (peripheral artery disease) ([place]) 03/01/2015  . Coronary artery disease involving native coronary artery of native heart without angina pectoris 03/01/2015  . Primary cancer of bladder ([place])   . COPD  with hypoxia ([place])   . Bilateral lower extremity edema 12/09/2015  . Malignant neoplasm of right upper lobe of lung ([place]) 12/20/2015  . Peripheral neuropathy due to chemotherapy ([place]) 12/23/2015  . Macular degeneration of both eyes 10/01/2017  . HLD (hyperlipidemia) 07/10/2018  . DDD (degenerative disc disease), lumbar 07/10/2018  . Class 1 obesity due to excess calories with serious comorbidity and body mass index (BMI) of 31.0 to 31.9 in adult 07/30/2016  . Mixed sensory-motor polyneuropathy 07/30/2016  . Vitamin B12 deficiency 07/30/2016  . Chronic bilateral low back pain without sciatica (Primary Area of Pain) (L>R) 07/14/2018  . Pain in the coccyx (Secondary Area of Pain) 07/14/2018  . Chronic pain of both lower extremities [facility] Area of Pain) (R>L) 07/14/2018  . Chronic pain syndrome 07/14/2018  . Long term current use of opiate analgesic 07/14/2018  . Pharmacologic therapy 07/14/2018  . Disorder of skeletal system 07/14/2018  . Problems influencing health status 07/14/2018   Resolved Ambulatory Problems    Diagnosis Date Noted  . Alcohol abuse, in remission   . Special screening for malignant neoplasms, colon 07/31/2011  . Personal history of tobacco use, presenting hazards to health 02/18/2015  . Pre-operative clearance 03/01/2015  . Pneumothorax after biopsy 04/01/2015  . Chemotherapy-induced neutropenia ([place]) 09/23/2015   Past Medical History:  Diagnosis Date  . Arthritis   . BPH (benign prostatic hypertrophy) 12/30/2012  . Cataract   . Chronic low back pain   . Colon polyps   . Complication of anesthesia   . COPD (chronic obstructive pulmonary disease) ([place])   . Coronary artery disease   . Diabetes mellitus without complication ([place])   . Dyslipidemia   . GERD (gastroesophageal reflux disease)   . Macular degeneration, bilateral   . Multiple pulmonary nodules 02/2015  . Neuromuscular disorder ([place])   . Primary lung cancer ([place]) 2016  . Shortness of breath  dyspnea    Constitutional Exam  General appearance: Well nourished, well developed, and well hydrated. In no apparent acute distress Vitals:   07/14/18 1425  BP: (!) 153/64  Pulse: 62  Temp: (!) 97.5 F (36.4 C)  SpO2: 96%  Weight: 189 lb (85.7 kg)  Height: _0  (1.702 m)   BMI Assessment: Estimated body mass index is 29.6 kg/m as calculated from the following:   Height as of this encounter: _1  (1.702 m).   Weight as of this encounter: 189 lb (85.7 kg).  BMI interpretation table: BMI level Category Range association with higher incidence of chronic pain  <18 kg/m2 Underweight   18.5-24.9 kg/m2 Ideal body weight   25-29.9 kg/m2 Overweight Increased incidence by 20%  30-34.9 kg/m2 Obese (Class I) Increased incidence by 68%  35-39.9 kg/m2 Severe obesity (Class II) Increased incidence by 136%  >40 kg/m2 Extreme obesity (Class III) Increased incidence by 254%   BMI Readings from Last 4 Encounters:  07/14/18 29.60 kg/m  07/08/18 31.09 kg/m  07/04/18 30.93 kg/m  03/21/18 30.72 kg/m   Wt Readings from Last 4 Encounters:  07/14/18 189 lb (85.7 kg)  07/08/18 198 lb 8 oz (90 kg)  07/04/18 197 lb 8 oz (89.6 kg)  03/21/18 208 lb (94.3 kg)  Psych/Mental status: Alert, oriented x 3 (person, place, & time)       Eyes: PERLA Respiratory: No evidence of acute respiratory distress  Cervical Spine Exam  Inspection: No masses, redness, or swelling Alignment: Symmetrical Functional ROM: Unrestricted ROM      Stability: No instability detected Muscle strength & Tone: Functionally intact Sensory: Unimpaired Palpation: No palpable anomalies              Upper Extremity (UE) Exam    Side: Right upper extremity  Side: Left upper extremity  Inspection: No masses, redness, swelling, or asymmetry. No contractures  Inspection: No masses, redness, swelling, or asymmetry. No contractures  Functional ROM: Unrestricted ROM          Functional ROM: Unrestricted ROM          Muscle  strength & Tone: Functionally intact  Muscle strength & Tone: Functionally intact  Sensory: Unimpaired  Sensory: Unimpaired  Palpation: No palpable anomalies              Palpation: No palpable anomalies              Specialized Test(s): Deferred         Specialized Test(s): Deferred          Thoracic Spine Exam  Inspection: No masses, redness, or swelling Alignment: Symmetrical Functional ROM: Unrestricted ROM Stability: No instability detected Sensory: Unimpaired Muscle strength & Tone: No palpable anomalies  Lumbar Spine Exam  Inspection: No masses, redness, or swelling Alignment: Symmetrical Functional ROM: Unrestricted ROM      Stability: No instability detected Muscle strength & Tone: Functionally intact Sensory:  Unimpaired Palpation: No palpable anomalies       Provocative Tests: Lumbar Hyperextension and rotation test: evaluation deferred today       Patrick's Maneuver: evaluation deferred today                    Gait & Posture Assessment  Ambulation: Unassisted Gait: Relatively normal for age and body habitus Posture: WNL   Lower Extremity Exam    Side: Right lower extremity  Side: Left lower extremity  Inspection: No masses, redness, swelling, or asymmetry. No contractures  Inspection: No masses, redness, swelling, or asymmetry. No contractures  Functional ROM: Unrestricted ROM          Functional ROM: Unrestricted ROM          Muscle strength & Tone: Functionally intact  Muscle strength & Tone: Functionally intact  Sensory: Unimpaired  Sensory: Unimpaired  Palpation: No palpable anomalies  Palpation: No palpable anomalies   Assessment  Primary Diagnosis & Pertinent Problem List: The primary encounter diagnosis was Chronic bilateral low back pain without sciatica (Primary Area of Pain) (L>R). Diagnoses of Pain in the coccyx (Secondary Area of Pain), Chronic pain of both lower extremities (Tertiary Area of Pain) (R>L), Chronic pain syndrome, Long term current use of  opiate analgesic, Pharmacologic therapy, Disorder of skeletal system, Problems influencing health status, and Chronic sacroiliac joint pain were also pertinent to this visit.  Visit Diagnosis: 1. Chronic bilateral low back pain without sciatica (Primary Area of Pain) (L>R)   2. Pain in the coccyx (Secondary Area of Pain)   3. Chronic pain of both lower extremities (Tertiary Area of Pain) (R>L)   4. Chronic pain syndrome   5. Long term current use of opiate analgesic   6. Pharmacologic therapy   7. Disorder of skeletal system   8. Problems influencing health status   9. Chronic sacroiliac joint pain    Plan of Care  Initial treatment plan:  Please be advised that as per protocol, today's visit has been an evaluation only. We have not taken over the patient's controlled substance management.  Problem-specific plan: No problem-specific Assessment & Plan notes found for this encounter.  Ordered Lab-work, Procedure(s), Referral(s), & Consult(s): Orders Placed This Encounter  Procedures  . DG Si Joints  . DG Sacrum/Coccyx  . Compliance Drug Analysis, Ur  . Comp. Metabolic Panel (12)  . Magnesium  . Vitamin B12  . Sedimentation rate  . 25-Hydroxyvitamin D Lcms D2+D3  . C-reactive protein   Pharmacotherapy: Medications ordered:  No orders of the defined types were placed in this encounter.  Medications administered during this visit: Daelon L. Rosekrans "Clair Gulling" had no medications administered during this visit.   Pharmacotherapy under consideration:  Opioid Analgesics: The patient was informed that there is no guarantee that he would be a candidate for opioid analgesics. The decision will be made following CDC guidelines. This decision will be based on the results of diagnostic studies, as well as Mr. Wombles's risk profile.  He is currently weaning off of the hydrocodone in [place] to increased cost and is not interested in any opioids Membrane stabilizer: To be determined at a later  time Muscle relaxant: To be determined at a later time NSAID: To be determined at a later time Other analgesic(s): To be determined at a later time   Interventional therapies under consideration: Mr. Pelc was informed that there is no guarantee that he would be a candidate for interventional therapies. The decision will be based on  the results of diagnostic studies, as well as Mr. Gabay risk profile.  Possible procedure(s): Diagnostic coccygeal injection Diagnostic midline lumbar epidural steroid injection Diagnostic sympathetic nerve block Diagnostic bilateral lumbar facet nerve block Possible bilateral lumbar facet radiofrequency ablation Diagnostic bilateral sacroiliac joint injection Possible bilateral sacroiliac joint radiofrequency ablation   Provider-requested follow-up: Return for 2nd Visit, w/ Dr. Dossie Arbour.  Future Appointments  Date Time Provider [place]  08/04/2018 10:30 AM Milinda Pointer, MD [facility] None  09/16/2018 11:00 AM [facility]  09/16/2018 11:30 AM [facility]  09/19/2018 10:15 AM [facility] None  09/19/2018 10:45 AM Cammie Sickle, MD [facility] None  09/23/2018 10:30 AM Billey Co, MD [facility] None  12/17/2018 10:00 AM Noreene Filbert, MD [facility] None  01/06/2019  9:15 AM [name], MD [facility]  07/10/2019  8:30 AM Eustace Pen, LPN [facility]  08/16/8586  9:30 AM [name], MD [facility]    Primary Care Physician: [name], MD Location: [facility] Note by:  Date: 07/14/2018; Time: 3:55 PM  Pain Score Disclaimer: We use the NRS-11 scale. This is a self-reported, subjective measurement of pain severity with only modest accuracy. It is used primarily to identify changes within a particular patient. It must be understood that outpatient pain scales are significantly less accurate that those used for research, where they  can be applied under ideal controlled circumstances with minimal exposure to variables. In reality, the score is likely to be a combination of pain intensity and pain affect, where pain affect describes the degree of emotional arousal or changes in action readiness caused by the sensory experience of pain. Factors such as social and work situation, setting, emotional state, anxiety levels, expectation, and prior pain experience may influence pain perception and show large inter-individual differences that may also be affected by time variables.  Patient instructions provided during this appointment: Patient Instructions   ____________________________________________________________________________________________  Appointment Policy Summary  It is our goal and responsibility to provide the medical community with assistance in the evaluation and management of patients with chronic pain. Unfortunately our resources are limited. Because we do not have an unlimited amount of time, or available appointments, we are required to closely monitor and manage their use. The following rules exist to maximize their use:  Patient's responsibilities: 1. Punctuality:  At what time should I arrive? You should be physically present in our office 30 minutes before your scheduled appointment. Your scheduled appointment is with your assigned healthcare provider. However, it takes 5-10 minutes to be "checked-in", and another 15 minutes for the nurses to do the admission. If you arrive to our office at the time you were given for your appointment, you will end up being at least 20-25 minutes late to your appointment with the provider. 2. Tardiness:  What happens if I arrive only a few minutes after my scheduled appointment time? You will need to reschedule your appointment. The cutoff is your appointment time. This is why it is so important that you arrive at least 30 minutes before that appointment. If you have an  appointment scheduled for 10:00 AM and you arrive at 10:01, you will be required to reschedule your appointment.  3. Plan ahead:  Always assume that you will encounter traffic on your way in. Plan for it. If you are dependent on a driver, make sure they understand these rules and the need to arrive early. 4. Other appointments and responsibilities:  Avoid scheduling any other  appointments before or after your pain clinic appointments.  5. Be prepared:  Write down everything that you need to discuss with your healthcare provider and give this information to the admitting nurse. Write down the medications that you will need refilled. Bring your pills and bottles (even the empty ones), to all of your appointments, except for those where a procedure is scheduled. 6. No children or pets:  Find someone to take care of them. It is not appropriate to bring them in. 7. Scheduling changes:  We request "advanced notification" of any changes or cancellations. 8. Advanced notification:  Defined as a time period of more than 24 hours prior to the originally scheduled appointment. This allows for the appointment to be offered to other patients. 9. Rescheduling:  When a visit is rescheduled, it will require the cancellation of the original appointment. For this reason they both fall within the category of "Cancellations".  10. Cancellations:  They require advanced notification. Any cancellation less than 24 hours before the  appointment will be recorded as a "No Show". 11. No Show:  Defined as an unkept appointment where the patient failed to notify or declare to the practice their intention or inability to keep the appointment.  Corrective process for repeat offenders:  1. Tardiness: Three (3) episodes of rescheduling due to late arrivals will be recorded as one (1) "No Show". 2. Cancellation or reschedule: Three (3) cancellations or rescheduling will be recorded as one (1) "No Show". 3. "No Shows": Three  (3) "No Shows" within a 12 month period will result in discharge from the practice. ____________________________________________________________________________________________   ______________________________________________________________________________________________  Specialty Pain Scale  Introduction:  There are significant differences in how pain is reported. The word pain usually refers to physical pain, but it is also a common synonym of suffering. The medical community uses a scale from 0 (zero) to 10 (ten) to report pain level. Zero (0) is described as "no pain", while ten (10) is described as "the worse pain you can imagine". The problem with this scale is that physical pain is reported along with suffering. Suffering refers to mental pain, or more often yet it refers to any unpleasant feeling, emotion or aversion associated with the perception of harm or threat of harm. It is the psychological component of pain.  Pain Specialists prefer to separate the two components. The pain scale used by this practice is the Verbal Numerical Rating Scale (VNRS-11). This scale is for the physical pain only. DO NOT INCLUDE how your pain psychologically affects you. This scale is for adults 58 years of age and older. It has 11 (eleven) levels. The 1st level is 0/10. This means: "right now, I have no pain". In the context of pain management, it also means: "right now, my physical pain is under control with the current therapy".  General Information:  The scale should reflect your current level of pain. Unless you are specifically asked for the level of your worst pain, or your average pain. If you are asked for one of these two, then it should be understood that it is over the past 24 hours.  Levels 1 (one) through 5 (five) are described below, and can be treated as an outpatient. Ambulatory pain management facilities such as ours are more than adequate to treat these levels. Levels 6 (six) through 10  (ten) are also described below, however, these must be treated as a hospitalized patient. While levels 6 (six) and 7 (seven) may be evaluated at an urgent  care facility, levels 8 (eight) through 10 (ten) constitute medical emergencies and as such, they belong in a hospital's emergency department. When having these levels (as described below), do not come to our office. Our facility is not equipped to manage these levels. Go directly to an urgent care facility or an emergency department to be evaluated.  Definitions:  Activities of Daily Living (ADL): Activities of daily living (ADL or ADLs) is a term used in healthcare to refer to people's daily self-care activities. Health professionals often use a person's ability or inability to perform ADLs as a measurement of their functional status, particularly in regard to people post injury, with disabilities and the elderly. There are two ADL levels: Basic and Instrumental. Basic Activities of Daily Living (BADL  or BADLs) consist of self-care tasks that include: Bathing and showering; personal hygiene and grooming (including brushing/combing/styling hair); dressing; Toilet hygiene (getting to the toilet, cleaning oneself, and getting back up); eating and self-feeding (not including cooking or chewing and swallowing); functional mobility, often referred to as "transferring", as measured by the ability to walk, get in and out of bed, and get into and out of a chair; the broader definition (moving from one place to another while performing activities) is useful for people with different physical abilities who are still able to get around independently. Basic ADLs include the things many people do when they get up in the morning and get ready to go out of the house: get out of bed, go to the toilet, bathe, dress, groom, and eat. On the average, loss of function typically follows a particular order. Hygiene is the first to go, followed by loss of toilet use and  locomotion. The last to go is the ability to eat. When there is only one remaining area in which the person is independent, there is a 62.9% chance that it is eating and only a 3.5% chance that it is hygiene. Instrumental Activities of Daily Living (IADL or IADLs) are not necessary for fundamental functioning, but they let an individual live independently in a community. IADL consist of tasks that include: cleaning and maintaining the house; home establishment and maintenance; care of others (including selecting and supervising caregivers); care of pets; child rearing; managing money; managing financials (investments, etc.); meal preparation and cleanup; shopping for groceries and necessities; moving within the community; safety procedures and emergency responses; health management and maintenance (taking prescribed medications); and using the telephone or other form of communication.  Instructions:  Most patients tend to report their pain as a combination of two factors, their physical pain and their psychosocial pain. This last one is also known as "suffering" and it is reflection of how physical pain affects you socially and psychologically. From now on, report them separately.  From this point on, when asked to report your pain level, report only your physical pain. Use the following table for reference.  Pain Clinic Pain Levels (0-5/10)  Pain Level Score  Description  No Pain 0   Mild pain 1 Nagging, annoying, but does not interfere with basic activities of daily living (ADL). Patients are able to eat, bathe, get dressed, toileting (being able to get on and off the toilet and perform personal hygiene functions), transfer (move in and out of bed or a chair without assistance), and maintain continence (able to control bladder and bowel functions). Blood pressure and heart rate are unaffected. A normal heart rate for a healthy adult ranges from 60 to 100 bpm (beats per minute).  Mild to moderate pain  2 Noticeable and distracting. Impossible to hide from other people. More frequent flare-ups. Still possible to adapt and function close to normal. It can be very annoying and may have occasional stronger flare-ups. With discipline, patients may get used to it and adapt.   Moderate pain 3 Interferes significantly with activities of daily living (ADL). It becomes difficult to feed, bathe, get dressed, get on and off the toilet or to perform personal hygiene functions. Difficult to get in and out of bed or a chair without assistance. Very distracting. With effort, it can be ignored when deeply involved in activities.   Moderately severe pain 4 Impossible to ignore for more than a few minutes. With effort, patients may still be able to manage work or participate in some social activities. Very difficult to concentrate. Signs of autonomic nervous system discharge are evident: dilated pupils (mydriasis); mild sweating (diaphoresis); sleep interference. Heart rate becomes elevated (>115 bpm). Diastolic blood pressure (lower number) rises above 100 mmHg. Patients find relief in laying down and not moving.   Severe pain 5 Intense and extremely unpleasant. Associated with frowning face and frequent crying. Pain overwhelms the senses.  Ability to do any activity or maintain social relationships becomes significantly limited. Conversation becomes difficult. Pacing back and forth is common, as getting into a comfortable position is nearly impossible. Pain wakes you up from deep sleep. Physical signs will be obvious: pupillary dilation; increased sweating; goosebumps; brisk reflexes; cold, clammy hands and feet; nausea, vomiting or dry heaves; loss of appetite; significant sleep disturbance with inability to fall asleep or to remain asleep. When persistent, significant weight loss is observed due to the complete loss of appetite and sleep deprivation.  Blood pressure and heart rate becomes significantly elevated. Caution:  If elevated blood pressure triggers a pounding headache associated with blurred vision, then the patient should immediately seek attention at an urgent or emergency care unit, as these may be signs of an impending stroke.    Emergency Department Pain Levels (6-10/10)  Emergency Room Pain 6 Severely limiting. Requires emergency care and should not be seen or managed at an outpatient pain management facility. Communication becomes difficult and requires great effort. Assistance to reach the emergency department may be required. Facial flushing and profuse sweating along with potentially dangerous increases in heart rate and blood pressure will be evident.   Distressing pain 7 Self-care is very difficult. Assistance is required to transport, or use restroom. Assistance to reach the emergency department will be required. Tasks requiring coordination, such as bathing and getting dressed become very difficult.   Disabling pain 8 Self-care is no longer possible. At this level, pain is disabling. The individual is unable to do even the most "basic" activities such as walking, eating, bathing, dressing, transferring to a bed, or toileting. Fine motor skills are lost. It is difficult to think clearly.   Incapacitating pain 9 Pain becomes incapacitating. Thought processing is no longer possible. Difficult to remember your own name. Control of movement and coordination are lost.   The worst pain imaginable 10 At this level, most patients pass out from pain. When this level is reached, collapse of the autonomic nervous system occurs, leading to a sudden drop in blood pressure and heart rate. This in turn results in a temporary and dramatic drop in blood flow to the brain, leading to a loss of consciousness. Fainting is one of the body's self defense mechanisms. Passing out puts the brain in a calmed state  and causes it to shut down for a while, in order to begin the healing process.    Summary: 1. Refer to this  scale when providing [place] with your pain level. 2. Be accurate and careful when reporting your pain level. This will help with your care. 3. Over-reporting your pain level will lead to loss of credibility. 4. Even a level of 1/10 means that there is pain and will be treated at our facility. 5. High, inaccurate reporting will be documented as "Symptom Exaggeration", leading to loss of credibility and suspicions of possible secondary gains such as obtaining more narcotics, or wanting to appear disabled, for fraudulent reasons. 6. Only pain levels of 5 or below will be seen at our facility. 7. Pain levels of 6 and above will be sent to the Emergency Department and the appointment cancelled. ______________________________________________________________________________________________    BMI Assessment: Estimated body mass index is 29.6 kg/m as calculated from the following:   Height as of this encounter: _0  (1.702 m).   Weight as of this encounter: 189 lb (85.7 kg).  BMI interpretation table: BMI level Category Range association with higher incidence of chronic pain  <18 kg/m2 Underweight   18.5-24.9 kg/m2 Ideal body weight   25-29.9 kg/m2 Overweight Increased incidence by 20%  30-34.9 kg/m2 Obese (Class I) Increased incidence by 68%  35-39.9 kg/m2 Severe obesity (Class II) Increased incidence by 136%  >40 kg/m2 Extreme obesity (Class III) Increased incidence by 254%   BMI Readings from Last 4 Encounters:  07/14/18 29.60 kg/m  07/08/18 31.09 kg/m  07/04/18 30.93 kg/m  03/21/18 30.72 kg/m   Wt Readings from Last 4 Encounters:  07/14/18 189 lb (85.7 kg)  07/08/18 198 lb 8 oz (90 kg)  07/04/18 197 lb 8 oz (89.6 kg)  03/21/18 208 lb (94.3 kg)

## 2018-07-14 NOTE — Patient Instructions (Addendum)
____________________________________________________________________________________________  Appointment Policy Summary  It is our goal and responsibility to provide the medical community with assistance in the evaluation and management of patients with chronic pain. Unfortunately our resources are limited. Because we do not have an unlimited amount of time, or available appointments, we are required to closely monitor and manage their use. The following rules exist to maximize their use:  Patient's responsibilities: 1. Punctuality:  At what time should I arrive? You should be physically present in our office 30 minutes before your scheduled appointment. Your scheduled appointment is with your assigned healthcare provider. However, it takes 5-10 minutes to be "checked-in", and another 15 minutes for the nurses to do the admission. If you arrive to our office at the time you were given for your appointment, you will end up being at least 20-25 minutes late to your appointment with the provider. 2. Tardiness:  What happens if I arrive only a few minutes after my scheduled appointment time? You will need to reschedule your appointment. The cutoff is your appointment time. This is why it is so important that you arrive at least 30 minutes before that appointment. If you have an appointment scheduled for 10:00 AM and you arrive at 10:01, you will be required to reschedule your appointment.  3. Plan ahead:  Always assume that you will encounter traffic on your way in. Plan for it. If you are dependent on a driver, make sure they understand these rules and the need to arrive early. 4. Other appointments and responsibilities:  Avoid scheduling any other appointments before or after your pain clinic appointments.  5. Be prepared:  Write down everything that you need to discuss with your healthcare provider and give this information to the admitting nurse. Write down the medications that you will need  refilled. Bring your pills and bottles (even the empty ones), to all of your appointments, except for those where a procedure is scheduled. 6. No children or pets:  Find someone to take care of them. It is not appropriate to bring them in. 7. Scheduling changes:  We request "advanced notification" of any changes or cancellations. 8. Advanced notification:  Defined as a time period of more than 24 hours prior to the originally scheduled appointment. This allows for the appointment to be offered to other patients. 9. Rescheduling:  When a visit is rescheduled, it will require the cancellation of the original appointment. For this reason they both fall within the category of "Cancellations".  10. Cancellations:  They require advanced notification. Any cancellation less than 24 hours before the  appointment will be recorded as a "No Show". 11. No Show:  Defined as an unkept appointment where the patient failed to notify or declare to the practice their intention or inability to keep the appointment.  Corrective process for repeat offenders:  1. Tardiness: Three (3) episodes of rescheduling due to late arrivals will be recorded as one (1) "No Show". 2. Cancellation or reschedule: Three (3) cancellations or rescheduling will be recorded as one (1) "No Show". 3. "No Shows": Three (3) "No Shows" within a 12 month period will result in discharge from the practice. ____________________________________________________________________________________________   ______________________________________________________________________________________________  Specialty Pain Scale  Introduction:  There are significant differences in how pain is reported. The word pain usually refers to physical pain, but it is also a common synonym of suffering. The medical community uses a scale from 0 (zero) to 10 (ten) to report pain level. Zero (0) is described as "no pain",   while ten (10) is described as "the worse pain  you can imagine". The problem with this scale is that physical pain is reported along with suffering. Suffering refers to mental pain, or more often yet it refers to any unpleasant feeling, emotion or aversion associated with the perception of harm or threat of harm. It is the psychological component of pain.  Pain Specialists prefer to separate the two components. The pain scale used by this practice is the Verbal Numerical Rating Scale (VNRS-11). This scale is for the physical pain only. DO NOT INCLUDE how your pain psychologically affects you. This scale is for adults 21 years of age and older. It has 11 (eleven) levels. The 1st level is 0/10. This means: "right now, I have no pain". In the context of pain management, it also means: "right now, my physical pain is under control with the current therapy".  General Information:  The scale should reflect your current level of pain. Unless you are specifically asked for the level of your worst pain, or your average pain. If you are asked for one of these two, then it should be understood that it is over the past 24 hours.  Levels 1 (one) through 5 (five) are described below, and can be treated as an outpatient. Ambulatory pain management facilities such as ours are more than adequate to treat these levels. Levels 6 (six) through 10 (ten) are also described below, however, these must be treated as a hospitalized patient. While levels 6 (six) and 7 (seven) may be evaluated at an urgent care facility, levels 8 (eight) through 10 (ten) constitute medical emergencies and as such, they belong in a hospital's emergency department. When having these levels (as described below), do not come to our office. Our facility is not equipped to manage these levels. Go directly to an urgent care facility or an emergency department to be evaluated.  Definitions:  Activities of Daily Living (ADL): Activities of daily living (ADL or ADLs) is a term used in healthcare to refer to  people's daily self-care activities. Health professionals often use a person's ability or inability to perform ADLs as a measurement of their functional status, particularly in regard to people post injury, with disabilities and the elderly. There are two ADL levels: Basic and Instrumental. Basic Activities of Daily Living (BADL  or BADLs) consist of self-care tasks that include: Bathing and showering; personal hygiene and grooming (including brushing/combing/styling hair); dressing; Toilet hygiene (getting to the toilet, cleaning oneself, and getting back up); eating and self-feeding (not including cooking or chewing and swallowing); functional mobility, often referred to as "transferring", as measured by the ability to walk, get in and out of bed, and get into and out of a chair; the broader definition (moving from one place to another while performing activities) is useful for people with different physical abilities who are still able to get around independently. Basic ADLs include the things many people do when they get up in the morning and get ready to go out of the house: get out of bed, go to the toilet, bathe, dress, groom, and eat. On the average, loss of function typically follows a particular order. Hygiene is the first to go, followed by loss of toilet use and locomotion. The last to go is the ability to eat. When there is only one remaining area in which the person is independent, there is a 62.9% chance that it is eating and only a 3.5% chance that it is hygiene. Instrumental Activities   of Daily Living (IADL or IADLs) are not necessary for fundamental functioning, but they let an individual live independently in a community. IADL consist of tasks that include: cleaning and maintaining the house; home establishment and maintenance; care of others (including selecting and supervising caregivers); care of pets; child rearing; managing money; managing financials (investments, etc.); meal preparation  and cleanup; shopping for groceries and necessities; moving within the community; safety procedures and emergency responses; health management and maintenance (taking prescribed medications); and using the telephone or other form of communication.  Instructions:  Most patients tend to report their pain as a combination of two factors, their physical pain and their psychosocial pain. This last one is also known as "suffering" and it is reflection of how physical pain affects you socially and psychologically. From now on, report them separately.  From this point on, when asked to report your pain level, report only your physical pain. Use the following table for reference.  Pain Clinic Pain Levels (0-5/10)  Pain Level Score  Description  No Pain 0   Mild pain 1 Nagging, annoying, but does not interfere with basic activities of daily living (ADL). Patients are able to eat, bathe, get dressed, toileting (being able to get on and off the toilet and perform personal hygiene functions), transfer (move in and out of bed or a chair without assistance), and maintain continence (able to control bladder and bowel functions). Blood pressure and heart rate are unaffected. A normal heart rate for a healthy adult ranges from 60 to 100 bpm (beats per minute).   Mild to moderate pain 2 Noticeable and distracting. Impossible to hide from other people. More frequent flare-ups. Still possible to adapt and function close to normal. It can be very annoying and may have occasional stronger flare-ups. With discipline, patients may get used to it and adapt.   Moderate pain 3 Interferes significantly with activities of daily living (ADL). It becomes difficult to feed, bathe, get dressed, get on and off the toilet or to perform personal hygiene functions. Difficult to get in and out of bed or a chair without assistance. Very distracting. With effort, it can be ignored when deeply involved in activities.   Moderately severe pain  4 Impossible to ignore for more than a few minutes. With effort, patients may still be able to manage work or participate in some social activities. Very difficult to concentrate. Signs of autonomic nervous system discharge are evident: dilated pupils (mydriasis); mild sweating (diaphoresis); sleep interference. Heart rate becomes elevated (>115 bpm). Diastolic blood pressure (lower number) rises above 100 mmHg. Patients find relief in laying down and not moving.   Severe pain 5 Intense and extremely unpleasant. Associated with frowning face and frequent crying. Pain overwhelms the senses.  Ability to do any activity or maintain social relationships becomes significantly limited. Conversation becomes difficult. Pacing back and forth is common, as getting into a comfortable position is nearly impossible. Pain wakes you up from deep sleep. Physical signs will be obvious: pupillary dilation; increased sweating; goosebumps; brisk reflexes; cold, clammy hands and feet; nausea, vomiting or dry heaves; loss of appetite; significant sleep disturbance with inability to fall asleep or to remain asleep. When persistent, significant weight loss is observed due to the complete loss of appetite and sleep deprivation.  Blood pressure and heart rate becomes significantly elevated. Caution: If elevated blood pressure triggers a pounding headache associated with blurred vision, then the patient should immediately seek attention at an urgent or emergency care unit, as   these may be signs of an impending stroke.    Emergency Department Pain Levels (6-10/10)  Emergency Room Pain 6 Severely limiting. Requires emergency care and should not be seen or managed at an outpatient pain management facility. Communication becomes difficult and requires great effort. Assistance to reach the emergency department may be required. Facial flushing and profuse sweating along with potentially dangerous increases in heart rate and blood pressure  will be evident.   Distressing pain 7 Self-care is very difficult. Assistance is required to transport, or use restroom. Assistance to reach the emergency department will be required. Tasks requiring coordination, such as bathing and getting dressed become very difficult.   Disabling pain 8 Self-care is no longer possible. At this level, pain is disabling. The individual is unable to do even the most "basic" activities such as walking, eating, bathing, dressing, transferring to a bed, or toileting. Fine motor skills are lost. It is difficult to think clearly.   Incapacitating pain 9 Pain becomes incapacitating. Thought processing is no longer possible. Difficult to remember your own name. Control of movement and coordination are lost.   The worst pain imaginable 10 At this level, most patients pass out from pain. When this level is reached, collapse of the autonomic nervous system occurs, leading to a sudden drop in blood pressure and heart rate. This in turn results in a temporary and dramatic drop in blood flow to the brain, leading to a loss of consciousness. Fainting is one of the body's self defense mechanisms. Passing out puts the brain in a calmed state and causes it to shut down for a while, in order to begin the healing process.    Summary: 1. Refer to this scale when providing [place] with your pain level. 2. Be accurate and careful when reporting your pain level. This will help with your care. 3. Over-reporting your pain level will lead to loss of credibility. 4. Even a level of 1/10 means that there is pain and will be treated at our facility. 5. High, inaccurate reporting will be documented as "Symptom Exaggeration", leading to loss of credibility and suspicions of possible secondary gains such as obtaining more narcotics, or wanting to appear disabled, for fraudulent reasons. 6. Only pain levels of 5 or below will be seen at our facility. 7. Pain levels of 6 and above will be sent to the  Emergency Department and the appointment cancelled. ______________________________________________________________________________________________    BMI Assessment: Estimated body mass index is 29.6 kg/m as calculated from the following:   Height as of this encounter: 5\' 7"  (1.702 m).   Weight as of this encounter: 189 lb (85.7 kg).  BMI interpretation table: BMI level Category Range association with higher incidence of chronic pain  <18 kg/m2 Underweight   18.5-24.9 kg/m2 Ideal body weight   25-29.9 kg/m2 Overweight Increased incidence by 20%  30-34.9 kg/m2 Obese (Class I) Increased incidence by 68%  35-39.9 kg/m2 Severe obesity (Class II) Increased incidence by 136%  >40 kg/m2 Extreme obesity (Class III) Increased incidence by 254%   BMI Readings from Last 4 Encounters:  07/14/18 29.60 kg/m  07/08/18 31.09 kg/m  07/04/18 30.93 kg/m  03/21/18 30.72 kg/m   Wt Readings from Last 4 Encounters:  07/14/18 189 lb (85.7 kg)  07/08/18 198 lb 8 oz (90 kg)  07/04/18 197 lb 8 oz (89.6 kg)  03/21/18 208 lb (94.3 kg)

## 2018-07-17 LAB — COMPLIANCE DRUG ANALYSIS, UR

## 2018-07-19 LAB — COMP. METABOLIC PANEL (12)
AST: 19 IU/L (ref 0–40)
Albumin/Globulin Ratio: 1.4 (ref 1.2–2.2)
Albumin: 4.6 g/dL (ref 3.7–4.7)
Alkaline Phosphatase: 98 IU/L (ref 39–117)
BUN/Creatinine Ratio: 35 — ABNORMAL HIGH (ref 10–24)
BUN: 22 mg/dL (ref 8–27)
Bilirubin Total: <0.2 mg/dL (ref 0.0–1.2)
Calcium: 9.2 mg/dL (ref 8.6–10.2)
Chloride: 97 mmol/L (ref 96–106)
Creatinine, Ser: 0.63 mg/dL — ABNORMAL LOW (ref 0.76–1.27)
GFR calc Af Amer: 109 mL/min/{1.73_m2} (ref >59)
GFR, EST NON AFRICAN AMERICAN: 94 mL/min/{1.73_m2} (ref >59)
Globulin, Total: 3.3 g/dL (ref 1.5–4.5)
Glucose: 103 mg/dL — ABNORMAL HIGH (ref 65–99)
Potassium: 4.9 mmol/L (ref 3.5–5.2)
Sodium: 141 mmol/L (ref 134–144)
Total Protein: 7.9 g/dL (ref 6.0–8.5)

## 2018-07-19 LAB — 25-HYDROXY VITAMIN D LCMS D2+D3
25-Hydroxy, Vitamin D-2: <1 ng/mL
25-Hydroxy, Vitamin D: 30 ng/mL

## 2018-07-19 LAB — C-REACTIVE PROTEIN: CRP: 3 mg/L (ref 0–10)

## 2018-07-19 LAB — SEDIMENTATION RATE: Sed Rate: 51 mm/hr — ABNORMAL HIGH (ref 0–30)

## 2018-07-19 LAB — VITAMIN B12: Vitamin B-12: 735 pg/mL (ref 232–1245)

## 2018-07-19 LAB — MAGNESIUM: Magnesium: 2 mg/dL (ref 1.6–2.3)

## 2018-07-19 LAB — 25-HYDROXYVITAMIN D LCMS D2+D3: 25-HYDROXY, VITAMIN D-3: 30 ng/mL

## 2018-07-21 ENCOUNTER — Ambulatory Visit
Admission: RE | Admit: 2018-07-21 | Discharge: 2018-07-21 | Disposition: A | Discharge status: Home / Self Care | Payer: Medicare Other | Attending: Nurse Practitioner | Admitting: Nurse Practitioner

## 2018-07-21 ENCOUNTER — Ambulatory Visit
Admission: RE | Admit: 2018-07-21 | Discharge: 2018-07-21 | Disposition: A | Discharge status: Home / Self Care | Payer: Medicare Other | Source: Ambulatory Visit | Attending: Nurse Practitioner | Admitting: Nurse Practitioner

## 2018-07-21 DIAGNOSIS — G8929 Other chronic pain: Secondary | ICD-10-CM | POA: Insufficient documentation

## 2018-07-21 DIAGNOSIS — M533 Sacrococcygeal disorders, not elsewhere classified: Secondary | ICD-10-CM

## 2018-07-22 NOTE — Progress Notes (Signed)
Results were reviewed and found to be: mildly abnormal  No acute injury or pathology identified  Review would suggest interventional pain management techniques may be of benefit 

## 2018-08-04 ENCOUNTER — Other Ambulatory Visit: Payer: Self-pay

## 2018-08-04 ENCOUNTER — Encounter: Payer: Self-pay | Admitting: Pain Medicine

## 2018-08-04 ENCOUNTER — Ambulatory Visit: Payer: Medicare Other | Attending: Pain Medicine | Admitting: Pain Medicine

## 2018-08-04 VITALS — BP 150/41 | HR 65 | Temp 97.7°F | Resp 18 | Ht 68.0 in | Wt 194.0 lb

## 2018-08-04 DIAGNOSIS — M533 Sacrococcygeal disorders, not elsewhere classified: Secondary | ICD-10-CM

## 2018-08-04 DIAGNOSIS — M79605 Pain in left leg: Secondary | ICD-10-CM

## 2018-08-04 DIAGNOSIS — G8929 Other chronic pain: Secondary | ICD-10-CM

## 2018-08-04 DIAGNOSIS — E1151 Type 2 diabetes mellitus with diabetic peripheral angiopathy without gangrene: Secondary | ICD-10-CM

## 2018-08-04 DIAGNOSIS — M47816 Spondylosis without myelopathy or radiculopathy, lumbar region: Secondary | ICD-10-CM | POA: Diagnosis not present

## 2018-08-04 DIAGNOSIS — M48061 Spinal stenosis, lumbar region without neurogenic claudication: Secondary | ICD-10-CM

## 2018-08-04 DIAGNOSIS — M545 Low back pain, unspecified: Secondary | ICD-10-CM

## 2018-08-04 DIAGNOSIS — T451X5A Adverse effect of antineoplastic and immunosuppressive drugs, initial encounter: Secondary | ICD-10-CM

## 2018-08-04 DIAGNOSIS — Q7649 Other congenital malformations of spine, not associated with scoliosis: Secondary | ICD-10-CM | POA: Diagnosis not present

## 2018-08-04 DIAGNOSIS — G620 Drug-induced polyneuropathy: Secondary | ICD-10-CM

## 2018-08-04 DIAGNOSIS — G608 Other hereditary and idiopathic neuropathies: Secondary | ICD-10-CM | POA: Diagnosis present

## 2018-08-04 DIAGNOSIS — G894 Chronic pain syndrome: Secondary | ICD-10-CM | POA: Diagnosis not present

## 2018-08-04 DIAGNOSIS — M79604 Pain in right leg: Secondary | ICD-10-CM | POA: Diagnosis not present

## 2018-08-04 NOTE — Patient Instructions (Addendum)
______________________________________________________________________________________________  Specialty Pain Scale  Introduction:  There are significant differences in how pain is reported. The word pain usually refers to physical pain, but it is also a common synonym of suffering. The medical community uses a scale from 0 (zero) to 10 (ten) to report pain level. Zero (0) is described as "no pain", while ten (10) is described as "the worse pain you can imagine". The problem with this scale is that physical pain is reported along with suffering. Suffering refers to mental pain, or more often yet it refers to any unpleasant feeling, emotion or aversion associated with the perception of harm or threat of harm. It is the psychological component of pain.  Pain Specialists prefer to separate the two components. The pain scale used by this practice is the Verbal Numerical Rating Scale (VNRS-11). This scale is for the physical pain only. DO NOT INCLUDE how your pain psychologically affects you. This scale is for adults 21 years of age and older. It has 11 (eleven) levels. The 1st level is 0/10. This means: "right now, I have no pain". In the context of pain management, it also means: "right now, my physical pain is under control with the current therapy".  General Information:  The scale should reflect your current level of pain. Unless you are specifically asked for the level of your worst pain, or your average pain. If you are asked for one of these two, then it should be understood that it is over the past 24 hours.  Levels 1 (one) through 5 (five) are described below, and can be treated as an outpatient. Ambulatory pain management facilities such as ours are more than adequate to treat these levels. Levels 6 (six) through 10 (ten) are also described below, however, these must be treated as a hospitalized patient. While levels 6 (six) and 7 (seven) may be evaluated at an urgent care facility, levels 8  (eight) through 10 (ten) constitute medical emergencies and as such, they belong in a hospital's emergency department. When having these levels (as described below), do not come to our office. Our facility is not equipped to manage these levels. Go directly to an urgent care facility or an emergency department to be evaluated.  Definitions:  Activities of Daily Living (ADL): Activities of daily living (ADL or ADLs) is a term used in healthcare to refer to people's daily self-care activities. Health professionals often use a person's ability or inability to perform ADLs as a measurement of their functional status, particularly in regard to people post injury, with disabilities and the elderly. There are two ADL levels: Basic and Instrumental. Basic Activities of Daily Living (BADL  or BADLs) consist of self-care tasks that include: Bathing and showering; personal hygiene and grooming (including brushing/combing/styling hair); dressing; Toilet hygiene (getting to the toilet, cleaning oneself, and getting back up); eating and self-feeding (not including cooking or chewing and swallowing); functional mobility, often referred to as "transferring", as measured by the ability to walk, get in and out of bed, and get into and out of a chair; the broader definition (moving from one place to another while performing activities) is useful for people with different physical abilities who are still able to get around independently. Basic ADLs include the things many people do when they get up in the morning and get ready to go out of the house: get out of bed, go to the toilet, bathe, dress, groom, and eat. On the average, loss of function typically follows a particular order.   Hygiene is the first to go, followed by loss of toilet use and locomotion. The last to go is the ability to eat. When there is only one remaining area in which the person is independent, there is a 62.9% chance that it is eating and only a 3.5% chance  that it is hygiene. Instrumental Activities of Daily Living (IADL or IADLs) are not necessary for fundamental functioning, but they let an individual live independently in a community. IADL consist of tasks that include: cleaning and maintaining the house; home establishment and maintenance; care of others (including selecting and supervising caregivers); care of pets; child rearing; managing money; managing financials (investments, etc.); meal preparation and cleanup; shopping for groceries and necessities; moving within the community; safety procedures and emergency responses; health management and maintenance (taking prescribed medications); and using the telephone or other form of communication.  Instructions:  Most patients tend to report their pain as a combination of two factors, their physical pain and their psychosocial pain. This last one is also known as "suffering" and it is reflection of how physical pain affects you socially and psychologically. From now on, report them separately.  From this point on, when asked to report your pain level, report only your physical pain. Use the following table for reference.  Pain Clinic Pain Levels (0-5/10)  Pain Level Score  Description  No Pain 0   Mild pain 1 Nagging, annoying, but does not interfere with basic activities of daily living (ADL). Patients are able to eat, bathe, get dressed, toileting (being able to get on and off the toilet and perform personal hygiene functions), transfer (move in and out of bed or a chair without assistance), and maintain continence (able to control bladder and bowel functions). Blood pressure and heart rate are unaffected. A normal heart rate for a healthy adult ranges from 60 to 100 bpm (beats per minute).   Mild to moderate pain 2 Noticeable and distracting. Impossible to hide from other people. More frequent flare-ups. Still possible to adapt and function close to normal. It can be very annoying and may have  occasional stronger flare-ups. With discipline, patients may get used to it and adapt.   Moderate pain 3 Interferes significantly with activities of daily living (ADL). It becomes difficult to feed, bathe, get dressed, get on and off the toilet or to perform personal hygiene functions. Difficult to get in and out of bed or a chair without assistance. Very distracting. With effort, it can be ignored when deeply involved in activities.   Moderately severe pain 4 Impossible to ignore for more than a few minutes. With effort, patients may still be able to manage work or participate in some social activities. Very difficult to concentrate. Signs of autonomic nervous system discharge are evident: dilated pupils (mydriasis); mild sweating (diaphoresis); sleep interference. Heart rate becomes elevated (>115 bpm). Diastolic blood pressure (lower number) rises above 100 mmHg. Patients find relief in laying down and not moving.   Severe pain 5 Intense and extremely unpleasant. Associated with frowning face and frequent crying. Pain overwhelms the senses.  Ability to do any activity or maintain social relationships becomes significantly limited. Conversation becomes difficult. Pacing back and forth is common, as getting into a comfortable position is nearly impossible. Pain wakes you up from deep sleep. Physical signs will be obvious: pupillary dilation; increased sweating; goosebumps; brisk reflexes; cold, clammy hands and feet; nausea, vomiting or dry heaves; loss of appetite; significant sleep disturbance with inability to fall asleep or to   remain asleep. When persistent, significant weight loss is observed due to the complete loss of appetite and sleep deprivation.  Blood pressure and heart rate becomes significantly elevated. Caution: If elevated blood pressure triggers a pounding headache associated with blurred vision, then the patient should immediately seek attention at an urgent or emergency care unit, as  these may be signs of an impending stroke.    Emergency Department Pain Levels (6-10/10)  Emergency Room Pain 6 Severely limiting. Requires emergency care and should not be seen or managed at an outpatient pain management facility. Communication becomes difficult and requires great effort. Assistance to reach the emergency department may be required. Facial flushing and profuse sweating along with potentially dangerous increases in heart rate and blood pressure will be evident.   Distressing pain 7 Self-care is very difficult. Assistance is required to transport, or use restroom. Assistance to reach the emergency department will be required. Tasks requiring coordination, such as bathing and getting dressed become very difficult.   Disabling pain 8 Self-care is no longer possible. At this level, pain is disabling. The individual is unable to do even the most "basic" activities such as walking, eating, bathing, dressing, transferring to a bed, or toileting. Fine motor skills are lost. It is difficult to think clearly.   Incapacitating pain 9 Pain becomes incapacitating. Thought processing is no longer possible. Difficult to remember your own name. Control of movement and coordination are lost.   The worst pain imaginable 10 At this level, most patients pass out from pain. When this level is reached, collapse of the autonomic nervous system occurs, leading to a sudden drop in blood pressure and heart rate. This in turn results in a temporary and dramatic drop in blood flow to the brain, leading to a loss of consciousness. Fainting is one of the body's self defense mechanisms. Passing out puts the brain in a calmed state and causes it to shut down for a while, in order to begin the healing process.    Summary: 1. Refer to this scale when providing [place] with your pain level. 2. Be accurate and careful when reporting your pain level. This will help with your care. 3. Over-reporting your pain level will  lead to loss of credibility. 4. Even a level of 1/10 means that there is pain and will be treated at our facility. 5. High, inaccurate reporting will be documented as "Symptom Exaggeration", leading to loss of credibility and suspicions of possible secondary gains such as obtaining more narcotics, or wanting to appear disabled, for fraudulent reasons. 6. Only pain levels of 5 or below will be seen at our facility. 7. Pain levels of 6 and above will be sent to the Emergency Department and the appointment cancelled. ______________________________________________________________________________________________   ____________________________________________________________________________________________  Medication Rules  Purpose: To inform patients, and their family members, of our rules and regulations.  Applies to: All patients receiving prescriptions (written or electronic).  Pharmacy of record: Pharmacy where electronic prescriptions will be sent. If written prescriptions are taken to a different pharmacy, please inform the nursing staff. The pharmacy listed in the electronic medical record should be the one where you would like electronic prescriptions to be sent.  Electronic prescriptions: In compliance with the [place] (STOP) Act of 2017 (Session Lanny Cramp [phone]), effective June 11, 2018, all controlled substances must be electronically prescribed. Calling prescriptions to the pharmacy will cease to exist.  Prescription refills: Only during scheduled appointments. Applies to all prescriptions.  NOTE: The  following applies primarily to controlled substances (Opioid* Pain Medications).   Patient's responsibilities: 1. Pain Pills: Bring all pain pills to every appointment (except for procedure appointments). 2. Pill Bottles: Bring pills in original pharmacy bottle. Always bring the newest bottle. Bring bottle, even if empty. 3. Medication  refills: You are responsible for knowing and keeping track of what medications you take and those you need refilled. The day before your appointment: write a list of all prescriptions that need to be refilled. The day of the appointment: give the list to the admitting nurse. Prescriptions will be written only during appointments. No prescriptions will be written on procedure days. If you forget a medication: it will not be "Called in", "Faxed", or "electronically sent". You will need to get another appointment to get these prescribed. No early refills. Do not call asking to have your prescription filled early. 4. Prescription Accuracy: You are responsible for carefully inspecting your prescriptions before leaving our office. Have the discharge nurse carefully go over each prescription with you, before taking them home. Make sure that your name is accurately spelled, that your address is correct. Check the name and dose of your medication to make sure it is accurate. Check the number of pills, and the written instructions to make sure they are clear and accurate. Make sure that you are given enough medication to last until your next medication refill appointment. 5. Taking Medication: Take medication as prescribed. When it comes to controlled substances, taking less pills or less frequently than prescribed is permitted and encouraged. Never take more pills than instructed. Never take medication more frequently than prescribed.  6. Inform other Doctors: Always inform, all of your healthcare providers, of all the medications you take. 7. Pain Medication from other Providers: You are not allowed to accept any additional pain medication from any other Doctor or Healthcare provider. There are two exceptions to this rule. (see below) In the event that you require additional pain medication, you are responsible for notifying us, as stated below. 8. Medication Agreement: You are responsible for carefully reading  and following our Medication Agreement. This must be signed before receiving any prescriptions from our practice. Safely store a copy of your signed Agreement. Violations to the Agreement will result in no further prescriptions. (Additional copies of our Medication Agreement are available upon request.) 9. Laws, Rules, & Regulations: All patients are expected to follow all Federal and [employer], [employer], Rules, [employer]. Ignorance of the Laws does not constitute a valid excuse. The use of any illegal substances is prohibited. 10. Adopted CDC guidelines & recommendations: Target dosing levels will be at or below 60 MME/day. Use of benzodiazepines** is not recommended.  Exceptions: There are only two exceptions to the rule of not receiving pain medications from other Healthcare Providers. 1. Exception #1 (Emergencies): In the event of an emergency (i.e.: accident requiring emergency care), you are allowed to receive additional pain medication. However, you are responsible for: As soon as you are able, call our office [phone], at any time of the day or night, and leave a message stating your name, the date and nature of the emergency, and the name and dose of the medication prescribed. In the event that your call is answered by a member of our staff, make sure to document and save the date, time, and the name of the person that took your information.  2. Exception #2 (Planned Surgery): In the event that you are scheduled by another doctor or dentist  to have any type of surgery or procedure, you are allowed (for a period no longer than 30 days), to receive additional pain medication, for the acute post-op pain. However, in this case, you are responsible for picking up a copy of our "Post-op Pain Management for Surgeons" handout, and giving it to your surgeon or dentist. This document is available at our office, and does not require an appointment to obtain it. Simply go to our office during business  hours (Monday-Thursday from 8:00 AM to 4:00 PM) (Friday 8:00 AM to 12:00 Noon) or if you have a scheduled appointment with [place], prior to your surgery, and ask for it by name. In addition, you will need to provide [place] with your name, name of your surgeon, type of surgery, and date of procedure or surgery.  *Opioid medications include: morphine, codeine, oxycodone, oxymorphone, hydrocodone, hydromorphone, meperidine, tramadol, tapentadol, buprenorphine, fentanyl, methadone. **Benzodiazepine medications include: diazepam (Valium), alprazolam (Xanax), clonazepam (Klonopine), lorazepam (Ativan), clorazepate (Tranxene), chlordiazepoxide (Librium), estazolam (Prosom), oxazepam (Serax), temazepam (Restoril), triazolam (Halcion) (Last updated: 08/08/2017) ____________________________________________________________________________________________   ____________________________________________________________________________________________  Medication Recommendations and Reminders  Applies to: All patients receiving prescriptions (written and/or electronic).  Medication Rules & Regulations: These rules and regulations exist for your safety and that of others. They are not flexible and neither are we. Dismissing or ignoring them will be considered "non-compliance" with medication therapy, resulting in complete and irreversible termination of such therapy. (See document titled "Medication Rules" for more details.) In all conscience, because of safety reasons, we cannot continue providing a therapy where the patient does not follow instructions.  Pharmacy of record:   Definition: This is the pharmacy where your electronic prescriptions will be sent.   We do not endorse any particular pharmacy.  You are not restricted in your choice of pharmacy.  The pharmacy listed in the electronic medical record should be the one where you want electronic prescriptions to be sent.  If you choose to change pharmacy,  simply notify our nursing staff of your choice of new pharmacy.  Recommendations:  Keep all of your pain medications in a safe place, under lock and key, even if you live alone.   After you fill your prescription, take 1 week's worth of pills and put them away in a safe place. You should keep a separate, properly labeled bottle for this purpose. The remainder should be kept in the original bottle. Use this as your primary supply, until it runs out. Once it's gone, then you know that you have 1 week's worth of medicine, and it is time to come in for a prescription refill. If you do this correctly, it is unlikely that you will ever run out of medicine.  To make sure that the above recommendation works, it is very important that you make sure your medication refill appointments are scheduled at least 1 week before you run out of medicine. To do this in an effective manner, make sure that you do not leave the office without scheduling your next medication management appointment. Always ask the nursing staff to show you in your prescription , when your medication will be running out. Then arrange for the receptionist to get you a return appointment, at least 7 days before you run out of medicine. Do not wait until you have 1 or 2 pills left, to come in. This is very poor planning and does not take into consideration that we may need to cancel appointments due to bad weather, sickness, or emergencies affecting our  staff.  "Partial Fill": If for any reason your pharmacy does not have enough pills/tablets to completely fill or refill your prescription, do not allow for a "partial fill". You will need a separate prescription to fill the remaining amount, which we will not provide. If the reason for the partial fill is your insurance, you will need to talk to the pharmacist about payment alternatives for the remaining tablets, but again, do not accept a partial fill.  Prescription refills and/or changes in  medication(s):   Prescription refills, and/or changes in dose or medication, will be conducted only during scheduled medication management appointments. (Applies to both, written and electronic prescriptions.)  No refills on procedure days. No medication will be changed or started on procedure days. No changes, adjustments, and/or refills will be conducted on a procedure day. Doing so will interfere with the diagnostic portion of the procedure.  No phone refills. No medications will be "called into the pharmacy".  No Fax refills.  No weekend refills.  No Holliday refills.  No after hours refills.  Remember:  Business hours are:  Monday to Thursday 8:00 AM to 4:00 PM Provider's Schedule: Dionisio David, NP - Appointments are:  Medication management: Monday to Thursday 8:00 AM to 4:00 PM Milinda Pointer, MD - Appointments are:  Medication management: Monday and Wednesday 8:00 AM to 4:00 PM Procedure day: Tuesday and Thursday 7:30 AM to 4:00 PM Gillis Santa, MD - Appointments are:  Medication management: Tuesday and Thursday 8:00 AM to 4:00 PM Procedure day: Monday and Wednesday 7:30 AM to 4:00 PM (Last update: 08/08/2017) ____________________________________________________________________________________________   ____________________________________________________________________________________________  CANNABIDIOL (AKA: CBD Oil or Pills)  Applies to: All patients receiving prescriptions of controlled substances (written and/or electronic).  General Information: Cannabidiol (CBD) was discovered in 6. It is one of some 113 identified cannabinoids in cannabis (Marijuana) plants, accounting for up to 40% of the plant's extract. As of 2018, preliminary clinical research on cannabidiol included studies of anxiety, cognition, movement disorders, and pain.  Cannabidiol is consummed in multiple ways, including inhalation of cannabis smoke or vapor, as an aerosol spray into the  cheek, and by mouth. It may be supplied as CBD oil containing CBD as the active ingredient (no added tetrahydrocannabinol (THC) or terpenes), a full-plant CBD-dominant hemp extract oil, capsules, dried cannabis, or as a liquid solution. CBD is thought not have the same psychoactivity as THC, and may affect the actions of THC. Studies suggest that CBD may interact with different biological targets, including cannabinoid receptors and other neurotransmitter receptors. As of 2018 the mechanism of action for its biological effects has not been determined.  In the [place], cannabidiol has a limited approval by the Food and Drug Administration (FDA) for treatment of only two types of epilepsy disorders. The side effects of long-term use of the drug include somnolence, decreased appetite, diarrhea, fatigue, malaise, weakness, sleeping problems, and others.  CBD remains a Schedule I drug prohibited for any use.  Legality: Some manufacturers ship CBD products nationally, an illegal action which the FDA has not enforced in 2018, with CBD remaining the subject of an FDA investigational new drug evaluation, and is not considered legal as a dietary supplement or food ingredient as of December 2018. Federal illegality has made it difficult historically to conduct research on CBD. CBD is openly sold in head shops and health food stores in some states where such sales have not been explicitly legalized.  Warning: Because it is not FDA approved for general use or  treatment of pain, it is not required to undergo the same manufacturing controls as prescription drugs.  This means that the available cannabidiol (CBD) may be contaminated with THC.  If this is the case, it will trigger a positive urine drug screen (UDS) test for cannabinoids (Marijuana).  Because a positive UDS for illicit substances is a violation of our medication agreement, your opioid analgesics (pain medicine) may be permanently discontinued. (Last  update: 08/29/2017) ____________________________________________________________________________________________   ____________________________________________________________________________________________  Muscle Spasms & Cramps  Cause:  The most common cause of muscle spasms and cramps is vitamin and/or electrolyte (calcium, potassium, sodium, etc.) deficiencies.  Possible triggers: Sweating - causes loss of electrolytes thru the skin. Steroids - causes loss of electrolytes thru the urine.  Treatment: 8. Gatorade (or any other electrolyte-replenishing drink) - Take 1, 8 oz glass with each meal (3 times a day). 9. OTC (over-the-counter) Magnesium 400 to 500 mg - Take 1 tablet twice a day (one with breakfast and one before bedtime). If you have kidney problems, talk to your primary care physician before taking any Magnesium. 10. Tonic Water with quinine - Take 1, 8 oz glass before bedtime.   ____________________________________________________________________________________________    ____________________________________________________________________________________________  Preparing for Procedure with Sedation  Instructions: . Oral Intake: Do not eat or drink anything for at least 8 hours prior to your procedure. . Transportation: Public transportation is not allowed. Bring an adult driver. The driver must be physically present in our waiting room before any procedure can be started. Marland Kitchen Physical Assistance: Bring an adult physically capable of assisting you, in the event you need help. This adult should keep you company at home for at least 6 hours after the procedure. . Blood Pressure Medicine: Take your blood pressure medicine with a sip of water the morning of the procedure. . Blood thinners: Notify our staff if you are taking any blood thinners. Depending on which one you take, there will be specific instructions on how and when to stop it. . Diabetics on insulin: Notify  the staff so that you can be scheduled 1st case in the morning. If your diabetes requires high dose insulin, take only  of your normal insulin dose the morning of the procedure and notify the staff that you have done so. . Preventing infections: Shower with an antibacterial soap the morning of your procedure. . Build-up your immune system: Take 1000 mg of Vitamin C with every meal (3 times a day) the day prior to your procedure. Marland Kitchen Antibiotics: Inform the staff if you have a condition or reason that requires you to take antibiotics before dental procedures. . Pregnancy: If you are pregnant, call and cancel the procedure. . Sickness: If you have a cold, fever, or any active infections, call and cancel the procedure. . Arrival: You must be in the facility at least 30 minutes prior to your scheduled procedure. . Children: Do not bring children with you. . Dress appropriately: Bring dark clothing that you would not mind if they get stained. . Valuables: Do not bring any jewelry or valuables.  Procedure appointments are reserved for interventional treatments only. Marland Kitchen No Prescription Refills. . No medication changes will be discussed during procedure appointments. . No disability issues will be discussed.  Reasons to call and reschedule or cancel your procedure: (Following these recommendations will minimize the risk of a serious complication.) . Surgeries: Avoid having procedures within 2 weeks of any surgery. (Avoid for 2 weeks before or after any surgery). . Flu Shots: Avoid  having procedures within 2 weeks of a flu shots or . (Avoid for 2 weeks before or after immunizations). . Barium: Avoid having a procedure within 7-10 days after having had a radiological study involving the use of radiological contrast. (Myelograms, Barium swallow or enema study). . Heart attacks: Avoid any elective procedures or surgeries for the initial 6 months after a "Myocardial Infarction" (Heart Attack). . Blood  thinners: It is imperative that you stop these medications before procedures. Let us know if you if you take any blood thinner.  . Infection: Avoid procedures during or within two weeks of an infection (including chest colds or gastrointestinal problems). Symptoms associated with infections include: Localized redness, fever, chills, night sweats or profuse sweating, burning sensation when voiding, cough, congestion, stuffiness, runny nose, sore throat, diarrhea, nausea, vomiting, cold or Flu symptoms, recent or current infections. It is specially important if the infection is over the area that we intend to treat. Marland Kitchen Heart and lung problems: Symptoms that may suggest an active cardiopulmonary problem include: cough, chest pain, breathing difficulties or shortness of breath, dizziness, ankle swelling, uncontrolled high or unusually low blood pressure, and/or palpitations. If you are experiencing any of these symptoms, cancel your procedure and contact your primary care physician for an evaluation.  Remember:  Regular Business hours are:  Monday to Thursday 8:00 AM to 4:00 PM  Provider's Schedule: Milinda Pointer, MD:  Procedure days: Tuesday and Thursday 7:30 AM to 4:00 PM  Gillis Santa, MD:  Procedure days: Monday and Wednesday 7:30 AM to 4:00 PM ____________________________________________________________________________________________   ____________________________________________________________________________________________  Blood Thinners  Recommended Time Interval Before and After Neuraxial Block or Catheter Removal  Drug (Generic) Brand Name Time Before Time After Comments  Abciximab Reopro 15 days 2 hours   Alteplase Activase 10 days 10 days   Apixaban Eliquis 3 days 6 hours   Aspirin > 325 mg Goody Powders/Excedrin 11 days  (Usually not stopped)  Aspirin ? 81 mg  7 days  (Usually not stopped)  Cholesterol Medication Lipitor 4 days    Cilostazol Pletal 3 days 5 hours    Clopidogrel Plavix 7-10 days 2 hours   Dabigatran Pradaxa 5 days 6 hours   Delteparin Fragmin 24 hours 4 hours   Dipyridamole + ASA Aggrenox 11days 2 hours   Enoxaparin  Lovenox 24 hours 4 hours   Eptifibatide Integrillin 8 hours 2 hours   Fish oil  4 days    Fondaparinux  Arixtra 72 hours 12 hours   Garlic supplements  7 days    Ginkgo biloba  36 hours    Ginseng  24 hours    Heparin (IV)  4 hours 2 hours   Heparin ([place])  12 hours 2 hours   Hydroxychloroquine Plaquenil 11 days    LMW Heparin  24 hours    LMWH  24 hours    NSAIDs  3 days  (Usually not stopped)  Prasugrel Effient 7-10 days 6 hours   Reteplase Retavase 10 days 10 days   Rivaroxaban Xarelto 3 days 6 hours   Streptokinase Streptase 10 days 10 days   Tenecteplase TNKase 10 days 10 days   Thrombolytics  10 days  10 days Avoid x 10 days after inj.  Ticagrelor Brilinta 5-7 days 6 hours   Ticlodipine Ticlid 10-14 days 2 hours   Tinzaparin Innohep 24 hours 4 hours   Tirofiban Aggrastat 8 hours 2 hours   Vitamin E  4 days    Warfarin Coumadin 5 days 2 hours  ____________________________________________________________________________________________  GENERAL RISKS AND COMPLICATIONS  What are the risk, side effects and possible complications? Generally speaking, most procedures are safe.  However, with any procedure there are risks, side effects, and the possibility of complications.  The risks and complications are dependent upon the sites that are lesioned, or the type of nerve block to be performed.  The closer the procedure is to the spine, the more serious the risks are.  Great care is taken when placing the radio frequency needles, block needles or lesioning probes, but sometimes complications can occur. 1. Infection: Any time there is an injection through the skin, there is a risk of infection.  This is why sterile conditions are used for these blocks.  There are four possible types of infection. 1. Localized skin  infection. 2. Central Nervous System Infection-This can be in the form of Meningitis, which can be deadly. 3. Epidural Infections-This can be in the form of an epidural abscess, which can cause pressure inside of the spine, causing compression of the spinal cord with subsequent paralysis. This would require an emergency surgery to decompress, and there are no guarantees that the patient would recover from the paralysis. 4. Discitis-This is an infection of the intervertebral discs.  It occurs in about 1% of discography procedures.  It is difficult to treat and it may lead to surgery.        2. Pain: the needles have to go through skin and soft tissues, will cause soreness.       3. Damage to internal structures:  The nerves to be lesioned may be near blood vessels or    other nerves which can be potentially damaged.       4. Bleeding: Bleeding is more common if the patient is taking blood thinners such as  aspirin, Coumadin, Ticiid, Plavix, etc., or if he/she have some genetic predisposition  such as hemophilia. Bleeding into the spinal canal can cause compression of the spinal  cord with subsequent paralysis.  This would require an emergency surgery to  decompress and there are no guarantees that the patient would recover from the  paralysis.       5. Pneumothorax:  Puncturing of a lung is a possibility, every time a needle is introduced in  the area of the chest or upper back.  Pneumothorax refers to free air around the  collapsed lung(s), inside of the thoracic cavity (chest cavity).  Another two possible  complications related to a similar event would include: Hemothorax and Chylothorax.   These are variations of the Pneumothorax, where instead of air around the collapsed  lung(s), you may have blood or chyle, respectively.       6. Spinal headaches: They may occur with any procedures in the area of the spine.       7. Persistent CSF (Cerebro-Spinal Fluid) leakage: This is a rare problem, but may occur   with prolonged intrathecal or epidural catheters either due to the formation of a fistulous  track or a dural tear.       8. Nerve damage: By working so close to the spinal cord, there is always a possibility of  nerve damage, which could be as serious as a permanent spinal cord injury with  paralysis.       9. Death:  Although rare, severe deadly allergic reactions known as "Anaphylactic  reaction" can occur to any of the medications used.      10. Worsening of the symptoms:  We can always  make thing worse.  What are the chances of something like this happening? Chances of any of this occuring are extremely low.  By statistics, you have more of a chance of getting killed in a motor vehicle accident: while driving to the hospital than any of the above occurring .  Nevertheless, you should be aware that they are possibilities.  In general, it is similar to taking a shower.  Everybody knows that you can slip, hit your head and get killed.  Does that mean that you should not shower again?  Nevertheless always keep in mind that statistics do not mean anything if you happen to be on the wrong side of them.  Even if a procedure has a 1 (one) in a 1,000,000 (million) chance of going wrong, it you happen to be that one..Also, keep in mind that by statistics, you have more of a chance of having something go wrong when taking medications.  Who should not have this procedure? If you are on a blood thinning medication (e.g. Coumadin, Plavix, see list of "Blood Thinners"), or if you have an active infection going on, you should not have the procedure.  If you are taking any blood thinners, please inform your physician.  How should I prepare for this procedure?  Do not eat or drink anything at least six hours prior to the procedure.  Bring a driver with you .  It cannot be a taxi.  Come accompanied by an adult that can drive you back, and that is strong enough to help you if your legs get weak or numb from the  local anesthetic.  Take all of your medicines the morning of the procedure with just enough water to swallow them.  If you have diabetes, make sure that you are scheduled to have your procedure done first thing in the morning, whenever possible.  If you have diabetes, take only half of your insulin dose and notify our nurse that you have done so as soon as you arrive at the clinic.  If you are diabetic, but only take blood sugar pills (oral hypoglycemic), then do not take them on the morning of your procedure.  You may take them after you have had the procedure.  Do not take aspirin or any aspirin-containing medications, at least eleven (11) days prior to the procedure.  They may prolong bleeding.  Wear loose fitting clothing that may be easy to take off and that you would not mind if it got stained with Betadine or blood.  Do not wear any jewelry or perfume  Remove any nail coloring.  It will interfere with some of our monitoring equipment.  NOTE: Remember that this is not meant to be interpreted as a complete list of all possible complications.  Unforeseen problems may occur.  BLOOD THINNERS The following drugs contain aspirin or other products, which can cause increased bleeding during surgery and should not be taken for 2 weeks prior to and 1 week after surgery.  If you should need take something for relief of minor pain, you may take acetaminophen which is found in Tylenol,m Datril, Anacin-3 and Panadol. It is not blood thinner. The products listed below are.  Do not take any of the products listed below in addition to any listed on your instruction sheet.  A.P.C or A.P.C with Codeine Codeine Phosphate Capsules #3 Ibuprofen Ridaura  ABC compound Congesprin Imuran rimadil  Advil Cope Indocin Robaxisal  Alka-Seltzer Effervescent Pain Reliever and Antacid Coricidin or Coricidin-D  Indomethacin Rufen  Alka-Seltzer plus Cold Medicine Cosprin Ketoprofen S-A-C Tablets  Anacin Analgesic  Tablets or Capsules Coumadin Korlgesic Salflex  Anacin Extra Strength Analgesic tablets or capsules CP-2 Tablets Lanoril Salicylate  Anaprox Cuprimine Capsules Levenox Salocol  Anexsia-D Dalteparin Magan Salsalate  Anodynos Darvon compound Magnesium Salicylate Sine-off  Ansaid Dasin Capsules Magsal Sodium Salicylate  Anturane Depen Capsules Marnal Soma  APF Arthritis pain formula Dewitt's Pills Measurin Stanback  Argesic Dia-Gesic Meclofenamic Sulfinpyrazone  Arthritis Bayer Timed Release Aspirin Diclofenac Meclomen Sulindac  Arthritis pain formula Anacin Dicumarol Medipren Supac  Analgesic (Safety coated) Arthralgen Diffunasal Mefanamic Suprofen  Arthritis Strength Bufferin Dihydrocodeine Mepro Compound Suprol  Arthropan liquid Dopirydamole Methcarbomol with Aspirin Synalgos  ASA tablets/Enseals Disalcid Micrainin Tagament  Ascriptin Doan's Midol Talwin  Ascriptin A/D Dolene Mobidin Tanderil  Ascriptin Extra Strength Dolobid Moblgesic Ticlid  Ascriptin with Codeine Doloprin or Doloprin with Codeine Momentum Tolectin  Asperbuf Duoprin Mono-gesic Trendar  Aspergum Duradyne Motrin or Motrin IB Triminicin  Aspirin plain, buffered or enteric coated Durasal Myochrisine Trigesic  Aspirin Suppositories Easprin Nalfon Trillsate  Aspirin with Codeine Ecotrin Regular or Extra Strength Naprosyn Uracel  Atromid-S Efficin Naproxen Ursinus  Auranofin Capsules Elmiron Neocylate Vanquish  Axotal Emagrin Norgesic Verin  Azathioprine Empirin or Empirin with Codeine Normiflo Vitamin E  Azolid Emprazil Nuprin Voltaren  Bayer Aspirin plain, buffered or children's or timed BC Tablets or powders Encaprin Orgaran Warfarin Sodium  Buff-a-Comp Enoxaparin Orudis Zorpin  Buff-a-Comp with Codeine Equegesic Os-Cal-Gesic   Buffaprin Excedrin plain, buffered or Extra Strength Oxalid   Bufferin Arthritis Strength Feldene Oxphenbutazone   Bufferin plain or Extra Strength Feldene Capsules Oxycodone with Aspirin    Bufferin with Codeine Fenoprofen Fenoprofen Pabalate or Pabalate-SF   Buffets II Flogesic Panagesic   Buffinol plain or Extra Strength Florinal or Florinal with Codeine Panwarfarin   Buf-Tabs Flurbiprofen Penicillamine   Butalbital Compound Four-way cold tablets Penicillin   Butazolidin Fragmin Pepto-Bismol   Carbenicillin Geminisyn Percodan   Carna Arthritis Reliever Geopen Persantine   Carprofen Gold's salt Persistin   Chloramphenicol Goody's Phenylbutazone   Chloromycetin Haltrain Piroxlcam   Clmetidine heparin Plaquenil   Cllnoril Hyco-pap Ponstel   Clofibrate Hydroxy chloroquine Propoxyphen         Before stopping any of these medications, be sure to consult the physician who ordered them.  Some, such as Coumadin (Warfarin) are ordered to prevent or treat serious conditions such as "deep thrombosis", "pumonary embolisms", and other heart problems.  The amount of time that you may need off of the medication may also vary with the medication and the reason for which you were taking it.  If you are taking any of these medications, please make sure you notify your pain physician before you undergo any procedures.         Epidural Steroid Injection Patient Information  Description: The epidural space surrounds the nerves as they exit the spinal cord.  In some patients, the nerves can be compressed and inflamed by a bulging disc or a tight spinal canal (spinal stenosis).  By injecting steroids into the epidural space, we can bring irritated nerves into direct contact with a potentially helpful medication.  These steroids act directly on the irritated nerves and can reduce swelling and inflammation which often leads to decreased pain.  Epidural steroids may be injected anywhere along the spine and from the neck to the low back depending upon the location of your pain.   After numbing the skin with local anesthetic (like Novocaine), a small  needle is passed into the epidural space slowly.   You may experience a sensation of pressure while this is being done.  The entire block usually last less than 10 minutes.  Conditions which may be treated by epidural steroids:   Low back and leg pain  Neck and arm pain  Spinal stenosis  Post-laminectomy syndrome  Herpes zoster (shingles) pain  Pain from compression fractures  Preparation for the injection:  1. Do not eat any solid food or dairy products within 8 hours of your appointment.  2. You may drink clear liquids up to 3 hours before appointment.  Clear liquids include water, black coffee, juice or soda.  No milk or cream please. 3. You may take your regular medication, including pain medications, with a sip of water before your appointment  Diabetics should hold regular insulin (if taken separately) and take 1/2 normal NPH dos the morning of the procedure.  Carry some sugar containing items with you to your appointment. 4. A driver must accompany you and be prepared to drive you home after your procedure.  5. Bring all your current medications with your. 6. An IV may be inserted and sedation may be given at the discretion of the physician.   7. A blood pressure cuff, EKG and other monitors will often be applied during the procedure.  Some patients may need to have extra oxygen administered for a short period. 8. You will be asked to provide medical information, including your allergies, prior to the procedure.  We must know immediately if you are taking blood thinners (like Coumadin/Warfarin)  Or if you are allergic to IV iodine contrast (dye). We must know if you could possible be pregnant.  Possible side-effects:  Bleeding from needle site  Infection (rare, may require surgery)  Nerve injury (rare)  Numbness & tingling (temporary)  Difficulty urinating (rare, temporary)  Spinal headache ( a headache worse with upright posture)  Light -headedness (temporary)  Pain at injection site (several days)  Decreased  blood pressure (temporary)  Weakness in arm/leg (temporary)  Pressure sensation in back/neck (temporary)  Call if you experience:  Fever/chills associated with headache or increased back/neck pain.  Headache worsened by an upright position.  New onset weakness or numbness of an extremity below the injection site  Hives or difficulty breathing (go to the emergency room)  Inflammation or drainage at the infection site  Severe back/neck pain  Any new symptoms which are concerning to you  Please note:  Although the local anesthetic injected can often make your back or neck feel good for several hours after the injection, the pain will likely return.  It takes 3-7 days for steroids to work in the epidural space.  You may not notice any pain relief for at least that one week.  If effective, we will often do a series of three injections spaced 3-6 weeks apart to maximally decrease your pain.  After the initial series, we generally will wait several months before considering a repeat injection of the same type.  If you have any questions, please call [phone] [facility]

## 2018-08-04 NOTE — Progress Notes (Signed)
Patient's Name: Joseph Graham  MRN: [id number]  Referring Provider: [name], MD  DOB: 1940/07/13  PCP: [name], MD  DOS: 08/04/2018  Note by: [name], MD  Service setting: Ambulatory outpatient  Specialty: Interventional Pain Management  Location: [facility] (AMB) Pain Management Facility    Patient type: Established   Primary Reason(s) for Visit: Encounter for evaluation before starting new chronic pain management plan of care (Level of risk: moderate) CC: Back Pain (low with coccyx pain today)  HPI  Joseph Graham is a 78 y.o. year old, male patient, who comes today for a follow-up evaluation to review the test results and decide on a treatment plan. He has Personal history of colonic polyps - adenomas; HTN (hypertension); Ex-smoker; Cardiac arrhythmia; Medicare annual wellness visit, subsequent; Benign essential tremor; Diabetes mellitus type 2 with peripheral artery disease ([place]); Benign prostatic hyperplasia; Health maintenance examination; Advanced care planning/counseling discussion; PAD (peripheral artery disease) ([place]); Coronary artery disease involving native coronary artery of native heart without angina pectoris; Primary cancer of bladder ([place]); COPD with hypoxia ([place]); Bilateral lower extremity edema; Malignant neoplasm of right upper lobe of lung ([place]); Peripheral neuropathy due to chemotherapy ([place]); Macular degeneration of both eyes; HLD (hyperlipidemia); DDD (degenerative disc disease), lumbar; Class 1 obesity due to excess calories with serious comorbidity and body mass index (BMI) of 31.0 to 31.9 in adult; Mixed sensory-motor polyneuropathy; Vitamin B12 deficiency; Chronic low back pain (Primary area of Pain) (Bilateral)  (L>R) w/o sciatica; Chronic coccyx pain (Secondary Area of Pain) (Midline); Chronic lower extremity pain (Tertiary Area of Pain) (Bilateral) (R>L); Chronic pain syndrome; Long term current use of opiate analgesic; Pharmacologic therapy; Disorder  of skeletal system; Problems influencing health status; Lumbar facet hypertrophy; Osteoarthritis of facet joint of lumbar spine; Lumbar spondylosis; Lumbar facet syndrome (Bilateral) (L>R); Lumbar lateral recess stenosis; Lumbar foraminal stenosis; and Bertolotti's syndrome on their problem list. His primarily concern today is the Back Pain (low with coccyx pain today)  Pain Assessment: Location: (low back) Coccyx Radiating: all over Onset: More than a month ago Duration: Chronic pain Quality: Pins and needles, Stabbing, Aching, Numbness, Sharp, Constant, Throbbing, Tingling Severity: 6 /10 (subjective, self-reported pain score)  Note: Reported level is inconsistent with clinical observations. Clinically the patient looks like a 3/10 A 3/10 is viewed as "Moderate" and described as significantly interfering with activities of daily living (ADL). It becomes difficult to feed, bathe, get dressed, get on and off the toilet or to perform personal hygiene functions. Difficult to get in and out of bed or a chair without assistance. Very distracting. With effort, it can be ignored when deeply involved in activities. Information on the proper use of the pain scale provided to the patient today. When using our objective Pain Scale, levels between 6 and 10/10 are said to belong in an emergency room, as it progressively worsens from a 6/10, described as severely limiting, requiring emergency care not usually available at an outpatient pain management facility. At a 6/10 level, communication becomes difficult and requires great effort. Assistance to reach the emergency department may be required. Facial flushing and profuse sweating along with potentially dangerous increases in heart rate and blood pressure will be evident. Timing: Constant Modifying factors: rest and medications BP: (!) 150/41  HR: 72  Joseph Graham comes in today for a follow-up visit after his initial evaluation on 07/14/2018. Today we went over  the results of his tests. These were explained in "Layman's terms". During today's appointment we went over  my diagnostic impression, as well as the proposed treatment plan. Referred to [place] by Dr. [name] [facility]).  Primary area of pain is his neuropathy, somewhat controlled by the gabapentin, hydrocodone, and primidone.  According to the patient his secondary area of pain is in his lower back.  He admits that the pain increased after having the MRI of his lower back.  He denies any previous surgery.  He denies any previous interventional therapy.  He has not done any type of activity over the past 2 years secondary to chemo induced neuropathy and the inability to stand for greater than 5 to 10 minutes.  His third area of pain is in his coccyx area (tailbone pain).  He denies any injury.  He admits that this pain has been going on for several years however ignored because there was surgery that would help.  He admits the pain is constant.  He does get relief with sitting on a donut cushion. Denies any previous interventional therapy, physical therapy or recent images.  His third area pain is in his legs (B) (R>L).  He admits the right leg is worse than the left.  He admits that this pain is related to some sciatica but mostly from the peripheral neuropathy again secondary to chemotherapy for lung cancer.  He has a pain that goes down the right leg only on occasions. He describes not having had any sciatic nerve type pain in the last 8 years. He has numbness and pain from his knees to his feet (B) (R>L).  This affects his whole foot.  He admits that he has had Doppler studies in the past.  Has a history of sciatica, only on the right side. No symptoms in the last 8 years.  He admits that he has some neck and shoulder pain but did not want to address these at this time.  He feels like if he gets back active he will feel better.  In considering the treatment plan options, Joseph Graham was  reminded that I no longer take patients for medication management only. I asked him to let me know if he had no intention of taking advantage of the interventional therapies, so that we could make arrangements to provide this space to someone interested. I also made it clear that undergoing interventional therapies for the purpose of getting pain medications is very inappropriate on the part of a patient, and it will not be tolerated in this practice. This type of behavior would suggest true addiction and therefore it requires referral to an addiction specialist.   Further details on both, my assessment(s), as well as the proposed treatment plan, please see below.  Controlled Substance Pharmacotherapy Assessment REMS (Risk Evaluation and Mitigation Strategy)  Analgesic: Hydrocodone/acetaminophen 5/325 mg 1 tablet 3 times daily (last fill date 07/01/2018) hydrocodone 50 mg/day Highest recorded MME/day: 15 mg/day MME/day:15 mg/day  Pill Count: None expected due to no prior prescriptions written by our practice. Hart Rochester, RN  08/04/2018 10:52 AM  Sign when Signing Visit Safety precautions to be maintained throughout the outpatient stay will include: orient to surroundings, keep bed in low position, maintain call bell within reach at all times, provide assistance with transfer out of bed and ambulation.    Pharmacokinetics: Liberation and absorption (onset of action): WNL Distribution (time to peak effect): WNL Metabolism and excretion (duration of action): WNL         Pharmacodynamics: Desired effects: Analgesia: Mr. Sindelar reports >50% benefit. Functional ability: Patient  reports that medication allows him to accomplish basic ADLs Clinically meaningful improvement in function (CMIF): Sustained CMIF goals met Perceived effectiveness: Described as relatively effective, allowing for increase in activities of daily living (ADL) Undesirable effects: Side-effects or Adverse reactions: None  reported Monitoring:  PMP: Online review of the past 3-monthperiod previously conducted. Not applicable at this point since we have not taken over the patient's medication management yet. List of other Serum/Urine Drug Screening Test(s):  No results found. List of all UDS test(s) done:  Lab Results  Component Value Date   SUMMARY FINAL 07/14/2018   Last UDS on record: Summary  Date Value Ref Range Status  07/14/2018 FINAL  Final    Comment:    ==================================================================== TOXASSURE COMP DRUG ANALYSIS,UR ==================================================================== Test                             Result       Flag       Units Drug Present and Declared for Prescription Verification   Hydrocodone                    185          EXPECTED   ng/mg creat   Hydromorphone                  207          EXPECTED   ng/mg creat   Norhydrocodone                 563          EXPECTED   ng/mg creat    Sources of hydrocodone include scheduled prescription    medications. Hydromorphone and norhydrocodone are expected    metabolites of hydrocodone. Hydromorphone is also available as a    scheduled prescription medication.   Primidone                      PRESENT      EXPECTED   Phenobarbital                  PRESENT      EXPECTED    Phenobarbital is an expected metabolite of primidone;    Phenobarbital may also be administered as a prescription drug.   Gabapentin                     PRESENT      EXPECTED   Acetaminophen                  PRESENT      EXPECTED Drug Absent but Declared for Prescription Verification   Metoprolol                     Not Detected UNEXPECTED ==================================================================== Test                      Result    Flag   Units      Ref Range   Creatinine              96               mg/dL      >=20 ==================================================================== Declared  Medications:  The flagging and interpretation on this report are based on the  following declared medications.  Unexpected results may arise from  inaccuracies in the  declared medications.  **Note: The testing scope of this panel includes these medications:  Gabapentin  Hydrocodone (Hydrocodone-Acetaminophen)  Metoprolol (Lopressor)  Primidone (Mysoline)  **Note: The testing scope of this panel does not include small to  moderate amounts of these reported medications:  Acetaminophen  Acetaminophen (Hydrocodone-Acetaminophen)  **Note: The testing scope of this panel does not include following  reported medications:  Cannabidiol  Cholecalciferol  Cyanocobalamin  Lisinopril  Multivitamin  Omeprazole (Prilosec)  Polyethylene Glycol  Vitamin E ==================================================================== For clinical consultation, please call [phone]. ====================================================================    UDS interpretation: No unexpected findings.          Medication Assessment Form: Patient introduced to form today Treatment compliance: Treatment may start today if patient agrees with proposed plan. Evaluation of compliance is not applicable at this point Risk Assessment Profile: Aberrant behavior: See initial evaluations. None observed or detected today Comorbid factors increasing risk of overdose: See initial evaluation. No additional risks detected today Opioid risk tool (ORT):  Opioid Risk  07/14/2018  Alcohol 3  Illegal Drugs 0  Rx Drugs 0  Alcohol 3  Illegal Drugs 0  Rx Drugs 0  Age between 16-45 years  0  History of Preadolescent Sexual Abuse 0  Psychological Disease 0  Depression 0  Opioid Risk Tool Scoring 6  Opioid Risk Interpretation Moderate Risk    ORT Scoring interpretation table:  Score <3 = Low Risk for SUD  Score between 4-7 = Moderate Risk for SUD  Score >8 = High Risk for Opioid Abuse   Risk of substance use disorder  (SUD): Low  Risk Mitigation Strategies:  Patient opioid safety counseling: Completed today. Counseling provided to patient as per "Patient Counseling Document". Document signed by patient, attesting to counseling and understanding Patient-Prescriber Agreement (PPA): Obtained today.  Controlled substance notification to other providers: Written and sent today.  Pharmacologic Plan: Today we may be taking over the patient's pharmacological regimen. See below.             Laboratory Chemistry  Inflammation Markers (CRP: Acute Phase) (ESR: Chronic Phase) Lab Results  Component Value Date   CRP 3 07/14/2018   ESRSEDRATE 51 (H) 07/14/2018                         Rheumatology Markers No results found.  Renal Function Markers Lab Results  Component Value Date   BUN 22 07/14/2018   CREATININE 0.63 (L) 07/14/2018   BCR 35 (H) 07/14/2018   GFRAA 109 07/14/2018   GFRNONAA 94 07/14/2018                             Hepatic Function Markers Lab Results  Component Value Date   AST 19 07/14/2018   ALT 19 07/04/2018   ALBUMIN 4.6 07/14/2018   ALKPHOS 98 07/14/2018                        Electrolytes Lab Results  Component Value Date   NA 141 07/14/2018   K 4.9 07/14/2018   CL 97 07/14/2018   CALCIUM 9.2 07/14/2018   MG 2.0 07/14/2018                        Neuropathy Markers Lab Results  Component Value Date   VITAMINB12 735 07/14/2018   HGBA1C 6.3 07/04/2018  CNS Tests No results found.  Bone Pathology Markers Lab Results  Component Value Date   25OHVITD1 30 07/14/2018   25OHVITD2 <1.0 07/14/2018   25OHVITD3 30 07/14/2018                         Coagulation Parameters Lab Results  Component Value Date   INR 1.10 06/30/2015   LABPROT 14.4 06/30/2015   APTT 30 06/30/2015   PLT 246.0 07/04/2018                        Cardiovascular Markers Lab Results  Component Value Date   HGB 13.4 07/04/2018   HCT 40.8 07/04/2018                          CA Markers No results found.  Endocrine Markers Lab Results  Component Value Date   TSH 0.65 12/31/2013                        Note: Lab results reviewed.  Recent Diagnostic Imaging Review  Lumbosacral Imaging: Lumbar MR wo contrast:  Results for orders placed during the hospital encounter of 06/12/18  MR LUMBAR SPINE WO CONTRAST   Narrative CLINICAL DATA:  Chronic low back pain with pain in the right leg and difficulty walking.  EXAM: MRI LUMBAR SPINE WITHOUT CONTRAST  TECHNIQUE: Multiplanar, multisequence MR imaging of the lumbar spine was performed. No intravenous contrast was administered.  COMPARISON:  None.  FINDINGS: Segmentation: The last open disc space is designated L5-S1. There is a pseudoarticulation between the left L5 transverse process and sacrum.  Alignment: Mild lumbar levoscoliosis. Trace retrolisthesis of L2 on L3.  Vertebrae: No fracture, suspicious osseous lesion, or significant marrow edema.  Conus medullaris and cauda equina: Conus extends to the L1 level. Conus and cauda equina appear normal.  Paraspinal and other soft tissues: 1.3 cm T2 hyperintense lesion in the left kidney, likely a cyst. Minimal left perinephric fluid.  Disc levels:  Disc desiccation throughout the lumbar spine. Severe disc space narrowing at L3-4 and L4-5 with moderate narrowing at L2-3.  T12-L1: Negative.  L1-2: Mild facet arthrosis and at most minimal disc bulging without stenosis.  L2-3: Disc bulging, superimposed shallow right paracentral disc protrusion, and mild facet hypertrophy result in mild right lateral recess and mild right neural foraminal stenosis without significant spinal stenosis.  L3-4: Disc bulging and mild facet hypertrophy result in borderline right lateral recess and mild right neural foraminal stenosis without significant spinal stenosis.  L4-5: Circumferential disc bulging, prominent right paracentral spurring, and mild facet  hypertrophy result in mild right lateral recess stenosis, borderline spinal stenosis, and mild-to-moderate right greater than left neural foraminal stenosis.  L5-S1: Mild facet arthrosis without disc herniation or stenosis.   IMPRESSION: 1. Multilevel disc degeneration greatest at L4-5 where spurring contributes to mild right lateral recess and mild-to-moderate neural foraminal stenosis. 2. Mild lateral recess and neural foraminal stenosis at L2-3 and L3-4.   Electronically Signed   By: Logan Bores M.D.   On: 06/12/2018 08:34    Sacroiliac Joint Imaging: Sacroiliac Joint DG:  Results for orders placed during the hospital encounter of 07/21/18  DG Si Joints   Narrative CLINICAL DATA:  Chronic pain.  EXAM: BILATERAL SACROILIAC JOINTS - 3+ VIEW  COMPARISON:  None.  FINDINGS: SI joints appear normally aligned. No degenerative change or erosion appreciated either  SI joint. There is partial sacralization of the lowest lumbar type vertebral body, with mild degenerative change at the associated pseudoarticulation with the LEFT sacrum. No acute or suspicious osseous finding.  IMPRESSION: 1. SI joints appear normally aligned. No significant degenerative change or erosion appreciated at either SI joint. 2. Partial sacralization of the lowest lumbar type vertebral body on the LEFT, with mild degenerative change at the associated pseudoarticulation with the LEFT sacrum. This can be associated with chronic pain. 3. No acute findings.   Electronically Signed   By: Franki Cabot M.D.   On: 07/21/2018 20:42    Spine Imaging: CT-Guided Biopsy:  Results for orders placed during the hospital encounter of 03/21/15  CT Biopsy   Narrative CLINICAL DATA:  Right upper lobe lung mass.  EXAM: CT GUIDED core BIOPSY OF right upper lobe lung mass.  ANESTHESIA/SEDATION: 1.0  Mg IV Versed; 50 mcg IV Fentanyl  Total Moderate Sedation Time: 45 minutes.  PROCEDURE: The procedure  risks, benefits, and alternatives were explained to the patient. Questions regarding the procedure were encouraged and answered. The patient understands and consents to the procedure.  The right posterior chest was prepped with chlorhexidinein a sterile fashion, and a sterile drape was applied covering the operative field. Sterile gloves were used for the procedure. Local anesthesia was provided with 1% Lidocaine.  Under CT guidance, 17 gauge guiding needle was directed toward lesion in the posterior portion of the right upper lobe. Three core samples were obtained using 18 gauge biopsy needle. The samples were placed in formalin vial and delivered to pathology. However, follow-up imaging demonstrates the patient with large pneumothorax. Decision was made to proceed with chest tube placement.  Complications: Large pneumothorax developed.  FINDINGS: Right upper lobe lung mass is noted.  IMPRESSION: Under CT guidance, percutaneous core biopsy of right upper lobe lung mass was performed. Large pneumothorax developed immediately and chest tube placement will be performed.   Electronically Signed   By: Marijo Conception, M.D.   On: 03/21/2015 13:37    Complexity Note: Imaging results reviewed. Results shared with Mr. Assefa, using Layman's terms.                         Meds   Current Outpatient Medications:  .  Acetaminophen (TYLENOL EXTRA STRENGTH PO), Take by mouth as needed., Disp: , Rfl:  .  Cholecalciferol (VITAMIN D3 PO), Take 5,000 mg by mouth daily., Disp: , Rfl:  .  Cyanocobalamin (B-12 PO), Take 100 mcg by mouth daily. , Disp: , Rfl:  .  gabapentin (NEURONTIN) 300 MG capsule, TAKE 1 CAPSULE IN THE MORNING AND 1 CAPSULE IN AFTERNOON, AND 2 CAPSULES AT NIGHT., Disp: , Rfl:  .  HYDROcodone-acetaminophen (NORCO) 5-325 MG tablet, Take 1 tablet by mouth every 8 (eight) hours as needed for moderate pain. (Patient taking differently: Take 1 tablet by mouth every 8 (eight) hours  as needed for moderate pain. Takes it bid most of the time and occasionally tid. 90 will last 1.5 months.), Disp: 90 tablet, Rfl: 0 .  lisinopril (PRINIVIL,ZESTRIL) 10 MG tablet, TAKE 1 TABLET BY MOUTH EVERY EVENING, Disp: 90 tablet, Rfl: 3 .  metoprolol tartrate (LOPRESSOR) 25 MG tablet, TAKE 1/2 TABLET BY MOUTH TWICE DAILY, Disp: 90 tablet, Rfl: 3 .  Multiple Vitamin (MULTI-VITAMINS) TABS, Take 1 tablet by mouth daily. , Disp: , Rfl:  .  NON FORMULARY, Hemp Oil 5000 mg, Disp: , Rfl:  .  NONFORMULARY  OR COMPOUNDED ITEM, CBD oil oral BID, Disp: , Rfl:  .  omeprazole (PRILOSEC) 20 MG capsule, Take 20 mg by mouth daily., Disp: , Rfl:  .  polyethylene glycol (MIRALAX / GLYCOLAX) packet, Take 17 g by mouth daily., Disp: , Rfl:  .  primidone (MYSOLINE) 50 MG tablet, Take 50 mg by mouth 2 (two) times daily. Takes 2 tablets in the morning and 2 tablets in the evening, Disp: , Rfl: 1 .  VITAMIN E PO, Take 400 g by mouth daily. , Disp: , Rfl:   ROS  Constitutional: Denies any fever or chills Gastrointestinal: No reported hemesis, hematochezia, vomiting, or acute GI distress Musculoskeletal: Denies any acute onset joint swelling, redness, loss of ROM, or weakness Neurological: No reported episodes of acute onset apraxia, aphasia, dysarthria, agnosia, amnesia, paralysis, loss of coordination, or loss of consciousness  Allergies  Mr. Fife has No Known Allergies.  PFSH  Drug: Mr. Watford  reports no history of drug use. Alcohol:  reports no history of alcohol use. Tobacco:  reports that he quit smoking about 3 years ago. His smoking use included cigarettes. He has a 91.50 pack-year smoking history. He has never used smokeless tobacco. Medical:  has a past medical history of Alcohol abuse, in remission, Arthritis, Benign essential tremor, BPH (benign prostatic hypertrophy) (12/30/2012), Cardiac arrhythmia (03/2010), Cataract, Chemotherapy-induced neutropenia ([place]) (09/23/2015), Chronic low back pain,  Colon polyps, Complication of anesthesia, COPD (chronic obstructive pulmonary disease) ([place]), Coronary artery disease, Diabetes mellitus without complication ([place]), Dyslipidemia, GERD (gastroesophageal reflux disease), HTN (hypertension), Macular degeneration, bilateral, Multiple pulmonary nodules (02/2015), Neuromuscular disorder ([place]), Peripheral neuropathy due to chemotherapy ([place]) (12/23/2015), Personal history of tobacco use, presenting hazards to health (02/18/2015), Pneumothorax after biopsy (04/01/2015), Primary cancer of bladder ([place]) (2016), Primary lung cancer ([place]) (2016), and Shortness of breath dyspnea. Surgical: Mr. Iwanicki  has a past surgical history that includes Hemorroidectomy (1982); A flutter ablation (03/2010); Cataract extraction; carotid US (03/2010); Colonoscopy (09/2011); Tonsillectomy (1964); Electormagnetic navigation bronchoscopy (N/A, 03/02/2015); Transurethral resection of bladder tumor (N/A, 04/06/2015); Cystoscopy w/ retrogrades (Bilateral, 04/06/2015); urinary stent removal (04/14/15); Transurethral resection of bladder tumor (N/A, 07/13/2015); Portacath placement (N/A, 08/10/2015); Port-a-cath removal (Right, 04/03/2016); and Polypectomy. Family: family history includes Bladder Cancer (age of onset: 63) in his brother; Cervical cancer (age of onset: 34) in his mother; Hypertension in his father; Stroke (age of onset: 55) in his brother; Tremor in his father.  Constitutional Exam  General appearance: Well nourished, well developed, and well hydrated. In no apparent acute distress Vitals:   08/04/18 1044  BP: (!) 150/41  Pulse: 65  Resp: 18  Temp: 97.7 F (36.5 C)  TempSrc: Oral  SpO2: 94%  Weight: 194 lb (88 kg)  Height: '5\' 8"'  (1.727 m)   BMI Assessment: Estimated body mass index is 29.5 kg/m as calculated from the following:   Height as of this encounter: '5\' 8"'  (1.727 m).   Weight as of this encounter: 194 lb (88 kg).  BMI interpretation table: BMI level Category  Range association with higher incidence of chronic pain  <18 kg/m2 Underweight   18.5-24.9 kg/m2 Ideal body weight   25-29.9 kg/m2 Overweight Increased incidence by 20%  30-34.9 kg/m2 Obese (Class I) Increased incidence by 68%  35-39.9 kg/m2 Severe obesity (Class II) Increased incidence by 136%  >40 kg/m2 Extreme obesity (Class III) Increased incidence by 254%   Patient's current BMI Ideal Body weight  Body mass index is 29.5 kg/m. Ideal body weight: 68.4 kg (150 lb 12.7  oz) Adjusted ideal body weight: 76.2 kg (168 lb 1.2 oz)   BMI Readings from Last 4 Encounters:  08/04/18 29.50 kg/m  07/14/18 29.60 kg/m  07/08/18 31.09 kg/m  07/04/18 30.93 kg/m   Wt Readings from Last 4 Encounters:  08/04/18 194 lb (88 kg)  07/14/18 189 lb (85.7 kg)  07/08/18 198 lb 8 oz (90 kg)  07/04/18 197 lb 8 oz (89.6 kg)  Psych/Mental status: Alert, oriented x 3 (person, place, & time)       Eyes: PERLA Respiratory: Oxygen-dependent COPD  Cervical Spine Area Exam  Skin & Axial Inspection: No masses, redness, edema, swelling, or associated skin lesions Alignment: Symmetrical Functional ROM: Unrestricted ROM      Stability: No instability detected Muscle Tone/Strength: Functionally intact. No obvious neuro-muscular anomalies detected. Sensory (Neurological): Unimpaired Palpation: No palpable anomalies              Upper Extremity (UE) Exam    Side: Right upper extremity  Side: Left upper extremity  Skin & Extremity Inspection: Skin color, temperature, and hair growth are WNL. No peripheral edema or cyanosis. No masses, redness, swelling, asymmetry, or associated skin lesions. No contractures.  Skin & Extremity Inspection: Skin color, temperature, and hair growth are WNL. No peripheral edema or cyanosis. No masses, redness, swelling, asymmetry, or associated skin lesions. No contractures.  Functional ROM: Unrestricted ROM          Functional ROM: Unrestricted ROM          Muscle Tone/Strength:  Functionally intact. No obvious neuro-muscular anomalies detected.  Muscle Tone/Strength: Functionally intact. No obvious neuro-muscular anomalies detected.  Sensory (Neurological): Unimpaired          Sensory (Neurological): Unimpaired          Palpation: No palpable anomalies              Palpation: No palpable anomalies              Provocative Test(s):  Phalen's test: deferred Tinel's test: deferred Apley's scratch test (touch opposite shoulder):  Action 1 (Across chest): deferred Action 2 (Overhead): deferred Action 3 (LB reach): deferred   Provocative Test(s):  Phalen's test: deferred Tinel's test: deferred Apley's scratch test (touch opposite shoulder):  Action 1 (Across chest): deferred Action 2 (Overhead): deferred Action 3 (LB reach): deferred    Thoracic Spine Area Exam  Skin & Axial Inspection: No masses, redness, or swelling Alignment: Symmetrical Functional ROM: Unrestricted ROM Stability: No instability detected Muscle Tone/Strength: Functionally intact. No obvious neuro-muscular anomalies detected. Sensory (Neurological): Unimpaired Muscle strength & Tone: No palpable anomalies  Lumbar Spine Area Exam  Skin & Axial Inspection: No masses, redness, or swelling Alignment: Symmetrical Functional ROM: Minimal ROM affecting both sides Stability: No instability detected Muscle Tone/Strength: Guarding detected Sensory (Neurological): Movement-associated pain Palpation: Complains of area being tender to palpation       Provocative Tests: Hyperextension/rotation test: (+) bilaterally for facet joint pain. Lumbar quadrant test (Kemp's test): (+) bilaterally for facet joint pain. Lateral bending test: deferred today       Patrick's Maneuver: deferred today                   FABER* test: deferred today                   S-I anterior distraction/compression test: deferred today         S-I lateral compression test: deferred today         S-I Thigh-thrust  test: deferred  today         S-I Gaenslen's test: deferred today         *(Flexion, ABduction and External Rotation)  Gait & Posture Assessment  Ambulation: Patient ambulates using a walker Gait: Significantly limited. Dependent on assistive device to ambulate Posture: Antalgic   Lower Extremity Exam    Side: Right lower extremity  Side: Left lower extremity  Stability: No instability observed          Stability: No instability observed          Skin & Extremity Inspection: Skin color, temperature, and hair growth are WNL. No peripheral edema or cyanosis. No masses, redness, swelling, asymmetry, or associated skin lesions. No contractures.  Skin & Extremity Inspection: Skin color, temperature, and hair growth are WNL. No peripheral edema or cyanosis. No masses, redness, swelling, asymmetry, or associated skin lesions. No contractures.  Functional ROM: Unrestricted ROM                  Functional ROM: Unrestricted ROM                  Muscle Tone/Strength: Functionally intact. No obvious neuro-muscular anomalies detected.  Muscle Tone/Strength: Functionally intact. No obvious neuro-muscular anomalies detected.  Sensory (Neurological): Unimpaired        Sensory (Neurological): Unimpaired        DTR: Patellar: deferred today Achilles: deferred today Plantar: deferred today  DTR: Patellar: deferred today Achilles: deferred today Plantar: deferred today  Palpation: No palpable anomalies  Palpation: No palpable anomalies   Assessment & Plan  Primary Diagnosis & Pertinent Problem List: The primary encounter diagnosis was Chronic pain syndrome. Diagnoses of Chronic low back pain (Primary area of Pain) (Bilateral)  (L>R) w/o sciatica, Lumbar spondylosis, Lumbar facet syndrome (Bilateral) (L>R), Lumbar facet hypertrophy, Osteoarthritis of facet joint of lumbar spine, Bertolotti's syndrome, Chronic coccyx pain (Secondary Area of Pain) (Midline), Chronic lower extremity pain (Tertiary Area of Pain) (Bilateral)  (R>L), Lumbar lateral recess stenosis, Lumbar foraminal stenosis, Peripheral neuropathy due to chemotherapy ([place]), Mixed sensory-motor polyneuropathy, and Diabetes mellitus type 2 with peripheral artery disease ([place]) were also pertinent to this visit.  Visit Diagnosis: 1. Chronic pain syndrome   2. Chronic low back pain (Primary area of Pain) (Bilateral)  (L>R) w/o sciatica   3. Lumbar spondylosis   4. Lumbar facet syndrome (Bilateral) (L>R)   5. Lumbar facet hypertrophy   6. Osteoarthritis of facet joint of lumbar spine   7. Bertolotti's syndrome   8. Chronic coccyx pain (Secondary Area of Pain) (Midline)   9. Chronic lower extremity pain (Tertiary Area of Pain) (Bilateral) (R>L)   10. Lumbar lateral recess stenosis   11. Lumbar foraminal stenosis   12. Peripheral neuropathy due to chemotherapy ([place])   13. Mixed sensory-motor polyneuropathy   14. Diabetes mellitus type 2 with peripheral artery disease ([place])    Problems updated and reviewed during this visit: Problem  Bertolotti's Syndrome    Plan of Care  Pharmacotherapy (Medications Ordered): No orders of the defined types were placed in this encounter.   Procedure Orders     Caudal Epidural Injection Lab Orders  No laboratory test(s) ordered today   Imaging Orders  No imaging studies ordered today   Referral Orders  No referral(s) requested today   Pharmacological management options:  Opioid Analgesics: I will not be prescribing any opioids at this time.  The patient indicating having enough. Membrane stabilizer: We have discussed the possibility of optimizing  this mode of therapy, if tolerated Muscle relaxant: We have discussed the possibility of a trial NSAID: We have discussed the possibility of a trial Other analgesic(s): To be determined at a later time   Interventional management options: Planned, scheduled, and/or pending:    Diagnostic ML Caudal ESI #1, under fluoroscopic guidance and IV sedation.    Considering:   Diagnostic coccygeal injection Diagnostic midline lumbar epidural steroid injection Diagnostic sympathetic nerve block Diagnostic bilateral lumbar facet nerve block Possible bilateral lumbar facet radiofrequency ablation Diagnostic bilateral sacroiliac joint injection Possible bilateral sacroiliac joint radiofrequency ablation   PRN Procedures:   None at this time   Provider-requested follow-up: Return for Procedure (w/ sedation): (ML) Caudal ESI #1.  Future Appointments  Date Time Provider [place]  08/07/2018  9:15 AM Milinda Pointer, MD [facility] None  09/16/2018 11:00 AM [facility]  09/16/2018 11:30 AM [facility]  09/19/2018 10:15 AM [facility] None  09/19/2018 10:45 AM Cammie Sickle, MD [facility] None  09/23/2018 10:30 AM Billey Co, MD [facility] None  12/17/2018 10:00 AM Noreene Filbert, MD [facility] None  01/06/2019  9:15 AM [name], MD [facility]  07/10/2019  8:30 AM Eustace Pen, LPN [facility]  08/11/9922  9:30 AM [name], MD [facility]    Primary Care Physician: [name], MD Location: [facility] Note by: [name], MD Date: 08/04/2018; Time: 12:07 PM

## 2018-08-04 NOTE — Progress Notes (Signed)
Safety precautions to be maintained throughout the outpatient stay will include: orient to surroundings, keep bed in low position, maintain call bell within reach at all times, provide assistance with transfer out of bed and ambulation.  

## 2018-08-06 DIAGNOSIS — Q7649 Other congenital malformations of spine, not associated with scoliosis: Secondary | ICD-10-CM | POA: Insufficient documentation

## 2018-08-07 ENCOUNTER — Encounter: Payer: Self-pay | Admitting: Pain Medicine

## 2018-08-07 ENCOUNTER — Ambulatory Visit (HOSPITAL_BASED_OUTPATIENT_CLINIC_OR_DEPARTMENT_OTHER): Payer: Medicare Other | Admitting: Pain Medicine

## 2018-08-07 ENCOUNTER — Ambulatory Visit
Admission: RE | Admit: 2018-08-07 | Discharge: 2018-08-07 | Disposition: A | Discharge status: Home / Self Care | Payer: Medicare Other | Source: Ambulatory Visit | Attending: Pain Medicine | Admitting: Pain Medicine

## 2018-08-07 ENCOUNTER — Other Ambulatory Visit: Payer: Self-pay

## 2018-08-07 VITALS — BP 123/56 | HR 63 | Temp 97.3°F | Resp 17 | Ht 68.0 in | Wt 193.0 lb

## 2018-08-07 DIAGNOSIS — G8929 Other chronic pain: Secondary | ICD-10-CM | POA: Diagnosis not present

## 2018-08-07 DIAGNOSIS — M5137 Other intervertebral disc degeneration, lumbosacral region: Secondary | ICD-10-CM

## 2018-08-07 DIAGNOSIS — M79604 Pain in right leg: Secondary | ICD-10-CM | POA: Insufficient documentation

## 2018-08-07 DIAGNOSIS — M5136 Other intervertebral disc degeneration, lumbar region: Secondary | ICD-10-CM | POA: Diagnosis not present

## 2018-08-07 DIAGNOSIS — M47816 Spondylosis without myelopathy or radiculopathy, lumbar region: Secondary | ICD-10-CM | POA: Diagnosis not present

## 2018-08-07 DIAGNOSIS — M545 Low back pain, unspecified: Secondary | ICD-10-CM

## 2018-08-07 DIAGNOSIS — M79605 Pain in left leg: Secondary | ICD-10-CM | POA: Diagnosis not present

## 2018-08-07 DIAGNOSIS — M533 Sacrococcygeal disorders, not elsewhere classified: Secondary | ICD-10-CM | POA: Insufficient documentation

## 2018-08-07 DIAGNOSIS — M51379 Other intervertebral disc degeneration, lumbosacral region without mention of lumbar back pain or lower extremity pain: Secondary | ICD-10-CM | POA: Insufficient documentation

## 2018-08-07 MED ORDER — TRIAMCINOLONE ACETONIDE 40 MG/ML IJ SUSP
40.0000 mg | Freq: Once | INTRAMUSCULAR | Status: AC
  Administered 2018-08-07: 40 mg
  Filled 2018-08-07: qty 1

## 2018-08-07 MED ORDER — MIDAZOLAM HCL 5 MG/5ML IJ SOLN
1.0000 mg | INTRAMUSCULAR | Status: DC | PRN
  Administered 2018-08-07: 1.5 mg via INTRAVENOUS
  Filled 2018-08-07: qty 5

## 2018-08-07 MED ORDER — LACTATED RINGERS IV SOLN
1000.0000 mL | Freq: Once | INTRAVENOUS | Status: AC
  Administered 2018-08-07: 1000 mL via INTRAVENOUS

## 2018-08-07 MED ORDER — FENTANYL CITRATE (PF) 100 MCG/2ML IJ SOLN
25.0000 ug | INTRAMUSCULAR | Status: DC | PRN
  Administered 2018-08-07: 50 ug via INTRAVENOUS
  Filled 2018-08-07: qty 2

## 2018-08-07 MED ORDER — ROPIVACAINE HCL 2 MG/ML IJ SOLN
2.0000 mL | Freq: Once | INTRAMUSCULAR | Status: AC
  Administered 2018-08-07: 2 mL via EPIDURAL
  Filled 2018-08-07: qty 10

## 2018-08-07 MED ORDER — SODIUM CHLORIDE 0.9% FLUSH
2.0000 mL | Freq: Once | INTRAVENOUS | Status: AC
  Administered 2018-08-07: 2 mL

## 2018-08-07 MED ORDER — IOPAMIDOL (ISOVUE-M 200) INJECTION 41%
10.0000 mL | Freq: Once | INTRAMUSCULAR | Status: AC
  Administered 2018-08-07: 10 mL via EPIDURAL
  Filled 2018-08-07: qty 10

## 2018-08-07 MED ORDER — LIDOCAINE HCL 2 % IJ SOLN
20.0000 mL | Freq: Once | INTRAMUSCULAR | Status: AC
  Administered 2018-08-07: 400 mg
  Filled 2018-08-07: qty 40

## 2018-08-07 NOTE — Patient Instructions (Signed)

## 2018-08-07 NOTE — Progress Notes (Signed)
Patient's Name: Joseph Graham  MRN: [id number]  Referring Provider: [name], MD  DOB: 05/25/1941  PCP: [name], MD  DOS: 08/07/2018  Note by: [name], MD  Service setting: Ambulatory outpatient  Specialty: Interventional Pain Management  Patient type: Established  Location: [facility] (AMB) Pain Management Facility  Visit type: Interventional Procedure   Primary Reason for Visit: Interventional Pain Management Treatment. CC: Back Pain (coccyx)  Procedure:          Anesthesia, Analgesia, Anxiolysis:  Type: Diagnostic Epidural Steroid Injection + Diagnostic Epidurogram #1  Region: Caudal Level: Sacrococcygeal   Laterality: Midline       Type: Moderate (Conscious) Sedation combined with Local Anesthesia Indication(s): Analgesia and Anxiety Route: Intravenous (IV) IV Access: Secured Sedation: Meaningful verbal contact was maintained at all times during the procedure  Local Anesthetic: Lidocaine 1-2%  Position: Prone   Indications: 1. Coccygodynia   2. DDD (degenerative disc disease), lumbosacral   3. Lumbar spondylosis   4. Chronic low back pain (Primary area of Pain) (Bilateral)  (L>R) w/o sciatica   5. Chronic lower extremity pain (Tertiary Area of Pain) (Bilateral) (R>L)    Pain Score: Pre-procedure: 3 /10 Post-procedure: 1 /10  Pre-op Assessment:  Mr. Twist is a 78 y.o. (year old), male patient, seen today for interventional treatment. He  has a past surgical history that includes Hemorroidectomy (1982); A flutter ablation (03/2010); Cataract extraction; carotid US (03/2010); Colonoscopy (09/2011); Tonsillectomy (1964); Electormagnetic navigation bronchoscopy (N/A, 03/02/2015); Transurethral resection of bladder tumor (N/A, 04/06/2015); Cystoscopy w/ retrogrades (Bilateral, 04/06/2015); urinary stent removal (04/14/15); Transurethral resection of bladder tumor (N/A, 07/13/2015); Portacath placement (N/A, 08/10/2015); Port-a-cath removal (Right, 04/03/2016); and  Polypectomy. [name] has a current medication list which includes the following prescription(s): acetaminophen, cholecalciferol, cyanocobalamin, gabapentin, hydrocodone-acetaminophen, lisinopril, metoprolol tartrate, multi-vitamins, NON FORMULARY, NONFORMULARY OR COMPOUNDED ITEM, omeprazole, polyethylene glycol, primidone, and vitamin e, and the following Facility-Administered Medications: fentanyl and midazolam. His primarily concern today is the Back Pain (coccyx)  Initial Vital Signs:  Pulse/HCG Rate: 63ECG Heart Rate: 60 Temp: 97.8 F (36.6 C) Resp: 18 BP: (!) 143/53 SpO2: 95 %  BMI: Estimated body mass index is 29.35 kg/m as calculated from the following:   Height as of this encounter: 5\' 8"  (1.727 m).   Weight as of this encounter: 193 lb (87.5 kg).  Risk Assessment: Allergies: Reviewed. He has No Known Allergies.  Allergy Precautions: None required Coagulopathies: Reviewed. None identified.  Blood-thinner therapy: None at this time Active Infection(s): Reviewed. None identified. [name] is afebrile  Site Confirmation: [name] was asked to confirm the procedure and laterality before marking the site Procedure checklist: Completed Consent: Before the procedure and under the influence of no sedative(s), amnesic(s), or anxiolytics, the patient was informed of the treatment options, risks and possible complications. To fulfill our ethical and legal obligations, as recommended by the American Medical Association's Code of Ethics, I have informed the patient of my clinical impression; the nature and purpose of the treatment or procedure; the risks, benefits, and possible complications of the intervention; the alternatives, including doing nothing; the risk(s) and benefit(s) of the alternative treatment(s) or procedure(s); and the risk(s) and benefit(s) of doing nothing. The patient was provided information about the general risks and possible complications associated with the  procedure. These may include, but are not limited to: failure to achieve desired goals, infection, bleeding, organ or nerve damage, allergic reactions, paralysis, and death. In addition, the patient was informed of those risks and complications  associated to Spine-related procedures, such as failure to decrease pain; infection (i.e.: Meningitis, epidural or intraspinal abscess); bleeding (i.e.: epidural hematoma, subarachnoid hemorrhage, or any other type of intraspinal or peri-dural bleeding); organ or nerve damage (i.e.: Any type of peripheral nerve, nerve root, or spinal cord injury) with subsequent damage to sensory, motor, and/or autonomic systems, resulting in permanent pain, numbness, and/or weakness of one or several areas of the body; allergic reactions; (i.e.: anaphylactic reaction); and/or death. Furthermore, the patient was informed of those risks and complications associated with the medications. These include, but are not limited to: allergic reactions (i.e.: anaphylactic or anaphylactoid reaction(s)); adrenal axis suppression; blood sugar elevation that in diabetics may result in ketoacidosis or comma; water retention that in patients with history of congestive heart failure may result in shortness of breath, pulmonary edema, and decompensation with resultant heart failure; weight gain; swelling or edema; medication-induced neural toxicity; particulate matter embolism and blood vessel occlusion with resultant organ, and/or nervous system infarction; and/or aseptic necrosis of one or more joints. Finally, the patient was informed that Medicine is not an exact science; therefore, there is also the possibility of unforeseen or unpredictable risks and/or possible complications that may result in a catastrophic outcome. The patient indicated having understood very clearly. We have given the patient no guarantees and we have made no promises. Enough time was given to the patient to ask questions, all of  which were answered to the patient's satisfaction. [name] has indicated that he wanted to continue with the procedure. Attestation: I, the ordering provider, attest that I have discussed with the patient the benefits, risks, side-effects, alternatives, likelihood of achieving goals, and potential problems during recovery for the procedure that I have provided informed consent. Date  Time: 08/07/2018  8:18 AM  Pre-Procedure Preparation:  Monitoring: As per clinic protocol. Respiration, ETCO2, SpO2, BP, heart rate and rhythm monitor placed and checked for adequate function Safety Precautions: Patient was assessed for positional comfort and pressure points before starting the procedure. Time-out: I initiated and conducted the "Time-out" before starting the procedure, as per protocol. The patient was asked to participate by confirming the accuracy of the "Time Out" information. Verification of the correct person, site, and procedure were performed and confirmed by me, the nursing staff, and the patient. "Time-out" conducted as per Joint Commission's Universal Protocol (UP.01.01.01). Time: 0941  Description of Procedure:          Target Area: Caudal Epidural Canal. Approach: Midline approach. Area Prepped: Entire Posterior Sacrococcygeal Region Prepping solution: ChloraPrep (2% chlorhexidine gluconate and 70% isopropyl alcohol) Safety Precautions: Aspiration looking for blood return was conducted prior to all injections. At no point did we inject any substances, as a needle was being advanced. No attempts were made at seeking any paresthesias. Safe injection practices and needle disposal techniques used. Medications properly checked for expiration dates. SDV (single dose vial) medications used. Description of the Procedure: Protocol guidelines were followed. The patient was placed in position over the fluoroscopy table. The target area was identified and the area prepped in the usual manner. Skin &  deeper tissues infiltrated with local anesthetic. Appropriate amount of time allowed to pass for local anesthetics to take effect. The procedure needles were then advanced to the target area. Proper needle placement secured. Negative aspiration confirmed. Solution injected in intermittent fashion, asking for systemic symptoms every 0.5cc of injectate. The needles were then removed and the area cleansed, making sure to leave some of the prepping solution back to take  advantage of its long term bactericidal properties. Vitals:   08/07/18 0950 08/07/18 1000 08/07/18 1010 08/07/18 1020  BP: (!) 103/50 (!) 111/56 (!) 109/47 (!) 123/56  Pulse: (!) 58 63    Resp:    17  Temp:  (!) 97.3 F (36.3 C)    SpO2: 98% 97% 97% 97%  Weight:      Height:        Start Time: 0941 hrs. End Time: 0949 hrs. Materials:  Needle(s) Type: Epidural needle Gauge: 17G Length: 3.5-in Medication(s): Please see orders for medications and dosing details.  Imaging Guidance (Spinal):          Type of Imaging Technique: Fluoroscopy Guidance (Spinal) Indication(s): Assistance in needle guidance and placement for procedures requiring needle placement in or near specific anatomical locations not easily accessible without such assistance. Exposure Time: Please see nurses notes. Contrast: Before injecting any contrast, we confirmed that the patient did not have an allergy to iodine, shellfish, or radiological contrast. Once satisfactory needle placement was completed at the desired level, radiological contrast was injected. Contrast injected under live fluoroscopy. No contrast complications. See chart for type and volume of contrast used. Fluoroscopic Guidance: I was personally present during the use of fluoroscopy. "Tunnel Vision Technique" used to obtain the best possible view of the target area. Parallax error corrected before commencing the procedure. "Direction-depth-direction" technique used to introduce the needle under  continuous pulsed fluoroscopy. Once target was reached, antero-posterior, oblique, and lateral fluoroscopic projection used confirm needle placement in all planes. Images permanently stored in EMR. Interpretation: I personally interpreted the imaging intraoperatively. Adequate needle placement confirmed in multiple planes. Appropriate spread of contrast into desired area was observed. No evidence of afferent or efferent intravascular uptake. No intrathecal or subarachnoid spread observed. Permanent images saved into the patient's record.  Diagnostic Epidurogram:  Contrast: Before injecting any contrast, we confirmed that the patient did not have an allergy to iodine, shellfish, or radiological contrast. For accuracy purposes, contrast was injected under live fluoroscopy. Study personally interpreted intraoparatively. Type: Non-ionic, water soluble, hypoallergenic, myelogram-compatible, radiological contrast used. Please see orders and nurses note for specific choice of contrast. Volume: Please see nurses note for injected volume.  Observations:  Spinal Alignment: Adequate       Vertebral body: Intact Lamina: Intact Disc: Disc hight preserved Facet: Within Normal Limits.        Hardware: None  Spread: Appropriate epidural spread of contrast Anterior: Adequate Posterior: Adequate Superior (cephalad): Adequate Inferior (caudad): Adequate Right lateral: Adequate Left lateral: Adequate Plica medialis dorsalis: Medially aligned Nerve root(s): Adequate  Epidural extravasation: None observed Intrathecal: No intrathecal spread identified Subarachnoid: No subarachnoid spread pattern observed Vascular: No evidence of afferent or efferent intravascular uptake  Impression: Technically successful epidurogram. See above for details Note: Hard copies saved to EMR.  Antibiotic Prophylaxis:   Anti-infectives (From admission, onward)   None     Indication(s): None identified  Post-operative  Assessment:  Post-procedure Vital Signs:  Pulse/HCG Rate: 63(!) 59 Temp: (!) 97.3 F (36.3 C) Resp: 17 BP: (!) 123/56 SpO2: 97 %  EBL: None  Complications: No immediate post-treatment complications observed by team, or reported by patient.  Note: The patient tolerated the entire procedure well. A repeat set of vitals were taken after the procedure and the patient was kept under observation following institutional policy, for this type of procedure. Post-procedural neurological assessment was performed, showing return to baseline, prior to discharge. The patient was provided with post-procedure discharge instructions, including a  section on how to identify potential problems. Should any problems arise concerning this procedure, the patient was given instructions to immediately contact us, at any time, without hesitation. In any case, we plan to contact the patient by telephone for a follow-up status report regarding this interventional procedure.  Comments:  No additional relevant information.  Plan of Care    Imaging Orders     DG C-Arm 1-60 Min-No Report  Procedure Orders     Caudal Epidural Injection  Medications ordered for procedure: Meds ordered this encounter  Medications  . iopamidol (ISOVUE-M) 41 % intrathecal injection 10 mL    Must be Myelogram-compatible. If not available, you may substitute with a water-soluble, non-ionic, hypoallergenic, myelogram-compatible radiological contrast medium.  Marland Kitchen lidocaine (XYLOCAINE) 2 % (with pres) injection 400 mg  . midazolam (VERSED) 5 MG/5ML injection 1-2 mg    Make sure Flumazenil is available in the pyxis when using this medication. If oversedation occurs, administer 0.2 mg IV over 15 sec. If after 45 sec no response, administer 0.2 mg again over 1 min; may repeat at 1 min intervals; not to exceed 4 doses (1 mg)  . fentaNYL (SUBLIMAZE) injection 25-50 mcg    Make sure Narcan is available in the pyxis when using this medication. In  the event of respiratory depression (RR< 8/min): Titrate NARCAN (naloxone) in increments of 0.1 to 0.2 mg IV at 2-3 minute intervals, until desired degree of reversal.  . lactated ringers infusion 1,000 mL  . sodium chloride flush (NS) 0.9 % injection 2 mL  . ropivacaine (PF) 2 mg/mL (0.2%) (NAROPIN) injection 2 mL  . triamcinolone acetonide (KENALOG-40) injection 40 mg   Medications administered: We administered iopamidol, lidocaine, midazolam, fentaNYL, lactated ringers, sodium chloride flush, ropivacaine (PF) 2 mg/mL (0.2%), and triamcinolone acetonide.  See the medical record for exact dosing, route, and time of administration.  Disposition: Discharge home  Discharge Date & Time: 08/07/2018; 1027 hrs.   Physician-requested Follow-up: Return for PPE (2 wks), w/ Dr. Dossie Arbour.  Future Appointments  Date Time Provider [place]  08/20/2018  1:15 PM Milinda Pointer, MD [facility] None  09/16/2018 11:00 AM [facility]  09/16/2018 11:30 AM [facility]  09/19/2018 10:15 AM [facility] None  09/19/2018 10:45 AM Cammie Sickle, MD [facility] None  09/23/2018 10:30 AM Billey Co, MD [facility] None  12/17/2018 10:00 AM Noreene Filbert, MD [facility] None  01/06/2019  9:15 AM [name], MD [facility]  07/10/2019  8:30 AM Eustace Pen, LPN [facility]  09/14/5033  9:30 AM [name], MD [facility]   Primary Care Physician: [name], MD Location: [facility] Note by: [name], MD Date: 08/07/2018; Time: 10:28 AM  Disclaimer:  Medicine is not an Chief Strategy Officer. The only guarantee in medicine is that nothing is guaranteed. It is important to note that the decision to proceed with this intervention was based on the information collected from the patient. The Data and conclusions were drawn from the patient's questionnaire, the interview, and the physical examination.  Because the information was provided in large part by the patient, it cannot be guaranteed that it has not been purposely or unconsciously manipulated. Every effort has been made to obtain as much relevant data as possible for this evaluation. It is important to note that the conclusions that lead to this procedure are derived in large part from the available data. Always take into account that the treatment will also be  dependent on availability of resources and existing treatment guidelines, considered by other Pain Management Practitioners as being common knowledge and practice, at the time of the intervention. For Medico-Legal purposes, it is also important to point out that variation in procedural techniques and pharmacological choices are the acceptable norm. The indications, contraindications, technique, and results of the above procedure should only be interpreted and judged by a Board-Certified Interventional Pain Specialist with extensive familiarity and expertise in the same exact procedure and technique.

## 2018-08-07 NOTE — Progress Notes (Signed)
Safety precautions to be maintained throughout the outpatient stay will include: orient to surroundings, keep bed in low position, maintain call bell within reach at all times, provide assistance with transfer out of bed and ambulation.  

## 2018-08-08 ENCOUNTER — Encounter: Payer: Self-pay | Admitting: Pain Medicine

## 2018-08-08 ENCOUNTER — Telehealth: Payer: Self-pay

## 2018-08-08 ENCOUNTER — Encounter: Payer: Self-pay | Admitting: Family Medicine

## 2018-08-08 ENCOUNTER — Encounter: Payer: Self-pay | Admitting: Internal Medicine

## 2018-08-08 NOTE — Telephone Encounter (Signed)
Post procedure phone call.  Patient states he is feeling better all over.

## 2018-08-17 NOTE — Progress Notes (Signed)
Patient's Name: Joseph Graham  MRN: [id number]  Referring Provider: [name], MD  DOB: May 19, 1941  PCP: [name], MD  DOS: 08/20/2018  Note by: [name], MD  Service setting: Ambulatory outpatient  Specialty: Interventional Pain Management  Location: [facility] (AMB) Pain Management Facility    Patient type: Established   Primary Reason(s) for Visit: Encounter for post-procedure evaluation of chronic illness with mild to moderate exacerbation CC: Back Pain  HPI  Joseph Graham is a 78 y.o. year old, male patient, who comes today for a post-procedure evaluation. He has Personal history of colonic polyps - adenomas; HTN (hypertension); Ex-smoker; Cardiac arrhythmia; Medicare annual wellness visit, subsequent; Benign essential tremor; Diabetes mellitus type 2 with peripheral artery disease ([place]); Benign prostatic hyperplasia; Health maintenance examination; Advanced care planning/counseling discussion; PAD (peripheral artery disease) ([place]); Coronary artery disease involving native coronary artery of native heart without angina pectoris; Primary cancer of bladder ([place]); COPD with hypoxia ([place]); Bilateral lower extremity edema; Malignant neoplasm of right upper lobe of lung ([place]); Peripheral neuropathy due to chemotherapy ([place]); Macular degeneration of both eyes; HLD (hyperlipidemia); DDD (degenerative disc disease), lumbar; Class 1 obesity due to excess calories with serious comorbidity and body mass index (BMI) of 31.0 to 31.9 in adult; Mixed sensory-motor polyneuropathy; Vitamin B12 deficiency; Chronic low back pain (Primary area of Pain) (Bilateral)  (L>R) w/o sciatica; Chronic coccyx pain (Secondary Area of Pain) (Midline); Chronic lower extremity pain (Tertiary Area of Pain) (Bilateral) (R>L); Chronic pain syndrome; Long term current use of opiate analgesic; Pharmacologic therapy; Disorder of skeletal system; Problems influencing health status; Lumbar facet hypertrophy; Osteoarthritis  of facet joint of lumbar spine; Lumbar spondylosis; Lumbar facet syndrome (Bilateral) (L>R); Lumbar lateral recess stenosis; Lumbar foraminal stenosis; Bertolotti's syndrome; DDD (degenerative disc disease), lumbosacral; Coccygodynia; and Abnormal MRI, lumbar spine (06/12/2018) on their problem list. His primarily concern today is the Back Pain  Pain Assessment: Location:   Coccyx Radiating: pain is all over Onset: More than a month ago Duration: Chronic pain Quality: Aching Severity: 1 /10 (subjective, self-reported pain score)  Note: Reported level is compatible with observation.                               Effect on ADL: limits my daily activities Timing: Constant Modifying factors: rest, procedures, meds BP: 136/61  HR: 65  Joseph Graham comes in today for post-procedure evaluation.  Further details on both, my assessment(s), as well as the proposed treatment plan, please see below.  Post-Procedure Assessment  08/07/2018 Procedure: Diagnostic midline caudal ESI + epidurogram #1, under fluoroscopic guidance and IV sedation. Pre-procedure pain score:  3/10 Post-procedure pain score: 1/10 (> 50% relief) Influential Factors: BMI: 29.35 kg/m Intra-procedural challenges: None observed.         Assessment challenges: None detected.              Reported side-effects: None.        Post-procedural adverse reactions or complications: None reported         Sedation: Sedation provided. When no sedatives are used, the analgesic levels obtained are directly associated to the effectiveness of the local anesthetics. However, when sedation is provided, the level of analgesia obtained during the initial 1 hour following the intervention, is believed to be the result of a combination of factors. These factors may include, but are not limited to: 1. The effectiveness of the local anesthetics used. 2. The effects of the  analgesic(s) and/or anxiolytic(s) used. 3. The degree of discomfort experienced by  the patient at the time of the procedure. 4. The patients ability and reliability in recalling and recording the events. 5. The presence and influence of possible secondary gains and/or psychosocial factors. Reported result: Relief experienced during the 1st hour after the procedure: 100 % (Ultra-Short Term Relief)            Interpretative annotation: Clinically appropriate result. Analgesia during this period is likely to be Local Anesthetic and/or IV Sedative (Analgesic/Anxiolytic) related.          Effects of local anesthetic: The analgesic effects attained during this period are directly associated to the localized infiltration of local anesthetics and therefore cary significant diagnostic value as to the etiological location, or anatomical origin, of the pain. Expected duration of relief is directly dependent on the pharmacodynamics of the local anesthetic used. Long-acting (4-6 hours) anesthetics used.  Reported result: Relief during the next 4 to 6 hour after the procedure: 100 % (Short-Term Relief)            Interpretative annotation: Clinically appropriate result. Analgesia during this period is likely to be Local Anesthetic-related.          Long-term benefit: Defined as the period of time past the expected duration of local anesthetics (1 hour for short-acting and 4-6 hours for long-acting). With the possible exception of prolonged sympathetic blockade from the local anesthetics, benefits during this period are typically attributed to, or associated with, other factors such as analgesic sensory neuropraxia, antiinflammatory effects, or beneficial biochemical changes provided by agents other than the local anesthetics.  Reported result: Extended relief following procedure: 100 %(ongoing) (Long-Term Relief)            Interpretative annotation: Clinically possible results. Good relief. No permanent benefit expected. Inflammation plays a part in the etiology to the pain.          Current  benefits: Defined as reported results that persistent at this point in time.   Analgesia: 100 % Joseph Graham reports improvement of axial and extremity symptoms. Function: Joseph Graham reports improvement in function ROM: Joseph Graham reports improvement in ROM Interpretative annotation: Ongoing benefit. Therapeutic benefit observed. Effective therapeutic approach. Benefit could be steroid-related.  Interpretation: Results would suggest a successful diagnostic intervention.                  Plan:  Set up procedure as a PRN palliative treatment option for this patient.                Laboratory Chemistry  Inflammation Markers (CRP: Acute Phase) (ESR: Chronic Phase) Lab Results  Component Value Date   CRP 3 07/14/2018   ESRSEDRATE 51 (H) 07/14/2018                         Renal Markers Lab Results  Component Value Date   BUN 22 07/14/2018   CREATININE 0.63 (L) 07/14/2018   BCR 35 (H) 07/14/2018   GFRAA 109 07/14/2018   GFRNONAA 94 07/14/2018                             Hepatic Markers Lab Results  Component Value Date   AST 19 07/14/2018   ALT 19 07/04/2018   ALBUMIN 4.6 07/14/2018  Note: Lab results reviewed.  Recent Imaging Results   Results for orders placed in visit on 08/07/18  DG C-Arm 1-60 Min-No Report   Narrative Fluoroscopy was utilized by the requesting physician.  No radiographic  interpretation.              Interpretation Report: Fluoroscopy was used during the procedure to assist with needle guidance. The images were interpreted intraoperatively by the requesting physician.        Meds   Current Outpatient Medications:  .  Acetaminophen (TYLENOL EXTRA STRENGTH PO), Take by mouth as needed., Disp: , Rfl:  .  Cholecalciferol (VITAMIN D3 PO), Take 5,000 mg by mouth daily., Disp: , Rfl:  .  Cyanocobalamin (B-12 PO), Take 100 mcg by mouth daily. , Disp: , Rfl:  .  gabapentin (NEURONTIN) 300 MG capsule, TAKE 1 CAPSULE IN THE MORNING  AND 1 CAPSULE IN AFTERNOON, AND 2 CAPSULES AT NIGHT., Disp: , Rfl:  .  HYDROcodone-acetaminophen (NORCO) 5-325 MG tablet, Take 1 tablet by mouth every 8 (eight) hours as needed for moderate pain., Disp: 90 tablet, Rfl: 0 .  lisinopril (PRINIVIL,ZESTRIL) 10 MG tablet, TAKE 1 TABLET BY MOUTH EVERY EVENING, Disp: 90 tablet, Rfl: 3 .  metoprolol tartrate (LOPRESSOR) 25 MG tablet, TAKE 1/2 TABLET BY MOUTH TWICE DAILY, Disp: 90 tablet, Rfl: 3 .  Multiple Vitamin (MULTI-VITAMINS) TABS, Take 1 tablet by mouth daily. , Disp: , Rfl:  .  NON FORMULARY, Hemp Oil 5000 mg, Disp: , Rfl:  .  NONFORMULARY OR COMPOUNDED ITEM, CBD oil oral BID, Disp: , Rfl:  .  omeprazole (PRILOSEC) 20 MG capsule, Take 20 mg by mouth daily., Disp: , Rfl:  .  polyethylene glycol (MIRALAX / GLYCOLAX) packet, Take 17 g by mouth daily., Disp: , Rfl:  .  primidone (MYSOLINE) 50 MG tablet, Take 50 mg by mouth 2 (two) times daily. Takes 2 tablets in the morning and 2 tablets in the evening, Disp: , Rfl: 1 .  VITAMIN E PO, Take 400 g by mouth daily. , Disp: , Rfl:   ROS  Constitutional: Denies any fever or chills Gastrointestinal: No reported hemesis, hematochezia, vomiting, or acute GI distress Musculoskeletal: Denies any acute onset joint swelling, redness, loss of ROM, or weakness Neurological: No reported episodes of acute onset apraxia, aphasia, dysarthria, agnosia, amnesia, paralysis, loss of coordination, or loss of consciousness  Allergies  Joseph Graham has No Known Allergies.  PFSH  Drug: Joseph Graham  reports no history of drug use. Alcohol:  reports no history of alcohol use. Tobacco:  reports that he quit smoking about 3 years ago. His smoking use included cigarettes. He has a 91.50 pack-year smoking history. He has never used smokeless tobacco. Medical:  has a past medical history of Alcohol abuse, in remission, Arthritis, Benign essential tremor, BPH (benign prostatic hypertrophy) (12/30/2012), Cardiac arrhythmia (03/2010),  Cataract, Chemotherapy-induced neutropenia ([place]) (09/23/2015), Chronic low back pain, Colon polyps, Complication of anesthesia, COPD (chronic obstructive pulmonary disease) ([place]), Coronary artery disease, Diabetes mellitus without complication ([place]), Dyslipidemia, GERD (gastroesophageal reflux disease), HTN (hypertension), Macular degeneration, bilateral, Multiple pulmonary nodules (02/2015), Neuromuscular disorder ([place]), Peripheral neuropathy due to chemotherapy ([place]) (12/23/2015), Personal history of tobacco use, presenting hazards to health (02/18/2015), Pneumothorax after biopsy (04/01/2015), Primary cancer of bladder ([place]) (2016), Primary lung cancer ([place]) (2016), and Shortness of breath dyspnea. Surgical: Joseph Graham  has a past surgical history that includes Hemorroidectomy (1982); A flutter ablation (03/2010); Cataract extraction; carotid US (03/2010); Colonoscopy (09/2011); Tonsillectomy (1964); Electormagnetic  navigation bronchoscopy (N/A, 03/02/2015); Transurethral resection of bladder tumor (N/A, 04/06/2015); Cystoscopy w/ retrogrades (Bilateral, 04/06/2015); urinary stent removal (04/14/15); Transurethral resection of bladder tumor (N/A, 07/13/2015); Portacath placement (N/A, 08/10/2015); Port-a-cath removal (Right, 04/03/2016); and Polypectomy. Family: family history includes Bladder Cancer (age of onset: 67) in his brother; Cervical cancer (age of onset: 85) in his mother; Hypertension in his father; Stroke (age of onset: 45) in his brother; Tremor in his father.  Constitutional Exam  General appearance: Well nourished, well developed, and well hydrated. In no apparent acute distress Vitals:   08/20/18 1321  BP: 136/61  Pulse: 65  Temp: 98.1 F (36.7 C)  SpO2: 97%  Weight: 193 lb (87.5 kg)  Height: '5\' 8"'  (1.727 m)   BMI Assessment: Estimated body mass index is 29.35 kg/m as calculated from the following:   Height as of this encounter: '5\' 8"'  (1.727 m).   Weight as of this encounter: 193 lb (87.5  kg).  BMI interpretation table: BMI level Category Range association with higher incidence of chronic pain  <18 kg/m2 Underweight   18.5-24.9 kg/m2 Ideal body weight   25-29.9 kg/m2 Overweight Increased incidence by 20%  30-34.9 kg/m2 Obese (Class I) Increased incidence by 68%  35-39.9 kg/m2 Severe obesity (Class II) Increased incidence by 136%  >40 kg/m2 Extreme obesity (Class III) Increased incidence by 254%   Patient's current BMI Ideal Body weight  Body mass index is 29.35 kg/m. Ideal body weight: 68.4 kg (150 lb 12.7 oz) Adjusted ideal body weight: 76.1 kg (167 lb 10.8 oz)   BMI Readings from Last 4 Encounters:  08/20/18 29.35 kg/m  08/07/18 29.35 kg/m  08/04/18 29.50 kg/m  07/14/18 29.60 kg/m   Wt Readings from Last 4 Encounters:  08/20/18 193 lb (87.5 kg)  08/07/18 193 lb (87.5 kg)  08/04/18 194 lb (88 kg)  07/14/18 189 lb (85.7 kg)  Psych/Mental status: Alert, oriented x 3 (person, place, & time)       Eyes: PERLA Respiratory: No evidence of acute respiratory distress  Cervical Spine Area Exam  Skin & Axial Inspection: No masses, redness, edema, swelling, or associated skin lesions Alignment: Symmetrical Functional ROM: Unrestricted ROM      Stability: No instability detected Muscle Tone/Strength: Functionally intact. No obvious neuro-muscular anomalies detected. Sensory (Neurological): Unimpaired Palpation: No palpable anomalies              Upper Extremity (UE) Exam    Side: Right upper extremity  Side: Left upper extremity  Skin & Extremity Inspection: Skin color, temperature, and hair growth are WNL. No peripheral edema or cyanosis. No masses, redness, swelling, asymmetry, or associated skin lesions. No contractures.  Skin & Extremity Inspection: Skin color, temperature, and hair growth are WNL. No peripheral edema or cyanosis. No masses, redness, swelling, asymmetry, or associated skin lesions. No contractures.  Functional ROM: Unrestricted ROM           Functional ROM: Unrestricted ROM          Muscle Tone/Strength: Functionally intact. No obvious neuro-muscular anomalies detected.  Muscle Tone/Strength: Functionally intact. No obvious neuro-muscular anomalies detected.  Sensory (Neurological): Unimpaired          Sensory (Neurological): Unimpaired          Palpation: No palpable anomalies              Palpation: No palpable anomalies              Provocative Test(s):  Phalen's test: deferred Tinel's test: deferred Apley's  scratch test (touch opposite shoulder):  Action 1 (Across chest): deferred Action 2 (Overhead): deferred Action 3 (LB reach): deferred   Provocative Test(s):  Phalen's test: deferred Tinel's test: deferred Apley's scratch test (touch opposite shoulder):  Action 1 (Across chest): deferred Action 2 (Overhead): deferred Action 3 (LB reach): deferred    Thoracic Spine Area Exam  Skin & Axial Inspection: No masses, redness, or swelling Alignment: Symmetrical Functional ROM: Unrestricted ROM Stability: No instability detected Muscle Tone/Strength: Functionally intact. No obvious neuro-muscular anomalies detected. Sensory (Neurological): Unimpaired Muscle strength & Tone: No palpable anomalies  Lumbar Spine Area Exam  Skin & Axial Inspection: No masses, redness, or swelling Alignment: Symmetrical Functional ROM: Unrestricted ROM       Stability: No instability detected Muscle Tone/Strength: Functionally intact. No obvious neuro-muscular anomalies detected. Sensory (Neurological): Unimpaired Palpation: No palpable anomalies       Provocative Tests: Hyperextension/rotation test: deferred today       Lumbar quadrant test (Kemp's test): deferred today       Lateral bending test: deferred today       Patrick's Maneuver: deferred today                   FABER* test: deferred today                   S-I anterior distraction/compression test: deferred today         S-I lateral compression test: deferred today           S-I Thigh-thrust test: deferred today         S-I Gaenslen's test: deferred today         *(Flexion, ABduction and External Rotation)  Gait & Posture Assessment  Ambulation: Patient ambulates using a walker Gait: Significantly limited. Dependent on assistive device to ambulate Posture: Antalgic   Lower Extremity Exam    Side: Right lower extremity  Side: Left lower extremity  Stability: No instability observed          Stability: No instability observed          Skin & Extremity Inspection: Skin color, temperature, and hair growth are WNL. No peripheral edema or cyanosis. No masses, redness, swelling, asymmetry, or associated skin lesions. No contractures.  Skin & Extremity Inspection: Skin color, temperature, and hair growth are WNL. No peripheral edema or cyanosis. No masses, redness, swelling, asymmetry, or associated skin lesions. No contractures.  Functional ROM: Unrestricted ROM                  Functional ROM: Unrestricted ROM                  Muscle Tone/Strength: Functionally intact. No obvious neuro-muscular anomalies detected.  Muscle Tone/Strength: Functionally intact. No obvious neuro-muscular anomalies detected.  Sensory (Neurological): Unimpaired        Sensory (Neurological): Unimpaired        DTR: Patellar: deferred today Achilles: deferred today Plantar: deferred today  DTR: Patellar: deferred today Achilles: deferred today Plantar: deferred today  Palpation: No palpable anomalies  Palpation: No palpable anomalies   Assessment   Status Diagnosis  Improved Improved Improved 1. Chronic low back pain (Primary area of Pain) (Bilateral)  (L>R) w/o sciatica   2. Chronic coccyx pain (Secondary Area of Pain) (Midline)   3. Chronic lower extremity pain (Tertiary Area of Pain) (Bilateral) (R>L)   4. Bilateral lower extremity edema   5. Mixed sensory-motor polyneuropathy   6. Peripheral neuropathy due to chemotherapy (  [place])   7. Bertolotti's syndrome Chronic  8.  Coccygodynia   9. Abnormal MRI, lumbar spine (06/12/2018)   10. DDD (degenerative disc disease), lumbosacral   11. Lumbar facet syndrome (Bilateral) (L>R)      Updated Problems: No problems updated. Plan of Care  Pharmacotherapy (Medications Ordered): No orders of the defined types were placed in this encounter.  Medications administered today: Joseph Graham "Joseph Graham" had no medications administered during this visit.  Orders:  Orders Placed This Encounter  Procedures  . LUMBAR FACET(MEDIAL BRANCH NERVE BLOCK) MBNB    Standing Status:   Future    Standing Expiration Date:   09/20/2018    Scheduling Instructions:     Side: Bilateral     Level: L3-4, L4-5, & L5-S1 Facets (L2, L3, L4, L5, & S1 Medial Branch Nerves)     Sedation: Patient's choice.     Timeframe: ASAA    Order Specific Question:   Where will this procedure be performed?    Answer:   [facility]   Lab Orders  No laboratory test(s) ordered today   Imaging Orders  No imaging studies ordered today   Referral Orders  No referral(s) requested today   Planned follow-up:   Return for Procedure (w/ sedation): (B) L-FCT #1.    Interventional management options: Considering:   Diagnostic left L5 transverse process and sacrum pseudoarticulation injection  Diagnostic caudal ESI #2  Possible Racz procedure  Diagnostic coccygeal injection  Diagnostic right-sided L4-5 LESI  Diagnostic right-sided L3-4 LESI  Diagnostic right-sided L2 transforaminal ESI  Diagnostic right-sided L3 transforaminal ESI  Diagnostic right-sided L4 transforaminal ESI  Diagnostic bilateral  Lumbar sympathetic nerve block  Diagnostic bilateral lumbar facet block  Possible bilateral lumbar facet RFA  Diagnostic bilateral sacroiliac joint block   Possible bilateral sacroiliac joint RFA    Palliative PRN treatment(s):   None at this time   Future Appointments  Date Time Provider [place]  08/28/2018  9:15 AM Milinda Pointer, MD [facility] None  09/16/2018 11:00 AM [facility]  09/16/2018 11:30 AM [facility]  09/19/2018 10:15 AM [facility] None  09/19/2018 10:45 AM Cammie Sickle, MD [facility] None  09/23/2018 10:30 AM Billey Co, MD [facility] None  12/17/2018 10:00 AM Noreene Filbert, MD [facility] None  01/06/2019  9:15 AM [name], MD [facility]  07/10/2019  8:30 AM Eustace Pen, LPN [facility]  09/12/100  9:30 AM [name], MD [facility]   Primary Care Physician: [name], MD Location: [facility] Note by: [name], MD Date: 08/20/2018; Time: 2:21 PM

## 2018-08-18 ENCOUNTER — Other Ambulatory Visit: Payer: Self-pay | Admitting: *Deleted

## 2018-08-18 DIAGNOSIS — C3491 Malignant neoplasm of unspecified part of right bronchus or lung: Secondary | ICD-10-CM

## 2018-08-18 DIAGNOSIS — T451X5A Adverse effect of antineoplastic and immunosuppressive drugs, initial encounter: Secondary | ICD-10-CM

## 2018-08-18 DIAGNOSIS — C3411 Malignant neoplasm of upper lobe, right bronchus or lung: Secondary | ICD-10-CM

## 2018-08-18 DIAGNOSIS — G620 Drug-induced polyneuropathy: Secondary | ICD-10-CM

## 2018-08-18 MED ORDER — HYDROCODONE-ACETAMINOPHEN 5-325 MG PO TABS: 1.0000 | ORAL_TABLET | Freq: Three times a day (TID) | ORAL | 0 refills | Status: DC | PRN

## 2018-08-18 NOTE — Telephone Encounter (Signed)
Patient called cancer center requesting refill of Norco 2-325 mg tab q 8 hours PRN for moderate pain.   As mandated by the [place] STOP Act (Strengthen Opioid Misuse Prevention), the [place] Controlled Substance Reporting System ([place]) was reviewed for this patient.  Below is the past 42-months of controlled substance prescriptions as displayed by the registry.  I have personally consulted with my supervising physician,Dr. Rogue Bussing, who agrees that continuation of opiate therapy is medically appropriate at this time and agrees to provide continual monitoring, including urine/blood drug screens, as indicated. Refill is appropriate on or after 07/30/18.  N[facility]SRS reviewed:     Faythe Casa, NP 08/18/2018 12:31 PM [phone]

## 2018-08-20 ENCOUNTER — Ambulatory Visit: Payer: Medicare Other | Attending: Pain Medicine | Admitting: Pain Medicine

## 2018-08-20 ENCOUNTER — Other Ambulatory Visit: Payer: Self-pay

## 2018-08-20 ENCOUNTER — Encounter: Payer: Self-pay | Admitting: Pain Medicine

## 2018-08-20 VITALS — BP 136/61 | HR 65 | Temp 98.1°F | Ht 68.0 in | Wt 193.0 lb

## 2018-08-20 DIAGNOSIS — R600 Localized edema: Secondary | ICD-10-CM | POA: Diagnosis not present

## 2018-08-20 DIAGNOSIS — T451X5A Adverse effect of antineoplastic and immunosuppressive drugs, initial encounter: Secondary | ICD-10-CM

## 2018-08-20 DIAGNOSIS — M79604 Pain in right leg: Secondary | ICD-10-CM

## 2018-08-20 DIAGNOSIS — M79605 Pain in left leg: Secondary | ICD-10-CM

## 2018-08-20 DIAGNOSIS — G620 Drug-induced polyneuropathy: Secondary | ICD-10-CM

## 2018-08-20 DIAGNOSIS — M545 Low back pain, unspecified: Secondary | ICD-10-CM

## 2018-08-20 DIAGNOSIS — M47816 Spondylosis without myelopathy or radiculopathy, lumbar region: Secondary | ICD-10-CM

## 2018-08-20 DIAGNOSIS — Q7649 Other congenital malformations of spine, not associated with scoliosis: Secondary | ICD-10-CM

## 2018-08-20 DIAGNOSIS — M533 Sacrococcygeal disorders, not elsewhere classified: Secondary | ICD-10-CM | POA: Diagnosis not present

## 2018-08-20 DIAGNOSIS — G8929 Other chronic pain: Secondary | ICD-10-CM

## 2018-08-20 DIAGNOSIS — M5137 Other intervertebral disc degeneration, lumbosacral region: Secondary | ICD-10-CM

## 2018-08-20 DIAGNOSIS — M51379 Other intervertebral disc degeneration, lumbosacral region without mention of lumbar back pain or lower extremity pain: Secondary | ICD-10-CM

## 2018-08-20 DIAGNOSIS — R937 Abnormal findings on diagnostic imaging of other parts of musculoskeletal system: Secondary | ICD-10-CM

## 2018-08-20 DIAGNOSIS — G608 Other hereditary and idiopathic neuropathies: Secondary | ICD-10-CM

## 2018-08-20 NOTE — Patient Instructions (Addendum)
____________________________________________________________________________________________  Preparing for Procedure with Sedation  Procedure appointments are limited to planned procedures: . No Prescription Refills. . No disability issues will be discussed. . No medication changes will be discussed.  Instructions: . Oral Intake: Do not eat or drink anything for at least 8 hours prior to your procedure. . Transportation: Public transportation is not allowed. Bring an adult driver. The driver must be physically present in our waiting room before any procedure can be started. . Physical Assistance: Bring an adult physically capable of assisting you, in the event you need help. This adult should keep you company at home for at least 6 hours after the procedure. . Blood Pressure Medicine: Take your blood pressure medicine with a sip of water the morning of the procedure. . Blood thinners: Notify our staff if you are taking any blood thinners. Depending on which one you take, there will be specific instructions on how and when to stop it. . Diabetics on insulin: Notify the staff so that you can be scheduled 1st case in the morning. If your diabetes requires high dose insulin, take only  of your normal insulin dose the morning of the procedure and notify the staff that you have done so. . Preventing infections: Shower with an antibacterial soap the morning of your procedure. . Build-up your immune system: Take 1000 mg of Vitamin C with every meal (3 times a day) the day prior to your procedure. . Antibiotics: Inform the staff if you have a condition or reason that requires you to take antibiotics before dental procedures. . Pregnancy: If you are pregnant, call and cancel the procedure. . Sickness: If you have a cold, fever, or any active infections, call and cancel the procedure. . Arrival: You must be in the facility at least 30 minutes prior to your scheduled procedure. . Children: Do not bring  children with you. . Dress appropriately: Bring dark clothing that you would not mind if they get stained. . Valuables: Do not bring any jewelry or valuables.  Reasons to call and reschedule or cancel your procedure: (Following these recommendations will minimize the risk of a serious complication.) . Surgeries: Avoid having procedures within 2 weeks of any surgery. (Avoid for 2 weeks before or after any surgery). . Flu Shots: Avoid having procedures within 2 weeks of a flu shots or . (Avoid for 2 weeks before or after immunizations). . Barium: Avoid having a procedure within 7-10 days after having had a radiological study involving the use of radiological contrast. (Myelograms, Barium swallow or enema study). . Heart attacks: Avoid any elective procedures or surgeries for the initial 6 months after a "Myocardial Infarction" (Heart Attack). . Blood thinners: It is imperative that you stop these medications before procedures. Let us know if you if you take any blood thinner.  . Infection: Avoid procedures during or within two weeks of an infection (including chest colds or gastrointestinal problems). Symptoms associated with infections include: Localized redness, fever, chills, night sweats or profuse sweating, burning sensation when voiding, cough, congestion, stuffiness, runny nose, sore throat, diarrhea, nausea, vomiting, cold or Flu symptoms, recent or current infections. It is specially important if the infection is over the area that we intend to treat. . Heart and lung problems: Symptoms that may suggest an active cardiopulmonary problem include: cough, chest pain, breathing difficulties or shortness of breath, dizziness, ankle swelling, uncontrolled high or unusually low blood pressure, and/or palpitations. If you are experiencing any of these symptoms, cancel your procedure and contact   your primary care physician for an evaluation.  Remember:  Regular Business hours are:  Monday to Thursday  8:00 AM to 4:00 PM  Provider's Schedule: Sadeel Fiddler, MD:  Procedure days: Tuesday and Thursday 7:30 AM to 4:00 PM  Bilal Lateef, MD:  Procedure days: Monday and Wednesday 7:30 AM to 4:00 PM ____________________________________________________________________________________________   ____________________________________________________________________________________________  Muscle Spasms & Cramps  Cause:  The most common cause of muscle spasms and cramps is vitamin and/or electrolyte (calcium, potassium, sodium, etc.) deficiencies.  Possible triggers: Sweating - causes loss of electrolytes thru the skin. Steroids - causes loss of electrolytes thru the urine.  Treatment: 1. Gatorade (or any other electrolyte-replenishing drink) - Take 1, 8 oz glass with each meal (3 times a day). 2. OTC (over-the-counter) Magnesium 400 to 500 mg - Take 1 tablet twice a day (one with breakfast and one before bedtime). If you have kidney problems, talk to your primary care physician before taking any Magnesium. 3. Tonic Water with quinine - Take 1, 8 oz glass before bedtime.   ____________________________________________________________________________________________    

## 2018-08-28 ENCOUNTER — Ambulatory Visit
Admission: RE | Admit: 2018-08-28 | Discharge: 2018-08-28 | Disposition: A | Discharge status: Home / Self Care | Payer: Medicare Other | Source: Ambulatory Visit | Attending: Pain Medicine | Admitting: Pain Medicine

## 2018-08-28 ENCOUNTER — Encounter: Payer: Self-pay | Admitting: Pain Medicine

## 2018-08-28 ENCOUNTER — Other Ambulatory Visit: Payer: Self-pay

## 2018-08-28 ENCOUNTER — Ambulatory Visit (HOSPITAL_BASED_OUTPATIENT_CLINIC_OR_DEPARTMENT_OTHER): Payer: Medicare Other | Admitting: Pain Medicine

## 2018-08-28 VITALS — BP 109/74 | HR 63 | Temp 97.3°F | Resp 17 | Ht 68.0 in | Wt 188.0 lb

## 2018-08-28 DIAGNOSIS — G8929 Other chronic pain: Secondary | ICD-10-CM

## 2018-08-28 DIAGNOSIS — M545 Low back pain: Secondary | ICD-10-CM

## 2018-08-28 DIAGNOSIS — M47817 Spondylosis without myelopathy or radiculopathy, lumbosacral region: Secondary | ICD-10-CM | POA: Diagnosis not present

## 2018-08-28 DIAGNOSIS — M47816 Spondylosis without myelopathy or radiculopathy, lumbar region: Secondary | ICD-10-CM | POA: Insufficient documentation

## 2018-08-28 DIAGNOSIS — M5137 Other intervertebral disc degeneration, lumbosacral region: Secondary | ICD-10-CM | POA: Diagnosis not present

## 2018-08-28 MED ORDER — TRIAMCINOLONE ACETONIDE 40 MG/ML IJ SUSP
80.0000 mg | Freq: Once | INTRAMUSCULAR | Status: AC
  Administered 2018-08-28: 80 mg
  Filled 2018-08-28: qty 2

## 2018-08-28 MED ORDER — ROPIVACAINE HCL 2 MG/ML IJ SOLN
18.0000 mL | Freq: Once | INTRAMUSCULAR | Status: AC
  Administered 2018-08-28: 18 mL via PERINEURAL
  Filled 2018-08-28: qty 20

## 2018-08-28 MED ORDER — LIDOCAINE HCL 2 % IJ SOLN
20.0000 mL | Freq: Once | INTRAMUSCULAR | Status: AC
  Administered 2018-08-28: 400 mg
  Filled 2018-08-28: qty 40

## 2018-08-28 MED ORDER — FENTANYL CITRATE (PF) 100 MCG/2ML IJ SOLN
25.0000 ug | INTRAMUSCULAR | Status: DC | PRN
  Administered 2018-08-28: 25 ug via INTRAVENOUS
  Filled 2018-08-28: qty 2

## 2018-08-28 MED ORDER — MIDAZOLAM HCL 5 MG/5ML IJ SOLN
1.0000 mg | INTRAMUSCULAR | Status: DC | PRN
  Administered 2018-08-28: 2 mg via INTRAVENOUS
  Filled 2018-08-28: qty 5

## 2018-08-28 MED ORDER — LACTATED RINGERS IV SOLN
1000.0000 mL | Freq: Once | INTRAVENOUS | Status: AC
  Administered 2018-08-28: 1000 mL via INTRAVENOUS

## 2018-08-28 NOTE — Progress Notes (Signed)
Safety precautions to be maintained throughout the outpatient stay will include: orient to surroundings, keep bed in low position, maintain call bell within reach at all times, provide assistance with transfer out of bed and ambulation.  

## 2018-08-28 NOTE — Progress Notes (Signed)
Patient's Name: Joseph Graham  MRN: [id number]  Referring Provider: [name], MD  DOB: 16-Oct-1940  PCP: [name], MD  DOS: 08/28/2018  Note by: [name], MD  Service setting: Ambulatory outpatient  Specialty: Interventional Pain Management  Patient type: Established  Location: [facility] (AMB) Pain Management Facility  Visit type: Interventional Procedure   Primary Reason for Visit: Interventional Pain Management Treatment. CC: Back Pain and Neck Pain  Procedure:          Anesthesia, Analgesia, Anxiolysis:  Type: Lumbar Facet, Medial Branch Block(s) #1  Primary Purpose: Diagnostic Region: Posterolateral Lumbosacral Spine Level: L2, L3, L4, L5, & S1 Medial Branch Level(s). Injecting these levels blocks the L3-4, L4-5, and L5-S1 lumbar facet joints. Laterality: Bilateral  Type: Moderate (Conscious) Sedation combined with Local Anesthesia Indication(s): Analgesia and Anxiety Route: Intravenous (IV) IV Access: Secured Sedation: Meaningful verbal contact was maintained at all times during the procedure  Local Anesthetic: Lidocaine 1-2%  Position: Prone   Indications: 1. Lumbar facet syndrome (Bilateral) (L>R)   2. Spondylosis without myelopathy or radiculopathy, lumbosacral region   3. Lumbar facet hypertrophy   4. DDD (degenerative disc disease), lumbosacral   5. Chronic low back pain (Primary area of Pain) (Bilateral)  (L>R) w/o sciatica   6. Lumbar spondylosis   7. Osteoarthritis of facet joint of lumbar spine    Pain Score: Pre-procedure: 1 /10 Post-procedure: 0-No pain/10  Pre-op Assessment:  Joseph Graham is a 78 y.o. (year old), male patient, seen today for interventional treatment. He  has a past surgical history that includes Hemorroidectomy (1982); A flutter ablation (03/2010); Cataract extraction; carotid US (03/2010); Colonoscopy (09/2011); Tonsillectomy (1964); Electormagnetic navigation bronchoscopy (N/A, 03/02/2015); Transurethral resection of bladder  tumor (N/A, 04/06/2015); Cystoscopy w/ retrogrades (Bilateral, 04/06/2015); urinary stent removal (04/14/15); Transurethral resection of bladder tumor (N/A, 07/13/2015); Portacath placement (N/A, 08/10/2015); Port-a-cath removal (Right, 04/03/2016); and Polypectomy. Joseph Graham has a current medication list which includes the following prescription(s): acetaminophen, cholecalciferol, cyanocobalamin, gabapentin, hydrocodone-acetaminophen, lisinopril, metoprolol tartrate, multi-vitamins, NON FORMULARY, NONFORMULARY OR COMPOUNDED ITEM, omeprazole, polyethylene glycol, primidone, and vitamin e, and the following Facility-Administered Medications: fentanyl, lactated ringers, and midazolam. His primarily concern today is the Back Pain and Neck Pain  Initial Vital Signs:  Pulse/HCG Rate: 63ECG Heart Rate: 62 Temp: 98 F (36.7 C) Resp: 18 BP: (!) 148/52 SpO2: 97 %  BMI: Estimated body mass index is 28.59 kg/m as calculated from the following:   Height as of this encounter: 5\' 8"  (1.727 m).   Weight as of this encounter: 188 lb (85.3 kg).  Risk Assessment: Allergies: Reviewed. He has No Known Allergies.  Allergy Precautions: None required Coagulopathies: Reviewed. None identified.  Blood-thinner therapy: None at this time Active Infection(s): Reviewed. None identified. Joseph Graham is afebrile  Site Confirmation: Joseph Graham was asked to confirm the procedure and laterality before marking the site Procedure checklist: Completed Consent: Before the procedure and under the influence of no sedative(s), amnesic(s), or anxiolytics, the patient was informed of the treatment options, risks and possible complications. To fulfill our ethical and legal obligations, as recommended by the American Medical Association's Code of Ethics, I have informed the patient of my clinical impression; the nature and purpose of the treatment or procedure; the risks, benefits, and possible complications of the intervention; the  alternatives, including doing nothing; the risk(s) and benefit(s) of the alternative treatment(s) or procedure(s); and the risk(s) and benefit(s) of doing nothing. The patient was provided information about the general risks and possible complications  associated with the procedure. These may include, but are not limited to: failure to achieve desired goals, infection, bleeding, organ or nerve damage, allergic reactions, paralysis, and death. In addition, the patient was informed of those risks and complications associated to Spine-related procedures, such as failure to decrease pain; infection (i.e.: Meningitis, epidural or intraspinal abscess); bleeding (i.e.: epidural hematoma, subarachnoid hemorrhage, or any other type of intraspinal or peri-dural bleeding); organ or nerve damage (i.e.: Any type of peripheral nerve, nerve root, or spinal cord injury) with subsequent damage to sensory, motor, and/or autonomic systems, resulting in permanent pain, numbness, and/or weakness of one or several areas of the body; allergic reactions; (i.e.: anaphylactic reaction); and/or death. Furthermore, the patient was informed of those risks and complications associated with the medications. These include, but are not limited to: allergic reactions (i.e.: anaphylactic or anaphylactoid reaction(s)); adrenal axis suppression; blood sugar elevation that in diabetics may result in ketoacidosis or comma; water retention that in patients with history of congestive heart failure may result in shortness of breath, pulmonary edema, and decompensation with resultant heart failure; weight gain; swelling or edema; medication-induced neural toxicity; particulate matter embolism and blood vessel occlusion with resultant organ, and/or nervous system infarction; and/or aseptic necrosis of one or more joints. Finally, the patient was informed that Medicine is not an exact science; therefore, there is also the possibility of unforeseen or  unpredictable risks and/or possible complications that may result in a catastrophic outcome. The patient indicated having understood very clearly. We have given the patient no guarantees and we have made no promises. Enough time was given to the patient to ask questions, all of which were answered to the patient's satisfaction. Joseph Graham has indicated that he wanted to continue with the procedure. Attestation: I, the ordering provider, attest that I have discussed with the patient the benefits, risks, side-effects, alternatives, likelihood of achieving goals, and potential problems during recovery for the procedure that I have provided informed consent. Date   Time: 08/28/2018  9:20 AM  Pre-Procedure Preparation:  Monitoring: As per clinic protocol. Respiration, ETCO2, SpO2, BP, heart rate and rhythm monitor placed and checked for adequate function Safety Precautions: Patient was assessed for positional comfort and pressure points before starting the procedure. Time-out: I initiated and conducted the "Time-out" before starting the procedure, as per protocol. The patient was asked to participate by confirming the accuracy of the "Time Out" information. Verification of the correct person, site, and procedure were performed and confirmed by me, the nursing staff, and the patient. "Time-out" conducted as per Joint Commission's Universal Protocol (UP.01.01.01). Time: 1013  Description of Procedure:          Laterality: Bilateral. The procedure was performed in identical fashion on both sides. Levels:  L2, L3, L4, L5, & S1 Medial Branch Level(s) Area Prepped: Posterior Lumbosacral Region Prepping solution: ChloraPrep (2% chlorhexidine gluconate and 70% isopropyl alcohol) Safety Precautions: Aspiration looking for blood return was conducted prior to all injections. At no point did we inject any substances, as a needle was being advanced. Before injecting, the patient was told to immediately notify me if he  was experiencing any new onset of "ringing in the ears, or metallic taste in the mouth". No attempts were made at seeking any paresthesias. Safe injection practices and needle disposal techniques used. Medications properly checked for expiration dates. SDV (single dose vial) medications used. After the completion of the procedure, all disposable equipment used was discarded in the proper designated medical waste containers. Local Anesthesia:  Protocol guidelines were followed. The patient was positioned over the fluoroscopy table. The area was prepped in the usual manner. The time-out was completed. The target area was identified using fluoroscopy. A 12-in long, straight, sterile hemostat was used with fluoroscopic guidance to locate the targets for each level blocked. Once located, the skin was marked with an approved surgical skin marker. Once all sites were marked, the skin (epidermis, dermis, and hypodermis), as well as deeper tissues (fat, connective tissue and muscle) were infiltrated with a small amount of a short-acting local anesthetic, loaded on a 10cc syringe with a 25G, 1.5-in  Needle. An appropriate amount of time was allowed for local anesthetics to take effect before proceeding to the next step. Local Anesthetic: Lidocaine 2.0% The unused portion of the local anesthetic was discarded in the proper designated containers. Technical explanation of process:  L2 Medial Branch Nerve Block (MBB): The target area for the L2 medial branch is at the junction of the postero-lateral aspect of the superior articular process and the superior, posterior, and medial edge of the transverse process of L3. Under fluoroscopic guidance, a Quincke needle was inserted until contact was made with os over the superior postero-lateral aspect of the pedicular shadow (target area). After negative aspiration for blood, 0.5 mL of the nerve block solution was injected without difficulty or complication. The needle was removed  intact. L3 Medial Branch Nerve Block (MBB): The target area for the L3 medial branch is at the junction of the postero-lateral aspect of the superior articular process and the superior, posterior, and medial edge of the transverse process of L4. Under fluoroscopic guidance, a Quincke needle was inserted until contact was made with os over the superior postero-lateral aspect of the pedicular shadow (target area). After negative aspiration for blood, 0.5 mL of the nerve block solution was injected without difficulty or complication. The needle was removed intact. L4 Medial Branch Nerve Block (MBB): The target area for the L4 medial branch is at the junction of the postero-lateral aspect of the superior articular process and the superior, posterior, and medial edge of the transverse process of L5. Under fluoroscopic guidance, a Quincke needle was inserted until contact was made with os over the superior postero-lateral aspect of the pedicular shadow (target area). After negative aspiration for blood, 0.5 mL of the nerve block solution was injected without difficulty or complication. The needle was removed intact. L5 Medial Branch Nerve Block (MBB): The target area for the L5 medial branch is at the junction of the postero-lateral aspect of the superior articular process and the superior, posterior, and medial edge of the sacral ala. Under fluoroscopic guidance, a Quincke needle was inserted until contact was made with os over the superior postero-lateral aspect of the pedicular shadow (target area). After negative aspiration for blood, 0.5 mL of the nerve block solution was injected without difficulty or complication. The needle was removed intact. S1 Medial Branch Nerve Block (MBB): The target area for the S1 medial branch is at the posterior and inferior 6 o'clock position of the L5-S1 facet joint. Under fluoroscopic guidance, the Quincke needle inserted for the L5 MBB was redirected until contact was made with  os over the inferior and postero aspect of the sacrum, at the 6 o' clock position under the L5-S1 facet joint (Target area). After negative aspiration for blood, 0.5 mL of the nerve block solution was injected without difficulty or complication. The needle was removed intact.  Nerve block solution: 0.2% PF-Ropivacaine + Triamcinolone (40  mg/mL) diluted to a final concentration of 4 mg of Triamcinolone/mL of Ropivacaine The unused portion of the solution was discarded in the proper designated containers. Procedural Needles: 22-gauge, 3.5-inch, Quincke needles used for all levels.  Once the entire procedure was completed, the treated area was cleaned, making sure to leave some of the prepping solution back to take advantage of its long term bactericidal properties.   Illustration of the posterior view of the lumbar spine and the posterior neural structures. Laminae of L2 through S1 are labeled. DPRL5, dorsal primary ramus of L5; DPRS1, dorsal primary ramus of S1; DPR3, dorsal primary ramus of L3; FJ, facet (zygapophyseal) joint L3-L4; I, inferior articular process of L4; LB1, lateral branch of dorsal primary ramus of L1; IAB, inferior articular branches from L3 medial branch (supplies L4-L5 facet joint); IBP, intermediate branch plexus; MB3, medial branch of dorsal primary ramus of L3; NR3, third lumbar nerve root; S, superior articular process of L5; SAB, superior articular branches from L4 (supplies L4-5 facet joint also); TP3, transverse process of L3.  Vitals:   08/28/18 1026 08/28/18 1029 08/28/18 1039 08/28/18 1049  BP: 138/72 135/72 (!) 118/54 128/63  Pulse:      Resp: 16 18 16 18   Temp:      SpO2: 97% 98% 99% 99%  Weight:      Height:         Start Time: 1013 hrs. End Time: 1028 hrs.  Imaging Guidance (Spinal):          Type of Imaging Technique: Fluoroscopy Guidance (Spinal) Indication(s): Assistance in needle guidance and placement for procedures requiring needle placement in or  near specific anatomical locations not easily accessible without such assistance. Exposure Time: Please see nurses notes. Contrast: None used. Fluoroscopic Guidance: I was personally present during the use of fluoroscopy. "Tunnel Vision Technique" used to obtain the best possible view of the target area. Parallax error corrected before commencing the procedure. "Direction-depth-direction" technique used to introduce the needle under continuous pulsed fluoroscopy. Once target was reached, antero-posterior, oblique, and lateral fluoroscopic projection used confirm needle placement in all planes. Images permanently stored in EMR. Interpretation: No contrast injected. I personally interpreted the imaging intraoperatively. Adequate needle placement confirmed in multiple planes. Permanent images saved into the patient's record.  Antibiotic Prophylaxis:   Anti-infectives (From admission, onward)   None     Indication(s): None identified  Post-operative Assessment:  Post-procedure Vital Signs:  Pulse/HCG Rate: 6361 Temp: 98 F (36.7 C) Resp: 18 BP: 128/63 SpO2: 99 %  EBL: None  Complications: No immediate post-treatment complications observed by team, or reported by patient.  Note: The patient tolerated the entire procedure well. A repeat set of vitals were taken after the procedure and the patient was kept under observation following institutional policy, for this type of procedure. Post-procedural neurological assessment was performed, showing return to baseline, prior to discharge. The patient was provided with post-procedure discharge instructions, including a section on how to identify potential problems. Should any problems arise concerning this procedure, the patient was given instructions to immediately contact us, at any time, without hesitation. In any case, we plan to contact the patient by telephone for a follow-up status report regarding this interventional procedure.  Comments:  No  additional relevant information.  Plan of Care  Orders:  Orders Placed This Encounter  Procedures   LUMBAR FACET(MEDIAL BRANCH NERVE BLOCK) MBNB    Scheduling Instructions:     Side: Bilateral     Level: L3-4, L4-5, &  L5-S1 Facets (L2, L3, L4, L5, & S1 Medial Branch Nerves)     Sedation: With Sedation.     Timeframe: Today    Order Specific Question:   Where will this procedure be performed?    Answer:   [facility]   DG C-Arm 1-60 Min-No Report    Intraoperative interpretation by procedural physician at [place].    Standing Status:   Standing    Number of Occurrences:   1    Order Specific Question:   Reason for exam:    Answer:   Assistance in needle guidance and placement for procedures requiring needle placement in or near specific anatomical locations not easily accessible without such assistance.   Provider attestation of informed consent for procedure/surgical case    I, the ordering provider, attest that I have discussed with the patient the benefits, risks, side effects, alternatives, likelihood of achieving goals and potential problems during recovery for the procedure that I have provided informed consent.    Standing Status:   Standing    Number of Occurrences:   1   Informed Consent Details: Transcribe to consent form and obtain patient signature    Standing Status:   Standing    Number of Occurrences:   1    Order Specific Question:   Procedure    Answer:   IV sedation    Order Specific Question:   Surgeon    Answer:   Ioanna Colquhoun A. Dossie Arbour, MD    Order Specific Question:   Indication/Reason    Answer:   Analgesia and/or Anxiety   Informed Consent Details: Transcribe to consent form and obtain patient signature    Standing Status:   Standing    Number of Occurrences:   1    Order Specific Question:   Procedure    Answer:   Bilateral Lumbar facet block (medial branch block) under fluoroscopic guidance. (See notes for levels.)    Order  Specific Question:   Surgeon    Answer:   Dayami Taitt A. Dossie Arbour, MD    Order Specific Question:   Indication/Reason    Answer:   Bilateral low back pain with or without lower extremity pain   Follow-up    Post-procedure Phone Call: Call patient tomorrow for routine early follow-up evaluation.  Return Appointment Timeframe: Approximately 2 weeks, depending on appointment availability.    Standing Status:   Standing    Number of Occurrences:   1    Order Specific Question:   Specify    Answer:   Schedule a return appointment for post-procedure evaluation. In addition arrange for patient to receive a follow-up phone call tomorrow to assess post-procedure status.   Medications ordered for procedure: Meds ordered this encounter  Medications   lidocaine (XYLOCAINE) 2 % (with pres) injection 400 mg   lactated ringers infusion 1,000 mL   midazolam (VERSED) 5 MG/5ML injection 1-2 mg    Make sure Flumazenil is available in the pyxis when using this medication. If oversedation occurs, administer 0.2 mg IV over 15 sec. If after 45 sec no response, administer 0.2 mg again over 1 min; may repeat at 1 min intervals; not to exceed 4 doses (1 mg)   fentaNYL (SUBLIMAZE) injection 25-50 mcg    Make sure Narcan is available in the pyxis when using this medication. In the event of respiratory depression (RR< 8/min): Titrate NARCAN (naloxone) in increments of 0.1 to 0.2 mg IV at 2-3 minute intervals, until desired degree of reversal.  ropivacaine (PF) 2 mg/mL (0.2%) (NAROPIN) injection 18 mL   triamcinolone acetonide (KENALOG-40) injection 80 mg   Medications administered: We administered lidocaine, lactated ringers, midazolam, fentaNYL, ropivacaine (PF) 2 mg/mL (0.2%), and triamcinolone acetonide.  See the medical record for exact dosing, route, and time of administration.  Disposition: Discharge home  Discharge Date & Time: 08/28/2018; 1100 hrs.   Follow-up plan:   Return for EPP (2 wks) w/ Dr.  Dossie Arbour.     Future Appointments  Date Time Provider [place]  09/16/2018 11:00 AM [facility]  09/16/2018 11:30 AM [facility]  09/19/2018 10:15 AM [facility] None  09/19/2018 10:45 AM Cammie Sickle, MD [facility] None  09/22/2018  1:15 PM Milinda Pointer, MD [facility] None  09/23/2018 10:30 AM Billey Co, MD [facility] None  12/17/2018 10:00 AM Noreene Filbert, MD [facility] None  01/06/2019  9:15 AM [name], MD [facility]  07/10/2019  8:30 AM Eustace Pen, LPN [facility]  [id number]/02/[id number]  9:30 AM [name], MD [facility]   Primary Care Physician: [name], MD Location: [facility] Note by: [name], MD Date: 08/28/2018; Time: 10:51 AM  Disclaimer:  Medicine is not an exact science. The only guarantee in medicine is that nothing is guaranteed. It is important to note that the decision to proceed with this intervention was based on the information collected from the patient. The Data and conclusions were drawn from the patient's questionnaire, the interview, and the physical examination. Because the information was provided in large part by the patient, it cannot be guaranteed that it has not been purposely or unconsciously manipulated. Every effort has been made to obtain as much relevant data as possible for this evaluation. It is important to note that the conclusions that lead to this procedure are derived in large part from the available data. Always take into account that the treatment will also be dependent on availability of resources and existing treatment guidelines, considered by other Pain Management Practitioners as being common knowledge and practice, at the time of the intervention. For Medico-Legal purposes, it is also important to point out that variation in procedural techniques and pharmacological choices are the acceptable norm. The indications,  contraindications, technique, and results of the above procedure should only be interpreted and judged by a Board-Certified Interventional Pain Specialist with extensive familiarity and expertise in the same exact procedure and technique.

## 2018-08-28 NOTE — Patient Instructions (Addendum)
____________________________________________________________________________________________  Post-Procedure Discharge Instructions  Instructions:  Apply ice:   Purpose: This will minimize any swelling and discomfort after procedure.   When: Day of procedure, as soon as you get home.  How: Fill a plastic sandwich bag with crushed ice. Cover it with a small towel and apply to injection site.  How long: (15 min on, 15 min off) Apply for 15 minutes then remove x 15 minutes.  Repeat sequence on day of procedure, until you go to bed.  Apply heat:   Purpose: To treat any soreness and discomfort from the procedure.  When: Starting the next day after the procedure.  How: Apply heat to procedure site starting the day following the procedure.  How long: May continue to repeat daily, until discomfort goes away.  Food intake: Start with clear liquids (like water) and advance to regular food, as tolerated.   Physical activities: Keep activities to a minimum for the first 8 hours after the procedure. After that, then as tolerated.  Driving: If you have received any sedation, be responsible and do not drive. You are not allowed to drive for 24 hours after having sedation.  Blood thinner: (Applies only to those taking blood thinners) You may restart your blood thinner 6 hours after your procedure.  Insulin: (Applies only to Diabetic patients taking insulin) As soon as you can eat, you may resume your normal dosing schedule.  Infection prevention: Keep procedure site clean and dry. Shower daily and clean area with soap and water.  Post-procedure Pain Diary: Extremely important that this be done correctly and accurately. Recorded information will be used to determine the next step in treatment. For the purpose of accuracy, follow these rules:  Evaluate only the area treated. Do not report or include pain from an untreated area. For the purpose of this evaluation, ignore all other areas of pain,  except for the treated area.  After your procedure, avoid taking a long nap and attempting to complete the pain diary after you wake up. Instead, set your alarm clock to go off every hour, on the hour, for the initial 8 hours after the procedure. Document the duration of the numbing medicine, and the relief you are getting from it.  Do not go to sleep and attempt to complete it later. It will not be accurate. If you received sedation, it is likely that you were given a medication that may cause amnesia. Because of this, completing the diary at a later time may cause the information to be inaccurate. This information is needed to plan your care.  Follow-up appointment: Keep your post-procedure follow-up evaluation appointment after the procedure (usually 2 weeks for most procedures, 6 weeks for radiofrequencies). DO NOT FORGET to bring you pain diary with you.   Expect: (What should I expect to see with my procedure?)  From numbing medicine (AKA: Local Anesthetics): Numbness or decrease in pain. You may also experience some weakness, which if present, could last for the duration of the local anesthetic.  Onset: Full effect within 15 minutes of injected.  Duration: It will depend on the type of local anesthetic used. On the average, 1 to 8 hours.   From steroids (Applies only if steroids were used): Decrease in swelling or inflammation. Once inflammation is improved, relief of the pain will follow.  Onset of benefits: Depends on the amount of swelling present. The more swelling, the longer it will take for the benefits to be seen. In some cases, up to 10 days.    Duration: Steroids will stay in the system x 2 weeks. Duration of benefits will depend on multiple posibilities including persistent irritating factors.  Side-effects: If present, they may typically last 2 weeks (the duration of the steroids).  Frequent: Cramps (if they occur, drink Gatorade and take over-the-counter Magnesium 450-500 mg  once to twice a day); water retention with temporary weight gain; increases in blood sugar; decreased immune system response; increased appetite.  Occasional: Facial flushing (red, warm cheeks); mood swings; menstrual changes.  Uncommon: Long-term decrease or suppression of natural hormones; bone thinning. (These are more common with higher doses or more frequent use. This is why we prefer that our patients avoid having any injection therapies in other practices.)   Very Rare: Severe mood changes; psychosis; aseptic necrosis.  From procedure: Some discomfort is to be expected once the numbing medicine wears off. This should be minimal if ice and heat are applied as instructed.  Call if: (When should I call?)  You experience numbness and weakness that gets worse with time, as opposed to wearing off.  New onset bowel or bladder incontinence. (Applies only to procedures done in the spine)  Emergency Numbers:  Durning business hours (Monday - Thursday, 8:00 AM - 4:00 PM) (Friday, 9:00 AM - 12:00 Noon): [phone]  After hours: [phone]  NOTE: If you are having a problem and are unable connect with, or to talk to a provider, then go to your nearest urgent care or emergency department. If the problem is serious and urgent, please call 911. ____________________________________________________________________________________________   Pain Management Discharge Instructions  General Discharge Instructions :  If you need to reach your doctor call: Monday-Friday 8:00 am - 4:00 pm at [phone] or toll free [phone].  After clinic hours [phone] to have operator reach doctor.  Bring all of your medication bottles to all your appointments in the pain clinic.  To cancel or reschedule your appointment with Pain Management please remember to call 24 hours in advance to avoid a fee.  Refer to the educational materials which you have been given on: General Risks, I had my  Procedure. Discharge Instructions, Post Sedation.  Post Procedure Instructions:  The drugs you were given will stay in your system until tomorrow, so for the next 24 hours you should not drive, make any legal decisions or drink any alcoholic beverages.  You may eat anything you prefer, but it is better to start with liquids then soups and crackers, and gradually work up to solid foods.  Please notify your doctor immediately if you have any unusual bleeding, trouble breathing or pain that is not related to your normal pain.  Depending on the type of procedure that was done, some parts of your body may feel week and/or numb.  This usually clears up by tonight or the next day.  Walk with the use of an assistive device or accompanied by an adult for the 24 hours.  You may use ice on the affected area for the first 24 hours.  Put ice in a Ziploc bag and cover with a towel and place against area 15 minutes on 15 minutes off.  You may switch to heat after 24 hours.

## 2018-08-29 ENCOUNTER — Telehealth: Payer: Self-pay

## 2018-08-29 NOTE — Telephone Encounter (Signed)
Post procedure phone call.  LM 

## 2018-08-31 NOTE — Progress Notes (Signed)
I reviewed health advisor's note, was available for consultation, and agree with documentation and plan.  

## 2018-09-02 ENCOUNTER — Other Ambulatory Visit: Payer: Medicare Other | Admitting: Urology

## 2018-09-03 ENCOUNTER — Encounter: Payer: Self-pay | Admitting: Internal Medicine

## 2018-09-08 ENCOUNTER — Encounter: Payer: Self-pay | Admitting: Family Medicine

## 2018-09-11 ENCOUNTER — Other Ambulatory Visit: Payer: Self-pay | Admitting: Internal Medicine

## 2018-09-11 DIAGNOSIS — C3411 Malignant neoplasm of upper lobe, right bronchus or lung: Secondary | ICD-10-CM

## 2018-09-16 ENCOUNTER — Ambulatory Visit: Payer: Medicare Other

## 2018-09-19 ENCOUNTER — Inpatient Hospital Stay: Payer: Medicare Other

## 2018-09-19 ENCOUNTER — Inpatient Hospital Stay: Payer: Medicare Other | Admitting: Internal Medicine

## 2018-09-21 NOTE — Progress Notes (Signed)
Pain Management Virtual Encounter Note - Virtual Visit via Telephone Telehealth (real-time audio visits between healthcare provider and patient).  Patient's Phone No. & Preferred Pharmacy:  [phone] (home); [phone] (mobile); (Preferred) [phone]  [facility] [place], [place] [address] [place] [address] Phone: [phone] Fax: [phone]   Pre-screening note:  Our staff contacted Joseph Graham and offered him an "in person", "face-to-face" appointment versus a telephone encounter. He indicated preferring the telephone encounter, at this time.  Reason for Virtual Visit: COVID-19*  Social distancing based on CDC and AMA recommendations.   I contacted Joseph Graham on 09/22/2018 at 3:38 PM by telephone and clearly identified myself as Joseph Cola, MD. I verified that I was speaking with the correct person using two identifiers (Name and date of birth: Dec 02, 1940).  Advanced Informed Consent I sought verbal advanced consent from Joseph Graham for telemedicine interactions and virtual visit. I informed Joseph Graham of the security and privacy concerns, risks, and limitations associated with performing an evaluation and management service by telephone. I also informed Joseph Graham of the availability of "in person" appointments and I informed him of the possibility of a patient responsible charge related to this service. Joseph Graham expressed understanding and agreed to proceed.   Historic Elements   Joseph Graham is a 78 y.o. year old, male patient evaluated today after his last encounter by our practice on 08/29/2018. Joseph Graham  has a past medical history of Alcohol abuse, in remission, Arthritis, Benign essential tremor, BPH (benign prostatic hypertrophy) (12/30/2012), Cardiac arrhythmia (03/2010), Cataract, Chemotherapy-induced neutropenia ([place]) (09/23/2015), Chronic  low back pain, Colon polyps, Complication of anesthesia, COPD (chronic obstructive pulmonary disease) ([place]), Coronary artery disease, Diabetes mellitus without complication ([place]), Dyslipidemia, GERD (gastroesophageal reflux disease), HTN (hypertension), Macular degeneration, bilateral, Multiple pulmonary nodules (02/2015), Neuromuscular disorder ([place]), Peripheral neuropathy due to chemotherapy ([place]) (12/23/2015), Personal history of tobacco use, presenting hazards to health (02/18/2015), Pneumothorax after biopsy (04/01/2015), Primary cancer of bladder ([place]) (2016), Primary lung cancer ([place]) (2016), and Shortness of breath dyspnea. He also  has a past surgical history that includes Hemorroidectomy (1982); A flutter ablation (03/2010); Cataract extraction; carotid US (03/2010); Colonoscopy (09/2011); Tonsillectomy (1964); Electormagnetic navigation bronchoscopy (N/A, 03/02/2015); Transurethral resection of bladder tumor (N/A, 04/06/2015); Cystoscopy w/ retrogrades (Bilateral, 04/06/2015); urinary stent removal (04/14/15); Transurethral resection of bladder tumor (N/A, 07/13/2015); Portacath placement (N/A, 08/10/2015); Port-a-cath removal (Right, 04/03/2016); and Polypectomy. Joseph Graham has a current medication list which includes the following prescription(s): acetaminophen, cholecalciferol, cyanocobalamin, gabapentin, hydrocodone-acetaminophen, lisinopril, metoprolol tartrate, multi-vitamins, NON FORMULARY, NONFORMULARY OR COMPOUNDED ITEM, omeprazole, polyethylene glycol, primidone, and vitamin e. He  reports that he quit smoking about 3 years ago. His smoking use included cigarettes. He has a 91.50 pack-year smoking history. He has never used smokeless tobacco. He reports that he does not drink alcohol or use drugs. Joseph Graham has No Known Allergies.   HPI  I last saw him on 08/28/2018. He is being evaluated for a post-procedure assessment.  Post-Procedure Evaluation  Procedure: Diagnostic bilateral lumbar facet block  #1 under fluoroscopic guidance and IV sedation Pre-procedure pain level:  1/10 Post-procedure: 0/10 (100% relief)  Sedation: Sedation provided.  Effectiveness during initial hour after procedure(Ultra-Short Term Relief): 100 %  Local anesthetic used: Long-acting (4-6 hours) Effectiveness: Defined as any analgesic benefit obtained secondary to the administration of local anesthetics. This carries significant diagnostic value as to the  etiological location, or anatomical origin, of the pain. Duration of benefit is expected to coincide with the duration of the local anesthetic used.  Effectiveness during initial 4-6 hours after procedure(Short-Term Relief): 100 %  Long-term benefit: Defined as any relief past the pharmacologic duration of the local anesthetics.  Effectiveness past the initial 6 hours after procedure(Long-Term Relief): 90 %  Current benefits: Defined as benefit that persist at this time.   Analgesia:  90-100% better.  According to the patient does help primarily the spasms in the leg and the low back pain. Function: Joseph Graham reports improvement in function ROM: Joseph Graham reports improvement in ROM  Review of recent tests  DG C-Arm 1-60 Min-No Report Fluoroscopy was utilized by the requesting physician.  No radiographic  interpretation.    Office Visit on 07/14/2018  Component Date Value Ref Range Status  . Summary 07/14/2018 FINAL   Final   Comment: ==================================================================== TOX[facility] COMP DRUG ANALYSIS,UR ==================================================================== Test                             Result       Flag       Units Drug Present and Declared for Prescription Verification   Hydrocodone                    185          EXPECTED   ng/mg creat   Hydromorphone                  207          EXPECTED   ng/mg creat   Norhydrocodone                 563          EXPECTED   ng/mg creat    Sources of hydrocodone  include scheduled prescription    medications. Hydromorphone and norhydrocodone are expected    metabolites of hydrocodone. Hydromorphone is also available as a    scheduled prescription medication.   Primidone                      PRESENT      EXPECTED   Phenobarbital                  PRESENT      EXPECTED    Phenobarbital is an expected metabolite of primidone;    Phenobarbital may also be administered as a prescription drug.   Gabapentin                                               PRESENT      EXPECTED   Acetaminophen                  PRESENT      EXPECTED Drug Absent but Declared for Prescription Verification   Metoprolol                     Not Detected UNEXPECTED ==================================================================== Test                      Result    Flag   Units      Ref Range   Creatinine  96               mg/dL      >=20 ==================================================================== Declared Medications:  The flagging and interpretation on this report are based on the  following declared medications.  Unexpected results may arise from  inaccuracies in the declared medications.  **Note: The testing scope of this panel includes these medications:  Gabapentin  Hydrocodone (Hydrocodone-Acetaminophen)  Metoprolol (Lopressor)  Primidone (Mysoline)  **Note: The testing scope of this panel does not include small to  moderate amounts of these reported medications:  Acetaminophen  Acetaminophen (Hydrocodone-Ac                          etaminophen)  **Note: The testing scope of this panel does not include following  reported medications:  Cannabidiol  Cholecalciferol  Cyanocobalamin  Lisinopril  Multivitamin  Omeprazole (Prilosec)  Polyethylene Glycol  Vitamin E ==================================================================== For clinical consultation, please call [phone]. ====================================================================   . Glucose 07/14/2018 103* 65 - 99 mg/dL Final  . BUN 07/14/2018 22  8 - 27 mg/dL Final  . Creatinine, Ser 07/14/2018 0.63* 0.76 - 1.27 mg/dL Final  . GFR calc non Af Amer 07/14/2018 94  >59 mL/min/1.73 Final  . GFR calc Af Amer 07/14/2018 109  >59 mL/min/1.73 Final  . BUN/Creatinine Ratio 07/14/2018 35* 10 - 24 Final  . Sodium 07/14/2018 141  134 - 144 mmol/L Final  . Potassium 07/14/2018 4.9  3.5 - 5.2 mmol/L Final  . Chloride 07/14/2018 97  96 - 106 mmol/L Final  . Calcium 07/14/2018 9.2  8.6 - 10.2 mg/dL Final  . Total Protein 07/14/2018 7.9  6.0 - 8.5 g/dL Final  . Albumin 07/14/2018 4.6  3.7 - 4.7 g/dL Final                 **Please note reference interval change**  . Globulin, Total 07/14/2018 3.3  1.5 - 4.5 g/dL Final  . Albumin/Globulin Ratio 07/14/2018 1.4  1.2 - 2.2 Final  . Bilirubin Total 07/14/2018 <0.2  0.0 - 1.2 mg/dL Final  . Alkaline Phosphatase 07/14/2018 98  39 - 117 IU/L Final  . AST 07/14/2018 19  0 - 40 IU/L Final  . Magnesium 07/14/2018 2.0  1.6 - 2.3 mg/dL Final  . Vitamin B-12 07/14/2018 735  232 - 1,245 pg/mL Final  . Sed Rate 07/14/2018 51* 0 - 30 mm/hr Final  . 25-Hydroxy, Vitamin D 07/14/2018 30  ng/mL Final   Comment: Reference Range: All Ages: Target levels 30 - 100   . 25-Hydroxy, Vitamin D-2 07/14/2018 <1.0  ng/mL Final  . 25-Hydroxy, Vitamin D-3 07/14/2018 30  ng/mL Final  . CRP 07/14/2018 3  0 - 10 mg/L Final   Assessment  The primary encounter diagnosis was Chronic pain syndrome. 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  Plan of Care  I am having Joseph Graham "Clair Gulling" maintain his Multi-Vitamins, Cyanocobalamin (B-12 PO), VITAMIN  E PO, primidone, Cholecalciferol (VITAMIN D3 PO), Acetaminophen (TYLENOL EXTRA STRENGTH PO), omeprazole, polyethylene glycol, NON FORMULARY, gabapentin, NONFORMULARY OR COMPOUNDED ITEM, lisinopril, metoprolol tartrate, and HYDROcodone-acetaminophen.  Pharmacotherapy (Medications Ordered): No orders of the defined types were placed in this encounter.  Orders:  Orders Placed This Encounter  Procedures  . Large  Joint Injection/Arthrocentesis    Standing Status:   Future    Standing Expiration Date:   03/23/2020    Scheduling Instructions:     Level(s): Diagnostic left pseudoarticulation injection #1 between the left L5 transverse process and the left sacral ala for the diagnosis of Bertolotti's syndrome (Left), under fluoroscopic guidance and IV sedation     Laterality: Left     Sedation: With Sedation     Purpose: Diagnostic     Indication(s): Sub-acute pain     Requested Scheduling Timeframe: ASAA   Follow-up plan:   Return for Procedure (w/ sedation): (L) Pseudoarticulation BLK #1.  Diagnostic left pseudoarticulation injection #1 between the left L5 transverse process and the left sacral ala for the diagnosis of Bertolotti's syndrome (Left), under fluoroscopic guidance and IV sedation   Interventional management options: Considering:   Diagnostic left L5 transverse process and sacrum pseudoarticulation injection  Diagnostic caudal ESI #2  Possible Racz procedure  Diagnostic coccygeal injection  Diagnostic right-sided L4-5 LESI  Diagnostic right-sided L3-4 LESI  Diagnostic right-sided L2 transforaminal ESI  Diagnostic right-sided L3 transforaminal ESI  Diagnostic right-sided L4 transforaminal ESI  Diagnostic bilateral  Lumbar sympathetic nerve block  Diagnostic bilateral lumbar facet block  Possible bilateral lumbar facet RFA  Diagnostic bilateral sacroiliac joint block   Possible bilateral sacroiliac joint RFA    Palliative PRN treatment(s):   Palliative midline caudal ESIs  (helped with the tailbone pain and the lower extremity pain) Palliative bilateral lumbar facet blocks (helped with the lower extremity spasms in the low back pain)   I discussed the assessment and treatment plan with the patient. The patient was provided an opportunity to ask questions and all were answered. The patient agreed with the plan and demonstrated an understanding of the instructions.  Patient advised to call back or seek an in-person evaluation if the symptoms or condition worsens.  Total duration of non-face-to-face encounter: 25 minutes.  Note by: Joseph Cola, MD Date: 09/22/2018; Time: 3:38 PM  Disclaimer:  * Given the special circumstances of the COVID-19 pandemic, the federal government has announced that the Office for Civil Rights (OCR) will exercise its enforcement discretion and will not impose penalties on physicians using telehealth in the event of noncompliance with regulatory requirements under the [place] and Accountability Act (HIPAA) in connection with the good faith provision of telehealth during the UEKCM-03 national public health emergency. ([place])

## 2018-09-22 ENCOUNTER — Other Ambulatory Visit: Payer: Self-pay

## 2018-09-22 ENCOUNTER — Ambulatory Visit: Payer: Medicare Other | Attending: Pain Medicine | Admitting: Pain Medicine

## 2018-09-22 DIAGNOSIS — M533 Sacrococcygeal disorders, not elsewhere classified: Secondary | ICD-10-CM

## 2018-09-22 DIAGNOSIS — M545 Low back pain, unspecified: Secondary | ICD-10-CM

## 2018-09-22 DIAGNOSIS — Q7649 Other congenital malformations of spine, not associated with scoliosis: Secondary | ICD-10-CM | POA: Diagnosis not present

## 2018-09-22 DIAGNOSIS — M79604 Pain in right leg: Secondary | ICD-10-CM

## 2018-09-22 DIAGNOSIS — G894 Chronic pain syndrome: Secondary | ICD-10-CM | POA: Diagnosis not present

## 2018-09-22 DIAGNOSIS — G8929 Other chronic pain: Secondary | ICD-10-CM

## 2018-09-22 DIAGNOSIS — M79605 Pain in left leg: Secondary | ICD-10-CM

## 2018-09-22 NOTE — Patient Instructions (Signed)

## 2018-09-23 ENCOUNTER — Other Ambulatory Visit: Payer: Medicare Other | Admitting: Urology

## 2018-09-24 ENCOUNTER — Encounter: Payer: Self-pay | Admitting: Family Medicine

## 2018-09-24 NOTE — Telephone Encounter (Signed)
ProAir not on current medication list.  Last office visit 07/08/2018 for CPE.

## 2018-09-25 MED ORDER — ALBUTEROL SULFATE HFA 108 (90 BASE) MCG/ACT IN AERS: 2.0000 | INHALATION_SPRAY | Freq: Four times a day (QID) | RESPIRATORY_TRACT | 3 refills | Status: DC | PRN

## 2018-10-01 ENCOUNTER — Other Ambulatory Visit: Payer: Self-pay | Admitting: *Deleted

## 2018-10-01 DIAGNOSIS — C3411 Malignant neoplasm of upper lobe, right bronchus or lung: Secondary | ICD-10-CM

## 2018-10-01 DIAGNOSIS — G620 Drug-induced polyneuropathy: Secondary | ICD-10-CM

## 2018-10-01 DIAGNOSIS — C3491 Malignant neoplasm of unspecified part of right bronchus or lung: Secondary | ICD-10-CM

## 2018-10-01 DIAGNOSIS — T451X5A Adverse effect of antineoplastic and immunosuppressive drugs, initial encounter: Secondary | ICD-10-CM

## 2018-10-01 MED ORDER — HYDROCODONE-ACETAMINOPHEN 5-325 MG PO TABS: 1.0000 | ORAL_TABLET | Freq: Three times a day (TID) | ORAL | 0 refills | Status: DC | PRN

## 2018-10-01 NOTE — Telephone Encounter (Signed)
Patient called cancer center requesting refill of Norco 5-325 mg tab.   As mandated by the [place] STOP Act (Strengthen Opioid Misuse Prevention), the  Controlled Substance Reporting System ([place]) was reviewed for this patient.  Below is the past 84-months of controlled substance prescriptions as displayed by the registry.  I have personally consulted with my supervising physician, Dr. Grayland Ormond, who agrees that continuation of opiate therapy is medically appropriate at this time and agrees to provide continual monitoring, including urine/blood drug screens, as indicated. Refill is appropriate on or after 09/17/18.  NCCSRS reviewed:    Faythe Casa, NP 10/01/2018 11:34 AM [phone]

## 2018-10-12 ENCOUNTER — Encounter: Payer: Self-pay | Admitting: Internal Medicine

## 2018-10-22 ENCOUNTER — Ambulatory Visit: Payer: Medicare Other

## 2018-10-23 ENCOUNTER — Ambulatory Visit: Payer: Medicare Other | Admitting: Internal Medicine

## 2018-10-23 ENCOUNTER — Other Ambulatory Visit: Payer: Medicare Other

## 2018-11-11 ENCOUNTER — Telehealth: Payer: Self-pay

## 2018-11-11 NOTE — Telephone Encounter (Signed)
He wants a cortisone shot. He would not go into much detail, he said Dr. Dossie Arbour knows who he is so  I will need an order put in for whatever procedure Dr. Dossie Arbour wants him to have.  Thanks

## 2018-11-11 NOTE — Telephone Encounter (Signed)
If I'm looking at it correctly, there is a procedure ordered from his last visit. Please let me know if that is correct.

## 2018-11-13 ENCOUNTER — Other Ambulatory Visit: Payer: Self-pay | Admitting: *Deleted

## 2018-11-13 ENCOUNTER — Telehealth: Payer: Self-pay | Admitting: *Deleted

## 2018-11-13 DIAGNOSIS — T451X5A Adverse effect of antineoplastic and immunosuppressive drugs, initial encounter: Secondary | ICD-10-CM

## 2018-11-13 DIAGNOSIS — C3491 Malignant neoplasm of unspecified part of right bronchus or lung: Secondary | ICD-10-CM

## 2018-11-13 DIAGNOSIS — G620 Drug-induced polyneuropathy: Secondary | ICD-10-CM

## 2018-11-13 DIAGNOSIS — C3411 Malignant neoplasm of upper lobe, right bronchus or lung: Secondary | ICD-10-CM

## 2018-11-13 NOTE — Telephone Encounter (Signed)
I spoke to Joseph Graham regarding his procedure and questions he had. He was on the schedule for Joseph Graham. That was changed back to Joseph Graham. He states he has an oxygen generator and is unable to wear a mask over his nose. He is concerned with Covid and has stayed at home this duration other than going to the grocery store and has a problem breathing through the mask. His appointment was changed to June 23rd at 0930. He will come in the drive through Friday between 1230 and 1330 for his covid test. We discussed the need for his driver to stay in the car. He will be brought to pain management and returned to the medical mall entrance when complete. We will notify his driver when he is ready for discharge. He was informed we will monitor his breathing during the procedure. He verbalizes understanding of the above.

## 2018-11-14 MED ORDER — HYDROCODONE-ACETAMINOPHEN 5-325 MG PO TABS: 1.0000 | ORAL_TABLET | Freq: Three times a day (TID) | ORAL | 0 refills | Status: DC | PRN

## 2018-11-17 ENCOUNTER — Other Ambulatory Visit: Payer: Self-pay | Admitting: Pain Medicine

## 2018-11-26 ENCOUNTER — Other Ambulatory Visit: Payer: Self-pay

## 2018-11-26 ENCOUNTER — Other Ambulatory Visit
Admission: RE | Admit: 2018-11-26 | Discharge: 2018-11-26 | Disposition: A | Discharge status: Home / Self Care | Payer: Medicare Other | Source: Ambulatory Visit | Attending: Pain Medicine | Admitting: Pain Medicine

## 2018-11-26 DIAGNOSIS — Z1159 Encounter for screening for other viral diseases: Secondary | ICD-10-CM | POA: Diagnosis not present

## 2018-11-27 ENCOUNTER — Encounter: Payer: Self-pay | Admitting: Pain Medicine

## 2018-11-27 LAB — NOVEL CORONAVIRUS, NAA (HOSP ORDER, SEND-OUT TO REF LAB; TAT 18-24 HRS): SARS-CoV-2, NAA: NOT DETECTED

## 2018-11-28 ENCOUNTER — Other Ambulatory Visit: Admission: RE | Admit: 2018-11-28 | Payer: Medicare Other | Source: Ambulatory Visit

## 2018-12-02 ENCOUNTER — Other Ambulatory Visit: Payer: Self-pay

## 2018-12-02 ENCOUNTER — Encounter: Payer: Self-pay | Admitting: Pain Medicine

## 2018-12-02 ENCOUNTER — Ambulatory Visit (HOSPITAL_BASED_OUTPATIENT_CLINIC_OR_DEPARTMENT_OTHER): Payer: Medicare Other | Admitting: Pain Medicine

## 2018-12-02 ENCOUNTER — Ambulatory Visit
Admission: RE | Admit: 2018-12-02 | Discharge: 2018-12-02 | Disposition: A | Discharge status: Home / Self Care | Payer: Medicare Other | Source: Ambulatory Visit | Attending: Pain Medicine | Admitting: Pain Medicine

## 2018-12-02 VITALS — BP 142/75 | HR 61 | Temp 98.1°F | Resp 16 | Ht 69.0 in | Wt 188.0 lb

## 2018-12-02 DIAGNOSIS — M545 Low back pain: Secondary | ICD-10-CM

## 2018-12-02 DIAGNOSIS — M5137 Other intervertebral disc degeneration, lumbosacral region: Secondary | ICD-10-CM

## 2018-12-02 DIAGNOSIS — Q7649 Other congenital malformations of spine, not associated with scoliosis: Secondary | ICD-10-CM | POA: Diagnosis not present

## 2018-12-02 DIAGNOSIS — G8929 Other chronic pain: Secondary | ICD-10-CM | POA: Insufficient documentation

## 2018-12-02 MED ORDER — FENTANYL CITRATE (PF) 100 MCG/2ML IJ SOLN: 25.0000 ug | INTRAMUSCULAR | Status: DC | PRN

## 2018-12-02 MED ORDER — MIDAZOLAM HCL 5 MG/5ML IJ SOLN: 1.0000 mg | INTRAMUSCULAR | Status: DC | PRN

## 2018-12-02 MED ORDER — METHYLPREDNISOLONE ACETATE 80 MG/ML IJ SUSP
80.0000 mg | Freq: Once | INTRAMUSCULAR | Status: AC
  Administered 2018-12-02: 10:00:00 80 mg via INTRA_ARTICULAR
  Filled 2018-12-02: qty 1

## 2018-12-02 MED ORDER — LACTATED RINGERS IV SOLN: 1000.0000 mL | Freq: Once | INTRAVENOUS | Status: DC

## 2018-12-02 MED ORDER — LIDOCAINE HCL 2 % IJ SOLN
20.0000 mL | Freq: Once | INTRAMUSCULAR | Status: AC
  Administered 2018-12-02: 400 mg
  Filled 2018-12-02: qty 20

## 2018-12-02 MED ORDER — ROPIVACAINE HCL 2 MG/ML IJ SOLN
9.0000 mL | Freq: Once | INTRAMUSCULAR | Status: AC
  Administered 2018-12-02: 9 mL via INTRA_ARTICULAR
  Filled 2018-12-02: qty 10

## 2018-12-02 NOTE — Progress Notes (Signed)
Patient's Name: Joseph Graham  MRN: [id number]  Referring Provider: [name], MD  DOB: 05-31-1941  PCP: [name], MD  DOS: 12/02/2018  Note by: [name], MD  Service setting: Ambulatory outpatient  Specialty: Interventional Pain Management  Patient type: Established  Location: [facility] (AMB) Pain Management Facility  Visit type: Interventional Procedure   Primary Reason for Visit: Interventional Pain Management Treatment. CC: Back Pain (lower left), Shoulder Pain (bilateral ), and Generalized Body Aches (patient states he hurts all over )  Procedure:          Anesthesia, Analgesia, Anxiolysis:  Type: Diagnostic left Sacro-transverse process (L5) Pseudoarticulation Joint Steroid Injection #1  Region: Superior Lumbosacral Region Level: PSIS (Posterior Superior Iliac Spine) Laterality: Left-Side  Type: Local Anesthesia Indication(s): Analgesia         Route: Infiltration ([place]/IM) IV Access: Declined Sedation: Declined  Local Anesthetic: Lidocaine 1-2%  Position: Prone           Indications: 1. Bertolotti's syndrome (Left)   2. Chronic low back pain (Primary area of Pain) (Bilateral)  (L>R) w/o sciatica   3. DDD (degenerative disc disease), lumbosacral    Pain Score: Pre-procedure: 6 /10 Post-procedure: 7 /10  Pre-op Assessment:  Mr. Hornbaker is a 78 y.o. (year old), male patient, seen today for interventional treatment. He  has a past surgical history that includes Hemorroidectomy (1982); A flutter ablation (03/2010); Cataract extraction; carotid US (03/2010); Colonoscopy (09/2011); Tonsillectomy (1964); Electormagnetic navigation bronchoscopy (N/A, 03/02/2015); Transurethral resection of bladder tumor (N/A, 04/06/2015); Cystoscopy w/ retrogrades (Bilateral, 04/06/2015); urinary stent removal (04/14/15); Transurethral resection of bladder tumor (N/A, 07/13/2015); Portacath placement (N/A, 08/10/2015); Port-a-cath removal (Right, 04/03/2016); and Polypectomy. Mr. Heffington  has a current medication list which includes the following prescription(s): acetaminophen, albuterol, cholecalciferol, cyanocobalamin, gabapentin, hydrocodone-acetaminophen, lisinopril, metoprolol tartrate, multi-vitamins, NON FORMULARY, NONFORMULARY OR COMPOUNDED ITEM, omeprazole, polyethylene glycol, primidone, and vitamin e, and the following Facility-Administered Medications: fentanyl, lactated ringers, and midazolam. His primarily concern today is the Back Pain (lower left), Shoulder Pain (bilateral ), and Generalized Body Aches (patient states he hurts all over )  Initial Vital Signs:  Pulse/HCG Rate: 66  Temp: 98.1 F (36.7 C) Resp: 16 BP: (!) 150/58 SpO2: 94 %(2 liters)  BMI: Estimated body mass index is 27.76 kg/m as calculated from the following:   Height as of this encounter: 5\' 9"  (1.753 m).   Weight as of this encounter: 188 lb (85.3 kg).  Risk Assessment: Allergies: Reviewed. He has No Known Allergies.  Allergy Precautions: None required Coagulopathies: Reviewed. None identified.  Blood-thinner therapy: None at this time Active Infection(s): Reviewed. None identified. Mr. Yohannes is afebrile  Site Confirmation: Mr. Warehime was asked to confirm the procedure and laterality before marking the site Procedure checklist: Completed Consent: Before the procedure and under the influence of no sedative(s), amnesic(s), or anxiolytics, the patient was informed of the treatment options, risks and possible complications. To fulfill our ethical and legal obligations, as recommended by the American Medical Association's Code of Ethics, I have informed the patient of my clinical impression; the nature and purpose of the treatment or procedure; the risks, benefits, and possible complications of the intervention; the alternatives, including doing nothing; the risk(s) and benefit(s) of the alternative treatment(s) or procedure(s); and the risk(s) and benefit(s) of doing nothing. The patient was  provided information about the general risks and possible complications associated with the procedure. These may include, but are not limited to: failure to achieve desired goals, infection, bleeding, organ or  nerve damage, allergic reactions, paralysis, and death. In addition, the patient was informed of those risks and complications associated to the procedure, such as failure to decrease pain; infection; bleeding; organ or nerve damage with subsequent damage to sensory, motor, and/or autonomic systems, resulting in permanent pain, numbness, and/or weakness of one or several areas of the body; allergic reactions; (i.e.: anaphylactic reaction); and/or death. Furthermore, the patient was informed of those risks and complications associated with the medications. These include, but are not limited to: allergic reactions (i.e.: anaphylactic or anaphylactoid reaction(s)); adrenal axis suppression; blood sugar elevation that in diabetics may result in ketoacidosis or comma; water retention that in patients with history of congestive heart failure may result in shortness of breath, pulmonary edema, and decompensation with resultant heart failure; weight gain; swelling or edema; medication-induced neural toxicity; particulate matter embolism and blood vessel occlusion with resultant organ, and/or nervous system infarction; and/or aseptic necrosis of one or more joints. Finally, the patient was informed that Medicine is not an exact science; therefore, there is also the possibility of unforeseen or unpredictable risks and/or possible complications that may result in a catastrophic outcome. The patient indicated having understood very clearly. We have given the patient no guarantees and we have made no promises. Enough time was given to the patient to ask questions, all of which were answered to the patient's satisfaction. Mr. Los has indicated that he wanted to continue with the procedure. Attestation: I, the  ordering provider, attest that I have discussed with the patient the benefits, risks, side-effects, alternatives, likelihood of achieving goals, and potential problems during recovery for the procedure that I have provided informed consent. Date  Time: 12/02/2018  9:28 AM  Pre-Procedure Preparation:  Monitoring: As per clinic protocol. Respiration, ETCO2, SpO2, BP, heart rate and rhythm monitor placed and checked for adequate function Safety Precautions: Patient was assessed for positional comfort and pressure points before starting the procedure. Time-out: I initiated and conducted the "Time-out" before starting the procedure, as per protocol. The patient was asked to participate by confirming the accuracy of the "Time Out" information. Verification of the correct person, site, and procedure were performed and confirmed by me, the nursing staff, and the patient. "Time-out" conducted as per Joint Commission's Universal Protocol (UP.01.01.01). Time: 1021  Description of Procedure:          Target Area: Superior, posterior, aspect of the sacroiliac fissure Approach: Posterior, paraspinal, ipsilateral approach. Area Prepped: Entire Lower Lumbosacral Region Prepping solution: DuraPrep (Iodine Povacrylex [0.7% available iodine] and Isopropyl Alcohol, 74% w/w) Safety Precautions: Aspiration looking for blood return was conducted prior to all injections. At no point did we inject any substances, as a needle was being advanced. No attempts were made at seeking any paresthesias. Safe injection practices and needle disposal techniques used. Medications properly checked for expiration dates. SDV (single dose vial) medications used. Description of the Procedure: Protocol guidelines were followed. The patient was placed in position over the procedure table. The target area was identified and the area prepped in the usual manner. Skin & deeper tissues infiltrated with local anesthetic. Appropriate amount of time  allowed to pass for local anesthetics to take effect. The procedure needle was advanced under fluoroscopic guidance into the sacroiliac joint until a firm endpoint was obtained. Proper needle placement secured. Negative aspiration confirmed. Solution injected in intermittent fashion, asking for systemic symptoms every 0.5cc of injectate. The needles were then removed and the area cleansed, making sure to leave some of the prepping solution  back to take advantage of its long term bactericidal properties. Vitals:   12/02/18 0927 12/02/18 1018 12/02/18 1023 12/02/18 1030  BP: (!) 150/58 127/75 (!) 151/74 (!) 142/75  Pulse: 66 65 62 61  Resp: 16 16 17 16   Temp: 98.1 F (36.7 C)     TempSrc: Oral     SpO2: 94% 96% 97% 97%  Weight: 188 lb (85.3 kg)     Height: 5\' 9"  (1.753 m)       Start Time: 1021 hrs. End Time: 1026 hrs. Materials:  Needle(s) Type: Spinal Needle Gauge: 22G Length: 3.5-in Medication(s): Please see orders for medications and dosing details.  Imaging Guidance (Non-Spinal):          Type of Imaging Technique: Fluoroscopy Guidance (Non-Spinal) Indication(s): Assistance in needle guidance and placement for procedures requiring needle placement in or near specific anatomical locations not easily accessible without such assistance. Exposure Time: Please see nurses notes. Contrast: Before injecting any contrast, we confirmed that the patient did not have an allergy to iodine, shellfish, or radiological contrast. Once satisfactory needle placement was completed at the desired level, radiological contrast was injected. Contrast injected under live fluoroscopy. No contrast complications. See chart for type and volume of contrast used. Fluoroscopic Guidance: I was personally present during the use of fluoroscopy. "Tunnel Vision Technique" used to obtain the best possible view of the target area. Parallax error corrected before commencing the procedure. "Direction-depth-direction" technique  used to introduce the needle under continuous pulsed fluoroscopy. Once target was reached, antero-posterior, oblique, and lateral fluoroscopic projection used confirm needle placement in all planes. Images permanently stored in EMR. Interpretation: I personally interpreted the imaging intraoperatively. Adequate needle placement confirmed in multiple planes. Appropriate spread of contrast into desired area was observed. No evidence of afferent or efferent intravascular uptake. Permanent images saved into the patient's record.  Antibiotic Prophylaxis:   Anti-infectives (From admission, onward)   None     Indication(s): None identified  Post-operative Assessment:  Post-procedure Vital Signs:  Pulse/HCG Rate: 61  Temp: 98.1 F (36.7 C) Resp: 16 BP: (!) 142/75 SpO2: 97 %  EBL: None  Complications: No immediate post-treatment complications observed by team, or reported by patient.  Note: The patient tolerated the entire procedure well. A repeat set of vitals were taken after the procedure and the patient was kept under observation following institutional policy, for this type of procedure. Post-procedural neurological assessment was performed, showing return to baseline, prior to discharge. The patient was provided with post-procedure discharge instructions, including a section on how to identify potential problems. Should any problems arise concerning this procedure, the patient was given instructions to immediately contact us, at any time, without hesitation. In any case, we plan to contact the patient by telephone for a follow-up status report regarding this interventional procedure.  Comments:  No additional relevant information.  Plan of Care  Orders:  Orders Placed This Encounter  Procedures  . SACROILIAC JOINT INJECTION    Scheduling Instructions:     Side: Left-sided     Sedation: With Sedation.     Timeframe: Today    Order Specific Question:   Where will this procedure be  performed?    Answer:   [facility]  . DG PAIN [facility] C-ARM 1-60 MIN NO REPORT    Intraoperative interpretation by procedural physician at [place].    Standing Status:   Standing    Number of Occurrences:   1    Order Specific Question:  Reason for exam:    Answer:   Assistance in needle guidance and placement for procedures requiring needle placement in or near specific anatomical locations not easily accessible without such assistance.  . Provider attestation of informed consent for procedure/surgical case    I, the ordering provider, attest that I have discussed with the patient the benefits, risks, side effects, alternatives, likelihood of achieving goals and potential problems during recovery for the procedure that I have provided informed consent.    Standing Status:   Standing    Number of Occurrences:   1  . Informed Consent Details: Transcribe to consent form and obtain patient signature    Consent Attestation: I, the ordering provider, attest that I have discussed with the patient the benefits, risks, side-effects, alternatives, likelihood of achieving goals, and potential problems during recovery for the procedure that I have provided informed consent.    Standing Status:   Standing    Number of Occurrences:   1    Order Specific Question:   Procedure    Answer:   Diagnostic, left sided, sacroiliac joint pseudoarticulation block under fluoroscopic guidance    Order Specific Question:   Surgeon    Answer:   Toini Failla A. Dossie Arbour, MD    Order Specific Question:   Indication/Reason    Answer:   Low back pain secondary sleep to Bertolotti's Syndrome   Medications ordered for procedure: Meds ordered this encounter  Medications  . lidocaine (XYLOCAINE) 2 % (with pres) injection 400 mg  . lactated ringers infusion 1,000 mL  . midazolam (VERSED) 5 MG/5ML injection 1-2 mg    Make sure Flumazenil is available in the pyxis when using this medication. If  oversedation occurs, administer 0.2 mg IV over 15 sec. If after 45 sec no response, administer 0.2 mg again over 1 min; may repeat at 1 min intervals; not to exceed 4 doses (1 mg)  . fentaNYL (SUBLIMAZE) injection 25-50 mcg    Make sure Narcan is available in the pyxis when using this medication. In the event of respiratory depression (RR< 8/min): Titrate NARCAN (naloxone) in increments of 0.1 to 0.2 mg IV at 2-3 minute intervals, until desired degree of reversal.  . methylPREDNISolone acetate (DEPO-MEDROL) injection 80 mg  . ropivacaine (PF) 2 mg/mL (0.2%) (NAROPIN) injection 9 mL   Medications administered: We administered lidocaine, methylPREDNISolone acetate, and ropivacaine (PF) 2 mg/mL (0.2%).  See the medical record for exact dosing, route, and time of administration.  Disposition: Discharge home  Discharge Date & Time: 12/02/2018; 1033 hrs.   Follow-up plan:   Return in about 2 weeks (around 12/16/2018) for (VV), E/M (PP).     Recent Visits Date Type Provider Dept  09/22/18 Office Visit Milinda Pointer, MD [facility]  Showing recent visits within past 90 days and meeting all other requirements   Today's Visits Date Type Provider Dept  12/02/18 Procedure visit Milinda Pointer, MD [facility]  Showing today's visits and meeting all other requirements   Future Appointments Date Type Provider Dept  12/31/18 Appointment Milinda Pointer, MD [facility]  Showing future appointments within next 90 days and meeting all other requirements   Primary Care Physician: [name], MD Location: [facility] Note by: [name], MD Date: 12/02/2018; Time: 12:50 PM  Disclaimer:  Medicine is not an exact science. The only guarantee in medicine is that nothing is guaranteed. It is important to note that the decision to proceed with this intervention was  based on the information collected from the patient. The  Data and conclusions were drawn from the patient's questionnaire, the interview, and the physical examination. Because the information was provided in large part by the patient, it cannot be guaranteed that it has not been purposely or unconsciously manipulated. Every effort has been made to obtain as much relevant data as possible for this evaluation. It is important to note that the conclusions that lead to this procedure are derived in large part from the available data. Always take into account that the treatment will also be dependent on availability of resources and existing treatment guidelines, considered by other Pain Management Practitioners as being common knowledge and practice, at the time of the intervention. For Medico-Legal purposes, it is also important to point out that variation in procedural techniques and pharmacological choices are the acceptable norm. The indications, contraindications, technique, and results of the above procedure should only be interpreted and judged by a Board-Certified Interventional Pain Specialist with extensive familiarity and expertise in the same exact procedure and technique.

## 2018-12-02 NOTE — Patient Instructions (Signed)

## 2018-12-03 ENCOUNTER — Telehealth: Payer: Self-pay | Admitting: *Deleted

## 2018-12-03 ENCOUNTER — Ambulatory Visit: Payer: Medicare Other | Admitting: Student in an Organized Health Care Education/Training Program

## 2018-12-03 NOTE — Telephone Encounter (Signed)
Spoke with patient verbalizes no questions or concerns re; procedure on yesterday.

## 2018-12-04 DIAGNOSIS — E559 Vitamin D deficiency, unspecified: Secondary | ICD-10-CM | POA: Diagnosis not present

## 2018-12-04 DIAGNOSIS — M6281 Muscle weakness (generalized): Secondary | ICD-10-CM | POA: Diagnosis not present

## 2018-12-17 ENCOUNTER — Encounter: Payer: Self-pay | Admitting: Family Medicine

## 2018-12-17 ENCOUNTER — Encounter: Payer: Self-pay | Admitting: Pain Medicine

## 2018-12-17 ENCOUNTER — Ambulatory Visit: Payer: Medicare Other | Admitting: Radiation Oncology

## 2018-12-17 NOTE — Telephone Encounter (Signed)
May I offer the pt a virtual visit or are you ok for him to wait?

## 2018-12-17 NOTE — Telephone Encounter (Signed)
Yes plz call and offer virtual visit for end of the month. If pt declines, ok to just keep next 06/2019 physical.

## 2018-12-19 NOTE — Telephone Encounter (Signed)
Pt will do MyChart visit on 01/06/19 @ 9:15am

## 2018-12-23 NOTE — Telephone Encounter (Signed)
We will just discuss at [place].

## 2018-12-23 NOTE — Telephone Encounter (Signed)
Do you want pt to just have POC A1c before 7/28 visit?

## 2018-12-23 NOTE — Telephone Encounter (Signed)
Noted  

## 2018-12-30 ENCOUNTER — Other Ambulatory Visit: Payer: Self-pay | Admitting: *Deleted

## 2018-12-30 ENCOUNTER — Encounter: Payer: Self-pay | Admitting: Pain Medicine

## 2018-12-30 DIAGNOSIS — C3491 Malignant neoplasm of unspecified part of right bronchus or lung: Secondary | ICD-10-CM

## 2018-12-30 DIAGNOSIS — G620 Drug-induced polyneuropathy: Secondary | ICD-10-CM

## 2018-12-30 DIAGNOSIS — C3411 Malignant neoplasm of upper lobe, right bronchus or lung: Secondary | ICD-10-CM

## 2018-12-30 MED ORDER — HYDROCODONE-ACETAMINOPHEN 5-325 MG PO TABS: 1.0000 | ORAL_TABLET | Freq: Three times a day (TID) | ORAL | 0 refills | Status: DC | PRN

## 2018-12-30 NOTE — Progress Notes (Signed)
Pain Management Virtual Encounter Note - Virtual Visit via Telephone Telehealth (real-time audio visits between healthcare provider and patient).   Patient's Phone No. & Preferred Pharmacy:  [phone] (home); [phone] (mobile); (Preferred) [phone] [email or web address]  [id number] - [place], [place] [address] [place] [address] Phone: [phone] Fax: [phone]    Pre-screening note:  Our staff contacted Joseph Graham and offered him an "in person", "face-to-face" appointment versus a telephone encounter. He indicated preferring the telephone encounter, at this time.   Reason for Virtual Visit: COVID-19*  Social distancing based on CDC and AMA recommendations.   I contacted [name] on 12/31/2018 via telephone.      I clearly identified myself as Joseph Cola, MD. I verified that I was speaking with the correct person using two identifiers (Name: Joseph Graham, and date of birth: 09-22-1940).  Advanced Informed Consent I sought verbal advanced consent from [name] for virtual visit interactions. I informed Mr. Nadeem of possible security and privacy concerns, risks, and limitations associated with providing "not-in-person" medical evaluation and management services. I also informed Joseph Graham of the availability of "in-person" appointments. Finally, I informed him that there would be a charge for the virtual visit and that he could be  personally, fully or partially, financially responsible for it. Joseph Graham expressed understanding and agreed to proceed.   Historic Elements   [name] is a 78 y.o. year old, male patient evaluated today after his last encounter by our practice on 12/03/2018. Joseph Graham  has a past medical history of Alcohol abuse, in remission, Arthritis, Benign essential tremor, BPH (benign prostatic hypertrophy) (12/30/2012),  Cardiac arrhythmia (03/2010), Cataract, Chemotherapy-induced neutropenia ([place]) (09/23/2015), Chronic low back pain, Colon polyps, Complication of anesthesia, COPD (chronic obstructive pulmonary disease) ([place]), Coronary artery disease, Diabetes mellitus without complication ([place]), Dyslipidemia, GERD (gastroesophageal reflux disease), HTN (hypertension), Macular degeneration, bilateral, Multiple pulmonary nodules (02/2015), Neuromuscular disorder ([place]), Peripheral neuropathy due to chemotherapy ([place]) (12/23/2015), Personal history of tobacco use, presenting hazards to health (02/18/2015), Pneumothorax after biopsy (04/01/2015), Primary cancer of bladder ([place]) (2016), Primary lung cancer ([place]) (2016), and Shortness of breath dyspnea. He also  has a past surgical history that includes Hemorroidectomy (1982); A flutter ablation (03/2010); Cataract extraction; carotid US (03/2010); Colonoscopy (09/2011); Tonsillectomy (1964); Electormagnetic navigation bronchoscopy (N/A, 03/02/2015); Transurethral resection of bladder tumor (N/A, 04/06/2015); Cystoscopy w/ retrogrades (Bilateral, 04/06/2015); urinary stent removal (04/14/15); Transurethral resection of bladder tumor (N/A, 07/13/2015); Portacath placement (N/A, 08/10/2015); Port-a-cath removal (Right, 04/03/2016); and Polypectomy. Joseph Graham has a current medication list which includes the following prescription(s): acetaminophen, albuterol, cholecalciferol, cyanocobalamin, gabapentin, lisinopril, magnesium, metoprolol tartrate, multi-vitamins, NON FORMULARY, omeprazole, polyethylene glycol, primidone, vitamin e, and hydrocodone-acetaminophen. He  reports that he quit smoking about 3 years ago. His smoking use included cigarettes. He has a 91.50 pack-year smoking history. He has never used smokeless tobacco. He reports that he does not drink alcohol or use drugs. Joseph Graham has No Known Allergies.   HPI  Today, he is being contacted for a post-procedure assessment.  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  Today we will be referring the patient to neurosurgery at the [facility] to see if the neurosurgeons can going and remove part of his left L5 transverse process so asked to eliminate this pseudoarthrosis  and the low back pain, along with it.  In addition, the patient indicates that his tailbone pain has began to come back.  On 08/07/2018 we did a caudal epidural steroid injection which provided him with 100% relief of the pain that has lasted until recently when it began to come back.  He would like to have this repeated as soon as possible.  We will go ahead and schedule him for his caudal epidural steroid injection #2.  Post-Procedure Evaluation  Procedure: Diagnostic left Sacro-transverse process (L5) Pseudoarticulation Joint Steroid Injection #1  under fluoroscopic guidance, no sedation Pre-procedure pain level:  6/10 Post-procedure: 7/10 No initial benefit, possibly due to rapid discharge after no sedation procedure, without enough time to allow full onset of block.  Sedation: None.  Effectiveness during initial hour after procedure(Ultra-Short Term Relief): 100 %   Local anesthetic used: Long-acting (4-6 hours) Effectiveness: Defined as any analgesic benefit obtained secondary to the administration of local anesthetics. This carries significant diagnostic value as to the etiological location, or anatomical origin, of the pain. Duration of benefit is expected to coincide with the duration of the local anesthetic used.  Effectiveness during initial 4-6 hours after procedure(Short-Term Relief): 100 %   Long-term benefit: Defined as any relief past the pharmacologic duration of the local anesthetics.  Effectiveness past the initial 6 hours after procedure(Long-Term Relief): 0 %(patient states that the pain started returning the next day and continues to get worse and at  this point is worse than it was.  ADL's are very difficult to accomplish.)   Current benefits: Defined as benefit that persist at this time.   Analgesia:  No long-term benefit from diagnostic injection. Function: Back to baseline ROM: Back to baseline  Pharmacotherapy Assessment  Analgesic: No opioid analgesics prescribed by our practice. Hydrocodone/APAP 5/325 mg, 1 tab PO q8 hrs (15 mg/day of hydrocodone) (Last prescribed on 11/14/2018, by Verlon Au, NP. MME/day:15mg /day.   Monitoring: Pharmacotherapy: No side-effects or adverse reactions reported. [place] PMP: PDMP reviewed during this encounter.       Compliance: No problems identified. Effectiveness: Clinically acceptable. Plan: Refer to "POC".  Pertinent Labs   SAFETY SCREENING Profile Lab Results  Component Value Date   SARSCOV2NAA NOT DETECTED 11/26/2018   COVIDSOURCE NASOPHARYNGEAL 11/26/2018   Renal Function Lab Results  Component Value Date   BUN 22 07/14/2018   CREATININE 0.63 (L) 07/14/2018   BCR 35 (H) 07/14/2018   GFRAA 109 07/14/2018   GFRNONAA 94 07/14/2018   Hepatic Function Lab Results  Component Value Date   AST 19 07/14/2018   ALT 19 07/04/2018   ALBUMIN 4.6 07/14/2018   UDS Summary  Date Value Ref Range Status  07/14/2018 FINAL  Final    Comment:    ==================================================================== TOXASSURE COMP DRUG ANALYSIS,UR ==================================================================== Test                             Result       Flag       Units Drug Present and Declared for Prescription Verification   Hydrocodone                    185          EXPECTED   ng/mg creat   Hydromorphone                  207          EXPECTED  ng/mg creat   Norhydrocodone                 563          EXPECTED   ng/mg creat    Sources of hydrocodone include scheduled prescription    medications. Hydromorphone and norhydrocodone are expected    metabolites of hydrocodone.  Hydromorphone is also available as a    scheduled prescription medication.   Primidone                      PRESENT      EXPECTED   Phenobarbital                  PRESENT      EXPECTED    Phenobarbital is an expected metabolite of primidone;    Phenobarbital may also be administered as a prescription drug.   Gabapentin                     PRESENT      EXPECTED   Acetaminophen                  PRESENT      EXPECTED Drug Absent but Declared for Prescription Verification   Metoprolol                     Not Detected UNEXPECTED ==================================================================== Test                      Result    Flag   Units      Ref Range   Creatinine              96               mg/dL      >=20 ==================================================================== Declared Medications:  The flagging and interpretation on this report are based on the  following declared medications.  Unexpected results may arise from  inaccuracies in the declared medications.  **Note: The testing scope of this panel includes these medications:  Gabapentin  Hydrocodone (Hydrocodone-Acetaminophen)  Metoprolol (Lopressor)  Primidone (Mysoline)  **Note: The testing scope of this panel does not include small to  moderate amounts of these reported medications:  Acetaminophen  Acetaminophen (Hydrocodone-Acetaminophen)  **Note: The testing scope of this panel does not include following  reported medications:  Cannabidiol  Cholecalciferol  Cyanocobalamin  Lisinopril  Multivitamin  Omeprazole (Prilosec)  Polyethylene Glycol  Vitamin E ==================================================================== For clinical consultation, please call [phone]. ====================================================================    Note: Above Lab results reviewed.  Recent imaging  DG PAIN CLINIC C-ARM 1-60 MIN NO REPORT Fluoro was used, but no Radiologist interpretation will be  provided.  Please refer to "NOTES" tab for provider progress note.  Assessment  The primary encounter diagnosis was Chronic pain syndrome. Diagnoses of Chronic low back pain (Primary area of Pain) (Bilateral)  (L>R) w/o sciatica, Chronic coccyx pain (Secondary Area of Pain) (Midline), Chronic lower extremity pain (Tertiary Area of Pain) (Bilateral) (R>L), Bertolotti's syndrome (Left), and Coccygodynia were also pertinent to this visit.  Plan of Care  I have discontinued Ihsan L. Canova "Jim"'s NONFORMULARY OR COMPOUNDED ITEM. I am also having him maintain his Multi-Vitamins, Cyanocobalamin (B-12 PO), VITAMIN E PO, Cholecalciferol (VITAMIN D3 PO), Acetaminophen (TYLENOL EXTRA STRENGTH PO), omeprazole, polyethylene glycol, NON FORMULARY, gabapentin, lisinopril, metoprolol tartrate, albuterol, primidone, and Magnesium.  Pharmacotherapy (Medications Ordered): No orders of the defined types were placed  in this encounter.  Orders:  Orders Placed This Encounter  Procedures  . Caudal Epidural Injection    Standing Status:   Future    Standing Expiration Date:   01/31/2019    Scheduling Instructions:     Laterality: Midline     Level(s): Sacrococcygeal canal (Tailbone area)     Sedation: With Sedation     Scheduling Timeframe: As soon as pre-approved    Order Specific Question:   Where will this procedure be performed?    Answer:   [facility]  . Ambulatory referral to Neurosurgery    Referral Priority:   Routine    Referral Type:   Surgical    Referral Reason:   Specialty Services Required    Requested Specialty:   Neurosurgery    Number of Visits Requested:   1   Follow-up plan:   Return for Procedure (w/ sedation): (ML) Caudal ESI #2, (ASAP).  The patient has been referred to [facility] neurosurgery for surgical treatment of his left Bertolotti's syndrome.    Interventional management options: Considering: Diagnosticleft L5 transverse process and  sacrumpseudoarticulationinjection #2  Possible Racz procedure Diagnostic coccygeal injection DiagnosticrightL4-5 LESI Diagnostic right L3-4 LESI Diagnostic right L2 TFESI Diagnostic right L3 TFESI Diagnostic right L4 TFESI DiagnosticbilateralLSB Diagnostic bilateral lumbar facet block #2 Possible bilateral lumbar facetRFA Diagnostic bilateral S-Iblock Possible bilateral S-I jointRFA   Palliative PRN treatment(s): Palliative midline caudal ESIs #2 (100/100/100/100) (helped w/ coccygodynia & LEP) Palliative bilateral lumbar facet blocks #2 (100/100/90/90) (helped w/ LE spasms & LBP)    Recent Visits Date Type Provider Dept  12/02/18 Procedure visit Milinda Pointer, MD [facility]  Showing recent visits within past 90 days and meeting all other requirements   Today's Visits Date Type Provider Dept  12/31/18 Office Visit Milinda Pointer, MD [facility]  Showing today's visits and meeting all other requirements   Future Appointments No visits were found meeting these conditions.  Showing future appointments within next 90 days and meeting all other requirements   I discussed the assessment and treatment plan with the patient. The patient was provided an opportunity to ask questions and all were answered. The patient agreed with the plan and demonstrated an understanding of the instructions.  Patient advised to call back or seek an in-person evaluation if the symptoms or condition worsens.  Total duration of non-face-to-face encounter: 15 minutes.  Note by: Joseph Cola, MD Date: 12/31/2018; Time: 9:17 AM  Note: This dictation was prepared with Dragon dictation. Any transcriptional errors that may result from this process are unintentional.  Disclaimer:  * Given the special circumstances of the COVID-19 pandemic, the federal government has announced that the Office for Civil Rights (OCR) will exercise its enforcement  discretion and will not impose penalties on physicians using telehealth in the event of noncompliance with regulatory requirements under the [place] (HIPAA) in connection with the good faith provision of telehealth during the MWNUU-72 national public health emergency. ([place])

## 2018-12-30 NOTE — Telephone Encounter (Signed)
Patient called [facility] requesting refill of norco.   As mandated by the [place] STOP Act (Strengthen Opioid Misuse Prevention), the [place] Controlled Substance Reporting System ([place]) was reviewed for this patient.  Below is the past 49-months of controlled substance prescriptions as displayed by the registry.  I have personally consulted with my supervising physician, Dr. Rogue Bussing, who agrees that continuation of opiate therapy is medically appropriate at this time and agrees to provide continual monitoring, including urine/blood drug screens, as indicated. Prescription sent electronically using [employer] secure transmission to requested pharmacy.   [place] Reviewed & PDMP updated in epic.   Beckey Rutter, DNP, AGNP-C [place] at [facility] [phone] (work cell) [phone] (office)

## 2018-12-31 ENCOUNTER — Ambulatory Visit: Payer: Medicare Other | Attending: Pain Medicine | Admitting: Pain Medicine

## 2018-12-31 ENCOUNTER — Other Ambulatory Visit: Payer: Self-pay

## 2018-12-31 DIAGNOSIS — M545 Low back pain, unspecified: Secondary | ICD-10-CM

## 2018-12-31 DIAGNOSIS — M79605 Pain in left leg: Secondary | ICD-10-CM

## 2018-12-31 DIAGNOSIS — M533 Sacrococcygeal disorders, not elsewhere classified: Secondary | ICD-10-CM

## 2018-12-31 DIAGNOSIS — G894 Chronic pain syndrome: Secondary | ICD-10-CM | POA: Diagnosis not present

## 2018-12-31 DIAGNOSIS — M79604 Pain in right leg: Secondary | ICD-10-CM | POA: Diagnosis not present

## 2018-12-31 DIAGNOSIS — Q7649 Other congenital malformations of spine, not associated with scoliosis: Secondary | ICD-10-CM

## 2018-12-31 DIAGNOSIS — G8929 Other chronic pain: Secondary | ICD-10-CM

## 2018-12-31 NOTE — Patient Instructions (Signed)

## 2019-01-01 ENCOUNTER — Encounter: Payer: Self-pay | Admitting: Family Medicine

## 2019-01-02 NOTE — Telephone Encounter (Signed)
Till when can patient post pone his visit?

## 2019-01-02 NOTE — Telephone Encounter (Signed)
Ok to cancel.  If pt prefers, just keep 07/2019 appt.

## 2019-01-02 NOTE — Telephone Encounter (Signed)
Patient advised. Keeping appt in 2021

## 2019-01-06 ENCOUNTER — Other Ambulatory Visit: Payer: Self-pay | Admitting: Neurosurgery

## 2019-01-06 ENCOUNTER — Telehealth: Payer: Medicare Other | Admitting: Family Medicine

## 2019-01-06 DIAGNOSIS — Q7649 Other congenital malformations of spine, not associated with scoliosis: Secondary | ICD-10-CM

## 2019-01-15 ENCOUNTER — Encounter: Payer: Self-pay | Admitting: Pain Medicine

## 2019-01-15 ENCOUNTER — Ambulatory Visit
Admission: RE | Admit: 2019-01-15 | Discharge: 2019-01-15 | Disposition: A | Discharge status: Home / Self Care | Payer: Medicare Other | Source: Ambulatory Visit | Attending: Pain Medicine | Admitting: Pain Medicine

## 2019-01-15 ENCOUNTER — Other Ambulatory Visit: Payer: Self-pay

## 2019-01-15 ENCOUNTER — Ambulatory Visit (HOSPITAL_BASED_OUTPATIENT_CLINIC_OR_DEPARTMENT_OTHER): Payer: Medicare Other | Admitting: Pain Medicine

## 2019-01-15 VITALS — BP 123/60 | HR 63 | Temp 97.5°F | Resp 15 | Ht 69.0 in | Wt 193.0 lb

## 2019-01-15 DIAGNOSIS — M533 Sacrococcygeal disorders, not elsewhere classified: Secondary | ICD-10-CM | POA: Insufficient documentation

## 2019-01-15 DIAGNOSIS — M488X8 Other specified spondylopathies, sacral and sacrococcygeal region: Secondary | ICD-10-CM | POA: Diagnosis not present

## 2019-01-15 DIAGNOSIS — M5137 Other intervertebral disc degeneration, lumbosacral region: Secondary | ICD-10-CM | POA: Diagnosis not present

## 2019-01-15 MED ORDER — LIDOCAINE HCL 2 % IJ SOLN
20.0000 mL | Freq: Once | INTRAMUSCULAR | Status: AC
  Administered 2019-01-15: 400 mg
  Filled 2019-01-15: qty 40

## 2019-01-15 MED ORDER — TRIAMCINOLONE ACETONIDE 40 MG/ML IJ SUSP
40.0000 mg | Freq: Once | INTRAMUSCULAR | Status: AC
  Administered 2019-01-15: 10:00:00 40 mg
  Filled 2019-01-15: qty 1

## 2019-01-15 MED ORDER — FENTANYL CITRATE (PF) 100 MCG/2ML IJ SOLN
25.0000 ug | INTRAMUSCULAR | Status: DC | PRN
  Administered 2019-01-15: 50 ug via INTRAVENOUS
  Filled 2019-01-15: qty 2

## 2019-01-15 MED ORDER — MIDAZOLAM HCL 5 MG/5ML IJ SOLN
1.0000 mg | INTRAMUSCULAR | Status: DC | PRN
  Administered 2019-01-15: 1 mg via INTRAVENOUS
  Filled 2019-01-15: qty 5

## 2019-01-15 MED ORDER — ROPIVACAINE HCL 2 MG/ML IJ SOLN
2.0000 mL | Freq: Once | INTRAMUSCULAR | Status: AC
  Administered 2019-01-15: 2 mL via EPIDURAL
  Filled 2019-01-15: qty 10

## 2019-01-15 MED ORDER — IOHEXOL 180 MG/ML  SOLN
10.0000 mL | Freq: Once | INTRAMUSCULAR | Status: AC
  Administered 2019-01-15: 10 mL via EPIDURAL

## 2019-01-15 MED ORDER — SODIUM CHLORIDE 0.9% FLUSH
2.0000 mL | Freq: Once | INTRAVENOUS | Status: AC
  Administered 2019-01-15: 2 mL

## 2019-01-15 MED ORDER — LACTATED RINGERS IV SOLN
1000.0000 mL | Freq: Once | INTRAVENOUS | Status: AC
  Administered 2019-01-15: 1000 mL via INTRAVENOUS

## 2019-01-15 NOTE — Progress Notes (Signed)
Patient's Name: Joseph Graham  MRN: [id number]  Referring Provider: [name], MD  DOB: 1940-09-12  PCP: [name], MD  DOS: 01/15/2019  Note by: [name], MD  Service setting: Ambulatory outpatient  Specialty: Interventional Pain Management  Patient type: Established  Location: [facility] (AMB) Pain Management Facility  Visit type: Interventional Procedure   Primary Reason for Visit: Interventional Pain Management Treatment. CC: Back Pain  Procedure:          Anesthesia, Analgesia, Anxiolysis:  Type: Diagnostic Epidural Steroid Injection #2  Region: Caudal Level: Sacrococcygeal   Laterality: Midline       Type: Moderate (Conscious) Sedation combined with Local Anesthesia Indication(s): Analgesia and Anxiety Route: Intravenous (IV) IV Access: Secured Sedation: Meaningful verbal contact was maintained at all times during the procedure  Local Anesthetic: Lidocaine 1-2%  Position: Prone   Indications: 1. Coccygodynia   2. DDD (degenerative disc disease), lumbosacral   3. Chronic coccyx pain (Secondary Area of Pain) (Midline)   4. Other specified spondylopathies, sacral and sacrococcygeal region [facility])    Pain Score: Pre-procedure: 7 /10 Post-procedure: 0-No pain/10   Pre-op Assessment:  Joseph Graham is a 78 y.o. (year old), male patient, seen today for interventional treatment. He  has a past surgical history that includes Hemorroidectomy (1982); A flutter ablation (03/2010); Cataract extraction; carotid US (03/2010); Colonoscopy (09/2011); Tonsillectomy (1964); Electormagnetic navigation bronchoscopy (N/A, 03/02/2015); Transurethral resection of bladder tumor (N/A, 04/06/2015); Cystoscopy w/ retrogrades (Bilateral, 04/06/2015); urinary stent removal (04/14/15); Transurethral resection of bladder tumor (N/A, 07/13/2015); Portacath placement (N/A, 08/10/2015); Port-a-cath removal (Right, 04/03/2016); and Polypectomy. Joseph Graham has a current medication list which includes the  following prescription(s): acetaminophen, albuterol, vitamin d-3, gabapentin, hydrocodone-acetaminophen, lisinopril, metoprolol tartrate, multivitamin with minerals, NON FORMULARY, omeprazole, polyethylene glycol, primidone, vitamin b-12, and vitamin e, and the following Facility-Administered Medications: fentanyl and midazolam. His primarily concern today is the Back Pain  Initial Vital Signs:  Pulse/HCG Rate: 63ECG Heart Rate: 62 Temp: 98.3 F (36.8 C) Resp: 15 BP: (!) 136/54 SpO2: 96 %(2)  BMI: Estimated body mass index is 28.5 kg/m as calculated from the following:   Height as of this encounter: 5\' 9"  (1.753 m).   Weight as of this encounter: 193 lb (87.5 kg).  Risk Assessment: Allergies: Reviewed. He has No Known Allergies.  Allergy Precautions: None required Coagulopathies: Reviewed. None identified.  Blood-thinner therapy: None at this time Active Infection(s): Reviewed. None identified. Joseph Graham is afebrile  Site Confirmation: Joseph Graham was asked to confirm the procedure and laterality before marking the site Procedure checklist: Completed Consent: Before the procedure and under the influence of no sedative(s), amnesic(s), or anxiolytics, the patient was informed of the treatment options, risks and possible complications. To fulfill our ethical and legal obligations, as recommended by the American Medical Association's Code of Ethics, I have informed the patient of my clinical impression; the nature and purpose of the treatment or procedure; the risks, benefits, and possible complications of the intervention; the alternatives, including doing nothing; the risk(s) and benefit(s) of the alternative treatment(s) or procedure(s); and the risk(s) and benefit(s) of doing nothing. The patient was provided information about the general risks and possible complications associated with the procedure. These may include, but are not limited to: failure to achieve desired goals, infection,  bleeding, organ or nerve damage, allergic reactions, paralysis, and death. In addition, the patient was informed of those risks and complications associated to Spine-related procedures, such as failure to decrease pain; infection (i.e.: Meningitis, epidural or  intraspinal abscess); bleeding (i.e.: epidural hematoma, subarachnoid hemorrhage, or any other type of intraspinal or peri-dural bleeding); organ or nerve damage (i.e.: Any type of peripheral nerve, nerve root, or spinal cord injury) with subsequent damage to sensory, motor, and/or autonomic systems, resulting in permanent pain, numbness, and/or weakness of one or several areas of the body; allergic reactions; (i.e.: anaphylactic reaction); and/or death. Furthermore, the patient was informed of those risks and complications associated with the medications. These include, but are not limited to: allergic reactions (i.e.: anaphylactic or anaphylactoid reaction(s)); adrenal axis suppression; blood sugar elevation that in diabetics may result in ketoacidosis or comma; water retention that in patients with history of congestive heart failure may result in shortness of breath, pulmonary edema, and decompensation with resultant heart failure; weight gain; swelling or edema; medication-induced neural toxicity; particulate matter embolism and blood vessel occlusion with resultant organ, and/or nervous system infarction; and/or aseptic necrosis of one or more joints. Finally, the patient was informed that Medicine is not an exact science; therefore, there is also the possibility of unforeseen or unpredictable risks and/or possible complications that may result in a catastrophic outcome. The patient indicated having understood very clearly. We have given the patient no guarantees and we have made no promises. Enough time was given to the patient to ask questions, all of which were answered to the patient's satisfaction. Joseph Graham has indicated that he wanted to  continue with the procedure. Attestation: I, the ordering provider, attest that I have discussed with the patient the benefits, risks, side-effects, alternatives, likelihood of achieving goals, and potential problems during recovery for the procedure that I have provided informed consent. Date  Time: 01/15/2019  9:40 AM  Pre-Procedure Preparation:  Monitoring: As per clinic protocol. Respiration, ETCO2, SpO2, BP, heart rate and rhythm monitor placed and checked for adequate function Safety Precautions: Patient was assessed for positional comfort and pressure points before starting the procedure. Time-out: I initiated and conducted the "Time-out" before starting the procedure, as per protocol. The patient was asked to participate by confirming the accuracy of the "Time Out" information. Verification of the correct person, site, and procedure were performed and confirmed by me, the nursing staff, and the patient. "Time-out" conducted as per Joint Commission's Universal Protocol (UP.01.01.01). Time: 1017  Description of Procedure:          Target Area: Caudal Epidural Canal. Approach: Midline approach. Area Prepped: Entire Posterior Sacrococcygeal Region Prepping solution: DuraPrep (Iodine Povacrylex [0.7% available iodine] and Isopropyl Alcohol, 74% w/w) Safety Precautions: Aspiration looking for blood return was conducted prior to all injections. At no point did we inject any substances, as a needle was being advanced. No attempts were made at seeking any paresthesias. Safe injection practices and needle disposal techniques used. Medications properly checked for expiration dates. SDV (single dose vial) medications used. Description of the Procedure: Protocol guidelines were followed. The patient was placed in position over the fluoroscopy table. The target area was identified and the area prepped in the usual manner. Skin & deeper tissues infiltrated with local anesthetic. Appropriate amount of time  allowed to pass for local anesthetics to take effect. The procedure needles were then advanced to the target area. Proper needle placement secured. Negative aspiration confirmed. Solution injected in intermittent fashion, asking for systemic symptoms every 0.5cc of injectate. The needles were then removed and the area cleansed, making sure to leave some of the prepping solution back to take advantage of its long term bactericidal properties. Vitals:   01/15/19 1024  01/15/19 1034 01/15/19 1044 01/15/19 1054  BP: (!) 120/50 (!) 126/52 (!) 126/52 123/60  Pulse:      Resp: 12 12 16 15   Temp:  97.7 F (36.5 C)  (!) 97.5 F (36.4 C)  SpO2: 97% 95% 95% 96%  Weight:      Height:        Start Time: 1017 hrs. End Time: 1023 hrs. Materials:  Needle(s) Type: Epidural needle Gauge: 17G Length: 3.5-in Medication(s): Please see orders for medications and dosing details.  Imaging Guidance (Spinal):          Type of Imaging Technique: Fluoroscopy Guidance (Spinal) Indication(s): Assistance in needle guidance and placement for procedures requiring needle placement in or near specific anatomical locations not easily accessible without such assistance. Exposure Time: Please see nurses notes. Contrast: Before injecting any contrast, we confirmed that the patient did not have an allergy to iodine, shellfish, or radiological contrast. Once satisfactory needle placement was completed at the desired level, radiological contrast was injected. Contrast injected under live fluoroscopy. No contrast complications. See chart for type and volume of contrast used. Fluoroscopic Guidance: I was personally present during the use of fluoroscopy. "Tunnel Vision Technique" used to obtain the best possible view of the target area. Parallax error corrected before commencing the procedure. "Direction-depth-direction" technique used to introduce the needle under continuous pulsed fluoroscopy. Once target was reached,  antero-posterior, oblique, and lateral fluoroscopic projection used confirm needle placement in all planes. Images permanently stored in EMR. Interpretation: I personally interpreted the imaging intraoperatively. Adequate needle placement confirmed in multiple planes. Appropriate spread of contrast into desired area was observed. No evidence of afferent or efferent intravascular uptake. No intrathecal or subarachnoid spread observed. Permanent images saved into the patient's record.  Antibiotic Prophylaxis:   Anti-infectives (From admission, onward)   None     Indication(s): None identified  Post-operative Assessment:  Post-procedure Vital Signs:  Pulse/HCG Rate: 6363 Temp: (!) 97.5 F (36.4 C) Resp: 15 BP: 123/60 SpO2: 96 %  EBL: None  Complications: No immediate post-treatment complications observed by team, or reported by patient.  Note: The patient tolerated the entire procedure well. A repeat set of vitals were taken after the procedure and the patient was kept under observation following institutional policy, for this type of procedure. Post-procedural neurological assessment was performed, showing return to baseline, prior to discharge. The patient was provided with post-procedure discharge instructions, including a section on how to identify potential problems. Should any problems arise concerning this procedure, the patient was given instructions to immediately contact us, at any time, without hesitation. In any case, we plan to contact the patient by telephone for a follow-up status report regarding this interventional procedure.  Comments:  No additional relevant information.  Plan of Care  Orders:  Orders Placed This Encounter  Procedures  . Caudal Epidural Injection    Scheduling Instructions:     Laterality: Midline     Level(s): Sacrococcygeal canal (Tailbone area)     Sedation: Patient's choice     Timeframe: Today    Order Specific Question:   Where will this  procedure be performed?    Answer:   [facility]  . DG PAIN CLINIC C-ARM 1-60 MIN NO REPORT    Intraoperative interpretation by procedural physician at [facility].    Standing Status:   Standing    Number of Occurrences:   1    Order Specific Question:   Reason for exam:    Answer:  Assistance in needle guidance and placement for procedures requiring needle placement in or near specific anatomical locations not easily accessible without such assistance.  . Provider attestation of informed consent for procedure/surgical case    I, the ordering provider, attest that I have discussed with the patient the benefits, risks, side effects, alternatives, likelihood of achieving goals and potential problems during recovery for the procedure that I have provided informed consent.    Standing Status:   Standing    Number of Occurrences:   1  . Informed Consent Details: Transcribe to consent form and obtain patient signature    Standing Status:   Standing    Number of Occurrences:   1    Order Specific Question:   Procedure    Answer:   Caudal epidural steroid injection under fluoroscopic guidance.    Order Specific Question:   Surgeon    Answer:   Kathlen Brunswick. Dossie Arbour, MD    Order Specific Question:   Indication/Reason    Answer:   Low back pain and/or lower extremity pain secondary to lumbosacral radiculitis/radiculopathy   Chronic Opioid Analgesic:  No opioid analgesics prescribed by our practice. Hydrocodone/APAP 5/325 mg, 1 tab PO q8 hrs (15 mg/day of hydrocodone) (Last prescribed on 11/14/2018, by Verlon Au, NP. MME/day:15mg /day.   Medications ordered for procedure: Meds ordered this encounter  Medications  . iohexol (OMNIPAQUE) 180 MG/ML injection 10 mL    Must be Myelogram-compatible. If not available, you may substitute with a water-soluble, non-ionic, hypoallergenic, myelogram-compatible radiological contrast medium.  Marland Kitchen lidocaine (XYLOCAINE) 2 % (with pres)  injection 400 mg  . lactated ringers infusion 1,000 mL  . midazolam (VERSED) 5 MG/5ML injection 1-2 mg    Make sure Flumazenil is available in the pyxis when using this medication. If oversedation occurs, administer 0.2 mg IV over 15 sec. If after 45 sec no response, administer 0.2 mg again over 1 min; may repeat at 1 min intervals; not to exceed 4 doses (1 mg)  . fentaNYL (SUBLIMAZE) injection 25-50 mcg    Make sure Narcan is available in the pyxis when using this medication. In the event of respiratory depression (RR< 8/min): Titrate NARCAN (naloxone) in increments of 0.1 to 0.2 mg IV at 2-3 minute intervals, until desired degree of reversal.  . sodium chloride flush (NS) 0.9 % injection 2 mL  . ropivacaine (PF) 2 mg/mL (0.2%) (NAROPIN) injection 2 mL  . triamcinolone acetonide (KENALOG-40) injection 40 mg   Medications administered: We administered iohexol, lidocaine, lactated ringers, midazolam, fentaNYL, sodium chloride flush, ropivacaine (PF) 2 mg/mL (0.2%), and triamcinolone acetonide.  See the medical record for exact dosing, route, and time of administration.  Follow-up plan:   Return for (VV), 2 wk PP-F/U Eval.       Interventional management options: Considering: Diagnosticleft L5 transverse process and sacrumpseudoarticulationinjection #2  Possible Racz procedure Diagnostic coccygeal injection DiagnosticrightL4-5 LESI Diagnostic right L3-4 LESI Diagnostic right L2 TFESI Diagnostic right L3 TFESI Diagnostic right L4 TFESI DiagnosticbilateralLSB Diagnostic bilateral lumbar facet block #2 Possible bilateral lumbar facetRFA Diagnostic bilateral S-Iblock Possible bilateral S-I jointRFA   Palliative PRN treatment(s): Palliative midline caudal ESIs #2 (100/100/100/100) (helped w/ coccygodynia & LEP) Palliative bilateral lumbar facet blocks #2 (100/100/90/90) (helped w/ LE spasms & LBP)     Recent Visits Date Type Provider Dept  12/31/18 Office  Visit Milinda Pointer, MD [facility]  12/02/18 Procedure visit Milinda Pointer, MD [facility]  Showing recent visits within past 90 days and meeting  all other requirements   Today's Visits Date Type Provider Dept  01/15/19 Procedure visit Milinda Pointer, MD [facility]  Showing today's visits and meeting all other requirements   Future Appointments Date Type Provider Dept  02/12/19 Appointment Milinda Pointer, MD [facility]  Showing future appointments within next 90 days and meeting all other requirements   Disposition: Discharge home  Discharge Date & Time: 01/15/2019; 1055 hrs.   Primary Care Physician: [name], MD Location: [facility] Facility Note by: [name], MD Date: 01/15/2019; Time: 11:00 AM  Disclaimer:  Medicine is not an Chief Strategy Officer. The only guarantee in medicine is that nothing is guaranteed. It is important to note that the decision to proceed with this intervention was based on the information collected from the patient. The Data and conclusions were drawn from the patient's questionnaire, the interview, and the physical examination. Because the information was provided in large part by the patient, it cannot be guaranteed that it has not been purposely or unconsciously manipulated. Every effort has been made to obtain as much relevant data as possible for this evaluation. It is important to note that the conclusions that lead to this procedure are derived in large part from the available data. Always take into account that the treatment will also be dependent on availability of resources and existing treatment guidelines, considered by other Pain Management Practitioners as being common knowledge and practice, at the time of the intervention. For Medico-Legal purposes, it is also important to point out that variation in procedural techniques and pharmacological choices are the  acceptable norm. The indications, contraindications, technique, and results of the above procedure should only be interpreted and judged by a Board-Certified Interventional Pain Specialist with extensive familiarity and expertise in the same exact procedure and technique.

## 2019-01-15 NOTE — Patient Instructions (Signed)

## 2019-01-16 ENCOUNTER — Telehealth: Payer: Self-pay

## 2019-01-16 NOTE — Telephone Encounter (Signed)
Denies any needs at this time. States he feels well this am. Instructed to call if needed.

## 2019-01-19 ENCOUNTER — Other Ambulatory Visit: Payer: Self-pay

## 2019-01-19 ENCOUNTER — Encounter
Admission: RE | Admit: 2019-01-19 | Discharge: 2019-01-19 | Disposition: A | Discharge status: Home / Self Care | Payer: Medicare Other | Source: Ambulatory Visit | Attending: Neurosurgery | Admitting: Neurosurgery

## 2019-01-19 DIAGNOSIS — I452 Bifascicular block: Secondary | ICD-10-CM | POA: Insufficient documentation

## 2019-01-19 DIAGNOSIS — J449 Chronic obstructive pulmonary disease, unspecified: Secondary | ICD-10-CM | POA: Insufficient documentation

## 2019-01-19 DIAGNOSIS — I2510 Atherosclerotic heart disease of native coronary artery without angina pectoris: Secondary | ICD-10-CM | POA: Insufficient documentation

## 2019-01-19 DIAGNOSIS — Z20828 Contact with and (suspected) exposure to other viral communicable diseases: Secondary | ICD-10-CM | POA: Insufficient documentation

## 2019-01-19 DIAGNOSIS — Z79899 Other long term (current) drug therapy: Secondary | ICD-10-CM | POA: Diagnosis not present

## 2019-01-19 DIAGNOSIS — Z01818 Encounter for other preprocedural examination: Secondary | ICD-10-CM | POA: Insufficient documentation

## 2019-01-19 DIAGNOSIS — Q7649 Other congenital malformations of spine, not associated with scoliosis: Secondary | ICD-10-CM | POA: Diagnosis not present

## 2019-01-19 DIAGNOSIS — Z9981 Dependence on supplemental oxygen: Secondary | ICD-10-CM | POA: Diagnosis not present

## 2019-01-19 DIAGNOSIS — R9431 Abnormal electrocardiogram [ECG] [EKG]: Secondary | ICD-10-CM | POA: Insufficient documentation

## 2019-01-19 DIAGNOSIS — I10 Essential (primary) hypertension: Secondary | ICD-10-CM | POA: Insufficient documentation

## 2019-01-19 DIAGNOSIS — Z87891 Personal history of nicotine dependence: Secondary | ICD-10-CM | POA: Diagnosis not present

## 2019-01-19 DIAGNOSIS — K219 Gastro-esophageal reflux disease without esophagitis: Secondary | ICD-10-CM | POA: Diagnosis not present

## 2019-01-19 DIAGNOSIS — R7303 Prediabetes: Secondary | ICD-10-CM | POA: Diagnosis not present

## 2019-01-19 HISTORY — DX: Prediabetes: R73.03

## 2019-01-19 HISTORY — DX: Personal history of urinary calculi: Z87.442

## 2019-01-19 LAB — BASIC METABOLIC PANEL
Anion gap: 9 (ref 5–15)
BUN: 28 mg/dL — ABNORMAL HIGH (ref 8–23)
CO2: 32 mmol/L (ref 22–32)
Calcium: 9.1 mg/dL (ref 8.9–10.3)
Chloride: 101 mmol/L (ref 98–111)
Creatinine, Ser: 0.58 mg/dL — ABNORMAL LOW (ref 0.61–1.24)
GFR calc Af Amer: >60 mL/min (ref >60)
GFR calc non Af Amer: >60 mL/min (ref >60)
Glucose, Bld: 107 mg/dL — ABNORMAL HIGH (ref 70–99)
Potassium: 4 mmol/L (ref 3.5–5.1)
Sodium: 142 mmol/L (ref 135–145)

## 2019-01-19 LAB — TYPE AND SCREEN
ABO/RH(D): AB POS
Antibody Screen: NEGATIVE

## 2019-01-19 LAB — CBC
HCT: 42.9 % (ref 39.0–52.0)
Hemoglobin: 13.4 g/dL (ref 13.0–17.0)
MCH: 32.7 pg (ref 26.0–34.0)
MCHC: 31.2 g/dL (ref 30.0–36.0)
MCV: 104.6 fL — ABNORMAL HIGH (ref 80.0–100.0)
Platelets: 272 10*3/uL (ref 150–400)
RBC: 4.1 MIL/uL — ABNORMAL LOW (ref 4.22–5.81)
RDW: 12.7 % (ref 11.5–15.5)
WBC: 7.5 10*3/uL (ref 4.0–10.5)
nRBC: 0 % (ref 0.0–0.2)

## 2019-01-19 LAB — PROTIME-INR
INR: 1.1 (ref 0.8–1.2)
Prothrombin Time: 13.8 seconds (ref 11.4–15.2)

## 2019-01-19 LAB — APTT: aPTT: 34 seconds (ref 24–36)

## 2019-01-19 NOTE — Patient Instructions (Signed)
Your procedure is scheduled on: 01-26-19 MONDAY Report to Same Day Surgery 2nd floor medical mall [facility] Entrance-take elevator on left to 2nd floor.  Check in with surgery information desk.) To find out your arrival time please call [phone] between 1PM - 3PM on 01-23-19 FRIDAY  Remember: Instructions that are not followed completely may result in serious medical risk, up to and including death, or upon the discretion of your surgeon and anesthesiologist your surgery may need to be rescheduled.    _x___ 1. Do not eat food after midnight the night before your procedure. NO GUM OR CANDY AFTER MIDNIGHT. You may drink clear liquids up to 2 hours before you are scheduled to arrive at the hospital for your procedure.  Do not drink clear liquids within 2 hours of your scheduled arrival to the hospital.  Clear liquids include  --Water or Apple juice without pulp  --Clear carbohydrate beverage such as ClearFast or Gatorade  --Black Coffee or Clear Tea (No milk, no creamers, do not add anything to the coffee or Tea   ____Ensure clear carbohydrate drink on the way to the hospital for bariatric patients  ____Ensure clear carbohydrate drink 3 hours before surgery.     __x__ 2. No Alcohol for 24 hours before or after surgery.   __x__3. No Smoking or e-cigarettes for 24 prior to surgery.  Do not use any chewable tobacco products for at least 6 hour prior to surgery   ____  4. Bring all medications with you on the day of surgery if instructed.    __x__ 5. Notify your doctor if there is any change in your medical condition     (cold, fever, infections).    x___6. On the morning of surgery brush your teeth with toothpaste and water.  You may rinse your mouth with mouth wash if you wish.  Do not swallow any toothpaste or mouthwash.   Do not wear jewelry, make-up, hairpins, clips or nail polish.  Do not wear lotions, powders, or perfumes. You may wear deodorant.  Do not shave 48 hours prior  to surgery. Men may shave face and neck.  Do not bring valuables to the hospital.    [facility] is not responsible for any belongings or valuables.               Contacts, dentures or bridgework may not be worn into surgery.  Leave your suitcase in the car. After surgery it may be brought to your room.  For patients admitted to the hospital, discharge time is determined by your treatment team.  _  Patients discharged the day of surgery will not be allowed to drive home.  You will need someone to drive you home and stay with you the night of your procedure.    Please read over the following fact sheets that you were given:   [facility] Preparing for Surgery   _x___ TAKE THE FOLLOWING MEDICATION THE MORNING OF SURGERY WITH A SMALL SIP OF WATER. These include:  1. GABAPENTIN (NEURONTIN)  2. HYDROCODONE   3. METOPROLOL (TOPROL)  4. PRIMIDONE (MYSOLINE)  5. OMEPRAZOLE MAGNESIUM (PRILOSEC)  6. TAKE AN EXTRA OMEPRAZOLE THE NIGHT BEFORE YOUR SURGERY  ____Fleets enema or Magnesium Citrate as directed.   _x___ Use CHG Soap or sage wipes as directed on instruction sheet  _X___ Use inhalers on the day of surgery and bring to hospital day of surgery-USE YOUR ALBUTEROL INHALER DAY OF SURGERY AND [place]  ____ Stop Metformin  and Janumet 2 days prior to surgery.    ____ Take 1/2 of usual insulin dose the night before surgery and none on the morning     surgery.   ____ Follow recommendations from Cardiologist, Pulmonologist or PCP regarding stopping Aspirin, Coumadin, Plavix ,Eliquis, Effient, or Pradaxa, and Pletal.  X____Stop Anti-inflammatories such as Advil, Aleve, Ibuprofen, Motrin, Naproxen, Naprosyn, Goodies powders or aspirin products NOW-OK to take Tylenol    _x___ Stop supplements until after surgery-STOP VITAMIN E NOW-MAY RESUME AFTER SURGERY   ____ Bring C-Pap to the hospital.

## 2019-01-20 ENCOUNTER — Telehealth: Payer: Self-pay | Admitting: Family Medicine

## 2019-01-20 DIAGNOSIS — I452 Bifascicular block: Secondary | ICD-10-CM

## 2019-01-20 DIAGNOSIS — R9431 Abnormal electrocardiogram [ECG] [EKG]: Secondary | ICD-10-CM

## 2019-01-20 NOTE — Pre-Procedure Instructions (Signed)
Called dr Kayleen Memos on 01-19-19 for abnormal Ekg-Dr carroll wants medical clearance

## 2019-01-20 NOTE — Pre-Procedure Instructions (Signed)
Called Genevie Cheshire at Dr cooks office and notified her of this. Also faxed this to Dr Geralynn Ochs office and to his PCP office, Dr Ella Bodo

## 2019-01-20 NOTE — Telephone Encounter (Signed)
Just got this message/clearance form this afternoon after 5pm.  Surgery is scheduled for 8/17. Will likely need to be postponed.  Reviewed EKG - possible new bifascicular block (conduction block down both sides of heart).  Recommend cardiology evaluation prior to surgery. Referral placed. Spoke with patient.

## 2019-01-20 NOTE — Telephone Encounter (Signed)
Patient went for his pre-surgical clearance at [facility] and they did an EKG.  Herminie called patient and said it was abnormal and for patient to call Dr.G to get approval for his back surgery by Dr.Cook. Patient said he's had a leaky valve that he's been aware of for several years. Please call patient back.

## 2019-01-21 ENCOUNTER — Telehealth: Payer: Self-pay | Admitting: Cardiovascular Disease

## 2019-01-21 DIAGNOSIS — I452 Bifascicular block: Secondary | ICD-10-CM | POA: Insufficient documentation

## 2019-01-21 DIAGNOSIS — T451X5A Adverse effect of antineoplastic and immunosuppressive drugs, initial encounter: Secondary | ICD-10-CM | POA: Diagnosis not present

## 2019-01-21 DIAGNOSIS — Z01810 Encounter for preprocedural cardiovascular examination: Secondary | ICD-10-CM | POA: Insufficient documentation

## 2019-01-21 DIAGNOSIS — G620 Drug-induced polyneuropathy: Secondary | ICD-10-CM | POA: Diagnosis not present

## 2019-01-21 NOTE — Telephone Encounter (Signed)
Called [facility] office the soonest Appt was August 31st for a Virtual visit with Dr Rockey Situ. Patient was notified and will contact his surgeon.Previously called to followup with Dr Rockey Situ in 2018 but no appointment was scheduled by patient.

## 2019-01-22 ENCOUNTER — Other Ambulatory Visit
Admission: RE | Admit: 2019-01-22 | Discharge: 2019-01-22 | Disposition: A | Discharge status: Home / Self Care | Payer: Medicare Other | Source: Ambulatory Visit | Attending: Neurosurgery | Admitting: Neurosurgery

## 2019-01-22 ENCOUNTER — Ambulatory Visit
Admission: RE | Admit: 2019-01-22 | Discharge: 2019-01-22 | Disposition: A | Discharge status: Home / Self Care | Payer: Medicare Other | Source: Ambulatory Visit | Attending: Neurosurgery | Admitting: Neurosurgery

## 2019-01-22 ENCOUNTER — Other Ambulatory Visit: Payer: Self-pay

## 2019-01-22 DIAGNOSIS — Z9981 Dependence on supplemental oxygen: Secondary | ICD-10-CM | POA: Diagnosis not present

## 2019-01-22 DIAGNOSIS — Z79899 Other long term (current) drug therapy: Secondary | ICD-10-CM | POA: Diagnosis not present

## 2019-01-22 DIAGNOSIS — Z87891 Personal history of nicotine dependence: Secondary | ICD-10-CM | POA: Insufficient documentation

## 2019-01-22 DIAGNOSIS — R7303 Prediabetes: Secondary | ICD-10-CM | POA: Diagnosis not present

## 2019-01-22 DIAGNOSIS — Z01818 Encounter for other preprocedural examination: Secondary | ICD-10-CM | POA: Insufficient documentation

## 2019-01-22 DIAGNOSIS — I2510 Atherosclerotic heart disease of native coronary artery without angina pectoris: Secondary | ICD-10-CM | POA: Diagnosis not present

## 2019-01-22 DIAGNOSIS — M48061 Spinal stenosis, lumbar region without neurogenic claudication: Secondary | ICD-10-CM | POA: Diagnosis not present

## 2019-01-22 DIAGNOSIS — Z20828 Contact with and (suspected) exposure to other viral communicable diseases: Secondary | ICD-10-CM | POA: Insufficient documentation

## 2019-01-22 DIAGNOSIS — Q7649 Other congenital malformations of spine, not associated with scoliosis: Secondary | ICD-10-CM | POA: Insufficient documentation

## 2019-01-22 DIAGNOSIS — M5127 Other intervertebral disc displacement, lumbosacral region: Secondary | ICD-10-CM | POA: Diagnosis not present

## 2019-01-22 DIAGNOSIS — J449 Chronic obstructive pulmonary disease, unspecified: Secondary | ICD-10-CM | POA: Diagnosis not present

## 2019-01-22 DIAGNOSIS — K219 Gastro-esophageal reflux disease without esophagitis: Secondary | ICD-10-CM | POA: Diagnosis not present

## 2019-01-22 DIAGNOSIS — M47816 Spondylosis without myelopathy or radiculopathy, lumbar region: Secondary | ICD-10-CM | POA: Diagnosis not present

## 2019-01-22 DIAGNOSIS — I10 Essential (primary) hypertension: Secondary | ICD-10-CM | POA: Diagnosis not present

## 2019-01-22 LAB — URINALYSIS, ROUTINE W REFLEX MICROSCOPIC
Bilirubin Urine: NEGATIVE
Glucose, UA: NEGATIVE mg/dL
Hgb urine dipstick: NEGATIVE
Ketones, ur: NEGATIVE mg/dL
Leukocytes,Ua: NEGATIVE
Nitrite: NEGATIVE
Protein, ur: NEGATIVE mg/dL
Specific Gravity, Urine: 1.017 (ref 1.005–1.030)
pH: 6 (ref 5.0–8.0)

## 2019-01-22 NOTE — Pre-Procedure Instructions (Signed)
Joseph Cowman, MD - 01/21/2019 2:00 PM EDT Formatting of this note might be different from the original. New Patient Visit   Chief Complaint: Chief Complaint  Patient presents with  . Establish Care  . Pre Op Consulting  abn ecg  . Shortness of Breath  copd  . Fatigue  now  . Edema  little  Date of Service: 01/21/2019 Date of Birth: [birth year] PCP: [name], MD  History of Present Illness: Mr. Swett is a 78 y.o.male patient who is referred for preoperative cardiovascular evaluation prior to L5 left transverse process resection for pseudoarthrosis.. ECG revealed sinus rhythm with right bundle branch block and possible old septal MI. Patient denies chest pain. He has chronic exertional dyspnea due to underlying COPD. The patient quit smoking 4 years ago at which time he underwent chemotherapy and radiation therapy for lung cancer. He denies palpitations or heart racing. He has chronic pedal edema due to peripheral neuropathy. The patient is not active, does not exercise due to low back pain and chronic peripheral neuropathy. Patient denies clinical history of myocardial infarction, congestive heart failure, stroke, diabetes or chronic kidney disease.  Past Medical and Surgical History  Past Medical History Past Medical History:  Diagnosis Date  . Arthritis Feb 2006  . Cancer (CMS-HCC)  . COPD (chronic obstructive pulmonary disease) (CMS-HCC)  . GERD (gastroesophageal reflux disease)  . Hypertension  . Lung cancer (CMS-HCC)  . Neuropathy  . Right bundle branch block (RBBB) with left anterior hemiblock 01/21/2019  . Tremor   Past Surgical History He has a past surgical history that includes Cystotomy W/Excision Bladder Tumor and chemotherapy port removal.   Medications and Allergies  Current Medications Current Outpatient Medications  Medication Sig Dispense Refill  . acetaminophen (TYLENOL) 325 mg Cap Take 325 mg by mouth as needed  . cannabidiol, CBD,  (CANNABIDIOL ORAL) Take 500 mg by mouth once daily  . cholecalciferol (VITAMIN D3) 5,000 unit capsule Take 5,000 Units by mouth once daily  . cyanocobalamin (VITAMIN B12) 1000 MCG tablet Take 1,000 mcg by mouth once daily  . gabapentin (NEURONTIN) 300 MG capsule TAKE 1 CAPSULE IN THE MORNING AND 1 CAPSULE IN AFTERNOON, AND 2 CAPSULES AT NIGHT. 360 capsule 1  . HYDROcodone-acetaminophen (NORCO) 5-325 mg tablet Take 1 tablet by mouth every 8 (eight) hours as needed for Pain  . lisinopril (PRINIVIL,ZESTRIL) 10 MG tablet Take 10 mg by mouth once daily.  . magnesium oxide 500 mg Cap Take 500 mg by mouth once daily  . metoprolol tartrate (LOPRESSOR) 25 MG tablet Take 12.5 mg by mouth 2 (two) times daily  . multivitamin tablet Take 1 tablet by mouth once daily.  Marland Kitchen omeprazole (PRILOSEC) 20 MG DR capsule Take 20 mg by mouth once daily  . polyethylene glycol (MIRALAX) powder Take 17 g by mouth once daily Mix in 4-8ounces of fluid prior to taking.  . primidone (MYSOLINE) 50 MG tablet TAKE 3 TABLETS BY MOUTH TWICE DAILY 540 tablet 0  . vitamin E 400 UNIT capsule Take 400 Units by mouth once daily   No current facility-administered medications for this visit.   Allergies Patient has no known allergies.  Social and Family History  Social History reports that he quit smoking about 2 years ago. His smoking use included cigarettes. He started smoking about 63 years ago. He has a 120.00 pack-year smoking history. He has never used smokeless tobacco. He reports previous alcohol use. He reports that he does not use drugs.  Family History family  history includes Cancer in his brother, brother, and mother; Dementia in his brother, brother, and father; High blood pressure (Hypertension) in his brother, brother, and father; Hyperlipidemia (Elevated cholesterol) in his father; Stroke in his brother; Tremor in his brother and father.   Review of Systems   Review of Systems: The patient denies chest pain, reports  chronic exertional shortness of breath, without orthopnea, paroxysmal nocturnal dyspnea, with pedal edema, without palpitations, heart racing, presyncope, syncope. Review of 12 Systems is negative except as described above.  Physical Examination   Vitals:BP 142/62  Pulse 66  Ht 175.3 cm (5\' 9" )  Wt 82.6 kg (182 lb)  SpO2 98%  BMI 26.88 kg/m  Ht:175.3 cm (5\' 9" ) Wt:82.6 kg (182 lb) ZWC:HENI surface area is 2.01 meters squared. Body mass index is 26.88 kg/m.  HEENT: Pupils equally reactive to light and accomodation  Neck: Supple without thyromegaly, carotid pulses 2+ Lungs: clear to auscultation bilaterally; no wheezes, rales, rhonchi Heart: Regular rate and rhythm. No gallops, murmurs or rub Abdomen: soft nontender, nondistended, with normal bowel sounds Extremities: no cyanosis, clubbing, or edema Peripheral Pulses: 2+ in all extremities, 2+ femoral pulses bilaterally  Assessment   78 y.o. male with  1. Peripheral neuropathy due to chemotherapy (CMS-HCC)  2. Right bundle branch block (RBBB) with left anterior hemiblock  3. Preop cardiovascular exam   78 year old gentleman scheduled for L5 left transverse process resection, with preoperative ECG revealing right bundle branch block with left anterior hemiblock, with possible old septal MI. Patient denies chest pain. He has chronic exertional dyspnea due to underlying COPD and history of lung cancer status post chemotherapy and radiation therapy.  Plan   1. Continue current medications 2. [place] 3. Return to clinic after [place] This Encounter  Procedures  . NM myocardial perfusion SPECT multiple (stress and rest)  . ECG stress test only   Return in about 1 day (around 01/22/2019), or after Lexi-Myo.  Joseph Cowman, MD PhD [facility]    Electronically signed by Joseph Cowman, MD at 01/21/2019 2:26 PM EDT

## 2019-01-23 ENCOUNTER — Other Ambulatory Visit: Payer: Medicare Other

## 2019-01-23 ENCOUNTER — Encounter: Payer: Self-pay | Admitting: Family Medicine

## 2019-01-23 DIAGNOSIS — I452 Bifascicular block: Secondary | ICD-10-CM | POA: Diagnosis not present

## 2019-01-23 DIAGNOSIS — Z01810 Encounter for preprocedural cardiovascular examination: Secondary | ICD-10-CM | POA: Diagnosis not present

## 2019-01-23 LAB — SARS CORONAVIRUS 2 (TAT 6-24 HRS): SARS Coronavirus 2: NEGATIVE

## 2019-01-23 NOTE — Telephone Encounter (Signed)
Pt left v/m pt just passed stress test for heart today. Pt request cardiology appt cancelled; Meriam Sprague has already cancelled card appt. Someone pt knew at [facility] pulled some strings and got pt scheduled today. Pt said surgery is a go for 01/26/19. Pt wanted Dr Darnell Level to be aware.

## 2019-01-23 NOTE — Pre-Procedure Instructions (Signed)
CARDIAC CLEARANCE ON CHART

## 2019-01-23 NOTE — Telephone Encounter (Signed)
Great. Thank you.

## 2019-01-23 NOTE — Pre-Procedure Instructions (Signed)
Called over to dr Saralyn Pilar office regarding status of clearance. Pt is scheduled for stress test this am at 9

## 2019-01-26 ENCOUNTER — Other Ambulatory Visit: Payer: Self-pay

## 2019-01-26 ENCOUNTER — Encounter: Payer: Self-pay | Admitting: *Deleted

## 2019-01-26 ENCOUNTER — Ambulatory Visit: Payer: Medicare Other | Admitting: Anesthesiology

## 2019-01-26 ENCOUNTER — Ambulatory Visit: Payer: Medicare Other

## 2019-01-26 ENCOUNTER — Ambulatory Visit
Admission: RE | Admit: 2019-01-26 | Discharge: 2019-01-26 | Disposition: A | Discharge status: Home / Self Care | Payer: Medicare Other | Source: Ambulatory Visit | Attending: Neurosurgery | Admitting: Neurosurgery

## 2019-01-26 ENCOUNTER — Encounter
Admission: RE | Disposition: A | Discharge status: Home / Self Care | Payer: Self-pay | Source: Ambulatory Visit | Attending: Neurosurgery

## 2019-01-26 DIAGNOSIS — Z8551 Personal history of malignant neoplasm of bladder: Secondary | ICD-10-CM | POA: Diagnosis not present

## 2019-01-26 DIAGNOSIS — Q7649 Other congenital malformations of spine, not associated with scoliosis: Secondary | ICD-10-CM | POA: Insufficient documentation

## 2019-01-26 DIAGNOSIS — J449 Chronic obstructive pulmonary disease, unspecified: Secondary | ICD-10-CM | POA: Diagnosis not present

## 2019-01-26 DIAGNOSIS — Z85118 Personal history of other malignant neoplasm of bronchus and lung: Secondary | ICD-10-CM | POA: Insufficient documentation

## 2019-01-26 DIAGNOSIS — I739 Peripheral vascular disease, unspecified: Secondary | ICD-10-CM | POA: Diagnosis not present

## 2019-01-26 DIAGNOSIS — Z87891 Personal history of nicotine dependence: Secondary | ICD-10-CM | POA: Diagnosis not present

## 2019-01-26 DIAGNOSIS — G620 Drug-induced polyneuropathy: Secondary | ICD-10-CM

## 2019-01-26 DIAGNOSIS — I10 Essential (primary) hypertension: Secondary | ICD-10-CM | POA: Diagnosis not present

## 2019-01-26 DIAGNOSIS — I2510 Atherosclerotic heart disease of native coronary artery without angina pectoris: Secondary | ICD-10-CM | POA: Insufficient documentation

## 2019-01-26 DIAGNOSIS — I4891 Unspecified atrial fibrillation: Secondary | ICD-10-CM | POA: Insufficient documentation

## 2019-01-26 DIAGNOSIS — C3411 Malignant neoplasm of upper lobe, right bronchus or lung: Secondary | ICD-10-CM

## 2019-01-26 DIAGNOSIS — K219 Gastro-esophageal reflux disease without esophagitis: Secondary | ICD-10-CM | POA: Insufficient documentation

## 2019-01-26 DIAGNOSIS — E785 Hyperlipidemia, unspecified: Secondary | ICD-10-CM | POA: Diagnosis not present

## 2019-01-26 DIAGNOSIS — E119 Type 2 diabetes mellitus without complications: Secondary | ICD-10-CM | POA: Insufficient documentation

## 2019-01-26 DIAGNOSIS — C3491 Malignant neoplasm of unspecified part of right bronchus or lung: Secondary | ICD-10-CM

## 2019-01-26 DIAGNOSIS — Z981 Arthrodesis status: Secondary | ICD-10-CM | POA: Diagnosis not present

## 2019-01-26 DIAGNOSIS — Z9981 Dependence on supplemental oxygen: Secondary | ICD-10-CM | POA: Insufficient documentation

## 2019-01-26 DIAGNOSIS — Z419 Encounter for procedure for purposes other than remedying health state, unspecified: Secondary | ICD-10-CM

## 2019-01-26 DIAGNOSIS — T451X5A Adverse effect of antineoplastic and immunosuppressive drugs, initial encounter: Secondary | ICD-10-CM

## 2019-01-26 HISTORY — PX: HEMI-MICRODISCECTOMY LUMBAR LAMINECTOMY LEVEL 1: SHX5846

## 2019-01-26 LAB — ABO/RH: ABO/RH(D): AB POS

## 2019-01-26 SURGERY — HEMI-MICRODISCECTOMY LUMBAR LAMINECTOMY LEVEL 1
Anesthesia: General

## 2019-01-26 MED ORDER — ONDANSETRON HCL 4 MG/2ML IJ SOLN: 4.0000 mg | Freq: Once | INTRAMUSCULAR | Status: DC | PRN

## 2019-01-26 MED ORDER — IPRATROPIUM-ALBUTEROL 0.5-2.5 (3) MG/3ML IN SOLN
3.0000 mL | Freq: Once | RESPIRATORY_TRACT | Status: AC
  Administered 2019-01-26: 3 mL via RESPIRATORY_TRACT

## 2019-01-26 MED ORDER — TIZANIDINE HCL 2 MG PO TABS: 2.0000 mg | ORAL_TABLET | Freq: Four times a day (QID) | ORAL | 0 refills | Status: DC | PRN

## 2019-01-26 MED ORDER — CEFAZOLIN SODIUM-DEXTROSE 2-4 GM/100ML-% IV SOLN
2.0000 g | INTRAVENOUS | Status: AC
  Administered 2019-01-26: 2 g via INTRAVENOUS

## 2019-01-26 MED ORDER — SUCCINYLCHOLINE CHLORIDE 20 MG/ML IJ SOLN
INTRAMUSCULAR | Status: DC | PRN
  Administered 2019-01-26: 120 mg via INTRAVENOUS

## 2019-01-26 MED ORDER — THROMBIN 5000 UNITS EX SOLR
CUTANEOUS | Status: AC
  Filled 2019-01-26: qty 5000

## 2019-01-26 MED ORDER — METHYLPREDNISOLONE SODIUM SUCC 125 MG IJ SOLR
INTRAMUSCULAR | Status: DC | PRN
  Administered 2019-01-26: 125 mg via INTRAVENOUS

## 2019-01-26 MED ORDER — METHYLPREDNISOLONE ACETATE 40 MG/ML IJ SUSP
INTRAMUSCULAR | Status: DC | PRN
  Administered 2019-01-26: 40 mg

## 2019-01-26 MED ORDER — EPHEDRINE SULFATE 50 MG/ML IJ SOLN
INTRAMUSCULAR | Status: AC
  Filled 2019-01-26: qty 1

## 2019-01-26 MED ORDER — MIDAZOLAM HCL 2 MG/2ML IJ SOLN
INTRAMUSCULAR | Status: AC
  Filled 2019-01-26: qty 2

## 2019-01-26 MED ORDER — EPHEDRINE SULFATE 50 MG/ML IJ SOLN
INTRAMUSCULAR | Status: DC | PRN
  Administered 2019-01-26 (×2): 25 mg via INTRAVENOUS

## 2019-01-26 MED ORDER — FENTANYL CITRATE (PF) 250 MCG/5ML IJ SOLN
INTRAMUSCULAR | Status: DC | PRN
  Administered 2019-01-26: 25 ug via INTRAVENOUS
  Administered 2019-01-26: 50 ug via INTRAVENOUS
  Administered 2019-01-26: 25 ug via INTRAVENOUS
  Administered 2019-01-26: 50 ug via INTRAVENOUS

## 2019-01-26 MED ORDER — LIDOCAINE-EPINEPHRINE (PF) 1 %-1:200000 IJ SOLN
INTRAMUSCULAR | Status: AC
  Filled 2019-01-26: qty 30

## 2019-01-26 MED ORDER — FENTANYL CITRATE (PF) 100 MCG/2ML IJ SOLN
25.0000 ug | INTRAMUSCULAR | Status: DC | PRN
  Administered 2019-01-26 (×2): 25 ug via INTRAVENOUS

## 2019-01-26 MED ORDER — OXYCODONE HCL 5 MG PO TABS
ORAL_TABLET | ORAL | Status: AC
  Administered 2019-01-26: 5 mg via ORAL
  Filled 2019-01-26: qty 1

## 2019-01-26 MED ORDER — SEVOFLURANE IN SOLN
RESPIRATORY_TRACT | Status: AC
  Filled 2019-01-26: qty 250

## 2019-01-26 MED ORDER — SODIUM CHLORIDE FLUSH 0.9 % IV SOLN
INTRAVENOUS | Status: AC
  Filled 2019-01-26: qty 10

## 2019-01-26 MED ORDER — ACETAMINOPHEN 10 MG/ML IV SOLN
INTRAVENOUS | Status: DC | PRN
  Administered 2019-01-26: 1000 mg via INTRAVENOUS

## 2019-01-26 MED ORDER — THROMBIN 5000 UNITS EX SOLR
CUTANEOUS | Status: DC | PRN
  Administered 2019-01-26: 5000 [IU] via TOPICAL

## 2019-01-26 MED ORDER — FENTANYL CITRATE (PF) 100 MCG/2ML IJ SOLN
INTRAMUSCULAR | Status: AC
  Administered 2019-01-26: 25 ug via INTRAVENOUS
  Filled 2019-01-26: qty 2

## 2019-01-26 MED ORDER — IPRATROPIUM-ALBUTEROL 0.5-2.5 (3) MG/3ML IN SOLN
RESPIRATORY_TRACT | Status: AC
  Filled 2019-01-26: qty 3

## 2019-01-26 MED ORDER — CEFAZOLIN SODIUM-DEXTROSE 2-4 GM/100ML-% IV SOLN
INTRAVENOUS | Status: AC
  Filled 2019-01-26: qty 100

## 2019-01-26 MED ORDER — LIDOCAINE-EPINEPHRINE (PF) 1 %-1:200000 IJ SOLN
INTRAMUSCULAR | Status: DC | PRN
  Administered 2019-01-26: 16 mL

## 2019-01-26 MED ORDER — ACETAMINOPHEN 10 MG/ML IV SOLN
INTRAVENOUS | Status: AC
  Filled 2019-01-26: qty 100

## 2019-01-26 MED ORDER — FENTANYL CITRATE (PF) 250 MCG/5ML IJ SOLN
INTRAMUSCULAR | Status: AC
  Filled 2019-01-26: qty 5

## 2019-01-26 MED ORDER — PROPOFOL 10 MG/ML IV BOLUS
INTRAVENOUS | Status: DC | PRN
  Administered 2019-01-26 (×2): 20 mg via INTRAVENOUS
  Administered 2019-01-26: 150 mg via INTRAVENOUS

## 2019-01-26 MED ORDER — METHYLPREDNISOLONE ACETATE 40 MG/ML IJ SUSP
INTRAMUSCULAR | Status: AC
  Filled 2019-01-26: qty 1

## 2019-01-26 MED ORDER — LACTATED RINGERS IV SOLN
INTRAVENOUS | Status: DC
  Administered 2019-01-26: 07:00:00 via INTRAVENOUS

## 2019-01-26 MED ORDER — BUPIVACAINE HCL (PF) 0.5 % IJ SOLN
INTRAMUSCULAR | Status: AC
  Filled 2019-01-26: qty 30

## 2019-01-26 MED ORDER — OXYCODONE HCL 5 MG PO TABS: 5.0000 mg | ORAL_TABLET | Freq: Four times a day (QID) | ORAL | 0 refills | Status: AC | PRN

## 2019-01-26 MED ORDER — OXYCODONE HCL 5 MG PO TABS
5.0000 mg | ORAL_TABLET | Freq: Once | ORAL | Status: AC
  Administered 2019-01-26: 5 mg via ORAL

## 2019-01-26 MED ORDER — LIDOCAINE HCL (CARDIAC) PF 100 MG/5ML IV SOSY
PREFILLED_SYRINGE | INTRAVENOUS | Status: DC | PRN
  Administered 2019-01-26: 100 mg via INTRATRACHEAL

## 2019-01-26 MED ORDER — ROCURONIUM BROMIDE 100 MG/10ML IV SOLN
INTRAVENOUS | Status: DC | PRN
  Administered 2019-01-26: 10 mg via INTRAVENOUS

## 2019-01-26 MED ORDER — DEXMEDETOMIDINE HCL IN NACL 400 MCG/100ML IV SOLN
INTRAVENOUS | Status: DC | PRN
  Administered 2019-01-26 (×2): 4 ug via INTRAVENOUS

## 2019-01-26 MED ORDER — ONDANSETRON HCL 4 MG/2ML IJ SOLN
INTRAMUSCULAR | Status: DC | PRN
  Administered 2019-01-26: 4 mg via INTRAVENOUS

## 2019-01-26 MED ORDER — PHENYLEPHRINE HCL (PRESSORS) 10 MG/ML IV SOLN
INTRAVENOUS | Status: DC | PRN
  Administered 2019-01-26 (×5): 100 ug via INTRAVENOUS
  Administered 2019-01-26: 50 ug via INTRAVENOUS

## 2019-01-26 SURGICAL SUPPLY — 60 items
BUR NEURO DRILL SOFT 3.0X3.8M (BURR) ×3 IMPLANT
CANISTER SUCT 1200ML W/VALVE (MISCELLANEOUS) ×6 IMPLANT
CHLORAPREP W/TINT 26 (MISCELLANEOUS) ×6 IMPLANT
COUNTER NEEDLE 20/40 LG (NEEDLE) ×3 IMPLANT
COVER LIGHT HANDLE STERIS (MISCELLANEOUS) ×6 IMPLANT
COVER WAND RF STERILE (DRAPES) ×3 IMPLANT
CUP MEDICINE 2OZ PLAST GRAD ST (MISCELLANEOUS) ×3 IMPLANT
DERMABOND ADVANCED (GAUZE/BANDAGES/DRESSINGS) ×2
DERMABOND ADVANCED .7 DNX12 (GAUZE/BANDAGES/DRESSINGS) ×1 IMPLANT
DRAPE C-ARM 42X72 X-RAY (DRAPES) ×6 IMPLANT
DRAPE LAPAROTOMY 100X77 ABD (DRAPES) ×3 IMPLANT
DRAPE MICROSCOPE SPINE 48X150 (DRAPES) IMPLANT
DRAPE SURG 17X11 SM STRL (DRAPES) ×3 IMPLANT
DRSG TEGADERM 4X4.75 (GAUZE/BANDAGES/DRESSINGS) IMPLANT
DRSG TELFA 4X3 1S NADH ST (GAUZE/BANDAGES/DRESSINGS) IMPLANT
DURASEAL APPLICATOR TIP (TIP) IMPLANT
DURASEAL SPINE SEALANT 3ML (MISCELLANEOUS) IMPLANT
ELECT CAUTERY BLADE TIP 2.5 (TIP) ×3
ELECT EZSTD 165MM 6.5IN (MISCELLANEOUS) ×3
ELECT REM PT RETURN 9FT ADLT (ELECTROSURGICAL) ×3
ELECTRODE CAUTERY BLDE TIP 2.5 (TIP) ×1 IMPLANT
ELECTRODE EZSTD 165MM 6.5IN (MISCELLANEOUS) ×1 IMPLANT
ELECTRODE REM PT RTRN 9FT ADLT (ELECTROSURGICAL) ×1 IMPLANT
GAUZE SPONGE 4X4 12PLY STRL (GAUZE/BANDAGES/DRESSINGS) ×3 IMPLANT
GLOVE BIOGEL PI IND STRL 7.0 (GLOVE) ×1 IMPLANT
GLOVE BIOGEL PI INDICATOR 7.0 (GLOVE) ×2
GLOVE INDICATOR 8.0 STRL GRN (GLOVE) ×9 IMPLANT
GLOVE SURG SYN 7.0 (GLOVE) ×6 IMPLANT
GLOVE SURG SYN 7.0 PF PI (GLOVE) ×2 IMPLANT
GLOVE SURG SYN 8.0 (GLOVE) ×3 IMPLANT
GLOVE SURG SYN 8.0 PF PI (GLOVE) ×1 IMPLANT
GOWN STRL REUS W/ TWL XL LVL3 (GOWN DISPOSABLE) ×1 IMPLANT
GOWN STRL REUS W/TWL MED LVL3 (GOWN DISPOSABLE) ×3 IMPLANT
GOWN STRL REUS W/TWL XL LVL3 (GOWN DISPOSABLE) ×2
GRADUATE 1200CC STRL 31836 (MISCELLANEOUS) ×3 IMPLANT
KIT TURNOVER KIT A (KITS) ×3 IMPLANT
KIT WILSON FRAME (KITS) ×3 IMPLANT
KNIFE BAYONET SHORT DISCETOMY (MISCELLANEOUS) IMPLANT
MARKER SKIN DUAL TIP RULER LAB (MISCELLANEOUS) ×6 IMPLANT
NDL SAFETY ECLIPSE 18X1.5 (NEEDLE) ×1 IMPLANT
NEEDLE HYPO 18GX1.5 SHARP (NEEDLE) ×2
NEEDLE HYPO 22GX1.5 SAFETY (NEEDLE) ×5 IMPLANT
NS IRRIG 1000ML POUR BTL (IV SOLUTION) ×3 IMPLANT
PACK LAMINECTOMY NEURO (CUSTOM PROCEDURE TRAY) ×3 IMPLANT
PAD ARMBOARD 7.5X6 YLW CONV (MISCELLANEOUS) ×3 IMPLANT
SPOGE SURGIFLO 8M (HEMOSTASIS) ×2
SPONGE SURGIFLO 8M (HEMOSTASIS) ×1 IMPLANT
STAPLER SKIN PROX 35W (STAPLE) IMPLANT
SUT NURALON 4 0 TR CR/8 (SUTURE) IMPLANT
SUT POLYSORB 2-0 5X18 GS-10 (SUTURE) ×5 IMPLANT
SUT VIC AB 0 CT1 18XCR BRD 8 (SUTURE) ×1 IMPLANT
SUT VIC AB 0 CT1 8-18 (SUTURE) ×2
SYR 10ML LL (SYRINGE) ×6 IMPLANT
SYR 30ML LL (SYRINGE) ×3 IMPLANT
SYR 3ML LL SCALE MARK (SYRINGE) ×3 IMPLANT
TOWEL OR 17X26 4PK STRL BLUE (TOWEL DISPOSABLE) ×12 IMPLANT
TUBE MATRX SPINL 18MM 7CM DISP (INSTRUMENTS) ×2
TUBE METRX SPINAL 18X7 DISP (INSTRUMENTS) IMPLANT
TUBING CONNECTING 10 (TUBING) ×2 IMPLANT
TUBING CONNECTING 10' (TUBING) ×1

## 2019-01-26 NOTE — Progress Notes (Signed)
Received duo neb  Low sat on 2 liters 92 to 93

## 2019-01-26 NOTE — Anesthesia Preprocedure Evaluation (Signed)
Anesthesia Evaluation  Patient identified by MRN, date of birth, ID band Patient awake    Reviewed: Allergy & Precautions, NPO status , Patient's Chart, lab work & pertinent test results, reviewed documented beta blocker date and time   History of Anesthesia Complications Negative for: history of anesthetic complications  Airway Mallampati: II  TM Distance: >3 FB Neck ROM: Full    Dental  (+) Poor Dentition   Pulmonary shortness of breath and with exertion, neg sleep apnea, COPD,  COPD inhaler and oxygen dependent, former smoker,    breath sounds clear to auscultation- rhonchi (-) wheezing      Cardiovascular Exercise Tolerance: Good hypertension, + CAD (CT scan showing mild coronary calcifications in the RCA, left main, LAD, left circumflex.) and + Peripheral Vascular Disease  (-) Past MI + dysrhythmias (hx of ablation) Atrial Fibrillation  Rhythm:Regular Rate:Normal - Systolic murmurs and - Diastolic murmurs Echo 07/13/52: - Left ventricle: The cavity size was normal. Systolic function was   normal. The estimated ejection fraction was in the range of 50%   to 55%. Wall motion was normal; there were no regional wall   motion abnormalities. - Aortic valve: Valve area (Vmax): 2.09 cm^2.   Neuro/Psych  Neuromuscular disease negative psych ROS   GI/Hepatic Neg liver ROS, GERD  ,  Endo/Other  diabetes  Renal/GU negative Renal ROS Bladder dysfunction      Musculoskeletal  (+) Arthritis , Osteoarthritis,    Abdominal (+) + obese,   Peds negative pediatric ROS (+)  Hematology negative hematology ROS (+)   Anesthesia Other Findings Past Medical History: No date: Alcohol abuse, in remission     Comment: remote No date: Arthritis     Comment: BACK AND NECK No date: Benign essential tremor     Comment: improved on metoprolol and gabapentin 12/30/2012: BPH (benign prostatic hypertrophy) 03/2010: Cardiac arrhythmia  Comment: h/o a flutter and CM, s/p ablation, normal               stress test No date: Chronic low back pain     Comment: MRI 2004, multilevel DDD No date: Colon polyps     Comment: next colonoscopy due around 2706 No date: Complication of anesthesia     Comment: DID NOT GET COMPLETELY NUMB WITH TONSILLECTOMY No date: COPD (chronic obstructive pulmonary disease) (* No date: Coronary artery disease No date: Dyslipidemia     Comment: mild off meds No date: GERD (gastroesophageal reflux disease) No date: HTN (hypertension) 02/2015: Multiple pulmonary nodules No date: Neuromuscular disorder ([place])     Comment: neuropathy from chemo....in feet 02/18/2015: Personal history of tobacco use, presenting ha*     Comment: quit 06/2011, restarted 2014 2016: Primary cancer of bladder ([place])     Comment: [place] 2016: Primary lung cancer ([place]) No date: Shortness of breath dyspnea     Comment: OCC WITH EXERTION   Reproductive/Obstetrics                             Anesthesia Physical  Anesthesia Plan  ASA: III  Anesthesia Plan: General   Post-op Pain Management:    Induction: Intravenous  PONV Risk Score and Plan:   Airway Management Planned: Oral ETT  Additional Equipment:   Intra-op Plan:   Post-operative Plan: Extubation in OR and Possible Post-op intubation/ventilation  Informed Consent: I have reviewed the patients History and Physical, chart, labs and discussed the procedure including the risks, benefits and alternatives for  the proposed anesthesia with the patient or authorized representative who has indicated his/her understanding and acceptance.     Dental advisory given  Plan Discussed with: CRNA and Anesthesiologist  Anesthesia Plan Comments:         Anesthesia Quick Evaluation

## 2019-01-26 NOTE — Anesthesia Postprocedure Evaluation (Signed)
Anesthesia Post Note  Patient: Joseph Graham  Procedure(s) Performed: LEFT L5-S1 FAR LATERAL APPROACH RESECTION OF TRANSVERSE PROCESS (N/A )  Patient location during evaluation: PACU Anesthesia Type: General Level of consciousness: awake and alert and oriented Pain management: pain level controlled Vital Signs Assessment: post-procedure vital signs reviewed and stable Respiratory status: spontaneous breathing, nonlabored ventilation and respiratory function stable Cardiovascular status: blood pressure returned to baseline and stable Postop Assessment: no signs of nausea or vomiting Anesthetic complications: no     Last Vitals:  Vitals:   01/26/19 1145 01/26/19 1155  BP: (!) 113/49 (!) 108/42  Pulse: 65 70  Resp: (!) 23 15  Temp:  36.4 C  SpO2: 95% 94%    Last Pain:  Vitals:   01/26/19 1155  TempSrc:   PainSc: 6                  Ar[place] Moist

## 2019-01-26 NOTE — Progress Notes (Signed)
Patient walked and urinated and states he is fine. He wants to keep his oxycodone prescription just in case he needs it.

## 2019-01-26 NOTE — H&P (Addendum)
Joseph Graham is an 78 y.o. male.   Chief Complaint: Left back pain HPI:[name] is here for evaluation of ongoing back pain. He was referred for injections and he has undergone multiple different types. He does state that he did experience some good relief from the left pars injection at L5. He states that it did give him a couple days of relief. He also had a epidural injection over the past few months which did relieve a lot of his lower extremity symptoms but he still experiences peripheral neuropathy that is chronic. He does state he has numbness from the knees down. He continues to use the walker. He feels that he can relieve his back pain in the left lower region when he leans to the right and forward and currently the back pain is with limited him most from walking prolonged period. He is here to discuss any further options   Past Medical History:  Diagnosis Date  . Alcohol abuse, in remission    remote   . Arthritis    BACK AND NECK  . Benign essential tremor    improved on metoprolol and gabapentin  . BPH (benign prostatic hypertrophy) 12/30/2012  . Cardiac arrhythmia 03/2010   h/o a flutter and CM, s/p ablation, normal stress test  . Cataract    bilat removed   . Chemotherapy-induced neutropenia ([place]) 09/23/2015  . Chronic low back pain    MRI 2004, multilevel DDD  . Colon polyps    next colonoscopy due around 2018  . Complication of anesthesia age 11-23   DID NOT GET COMPLETELY NUMB WITH TONSILLECTOMY  . COPD (chronic obstructive pulmonary disease) ([place])   . Coronary artery disease   . Dyslipidemia    mild off meds  . GERD (gastroesophageal reflux disease)   . History of kidney stones    h/o  . HTN (hypertension)   . Macular degeneration, bilateral   . Multiple pulmonary nodules 02/2015  . Neuromuscular disorder ([place])    neuropathy from chemo....in feet  . Peripheral neuropathy due to chemotherapy ([place]) 12/23/2015   Saw Dr Manuella Ghazi, on gabapentin and cymbalta with  hydrocodone PRN (03/2016)  . Personal history of tobacco use, presenting hazards to health 02/18/2015   quit 06/2011, restarted 2014  . Pneumothorax after biopsy 04/01/2015  . Pre-diabetes   . Primary cancer of bladder ([place]) 2016   [place]  . Primary lung cancer ([place]) 2016   port a cath removed 03/2016  . Shortness of breath dyspnea    OCC WITH EXERTION    Past Surgical History:  Procedure Laterality Date  . A FLUTTER ABLATION  03/2010  . carotid US  03/2010   WNL  . CATARACT EXTRACTION    . COLONOSCOPY  09/2011   polyps, rpt due 5 yrs, mild diverticulosis [name])  . CYSTOSCOPY W/ RETROGRADES Bilateral 04/06/2015   Procedure: CYSTOSCOPY WITH RETROGRADE PYELOGRAM;  Surgeon: [name], MD;  Location: [facility] ORS;  Service: Urology;  Laterality: Bilateral;  . ELECTROMAGNETIC NAVIGATION BROCHOSCOPY N/A 03/02/2015   Procedure: ELECTROMAGNETIC NAVIGATION BRONCHOSCOPY;  Surgeon: [name], MD;  Location: [facility] ORS;  Service: Cardiopulmonary;  Laterality: N/A;  . [place]  . LUNG BIOPSY     collapsed lung with chest tube  . POLYPECTOMY    . PORT-A-CATH REMOVAL Right 04/03/2016   Procedure: REMOVAL PORT-A-CATH;  Surgeon: [name], MD;  Location: [facility] ORS;  Service: General;  Laterality: Right;  . PORTACATH PLACEMENT N/A 08/10/2015   Procedure: INSERTION PORT-A-CATH;  Surgeon: [name], MD;  Location: [facility] ORS;  Service: General;  Laterality: N/A;  . [place]  . TRANSURETHRAL RESECTION OF BLADDER TUMOR N/A 04/06/2015   Procedure: TRANSURETHRAL RESECTION OF BLADDER TUMOR (TURBT) right ureteral stent placement;  Surgeon: [name], MD;  Location: [facility] ORS;  Service: Urology;  Laterality: N/A;  . TRANSURETHRAL RESECTION OF BLADDER TUMOR N/A 07/13/2015   Procedure: TRANSURETHRAL RESECTION OF BLADDER TUMOR (TURBT);  Surgeon: [name], MD;  Location: [facility] ORS;  Service: Urology;  Laterality: N/A;  . urinary stent removal  04/14/15    Family  History  Problem Relation Age of Onset  . Cervical cancer Mother 73  . Hypertension Father   . Tremor Father        and several uncles/aunts (not parkinson's)  . Stroke Brother 11  . Bladder Cancer Brother 30  . Prostate cancer Neg Hx   . Kidney cancer Neg Hx   . Colon cancer Neg Hx   . Colon polyps Neg Hx   . Esophageal cancer Neg Hx   . Rectal cancer Neg Hx    Social History:  reports that he quit smoking about 3 years ago. His smoking use included cigarettes. He has a 91.50 pack-year smoking history. He has never used smokeless tobacco. He reports that he does not drink alcohol or use drugs.  Allergies: No Known Allergies  Medications Prior to Admission  Medication Sig Dispense Refill  . acetaminophen (TYLENOL) 500 MG tablet Take 1,000 mg by mouth every 6 (six) hours as needed (pain/headaches.).    Marland Kitchen albuterol (PROAIR HFA) 108 (90 Base) MCG/ACT inhaler Inhale 2 puffs into the lungs every 6 (six) hours as needed for wheezing or shortness of breath. 1 Inhaler 3  . Cholecalciferol (VITAMIN D-3) 125 MCG (5000 UT) TABS Take 5,000 Units by mouth daily.    Marland Kitchen gabapentin (NEURONTIN) 300 MG capsule Take 300-600 mg by mouth See admin instructions. Take 1 capsule (300 mg) by mouth in the morning, 1 capsule (300 mg) by mouth at supper, & 2 capsules (600 mg) by mouth at bedtime.    Marland Kitchen HYDROcodone-acetaminophen (NORCO) 5-325 MG tablet Take 1 tablet by mouth every 8 (eight) hours as needed for moderate pain. (Patient taking differently: Take 1 tablet by mouth 2 (two) times daily. ) 90 tablet 0  . lisinopril (PRINIVIL,ZESTRIL) 10 MG tablet TAKE 1 TABLET BY MOUTH EVERY EVENING (Patient taking differently: Take 10 mg by mouth every evening. ) 90 tablet 3  . metoprolol tartrate (LOPRESSOR) 25 MG tablet TAKE 1/2 TABLET BY MOUTH TWICE DAILY (Patient taking differently: Take 12.5 mg by mouth 2 (two) times daily. ) 90 tablet 3  . Multiple Vitamin (MULTIVITAMIN WITH MINERALS) TABS tablet Take 1 tablet by mouth  every evening.    . NON FORMULARY Take 500 mg by mouth daily. Hemp Oil 500 mg    . omeprazole (PRILOSEC) 20 MG capsule Take 20 mg by mouth every morning.     . OXYGEN Inhale 2 L into the lungs daily.    . polyethylene glycol (MIRALAX / GLYCOLAX) packet Take 17 g by mouth daily.    . primidone (MYSOLINE) 50 MG tablet Take 150 mg by mouth 2 (two) times a day.    . vitamin B-12 (CYANOCOBALAMIN) 100 MCG tablet Take 100 mcg by mouth every evening.    . vitamin E 400 UNIT capsule Take 400 Units by mouth every evening.       No results found for this or  any previous visit (from the past 48 hour(s)). No results found.  ROS General ROS: Negative Respiratory ROS: Negative Cardiovascular ROS: Negative Gastrointestinal ROS: Negative Genito-Urinary ROS: Negative Musculoskeletal ROS: Positive for back pain Neurological ROS: Positive for chronic leg numbness Dermatological ROS: Negative  Blood pressure (!) 146/64, pulse 61, temperature (!) 97.4 F (36.3 C), temperature source Oral, resp. rate 18, height 5\' 9"  (1.753 m), weight 84.8 kg, SpO2 95 %. Physical Exam  General appearance: Alert, cooperative, in no acute distress Back: No tenderness to palpation of the midline or paramedian regions CV: Regular rate Pulm: Clear to auscultation  Neurologic exam:  Mental status: alertness: alert affect: normal Speech: fluent and clear Motor:strength symmetric 5/5 in bilateral hip flexion, knee flexion, knee extension with dorsiflexion, plantarflexion Sensory: Decreased to light touch from the knees below circumferentially in both legs Gait: Slow gait, using walker  X-ray pelvis: There is a pseudoarthrosis noted on the left at L5-S1 with the transverse process and the sacral/iliac crest.  CT Lumbar Spine: L5-S1: Transitional level. Endplate osteophytes and bulging of the disc more prominent towards the left. No central canal stenosis. Mild encroachment upon the subarticular lateral recess on the  left with some potential to affect the left S1 nerve. Bridging osteophytes anteriorly there probably solid. 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 Mild irregularity of the tip of the right transverse process probably relates to ligamentous stress.  Assessment/Plan Left Low Back Pain  - Plan far lateral approach to left L5/S1 transverse process and resection   [name], MD 01/26/2019, 6:35 AM

## 2019-01-26 NOTE — Discharge Summary (Signed)
Procedure: Left L5-S1 far lateral approach resection of transverse process Procedure date: 01/26/2019 Diagnosis: Congenital fusion of spine, Bertolotti's syndrome  History: Joseph Graham is s/p left L5-S1 far lateral approach resection of transverse process for Bertolotti's syndrome.  POD0: Evaluated in post op recovery, still disoriented from anesthesia.   Physical Exam: Vitals:   01/26/19 0619  BP: (!) 146/64  Pulse: 61  Resp: 18  Temp: (!) 97.4 F (36.3 C)  SpO2: 95%    Strength: unable to fully assess. Able to move all extremities independently.  Sensation: unable to accurately assess at this time.  Skin: dressing clean and dry at lumbar incision site.   Data:  Recent Labs  Lab 01/19/19 1135  NA 142  K 4.0  CL 101  CO2 32  BUN 28*  CREATININE 0.58*  GLUCOSE 107*  CALCIUM 9.1   No results for input(s): AST, ALT, ALKPHOS in the last 168 hours.  Invalid input(s): TBILI   Recent Labs  Lab 01/19/19 1135  WBC 7.5  HGB 13.4  HCT 42.9  PLT 272   Recent Labs  Lab 01/19/19 1135  APTT 34  INR 1.1         Other tests/results: No imaging reviewed  Assessment/Plan:  [name] POD 0 status post left L5-S1 far lateral resection of transverse process. Continue postop pain control with Tylenol, oxycodone (in addition to baseline norco), and tizanidine as needed.  Patient is scheduled to follow-up in approximately 2 weeks to monitor progress.  Marin Olp PA-C Department of Neurosurgery

## 2019-01-26 NOTE — Interval H&P Note (Signed)
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  The various methods of treatment have been discussed with the patient and family. After consideration of risks, benefits and other options for treatment, the patient has consented to  Procedure(s): LEFT L5-S1 FAR LATERAL APPROACH RESECTION OF TRANSVERSE PROCESS (N/A) as a surgical intervention.  The patient's history has been reviewed, patient examined, no change in status, stable for surgery.  I have reviewed the patient's chart and labs.  Questions were answered to the patient's satisfaction.     [name]

## 2019-01-26 NOTE — Anesthesia Post-op Follow-up Note (Signed)
Anesthesia QCDR form completed.        

## 2019-01-26 NOTE — Transfer of Care (Signed)
Immediate Anesthesia Transfer of Care Note  Patient: Joseph Graham  Procedure(s) Performed: LEFT L5-S1 FAR LATERAL APPROACH RESECTION OF TRANSVERSE PROCESS (N/A )  Patient Location: PACU  Anesthesia Type:General  Level of Consciousness: awake, alert  and oriented  Airway & Oxygen Therapy: Patient Spontanous Breathing and Patient connected to nasal cannula oxygen  Post-op Assessment: Report given to RN  Post vital signs: Reviewed and stable  Last Vitals:  Vitals Value Taken Time  BP 98/38 01/26/19 1024  Temp 36.8 C 01/26/19 1024  Pulse 65 01/26/19 1031  Resp 11 01/26/19 1031  SpO2 98 % 01/26/19 1031  Vitals shown include unvalidated device data.  Last Pain:  Vitals:   01/26/19 0619  TempSrc: Oral  PainSc: 0-No pain         Complications: No apparent anesthesia complications

## 2019-01-26 NOTE — Op Note (Signed)
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 seen in clinic on7/28with ongoing left back pain preventing movement.Hehad failed conservative management of steroids,prescription medications, and therapyand the symptoms were affecting hislifestyle. An injection at the left L5/S1 pars and TP showed some relief. 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 Therisks of surgery were explained to include hematoma, infection, damage to nerve roots, weakness, numbness, pain, need for future surgery including fusion, heart attack, and stroke.Heelected to proceed with surgery for symptom relief.   Procedure The patient was brought to the OR after informed consent was obtained.He was given general anesthesia and intubated by the anesthesia service. Vascular access lines were placed.The patient was then placed prone on a Wilson frameensuring all pressure points were padded.Antibiotics were administered.A time-out was performed per protocol.   The patient was sterilely prepped and draped. Fluoroscopy confirmedleft L5/S1 transverse processandanincision was planned5 cm off midlineon theleft.The incision was instilled withlocal anesthetic with epinephrine. The skin was opened sharply and the dissection taken to the fascia. This was incised and initial dilator placedon the left L5 TPat the lateral edge of the facet. Serial  dilatorswere inserted via fluoroscopy and the final72mm tube was placed at depth of7cm.  The microscope was brought into the field. The overlying muscle was removed from the TP. The superior border was palpated and the bone was seen to continue deep and lateral towards the sacrum. Next, a matchstickdrill bit was used to remove theTP starting superiorly and working both inferior and lateral. Once this bone was removed, the L4 nerve root was seen just deep to this and protected. The remaining edges of the bone were removed with rongeurs until the L5 pedicle borders could be palpated. We worked laterally and removed more bone. At this point, a wide defect was seen and all neural tissue deep intact. Curette was used and no significant remaining bone could be seen between the pars and sacrum.   Multiple rounds of irrigation were used. Hemostasis was obtained.Depomedrol was placed along the nerve root.The microscope was removed.  Themuscle andfasciawas then closed using 0and 2-0vicryl followed by thesubcutaneous and dermal layers with 2-0 vicryluntil the epidermis was well approximated. The skin was closed withDermabond..  The patient was returned to supine position and extubated by the anesthesia service. The patient was then taken to the PACU for post-operative care whereshe was moving extremities symmetrically.   ESTIMATED BLOOD LOSS: 20cc  SPECIMENS None  IMPLANT None   I performed the case in its entiretywith assistance of PA, Corrie Mckusick, [place]

## 2019-01-26 NOTE — Discharge Instructions (Addendum)
Your surgeon has performed an operation on your lumbar spine (low back) to relieve pressure on one or more nerves. Many times, patients feel better immediately after surgery and can overdo it. Even if you feel well, it is important that you follow these activity guidelines. If you do not let your back heal properly from the surgery, you can increase the chance of a disc herniation and/or return of your symptoms. The following are instructions to help in your recovery once you have been discharged from the hospital.  * Do not take anti-inflammatory medications for 3 days after surgery (naproxen [Aleve], ibuprofen [Advil, Motrin], celecoxib [Celebrex], etc.) * take oxycodone and muscle relaxer on a 'as needed' basis.  * You may take tylenol.  Activity    No bending, lifting, or twisting (BLT). Avoid lifting objects heavier than 10 pounds (gallon milk jug).  Where possible, avoid household activities that involve lifting, bending, pushing, or pulling such as laundry, vacuuming, grocery shopping, and childcare. Try to arrange for help from friends and family for these activities while your back heals.  Increase physical activity slowly as tolerated.  Taking short walks is encouraged, but avoid strenuous exercise. Do not jog, run, bicycle, lift weights, or participate in any other exercises unless specifically allowed by your doctor. Avoid prolonged sitting, including car rides.  Talk to your doctor before resuming sexual activity.  You should not drive until cleared by your doctor.  Until released by your doctor, you should not return to work or school.  You should rest at home and let your body heal.   You may shower two days after your surgery.  After showering, lightly dab your incision dry. Do not take a tub bath or go swimming for 3 weeks, or until approved by your doctor at your follow-up appointment.  If you smoke, we strongly recommend that you quit.  Smoking has been proven to  interfere with normal healing in your back and will dramatically reduce the success rate of your surgery. Please contact [facility] (800-QUIT-NOW) and use the resources at www.[facility].com for assistance in stopping smoking.  Surgical Incision   If you have a dressing on your incision, you may remove it three days after your surgery. Keep your incision area clean and dry.  If you have staples or stitches on your incision, you should have a follow up scheduled for removal. If you do not have staples or stitches, you will have steri-strips (small pieces of surgical tape) or Dermabond glue. The steri-strips/glue should begin to peel away within about a week (it is fine if the steri-strips fall off before then). If the strips are still in place one week after your surgery, you may gently remove them.  Diet            You may return to your usual diet. Be sure to stay hydrated.  When to Contact us  Although your surgery and recovery will likely be uneventful, you may have some residual numbness, aches, and pains in your back and/or legs. This is normal and should improve in the next few weeks.  However, should you experience any of the following, contact us immediately:  New numbness or weakness  Pain that is progressively getting worse, and is not relieved by your pain medications or rest  Bleeding, redness, swelling, pain, or drainage from surgical incision  Chills or flu-like symptoms  Fever greater than 101.0 F (38.3 C)  Problems with bowel or bladder functions  Difficulty breathing or shortness of  breath  Warmth, tenderness, or swelling in your calf  Contact Information  During office hours (Monday-Friday 9 am to 5 pm), please call your physician at [phone]  After hours and weekends, please call the [place] Operator at [phone] and ask for the Neurosurgery Resident On Call   For a life-threatening emergency, call [place]   1) The drugs that you were given will stay in your system until tomorrow so for the next 24 hours you should not:  A) Drive an automobile B) Make any legal decisions C) Drink any alcoholic beverage   2) You may resume regular meals tomorrow.  Today it is better to start with liquids and gradually work up to solid foods.  You may eat anything you prefer, but it is better to start with liquids, then soup and crackers, and gradually work up to solid foods.   3) Please notify your doctor immediately if you have any unusual bleeding, trouble breathing, redness and pain at the surgery site, drainage, fever, or pain not relieved by medication.    4) Additional Instructions:        Please contact your physician with any problems or Same Day Surgery at [phone], Monday through Friday 6 am to 4 pm, or [place] at [facility] number at [phone].

## 2019-01-26 NOTE — OR Nursing (Signed)
Patient states that pain is at 6, refuses pain pill at this time, states he has chronic pain and lives with pain level of 4 or 5.

## 2019-02-05 ENCOUNTER — Encounter: Payer: Self-pay | Admitting: Neurosurgery

## 2019-02-06 ENCOUNTER — Encounter: Payer: Self-pay | Admitting: Family Medicine

## 2019-02-09 ENCOUNTER — Telehealth: Payer: Medicare Other | Admitting: Cardiovascular Disease

## 2019-02-11 ENCOUNTER — Encounter: Payer: Self-pay | Admitting: Pain Medicine

## 2019-02-12 ENCOUNTER — Ambulatory Visit: Payer: Medicare Other | Attending: Pain Medicine | Admitting: Pain Medicine

## 2019-02-12 ENCOUNTER — Other Ambulatory Visit: Payer: Self-pay

## 2019-02-12 DIAGNOSIS — G608 Other hereditary and idiopathic neuropathies: Secondary | ICD-10-CM

## 2019-02-12 DIAGNOSIS — M5137 Other intervertebral disc degeneration, lumbosacral region: Secondary | ICD-10-CM

## 2019-02-12 DIAGNOSIS — M533 Sacrococcygeal disorders, not elsewhere classified: Secondary | ICD-10-CM | POA: Diagnosis not present

## 2019-02-12 NOTE — Progress Notes (Signed)
Pain Management Virtual Encounter Note - Virtual Visit via Telephone Telehealth (real-time audio visits between healthcare provider and patient).   Patient's Phone No. & Preferred Pharmacy:  [phone] (home); [phone] (mobile); (Preferred) [phone] [email or web address]  [id number] - [place], [place] [address] [place] [address] Phone: [phone] Fax: [phone]    Pre-screening note:  Our staff contacted Joseph Graham and offered him an "in person", "face-to-face" appointment versus a telephone encounter. He indicated preferring the telephone encounter, at this time.   Reason for Virtual Visit: COVID-19*  Social distancing based on CDC and AMA recommendations.   I contacted Joseph Graham on 02/12/2019 via telephone.      I clearly identified myself as [name], MD. I verified that I was speaking with the correct person using two identifiers (Name: Joseph Graham, and date of birth: 1940-10-12).  Advanced Informed Consent I sought verbal advanced consent from Joseph Graham for virtual visit interactions. I informed Joseph Graham of possible security and privacy concerns, risks, and limitations associated with providing "not-in-person" medical evaluation and management services. I also informed Joseph Graham of the availability of "in-person" appointments. Finally, I informed him that there would be a charge for the virtual visit and that he could be  personally, fully or partially, financially responsible for it. Joseph Graham expressed understanding and agreed to proceed.   Historic Elements   Joseph Graham is a 78 y.o. year old, male patient evaluated today after his last encounter by our practice on 01/16/2019. Joseph Graham  has a past medical history of Alcohol abuse, in remission, Arthritis, Benign essential tremor, BPH (benign prostatic hypertrophy) (12/30/2012),  Cardiac arrhythmia (03/2010), Cataract, Chemotherapy-induced neutropenia ([place]) (09/23/2015), Chronic low back pain, Colon polyps, Complication of anesthesia (age [age]), COPD (chronic obstructive pulmonary disease) ([place]), Coronary artery disease, Dyslipidemia, GERD (gastroesophageal reflux disease), History of kidney stones, HTN (hypertension), Macular degeneration, bilateral, Multiple pulmonary nodules (02/2015), Neuromuscular disorder ([place]), Peripheral neuropathy due to chemotherapy ([place]) (12/23/2015), Personal history of tobacco use, presenting hazards to health (02/18/2015), Pneumothorax after biopsy (04/01/2015), Pre-diabetes, Primary cancer of bladder ([place]) (2016), Primary lung cancer ([place]) (2016), and Shortness of breath dyspnea. He also  has a past surgical history that includes Hemorroidectomy (1982); A flutter ablation (03/2010); Cataract extraction; carotid US (03/2010); Colonoscopy (09/2011); Tonsillectomy (1964); Electormagnetic navigation bronchoscopy (N/A, 03/02/2015); Transurethral resection of bladder tumor (N/A, 04/06/2015); Cystoscopy w/ retrogrades (Bilateral, 04/06/2015); urinary stent removal (04/14/15); Transurethral resection of bladder tumor (N/A, 07/13/2015); Portacath placement (N/A, 08/10/2015); Port-a-cath removal (Right, 04/03/2016); Polypectomy; Lung biopsy; and Hemi-microdiscectomy lumbar laminectomy level 1 (N/A, 01/26/2019). Joseph Graham has a current medication list which includes the following prescription(s): acetaminophen, albuterol, vitamin d-3, gabapentin, hydrocodone-acetaminophen, lisinopril, metoprolol tartrate, multivitamin with minerals, NON FORMULARY, omeprazole, oxygen-helium, polyethylene glycol, primidone, tizanidine, vitamin b-12, and vitamin e. He  reports that he quit smoking about 3 years ago. His smoking use included cigarettes. He has a 91.50 pack-year smoking history. He has never used smokeless tobacco. He reports that he does not drink alcohol or use drugs. Joseph Graham has  No Known Allergies.   HPI  Today, he is being contacted for a post-procedure assessment.  Once again, the patient has done extremely well with caudal epidural steroid injection.  He has several things going on.  He has a peripheral neuropathy that is currently being treated by Dr. Manuella Ghazi (neurologist at [facility]) with gabapentin (Neurontin) 300 mg  p.o. twice daily and 600 mg at bedtime.  He is currently taking only 1200 mg/day and I have recommended that he talk to Dr. Manuella Ghazi about the medicine, should he feel that he could be doing better with the peripheral neuropathy.  On the other hand, he also has some problems with pain in the area of the coccyx and weakness in the lower extremities and numbness in his feet, which seem to improve significantly whenever he has a caudal epidural steroid injection.  Today he asked me about the alternatives and we talked about several things including radiofrequency ablation of the sacrococcygeal nerve, but he indicates that he is concerned that that would not do much for the weakness of the legs, which I tend to agree with that.  His primary question today was: How often can I have the caudal epidural steroid injections done?  Today I have explained to him that he could have those done every other month, should he need it.  However, each 1 of these injections has been lasting close to 3 months and therefore he feels that he probably would not need it any sooner than that.  This would come out to be a total of 4 injections/year, which I think is reasonable to keep him pain-free and able to ambulate.  He agreed with that and therefore today we have decided to simply leave his future treatment to PRN caudal epidural steroid injections, when needed.  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  He thinks that this has helped considerably in terms of his range  of motion and decreasing his pain at the area of the lower back.  Post-Procedure Evaluation  Procedure: Diagnostic/therapeutic midline caudal epidural steroid injection #2 under fluoroscopic guidance and IV sedation Pre-procedure pain level:  7/10 Post-procedure: 0/10 (100% relief)  Sedation: Sedation provided.  Effectiveness during initial hour after procedure(Ultra-Short Term Relief): 100 %   Local anesthetic used: Long-acting (4-6 hours) Effectiveness: Defined as any analgesic benefit obtained secondary to the administration of local anesthetics. This carries significant diagnostic value as to the etiological location, or anatomical origin, of the pain. Duration of benefit is expected to coincide with the duration of the local anesthetic used.  Effectiveness during initial 4-6 hours after procedure(Short-Term Relief): 100 %   Long-term benefit: Defined as any relief past the pharmacologic duration of the local anesthetics.  Effectiveness past the initial 6 hours after procedure(Long-Term Relief): 100 % (ongoing)   Current benefits: Defined as benefit that persist at this time.   Analgesia:  90-100% better Function: Joseph Graham reports improvement in function ROM: Joseph Graham reports improvement in ROM  Pharmacotherapy Assessment  Analgesic: No opioid analgesics prescribed by our practice. Hydrocodone/APAP 5/325 mg, 1 tab PO q8 hrs (15 mg/day of hydrocodone) (Last prescribed on 11/14/2018, by Verlon Au, NP. MME/day:15mg /day.   Monitoring: Pharmacotherapy: No side-effects or adverse reactions reported. [place] PMP: PDMP reviewed during this encounter.       Compliance: No problems identified. Effectiveness: Clinically acceptable. Plan: Refer to "POC".  UDS:  Summary  Date Value Ref Range Status  07/14/2018 FINAL  Final    Comment:    ==================================================================== TOXASSURE COMP DRUG  ANALYSIS,UR ==================================================================== Test                             Result  Flag       Units Drug Present and Declared for Prescription Verification   Hydrocodone                    185          EXPECTED   ng/mg creat   Hydromorphone                  207          EXPECTED   ng/mg creat   Norhydrocodone                 563          EXPECTED   ng/mg creat    Sources of hydrocodone include scheduled prescription    medications. Hydromorphone and norhydrocodone are expected    metabolites of hydrocodone. Hydromorphone is also available as a    scheduled prescription medication.   Primidone                      PRESENT      EXPECTED   Phenobarbital                  PRESENT      EXPECTED    Phenobarbital is an expected metabolite of primidone;    Phenobarbital may also be administered as a prescription drug.   Gabapentin                     PRESENT      EXPECTED   Acetaminophen                  PRESENT      EXPECTED Drug Absent but Declared for Prescription Verification   Metoprolol                     Not Detected UNEXPECTED ==================================================================== Test                      Result    Flag   Units      Ref Range   Creatinine              96               mg/dL      >=20 ==================================================================== Declared Medications:  The flagging and interpretation on this report are based on the  following declared medications.  Unexpected results may arise from  inaccuracies in the declared medications.  **Note: The testing scope of this panel includes these medications:  Gabapentin  Hydrocodone (Hydrocodone-Acetaminophen)  Metoprolol (Lopressor)  Primidone (Mysoline)  **Note: The testing scope of this panel does not include small to  moderate amounts of these reported medications:  Acetaminophen  Acetaminophen (Hydrocodone-Acetaminophen)  **Note: The testing  scope of this panel does not include following  reported medications:  Cannabidiol  Cholecalciferol  Cyanocobalamin  Lisinopril  Multivitamin  Omeprazole (Prilosec)  Polyethylene Glycol  Vitamin E ==================================================================== For clinical consultation, please call [phone]. ====================================================================    Laboratory Chemistry Profile (12 mo)  Renal: 07/14/2018: BUN/Creatinine Ratio 35 01/19/2019: BUN 28; Creatinine, Ser 0.58  Lab Results  Component Value Date   GFRAA >60 01/19/2019   GFRNONAA >60 01/19/2019   Hepatic: 07/14/2018: Albumin 4.6 Lab Results  Component Value Date   AST 19 07/14/2018   ALT 19 07/04/2018   Other: 07/14/2018: 25-Hydroxy, Vitamin D 30; 25-Hydroxy, Vitamin D-2 <1.0; 25-Hydroxy, Vitamin D-3 30;  CRP 3; Sed Rate 51; Vitamin B-12 735 Note: Above Lab results reviewed.  Imaging  Last 90 days:  Dg Lumbar Spine 2-3 Views  Result Date: 01/26/2019 CLINICAL DATA:  Left L5-S1 resection of transverse process. EXAM: DG C-ARM 61-120 MIN; LUMBAR SPINE - 2-3 VIEW Radiation exposure index: 7.7033 mGy. COMPARISON:  CT scan of January 22, 2019. FINDINGS: Two intraoperative fluoroscopic images were obtained of the lower lumbar spine. These demonstrate surgical localization of posterior elements of L5 as well as the posterior portion of L5-S1 disc space. IMPRESSION: Fluoroscopic guidance provided during lower lumbar surgery. Electronically Signed   By: Marijo Conception M.D.   On: 01/26/2019 10:07   Ct Lumbar Spine Wo Contrast  Result Date: 01/22/2019 CLINICAL DATA:  Low back pain.  Preoperative planning scan. EXAM: CT LUMBAR SPINE WITHOUT CONTRAST TECHNIQUE: Multidetector CT imaging of the lumbar spine was performed without intravenous contrast administration. Multiplanar CT image reconstructions were also generated. COMPARISON:  MRI 06/12/2018.  Radiography 07/21/2018 FINDINGS: Segmentation: 5  lumbar type vertebral bodies. L5 is transitional as described below. Alignment: Mild curvature convex to the left. Straightening of the normal lumbar lordosis. Vertebrae: No fracture.  No primary bone lesion. Paraspinal and other soft tissues: Negative except for aortic atherosclerosis. Disc levels: No disc abnormality at T11-12, T12-L1 or L1-2. No stenosis. L2-3: Disc space narrowing with endplate osteophytes. Mild narrowing of the right lateral recess but no definite neural compression. L3-4: Disc degeneration with loss of disc height and vacuum phenomenon. Endplate osteophytes. Mild narrowing of both lateral recesses but no visible neural compression. L4-5: Chronic disc degeneration with loss of disc height. There appears to be solid fusion across the disc level. Prominent posterior osteophytes projecting slightly more towards the right and extending down behind the superior portion of L5. These encroach upon the spinal canal and cause lateral recess stenosis, worse on the right than on the left. L5-S1: Transitional level. Endplate osteophytes and bulging of the disc more prominent towards the left. No central canal stenosis. Mild encroachment upon the subarticular lateral recess on the left with some potential to affect the left S1 nerve. Bridging osteophytes anteriorly there probably solid. 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 Mild irregularity of the tip of the right transverse process probably relates to ligamentous stress. Both sacroiliac joints show mild osteoarthritis. IMPRESSION: L2-3: Disc degeneration with endplate osteophytes more prominent towards the right. Right lateral recess narrowing but no definite neural compression. L3-4: Disc degeneration with endplate osteophytes. Mild bilateral lateral recess narrowing but no definite neural compression. L4-5: Fusion across the disc space. Bone encroaches upon the spinal canal  and proximal foramen on the right. Narrowing of the right lateral recess could possibly affect the right L5 nerve. The L4 nerve passes adjacent to the fusion bone but does not appear to be compressed. L5-S1: Transitional level. 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 Mild irregularity of the tip of the prominent transverse process on the right, probably secondary to ligamentous stress. Osteophytes resulting in mild narrowing of the subarticular lateral recess on the left which could possibly affect S1 nerve. It does appear that there is probably solid bridging osteophyte formation at this level. Electronically Signed   By: Nelson Chimes M.D.   On: 01/22/2019 19:46   Dg Pain Clinic C-arm 1-60 Min No Report  Result Date: 01/15/2019 Fluoro was used, but no Radiologist interpretation will be provided. Please refer to "NOTES"  tab for provider progress note.  Dg Pain Clinic C-arm 1-60 Min No Report  Result Date: 12/08/2018 Fluoro was used, but no Radiologist interpretation will be provided. Please refer to "NOTES" tab for provider progress note.  Dg C-arm 1-60 Min  Result Date: 01/26/2019 CLINICAL DATA:  Left L5-S1 resection of transverse process. EXAM: DG C-ARM 61-120 MIN; LUMBAR SPINE - 2-3 VIEW Radiation exposure index: 7.7033 mGy. COMPARISON:  CT scan of January 22, 2019. FINDINGS: Two intraoperative fluoroscopic images were obtained of the lower lumbar spine. These demonstrate surgical localization of posterior elements of L5 as well as the posterior portion of L5-S1 disc space. IMPRESSION: Fluoroscopic guidance provided during lower lumbar surgery. Electronically Signed   By: Marijo Conception M.D.   On: 01/26/2019 10:07    Assessment  The primary encounter diagnosis was Coccygodynia. Diagnoses of Chronic coccyx pain (Secondary Area of Pain) (Midline), DDD (degenerative disc disease), lumbosacral, and Mixed sensory-motor polyneuropathy were also pertinent to  this visit.  Plan of Care  I am having Joseph Graham "Clair Gulling" maintain his omeprazole, polyethylene glycol, NON FORMULARY, gabapentin, lisinopril, metoprolol tartrate, albuterol, HYDROcodone-acetaminophen, primidone, vitamin E, vitamin B-12, acetaminophen, multivitamin with minerals, Vitamin D-3, OXYGEN, and tiZANidine.  Pharmacotherapy (Medications Ordered): No orders of the defined types were placed in this encounter.  Orders:  Orders Placed This Encounter  Procedures  . Caudal Epidural Injection    TIMEFRAME: PRN procedure. (Mr. Buchberger will call when needed.)    Standing Status:   Standing    Number of Occurrences:   1    Standing Expiration Date:   02/12/2020    Scheduling Instructions:     Seizure: Caudal epidural steroid injection     Laterality: Midline     Level(s): Sacrococcygeal canal (Tailbone area)     Sedation: Patient's choice    Order Specific Question:   Where will this procedure be performed?    Answer:   [facility]   Follow-up plan:   Return if symptoms worsen or fail to improve, for PRN- Procedure.      Interventional management options: Considering: Possible Racz procedure Diagnostic coccygeal injection DiagnosticrightL4-5 LESI Diagnostic right L3-4 LESI Diagnostic right L2 TFESI Diagnostic right L3 TFESI Diagnostic right L4 TFESI DiagnosticbilateralLSB Possible bilateral lumbar facetRFA Diagnostic bilateral S-Iblock Possible bilateral S-I jointRFA   Palliative PRN treatment(s): Palliative midline caudal ESIs #3 (he typically gets 100% relief of the pain for about 3 months) Palliative bilateral lumbar facet blocks #2 (100/100/90/90) (helped w/ LE spasms & LBP)  Diagnosticleft L5 transverse process and sacrumpseudoarticulationinjection #2     Recent Visits Date Type Provider Dept  01/15/19 Procedure visit Milinda Pointer, MD [facility]  12/31/18 Office Visit Milinda Pointer, MD [facility]  12/02/18 Procedure visit Milinda Pointer, MD [facility]  Showing recent visits within past 90 days and meeting all other requirements   Today's Visits Date Type Provider Dept  02/12/19 Office Visit Milinda Pointer, MD [facility]  Showing today's visits and meeting all other requirements   Future Appointments No visits were found meeting these conditions.  Showing future appointments within next 90 days and meeting all other requirements   I discussed the assessment and treatment plan with the patient. The patient was provided an opportunity to ask questions and all were answered. The patient agreed with the plan and demonstrated an understanding of the instructions.  Patient advised to call back or seek an in-person evaluation if the symptoms or condition worsens.  Total duration of non-face-to-face encounter: 20 minutes.  Note by: [name], MD Date: 02/12/2019; Time: 3:03 PM  Note: This dictation was prepared with Dragon dictation. Any transcriptional errors that may result from this process are unintentional.  Disclaimer:  * Given the special circumstances of the COVID-19 pandemic, the federal government has announced that the Office for Civil Rights (OCR) will exercise its enforcement discretion and will not impose penalties on physicians using telehealth in the event of noncompliance with regulatory requirements under the [place] (HIPAA) in connection with the good faith provision of telehealth during the QMVHQ-46 national public health emergency. ([place])

## 2019-02-12 NOTE — Patient Instructions (Signed)

## 2019-02-17 ENCOUNTER — Other Ambulatory Visit: Payer: Self-pay | Admitting: *Deleted

## 2019-02-17 DIAGNOSIS — G620 Drug-induced polyneuropathy: Secondary | ICD-10-CM

## 2019-02-17 DIAGNOSIS — C3491 Malignant neoplasm of unspecified part of right bronchus or lung: Secondary | ICD-10-CM

## 2019-02-17 DIAGNOSIS — T451X5A Adverse effect of antineoplastic and immunosuppressive drugs, initial encounter: Secondary | ICD-10-CM

## 2019-02-17 DIAGNOSIS — C3411 Malignant neoplasm of upper lobe, right bronchus or lung: Secondary | ICD-10-CM

## 2019-02-17 NOTE — Telephone Encounter (Signed)
Patient will call and schedule a virtual appointment asap and hopefully he can get it this week before he runs out of medication. He states he doe snot leave the house and had back surgery it why he cancelled his appointment.

## 2019-02-18 ENCOUNTER — Other Ambulatory Visit: Payer: Self-pay | Admitting: *Deleted

## 2019-02-18 DIAGNOSIS — C3491 Malignant neoplasm of unspecified part of right bronchus or lung: Secondary | ICD-10-CM

## 2019-02-18 DIAGNOSIS — G620 Drug-induced polyneuropathy: Secondary | ICD-10-CM

## 2019-02-18 DIAGNOSIS — C3411 Malignant neoplasm of upper lobe, right bronchus or lung: Secondary | ICD-10-CM

## 2019-02-18 MED ORDER — HYDROCODONE-ACETAMINOPHEN 5-325 MG PO TABS: 1.0000 | ORAL_TABLET | Freq: Three times a day (TID) | ORAL | 0 refills | Status: DC | PRN

## 2019-02-18 NOTE — Telephone Encounter (Signed)
Pt requesting RF for narcotic.

## 2019-02-18 NOTE — Telephone Encounter (Signed)
Called the narcotics; follow up virtual next week.  GB

## 2019-02-24 ENCOUNTER — Other Ambulatory Visit: Payer: Self-pay

## 2019-02-24 ENCOUNTER — Encounter: Payer: Self-pay | Admitting: Internal Medicine

## 2019-02-24 NOTE — Progress Notes (Signed)
Patient pre screened for office appointment, no questions or concerns today. 

## 2019-02-25 ENCOUNTER — Inpatient Hospital Stay: Payer: Medicare Other | Attending: Internal Medicine | Admitting: Internal Medicine

## 2019-02-25 DIAGNOSIS — C3411 Malignant neoplasm of upper lobe, right bronchus or lung: Secondary | ICD-10-CM | POA: Diagnosis not present

## 2019-02-25 NOTE — Assessment & Plan Note (Addendum)
#   Right UL POORLY DIFF CA- T3 N0  Currently status post radiation [finished feb 2017];  Finished adjuvant chemotherapy [end of April 2017] October 2019-  CT scan shows continued partial response; clinically stable RUL/ RLL- stable. RML- appx 37mm [see discussion below]  # RML/RLL-Slight growth over 2-3 years; recommend imaging to further evaluate the status of disease.  If worsening recommend SBRT as an option.  # Peripheral neuropathy- worse; increase hydrocodone q 8 hours prn. Continue Neurontin 300 TID [follows up with neurology, Dr.Shah.];  As Dr. Manuella Ghazi has been managing his neuropathy/also especially follows up with Dr. Dossie Arbour pain management-I think it is reasonable to have them manage his chronic pain.  We will talk to his doctors.  # COPD on O2/nasal cannula.  Overall stable.  # Bladder cancer- superficial- defer to Urology.   # DISPOSITION:  # follow up in 2 weeks- MD DoximityCT scan chest - [address] in 2 weeks; Dr.B

## 2019-02-25 NOTE — Progress Notes (Signed)
I connected with Joseph Graham on 02/25/19 at  1:00 PM EDT by video enabled telemedicine visit and verified that I am speaking with the correct person using two identifiers.  I discussed the limitations, risks, security and privacy concerns of performing an evaluation and management service by telemedicine and the availability of in-person appointments. I also discussed with the patient that there may be a patient responsible charge related to this service. The patient expressed understanding and agreed to proceed.    Other persons participating in the visit and their role in the encounter: RN/medical reconciliation Patient's location: Home  Provider's location: Office   Oncology History Overview Note   # OCT 2016- RUL POORLY DIFF CA [clinically non-small cell] ; STAGE IIB [T3-2 separate nodules in RUL; N0] [s/p CT guided bx]- s/p RT [finished mid jan 2017]; FEB 2017-CT scan- regression of RUL nodules;   # March 2017- Start carbo-taxol q 3 w x4; June 2017- CT- Improvement of RUL nodule/ Stable RUL nodules. OCT 2017- PR   # OCT 2016- 2 sub cm nodules in Right LL- Feb CT 2017- STABLE; June 2017- resolved  # May- 2017- G2 PN  # OCT 2016-NON-invasive bladder urothelial cancer; high grade [Dr. [name]; s/p TURBT [Feb 2017- NEG]; declined BCG  #   # Pneumothorax [oct 2016 s/p CT Bx];  # MOLECULAR STUDIES- PDL-1 by IHC/keytruda- 11-20%.  -----------------------------------------    DIAGNOSIS: RUL Lung cancer  STAGE:   II ; GOALS: control  CURRENT/MOST RECENT THERAPY: Surveillance      Malignant neoplasm of right upper lobe of lung ([place])  12/20/2015 Initial Diagnosis   Malignant neoplasm of right upper lobe of lung ([place])    Chief Complaint: Lung cancer   History of present illness:Joseph Graham 78 y.o.  male with history of above history of stage II lung cancer currently on surveillance; severe bilateral LE neuropathy; COPD on home O2 is here for follow-up.  Patient had  postponed his visits/CAT scan because of Covid pandemic.   Patient in the interim had back surgery approximately a month ago.  He is recovering fairly well.  However he noted to have worsening neuropathy in his bilateral lower extremities.  He is currently taking up to 3 Neurontin 3 mg a day.  He has also been needing to take hydrocodone up to 3 a day.  He has been evaluated by neurology; unfortunately told to expect continued worsening neuropathy.  Patient has chronic shortness of breath chronic cough.  No other new symptoms.   Observation/objective: none  Assessment and plan: Malignant neoplasm of right upper lobe of lung ([place]) # Right UL POORLY DIFF CA- T3 N0  Currently status post radiation [finished feb 2017];  Finished adjuvant chemotherapy [end of April 2017] October 2019-  CT scan shows continued partial response; clinically stable RUL/ RLL- stable. RML- appx 52m [see discussion below]  # RML/RLL-Slight growth over 2-3 years; recommend imaging to further evaluate the status of disease.  If worsening recommend SBRT as an option.  # Peripheral neuropathy- worse; increase hydrocodone q 8 hours prn. Continue Neurontin 300 TID [follows up with neurology, Dr.Shah.];  As Dr. SManuella Ghazihas been managing his neuropathy/also especially follows up with Dr. NDossie Arbourpain management-I think it is reasonable to have them manage his chronic pain.  We will talk to his doctors.  # COPD on O2/nasal cannula.  Overall stable.  # Bladder cancer- superficial- defer to Urology.   # DISPOSITION:  # follow up in 2 weeks- MD DoximityCT  scan chest - [address] in 2 weeks; Dr.B Follow-up instructions:  I discussed the assessment and treatment plan with the patient.  The patient was provided an opportunity to ask questions and all were answered.  The patient agreed with the plan and demonstrated understanding of instructions.  The patient was advised to call back or seek an in person evaluation if the symptoms  worsen or if the condition fails to improve as anticipated. I provided 25 minutes of face-to-face video visit time during this encounter, and > 50% was spent counseling as documented under my assessment & plan.  Dr. Charlaine Dalton [place] at [facility] 02/25/2019 8:38 PM

## 2019-03-09 ENCOUNTER — Other Ambulatory Visit: Payer: Self-pay

## 2019-03-09 ENCOUNTER — Ambulatory Visit
Admission: RE | Admit: 2019-03-09 | Discharge: 2019-03-09 | Disposition: A | Discharge status: Home / Self Care | Payer: Medicare Other | Source: Ambulatory Visit | Attending: Urology | Admitting: Urology

## 2019-03-09 DIAGNOSIS — C3490 Malignant neoplasm of unspecified part of unspecified bronchus or lung: Secondary | ICD-10-CM | POA: Diagnosis not present

## 2019-03-09 DIAGNOSIS — C679 Malignant neoplasm of bladder, unspecified: Secondary | ICD-10-CM | POA: Insufficient documentation

## 2019-03-09 DIAGNOSIS — C3411 Malignant neoplasm of upper lobe, right bronchus or lung: Secondary | ICD-10-CM | POA: Diagnosis not present

## 2019-03-09 LAB — POCT I-STAT CREATININE: Creatinine, Ser: 0.7 mg/dL (ref 0.61–1.24)

## 2019-03-09 MED ORDER — IOHEXOL 300 MG/ML  SOLN
125.0000 mL | Freq: Once | INTRAMUSCULAR | Status: AC | PRN
  Administered 2019-03-09: 10:00:00 125 mL via INTRAVENOUS

## 2019-03-10 ENCOUNTER — Other Ambulatory Visit: Payer: Self-pay

## 2019-03-10 ENCOUNTER — Telehealth: Payer: Self-pay

## 2019-03-10 NOTE — Telephone Encounter (Signed)
My chart notifcation sent

## 2019-03-10 NOTE — Telephone Encounter (Signed)
-----   Message from Billey Co, MD sent at 03/10/2019  8:16 AM EDT ----- No recurrence of bladder cancer on his CT imaging.  Please schedule for next available cystoscopy for routine bladder cancer surveillance  Nickolas Madrid, MD 03/10/2019

## 2019-03-10 NOTE — Telephone Encounter (Signed)
I called pt to schedule Cysto and he states that he will call us after Thanksgiving to schedule appt.

## 2019-03-11 ENCOUNTER — Inpatient Hospital Stay (HOSPITAL_BASED_OUTPATIENT_CLINIC_OR_DEPARTMENT_OTHER): Payer: Medicare Other | Admitting: Internal Medicine

## 2019-03-11 DIAGNOSIS — C3411 Malignant neoplasm of upper lobe, right bronchus or lung: Secondary | ICD-10-CM | POA: Diagnosis not present

## 2019-03-11 NOTE — Assessment & Plan Note (Addendum)
#   Right UL POORLY DIFF CA- T3 N0  Currently status post radiation [finished feb 2017];  Finished adjuvant chemotherapy [end of April 2017]  #March 09, 2019 CT scan shows continued stability of the right upper lobe nodule/dominant lesion.   # RML/RLL-Slight growth over 4 years-currently approximately 1 x 1.4 cm.  Discussed regarding his evaluation with radiation/SBRT.  Patient is concerned about potential side effects including worsening breathing/S patient the context of his multiple medical problems including COPD/neuropathy/recent back surgery etc.  Patient wants to hold off evaluation-and prefers to have a repeat imaging in 12 months.  # Peripheral neuropathy- worse; increase hydrocodone q 8 hours prn. Continue Neurontin 300 TID [follows up with neurology, Dr.Shah.];  As Dr. Manuella Ghazi has been managing his neuropathy/also especially follows up with Dr. Dossie Arbour pain management-I think it is reasonable to have them manage his chronic pain.  Patient waiting to talk to his neurologist/pain management doctor.  # COPD on O2/nasal cannula.  Stable  #Back pain status post surgery stable.  # Bladder cancer- superficial-CT abdomen pelvis no evidence of any bladder lesions.  Continue follow-up with urology.  # DISPOSITION:  # follow up in 12 months- MD-labs-cbc/cmp;CT chest prior Dr.B  Cc- Dr.Shah/Dr.N

## 2019-03-11 NOTE — Progress Notes (Signed)
I connected with [name] on 03/11/19 at  1:30 PM EDT by video enabled telemedicine visit and verified that I am speaking with the correct person using two identifiers.  I discussed the limitations, risks, security and privacy concerns of performing an evaluation and management service by telemedicine and the availability of in-person appointments. I also discussed with the patient that there may be a patient responsible charge related to this service. The patient expressed understanding and agreed to proceed.    Other persons participating in the visit and their role in the encounter: RN/medical reconciliation Patient's location: Home Provider's location: Office  Oncology History Overview Note   # OCT 2016- RUL POORLY DIFF CA [clinically non-small cell] ; STAGE IIB [T3-2 separate nodules in RUL; N0] [s/p CT guided bx]- s/p RT [finished mid jan 2017]; FEB 2017-CT scan- regression of RUL nodules;   # March 2017- Start carbo-taxol q 3 w x4; June 2017- CT- Improvement of RUL nodule/ Stable RUL nodules. OCT 2017- PR   # OCT 2016- 2 sub cm nodules in Right LL- Feb CT 2017- STABLE; June 2017- resolved  # May- 2017- G2 PN  # OCT 2016-NON-invasive bladder urothelial cancer; high grade [Dr. [name]; s/p TURBT [Feb 2017- NEG]; declined BCG  #   # Pneumothorax [oct 2016 s/p CT Bx];  # MOLECULAR STUDIES- PDL-1 by IHC/keytruda- 11-20%.  -----------------------------------------    DIAGNOSIS: RUL Lung cancer  STAGE:   II ; GOALS: control  CURRENT/MOST RECENT THERAPY: Surveillance      Malignant neoplasm of right upper lobe of lung ([place])  12/20/2015 Initial Diagnosis   Malignant neoplasm of right upper lobe of lung ([place])      Chief Complaint: Lung cancer   History of present illness:Joseph Graham 78 y.o.  male with history of above history of lung cancer currently surveillance is here for follow-up.  Patient is currently overwhelmed with his multiple medical problems.  He is  recently had back surgery/recovering.  Continues to have chronic tingling and numbness in extremities/and pain secondary neuropathy.  Chronic shortness of breath chronic cough.  Continues with oxygen.  Observation/objective:  Assessment and plan: Malignant neoplasm of right upper lobe of lung ([place]) # Right UL POORLY DIFF CA- T3 N0  Currently status post radiation [finished feb 2017];  Finished adjuvant chemotherapy [end of April 2017]  #March 09, 2019 CT scan shows continued stability of the right upper lobe nodule/dominant lesion.   # RML/RLL-Slight growth over 4 years-currently approximately 1 x 1.4 cm.  Discussed regarding his evaluation with radiation/SBRT.  Patient is concerned about potential side effects including worsening breathing/S patient the context of his multiple medical problems including COPD/neuropathy/recent back surgery etc.  Patient wants to hold off evaluation-and prefers to have a repeat imaging in 12 months.  # Peripheral neuropathy- worse; increase hydrocodone q 8 hours prn. Continue Neurontin 300 TID [follows up with neurology, Dr.Shah.];  As Dr. Manuella Ghazi has been managing his neuropathy/also especially follows up with Dr. Dossie Arbour pain management-I think it is reasonable to have them manage his chronic pain.  Patient waiting to talk to his neurologist/pain management doctor.  # COPD on O2/nasal cannula.  Stable  #Back pain status post surgery stable.  # Bladder cancer- superficial-CT abdomen pelvis no evidence of any bladder lesions.  Continue follow-up with urology.  # DISPOSITION:  # follow up in 12 months- MD-labs-cbc/cmp;CT chest prior Dr.B  Cc- Dr.Shah/Dr.N  Follow-up instructions:  I discussed the assessment and treatment plan with the patient.  The patient was provided an opportunity to ask questions and all were answered.  The patient agreed with the plan and demonstrated understanding of instructions.  The patient was advised to call back or seek an  in person evaluation if the symptoms worsen or if the condition fails to improve as anticipated.   Dr. Charlaine Dalton [place] at [facility] 03/11/2019 4:14 PM

## 2019-03-17 NOTE — Telephone Encounter (Signed)
-----   Message from Billey Co, MD sent at 03/10/2019  8:16 AM EDT ----- No recurrence of bladder cancer on his CT imaging.  Please schedule for next available cystoscopy for routine bladder cancer surveillance  Nickolas Madrid, MD 03/10/2019

## 2019-03-17 NOTE — Telephone Encounter (Signed)
Called patient to schedule his cysto and he said he wanted to wait until after Thanksgiving and that he would call the office back to schedule.  Sharyn Lull

## 2019-04-03 ENCOUNTER — Other Ambulatory Visit: Payer: Self-pay | Admitting: *Deleted

## 2019-04-03 DIAGNOSIS — C3411 Malignant neoplasm of upper lobe, right bronchus or lung: Secondary | ICD-10-CM

## 2019-04-03 DIAGNOSIS — T451X5A Adverse effect of antineoplastic and immunosuppressive drugs, initial encounter: Secondary | ICD-10-CM

## 2019-04-03 DIAGNOSIS — C3491 Malignant neoplasm of unspecified part of right bronchus or lung: Secondary | ICD-10-CM

## 2019-04-03 DIAGNOSIS — G620 Drug-induced polyneuropathy: Secondary | ICD-10-CM

## 2019-04-05 ENCOUNTER — Encounter: Payer: Self-pay | Admitting: Pain Medicine

## 2019-04-05 ENCOUNTER — Encounter: Payer: Self-pay | Admitting: Internal Medicine

## 2019-04-06 ENCOUNTER — Other Ambulatory Visit: Payer: Self-pay

## 2019-04-06 ENCOUNTER — Encounter: Payer: Self-pay | Admitting: Pain Medicine

## 2019-04-06 ENCOUNTER — Ambulatory Visit: Payer: Medicare Other | Attending: Pain Medicine | Admitting: Pain Medicine

## 2019-04-06 DIAGNOSIS — G894 Chronic pain syndrome: Secondary | ICD-10-CM

## 2019-04-06 DIAGNOSIS — M79604 Pain in right leg: Secondary | ICD-10-CM | POA: Diagnosis not present

## 2019-04-06 DIAGNOSIS — G8929 Other chronic pain: Secondary | ICD-10-CM

## 2019-04-06 DIAGNOSIS — M5137 Other intervertebral disc degeneration, lumbosacral region: Secondary | ICD-10-CM

## 2019-04-06 DIAGNOSIS — M545 Low back pain, unspecified: Secondary | ICD-10-CM

## 2019-04-06 DIAGNOSIS — M533 Sacrococcygeal disorders, not elsewhere classified: Secondary | ICD-10-CM | POA: Diagnosis not present

## 2019-04-06 DIAGNOSIS — M79605 Pain in left leg: Secondary | ICD-10-CM

## 2019-04-06 DIAGNOSIS — Q7649 Other congenital malformations of spine, not associated with scoliosis: Secondary | ICD-10-CM

## 2019-04-06 MED ORDER — HYDROCODONE-ACETAMINOPHEN 5-325 MG PO TABS: 1.0000 | ORAL_TABLET | Freq: Three times a day (TID) | ORAL | 0 refills | Status: DC | PRN

## 2019-04-06 NOTE — Progress Notes (Signed)
Pain Management Virtual Encounter Note - Virtual Visit via Telephone Telehealth (real-time audio visits between healthcare provider and patient).   Patient's Phone No. & Preferred Pharmacy:  [phone] (home); [phone] (mobile); (Preferred) [phone] [email or web address]  [id number] - [place], [place] [address] [place] [address] Phone: [phone] Fax: [phone]    Pre-screening note:  Our staff contacted Mr. Accomando and offered him an "in person", "face-to-face" appointment versus a telephone encounter. He indicated preferring the telephone encounter, at this time.   Reason for Virtual Visit: COVID-19*  Social distancing based on CDC and AMA recommendations.   I contacted [name] on 04/06/2019 via telephone.      I clearly identified myself as [name], MD. I verified that I was speaking with the correct person using two identifiers (Name: MAKHAI FULCO, and date of birth: 1940-07-05).  Advanced Informed Consent I sought verbal advanced consent from [name] for virtual visit interactions. I informed Mr. Morelos of possible security and privacy concerns, risks, and limitations associated with providing "not-in-person" medical evaluation and management services. I also informed Mr. Hingle of the availability of "in-person" appointments. Finally, I informed him that there would be a charge for the virtual visit and that he could be  personally, fully or partially, financially responsible for it. Mr. Hillhouse expressed understanding and agreed to proceed.   Historic Elements   Mr. JARMAL LEWELLING is a 78 y.o. year old, male patient evaluated today after his last encounter by our practice on 02/12/2019. Mr. Shartzer  has a past medical history of Alcohol abuse, in remission, Arthritis, Benign essential tremor, BPH (benign prostatic hypertrophy) (12/30/2012),  Cardiac arrhythmia (03/2010), Cataract, Chemotherapy-induced neutropenia ([place]) (09/23/2015), Chronic low back pain, Colon polyps, Complication of anesthesia (age [age]), COPD (chronic obstructive pulmonary disease) ([place]), Coronary artery disease, Dyslipidemia, GERD (gastroesophageal reflux disease), History of kidney stones, HTN (hypertension), Macular degeneration, bilateral, Multiple pulmonary nodules (02/2015), Neuromuscular disorder ([place]), Peripheral neuropathy due to chemotherapy ([place]) (12/23/2015), Personal history of tobacco use, presenting hazards to health (02/18/2015), Pneumothorax after biopsy (04/01/2015), Pre-diabetes, Primary cancer of bladder ([place]) (2016), Primary lung cancer ([place]) (2016), and Shortness of breath dyspnea. He also  has a past surgical history that includes Hemorroidectomy (1982); A flutter ablation (03/2010); Cataract extraction; carotid US (03/2010); Colonoscopy (09/2011); Tonsillectomy (1964); Electormagnetic navigation bronchoscopy (N/A, 03/02/2015); Transurethral resection of bladder tumor (N/A, 04/06/2015); Cystoscopy w/ retrogrades (Bilateral, 04/06/2015); urinary stent removal (04/14/15); Transurethral resection of bladder tumor (N/A, 07/13/2015); Portacath placement (N/A, 08/10/2015); Port-a-cath removal (Right, 04/03/2016); Polypectomy; Lung biopsy; and Hemi-microdiscectomy lumbar laminectomy level 1 (N/A, 01/26/2019). Mr. Rippeon has a current medication list which includes the following prescription(s): acetaminophen, albuterol, vitamin d-3, gabapentin, hydrocodone-acetaminophen, lisinopril, metoprolol tartrate, multivitamin with minerals, NON FORMULARY, omeprazole, oxygen-helium, polyethylene glycol, primidone, vitamin b-12, and vitamin e. He  reports that he quit smoking about 4 years ago. His smoking use included cigarettes. He has a 91.50 pack-year smoking history. He has never used smokeless tobacco. He reports that he does not drink alcohol or use drugs. Mr. Collister has No Known  Allergies.   HPI  Today, he is being contacted for medication management.  Today I contacted the patient as a request from him to talk about the possibility of taking over his pain medication.  Unfortunately, this led to an incredibly long conversation regarding his opinion as to our medication policy and procedures.  He indicated that he  does not like to come in to have his pills counted and his use to his oncologist writing for his hydrocodone in a certain way.  He indicated having had a history of alcoholism and therefore being very concerned about how he takes his pills.  Today I explained to him what our protocol is and I try to explain to him that this is something that applies to everyone that we prescribe medications to, but it is my feeling that this is an inconvenience for him and he apparently prefers for somebody to call in his prescription on a monthly basis without having to question him more have him do any type of lab work.  I was clear that I did not have any problems with prescribing the medication, but I just want him to be very clear of the policies and procedures that we use here for the controlled substances.  He indicated that his current physician does things very differently where he does not do any lab work or counts any pills.  I told him that I understood and that I heard him, but the same time I needed to let him know that I am not willing to change our monitoring of controlled substances since everything that we do is geared towards keeping this patient safe and I was not willing to compromise that safety.  Eventually we moved out of that topic and he indicated that the left L5 transverse process removal done on 01/26/2019 did help with some of his pain, but it did not eliminate all of them.  I told him that I was well aware of that since he has different types of pain coming from different problems and that I did not expect that surgery to be the answer of all of his problems.  He  currently has Dr. Manuella Ghazi prescribing for his neuropathy pain and he indicated that he will be asking him if he would be willing to take over his hydrocodone.  Once again, I told him that this was his choice and his choice only.  Pharmacotherapy Assessment  Analgesic: No opioid analgesics prescribed by our practice. Hydrocodone/APAP 5/325 mg, 1 tab PO q8 hrs (15 mg/day of hydrocodone) (Last prescribed on 02/18/2019, by Cammie Sickle, MD & Verlon Au, NP. MME/day:15mg /day.   Monitoring: Pharmacotherapy: No side-effects or adverse reactions reported. [place] PMP: PDMP reviewed during this encounter.       Compliance: No problems identified. Effectiveness: Clinically acceptable. Plan: Refer to "POC".  UDS:  Summary  Date Value Ref Range Status  07/14/2018 FINAL  Final    Comment:    ==================================================================== TOXAS[facility]RE COMP DRUG ANALYSIS,UR ==================================================================== Test                             Result       Flag       Units Drug Present and Declared for Prescription Verification   Hydrocodone                    185          EXPECTED   ng/mg creat   Hydromorphone                  207          EXPECTED   ng/mg creat   Norhydrocodone                 563  EXPECTED   ng/mg creat    Sources of hydrocodone include scheduled prescription    medications. Hydromorphone and norhydrocodone are expected    metabolites of hydrocodone. Hydromorphone is also available as a    scheduled prescription medication.   Primidone                      PRESENT      EXPECTED   Phenobarbital                  PRESENT      EXPECTED    Phenobarbital is an expected metabolite of primidone;    Phenobarbital may also be administered as a prescription drug.   Gabapentin                     PRESENT      EXPECTED   Acetaminophen                  PRESENT      EXPECTED Drug Absent but Declared for Prescription  Verification   Metoprolol                     Not Detected UNEXPECTED ==================================================================== Test                      Result    Flag   Units      Ref Range   Creatinine              96               mg/dL      >=20 ==================================================================== Declared Medications:  The flagging and interpretation on this report are based on the  following declared medications.  Unexpected results may arise from  inaccuracies in the declared medications.  **Note: The testing scope of this panel includes these medications:  Gabapentin  Hydrocodone (Hydrocodone-Acetaminophen)  Metoprolol (Lopressor)  Primidone (Mysoline)  **Note: The testing scope of this panel does not include small to  moderate amounts of these reported medications:  Acetaminophen  Acetaminophen (Hydrocodone-Acetaminophen)  **Note: The testing scope of this panel does not include following  reported medications:  Cannabidiol  Cholecalciferol  Cyanocobalamin  Lisinopril  Multivitamin  Omeprazole (Prilosec)  Polyethylene Glycol  Vitamin E ==================================================================== For clinical consultation, please call [phone]. ====================================================================    Laboratory Chemistry Profile (12 mo)  Renal: 07/14/2018: BUN/Creatinine Ratio 35 01/19/2019: BUN 28 03/09/2019: Creatinine, Ser 0.70  Lab Results  Component Value Date   GFR 105.58 07/04/2018   GFRAA >60 01/19/2019   GFRNONAA >60 01/19/2019   Hepatic: 07/14/2018: Albumin 4.6 Lab Results  Component Value Date   AST 19 07/14/2018   ALT 19 07/04/2018   Other: 07/14/2018: 25-Hydroxy, Vitamin D 30; 25-Hydroxy, Vitamin D-2 <1.0; 25-Hydroxy, Vitamin D-3 30; CRP 3; Sed Rate 51; Vitamin B-12 735 Note: Above Lab results reviewed.  Imaging  Last 90 days:  Dg Lumbar Spine 2-3 Views  Result Date: 01/26/2019 CLINICAL  DATA:  Left L5-S1 resection of transverse process. EXAM: DG C-ARM 61-120 MIN; LUMBAR SPINE - 2-3 VIEW Radiation exposure index: 7.7033 mGy. COMPARISON:  CT scan of January 22, 2019. FINDINGS: Two intraoperative fluoroscopic images were obtained of the lower lumbar spine. These demonstrate surgical localization of posterior elements of L5 as well as the posterior portion of L5-S1 disc space. IMPRESSION: Fluoroscopic guidance provided during lower lumbar surgery. Electronically Signed   By: Marijo Conception  M.D.   On: 01/26/2019 10:07   Ct Chest W Contrast  Result Date: 03/09/2019 CLINICAL DATA:  Lung cancer restaging. Home oxygen. Bladder cancer restaging. EXAM: CT CHEST WITH CONTRAST CT ABDOMEN AND PELVIS WITH AND WITHOUT CONTRAST TECHNIQUE: Multidetector CT imaging of the chest was performed during intravenous contrast administration. Multidetector CT imaging of the abdomen and pelvis was performed following the standard protocol before and during bolus administration of intravenous contrast. CONTRAST:  145mL OMNIPAQUE IOHEXOL 300 MG/ML  SOLN COMPARISON:  Multiple exams, including 03/19/2018 chest CT, and PET-CT of 03/01/2015 FINDINGS: CT CHEST FINDINGS Cardiovascular: Coronary, aortic arch, and branch vessel atherosclerotic vascular disease. Aberrant right subclavian artery passes behind the esophagus. Mediastinum/Nodes: Stable thyroid nodules. Right hilar node 0.8 cm in short axis, stable. Lungs/Pleura: Right apical bandlike densities converging on a 1.9 by 1.2 cm right upper lobe nodule on image 30/5, essentially stable from prior. Airway thickening is present, suggesting bronchitis or reactive airways disease. A right middle lobe nodule measuring 0.9 by 0.7 cm on image 83/5 previously measured 1.0 by 0.7 cm by my measurements, essentially stable. There is some subtle nodularity inferiorly in the right upper lobe including a 3 mm nodule on image 76/5 which appears to be new. A 1.4 by 1.0 cm right lower lobe  nodule on image 94/5 previously measured 1.2 by 1.0 cm on 03/19/2018 by my measurements. Back on 02/18/2015 this measured 0.9 by 0.5 cm. Stable subpleural ground-glass density nodule in the superior segment right lower lobe measuring 0.7 by 0.6 cm on image 74/5. Centrilobular emphysema. Musculoskeletal: Unremarkable CT ABDOMEN AND PELVIS FINDINGS Hepatobiliary: 1.1 cm hypodense lesion in the dome of the right hepatic lobe on image 118/4 appears stable from 03/01/2015 and is most likely a cyst or similar benign lesion. Small focus of porta hepatic shunting along the falciform ligament on image 153/4. Hypodense lesion favoring cyst measuring 0.6 cm in segment 5 of the liver on image 163/4. A similar lesion in left hepatic lobe measures 0.7 by 0.4 cm on image 12/10 Cholelithiasis is present.  No biliary dilatation. Pancreas: Unremarkable Spleen: Unremarkable Adrenals/Urinary Tract: Both adrenal glands appear normal. 0.9 by 0.7 cm hypodense lesion in the left mid kidney anteriorly on image 30/10, likely a cyst technically too small to characterize. 1.3 by 1.3 cm simple appearing cyst in the right kidney lower pole on image 34/10. Nondistended urinary bladder. The prostate gland indents the bladder base. Delayed images were obtained patient in the left side down lateral decubitus position, with a contrast fluid level in the urinary bladder from poor mixing. I do not see a well-defined filling defect along the urothelium; portions of the right proximal ureter did not fill well on the delayed images. Stomach/Bowel: Sigmoid colon diverticulosis. Vascular/Lymphatic: Aortoiliac atherosclerotic vascular disease. There is some chronic appearing mural thrombus proximally in the right common iliac artery. 1.0 cm right external iliac node on image 65/10, stable in size and not previously hypermetabolic, hence likely benign. Reproductive: Mild prostatomegaly. Other: No supplemental non-categorized findings. Musculoskeletal: Lumbar  spondylosis and degenerative disc disease with mild bilateral foraminal impingement at the L4-5 level. Transitional L5 vertebra. Fatty right spermatic cord. IMPRESSION: 1. The dominant right upper lobe nodule is stable in size, and most of the other right-sided pulmonary nodules are roughly stable compared to the most recent exam of 03/19/2018, but have slowly increased in size over the last several years, raising concern for low-grade adenocarcinoma. For example, the current 1.4 by 1.0 cm right lower lobe nodule on image  94/5 previously measured 0.9 by 0.5 cm back on 02/18/2015. 2. No current findings of recurrent transitional cell carcinoma. 3. Other imaging findings of potential clinical significance: Aortic Atherosclerosis (ICD10-I70.0). Coronary atherosclerosis. Aberrant right subclavian artery. Airway thickening is present, suggesting bronchitis or reactive airways disease. Cholelithiasis. Mild prostatomegaly. Sigmoid colon diverticulosis. Mild bilateral foraminal impingement at L4-5. Emphysema (ICD10-J43.9). Electronically Signed   By: Van Clines M.D.   On: 03/09/2019 11:24   Ct Lumbar Spine Wo Contrast  Result Date: 01/22/2019 CLINICAL DATA:  Low back pain.  Preoperative planning scan. EXAM: CT LUMBAR SPINE WITHOUT CONTRAST TECHNIQUE: Multidetector CT imaging of the lumbar spine was performed without intravenous contrast administration. Multiplanar CT image reconstructions were also generated. COMPARISON:  MRI 06/12/2018.  Radiography 07/21/2018 FINDINGS: Segmentation: 5 lumbar type vertebral bodies. L5 is transitional as described below. Alignment: Mild curvature convex to the left. Straightening of the normal lumbar lordosis. Vertebrae: No fracture.  No primary bone lesion. Paraspinal and other soft tissues: Negative except for aortic atherosclerosis. Disc levels: No disc abnormality at T11-12, T12-L1 or L1-2. No stenosis. L2-3: Disc space narrowing with endplate osteophytes. Mild narrowing of  the right lateral recess but no definite neural compression. L3-4: Disc degeneration with loss of disc height and vacuum phenomenon. Endplate osteophytes. Mild narrowing of both lateral recesses but no visible neural compression. L4-5: Chronic disc degeneration with loss of disc height. There appears to be solid fusion across the disc level. Prominent posterior osteophytes projecting slightly more towards the right and extending down behind the superior portion of L5. These encroach upon the spinal canal and cause lateral recess stenosis, worse on the right than on the left. L5-S1: Transitional level. Endplate osteophytes and bulging of the disc more prominent towards the left. No central canal stenosis. Mild encroachment upon the subarticular lateral recess on the left with some potential to affect the left S1 nerve. Bridging osteophytes anteriorly there probably solid. 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 Mild irregularity of the tip of the right transverse process probably relates to ligamentous stress. Both sacroiliac joints show mild osteoarthritis. IMPRESSION: L2-3: Disc degeneration with endplate osteophytes more prominent towards the right. Right lateral recess narrowing but no definite neural compression. L3-4: Disc degeneration with endplate osteophytes. Mild bilateral lateral recess narrowing but no definite neural compression. L4-5: Fusion across the disc space. Bone encroaches upon the spinal canal and proximal foramen on the right. Narrowing of the right lateral recess could possibly affect the right L5 nerve. The L4 nerve passes adjacent to the fusion bone but does not appear to be compressed. L5-S1: Transitional level. 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 Mild irregularity of the tip of the prominent transverse process on the right, probably secondary to ligamentous  stress. Osteophytes resulting in mild narrowing of the subarticular lateral recess on the left which could possibly affect S1 nerve. It does appear that there is probably solid bridging osteophyte formation at this level. Electronically Signed   By: Nelson Chimes M.D.   On: 01/22/2019 19:46   Dg Pain Clinic C-arm 1-60 Min No Report  Result Date: 01/15/2019 Fluoro was used, but no Radiologist interpretation will be provided. Please refer to "NOTES" tab for provider progress note.  Dg C-arm 1-60 Min  Result Date: 01/26/2019 CLINICAL DATA:  Left L5-S1 resection of transverse process. EXAM: DG C-ARM 61-120 MIN; LUMBAR SPINE - 2-3 VIEW Radiation exposure index: 7.7033 mGy. COMPARISON:  CT  scan of January 22, 2019. FINDINGS: Two intraoperative fluoroscopic images were obtained of the lower lumbar spine. These demonstrate surgical localization of posterior elements of L5 as well as the posterior portion of L5-S1 disc space. IMPRESSION: Fluoroscopic guidance provided during lower lumbar surgery. Electronically Signed   By: Marijo Conception M.D.   On: 01/26/2019 10:07   Ct Hematuria Workup  Result Date: 03/09/2019 CLINICAL DATA:  Lung cancer restaging. Home oxygen. Bladder cancer restaging. EXAM: CT CHEST WITH CONTRAST CT ABDOMEN AND PELVIS WITH AND WITHOUT CONTRAST TECHNIQUE: Multidetector CT imaging of the chest was performed during intravenous contrast administration. Multidetector CT imaging of the abdomen and pelvis was performed following the standard protocol before and during bolus administration of intravenous contrast. CONTRAST:  174mL OMNIPAQUE IOHEXOL 300 MG/ML  SOLN COMPARISON:  Multiple exams, including 03/19/2018 chest CT, and PET-CT of 03/01/2015 FINDINGS: CT CHEST FINDINGS Cardiovascular: Coronary, aortic arch, and branch vessel atherosclerotic vascular disease. Aberrant right subclavian artery passes behind the esophagus. Mediastinum/Nodes: Stable thyroid nodules. Right hilar node 0.8 cm in short  axis, stable. Lungs/Pleura: Right apical bandlike densities converging on a 1.9 by 1.2 cm right upper lobe nodule on image 30/5, essentially stable from prior. Airway thickening is present, suggesting bronchitis or reactive airways disease. A right middle lobe nodule measuring 0.9 by 0.7 cm on image 83/5 previously measured 1.0 by 0.7 cm by my measurements, essentially stable. There is some subtle nodularity inferiorly in the right upper lobe including a 3 mm nodule on image 76/5 which appears to be new. A 1.4 by 1.0 cm right lower lobe nodule on image 94/5 previously measured 1.2 by 1.0 cm on 03/19/2018 by my measurements. Back on 02/18/2015 this measured 0.9 by 0.5 cm. Stable subpleural ground-glass density nodule in the superior segment right lower lobe measuring 0.7 by 0.6 cm on image 74/5. Centrilobular emphysema. Musculoskeletal: Unremarkable CT ABDOMEN AND PELVIS FINDINGS Hepatobiliary: 1.1 cm hypodense lesion in the dome of the right hepatic lobe on image 118/4 appears stable from 03/01/2015 and is most likely a cyst or similar benign lesion. Small focus of porta hepatic shunting along the falciform ligament on image 153/4. Hypodense lesion favoring cyst measuring 0.6 cm in segment 5 of the liver on image 163/4. A similar lesion in left hepatic lobe measures 0.7 by 0.4 cm on image 12/10 Cholelithiasis is present.  No biliary dilatation. Pancreas: Unremarkable Spleen: Unremarkable Adrenals/Urinary Tract: Both adrenal glands appear normal. 0.9 by 0.7 cm hypodense lesion in the left mid kidney anteriorly on image 30/10, likely a cyst technically too small to characterize. 1.3 by 1.3 cm simple appearing cyst in the right kidney lower pole on image 34/10. Nondistended urinary bladder. The prostate gland indents the bladder base. Delayed images were obtained patient in the left side down lateral decubitus position, with a contrast fluid level in the urinary bladder from poor mixing. I do not see a well-defined  filling defect along the urothelium; portions of the right proximal ureter did not fill well on the delayed images. Stomach/Bowel: Sigmoid colon diverticulosis. Vascular/Lymphatic: Aortoiliac atherosclerotic vascular disease. There is some chronic appearing mural thrombus proximally in the right common iliac artery. 1.0 cm right external iliac node on image 65/10, stable in size and not previously hypermetabolic, hence likely benign. Reproductive: Mild prostatomegaly. Other: No supplemental non-categorized findings. Musculoskeletal: Lumbar spondylosis and degenerative disc disease with mild bilateral foraminal impingement at the L4-5 level. Transitional L5 vertebra. Fatty right spermatic cord. IMPRESSION: 1. The dominant right upper lobe nodule is  stable in size, and most of the other right-sided pulmonary nodules are roughly stable compared to the most recent exam of 03/19/2018, but have slowly increased in size over the last several years, raising concern for low-grade adenocarcinoma. For example, the current 1.4 by 1.0 cm right lower lobe nodule on image 94/5 previously measured 0.9 by 0.5 cm back on 02/18/2015. 2. No current findings of recurrent transitional cell carcinoma. 3. Other imaging findings of potential clinical significance: Aortic Atherosclerosis (ICD10-I70.0). Coronary atherosclerosis. Aberrant right subclavian artery. Airway thickening is present, suggesting bronchitis or reactive airways disease. Cholelithiasis. Mild prostatomegaly. Sigmoid colon diverticulosis. Mild bilateral foraminal impingement at L4-5. Emphysema (ICD10-J43.9). Electronically Signed   By: Van Clines M.D.   On: 03/09/2019 11:24    Assessment  The primary encounter diagnosis was Chronic pain syndrome. Diagnoses of Bertolotti's syndrome (Left), Chronic low back pain (Primary area of Pain) (Bilateral)  (L>R) w/o sciatica, Chronic coccyx pain (Secondary Area of Pain) (Midline), Chronic lower extremity pain (Tertiary  Area of Pain) (Bilateral) (R>L), Coccygodynia, and DDD (degenerative disc disease), lumbosacral were also pertinent to this visit.  Plan of Care  I have discontinued Zedrick L. Bainter "Jim"'s tiZANidine. I am also having him maintain his omeprazole, polyethylene glycol, NON FORMULARY, gabapentin, lisinopril, metoprolol tartrate, albuterol, primidone, vitamin E, vitamin B-12, acetaminophen, multivitamin with minerals, Vitamin D-3, OXYGEN, and HYDROcodone-acetaminophen.  Pharmacotherapy (Medications Ordered): No orders of the defined types were placed in this encounter.  Orders:  Orders Placed This Encounter  Procedures  . Caudal Epidural Injection    Standing Status:   Future    Standing Expiration Date:   05/07/2019    Scheduling Instructions:     Laterality: Midline     Level(s): Sacrococcygeal canal (Tailbone area)     Sedation: Patient's choice     Scheduling Timeframe: As soon as pre-approved    Order Specific Question:   Where will this procedure be performed?    Answer:   [facility]  . Caudal Epidural Injection    Standing Status:   Standing    Number of Occurrences:   4    Standing Expiration Date:   04/05/2020    Scheduling Instructions:     Laterality: Midline     Level(s): Sacrococcygeal canal (Tailbone area)     Sedation: Patient's choice      TIMEFRAME: PRN procedure. (Mr. Quebedeaux will call when needed.)    Order Specific Question:   Where will this procedure be performed?    Answer:   [facility]   Follow-up plan:   Return for Procedure (w/ sedation): (ML) Caudal ESI #3.      Interventional management options: Considering: Possible Racz procedure Diagnostic coccygeal injection DiagnosticrightL4-5 LESI Diagnostic right L3-4 LESI Diagnostic right L2 TFESI Diagnostic right L3 TFESI Diagnostic right L4 TFESI DiagnosticbilateralLSB Possible bilateral lumbar facetRFA Diagnostic bilateral S-Iblock Possible bilateral S-I  jointRFA   Palliative PRN treatment(s): Palliative midline caudal ESIs #3 (he typically gets 100% relief of the pain for about 3 months) Palliative bilateral lumbar facet blocks #2 (100/100/90/90) (helped w/ LE spasms & LBP)  Diagnosticleft L5 transverse process and sacrumpseudoarticulationinjection #2     Recent Visits Date Type Provider Dept  02/12/19 Office Visit Milinda Pointer, MD [facility]  01/15/19 Procedure visit Milinda Pointer, MD [facility]  Showing recent visits within past 90 days and meeting all other requirements   Today's Visits Date Type Provider Dept  04/06/19 Office Visit Milinda Pointer, MD [facility]  Showing today's visits and meeting all other requirements   Future Appointments No visits were found meeting these conditions.  Showing future appointments within next 90 days and meeting all other requirements   I discussed the assessment and treatment plan with the patient. The patient was provided an opportunity to ask questions and all were answered. The patient agreed with the plan and demonstrated an understanding of the instructions.  Patient advised to call back or seek an in-person evaluation if the symptoms or condition worsens.  Total duration of non-face-to-face encounter: 25 minutes.  Note by: [name], MD Date: 04/06/2019; Time: 1:04 PM  Note: This dictation was prepared with Dragon dictation. Any transcriptional errors that may result from this process are unintentional.  Disclaimer:  * Given the special circumstances of the COVID-19 pandemic, the federal government has announced that the Office for Civil Rights (OCR) will exercise its enforcement discretion and will not impose penalties on physicians using telehealth in the event of noncompliance with regulatory requirements under the [place] and [place] (HIPAA) in connection with the good faith provision  of telehealth during the KVQQV-95 national public health emergency. ([place])

## 2019-04-06 NOTE — Patient Instructions (Signed)

## 2019-04-06 NOTE — Telephone Encounter (Signed)
Patient called cancer center requesting refill of Norco 5-325 mg tablets every 8 hours as needed for pain.  As mandated by the [place] STOP Act (Strengthen Opioid Misuse Prevention), the [place] Controlled Substance Reporting System ([place]) was reviewed for this patient. Below is the past 13-months of controlled substance prescriptions as displayed by the registry. I have personally consulted with my supervising physician, Dr. Rogue Bussing, who agrees that continuation of opiate therapy is medically appropriate at this time and agrees to provide continual monitoring, including urine/blood drug screens, as indicated. Refill is appropriate on or after 03/20/2019.  NCCSRS reviewed: Reviewed and okay to refill.    Faythe Casa, NP 04/06/2019 9:56 AM [phone]

## 2019-04-28 ENCOUNTER — Encounter: Payer: Self-pay | Admitting: Internal Medicine

## 2019-04-28 ENCOUNTER — Telehealth: Payer: Self-pay | Admitting: Internal Medicine

## 2019-04-28 ENCOUNTER — Encounter: Payer: Self-pay | Admitting: Family Medicine

## 2019-04-28 DIAGNOSIS — C3411 Malignant neoplasm of upper lobe, right bronchus or lung: Secondary | ICD-10-CM

## 2019-04-28 NOTE — Telephone Encounter (Signed)
Spoke to patient regarding continued narcotic prescription for peripheral neuropathy.  Also recommend once a month prescription 90 pills.  Patient is to call cancer center for refills.  Also interested in follow-up imaging in March-we will order.  C-please schedule a follow-up in early March 2021-MD-VIDEO;2-3 days prior- labs-CBC CMP; serum drug screen/urine drug screen; CT chest- Dr.B  Thanks

## 2019-04-29 NOTE — Addendum Note (Signed)
Addended by: Sabino Gasser on: 04/29/2019 08:37 AM   Modules accepted: Orders

## 2019-04-29 NOTE — Telephone Encounter (Signed)
Colette, please schedule the pt for the apts - per Dr. Jacinto Reap

## 2019-04-30 ENCOUNTER — Ambulatory Visit (HOSPITAL_BASED_OUTPATIENT_CLINIC_OR_DEPARTMENT_OTHER): Payer: Medicare Other | Admitting: Pain Medicine

## 2019-04-30 ENCOUNTER — Other Ambulatory Visit: Payer: Self-pay

## 2019-04-30 ENCOUNTER — Encounter: Payer: Self-pay | Admitting: Pain Medicine

## 2019-04-30 ENCOUNTER — Ambulatory Visit
Admission: RE | Admit: 2019-04-30 | Discharge: 2019-04-30 | Disposition: A | Discharge status: Home / Self Care | Payer: Medicare Other | Source: Ambulatory Visit | Attending: Pain Medicine | Admitting: Pain Medicine

## 2019-04-30 VITALS — BP 120/47 | HR 66 | Temp 97.4°F | Resp 15 | Ht 69.0 in | Wt 186.0 lb

## 2019-04-30 DIAGNOSIS — M51379 Other intervertebral disc degeneration, lumbosacral region without mention of lumbar back pain or lower extremity pain: Secondary | ICD-10-CM

## 2019-04-30 DIAGNOSIS — M533 Sacrococcygeal disorders, not elsewhere classified: Secondary | ICD-10-CM

## 2019-04-30 DIAGNOSIS — M5137 Other intervertebral disc degeneration, lumbosacral region: Secondary | ICD-10-CM

## 2019-04-30 DIAGNOSIS — M488X8 Other specified spondylopathies, sacral and sacrococcygeal region: Secondary | ICD-10-CM | POA: Insufficient documentation

## 2019-04-30 DIAGNOSIS — M47816 Spondylosis without myelopathy or radiculopathy, lumbar region: Secondary | ICD-10-CM | POA: Insufficient documentation

## 2019-04-30 MED ORDER — SODIUM CHLORIDE 0.9% FLUSH
2.0000 mL | Freq: Once | INTRAVENOUS | Status: AC
  Administered 2019-04-30: 2 mL

## 2019-04-30 MED ORDER — FENTANYL CITRATE (PF) 100 MCG/2ML IJ SOLN
25.0000 ug | INTRAMUSCULAR | Status: DC | PRN
  Administered 2019-04-30: 50 ug via INTRAVENOUS
  Filled 2019-04-30: qty 2

## 2019-04-30 MED ORDER — TRIAMCINOLONE ACETONIDE 40 MG/ML IJ SUSP
40.0000 mg | Freq: Once | INTRAMUSCULAR | Status: AC
  Administered 2019-04-30: 40 mg
  Filled 2019-04-30: qty 1

## 2019-04-30 MED ORDER — ROPIVACAINE HCL 2 MG/ML IJ SOLN
2.0000 mL | Freq: Once | INTRAMUSCULAR | Status: AC
  Administered 2019-04-30: 2 mL via EPIDURAL
  Filled 2019-04-30: qty 10

## 2019-04-30 MED ORDER — LACTATED RINGERS IV SOLN
1000.0000 mL | Freq: Once | INTRAVENOUS | Status: AC
  Administered 2019-04-30: 09:00:00 1000 mL via INTRAVENOUS

## 2019-04-30 MED ORDER — IOHEXOL 180 MG/ML  SOLN
10.0000 mL | Freq: Once | INTRAMUSCULAR | Status: AC
  Administered 2019-04-30: 09:00:00 10 mL via EPIDURAL
  Filled 2019-04-30: qty 20

## 2019-04-30 MED ORDER — LIDOCAINE HCL 2 % IJ SOLN
20.0000 mL | Freq: Once | INTRAMUSCULAR | Status: AC
  Administered 2019-04-30: 09:00:00 400 mg
  Filled 2019-04-30: qty 40

## 2019-04-30 MED ORDER — MIDAZOLAM HCL 5 MG/5ML IJ SOLN
1.0000 mg | INTRAMUSCULAR | Status: DC | PRN
  Administered 2019-04-30: 1 mg via INTRAVENOUS
  Filled 2019-04-30: qty 5

## 2019-04-30 NOTE — Progress Notes (Signed)
Patient's Name: Joseph Graham  MRN: [id number]  Referring Provider: [name], MD  DOB: 1941/04/15  PCP: [name], MD  DOS: 04/30/2019  Note by: [name], MD  Service setting: Ambulatory outpatient  Specialty: Interventional Pain Management  Patient type: Established  Location: [facility] ([facility]  Visit type: Interventional Procedure   Primary Reason for Visit: Interventional Pain Management Treatment. CC: Back Pain (lower)  Procedure:          Anesthesia, Analgesia, Anxiolysis:  Type: Therapeutic Epidural Steroid Injection #3  Region: Caudal Level: Sacrococcygeal   Laterality: Midline       Type: Moderate (Conscious) Sedation combined with Local Anesthesia Indication(s): Analgesia and Anxiety Route: Intravenous (IV) IV Access: Secured Sedation: Meaningful verbal contact was maintained at all times during the procedure  Local Anesthetic: Lidocaine 1-2%  Position: Prone   Indications: 1. 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 Coccygodynia   3. DDD (degenerative disc disease), lumbosacral   4. Lumbar spondylosis   5. Other specified spondylopathies, sacral and sacrococcygeal region [facility])    Pain Score: Pre-procedure: 6 /10 Post-procedure: 0-No pain/10   Pre-op Assessment:  Mr. Hooton is a 78 y.o. (year old), male patient, seen today for interventional treatment. He  has a past surgical history that includes Hemorroidectomy (1982); A flutter ablation (03/2010); Cataract extraction; carotid US (03/2010); Colonoscopy (09/2011); Tonsillectomy (1964); Electormagnetic navigation bronchoscopy (N/A, 03/02/2015); Transurethral resection of bladder tumor (N/A, 04/06/2015); Cystoscopy w/ retrogrades (Bilateral, 04/06/2015); urinary stent removal (04/14/15); Transurethral resection of bladder tumor (N/A, 07/13/2015); Portacath placement (N/A, 08/10/2015); Port-a-cath removal (Right, 04/03/2016); Polypectomy; Lung biopsy; and  Hemi-microdiscectomy lumbar laminectomy level 1 (N/A, 01/26/2019). Mr. Demarais has a current medication list which includes the following prescription(s): acetaminophen, albuterol, vitamin d-3, gabapentin, hydrocodone-acetaminophen, lisinopril, metoprolol tartrate, multivitamin with minerals, NON FORMULARY, omeprazole, oxygen-helium, polyethylene glycol, primidone, vitamin b-12, and vitamin e, and the following Facility-Administered Medications: fentanyl and midazolam. His primarily concern today is the Back Pain (lower)  Initial Vital Signs:  Pulse/HCG Rate: 66ECG Heart Rate: 60 Temp: (!) 97.3 F (36.3 C) Resp: 16 BP: (!) 140/57 SpO2: 94 %(Oxygen at 2L per [facility])  BMI: Estimated body mass index is 27.47 kg/m as calculated from the following:   Height as of this encounter: 5\' 9"  (1.753 m).   Weight as of this encounter: 186 lb (84.4 kg).  Risk Assessment: Allergies: Reviewed. He has No Known Allergies.  Allergy Precautions: None required Coagulopathies: Reviewed. None identified.  Blood-thinner therapy: None at this time Active Infection(s): Reviewed. None identified. Mr. Gossman is afebrile  Site Confirmation: Mr. Auxier was asked to confirm the procedure and laterality before marking the site Procedure checklist: Completed Consent: Before the procedure and under the influence of no sedative(s), amnesic(s), or anxiolytics, the patient was informed of the treatment options, risks and possible complications. To fulfill our ethical and legal obligations, as recommended by the American Medical Association's Code of Ethics, I have informed the patient of my clinical impression; the nature and purpose of the treatment or procedure; the risks, benefits, and possible complications of the intervention; the alternatives, including doing nothing; the risk(s) and benefit(s) of the alternative treatment(s) or procedure(s); and the risk(s) and benefit(s) of doing nothing. The patient was provided  information about the general risks and possible complications associated with the procedure. These may include, but are not limited to: failure to achieve desired goals, infection, bleeding, organ or nerve damage, allergic reactions, paralysis, and death. In addition, the patient  was informed of those risks and complications associated to Spine-related procedures, such as failure to decrease pain; infection (i.e.: Meningitis, epidural or intraspinal abscess); bleeding (i.e.: epidural hematoma, subarachnoid hemorrhage, or any other type of intraspinal or peri-dural bleeding); organ or nerve damage (i.e.: Any type of peripheral nerve, nerve root, or spinal cord injury) with subsequent damage to sensory, motor, and/or autonomic systems, resulting in permanent pain, numbness, and/or weakness of one or several areas of the body; allergic reactions; (i.e.: anaphylactic reaction); and/or death. Furthermore, the patient was informed of those risks and complications associated with the medications. These include, but are not limited to: allergic reactions (i.e.: anaphylactic or anaphylactoid reaction(s)); adrenal axis suppression; blood sugar elevation that in diabetics may result in ketoacidosis or comma; water retention that in patients with history of congestive heart failure may result in shortness of breath, pulmonary edema, and decompensation with resultant heart failure; weight gain; swelling or edema; medication-induced neural toxicity; particulate matter embolism and blood vessel occlusion with resultant organ, and/or nervous system infarction; and/or aseptic necrosis of one or more joints. Finally, the patient was informed that Medicine is not an exact science; therefore, there is also the possibility of unforeseen or unpredictable risks and/or possible complications that may result in a catastrophic outcome. The patient indicated having understood very clearly. We have given the patient no guarantees and we  have made no promises. Enough time was given to the patient to ask questions, all of which were answered to the patient's satisfaction. Mr. Trotta has indicated that he wanted to continue with the procedure. Attestation: I, the ordering provider, attest that I have discussed with the patient the benefits, risks, side-effects, alternatives, likelihood of achieving goals, and potential problems during recovery for the procedure that I have provided informed consent. Date  Time: 04/30/2019  8:53 AM  Pre-Procedure Preparation:  Monitoring: As per clinic protocol. Respiration, ETCO2, SpO2, BP, heart rate and rhythm monitor placed and checked for adequate function Safety Precautions: Patient was assessed for positional comfort and pressure points before starting the procedure. Time-out: I initiated and conducted the "Time-out" before starting the procedure, as per protocol. The patient was asked to participate by confirming the accuracy of the "Time Out" information. Verification of the correct person, site, and procedure were performed and confirmed by me, the nursing staff, and the patient. "Time-out" conducted as per Joint Commission's Universal Protocol (UP.01.01.01). Time: 0931  Description of Procedure:          Target Area: Caudal Epidural Canal. Approach: Midline approach. Area Prepped: Entire Posterior Sacrococcygeal Region Prepping solution: DuraPrep (Iodine Povacrylex [0.7% available iodine] and Isopropyl Alcohol, 74% w/w) Safety Precautions: Aspiration looking for blood return was conducted prior to all injections. At no point did we inject any substances, as a needle was being advanced. No attempts were made at seeking any paresthesias. Safe injection practices and needle disposal techniques used. Medications properly checked for expiration dates. SDV (single dose vial) medications used. Description of the Procedure: Protocol guidelines were followed. The patient was placed in position over  the fluoroscopy table. The target area was identified and the area prepped in the usual manner. Skin & deeper tissues infiltrated with local anesthetic. Appropriate amount of time allowed to pass for local anesthetics to take effect. The procedure needles were then advanced to the target area. Proper needle placement secured. Negative aspiration confirmed. Solution injected in intermittent fashion, asking for systemic symptoms every 0.5cc of injectate. The needles were then removed and the area cleansed, making sure  to leave some of the prepping solution back to take advantage of its long term bactericidal properties. Vitals:   04/30/19 0934 04/30/19 0940 04/30/19 0950 04/30/19 1001  BP: (!) 120/56 (!) 108/33 (!) 112/45 (!) 120/47  Pulse:      Resp: 20 17 13 15   Temp:  (!) 97.3 F (36.3 C)  (!) 97.4 F (36.3 C)  TempSrc:      SpO2: 95% 93% 95% 96%  Weight:      Height:        Start Time: 0931 hrs. End Time: 0934 hrs. Materials:  Needle(s) Type: Epidural needle Gauge: 17G Length: 3.5-in Medication(s): Please see orders for medications and dosing details.  Imaging Guidance (Spinal):          Type of Imaging Technique: Fluoroscopy Guidance (Spinal) Indication(s): Assistance in needle guidance and placement for procedures requiring needle placement in or near specific anatomical locations not easily accessible without such assistance. Exposure Time: Please see nurses notes. Contrast: Before injecting any contrast, we confirmed that the patient did not have an allergy to iodine, shellfish, or radiological contrast. Once satisfactory needle placement was completed at the desired level, radiological contrast was injected. Contrast injected under live fluoroscopy. No contrast complications. See chart for type and volume of contrast used. Fluoroscopic Guidance: I was personally present during the use of fluoroscopy. "Tunnel Vision Technique" used to obtain the best possible view of the target area.  Parallax error corrected before commencing the procedure. "Direction-depth-direction" technique used to introduce the needle under continuous pulsed fluoroscopy. Once target was reached, antero-posterior, oblique, and lateral fluoroscopic projection used confirm needle placement in all planes. Images permanently stored in EMR. Interpretation: I personally interpreted the imaging intraoperatively. Adequate needle placement confirmed in multiple planes. Appropriate spread of contrast into desired area was observed. No evidence of afferent or efferent intravascular uptake. No intrathecal or subarachnoid spread observed. Permanent images saved into the patient's record.  Antibiotic Prophylaxis:   Anti-infectives (From admission, onward)   None     Indication(s): None identified  Post-operative Assessment:  Post-procedure Vital Signs:  Pulse/HCG Rate: 66(!) 57 Temp: (!) 97.4 F (36.3 C) Resp: 15 BP: (!) 120/47 SpO2: 96 %  EBL: None  Complications: No immediate post-treatment complications observed by team, or reported by patient.  Note: The patient tolerated the entire procedure well. A repeat set of vitals were taken after the procedure and the patient was kept under observation following institutional policy, for this type of procedure. Post-procedural neurological assessment was performed, showing return to baseline, prior to discharge. The patient was provided with post-procedure discharge instructions, including a section on how to identify potential problems. Should any problems arise concerning this procedure, the patient was given instructions to immediately contact us, at any time, without hesitation. In any case, we plan to contact the patient by telephone for a follow-up status report regarding this interventional procedure.  Comments:  No additional relevant information.  Plan of Care  Orders:  Orders Placed This Encounter  Procedures  . Caudal ESI (Today)    Scheduling  Instructions:     Laterality: Midline     Level(s): Sacrococcygeal canal (Tailbone area)     Sedation: Patient's choice     Timeframe: Today    Order Specific Question:   Where will this procedure be performed?    Answer:   [facility]  . Fluoro (C-Arm) (<60 min) (No Report)    Intraoperative interpretation by procedural physician at [place].  Standing Status:   Standing    Number of Occurrences:   1    Order Specific Question:   Reason for exam:    Answer:   Assistance in needle guidance and placement for procedures requiring needle placement in or near specific anatomical locations not easily accessible without such assistance.  . Consent: Caudal ESI    Nursing Order: Transcribe to consent form and obtain patient signature. Note: Always confirm laterality of pain with Mr. Lafavor, before procedure. Procedure: Caudal epidural steroid injection Indication/Reason: Low back pain and lower extremity pain secondary to lumbosacral radiculitis Provider Attestation: I, Ripley Dossie Arbour, MD, (Pain Management Specialist), the physician/practitioner, attest that I have discussed with the patient the benefits, risks, side effects, alternatives, likelihood of achieving goals and potential problems during recovery for the procedure that I have provided informed consent.  Marland Kitchen Epidural Tray    Equipment required: Single use, disposable, "Epidural Tray" Epidural Catheter: NOT required    Standing Status:   Standing    Number of Occurrences:   1    Order Specific Question:   Specify    Answer:   Epidural Tray   Chronic Opioid Analgesic:  No opioid analgesics prescribed by our practice. Hydrocodone/APAP 5/325 mg, 1 tab PO q8 hrs (15 mg/day of hydrocodone) (Last prescribed on 02/18/2019, by Cammie Sickle, MD & Verlon Au, NP. MME/day:15mg /day.   Medications ordered for procedure: Meds ordered this encounter  Medications  . iohexol (OMNIPAQUE) 180 MG/ML injection  10 mL    Must be Myelogram-compatible. If not available, you may substitute with a water-soluble, non-ionic, hypoallergenic, myelogram-compatible radiological contrast medium.  Marland Kitchen lidocaine (XYLOCAINE) 2 % (with pres) injection 400 mg  . lactated ringers infusion 1,000 mL  . midazolam (VERSED) 5 MG/5ML injection 1-2 mg    Make sure Flumazenil is available in the pyxis when using this medication. If oversedation occurs, administer 0.2 mg IV over 15 sec. If after 45 sec no response, administer 0.2 mg again over 1 min; may repeat at 1 min intervals; not to exceed 4 doses (1 mg)  . fentaNYL (SUBLIMAZE) injection 25-50 mcg    Make sure Narcan is available in the pyxis when using this medication. In the event of respiratory depression (RR< 8/min): Titrate NARCAN (naloxone) in increments of 0.1 to 0.2 mg IV at 2-3 minute intervals, until desired degree of reversal.  . sodium chloride flush (NS) 0.9 % injection 2 mL  . ropivacaine (PF) 2 mg/mL (0.2%) (NAROPIN) injection 2 mL  . triamcinolone acetonide (KENALOG-40) injection 40 mg   Medications administered: We administered iohexol, lidocaine, lactated ringers, midazolam, fentaNYL, sodium chloride flush, ropivacaine (PF) 2 mg/mL (0.2%), and triamcinolone acetonide.  See the medical record for exact dosing, route, and time of administration.  Follow-up plan:   Return in about 2 weeks (around 05/14/2019) for (VV), (PP).       Interventional management options: Considering: Possible Racz procedure Diagnostic coccygeal injection DiagnosticrightL4-5 LESI Diagnostic right L3-4 LESI Diagnostic right L2 TFESI Diagnostic right L3 TFESI Diagnostic right L4 TFESI DiagnosticbilateralLSB Possible bilateral lumbar facetRFA Diagnostic bilateral S-Iblock Possible bilateral S-I jointRFA   Palliative PRN treatment(s): Palliative midline caudal ESIs #3 (he typically gets 100% relief of the pain for about 3 months) Palliative bilateral  lumbar facet blocks #2 (100/100/90/90) (helped w/ LE spasms & LBP)  Diagnosticleft L5 transverse process and sacrumpseudoarticulationinjection #2      Recent Visits Date Type Provider Dept  04/06/19 Office Visit Milinda Pointer, MD [facility]  02/12/19 Office Visit Milinda Pointer, MD [facility]  Showing recent visits within past 90 days and meeting all other requirements   Today's Visits Date Type Provider Dept  04/30/19 Procedure visit Milinda Pointer, MD [facility]  Showing today's visits and meeting all other requirements   Future Appointments Date Type Provider Dept  05/13/19 Appointment Milinda Pointer, MD [facility]  Showing future appointments within next 90 days and meeting all other requirements   Disposition: Discharge home  Discharge Date & Time: 04/30/2019; 1002 hrs.   Primary Care Physician: [name], MD Location: [facility] Note by: [name], MD Date: 04/30/2019; Time: 10:11 AM  Disclaimer:  Medicine is not an exact science. The only guarantee in medicine is that nothing is guaranteed. It is important to note that the decision to proceed with this intervention was based on the information collected from the patient. The Data and conclusions were drawn from the patient's questionnaire, the interview, and the physical examination. Because the information was provided in large part by the patient, it cannot be guaranteed that it has not been purposely or unconsciously manipulated. Every effort has been made to obtain as much relevant data as possible for this evaluation. It is important to note that the conclusions that lead to this procedure are derived in large part from the available data. Always take into account that the treatment will also be dependent on availability of resources and existing treatment guidelines, considered by other Pain Management  Practitioners as being common knowledge and practice, at the time of the intervention. For Medico-Legal purposes, it is also important to point out that variation in procedural techniques and pharmacological choices are the acceptable norm. The indications, contraindications, technique, and results of the above procedure should only be interpreted and judged by a Board-Certified Interventional Pain Specialist with extensive familiarity and expertise in the same exact procedure and technique.

## 2019-04-30 NOTE — Progress Notes (Signed)
Safety precautions to be maintained throughout the outpatient stay will include: orient to surroundings, keep bed in low position, maintain call bell within reach at all times, provide assistance with transfer out of bed and ambulation.  

## 2019-04-30 NOTE — Patient Instructions (Signed)

## 2019-05-01 ENCOUNTER — Telehealth: Payer: Self-pay

## 2019-05-01 NOTE — Telephone Encounter (Signed)
Post procedure phone call.  Patient states he is doing good.  

## 2019-05-12 ENCOUNTER — Encounter: Payer: Self-pay | Admitting: Pain Medicine

## 2019-05-12 NOTE — Progress Notes (Signed)
Pain relief after procedure (treated area only): (Questions asked to patient) 1. Starting about 15 minutes after the procedure, and "while the area was still numb" (from the local anesthetics), were you having any of your usual pain "in that area"? (NOT including the discomfort from the needle sticks.) First 1 hour: 100 % better. First 4-6 hours: 100 % better. 2. Assuming that it did get numb. How long was the area numb? 100 % benefit, longer than 6 hours. How long? 48 hours. 3. How much better is your pain now, when compared to before the procedure? Current benefit: 100 % better. 4. Can you move better now? Improvement in ROM (Range of Motion): Yes. 5. Can you do more now? Improvement in function: Yes. 4. Did you have any problems with the procedure? Side-effects/Complications: No.

## 2019-05-12 NOTE — Progress Notes (Deleted)
Pain Management Virtual Encounter Note - Virtual Visit via Telephone Telehealth (real-time audio visits between healthcare provider and patient).   Patient's Phone No. & Preferred Pharmacy:  [phone] (home); [phone] (mobile); (Preferred) [phone] [email or web address]  [id number] - [place], [place] [address] [place] [address] Phone: [phone] Fax: [phone]    Pre-screening note:  Our staff contacted [name] and offered him an "in person", "face-to-face" appointment versus a telephone encounter. He indicated preferring the telephone encounter, at this time.   Reason for Virtual Visit: COVID-19*  Social distancing based on CDC and AMA recommendations.   I contacted [name] on 05/13/2019 via telephone.      I clearly identified myself as [name], MD. I verified that I was speaking with the correct person using two identifiers (Name: Joseph Graham, and date of birth: 02/06/1941).  Advanced Informed Consent I sought verbal advanced consent from [name] for virtual visit interactions. I informed [name] of possible security and privacy concerns, risks, and limitations associated with providing "not-in-person" medical evaluation and management services. I also informed [name] of the availability of "in-person" appointments. Finally, I informed him that there would be a charge for the virtual visit and that he could be  personally, fully or partially, financially responsible for it. [name] expressed understanding and agreed to proceed.   Historic Elements   [name] is a 78 y.o. year old, male patient evaluated today after his last encounter by our practice on 05/01/2019. [name]  has a past medical history of Alcohol abuse, in remission, Arthritis, Benign essential tremor, BPH (benign prostatic hypertrophy) (12/30/2012),  Cardiac arrhythmia (03/2010), Cataract, Chemotherapy-induced neutropenia ([place]) (09/23/2015), Chronic low back pain, Colon polyps, Complication of anesthesia (age 57-23), COPD (chronic obstructive pulmonary disease) ([place]), Coronary artery disease, Dyslipidemia, GERD (gastroesophageal reflux disease), History of kidney stones, HTN (hypertension), Macular degeneration, bilateral, Multiple pulmonary nodules (02/2015), Neuromuscular disorder ([place]), Peripheral neuropathy due to chemotherapy ([place]) (12/23/2015), Personal history of tobacco use, presenting hazards to health (02/18/2015), Pneumothorax after biopsy (04/01/2015), Pre-diabetes, Primary cancer of bladder ([place]) (2016), Primary lung cancer ([place]) (2016), and Shortness of breath dyspnea. He also  has a past surgical history that includes Hemorroidectomy (1982); A flutter ablation (03/2010); Cataract extraction; carotid US (03/2010); Colonoscopy (09/2011); Tonsillectomy (1964); Electormagnetic navigation bronchoscopy (N/A, 03/02/2015); Transurethral resection of bladder tumor (N/A, 04/06/2015); Cystoscopy w/ retrogrades (Bilateral, 04/06/2015); urinary stent removal (04/14/15); Transurethral resection of bladder tumor (N/A, 07/13/2015); Portacath placement (N/A, 08/10/2015); Port-a-cath removal (Right, 04/03/2016); Polypectomy; Lung biopsy; and Hemi-microdiscectomy lumbar laminectomy level 1 (N/A, 01/26/2019). [name] has a current medication list which includes the following prescription(s): acetaminophen, albuterol, vitamin d-3, gabapentin, hydrocodone-acetaminophen, lisinopril, metoprolol tartrate, multivitamin with minerals, NON FORMULARY, omeprazole, oxygen-helium, polyethylene glycol, primidone, vitamin b-12, and vitamin e. He  reports that he quit smoking about 4 years ago. His smoking use included cigarettes. He has a 91.50 pack-year smoking history. He has never used smokeless tobacco. He reports that he does not drink alcohol or use drugs. [name] has No Known  Allergies.   HPI  Today, he is being contacted for a post-procedure assessment.  Post-Procedure Evaluation  Procedure: *** Pre-procedure pain level:  ***/10 Post-procedure: ***/10          Sedation: Please see nurses note.  [name], RN  05/12/2019  9:24 AM  Sign when Signing Visit Pain relief after  procedure (treated area only): (Questions asked to patient) 1. Starting about 15 minutes after the procedure, and "while the area was still numb" (from the local anesthetics), were you having any of your usual pain "in that area"? (NOT including the discomfort from the needle sticks.) First 1 hour: 100 % better. First 4-6 hours: 100 % better. 2. Assuming that it did get numb. How long was the area numb? 100 % benefit, longer than 6 hours. How long? 48 hours. 3. How much better is your pain now, when compared to before the procedure? Current benefit: 100 % better. 4. Can you move better now? Improvement in ROM (Range of Motion): Yes. 5. Can you do more now? Improvement in function: Yes. 4. Did you have any problems with the procedure? Side-effects/Complications: No.   Current benefits: Defined as benefit that persist at this time.   Analgesia:   ***  Function:  ***  ROM:  ***   Pharmacotherapy Assessment  Analgesic: No opioid analgesics prescribed by our practice. Hydrocodone/APAP 5/325 mg, 1 tab PO q8 hrs (15 mg/day of hydrocodone) (Last prescribed on 02/18/2019, by Cammie Sickle, MD & Verlon Au, NP. MME/day:15mg /day.   Monitoring: Pharmacotherapy: No side-effects or adverse reactions reported. [place] PMP: PDMP reviewed during this encounter.       Compliance: No problems identified. Effectiveness: Clinically acceptable. Plan: Refer to "POC".  UDS:  Summary  Date Value Ref Range Status  07/14/2018 FINAL  Final    Comment:    ==================================================================== TOXASSURE COMP DRUG  ANALYSIS,UR ==================================================================== Test                             Result       Flag       Units Drug Present and Declared for Prescription Verification   Hydrocodone                    185          EXPECTED   ng/mg creat   Hydromorphone                  207          EXPECTED   ng/mg creat   Norhydrocodone                 563          EXPECTED   ng/mg creat    Sources of hydrocodone include scheduled prescription    medications. Hydromorphone and norhydrocodone are expected    metabolites of hydrocodone. Hydromorphone is also available as a    scheduled prescription medication.   Primidone                      PRESENT      EXPECTED   Phenobarbital                  PRESENT      EXPECTED    Phenobarbital is an expected metabolite of primidone;    Phenobarbital may also be administered as a prescription drug.   Gabapentin                     PRESENT      EXPECTED   Acetaminophen                  PRESENT      EXPECTED Drug Absent but Declared for Prescription  Verification   Metoprolol                     Not Detected UNEXPECTED ==================================================================== Test                      Result    Flag   Units      Ref Range   Creatinine              96               mg/dL      >=20 ==================================================================== Declared Medications:  The flagging and interpretation on this report are based on the  following declared medications.  Unexpected results may arise from  inaccuracies in the declared medications.  **Note: The testing scope of this panel includes these medications:  Gabapentin  Hydrocodone (Hydrocodone-Acetaminophen)  Metoprolol (Lopressor)  Primidone (Mysoline)  **Note: The testing scope of this panel does not include small to  moderate amounts of these reported medications:  Acetaminophen  Acetaminophen (Hydrocodone-Acetaminophen)  **Note: The testing  scope of this panel does not include following  reported medications:  Cannabidiol  Cholecalciferol  Cyanocobalamin  Lisinopril  Multivitamin  Omeprazole (Prilosec)  Polyethylene Glycol  Vitamin E ==================================================================== For clinical consultation, please call [phone]. ====================================================================    Laboratory Chemistry Profile (12 mo)  Renal: 07/14/2018: BUN/Creatinine Ratio 35 01/19/2019: BUN 28 03/09/2019: Creatinine, Ser 0.70  Lab Results  Component Value Date   GFR 105.58 07/04/2018   GFRAA >60 01/19/2019   GFRNONAA >60 01/19/2019   Hepatic: 07/14/2018: Albumin 4.6 Lab Results  Component Value Date   AST 19 07/14/2018   ALT 19 07/04/2018   Other: 07/14/2018: 25-Hydroxy, Vitamin D 30; 25-Hydroxy, Vitamin D-2 <1.0; 25-Hydroxy, Vitamin D-3 30; CRP 3; Sed Rate 51; Vitamin B-12 735 Note: Above Lab results reviewed.  Imaging  Fluoro (C-Arm) (<60 min) (No Report) Fluoro was used, but no Radiologist interpretation will be provided.  Please refer to "NOTES" tab for provider progress note.   Assessment  The primary encounter diagnosis was Chronic pain syndrome. Diagnoses of Chronic low back pain (Primary area of Pain) (Bilateral)  (L>R) w/o sciatica, Chronic coccyx pain (Secondary Area of Pain) (Midline), and Chronic lower extremity pain (Tertiary Area of Pain) (Bilateral) (R>L) were also pertinent to this visit.  Plan of Care  Problem-specific:  No problem-specific Assessment & Plan notes found for this encounter.  I am having Garyn L. Hermann "Clair Gulling" maintain his omeprazole, polyethylene glycol, NON FORMULARY, gabapentin, lisinopril, metoprolol tartrate, albuterol, primidone, vitamin E, vitamin B-12, acetaminophen, multivitamin with minerals, Vitamin D-3, OXYGEN, and HYDROcodone-acetaminophen.  Pharmacotherapy (Medications Ordered): No orders of the defined types were placed in this  encounter.  Orders:  No orders of the defined types were placed in this encounter.  Follow-up plan:   No follow-ups on file.      Interventional management options: Considering: Possible Racz procedure Diagnostic coccygeal injection DiagnosticrightL4-5 LESI Diagnostic right L3-4 LESI Diagnostic right L2 TFESI Diagnostic right L3 TFESI Diagnostic right L4 TFESI DiagnosticbilateralLSB Possible bilateral lumbar facetRFA Diagnostic bilateral S-Iblock Possible bilateral S-I jointRFA   Palliative PRN treatment(s): Palliative midline caudal ESIs #3 (he typically gets 100% relief of the pain for about 3 months) Palliative bilateral lumbar facet blocks #2 (100/100/90/90) (helped w/ LE spasms & LBP)  Diagnosticleft L5 transverse process and sacrumpseudoarticulationinjection #2       Recent Visits Date Type Provider Dept  04/30/19 Procedure visit Milinda Pointer, MD [facility]  04/06/19 Office Visit Milinda Pointer, MD [facility]  02/12/19 Office Visit Milinda Pointer, MD [facility]  Showing recent visits within past 90 days and meeting all other requirements   Future Appointments Date Type Provider Dept  05/13/19 Telemedicine Milinda Pointer, MD [facility]  Showing future appointments within next 90 days and meeting all other requirements   I discussed the assessment and treatment plan with the patient. The patient was provided an opportunity to ask questions and all were answered. The patient agreed with the plan and demonstrated an understanding of the instructions.  Patient advised to call back or seek an in-person evaluation if the symptoms or condition worsens.  Total duration of non-face-to-face encounter: *** minutes.  Note by: [name], MD Date: 05/13/2019; Time: 7:25 PM  Note: This dictation was prepared with Dragon dictation. Any transcriptional errors that may result from  this process are unintentional.  Disclaimer:  * Given the special circumstances of the COVID-19 pandemic, the federal government has announced that the Office for Civil Rights (OCR) will exercise its enforcement discretion and will not impose penalties on physicians using telehealth in the event of noncompliance with regulatory requirements under the [place] (HIPAA) in connection with the good faith provision of telehealth during the [employer] national public health emergency. ([place])

## 2019-05-12 NOTE — Telephone Encounter (Signed)
Contacted patient again to try and schedule for over due cysto he is still not interested in scheduling at this time and states he will call back

## 2019-05-13 ENCOUNTER — Ambulatory Visit: Payer: Medicare Other | Admitting: Pain Medicine

## 2019-05-13 ENCOUNTER — Other Ambulatory Visit: Payer: Self-pay

## 2019-05-14 ENCOUNTER — Encounter: Payer: Self-pay | Admitting: Pain Medicine

## 2019-05-14 NOTE — Progress Notes (Signed)
Pain relief after procedure (treated area only): (Questions asked to patient) 1. Starting about 15 minutes after the procedure, and "while the area was still numb" (from the local anesthetics), were you having any of your usual pain "in that area" (the treated area)?  (NOTE: NOT including the discomfort from the needle sticks.) First 1 hour: 100% better. First 4-6 hours:100 % better. 2. Assuming that it did get numb. How long was the area numb? 100 % benefit, longer than 6 hours. How long? 48 3. How much better is your pain now, when compared to before the procedure? Current benefit: 100 % better. 4. Can you move better now? Improvement in ROM (Range of Motion): Yes. 5. Can you do more now? Improvement in function: Yes. 4. Did you have any problems with the procedure? Side-effects/Complications: No.

## 2019-05-18 ENCOUNTER — Other Ambulatory Visit: Payer: Self-pay

## 2019-05-18 ENCOUNTER — Ambulatory Visit: Payer: Medicare Other | Attending: Pain Medicine | Admitting: Pain Medicine

## 2019-05-18 ENCOUNTER — Other Ambulatory Visit: Payer: Self-pay | Admitting: *Deleted

## 2019-05-18 DIAGNOSIS — M545 Low back pain, unspecified: Secondary | ICD-10-CM

## 2019-05-18 DIAGNOSIS — Q7649 Other congenital malformations of spine, not associated with scoliosis: Secondary | ICD-10-CM

## 2019-05-18 DIAGNOSIS — M79604 Pain in right leg: Secondary | ICD-10-CM

## 2019-05-18 DIAGNOSIS — G894 Chronic pain syndrome: Secondary | ICD-10-CM | POA: Diagnosis not present

## 2019-05-18 DIAGNOSIS — G8929 Other chronic pain: Secondary | ICD-10-CM

## 2019-05-18 DIAGNOSIS — M533 Sacrococcygeal disorders, not elsewhere classified: Secondary | ICD-10-CM

## 2019-05-18 DIAGNOSIS — G620 Drug-induced polyneuropathy: Secondary | ICD-10-CM

## 2019-05-18 DIAGNOSIS — C3411 Malignant neoplasm of upper lobe, right bronchus or lung: Secondary | ICD-10-CM

## 2019-05-18 DIAGNOSIS — C3491 Malignant neoplasm of unspecified part of right bronchus or lung: Secondary | ICD-10-CM

## 2019-05-18 DIAGNOSIS — M79605 Pain in left leg: Secondary | ICD-10-CM

## 2019-05-18 MED ORDER — HYDROCODONE-ACETAMINOPHEN 5-325 MG PO TABS: 1.0000 | ORAL_TABLET | Freq: Three times a day (TID) | ORAL | 0 refills | Status: DC | PRN

## 2019-05-18 NOTE — Progress Notes (Signed)
Pain Management Virtual Encounter Note - Virtual Visit via Telephone Telehealth (real-time audio visits between healthcare provider and patient).   Patient's Phone No. & Preferred Pharmacy:  [phone] (home); [phone] (mobile); (Preferred) [phone] [email or web address]  [id number] - [place], [place] [address] [place] [address] Phone: [phone] Fax: [phone]    Pre-screening note:  Our staff contacted Joseph Graham and offered him an "in person", "face-to-face" appointment versus a telephone encounter. He indicated preferring the telephone encounter, at this time.   Reason for Virtual Visit: COVID-19*  Social distancing based on CDC and AMA recommendations.   I contacted Joseph Graham on 05/18/2019 via telephone.      I clearly identified myself as Joseph Cola, MD. I verified that I was speaking with the correct person using two identifiers (Name: Joseph Graham, and date of birth: May 28, 1941).  Advanced Informed Consent I sought verbal advanced consent from Joseph Graham for virtual visit interactions. I informed Joseph Graham of possible security and privacy concerns, risks, and limitations associated with providing "not-in-person" medical evaluation and management services. I also informed Joseph Graham of the availability of "in-person" appointments. Finally, I informed him that there would be a charge for the virtual visit and that he could be  personally, fully or partially, financially responsible for it. Joseph Graham expressed understanding and agreed to proceed.   Historic Elements   Joseph Graham is a 78 y.o. year old, male patient evaluated today after his last encounter by our practice on 05/01/2019. Joseph Graham  has a past medical history of Alcohol abuse, in remission, Arthritis, Benign essential tremor, BPH (benign prostatic hypertrophy) (12/30/2012),  Cardiac arrhythmia (03/2010), Cataract, Chemotherapy-induced neutropenia ([place]) (09/23/2015), Chronic low back pain, Colon polyps, Complication of anesthesia (age [age]), COPD (chronic obstructive pulmonary disease) ([place]), Coronary artery disease, Dyslipidemia, GERD (gastroesophageal reflux disease), History of kidney stones, HTN (hypertension), Macular degeneration, bilateral, Multiple pulmonary nodules (02/2015), Neuromuscular disorder ([place]), Peripheral neuropathy due to chemotherapy ([place]) (12/23/2015), Personal history of tobacco use, presenting hazards to health (02/18/2015), Pneumothorax after biopsy (04/01/2015), Pre-diabetes, Primary cancer of bladder ([place]) (2016), Primary lung cancer ([place]) (2016), and Shortness of breath dyspnea. He also  has a past surgical history that includes Hemorroidectomy (1982); A flutter ablation (03/2010); Cataract extraction; carotid US (03/2010); Colonoscopy (09/2011); Tonsillectomy (1964); Electormagnetic navigation bronchoscopy (N/A, 03/02/2015); Transurethral resection of bladder tumor (N/A, 04/06/2015); Cystoscopy w/ retrogrades (Bilateral, 04/06/2015); urinary stent removal (04/14/15); Transurethral resection of bladder tumor (N/A, 07/13/2015); Portacath placement (N/A, 08/10/2015); Port-a-cath removal (Right, 04/03/2016); Polypectomy; Lung biopsy; and Hemi-microdiscectomy lumbar laminectomy level 1 (N/A, 01/26/2019). Joseph Graham has a current medication list which includes the following prescription(s): acetaminophen, albuterol, vitamin d-3, gabapentin, lisinopril, metoprolol tartrate, multivitamin with minerals, NON FORMULARY, omeprazole, oxygen-helium, polyethylene glycol, primidone, vitamin b-12, vitamin e, and hydrocodone-acetaminophen. He  reports that he quit smoking about 4 years ago. His smoking use included cigarettes. He has a 91.50 pack-year smoking history. He has never used smokeless tobacco. He reports that he does not drink alcohol or use drugs. Joseph Graham has No Known  Allergies.   HPI  Today, he is being contacted for a post-procedure assessment.  Post-Procedure Evaluation  Procedure: Therapeutic midline caudal ESI #3 under fluoroscopic guidance and IV sedation Pre-procedure pain level:  6/10 Post-procedure: 0/10 (100% relief)  Sedation: Sedation provided.  Joseph Martins, RN  05/14/2019  1:31 PM  Sign when Signing Visit Pain  relief after procedure (treated area only): (Questions asked to patient) 1. Starting about 15 minutes after the procedure, and "while the area was still numb" (from the local anesthetics), were you having any of your usual pain "in that area" (the treated area)?  (NOTE: NOT including the discomfort from the needle sticks.) First 1 hour: 100% better. First 4-6 hours:100 % better. 2. Assuming that it did get numb. How long was the area numb? 100 % benefit, longer than 6 hours. How long? 48 3. How much better is your pain now, when compared to before the procedure? Current benefit: 100 % better. 4. Can you move better now? Improvement in ROM (Range of Motion): Yes. 5. Can you do more now? Improvement in function: Yes. 4. Did you have any problems with the procedure? Side-effects/Complications: No.   Current benefits: Defined as benefit that persist at this time.   Analgesia:  90-100% better Function: Joseph Graham reports improvement in function ROM: Joseph Graham reports improvement in ROM  Pharmacotherapy Assessment  Analgesic: No opioid analgesics prescribed by our practice. Hydrocodone/APAP 5/325 mg, 1 tab PO q 8 hrs (15 mg/day of hydrocodone) (Last prescribed on 02/18/2019, by Joseph Sickle, MD & Joseph Au, NP. MME/day:15mg /day.   Monitoring: Pharmacotherapy: No side-effects or adverse reactions reported. [place] PMP: PDMP reviewed during this encounter.       Compliance: No problems identified. Effectiveness: Clinically acceptable. Plan: Refer to "POC".  UDS:  Summary  Date Value Ref Range Status   07/14/2018 FINAL  Final    Comment:    ==================================================================== TOXASSURE COMP DRUG ANALYSIS,UR ==================================================================== Test                             Result       Flag       Units Drug Present and Declared for Prescription Verification   Hydrocodone                    185          EXPECTED   ng/mg creat   Hydromorphone                  207          EXPECTED   ng/mg creat   Norhydrocodone                 563          EXPECTED   ng/mg creat    Sources of hydrocodone include scheduled prescription    medications. Hydromorphone and norhydrocodone are expected    metabolites of hydrocodone. Hydromorphone is also available as a    scheduled prescription medication.   Primidone                      PRESENT      EXPECTED   Phenobarbital                  PRESENT      EXPECTED    Phenobarbital is an expected metabolite of primidone;    Phenobarbital may also be administered as a prescription drug.   Gabapentin                     PRESENT      EXPECTED   Acetaminophen                  PRESENT  EXPECTED Drug Absent but Declared for Prescription Verification   Metoprolol                     Not Detected UNEXPECTED ==================================================================== Test                      Result    Flag   Units      Ref Range   Creatinine              96               mg/dL      >=20 ==================================================================== Declared Medications:  The flagging and interpretation on this report are based on the  following declared medications.  Unexpected results may arise from  inaccuracies in the declared medications.  **Note: The testing scope of this panel includes these medications:  Gabapentin  Hydrocodone (Hydrocodone-Acetaminophen)  Metoprolol (Lopressor)  Primidone (Mysoline)  **Note: The testing scope of this panel does not include small to   moderate amounts of these reported medications:  Acetaminophen  Acetaminophen (Hydrocodone-Acetaminophen)  **Note: The testing scope of this panel does not include following  reported medications:  Cannabidiol  Cholecalciferol  Cyanocobalamin  Lisinopril  Multivitamin  Omeprazole (Prilosec)  Polyethylene Glycol  Vitamin E ==================================================================== For clinical consultation, please call [phone]. ====================================================================    Laboratory Chemistry Profile (12 mo)  Renal: 07/14/2018: BUN/Creatinine Ratio 35 01/19/2019: BUN 28 03/09/2019: Creatinine, Ser 0.70  Lab Results  Component Value Date   GFR 105.58 07/04/2018   GFRAA >60 01/19/2019   GFRNONAA >60 01/19/2019   Hepatic: 07/14/2018: Albumin 4.6 Lab Results  Component Value Date   AST 19 07/14/2018   ALT 19 07/04/2018   Other: 07/14/2018: 25-Hydroxy, Vitamin D 30; 25-Hydroxy, Vitamin D-2 <1.0; 25-Hydroxy, Vitamin D-3 30; CRP 3; Sed Rate 51; Vitamin B-12 735 Note: Above Lab results reviewed.  Imaging  Fluoro (C-Arm) (<60 min) (No Report) Fluoro was used, but no Radiologist interpretation will be provided.  Please refer to "NOTES" tab for provider progress note.   Assessment  The primary encounter diagnosis was Chronic pain syndrome. Diagnoses of Chronic low back pain (Primary area of Pain) (Bilateral)  (L>R) w/o sciatica, Chronic coccyx pain (Secondary Area of Pain) (Midline), Chronic lower extremity pain (Tertiary Area of Pain) (Bilateral) (R>L), Coccygodynia, and Bertolotti's syndrome (Left) were also pertinent to this visit.  Plan of Care  Problem-specific:  No problem-specific Assessment & Plan notes found for this encounter.  I am having Joseph Graham "Clair Gulling" maintain his omeprazole, polyethylene glycol, NON FORMULARY, gabapentin, lisinopril, metoprolol tartrate, albuterol, primidone, vitamin E, vitamin B-12, acetaminophen,  multivitamin with minerals, Vitamin D-3, and OXYGEN.  Pharmacotherapy (Medications Ordered): No orders of the defined types were placed in this encounter.  Orders:  Orders Placed This Encounter  Procedures  . Caudal ESI (PRN)    Standing Status:   Standing    Number of Occurrences:   1    Standing Expiration Date:   05/17/2020    Scheduling Instructions:     Laterality: Midline     Level(s): Sacrococcygeal canal (Tailbone area)     Sedation: Patient's choice      TIMEFRAME: PRN procedure. (Joseph Graham will call when needed.)    Order Specific Question:   Where will this procedure be performed?    Answer:   [facility]   Follow-up plan:   Return for PRN Procedure(s): (ML) Caudal ESI.      Interventional  management options: Considering: Possible Racz procedure Diagnostic coccygeal injection DiagnosticrightL4-5 LESI Diagnostic right L3-4 LESI Diagnostic right L2 TFESI Diagnostic right L3 TFESI Diagnostic right L4 TFESI DiagnosticbilateralLSB Possible bilateral lumbar facetRFA Diagnostic bilateral S-Iblock Possible bilateral S-I jointRFA   Palliative PRN treatment(s): Palliative midline caudal ESIs #3 (he typically gets 100% relief of the pain for about 3 months) Palliative bilateral lumbar facet blocks #2 (100/100/90/90) (helped w/ LE spasms & LBP)  Diagnosticleft L5 transverse process and sacrumpseudoarticulationinjection #2        Recent Visits Date Type Provider Dept  04/30/19 Procedure visit Milinda Pointer, MD [facility]  04/06/19 Office Visit Milinda Pointer, MD [facility]  Showing recent visits within past 90 days and meeting all other requirements   Today's Visits Date Type Provider Dept  05/18/19 Telemedicine Milinda Pointer, MD [facility]  Showing today's visits and meeting all other requirements   Future Appointments No visits were found meeting these conditions.  Showing  future appointments within next 90 days and meeting all other requirements   I discussed the assessment and treatment plan with the patient. The patient was provided an opportunity to ask questions and all were answered. The patient agreed with the plan and demonstrated an understanding of the instructions.  Patient advised to call back or seek an in-person evaluation if the symptoms or condition worsens.  Total duration of non-face-to-face encounter: 13 minutes.  Note by: Joseph Cola, MD Date: 05/18/2019; Time: 3:21 PM  Note: This dictation was prepared with Dragon dictation. Any transcriptional errors that may result from this process are unintentional.  Disclaimer:  * Given the special circumstances of the COVID-19 pandemic, the federal government has announced that the Office for Civil Rights (OCR) will exercise its enforcement discretion and will not impose penalties on physicians using telehealth in the event of noncompliance with regulatory requirements under the [place] (HIPAA) in connection with the good faith provision of telehealth during the KWIOX-73 national public health emergency. ([place])

## 2019-07-01 ENCOUNTER — Other Ambulatory Visit: Payer: Self-pay | Admitting: *Deleted

## 2019-07-01 DIAGNOSIS — C3491 Malignant neoplasm of unspecified part of right bronchus or lung: Secondary | ICD-10-CM

## 2019-07-01 DIAGNOSIS — G620 Drug-induced polyneuropathy: Secondary | ICD-10-CM

## 2019-07-01 DIAGNOSIS — C3411 Malignant neoplasm of upper lobe, right bronchus or lung: Secondary | ICD-10-CM

## 2019-07-01 MED ORDER — HYDROCODONE-ACETAMINOPHEN 5-325 MG PO TABS: 1.0000 | ORAL_TABLET | Freq: Three times a day (TID) | ORAL | 0 refills | Status: DC | PRN

## 2019-07-01 NOTE — Telephone Encounter (Signed)
Patient called cancer center requesting refill of Norco 5-325 mg tab q 8 hours PRN for pain.  As mandated by the [place] STOP Act (Strengthen Opioid Misuse Prevention), the [place] Controlled Substance Reporting System ([place]) was reviewed for this patient. Below is the past 16-months of controlled substance prescriptions as displayed by the registry. I have personally consulted with my supervising physician, Dr. Grayland Ormond, who agrees that continuation of opiate therapy is medically appropriate at this time and agrees to provide continual monitoring, including urine/blood drug screens, as indicated. Refill is appropriate on or after  06/16/19.   [place]CCSRS reviewed: Reviewed and acceptable for treatment.    Faythe Casa, NP 07/01/2019 11:27 AM [phone]

## 2019-07-07 ENCOUNTER — Other Ambulatory Visit: Payer: Self-pay | Admitting: Family Medicine

## 2019-07-08 ENCOUNTER — Encounter: Payer: Self-pay | Admitting: Family Medicine

## 2019-07-08 LAB — COLOGUARD

## 2019-07-09 ENCOUNTER — Other Ambulatory Visit: Payer: Self-pay | Admitting: Family Medicine

## 2019-07-09 DIAGNOSIS — E782 Mixed hyperlipidemia: Secondary | ICD-10-CM

## 2019-07-09 DIAGNOSIS — E1151 Type 2 diabetes mellitus with diabetic peripheral angiopathy without gangrene: Secondary | ICD-10-CM

## 2019-07-09 DIAGNOSIS — E538 Deficiency of other specified B group vitamins: Secondary | ICD-10-CM

## 2019-07-09 DIAGNOSIS — C679 Malignant neoplasm of bladder, unspecified: Secondary | ICD-10-CM

## 2019-07-10 ENCOUNTER — Ambulatory Visit (INDEPENDENT_AMBULATORY_CARE_PROVIDER_SITE_OTHER): Payer: Medicare Other

## 2019-07-10 ENCOUNTER — Ambulatory Visit: Payer: Medicare Other

## 2019-07-10 ENCOUNTER — Other Ambulatory Visit (INDEPENDENT_AMBULATORY_CARE_PROVIDER_SITE_OTHER): Payer: Medicare Other

## 2019-07-10 ENCOUNTER — Other Ambulatory Visit: Payer: Self-pay

## 2019-07-10 VITALS — BP 147/63 | Wt 186.0 lb

## 2019-07-10 DIAGNOSIS — E538 Deficiency of other specified B group vitamins: Secondary | ICD-10-CM

## 2019-07-10 DIAGNOSIS — C679 Malignant neoplasm of bladder, unspecified: Secondary | ICD-10-CM

## 2019-07-10 DIAGNOSIS — E782 Mixed hyperlipidemia: Secondary | ICD-10-CM | POA: Diagnosis not present

## 2019-07-10 DIAGNOSIS — E1151 Type 2 diabetes mellitus with diabetic peripheral angiopathy without gangrene: Secondary | ICD-10-CM | POA: Diagnosis not present

## 2019-07-10 DIAGNOSIS — Z0000 Encounter for general adult medical examination without abnormal findings: Secondary | ICD-10-CM

## 2019-07-10 LAB — CBC WITH DIFFERENTIAL/PLATELET
Basophils Absolute: 0 10*3/uL (ref 0.0–0.1)
Basophils Relative: 0.8 % (ref 0.0–3.0)
Eosinophils Absolute: 0.2 10*3/uL (ref 0.0–0.7)
Eosinophils Relative: 4.3 % (ref 0.0–5.0)
HCT: 38.5 % — ABNORMAL LOW (ref 39.0–52.0)
Hemoglobin: 12.8 g/dL — ABNORMAL LOW (ref 13.0–17.0)
Lymphocytes Relative: 31.7 % (ref 12.0–46.0)
Lymphs Abs: 1.8 10*3/uL (ref 0.7–4.0)
MCHC: 33.1 g/dL (ref 30.0–36.0)
MCV: 100.8 fl — ABNORMAL HIGH (ref 78.0–100.0)
Monocytes Absolute: 0.7 10*3/uL (ref 0.1–1.0)
Monocytes Relative: 12.2 % — ABNORMAL HIGH (ref 3.0–12.0)
Neutro Abs: 3 10*3/uL (ref 1.4–7.7)
Neutrophils Relative %: 51 % (ref 43.0–77.0)
Platelets: 245 10*3/uL (ref 150.0–400.0)
RBC: 3.82 Mil/uL — ABNORMAL LOW (ref 4.22–5.81)
RDW: 14 % (ref 11.5–15.5)
WBC: 5.8 10*3/uL (ref 4.0–10.5)

## 2019-07-10 LAB — LIPID PANEL
Cholesterol: 210 mg/dL — ABNORMAL HIGH (ref 0–200)
HDL: 48.4 mg/dL (ref >39.00)
LDL Cholesterol: 130 mg/dL — ABNORMAL HIGH (ref 0–99)
NonHDL: 161.69
Total CHOL/HDL Ratio: 4
Triglycerides: 160 mg/dL — ABNORMAL HIGH (ref 0.0–149.0)
VLDL: 32 mg/dL (ref 0.0–40.0)

## 2019-07-10 LAB — COMPREHENSIVE METABOLIC PANEL
ALT: 17 U/L (ref 0–53)
AST: 18 U/L (ref 0–37)
Albumin: 4 g/dL (ref 3.5–5.2)
Alkaline Phosphatase: 95 U/L (ref 39–117)
BUN: 21 mg/dL (ref 6–23)
CO2: 33 mEq/L — ABNORMAL HIGH (ref 19–32)
Calcium: 8.9 mg/dL (ref 8.4–10.5)
Chloride: 99 mEq/L (ref 96–112)
Creatinine, Ser: 0.66 mg/dL (ref 0.40–1.50)
GFR: 116.42 mL/min (ref >60.00)
Glucose, Bld: 93 mg/dL (ref 70–99)
Potassium: 4.5 mEq/L (ref 3.5–5.1)
Sodium: 137 mEq/L (ref 135–145)
Total Bilirubin: 0.3 mg/dL (ref 0.2–1.2)
Total Protein: 7.3 g/dL (ref 6.0–8.3)

## 2019-07-10 LAB — VITAMIN B12: Vitamin B-12: 849 pg/mL (ref 211–911)

## 2019-07-10 LAB — HEMOGLOBIN A1C: Hgb A1c MFr Bld: 6.2 % (ref 4.6–6.5)

## 2019-07-10 NOTE — Patient Instructions (Signed)
[name] , Thank you for taking time to come for your Medicare Wellness Visit. I appreciate your ongoing commitment to your health goals. Please review the following plan we discussed and let me know if I can assist you in the future.   Screening recommendations/referrals: Colonoscopy: declined Recommended yearly ophthalmology/optometry visit for glaucoma screening and checkup Recommended yearly dental visit for hygiene and checkup  Vaccinations: Influenza vaccine: no candidate for flu vaccine Pneumococcal vaccine: Completed series Tdap vaccine: Up to date, completed 07/31/2011 Shingles vaccine: discussed    Advanced directives: copy in chart   Conditions/risks identified: diabetes, hyperlipidemia  Next appointment: 07/14/2019 @ 9:30 am   Preventive Care 79 Years and Older, Male Preventive care refers to lifestyle choices and visits with your health care provider that can promote health and wellness. What does preventive care include?  A yearly physical exam. This is also called an annual well check.  Dental exams once or twice a year.  Routine eye exams. Ask your health care provider how often you should have your eyes checked.  Personal lifestyle choices, including:  Daily care of your teeth and gums.  Regular physical activity.  Eating a healthy diet.  Avoiding tobacco and drug use.  Limiting alcohol use.  Practicing safe sex.  Taking low doses of aspirin every day.  Taking vitamin and mineral supplements as recommended by your health care provider. What happens during an annual well check? The services and screenings done by your health care provider during your annual well check will depend on your age, overall health, lifestyle risk factors, and family history of disease. Counseling  Your health care provider may ask you questions about your:  Alcohol use.  Tobacco use.  Drug use.  Emotional well-being.  Home and relationship well-being.  Sexual  activity.  Eating habits.  History of falls.  Memory and ability to understand (cognition).  Work and work Statistician. Screening  You may have the following tests or measurements:  Height, weight, and BMI.  Blood pressure.  Lipid and cholesterol levels. These may be checked every 5 years, or more frequently if you are over 16 years old.  Skin check.  Lung cancer screening. You may have this screening every year starting at age 79 if you have a 30-pack-year history of smoking and currently smoke or have quit within the past 15 years.  Fecal occult blood test (FOBT) of the stool. You may have this test every year starting at age 79.  Flexible sigmoidoscopy or colonoscopy. You may have a sigmoidoscopy every 5 years or a colonoscopy every 10 years starting at age 79.  Prostate cancer screening. Recommendations will vary depending on your family history and other risks.  Hepatitis C blood test.  Hepatitis B blood test.  Sexually transmitted disease (STD) testing.  Diabetes screening. This is done by checking your blood sugar (glucose) after you have not eaten for a while (fasting). You may have this done every 1-3 years.  Abdominal aortic aneurysm (AAA) screening. You may need this if you are a current or former smoker.  Osteoporosis. You may be screened starting at age 79 if you are at high risk. Talk with your health care provider about your test results, treatment options, and if necessary, the need for more tests. Vaccines  Your health care provider may recommend certain vaccines, such as:  Influenza vaccine. This is recommended every year.  Tetanus, diphtheria, and acellular pertussis (Tdap, Td) vaccine. You may need a Td booster every 10 years.  Zoster vaccine. You may need this after age 79.  Pneumococcal 13-valent conjugate (PCV13) vaccine. One dose is recommended after age 79.  Pneumococcal polysaccharide (PPSV23) vaccine. One dose is recommended after age 79. Talk to your health care provider about which screenings and vaccines you need and how often you need them. This information is not intended to replace advice given to you by your health care provider. Make sure you discuss any questions you have with your health care provider. Document Released: 06/24/2015 Document Revised: 02/15/2016 Document Reviewed: 03/29/2015 Elsevier Interactive Patient Education  2017 [employer] Prevention in the Home Falls can cause injuries. They can happen to people of all ages. There are many things you can do to make your home safe and to help prevent falls. What can I do on the outside of my home?  Regularly fix the edges of walkways and driveways and fix any cracks.  Remove anything that might make you trip as you walk through a door, such as a raised step or threshold.  Trim any bushes or trees on the path to your home.  Use bright outdoor lighting.  Clear any walking paths of anything that might make someone trip, such as rocks or tools.  Regularly check to see if handrails are loose or broken. Make sure that both sides of any steps have handrails.  Any raised decks and porches should have guardrails on the edges.  Have any leaves, snow, or ice cleared regularly.  Use sand or salt on walking paths during winter.  Clean up any spills in your garage right away. This includes oil or grease spills. What can I do in the bathroom?  Use night lights.  Install grab bars by the toilet and in the tub and shower. Do not use towel bars as grab bars.  Use non-skid mats or decals in the tub or shower.  If you need to sit down in the shower, use a plastic, non-slip stool.  Keep the floor dry. Clean up any water that spills on the floor as soon as it happens.  Remove soap buildup in the tub or shower regularly.  Attach bath mats securely with double-sided non-slip rug tape.  Do not have throw rugs and other things on the floor that can make  you trip. What can I do in the bedroom?  Use night lights.  Make sure that you have a light by your bed that is easy to reach.  Do not use any sheets or blankets that are too big for your bed. They should not hang down onto the floor.  Have a firm chair that has side arms. You can use this for support while you get dressed.  Do not have throw rugs and other things on the floor that can make you trip. What can I do in the kitchen?  Clean up any spills right away.  Avoid walking on wet floors.  Keep items that you use a lot in easy-to-reach places.  If you need to reach something above you, use a strong step stool that has a grab bar.  Keep electrical cords out of the way.  Do not use floor polish or wax that makes floors slippery. If you must use wax, use non-skid floor wax.  Do not have throw rugs and other things on the floor that can make you trip. What can I do with my stairs?  Do not leave any items on the stairs.  Make sure that there are  handrails on both sides of the stairs and use them. Fix handrails that are broken or loose. Make sure that handrails are as long as the stairways.  Check any carpeting to make sure that it is firmly attached to the stairs. Fix any carpet that is loose or worn.  Avoid having throw rugs at the top or bottom of the stairs. If you do have throw rugs, attach them to the floor with carpet tape.  Make sure that you have a light switch at the top of the stairs and the bottom of the stairs. If you do not have them, ask someone to add them for you. What else can I do to help prevent falls?  Wear shoes that:  Do not have high heels.  Have rubber bottoms.  Are comfortable and fit you well.  Are closed at the toe. Do not wear sandals.  If you use a stepladder:  Make sure that it is fully opened. Do not climb a closed stepladder.  Make sure that both sides of the stepladder are locked into place.  Ask someone to hold it for you, if  possible.  Clearly mark and make sure that you can see:  Any grab bars or handrails.  First and last steps.  Where the edge of each step is.  Use tools that help you move around (mobility aids) if they are needed. These include:  Canes.  Walkers.  Scooters.  Crutches.  Turn on the lights when you go into a dark area. Replace any light bulbs as soon as they burn out.  Set up your furniture so you have a clear path. Avoid moving your furniture around.  If any of your floors are uneven, fix them.  If there are any pets around you, be aware of where they are.  Review your medicines with your doctor. Some medicines can make you feel dizzy. This can increase your chance of falling. Ask your doctor what other things that you can do to help prevent falls. This information is not intended to replace advice given to you by your health care provider. Make sure you discuss any questions you have with your health care provider. Document Released: 03/24/2009 Document Revised: 11/03/2015 Document Reviewed: 07/02/2014 Elsevier Interactive Patient Education  2017 [employer].

## 2019-07-10 NOTE — Progress Notes (Signed)
PCP notes:  Health Maintenance: Declined eye exam and colonoscopy at this time   Abnormal Screenings: none   Patient concerns: Sinus issues   Nurse concerns: none   Next PCP appt.: 07/14/2019 @ 9:30 am

## 2019-07-10 NOTE — Progress Notes (Signed)
Subjective:   Joseph Graham is a 79 y.o. male who presents for Medicare Annual/Subsequent preventive examination.  Review of Systems: N/A   This visit is being conducted through telemedicine via telephone at the nurse health advisor's home address due to the COVID-19 pandemic. This patient has given me verbal consent via doximity to conduct this visit, patient states they are participating from their home address. Patient and myself are on the telephone call. There is no referral for this visit. Some vital signs may be absent or patient reported.    Patient identification: identified by name, DOB, and current address   Cardiac Risk Factors include: advanced age (>15men, >71 women);diabetes mellitus;dyslipidemia;male gender     Objective:    Vitals: BP (!) 147/63   Wt 186 lb (84.4 kg)   BMI 27.47 kg/m   Body mass index is 27.47 kg/m.  Advanced Directives 07/10/2019 03/10/2019 02/24/2019 01/26/2019 01/19/2019 08/28/2018 08/07/2018  Does Patient Have a Medical Advance Directive? Yes Yes Yes No Yes Yes Yes  Type of Paramedic of Center;Living will [place];Living will Living will;Healthcare Power of [place];Living will [place];Living will  Does patient want to make changes to medical advance directive? - No - Patient declined No - Patient declined - - - -  Copy of [place] in Chart? Yes - validated most recent copy scanned in chart (See row information) Yes - validated most recent copy scanned in chart (See row information) No - copy requested - - - -  Would patient like information on creating a medical advance directive? - No - Patient declined No - Patient declined - - - -    Tobacco Social History   Tobacco Use  Smoking Status Former Smoker  . Packs/day: 1.50  . Years: 61.00  . Pack years: 91.50  . Types: Cigarettes  . Quit date: 03/08/2015  . Years since  quitting: 4.3  Smokeless Tobacco Never Used  Tobacco Comment   cut back 0.5 cigarettes--stopped 03/07/15; now now chantix     Counseling given: Not Answered Comment: cut back 0.5 cigarettes--stopped 03/07/15; now now chantix   Clinical Intake:  Pre-visit preparation completed: Yes  Pain : 0-10 Pain Score: 8  Pain Type: Chronic pain Pain Location: (all over body) Pain Descriptors / Indicators: Aching Pain Onset: More than a month ago Pain Frequency: Intermittent     Nutritional Risks: None Diabetes: Yes CBG done?: No Did pt. bring in CBG monitor from home?: No  How often do you need to have someone help you when you read instructions, pamphlets, or other written materials from your doctor or pharmacy?: 1 - Never  Interpreter Needed?: No  Information entered by :: CJohnson, LPN  Past Medical History:  Diagnosis Date  . Alcohol abuse, in remission    remote   . Arthritis    BACK AND NECK  . Benign essential tremor    improved on metoprolol and gabapentin  . BPH (benign prostatic hypertrophy) 12/30/2012  . Cardiac arrhythmia 03/2010   h/o a flutter and CM, s/p ablation, normal stress test  . Cataract    bilat removed   . Chemotherapy-induced neutropenia ([place]) 09/23/2015  . Chronic low back pain    MRI 2004, multilevel DDD  . Colon polyps    next colonoscopy due around 2018  . Complication of anesthesia age 46-23   DID NOT GET COMPLETELY NUMB WITH TONSILLECTOMY  . COPD (chronic  obstructive pulmonary disease) ([place])   . Coronary artery disease   . Dyslipidemia    mild off meds  . GERD (gastroesophageal reflux disease)   . History of kidney stones    h/o  . HTN (hypertension)   . Macular degeneration, bilateral   . Multiple pulmonary nodules 02/2015  . Neuromuscular disorder ([place])    neuropathy from chemo....in feet  . Peripheral neuropathy due to chemotherapy ([place]) 12/23/2015   Saw Dr Manuella Ghazi, on gabapentin and cymbalta with hydrocodone PRN (03/2016)  . Personal  history of tobacco use, presenting hazards to health 02/18/2015   quit 06/2011, restarted 2014  . Pneumothorax after biopsy 04/01/2015  . Pre-diabetes   . Primary cancer of bladder ([place]) 2016   [place]  . Primary lung cancer ([place]) 2016   port a cath removed 03/2016  . Shortness of breath dyspnea    OCC WITH EXERTION   Past Surgical History:  Procedure Laterality Date  . A FLUTTER ABLATION  03/2010  . carotid US  03/2010   WNL  . CATARACT EXTRACTION    . COLONOSCOPY  09/2011   polyps, rpt due 5 yrs, mild diverticulosis [name])  . CYSTOSCOPY W/ RETROGRADES Bilateral 04/06/2015   Procedure: CYSTOSCOPY WITH RETROGRADE PYELOGRAM;  Surgeon: [name], MD;  Location: [facility] OR[facility];  Service: Urology;  Laterality: Bilateral;  . ELECTROMAGNETIC NAVIGATION BROCHOSCOPY N/A 03/02/2015   Procedure: ELECTROMAGNETIC NAVIGATION BRONCHOSCOPY;  Surgeon: [name], MD;  Location: [facility];  Service: Cardiopulmonary;  Laterality: N/A;  . 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 HEMORROIDECTOMY  1982  . LUNG BIOPSY     collapsed lung with chest tube  . POLYPECTOMY    . PORT-A-CATH REMOVAL Right 04/03/2016   Procedure: REMOVAL PORT-A-CATH;  Surgeon: [name], MD;  Location: [facility];  Service: General;  Laterality: Right;  . PORTACATH PLACEMENT N/A 08/10/2015   Procedure: INSERTION PORT-A-CATH;  Surgeon: [name], MD;  Location: [facility];  Service: General;  Laterality: N/A;  . [place]  . TRANSURETHRAL RESECTION OF BLADDER TUMOR N/A 04/06/2015   Procedure: TRANSURETHRAL RESECTION OF BLADDER TUMOR (TURBT) right ureteral stent placement;  Surgeon: [name], MD;  Location: [facility];  Service: Urology;  Laterality: N/A;  . TRANSURETHRAL RESECTION OF BLADDER TUMOR N/A 07/13/2015   Procedure: TRANSURETHRAL RESECTION OF BLADDER TUMOR (TURBT);   Surgeon: [name], MD;  Location: [facility];  Service: Urology;  Laterality: N/A;  . urinary stent removal  04/14/15   Family History  Problem Relation Age of Onset  . Cervical cancer Mother 61  . Hypertension Father   . Tremor Father        and several uncles/aunts (not parkinson's)  . Stroke Brother 11  . Bladder Cancer Brother 67  . Prostate cancer Neg Hx   . Kidney cancer Neg Hx   . Colon cancer Neg Hx   . Colon polyps Neg Hx   . Esophageal cancer Neg Hx   . Rectal cancer Neg Hx    Social History   Socioeconomic History  . Marital status: Married    Spouse name: Not on file  . Number of children: Not on file  . Years of education: Not on file  . Highest education level: Not on file  Occupational History  . Not on file  Tobacco Use  . Smoking status: Former Smoker    Packs/day:  1.50    Years: 61.00    Pack years: 91.50    Types: Cigarettes    Quit date: 03/08/2015    Years since quitting: 4.3  . Smokeless tobacco: Never Used  . Tobacco comment: cut back 0.5 cigarettes--stopped 03/07/15; now now chantix  Substance and Sexual Activity  . Alcohol use: No    Alcohol/week: 0.0 standard drinks    Comment: has not drank in 37 years  . Drug use: No  . Sexual activity: Never  Other Topics Concern  . Not on file  Social History Narrative   Caffeine: 1 cup decaf/day   Lives with wife   Occupation: retired, worked Press photographer   Activity: gym 3x/wk, Immunologist, hobbies (paint, building things)   Diet: healthy, good water, fruits/vegetables daily, red meat 1x/wk   Social Determinants of Radio broadcast assistant Strain: Low Risk   . Difficulty of Paying Living Expenses: Not hard at all  Food Insecurity: No Food Insecurity  . Worried About Charity fundraiser in the Last Year: Never true  . Ran Out of Food in the Last Year: Never true  Transportation Needs: No Transportation Needs  . Lack of Transportation (Medical): No  . Lack of Transportation (Non-Medical): No    Physical Activity: Inactive  . Days of Exercise per Week: 0 days  . Minutes of Exercise per Session: 0 min  Stress: No Stress Concern Present  . Feeling of Stress : Not at all  Social Connections:   . Frequency of Communication with Friends and Family: Not on file  . Frequency of Social Gatherings with Friends and Family: Not on file  . Attends Religious Services: Not on file  . Active Member of Clubs or Organizations: Not on file  . Attends Archivist Meetings: Not on file  . Marital Status: Not on file    Outpatient Encounter Medications as of 07/10/2019  Medication Sig  . acetaminophen (TYLENOL) 500 MG tablet Take 1,000 mg by mouth every 6 (six) hours as needed (pain/headaches.).  Marland Kitchen albuterol (PROAIR HFA) 108 (90 Base) MCG/ACT inhaler Inhale 2 puffs into the lungs every 6 (six) hours as needed for wheezing or shortness of breath.  . Cholecalciferol (VITAMIN D-3) 125 MCG (5000 UT) TABS Take 5,000 Units by mouth daily.  Marland Kitchen gabapentin (NEURONTIN) 300 MG capsule Take 300-600 mg by mouth See admin instructions. Take 1 capsule (300 mg) by mouth in the morning, 1 capsule (300 mg) by mouth at supper, & 2 capsules (600 mg) by mouth at bedtime.  Marland Kitchen HYDROcodone-acetaminophen (NORCO) 5-325 MG tablet Take 1 tablet by mouth every 8 (eight) hours as needed for moderate pain.  Marland Kitchen lisinopril (ZESTRIL) 10 MG tablet TAKE 1 TABLET BY MOUTH EVERY EVENING  . metoprolol tartrate (LOPRESSOR) 25 MG tablet TAKE 1/2 TABLET BY MOUTH TWICE DAILY  . Multiple Vitamin (MULTIVITAMIN WITH MINERALS) TABS tablet Take 1 tablet by mouth every evening.  . NON FORMULARY Take 500 mg by mouth daily. Hemp Oil 500 mg  . omeprazole (PRILOSEC) 20 MG capsule Take 20 mg by mouth every morning.   . OXYGEN Inhale 2 L into the lungs daily.  . polyethylene glycol (MIRALAX / GLYCOLAX) packet Take 17 g by mouth daily.  . primidone (MYSOLINE) 50 MG tablet Take 150 mg by mouth 2 (two) times a day.  . vitamin B-12 (CYANOCOBALAMIN)  100 MCG tablet Take 100 mcg by mouth every evening.  . vitamin E 400 UNIT capsule Take 400 Units by mouth every  evening.    No facility-administered encounter medications on file as of 07/10/2019.    Activities of Daily Living In your present state of health, do you have any difficulty performing the following activities: 07/10/2019 01/19/2019  Hearing? N N  Vision? N N  Difficulty concentrating or making decisions? N N  Walking or climbing stairs? Tempie Donning  Comment has to walk slowly, back pain -  Dressing or bathing? N N  Doing errands, shopping? N -  Preparing Food and eating ? N -  Using the Toilet? N -  In the past six months, have you accidently leaked urine? N -  Do you have problems with loss of bowel control? N -  Managing your Medications? N -  Managing your Finances? N -  Housekeeping or managing your Housekeeping? Y -  Some recent data might be hidden    Patient Care Team: [name], MD as PCP - General (Family Medicine) Cammie Sickle, MD as Consulting Physician (Internal Medicine) [name], MD as Consulting Physician (Urology) Vladimir Crofts, MD as Consulting Physician (Neurology) Minna Merritts, MD as Consulting Physician (Cardiology) Noreene Filbert, MD as Referring Physician (Radiation Oncology) [name], MD as Referring Physician (Cardiothoracic Surgery)   Assessment:   This is a routine wellness examination for [place].  Exercise Activities and Dietary recommendations Current Exercise Habits: Home exercise routine, Type of exercise: stretching, Time (Minutes): 10, Frequency (Times/Week): 7, Weekly Exercise (Minutes/Week): 70, Intensity: Mild, Exercise limited by: None identified  Goals    . Patient Stated     Starting 07/04/2018, I will continue to take medications as prescribed.     . Patient Stated     07/10/2019, I will maintain and continue medications as prescribed.        Fall Risk Fall Risk  07/10/2019 04/30/2019  01/15/2019 12/02/2018 08/28/2018  Falls in the past year? 0 0 0 0 0  Number falls in past yr: 0 - - - -  Injury with Fall? 0 - - - -  Risk for fall due to : Impaired balance/gait;Impaired mobility;Medication side effect - - - -  Follow up Falls evaluation completed;Falls prevention discussed - - - -   Is the patient's home free of loose throw rugs in walkways, pet beds, electrical cords, etc?   yes      Grab bars in the bathroom? yes      Handrails on the stairs?   yes      Adequate lighting?   yes  Timed Get Up and Go Performed: N/A  Depression Screen PHQ 2/9 Scores 07/10/2019 08/28/2018 08/07/2018 08/04/2018  PHQ - 2 Score 0 0 0 0  PHQ- 9 Score 0 - - -    Cognitive Function MMSE - Mini Mental State Exam 07/10/2019 07/04/2018 06/25/2016  Orientation to time 5 5 5   Orientation to Place 5 5 5   Registration 3 3 3   Attention/ Calculation 5 0 0  Recall 3 1 1   Recall-comments - unable to recall 2 of 3 words pt was unable to recall 2 of 3 words  Language- name 2 objects - 0 0  Language- repeat 1 1 1   Language- follow 3 step command - 3 3  Language- read & follow direction - 0 0  Write a sentence - 0 0  Copy design - 0 0  Total score - 18 18  Mini Cog  Mini-Cog screen was completed. Maximum score is 22. A value of 0 denotes this part of the  MMSE was not completed or the patient failed this part of the Mini-Cog screening.       Immunization History  Administered Date(s) Administered  . Influenza,inj,Quad PF,6+ Mos 07/03/2017, 07/08/2018  . Pneumococcal Conjugate-13 01/05/2014  . Pneumococcal Polysaccharide-23 06/11/2010  . Td 07/31/2011    Qualifies for Shingles Vaccine? Yes  Screening Tests Health Maintenance  Topic Date Due  . DTAP VACCINES (1) 08/30/1940  . OPHTHALMOLOGY EXAM  09/10/2018  . HEMOGLOBIN A1C  01/02/2019  . FOOT EXAM  01/16/2019  . COLONOSCOPY  07/09/2029 (Originally 09/17/2016)  . DTaP/Tdap/Td (2 - Tdap) 07/30/2021  . TETANUS/TDAP  07/30/2021  . PNA vac Low  Risk Adult  Completed   Cancer Screenings: Lung: Low Dose CT Chest recommended if Age 66-80 years, 30 pack-year currently smoking OR have quit w/in 15years. Patient does not qualify. Colorectal: declined  Additional Screenings:  Hepatitis C Screening: N/A      Plan:    Patient will maintain and continue medications as prescribed.   I have personally reviewed and noted the following in the patient's chart:   . Medical and social history . Use of alcohol, tobacco or illicit drugs  . Current medications and supplements . Functional ability and status . Nutritional status . Physical activity . Advanced directives . List of other physicians . Hospitalizations, surgeries, and ER visits in previous 12 months . Vitals . Screenings to include cognitive, depression, and falls . Referrals and appointments  In addition, I have reviewed and discussed with patient certain preventive protocols, quality metrics, and best practice recommendations. A written personalized care plan for preventive services as well as general preventive health recommendations were provided to patient.     Andrez Grime, LPN  1/89/8421

## 2019-07-14 ENCOUNTER — Encounter: Payer: Self-pay | Admitting: Family Medicine

## 2019-07-14 ENCOUNTER — Ambulatory Visit (INDEPENDENT_AMBULATORY_CARE_PROVIDER_SITE_OTHER): Payer: Medicare Other | Admitting: Family Medicine

## 2019-07-14 VITALS — BP 132/64 | HR 65 | Temp 97.3°F | Ht 69.0 in | Wt 186.0 lb

## 2019-07-14 DIAGNOSIS — E538 Deficiency of other specified B group vitamins: Secondary | ICD-10-CM

## 2019-07-14 DIAGNOSIS — E782 Mixed hyperlipidemia: Secondary | ICD-10-CM

## 2019-07-14 DIAGNOSIS — Z6831 Body mass index (BMI) 31.0-31.9, adult: Secondary | ICD-10-CM

## 2019-07-14 DIAGNOSIS — I452 Bifascicular block: Secondary | ICD-10-CM

## 2019-07-14 DIAGNOSIS — Z87891 Personal history of nicotine dependence: Secondary | ICD-10-CM

## 2019-07-14 DIAGNOSIS — Z0000 Encounter for general adult medical examination without abnormal findings: Secondary | ICD-10-CM

## 2019-07-14 DIAGNOSIS — C3411 Malignant neoplasm of upper lobe, right bronchus or lung: Secondary | ICD-10-CM

## 2019-07-14 DIAGNOSIS — Z1211 Encounter for screening for malignant neoplasm of colon: Secondary | ICD-10-CM

## 2019-07-14 DIAGNOSIS — R0902 Hypoxemia: Secondary | ICD-10-CM

## 2019-07-14 DIAGNOSIS — I2510 Atherosclerotic heart disease of native coronary artery without angina pectoris: Secondary | ICD-10-CM

## 2019-07-14 DIAGNOSIS — T485X5A Adverse effect of other anti-common-cold drugs, initial encounter: Secondary | ICD-10-CM

## 2019-07-14 DIAGNOSIS — G894 Chronic pain syndrome: Secondary | ICD-10-CM

## 2019-07-14 DIAGNOSIS — J310 Chronic rhinitis: Secondary | ICD-10-CM | POA: Diagnosis not present

## 2019-07-14 DIAGNOSIS — C679 Malignant neoplasm of bladder, unspecified: Secondary | ICD-10-CM

## 2019-07-14 DIAGNOSIS — G620 Drug-induced polyneuropathy: Secondary | ICD-10-CM

## 2019-07-14 DIAGNOSIS — T451X5A Adverse effect of antineoplastic and immunosuppressive drugs, initial encounter: Secondary | ICD-10-CM

## 2019-07-14 DIAGNOSIS — E6609 Other obesity due to excess calories: Secondary | ICD-10-CM

## 2019-07-14 DIAGNOSIS — M545 Low back pain: Secondary | ICD-10-CM

## 2019-07-14 DIAGNOSIS — G608 Other hereditary and idiopathic neuropathies: Secondary | ICD-10-CM

## 2019-07-14 DIAGNOSIS — R7303 Prediabetes: Secondary | ICD-10-CM

## 2019-07-14 DIAGNOSIS — J449 Chronic obstructive pulmonary disease, unspecified: Secondary | ICD-10-CM

## 2019-07-14 DIAGNOSIS — G8929 Other chronic pain: Secondary | ICD-10-CM

## 2019-07-14 DIAGNOSIS — H3530 Unspecified macular degeneration: Secondary | ICD-10-CM

## 2019-07-14 DIAGNOSIS — I739 Peripheral vascular disease, unspecified: Secondary | ICD-10-CM

## 2019-07-14 DIAGNOSIS — I10 Essential (primary) hypertension: Secondary | ICD-10-CM

## 2019-07-14 MED ORDER — LISINOPRIL 10 MG PO TABS: 10.0000 mg | ORAL_TABLET | Freq: Every evening | ORAL | 3 refills | Status: DC

## 2019-07-14 MED ORDER — METOPROLOL TARTRATE 25 MG PO TABS: 12.5000 mg | ORAL_TABLET | Freq: Two times a day (BID) | ORAL | 3 refills | Status: DC

## 2019-07-14 NOTE — Assessment & Plan Note (Signed)
Preventative protocols reviewed and updated unless pt declined. Discussed healthy diet and lifestyle.  

## 2019-07-14 NOTE — Assessment & Plan Note (Signed)
Consider aspirin, statin

## 2019-07-14 NOTE — Assessment & Plan Note (Signed)
On gabapentin managed by neurology.

## 2019-07-14 NOTE — Assessment & Plan Note (Signed)
Chronic, stable. Continue current regimen. 

## 2019-07-14 NOTE — Assessment & Plan Note (Signed)
Off meds. Sugars now in prediabetes range - will change Dx.

## 2019-07-14 NOTE — Assessment & Plan Note (Signed)
Continue b12 replacement.

## 2019-07-14 NOTE — Assessment & Plan Note (Signed)
Chronic, mildly elevated off statin. Consider statin.  The 10-year ASCVD risk score Mikey Bussing DC Brooke Bonito., et al., 2013) is: 59.6%   Values used to calculate the score:     Age: 79 years     Sex: Male     Is Non-Hispanic African American: No     Diabetic: Yes     Tobacco smoker: No     Systolic Blood Pressure: 795 mmHg     Is BP treated: Yes     HDL Cholesterol: 48.4 mg/dL     Total Cholesterol: 210 mg/dL

## 2019-07-14 NOTE — Assessment & Plan Note (Addendum)
Encouraged he try taper off oxymetazoline nasal decongestant (chronic use for 40+ yrs) Suggested chlidren's allegra or claritin - he is hesitant for this as he states all antihistamines cause sedation.

## 2019-07-14 NOTE — Assessment & Plan Note (Signed)
Unable to complete cologuard. Will defer for now.
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
## 2019-07-14 NOTE — Assessment & Plan Note (Addendum)
Weight gain noted - he is motivated to continue healthy diet changes to affect sustainable weight loss. States goal weight is 165lbs

## 2019-07-14 NOTE — Assessment & Plan Note (Signed)
Stable period on chronic supplemental oxygen.

## 2019-07-14 NOTE — Assessment & Plan Note (Signed)
Appreciate onc care.  

## 2019-07-14 NOTE — Assessment & Plan Note (Signed)
Remains abstinent 

## 2019-07-14 NOTE — Assessment & Plan Note (Signed)
Consider aspirin, statin.

## 2019-07-14 NOTE — Assessment & Plan Note (Signed)
Hydrocodone managed by oncologist.  He asks about [place] taking over this prescription.  I suggested he talk with pain management about medication - he states pain management prefers not to prescribe pain medicine.  Discussed our office policy of seeing him Q3 mo for chronic pain management - he may establish with [place] in he future.

## 2019-07-14 NOTE — Assessment & Plan Note (Signed)
Due for eye exam (postponed due to pandemic) - will call to schedule

## 2019-07-14 NOTE — Assessment & Plan Note (Signed)
S/p resection 2018 but needs continued surveillance - f/u postponed due to pandemic.

## 2019-07-28 ENCOUNTER — Telehealth: Payer: Self-pay | Admitting: Family Medicine

## 2019-07-28 NOTE — Chronic Care Management (AMB) (Signed)
  Chronic Care Management   Note  07/28/2019 Name: Joseph Graham MRN: [id number] DOB: 1941-03-01  Joseph Graham is a 79 y.o. year old male who is a primary care patient of [name], MD. I reached out to [name] by phone today in response to a referral sent by Joseph Graham's PCP, [name], MD.   Joseph Graham was given information about Chronic Care Management services today including:  1. CCM service includes personalized support from designated clinical staff supervised by his physician, including individualized plan of care and coordination with other care providers 2. 24/7 contact phone numbers for assistance for urgent and routine care needs. 3. Service will only be billed when office clinical staff spend 20 minutes or more in a month to coordinate care. 4. Only one practitioner may furnish and bill the service in a calendar month. 5. The patient may stop CCM services at any time (effective at the end of the month) by phone call to the office staff. 6. The patient will be responsible for cost sharing (co-pay) of up to 20% of the service fee (after annual deductible is met).  Patient agreed to services and verbal consent obtained.   Follow up plan:   Raynicia Dukes [employer] Scheduler

## 2019-07-30 ENCOUNTER — Ambulatory Visit: Payer: Medicare Other | Admitting: Pain Medicine

## 2019-08-03 ENCOUNTER — Ambulatory Visit: Payer: Medicare Other

## 2019-08-03 ENCOUNTER — Telehealth: Payer: Self-pay

## 2019-08-03 ENCOUNTER — Other Ambulatory Visit: Payer: Self-pay

## 2019-08-03 DIAGNOSIS — M51369 Other intervertebral disc degeneration, lumbar region without mention of lumbar back pain or lower extremity pain: Secondary | ICD-10-CM

## 2019-08-03 DIAGNOSIS — J449 Chronic obstructive pulmonary disease, unspecified: Secondary | ICD-10-CM

## 2019-08-03 DIAGNOSIS — M47816 Spondylosis without myelopathy or radiculopathy, lumbar region: Secondary | ICD-10-CM

## 2019-08-03 DIAGNOSIS — G250 Essential tremor: Secondary | ICD-10-CM

## 2019-08-03 DIAGNOSIS — R0902 Hypoxemia: Secondary | ICD-10-CM

## 2019-08-03 DIAGNOSIS — R7303 Prediabetes: Secondary | ICD-10-CM

## 2019-08-03 DIAGNOSIS — I2510 Atherosclerotic heart disease of native coronary artery without angina pectoris: Secondary | ICD-10-CM

## 2019-08-03 DIAGNOSIS — M5136 Other intervertebral disc degeneration, lumbar region: Secondary | ICD-10-CM

## 2019-08-03 DIAGNOSIS — E782 Mixed hyperlipidemia: Secondary | ICD-10-CM

## 2019-08-03 DIAGNOSIS — I739 Peripheral vascular disease, unspecified: Secondary | ICD-10-CM

## 2019-08-03 DIAGNOSIS — G894 Chronic pain syndrome: Secondary | ICD-10-CM

## 2019-08-03 DIAGNOSIS — J310 Chronic rhinitis: Secondary | ICD-10-CM

## 2019-08-03 DIAGNOSIS — G620 Drug-induced polyneuropathy: Secondary | ICD-10-CM

## 2019-08-03 DIAGNOSIS — E538 Deficiency of other specified B group vitamins: Secondary | ICD-10-CM

## 2019-08-03 DIAGNOSIS — I10 Essential (primary) hypertension: Secondary | ICD-10-CM

## 2019-08-03 NOTE — Patient Instructions (Addendum)
August 03, 2019  Dear Joseph Graham,  It was a pleasure meeting you during our initial appointment on August 03, 2019. Below is a summary of the goals we discussed and components of chronic care management. Please contact me anytime with questions or concerns.   Visit Information  Goals Addressed            This Visit's Progress   . COMPLETED: Patient Stated       Starting 07/04/2018, I will continue to take medications as prescribed.     . Patient Stated   On track    07/10/2019, I will maintain and continue medications as prescribed.     . Pharmacy Care Plan       Current Barriers:  . Chronic Disease Management support, education, and care coordination needs related to hypertension, peripheral artery disease, coronary artery disease, COPD, history of lung cancer, peripheral neuropathy, benign essential tremor, osteoarthritis, chronic pain syndrome, pre-diabetes, hyperlipidemia, B12 deficiency   Pharmacist Clinical Goal(s):  . Reduce cardiovascular risk. Recommend starting aspirin 81 mg daily and statin for cholesterol. . Reduce rebound nasal congestion with oxymetazoline. Recommend starting fluticasone nasal spray 50 mcg, 2 sprays in each nostril once daily for 1 week, then reduce Afrin from twice daily to daily for 1 week, then stop Afrin and continue fluticasone. Nasal congestion may worsen initially and may take several months to resolve. . Remain up to date on vaccinations. Recommend the shingles (Shingrix) vaccine.  Interventions: . Comprehensive medication review performed.  Patient Self Care Activities:  . Self administers medications as prescribed  Initial goal documentation       Joseph Graham was given information about Chronic Care Management services today including:  1. CCM service includes personalized support from designated clinical staff supervised by his physician, including individualized plan of care and coordination with other care providers 2. 24/7  contact phone numbers for assistance for urgent and routine care needs. 3. Service will only be billed when office clinical staff spend 20 minutes or more in a month to coordinate care. 4. Only one practitioner may furnish and bill the service in a calendar month. 5. The patient may stop CCM services at any time (effective at the end of the month) by phone call to the office staff. 6. The patient will be responsible for cost sharing (co-pay) of up to 20% of the service fee (after annual deductible is met).  Patient agreed to services and verbal consent obtained.   Telephone follow up appointment with pharmacy team member scheduled for: 08/31/19 at 10:30 AM for cholesterol check in  Debbora Dus, PharmD Clinical Pharmacist [facility] at [facility] [phone]   Aspirin and Your Heart  Aspirin is a medicine that prevents the cells in the blood that are used for clotting, called platelets, from sticking together. Aspirin can be used to help reduce the risk of blood clots, heart attacks, and other heart-related problems. Can I take aspirin? Your health care provider will help you determine whether it is safe and beneficial for you to take aspirin daily. Taking aspirin daily may be helpful if you:  Have had a heart attack or chest pain.  Are at risk for a heart attack.  Have undergone open-heart surgery, such as coronary artery bypass surgery (CABG).  Have had coronary angioplasty or a stent.  Have had certain types of stroke or transient ischemic attack (TIA).  Have peripheral artery disease (PAD).  Have chronic heart rhythm problems such as atrial fibrillation and  cannot take an anticoagulant.  Have valve disease or have had surgery on a valve. What are the risks? Daily use of aspirin can cause side effects. Some of these include:  Bleeding. Bleeding problems can be minor or serious. An example of a minor problem is a cut that does not stop bleeding. An example of a  more serious problem is stomach bleeding or, rarely, bleeding into the brain. Your risk of bleeding is increased if you are also taking non-steroidal anti-inflammatory drugs (NSAIDs).  Increased bruising.  Upset stomach.  An allergic reaction. People who have nasal polyps have an increased risk of developing an aspirin allergy. General guidelines  Take aspirin only as told by your health care provider. Make sure that you understand how much you should take and what form you should take. The two forms of aspirin are: ? Non-enteric-coated.This type of aspirin does not have a coating and is absorbed quickly. This type of aspirin also comes in a chewable form. ? Enteric-coated. This type of aspirin has a coating that releases the medicine very slowly. Enteric-coated aspirin might cause less stomach upset than non-enteric-coated aspirin. This type of aspirin should not be chewed or crushed.  Limit alcohol intake to no more than 1 drink a day for nonpregnant women and 2 drinks a day for men. Drinking alcohol increases your risk of bleeding. One drink equals 12 oz of beer, 5 oz of wine, or 1 oz of hard liquor. Contact a health care provider if you:  Have unusual bleeding or bruising.  Have stomach pain or nausea.  Have ringing in your ears.  Have an allergic reaction that causes: ? Hives. ? Itchy skin. ? Swelling of the lips, tongue, or face. Get help right away if you:  Notice that your bowel movements are bloody, dark red, or black in color.  Vomit or cough up blood.  Have blood in your urine.  Cough, have noisy breathing (wheeze), or feel short of breath.  Have chest pain, especially if the pain spreads to the arms, back, neck, or jaw.  Have a severe headache, or a headache with confusion, or dizziness. These symptoms may represent a serious problem that is an emergency. Do not wait to see if the symptoms will go away. Get medical help right away. Call your local emergency  services (911 in the [place] Do not drive yourself to the hospital. Summary  Aspirin can be used to help reduce the risk of blood clots, heart attacks, and other heart-related problems.  Daily use of aspirin can increase your risk of side effects. Your health care provider will help you determine whether it is safe and beneficial for you to take aspirin daily.  Take aspirin only as told by your health care provider. Make sure that you understand how much you can take and what form you can take. This information is not intended to replace advice given to you by your health care provider. Make sure you discuss any questions you have with your health care provider. Document Revised: 03/28/2017 Document Reviewed: 03/28/2017 Elsevier Patient Education  2020 [employer].
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
## 2019-08-03 NOTE — Telephone Encounter (Signed)
I would like to request a referral for Joseph Graham to chronic care management pharmacy services for the following conditions:   Essential hypertension, benign  [I10]  COPD [J44.9]  Debbora Dus, PharmD Clinical Pharmacist [facility] at [facility] [phone]

## 2019-08-04 ENCOUNTER — Other Ambulatory Visit: Payer: Self-pay

## 2019-08-04 ENCOUNTER — Ambulatory Visit (HOSPITAL_BASED_OUTPATIENT_CLINIC_OR_DEPARTMENT_OTHER): Payer: Medicare Other | Admitting: Pain Medicine

## 2019-08-04 ENCOUNTER — Encounter: Payer: Self-pay | Admitting: Pain Medicine

## 2019-08-04 ENCOUNTER — Ambulatory Visit
Admission: RE | Admit: 2019-08-04 | Discharge: 2019-08-04 | Disposition: A | Discharge status: Home / Self Care | Payer: Medicare Other | Source: Ambulatory Visit | Attending: Pain Medicine | Admitting: Pain Medicine

## 2019-08-04 VITALS — BP 123/61 | HR 64 | Temp 97.8°F | Resp 20 | Ht 69.0 in | Wt 186.0 lb

## 2019-08-04 DIAGNOSIS — M5137 Other intervertebral disc degeneration, lumbosacral region: Secondary | ICD-10-CM | POA: Diagnosis not present

## 2019-08-04 DIAGNOSIS — M533 Sacrococcygeal disorders, not elsewhere classified: Secondary | ICD-10-CM | POA: Diagnosis not present

## 2019-08-04 DIAGNOSIS — M488X8 Other specified spondylopathies, sacral and sacrococcygeal region: Secondary | ICD-10-CM | POA: Diagnosis not present

## 2019-08-04 MED ORDER — MIDAZOLAM HCL 5 MG/5ML IJ SOLN
1.0000 mg | INTRAMUSCULAR | Status: DC | PRN
  Administered 2019-08-04: 2 mg via INTRAVENOUS
  Filled 2019-08-04: qty 5

## 2019-08-04 MED ORDER — LACTATED RINGERS IV SOLN
1000.0000 mL | Freq: Once | INTRAVENOUS | Status: AC
  Administered 2019-08-04: 1000 mL via INTRAVENOUS

## 2019-08-04 MED ORDER — FENTANYL CITRATE (PF) 100 MCG/2ML IJ SOLN
INTRAMUSCULAR | Status: AC
  Filled 2019-08-04: qty 2

## 2019-08-04 MED ORDER — LIDOCAINE HCL 2 % IJ SOLN
20.0000 mL | Freq: Once | INTRAMUSCULAR | Status: AC
  Administered 2019-08-04: 400 mg
  Filled 2019-08-04: qty 40

## 2019-08-04 MED ORDER — ROPIVACAINE HCL 2 MG/ML IJ SOLN
2.0000 mL | Freq: Once | INTRAMUSCULAR | Status: AC
  Administered 2019-08-04: 2 mL via EPIDURAL
  Filled 2019-08-04: qty 10

## 2019-08-04 MED ORDER — SODIUM CHLORIDE 0.9% FLUSH
2.0000 mL | Freq: Once | INTRAVENOUS | Status: AC
  Administered 2019-08-04: 2 mL

## 2019-08-04 MED ORDER — SODIUM CHLORIDE (PF) 0.9 % IJ SOLN
INTRAMUSCULAR | Status: AC
  Filled 2019-08-04: qty 10

## 2019-08-04 MED ORDER — TRIAMCINOLONE ACETONIDE 40 MG/ML IJ SUSP
40.0000 mg | Freq: Once | INTRAMUSCULAR | Status: AC
  Administered 2019-08-04: 40 mg
  Filled 2019-08-04: qty 1

## 2019-08-04 MED ORDER — IOHEXOL 180 MG/ML  SOLN
10.0000 mL | Freq: Once | INTRAMUSCULAR | Status: AC
  Administered 2019-08-04: 10 mL via EPIDURAL
  Filled 2019-08-04: qty 20

## 2019-08-04 NOTE — Patient Instructions (Signed)

## 2019-08-04 NOTE — Progress Notes (Signed)
PROVIDER NOTE: Information contained herein reflects review and annotations entered in association with encounter. Interpretation of such information and data should be left to medically-trained personnel. Information provided to patient can be located elsewhere in the medical record under "Patient Instructions". Document created using STT-dictation technology, any transcriptional errors that may result from process are unintentional.    Patient: Joseph Graham  Service Category: Procedure  Provider: [name], MD  DOB: 05/07/1941  DOS: 08/04/2019  Location: [facility]  MRN: [id number]  Setting: Ambulatory - outpatient  Referring Provider: [name], MD  Type: Established Patient  Specialty: Interventional Pain Management  PCP: [name], MD   Primary Reason for Visit: Interventional Pain Management Treatment. CC: Back Pain (lower)  Procedure:          Anesthesia, Analgesia, Anxiolysis:  Type: Palliative Epidural Steroid Injection #4  Region: Caudal Level: Sacrococcygeal   Laterality: Midline       Type: Moderate (Conscious) Sedation combined with Local Anesthesia Indication(s): Analgesia and Anxiety Route: Intravenous (IV) IV Access: Secured Sedation: Meaningful verbal contact was maintained at all times during the procedure  Local Anesthetic: Lidocaine 1-2%  Position: Prone   Indications: 1. 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 DDD (degenerative disc disease), lumbosacral   3. Other specified spondylopathies, sacral and sacrococcygeal region ([place])   4. Coccygodynia    Pain Score: Pre-procedure: 6 /10 Post-procedure: 2 /10   Pre-op Assessment:  Joseph Graham is a 79 y.o. (year old), male patient, seen today for interventional treatment. He  has a past surgical history that includes Hemorroidectomy (1982); A flutter ablation (03/2010); Cataract extraction; carotid US (03/2010); Colonoscopy (09/2011); Tonsillectomy  (1964); Electormagnetic navigation bronchoscopy (N/A, 03/02/2015); Transurethral resection of bladder tumor (N/A, 04/06/2015); Cystoscopy w/ retrogrades (Bilateral, 04/06/2015); urinary stent removal (04/14/15); Transurethral resection of bladder tumor (N/A, 07/13/2015); Portacath placement (N/A, 08/10/2015); Port-a-cath removal (Right, 04/03/2016); Polypectomy; Lung biopsy; and Hemi-microdiscectomy lumbar laminectomy level 1 (N/A, 01/26/2019). Joseph Graham has a current medication list which includes the following prescription(s): acetaminophen, albuterol, vitamin d-3, gabapentin, hydrocodone-acetaminophen, lisinopril, metoprolol tartrate, multivitamin with minerals, NON FORMULARY, omeprazole, oxygen-helium, oxymetazoline, polyethylene glycol, primidone, vitamin b-12, and vitamin e, and the following Facility-Administered Medications: midazolam. His primarily concern today is the Back Pain (lower)  Initial Vital Signs:  Pulse/HCG Rate: 64ECG Heart Rate: (!) 58 Temp: 98.1 F (36.7 C) Resp: 18 BP: (!) 141/41 SpO2: (wear o2 2L [place])  BMI: Estimated body mass index is 27.47 kg/m as calculated from the following:   Height as of this encounter: 5\' 9"  (1.753 m).   Weight as of this encounter: 186 lb (84.4 kg).  Risk Assessment: Allergies: Reviewed. He has No Known Allergies.  Allergy Precautions: None required Coagulopathies: Reviewed. None identified.  Blood-thinner therapy: None at this time Active Infection(s): Reviewed. None identified. Joseph Graham is afebrile  Site Confirmation: Joseph Graham was asked to confirm the procedure and laterality before marking the site Procedure checklist: Completed Consent: Before the procedure and under the influence of no sedative(s), amnesic(s), or anxiolytics, the patient was informed of the treatment options, risks and possible complications. To fulfill our ethical and legal obligations, as recommended by the American Medical Association's Code of Ethics, I have  informed the patient of my clinical impression; the nature and purpose of the treatment or procedure; the risks, benefits, and possible complications of the intervention; the alternatives, including doing nothing; the risk(s) and benefit(s) of the alternative treatment(s) or procedure(s); and the risk(s) and benefit(s)  of doing nothing. The patient was provided information about the general risks and possible complications associated with the procedure. These may include, but are not limited to: failure to achieve desired goals, infection, bleeding, organ or nerve damage, allergic reactions, paralysis, and death. In addition, the patient was informed of those risks and complications associated to Spine-related procedures, such as failure to decrease pain; infection (i.e.: Meningitis, epidural or intraspinal abscess); bleeding (i.e.: epidural hematoma, subarachnoid hemorrhage, or any other type of intraspinal or peri-dural bleeding); organ or nerve damage (i.e.: Any type of peripheral nerve, nerve root, or spinal cord injury) with subsequent damage to sensory, motor, and/or autonomic systems, resulting in permanent pain, numbness, and/or weakness of one or several areas of the body; allergic reactions; (i.e.: anaphylactic reaction); and/or death. Furthermore, the patient was informed of those risks and complications associated with the medications. These include, but are not limited to: allergic reactions (i.e.: anaphylactic or anaphylactoid reaction(s)); adrenal axis suppression; blood sugar elevation that in diabetics may result in ketoacidosis or comma; water retention that in patients with history of congestive heart failure may result in shortness of breath, pulmonary edema, and decompensation with resultant heart failure; weight gain; swelling or edema; medication-induced neural toxicity; particulate matter embolism and blood vessel occlusion with resultant organ, and/or nervous system infarction; and/or  aseptic necrosis of one or more joints. Finally, the patient was informed that Medicine is not an exact science; therefore, there is also the possibility of unforeseen or unpredictable risks and/or possible complications that may result in a catastrophic outcome. The patient indicated having understood very clearly. We have given the patient no guarantees and we have made no promises. Enough time was given to the patient to ask questions, all of which were answered to the patient's satisfaction. Mr. Wadsworth has indicated that he wanted to continue with the procedure. Attestation: I, the ordering provider, attest that I have discussed with the patient the benefits, risks, side-effects, alternatives, likelihood of achieving goals, and potential problems during recovery for the procedure that I have provided informed consent. Date  Time: 08/04/2019  8:36 AM  Pre-Procedure Preparation:  Monitoring: As per clinic protocol. Respiration, ETCO2, SpO2, BP, heart rate and rhythm monitor placed and checked for adequate function Safety Precautions: Patient was assessed for positional comfort and pressure points before starting the procedure. Time-out: I initiated and conducted the "Time-out" before starting the procedure, as per protocol. The patient was asked to participate by confirming the accuracy of the "Time Out" information. Verification of the correct person, site, and procedure were performed and confirmed by me, the nursing staff, and the patient. "Time-out" conducted as per Joint Commission's Universal Protocol (UP.01.01.01). Time: 0933  Description of Procedure:          Target Area: Caudal Epidural Canal. Approach: Midline approach. Area Prepped: Entire Posterior Sacrococcygeal Region Prepping solution: DuraPrep (Iodine Povacrylex [0.7% available iodine] and Isopropyl Alcohol, 74% w/w) Safety Precautions: Aspiration looking for blood return was conducted prior to all injections. At no point did we  inject any substances, as a needle was being advanced. No attempts were made at seeking any paresthesias. Safe injection practices and needle disposal techniques used. Medications properly checked for expiration dates. SDV (single dose vial) medications used. Description of the Procedure: Protocol guidelines were followed. The patient was placed in position over the fluoroscopy table. The target area was identified and the area prepped in the usual manner. Skin & deeper tissues infiltrated with local anesthetic. Appropriate amount of time allowed to pass  for local anesthetics to take effect. The procedure needles were then advanced to the target area. Proper needle placement secured. Negative aspiration confirmed. Solution injected in intermittent fashion, asking for systemic symptoms every 0.5cc of injectate. The needles were then removed and the area cleansed, making sure to leave some of the prepping solution back to take advantage of its long term bactericidal properties. Vitals:   08/04/19 0940 08/04/19 0948 08/04/19 0958 08/04/19 1008  BP: (!) 110/45 (!) 128/58 (!) 131/49 123/61  Pulse:      Resp: 16 16 16 20   Temp:  97.8 F (36.6 C)    TempSrc:  Temporal    SpO2: 96% 90% 90% 92%  Weight:      Height:        Start Time: 0933 hrs. End Time: 0940 hrs. Materials:  Needle(s) Type: Epidural needle Gauge: 17G Length: 3.5-in Medication(s): Please see orders for medications and dosing details.  Imaging Guidance (Spinal):          Type of Imaging Technique: Fluoroscopy Guidance (Spinal) Indication(s): Assistance in needle guidance and placement for procedures requiring needle placement in or near specific anatomical locations not easily accessible without such assistance. Exposure Time: Please see nurses notes. Contrast: Before injecting any contrast, we confirmed that the patient did not have an allergy to iodine, shellfish, or radiological contrast. Once satisfactory needle placement was  completed at the desired level, radiological contrast was injected. Contrast injected under live fluoroscopy. No contrast complications. See chart for type and volume of contrast used. Fluoroscopic Guidance: I was personally present during the use of fluoroscopy. "Tunnel Vision Technique" used to obtain the best possible view of the target area. Parallax error corrected before commencing the procedure. "Direction-depth-direction" technique used to introduce the needle under continuous pulsed fluoroscopy. Once target was reached, antero-posterior, oblique, and lateral fluoroscopic projection used confirm needle placement in all planes. Images permanently stored in EMR. Interpretation: I personally interpreted the imaging intraoperatively. Adequate needle placement confirmed in multiple planes. Appropriate spread of contrast into desired area was observed. No evidence of afferent or efferent intravascular uptake. No intrathecal or subarachnoid spread observed. Permanent images saved into the patient's record.  Antibiotic Prophylaxis:   Anti-infectives (From admission, onward)   None     Indication(s): None identified  Post-operative Assessment:  Post-procedure Vital Signs:  Pulse/HCG Rate: 6462 Temp: 97.8 F (36.6 C) Resp: 20 BP: 123/61 SpO2: 92 %  EBL: None  Complications: No immediate post-treatment complications observed by team, or reported by patient.  Note: The patient tolerated the entire procedure well. A repeat set of vitals were taken after the procedure and the patient was kept under observation following institutional policy, for this type of procedure. Post-procedural neurological assessment was performed, showing return to baseline, prior to discharge. The patient was provided with post-procedure discharge instructions, including a section on how to identify potential problems. Should any problems arise concerning this procedure, the patient was given instructions to immediately  contact us, at any time, without hesitation. In any case, we plan to contact the patient by telephone for a follow-up status report regarding this interventional procedure.  Comments:  No additional relevant information.  Plan of Care  Orders:  Orders Placed This Encounter  Procedures  . Caudal Epidural Injection    Scheduling Instructions:     Laterality: Midline     Level(s): Sacrococcygeal canal (Tailbone area)     Sedation: Patient's choice     Timeframe: Today    Order Specific Question:  Where will this procedure be performed?    Answer:   [facility]  . DG PAIN [facility] C-ARM 1-60 MIN NO REPORT    Intraoperative interpretation by procedural physician at [place].    Standing Status:   Standing    Number of Occurrences:   1    Order Specific Question:   Reason for exam:    Answer:   Assistance in needle guidance and placement for procedures requiring needle placement in or near specific anatomical locations not easily accessible without such assistance.  . Informed Consent Details: Physician/Practitioner Attestation; Transcribe to consent form and obtain patient signature    Nursing Order: Transcribe to consent form and obtain patient signature. Note: Always confirm laterality of pain with Mr. Lonzo, before procedure. Procedure: Caudal epidural steroid injection Indication/Reason: Low back pain and lower extremity pain secondary to lumbosacral radiculitis Provider Attestation: I, [place] Dossie Arbour, MD, (Pain Management Specialist), the physician/practitioner, attest that I have discussed with the patient the benefits, risks, side effects, alternatives, likelihood of achieving goals and potential problems during recovery for the procedure that I have provided informed consent.  . Provide equipment / supplies at bedside    Equipment required: Single use, disposable, "Epidural Tray" Epidural Catheter: NOT required    Standing Status:   Standing    Number  of Occurrences:   1    Order Specific Question:   Specify    Answer:   Epidural Tray   Chronic Opioid Analgesic:  No opioid analgesics prescribed by our practice. Hydrocodone/APAP 5/325 mg, 1 tab PO q 8 hrs (15 mg/day of hydrocodone) (Last prescribed on 02/18/2019, by Cammie Sickle, MD & Verlon Au, NP. MME/day:15mg /day.   Medications ordered for procedure: Meds ordered this encounter  Medications  . iohexol (OMNIPAQUE) 180 MG/ML injection 10 mL    Must be Myelogram-compatible. If not available, you may substitute with a water-soluble, non-ionic, hypoallergenic, myelogram-compatible radiological contrast medium.  Marland Kitchen lidocaine (XYLOCAINE) 2 % (with pres) injection 400 mg  . lactated ringers infusion 1,000 mL  . midazolam (VERSED) 5 MG/5ML injection 1-2 mg    Make sure Flumazenil is available in the pyxis when using this medication. If oversedation occurs, administer 0.2 mg IV over 15 sec. If after 45 sec no response, administer 0.2 mg again over 1 min; may repeat at 1 min intervals; not to exceed 4 doses (1 mg)  . sodium chloride flush (NS) 0.9 % injection 2 mL  . ropivacaine (PF) 2 mg/mL (0.2%) (NAROPIN) injection 2 mL  . triamcinolone acetonide (KENALOG-40) injection 40 mg   Medications administered: We administered iohexol, lidocaine, lactated ringers, midazolam, sodium chloride flush, ropivacaine (PF) 2 mg/mL (0.2%), and triamcinolone acetonide.  See the medical record for exact dosing, route, and time of administration.  Follow-up plan:   Return in about 2 weeks (around 08/18/2019) for (VV), (PP).       Interventional management options: Considering: Possible Racz procedure Diagnostic coccygeal injection DiagnosticrightL4-5 LESI Diagnostic right L3-4 LESI Diagnostic right L2 TFESI Diagnostic right L3 TFESI Diagnostic right L4 TFESI DiagnosticbilateralLSB Possible bilateral lumbar facetRFA Diagnostic bilateral S-Iblock Possible bilateral S-I  jointRFA   Palliative PRN treatment(s): Palliative midline caudal ESIs #3 (he typically gets 100% relief of the pain for about 3 months) Palliative bilateral lumbar facet blocks #2 (100/100/90/90) (helped w/ LE spasms & LBP)  Diagnosticleft L5 transverse process and sacrumpseudoarticulationinjection #2         Recent Visits Date Type Provider Dept  05/18/19 Telemedicine Dossie Arbour,  Beatriz Chancellor, MD [facility]  Showing recent visits within past 90 days and meeting all other requirements   Today's Visits Date Type Provider Dept  08/04/19 Procedure visit Milinda Pointer, MD [facility]  Showing today's visits and meeting all other requirements   Future Appointments Date Type Provider Dept  08/18/19 Appointment Milinda Pointer, MD [facility]  Showing future appointments within next 90 days and meeting all other requirements   Disposition: Discharge home  Discharge (Date  Time): 08/04/2019; 0920 hrs.   Primary Care Physician: [name], MD Location: [facility] Note by: [name], MD Date: 08/04/2019; Time: 10:54 AM  Disclaimer:  Medicine is not an Chief Strategy Officer. The only guarantee in medicine is that nothing is guaranteed. It is important to note that the decision to proceed with this intervention was based on the information collected from the patient. The Data and conclusions were drawn from the patient's questionnaire, the interview, and the physical examination. Because the information was provided in large part by the patient, it cannot be guaranteed that it has not been purposely or unconsciously manipulated. Every effort has been made to obtain as much relevant data as possible for this evaluation. It is important to note that the conclusions that lead to this procedure are derived in large part from the available data. Always take into account that the treatment will also be dependent on  availability of resources and existing treatment guidelines, considered by other Pain Management Practitioners as being common knowledge and practice, at the time of the intervention. For Medico-Legal purposes, it is also important to point out that variation in procedural techniques and pharmacological choices are the acceptable norm. The indications, contraindications, technique, and results of the above procedure should only be interpreted and judged by a Board-Certified Interventional Pain Specialist with extensive familiarity and expertise in the same exact procedure and technique.

## 2019-08-11 ENCOUNTER — Other Ambulatory Visit: Payer: Self-pay

## 2019-08-12 ENCOUNTER — Other Ambulatory Visit: Payer: Self-pay

## 2019-08-12 ENCOUNTER — Inpatient Hospital Stay: Payer: Medicare Other | Attending: Internal Medicine

## 2019-08-12 ENCOUNTER — Ambulatory Visit
Admission: RE | Admit: 2019-08-12 | Discharge: 2019-08-12 | Disposition: A | Discharge status: Home / Self Care | Payer: Medicare Other | Source: Ambulatory Visit | Attending: Internal Medicine | Admitting: Internal Medicine

## 2019-08-12 DIAGNOSIS — C3411 Malignant neoplasm of upper lobe, right bronchus or lung: Secondary | ICD-10-CM | POA: Insufficient documentation

## 2019-08-12 DIAGNOSIS — Z79891 Long term (current) use of opiate analgesic: Secondary | ICD-10-CM | POA: Diagnosis not present

## 2019-08-12 LAB — CBC WITH DIFFERENTIAL/PLATELET
Abs Immature Granulocytes: 0.02 10*3/uL (ref 0.00–0.07)
Basophils Absolute: 0.1 10*3/uL (ref 0.0–0.1)
Basophils Relative: 1 %
Eosinophils Absolute: 0.2 10*3/uL (ref 0.0–0.5)
Eosinophils Relative: 3 %
HCT: 39.4 % (ref 39.0–52.0)
Hemoglobin: 12.5 g/dL — ABNORMAL LOW (ref 13.0–17.0)
Immature Granulocytes: 0 %
Lymphocytes Relative: 28 %
Lymphs Abs: 1.6 10*3/uL (ref 0.7–4.0)
MCH: 32.5 pg (ref 26.0–34.0)
MCHC: 31.7 g/dL (ref 30.0–36.0)
MCV: 102.3 fL — ABNORMAL HIGH (ref 80.0–100.0)
Monocytes Absolute: 0.6 10*3/uL (ref 0.1–1.0)
Monocytes Relative: 10 %
Neutro Abs: 3.3 10*3/uL (ref 1.7–7.7)
Neutrophils Relative %: 58 %
Platelets: 256 10*3/uL (ref 150–400)
RBC: 3.85 MIL/uL — ABNORMAL LOW (ref 4.22–5.81)
RDW: 13.1 % (ref 11.5–15.5)
WBC: 5.8 10*3/uL (ref 4.0–10.5)
nRBC: 0 % (ref 0.0–0.2)

## 2019-08-12 LAB — URINE DRUG SCREEN, QUALITATIVE (ARMC ONLY)
Amphetamines, Ur Screen: NOT DETECTED
Barbiturates, Ur Screen: POSITIVE — AB
Benzodiazepine, Ur Scrn: NOT DETECTED
Cannabinoid 50 Ng, Ur ~~LOC~~: NOT DETECTED
Cocaine Metabolite,Ur ~~LOC~~: NOT DETECTED
MDMA (Ecstasy)Ur Screen: NOT DETECTED
Methadone Scn, Ur: NOT DETECTED
Opiate, Ur Screen: POSITIVE — AB
Phencyclidine (PCP) Ur S: NOT DETECTED
Tricyclic, Ur Screen: NOT DETECTED

## 2019-08-12 LAB — COMPREHENSIVE METABOLIC PANEL
ALT: 17 U/L (ref 0–44)
AST: 19 U/L (ref 15–41)
Albumin: 4 g/dL (ref 3.5–5.0)
Alkaline Phosphatase: 93 U/L (ref 38–126)
Anion gap: 9 (ref 5–15)
BUN: 22 mg/dL (ref 8–23)
CO2: 29 mmol/L (ref 22–32)
Calcium: 8.9 mg/dL (ref 8.9–10.3)
Chloride: 99 mmol/L (ref 98–111)
Creatinine, Ser: 0.61 mg/dL (ref 0.61–1.24)
GFR calc Af Amer: >60 mL/min (ref >60)
GFR calc non Af Amer: >60 mL/min (ref >60)
Glucose, Bld: 111 mg/dL — ABNORMAL HIGH (ref 70–99)
Potassium: 4.6 mmol/L (ref 3.5–5.1)
Sodium: 137 mmol/L (ref 135–145)
Total Bilirubin: 0.5 mg/dL (ref 0.3–1.2)
Total Protein: 8 g/dL (ref 6.5–8.1)

## 2019-08-12 MED ORDER — IOHEXOL 300 MG/ML  SOLN
75.0000 mL | Freq: Once | INTRAMUSCULAR | Status: AC | PRN
  Administered 2019-08-12: 75 mL via INTRAVENOUS

## 2019-08-13 ENCOUNTER — Encounter: Payer: Self-pay | Admitting: Internal Medicine

## 2019-08-13 NOTE — Progress Notes (Signed)
Contacted patient prior to his virtual visit with Dr. Rogue Bussing - he is requesting a RF on norco.

## 2019-08-14 ENCOUNTER — Inpatient Hospital Stay (HOSPITAL_BASED_OUTPATIENT_CLINIC_OR_DEPARTMENT_OTHER): Payer: Medicare Other | Admitting: Internal Medicine

## 2019-08-14 DIAGNOSIS — C3411 Malignant neoplasm of upper lobe, right bronchus or lung: Secondary | ICD-10-CM

## 2019-08-14 DIAGNOSIS — C3491 Malignant neoplasm of unspecified part of right bronchus or lung: Secondary | ICD-10-CM

## 2019-08-14 DIAGNOSIS — T451X5A Adverse effect of antineoplastic and immunosuppressive drugs, initial encounter: Secondary | ICD-10-CM | POA: Diagnosis not present

## 2019-08-14 DIAGNOSIS — G620 Drug-induced polyneuropathy: Secondary | ICD-10-CM

## 2019-08-14 MED ORDER — HYDROCODONE-ACETAMINOPHEN 5-325 MG PO TABS: 1.0000 | ORAL_TABLET | Freq: Three times a day (TID) | ORAL | 0 refills | Status: DC | PRN

## 2019-08-14 NOTE — Assessment & Plan Note (Addendum)
#   Right UL POORLY DIFF CA- T3 N0  Currently status post radiation [finished feb 2017];  Finished adjuvant chemotherapy [end of April 2017].  CT scan August 12, 2019-stable  # a] RML ~1cm/b] RLL-1.3cm Slight growth over 4 years-currently stable compared to previous scan September 2020.  I would recommend continued surveillance.  # LUL ~4 mm located nodule [previously 2 mm]-needs close monitoring.    # Peripheral neuropathy- STABLE; continue  hydrocodone q 8 hours prn.   # COPD on O2/nasal cannula.  Stable  #Back pain status post surgery/ Knee pain- arthitis/peripheral neuropathy-on hydrocodone up to 2 a day.  New prescription filled.  # Bladder cancer- superficial-CT abdomen pelvis no evidence of any bladder lesions.   # DISPOSITION:  # follow up in 6 months- MD-labs-cbc/cmp;CT chest prior Dr.B

## 2019-08-17 ENCOUNTER — Encounter: Payer: Self-pay | Admitting: Pain Medicine

## 2019-08-17 NOTE — Progress Notes (Signed)
Pain relief after procedure (treated area only): (Questions asked to patient) 1. Starting about 15 minutes after the procedure, and "while the area was still numb" (from the local anesthetics), were you having any of your usual pain "in that area" (the treated area)?  (NOTE: NOT including the discomfort from the needle sticks.) First 1 hour:does not remember better. First 4-6 hours: does not remember better. 2. How long did the numbness from the local anesthetics last? (More than 6 hours?) Duration:does not rememberhours.  3. How much better is your pain now, when compared to before the procedure? Current benefit:80 % better. 4. Can you move better now? Improvement in ROM (Range of Motion): Yes. 5. Can you do more now? Improvement in function: Yes. 4. Did you have any problems with the procedure? Side-effects/Complications: No.

## 2019-08-17 NOTE — Progress Notes (Signed)
I connected with Joseph Graham on 08/14/2019 at  2:00 PM EST by video enabled telemedicine visit and verified that I am speaking with the correct person using two identifiers.  I discussed the limitations, risks, security and privacy concerns of performing an evaluation and management service by telemedicine and the availability of in-person appointments. I also discussed with the patient that there may be a patient responsible charge related to this service. The patient expressed understanding and agreed to proceed.    Other persons participating in the visit and their role in the encounter: RN/medical reconciliation Patient's location: home Provider's location: office  Oncology History Overview Note   # OCT 2016- RUL POORLY DIFF CA [clinically non-small cell] ; STAGE IIB [T3-2 separate nodules in RUL; N0] [s/p CT guided bx]- s/p RT [finished mid jan 2017]; FEB 2017-CT scan- regression of RUL nodules;   # March 2017- Start carbo-taxol q 3 w x4; June 2017- CT- Improvement of RUL nodule/ Stable RUL nodules. OCT 2017- PR   # OCT 2016- 2 sub cm nodules in Right LL- Feb CT 2017- STABLE; June 2017- resolved  # May- 2017- G2 PN  # OCT 2016-NON-invasive bladder urothelial cancer; high grade [Dr. [name]; s/p TURBT [Feb 2017- NEG]; declined BCG  #   # Pneumothorax [oct 2016 s/p CT Bx];  # MOLECULAR STUDIES- PDL-1 by IHC/keytruda- 11-20%.  -----------------------------------------    DIAGNOSIS: RUL Lung cancer  STAGE:   II ; GOALS: control  CURRENT/MOST RECENT THERAPY: Surveillance      Malignant neoplasm of right upper lobe of lung ([place])  12/20/2015 Initial Diagnosis   Malignant neoplasm of right upper lobe of lung ([place])      Chief Complaint: lung cancer    History of present illness:Joseph Graham 79 y.o.  male with history of stage II right lung cancer; approximately 1 cm ipsilateral nodules is here for follow-up review results of the CT scan.  Patient continues to have  back pain knee pain and also history of neuropathy-from previous chemotherapy.  Continues with Norco 1 pill twice a day as needed.   Has chronic shortness of breath chronic cough.  Not any worse.  He continues with oxygen.  No recent admission to hospital.  Denies any worsening headaches or falls.  Observation/objective:  Assessment and plan: Malignant neoplasm of right upper lobe of lung ([place]) # Right UL POORLY DIFF CA- T3 N0  Currently status post radiation [finished feb 2017];  Finished adjuvant chemotherapy [end of April 2017].  CT scan August 12, 2019-stable  # a] RML ~1cm/b] RLL-1.3cm Slight growth over 4 years-currently stable compared to previous scan September 2020.  I would recommend continued surveillance.  # LUL ~4 mm located nodule [previously 2 mm]-needs close monitoring.    # Peripheral neuropathy- STABLE; continue  hydrocodone q 8 hours prn.   # COPD on O2/nasal cannula.  Stable  #Back pain status post surgery/ Knee pain- arthitis/peripheral neuropathy-on hydrocodone up to 2 a day.  New prescription filled.  # Bladder cancer- superficial-CT abdomen pelvis no evidence of any bladder lesions.   # DISPOSITION:  # follow up in 6 months- MD-labs-cbc/cmp;CT chest prior Dr.B   Follow-up instructions:  I discussed the assessment and treatment plan with the patient.  The patient was provided an opportunity to ask questions and all were answered.  The patient agreed with the plan and demonstrated understanding of instructions.  The patient was advised to call back or seek an in person evaluation if the symptoms worsen  or if the condition fails to improve as anticipated.  Dr. Charlaine Dalton [facility] at [facility] 08/17/2019 8:00 AM

## 2019-08-18 ENCOUNTER — Other Ambulatory Visit: Payer: Self-pay | Admitting: Pain Medicine

## 2019-08-18 ENCOUNTER — Other Ambulatory Visit: Payer: Self-pay

## 2019-08-18 ENCOUNTER — Ambulatory Visit: Payer: Medicare Other | Attending: Pain Medicine | Admitting: Pain Medicine

## 2019-08-18 DIAGNOSIS — G894 Chronic pain syndrome: Secondary | ICD-10-CM

## 2019-08-18 DIAGNOSIS — M533 Sacrococcygeal disorders, not elsewhere classified: Secondary | ICD-10-CM | POA: Diagnosis not present

## 2019-08-18 DIAGNOSIS — M5137 Other intervertebral disc degeneration, lumbosacral region: Secondary | ICD-10-CM | POA: Diagnosis not present

## 2019-08-18 DIAGNOSIS — M51379 Other intervertebral disc degeneration, lumbosacral region without mention of lumbar back pain or lower extremity pain: Secondary | ICD-10-CM

## 2019-08-18 DIAGNOSIS — M488X8 Other specified spondylopathies, sacral and sacrococcygeal region: Secondary | ICD-10-CM | POA: Diagnosis not present

## 2019-08-18 DIAGNOSIS — M159 Polyosteoarthritis, unspecified: Secondary | ICD-10-CM | POA: Insufficient documentation

## 2019-08-18 DIAGNOSIS — M47816 Spondylosis without myelopathy or radiculopathy, lumbar region: Secondary | ICD-10-CM | POA: Diagnosis not present

## 2019-08-18 DIAGNOSIS — M150 Primary generalized (osteo)arthritis: Secondary | ICD-10-CM

## 2019-08-18 DIAGNOSIS — M8949 Other hypertrophic osteoarthropathy, multiple sites: Secondary | ICD-10-CM

## 2019-08-18 MED ORDER — TURMERIC 500 MG PO CAPS: 500.0000 mg | ORAL_CAPSULE | Freq: Every day | ORAL | 0 refills | Status: DC

## 2019-08-18 MED ORDER — MELOXICAM 15 MG PO TABS: 15.0000 mg | ORAL_TABLET | Freq: Every day | ORAL | 0 refills | Status: DC

## 2019-08-18 NOTE — Patient Instructions (Signed)
Meloxicam capsules What is this medicine? MELOXICAM (mel OX i cam) is a non-steroidal anti-inflammatory drug (NSAID). It is used to reduce swelling and to treat pain. It is used for osteoarthritis. This medicine may be used for other purposes; ask your health care provider or pharmacist if you have questions. COMMON BRAND NAME(S): Vivlodex What should I tell my health care provider before I take this medicine? They need to know if you have any of these conditions:  bleeding disorders  cigarette smoker  coronary artery bypass graft (CABG) surgery within the past 2 weeks  drink more than 3 alcohol-containing drinks per day  heart disease  high blood pressure  history of stomach bleeding  kidney disease  liver disease  lung or breathing disease, like asthma  stomach or intestine problems  an unusual or allergic reaction to meloxicam, aspirin, other NSAIDs, other medicines, foods, dyes, or preservatives  pregnant or trying to get pregnant  breast-feeding How should I use this medicine? Take this medicine by mouth with a full glass of water. Follow the directions on the prescription label. You can take it with or without food. If it upsets your stomach, take it with food. Take your medicine at regular intervals. Do not take it more often than directed. Do not stop taking except on your doctor's advice. A special MedGuide will be given to you by the pharmacist with each prescription and refill. Be sure to read this information carefully each time. Talk to your pediatrician regarding the use of this medicine in children. Special care may be needed. Patients over 91 years old may have a stronger reaction and need a smaller dose. Overdosage: If you think you have taken too much of this medicine contact a poison control center or emergency room at once. NOTE: This medicine is only for you. Do not share this medicine with others. What if I miss a dose? If you miss a dose, take it as  soon as you can. If it is almost time for your next dose, take only that dose. Do not take double or extra doses. What may interact with this medicine? Do not take this medicine with any of the following medications:  cidofovir  ketorolac This medicine may also interact with the following medications:  aspirin and aspirin-like medicines  certain medicines for blood pressure, heart disease, irregular heart beat  certain medicines for depression, anxiety, or psychotic disturbances  certain medicines that treat or prevent blood clots like warfarin, enoxaparin, dalteparin, apixaban, dabigatran, rivaroxaban  cyclosporine  diuretics  fluconazole  lithium  methotrexate  other NSAIDs, medicines for pain and inflammation, like ibuprofen and naproxen  pemetrexed This list may not describe all possible interactions. Give your health care provider a list of all the medicines, herbs, non-prescription drugs, or dietary supplements you use. Also tell them if you smoke, drink alcohol, or use illegal drugs. Some items may interact with your medicine. What should I watch for while using this medicine? Tell your doctor or healthcare provider if your symptoms do not start to get better or if they get worse. This medicine may cause serious skin reactions. They can happen weeks to months after starting the medicine. Contact your healthcare provider right away if you notice fevers or flu-like symptoms with a rash. The rash may be red or purple and then turn into blisters or peeling of the skin. Or, you might notice a red rash with swelling of the face, lips or lymph nodes in your neck or under your  arms. Do not take other medicines that contain aspirin, ibuprofen, or naproxen with this medicine. Side effects such as stomach upset, nausea, or ulcers may be more likely to occur. Many medicines available without a prescription should not be taken with this medicine. This medicine can cause ulcers and  bleeding in the stomach and intestines at any time during treatment. This can happen with no warning and may cause death. There is increased risk with taking this medicine for a long time. Smoking, drinking alcohol, older age, and poor health can also increase risks. Call your doctor right away if you have stomach pain or blood in your vomit or stool. This medicine does not prevent heart attack or stroke. In fact, this medicine may increase the chance of a heart attack or stroke. The chance may increase with longer use of this medicine and in people who have heart disease. If you take aspirin to prevent heart attack or stroke, talk with your doctor or healthcare provider. What side effects may I notice from receiving this medicine? Side effects that you should report to your doctor or health care professional as soon as possible:  allergic reactions like skin rash, itching or hives, swelling of the face, lips, or tongue  nausea, vomiting  redness, blistering, peeling, or loosening of the skin, including inside the mouth  signs and symptoms of a blood clot such as breathing problems; changes in vision; chest pain; severe, sudden headache; pain, swelling, warmth in the leg; trouble speaking; sudden numbness or weakness of the face, arm, or leg  signs and symptoms of bleeding such as bloody or black, tarry stools; red or dark-brown urine; spitting up blood or brown material that looks like coffee grounds; red spots on the skin; unusual bruising or bleeding from the eye, gums, or nose  signs and symptoms of liver injury like dark yellow or brown urine; general ill feeling or flu-like symptoms; light-colored stools; loss of appetite; nausea; right upper belly pain; unusually weak or tired; yellowing of the eyes or skin  signs and symptoms of stroke like changes in vision; confusion; trouble speaking or understanding; severe headaches; sudden numbness or weakness of the face, arm, or leg; trouble walking;  dizziness; loss of balance or coordination Side effects that usually do not require medical attention (report to your doctor or health care professional if they continue or are bothersome):  constipation  diarrhea  gas This list may not describe all possible side effects. Call your doctor for medical advice about side effects. You may report side effects to FDA at 1-800-FDA-1088. Where should I keep my medicine? Keep out of the reach of children. Store at room temperature between 15 and 30 degrees C (59 and 86 degrees F). Throw away any unused medicine after the expiration date. NOTE: This sheet is a summary. It may not cover all possible information. If you have questions about this medicine, talk to your doctor, pharmacist, or health care provider.  2020 Elsevier/Gold Standard (2018-08-27 11:19:03) __________________________________________________________________________________________________________________

## 2019-08-18 NOTE — Progress Notes (Signed)
Patient: Joseph Graham  Service Category: E/M  Provider: [name], MD  DOB: September 30, 1940  DOS: 08/18/2019  Location: Office  MRN: [id number]  Setting: Ambulatory outpatient  Referring Provider: [name], MD  Type: Established Patient  Specialty: Interventional Pain Management  PCP: [name], MD  Location: Remote location  Delivery: TeleHealth     Virtual Encounter - Pain Management PROVIDER NOTE: Information contained herein reflects review and annotations entered in association with encounter. Interpretation of such information and data should be left to medically-trained personnel. Information provided to patient can be located elsewhere in the medical record under "Patient Instructions". Document created using STT-dictation technology, any transcriptional errors that may result from process are unintentional.    Contact & Pharmacy Preferred: [phone] Home: [phone] (home) Mobile: [phone] (mobile) E-mail: [email or web address]  [id number] - [place], [place] - [address] [place] [address] Phone: [phone] Fax: [phone]   Pre-screening  Joseph Graham offered "in-person" vs "virtual" encounter. He indicated preferring virtual for this encounter.   Reason COVID-19*  Social distancing based on CDC and AMA recommendations.   I contacted Joseph Graham on 08/18/2019 via telephone.      I clearly identified myself as [name], MD. I verified that I was speaking with the correct person using two identifiers (Name: Joseph Graham, and date of birth: February 09, 1941).  Consent I sought verbal advanced consent from Joseph Graham for virtual visit interactions. I informed Joseph Graham of possible security and privacy concerns, risks, and limitations associated with providing "not-in-person" medical evaluation and management services. I also informed Mr.  Graham of the availability of "in-person" appointments. Finally, I informed him that there would be a charge for the virtual visit and that he could be  personally, fully or partially, financially responsible for it. Joseph Graham expressed understanding and agreed to proceed.   Historic Elements   Joseph Graham is a 79 y.o. year old, male patient evaluated today after his last contact with our practice on 08/04/2019. Joseph Graham  has a past medical history of Alcohol abuse, in remission, Arthritis, Benign essential tremor, BPH (benign prostatic hypertrophy) (12/30/2012), Cardiac arrhythmia (03/2010), Cataract, Chemotherapy-induced neutropenia ([place]) (09/23/2015), Chronic low back pain, Colon polyps, Complication of anesthesia (age 1-23), COPD (chronic obstructive pulmonary disease) ([place]), Coronary artery disease, Dyslipidemia, GERD (gastroesophageal reflux disease), History of kidney stones, HTN (hypertension), Macular degeneration, bilateral, Multiple pulmonary nodules (02/2015), Neuromuscular disorder ([place]), Peripheral neuropathy due to chemotherapy ([place]) (12/23/2015), Personal history of tobacco use, presenting hazards to health (02/18/2015), Pneumothorax after biopsy (04/01/2015), Pre-diabetes, Primary cancer of bladder ([place]) (2016), Primary lung cancer ([place]) (2016), and Shortness of breath dyspnea. He also  has a past surgical history that includes Hemorroidectomy (1982); A flutter ablation (03/2010); Cataract extraction; carotid US (03/2010); Colonoscopy (09/2011); Tonsillectomy (1964); Electormagnetic navigation bronchoscopy (N/A, 03/02/2015); Transurethral resection of bladder tumor (N/A, 04/06/2015); Cystoscopy w/ retrogrades (Bilateral, 04/06/2015); urinary stent removal (04/14/15); Transurethral resection of bladder tumor (N/A, 07/13/2015); Portacath placement (N/A, 08/10/2015); Port-a-cath removal (Right, 04/03/2016); Polypectomy; Lung biopsy; and Hemi-microdiscectomy lumbar laminectomy level 1 (N/A,  01/26/2019). Joseph Graham has a current medication list which includes the following prescription(s): acetaminophen, albuterol, vitamin d-3, gabapentin, hydrocodone-acetaminophen, lisinopril, metoprolol tartrate, multivitamin with minerals, NON FORMULARY, omeprazole, oxygen-helium, oxymetazoline, polyethylene glycol, primidone, vitamin b-12, vitamin e, meloxicam, and turmeric. He  reports that he quit smoking about 4 years ago. His smoking use included cigarettes. He  has a 91.50 pack-year smoking history. He has never used smokeless tobacco. He reports that he does not drink alcohol or use drugs. Joseph Graham has No Known Allergies.   HPI  Today, he is being contacted for a post-procedure assessment.  He indicated having waited too long for this last injection because of the Covid vaccine.  However he now has both and he is doing much better.  Because of the amount of pain that he was in before having this last caudal epidural steroid injection, he indicates being able to tell a striking difference between before and after the procedure.  Currently he is doing much better but as an added benefit, he says that the steroids from the caudal epidural have also helped his knee pain and joint pain in several places.  In view of this, today I have reviewed his medications and he is currently not taking any type of nonsteroidal anti-inflammatory drugs.  Review of his lab work shows adequate renal and hepatic function.  Today I have offered to write a prescription for Mobic, so that he can try this and see if any chance it improves some of his pain.  He is still taking the gabapentin and I have also sent a prescription for OTC turmeric, which is also a natural anti-inflammatory.  We will do a trial of these two medications and I will see him back in one month to see how he is doing.  If they helped him, we can do refills on those.  Post-Procedure Evaluation  Procedure (08/04/2019): Palliative midline caudal ESI #4  under fluoroscopic guidance and IV sedation Pre-procedure pain level:  6/10 Post-procedure: 2/10 (> 50% relief)  Sedation: Sedation provided.  Joseph Martins, RN  08/17/2019 12:49 PM  Sign when Signing Visit Pain relief after procedure (treated area only): (Questions asked to patient) 1. Starting about 15 minutes after the procedure, and "while the area was still numb" (from the local anesthetics), were you having any of your usual pain "in that area" (the treated area)?  (NOTE: NOT including the discomfort from the needle sticks.) First 1 hour: does not remember better. First 4-6 hours: does not remember better. 2. How long did the numbness from the local anesthetics last? (More than 6 hours?) Duration: does not remember hours.  3. How much better is your pain now, when compared to before the procedure? Current benefit: 80 % better. 4. Can you move better now? Improvement in ROM (Range of Motion): Yes. 5. Can you do more now? Improvement in function: Yes. 4. Did you have any problems with the procedure? Side-effects/Complications: No.  Current benefits: Defined as benefit that persist at this time.   Analgesia:  >75% relief Function: Mr. Mandala reports improvement in function ROM: Mr. Wishon reports improvement in ROM  Pharmacotherapy Assessment  Analgesic: No opioid analgesics prescribed by our practice. Hydrocodone/APAP 5/325 mg, 1 tab PO q 8 hrs (15 mg/day of hydrocodone) (Last prescribed on 02/18/2019, by Cammie Sickle, MD & Verlon Au, NP. MME/day: 47m/day.   Monitoring: [place] PMP: PDMP reviewed during this encounter.       Pharmacotherapy: No side-effects or adverse reactions reported. Compliance: No problems identified. Effectiveness: Clinically acceptable. Plan: Refer to "POC".  UDS:  Summary  Date Value Ref Range Status  07/14/2018 FINAL  Final    Comment:    ==================================================================== TOXASSURE COMP DRUG  ANALYSIS,UR ==================================================================== Test  Result       Flag       Units Drug Present and Declared for Prescription Verification   Hydrocodone                    185          EXPECTED   ng/mg creat   Hydromorphone                  207          EXPECTED   ng/mg creat   Norhydrocodone                 563          EXPECTED   ng/mg creat    Sources of hydrocodone include scheduled prescription    medications. Hydromorphone and norhydrocodone are expected    metabolites of hydrocodone. Hydromorphone is also available as a    scheduled prescription medication.   Primidone                      PRESENT      EXPECTED   Phenobarbital                  PRESENT      EXPECTED    Phenobarbital is an expected metabolite of primidone;    Phenobarbital may also be administered as a prescription drug.   Gabapentin                     PRESENT      EXPECTED   Acetaminophen                  PRESENT      EXPECTED Drug Absent but Declared for Prescription Verification   Metoprolol                     Not Detected UNEXPECTED ==================================================================== Test                      Result    Flag   Units      Ref Range   Creatinine              96               mg/dL      >=20 ==================================================================== Declared Medications:  The flagging and interpretation on this report are based on the  following declared medications.  Unexpected results may arise from  inaccuracies in the declared medications.  **Note: The testing scope of this panel includes these medications:  Gabapentin  Hydrocodone (Hydrocodone-Acetaminophen)  Metoprolol (Lopressor)  Primidone (Mysoline)  **Note: The testing scope of this panel does not include small to  moderate amounts of these reported medications:  Acetaminophen  Acetaminophen (Hydrocodone-Acetaminophen)  **Note: The testing  scope of this panel does not include following  reported medications:  Cannabidiol  Cholecalciferol  Cyanocobalamin  Lisinopril  Multivitamin  Omeprazole (Prilosec)  Polyethylene Glycol  Vitamin E ==================================================================== For clinical consultation, please call [phone]. ====================================================================    Laboratory Chemistry Profile   Renal Lab Results  Component Value Date   BUN 22 08/12/2019   CREATININE 0.61 08/12/2019   BCR 35 (H) 07/14/2018   GFR 116.42 07/10/2019   GFRAA >60 08/12/2019   GFRNONAA >60 08/12/2019    Hepatic Lab Results  Component Value Date   AST 19 08/12/2019   ALT 17 08/12/2019  ALBUMIN 4.0 08/12/2019   ALKPHOS 93 08/12/2019    Electrolytes Lab Results  Component Value Date   NA 137 08/12/2019   K 4.6 08/12/2019   CL 99 08/12/2019   CALCIUM 8.9 08/12/2019   MG 2.0 07/14/2018    Bone Lab Results  Component Value Date   25OHVITD1 30 07/14/2018   25OHVITD2 <1.0 07/14/2018   25OHVITD3 30 07/14/2018    Inflammation (CRP: Acute Phase) (ESR: Chronic Phase) Lab Results  Component Value Date   CRP 3 07/14/2018   ESRSEDRATE 51 (H) 07/14/2018      Note: Above Lab results reviewed.  Imaging  CT Chest W Contrast CLINICAL DATA:  Restaging right upper lobe lung cancer. Status post chemo and XRT.  EXAM: CT CHEST WITH CONTRAST  TECHNIQUE: Multidetector CT imaging of the chest was performed during intravenous contrast administration.  CONTRAST:  84m OMNIPAQUE IOHEXOL 300 MG/ML  SOLN  COMPARISON:  03/09/2019  FINDINGS: Cardiovascular: Mild cardiac enlargement. Aortic atherosclerosis. Left main, lad, RCA coronary artery calcifications noted.  Mediastinum/Nodes: Small low-density nodules within the thyroid gland measure up to 7 mm. The trachea appears patent and is midline. No mediastinal or hilar adenopathy identified.  Lungs/Pleura: Advanced  centrilobular and paraseptal emphysema identified. Post treatment changes within the right upper lobe noted compatible with changes due to external beam radiation.  Treated lung lesion within the right upper lobe measures 1.5 x 1.2 cm, image 31/3. previously 1.9 x 1.2 cm  Index nodule within the right upper lobe measures 4 mm, image 78/3. Previously this measured 4 mm.  The irregular nodule within the posterior right middle lobe measures 1 cm, image 84/3. Unchanged from previous exam.  Posterior right lower lobe lung nodule measures 1.3 cm, image 96/3. Unchanged.  New cluster of tree-in-bud nodules within the posteromedial right costophrenic sulcus are identified, image 128/3. Likely postinflammatory or infectious.  4 mm spiculated nodule is identified in the posterior left upper lobe, image 38/3. Previously 2 mm. Indeterminate.  Upper Abdomen: 8 mm anterior dome of right lobe of liver hypodensity is stable. The adrenal glands are unremarkable. No acute abnormality.  Musculoskeletal: No chest wall abnormality. No acute or significant osseous findings.  IMPRESSION: 1. Stable to slight decrease in size of treated right upper lobe lung lesion. 2. Additional lung nodules within the right lung are not significantly changed in size compared with 03/09/2019. 3. 4 mm irregular nodule in the posterior left upper lobe is noted. On the previous exam this measured 2 mm. This is indeterminate but worthy of attention on follow-up imaging. 4. New cluster of tree-in-bud nodules within the posteromedial right costophrenic sulcus is favored to represent postinflammatory or infectious process. 5. Emphysema and aortic atherosclerosis. 6. Left main, lad, RCA coronary artery calcifications.  Aortic Atherosclerosis (ICD10-I70.0) and Emphysema (ICD10-J43.9).  Electronically Signed   By: TKerby MoorsM.D.   On: 08/12/2019 13:36  Assessment  The primary encounter diagnosis was Chronic  coccyx pain (Secondary Area of Pain) (Midline). Diagnoses of DDD (degenerative disc disease), lumbosacral, Other specified spondylopathies, sacral and sacrococcygeal region ([facility], Coccygodynia, Chronic pain syndrome, Osteoarthritis of facet joint of lumbar spine, and Osteoarthritis involving multiple joints were also pertinent to this visit.  Plan of Care  Problem-specific:  No problem-specific Assessment & Plan notes found for this encounter.  Mr. JDILLINGER ASTONhas a current medication list which includes the following long-term medication(s): albuterol, gabapentin, lisinopril, metoprolol tartrate, and primidone.  Pharmacotherapy (Medications Ordered): Meds ordered this encounter  Medications  . meloxicam (  MOBIC) 15 MG tablet    Sig: Take 1 tablet (15 mg total) by mouth daily.    Dispense:  30 tablet    Refill:  0    Do not add this medication to the electronic "Automatic Refill" notification system. Patient may have prescription filled one day early if pharmacy is closed on scheduled refill date.  . Turmeric 500 MG CAPS    Sig: Take 500 mg by mouth daily.    Dispense:  90 capsule    Refill:  0    OTC Recommendation. Please provide information on: where to find; how to take; side-effects; adverse reactions; drug-to-drug interactions; and contraindications. If unavailable, recommend similar substitute.   Orders:  No orders of the defined types were placed in this encounter.  Follow-up plan:   Return in about 1 month (around 09/18/2019) for (VV), (MM) to evaluate meloxicam and turmeric trial.      Interventional management options: Considering: Possible Racz procedure Diagnostic coccygeal injection DiagnosticrightL4-5 LESI Diagnostic right L3-4 LESI Diagnostic right L2 TFESI Diagnostic right L3 TFESI Diagnostic right L4 TFESI DiagnosticbilateralLSB Possible bilateral lumbar facetRFA Diagnostic bilateral S-Iblock Possible bilateral S-I jointRFA    Palliative PRN treatment(s): Palliative midline caudal ESIs #3 (he typically gets 100% relief of the pain for about 3 months) Palliative bilateral lumbar facet blocks #2 (100/100/90/90) (helped w/ LE spasms & LBP)  Diagnosticleft L5 transverse process and sacrumpseudoarticulationinjection #2     Recent Visits Date Type Provider Dept  08/04/19 Procedure visit Milinda Pointer, MD [facility]  Showing recent visits within past 90 days and meeting all other requirements   Today's Visits Date Type Provider Dept  08/18/19 Telemedicine Milinda Pointer, MD [facility]  Showing today's visits and meeting all other requirements   Future Appointments No visits were found meeting these conditions.  Showing future appointments within next 90 days and meeting all other requirements   I discussed the assessment and treatment plan with the patient. The patient was provided an opportunity to ask questions and all were answered. The patient agreed with the plan and demonstrated an understanding of the instructions.  Patient advised to call back or seek an in-person evaluation if the symptoms or condition worsens.  Duration of encounter: 12 minutes.  Note by: [name], MD Date: 08/18/2019; Time: 12:36 PM

## 2019-08-19 LAB — OXYCODONES,MS,WB/SP RFX
Oxycocone: NEGATIVE ng/mL
Oxycodones Confirmation: NEGATIVE
Oxymorphone: NEGATIVE ng/mL

## 2019-08-25 LAB — BARBITURATES,MS,WB/SP RFX
Amobarbital: NEGATIVE ug/mL
Barbiturates Confirmation: POSITIVE
Butabarbital: NEGATIVE ug/mL
Butalbital: NEGATIVE ug/mL
Pentobarbital: NEGATIVE ug/mL
Phenobarbital: 8.7 ug/mL
Secobarbital: NEGATIVE ug/mL

## 2019-08-25 LAB — DRUG SCREEN 10 W/CONF, SERUM
Amphetamines, IA: NEGATIVE ng/mL
Barbiturates, IA: POSITIVE ug/mL — AB
Benzodiazepines, IA: NEGATIVE ng/mL
Cocaine & Metabolite, IA: NEGATIVE ng/mL
Methadone, IA: NEGATIVE ng/mL
Opiates, IA: NEGATIVE ng/mL
Oxycodones, IA: NEGATIVE ng/mL
Phencyclidine, IA: NEGATIVE ng/mL
Propoxyphene, IA: NEGATIVE ng/mL
THC(Marijuana) Metabolite, IA: NEGATIVE ng/mL

## 2019-08-31 ENCOUNTER — Telehealth: Payer: Medicare Other

## 2019-09-13 ENCOUNTER — Other Ambulatory Visit: Payer: Self-pay | Admitting: Pain Medicine

## 2019-09-13 DIAGNOSIS — M47816 Spondylosis without myelopathy or radiculopathy, lumbar region: Secondary | ICD-10-CM

## 2019-09-13 DIAGNOSIS — M159 Polyosteoarthritis, unspecified: Secondary | ICD-10-CM

## 2019-09-14 ENCOUNTER — Other Ambulatory Visit: Payer: Self-pay | Admitting: *Deleted

## 2019-09-14 DIAGNOSIS — G620 Drug-induced polyneuropathy: Secondary | ICD-10-CM

## 2019-09-14 DIAGNOSIS — C3491 Malignant neoplasm of unspecified part of right bronchus or lung: Secondary | ICD-10-CM

## 2019-09-14 DIAGNOSIS — C3411 Malignant neoplasm of upper lobe, right bronchus or lung: Secondary | ICD-10-CM

## 2019-09-14 MED ORDER — HYDROCODONE-ACETAMINOPHEN 5-325 MG PO TABS: 1.0000 | ORAL_TABLET | Freq: Three times a day (TID) | ORAL | 0 refills | Status: DC | PRN

## 2019-09-14 NOTE — Telephone Encounter (Signed)
Patient called requesting a refill of his Norco, but states he noted that we only gave him #60 tabs last refill and would like to get #90 tabs in case he needs to take more. Please advise

## 2019-09-16 ENCOUNTER — Encounter: Payer: Self-pay | Admitting: Pain Medicine

## 2019-09-17 ENCOUNTER — Ambulatory Visit: Payer: Medicare Other | Attending: Pain Medicine | Admitting: Pain Medicine

## 2019-09-17 ENCOUNTER — Other Ambulatory Visit: Payer: Self-pay

## 2019-09-17 DIAGNOSIS — M533 Sacrococcygeal disorders, not elsewhere classified: Secondary | ICD-10-CM

## 2019-09-17 DIAGNOSIS — M47816 Spondylosis without myelopathy or radiculopathy, lumbar region: Secondary | ICD-10-CM | POA: Diagnosis not present

## 2019-09-17 DIAGNOSIS — Q7649 Other congenital malformations of spine, not associated with scoliosis: Secondary | ICD-10-CM

## 2019-09-17 DIAGNOSIS — G894 Chronic pain syndrome: Secondary | ICD-10-CM

## 2019-09-17 DIAGNOSIS — M8949 Other hypertrophic osteoarthropathy, multiple sites: Secondary | ICD-10-CM

## 2019-09-17 DIAGNOSIS — M159 Polyosteoarthritis, unspecified: Secondary | ICD-10-CM

## 2019-09-17 DIAGNOSIS — M150 Primary generalized (osteo)arthritis: Secondary | ICD-10-CM

## 2019-09-17 DIAGNOSIS — M79604 Pain in right leg: Secondary | ICD-10-CM

## 2019-09-17 DIAGNOSIS — G8929 Other chronic pain: Secondary | ICD-10-CM

## 2019-09-17 DIAGNOSIS — M545 Low back pain: Secondary | ICD-10-CM | POA: Diagnosis not present

## 2019-09-17 DIAGNOSIS — M79605 Pain in left leg: Secondary | ICD-10-CM

## 2019-09-17 MED ORDER — TURMERIC 500 MG PO CAPS: 500.0000 mg | ORAL_CAPSULE | Freq: Every day | ORAL | 5 refills | Status: DC

## 2019-09-17 MED ORDER — MELOXICAM 15 MG PO TABS: 15.0000 mg | ORAL_TABLET | Freq: Every day | ORAL | 5 refills | Status: DC

## 2019-09-17 NOTE — Patient Instructions (Signed)
____________________________________________________________________________________________  Preparing for Procedure with Sedation  Procedure appointments are limited to planned procedures: . No Prescription Refills. . No disability issues will be discussed. . No medication changes will be discussed.  Instructions: . Oral Intake: Do not eat or drink anything for at least 3 hours prior to your procedure. (Exception: Blood Pressure Medication. See below.) . Transportation: Unless otherwise stated by your physician, you may drive yourself after the procedure. . Blood Pressure Medicine: Do not forget to take your blood pressure medicine with a sip of water the morning of the procedure. If your Diastolic (lower reading)is above 100 mmHg, elective cases will be cancelled/rescheduled. . Blood thinners: These will need to be stopped for procedures. Notify our staff if you are taking any blood thinners. Depending on which one you take, there will be specific instructions on how and when to stop it. . Diabetics on insulin: Notify the staff so that you can be scheduled 1st case in the morning. If your diabetes requires high dose insulin, take only  of your normal insulin dose the morning of the procedure and notify the staff that you have done so. . Preventing infections: Shower with an antibacterial soap the morning of your procedure. . Build-up your immune system: Take 1000 mg of Vitamin C with every meal (3 times a day) the day prior to your procedure. . Antibiotics: Inform the staff if you have a condition or reason that requires you to take antibiotics before dental procedures. . Pregnancy: If you are pregnant, call and cancel the procedure. . Sickness: If you have a cold, fever, or any active infections, call and cancel the procedure. . Arrival: You must be in the facility at least 30 minutes prior to your scheduled procedure. . Children: Do not bring children with you. . Dress appropriately:  Bring dark clothing that you would not mind if they get stained. . Valuables: Do not bring any jewelry or valuables.  Reasons to call and reschedule or cancel your procedure: (Following these recommendations will minimize the risk of a serious complication.) . Surgeries: Avoid having procedures within 2 weeks of any surgery. (Avoid for 2 weeks before or after any surgery). . Flu Shots: Avoid having procedures within 2 weeks of a flu shots or . (Avoid for 2 weeks before or after immunizations). . Barium: Avoid having a procedure within 7-10 days after having had a radiological study involving the use of radiological contrast. (Myelograms, Barium swallow or enema study). . Heart attacks: Avoid any elective procedures or surgeries for the initial 6 months after a "Myocardial Infarction" (Heart Attack). . Blood thinners: It is imperative that you stop these medications before procedures. Let us know if you if you take any blood thinner.  . Infection: Avoid procedures during or within two weeks of an infection (including chest colds or gastrointestinal problems). Symptoms associated with infections include: Localized redness, fever, chills, night sweats or profuse sweating, burning sensation when voiding, cough, congestion, stuffiness, runny nose, sore throat, diarrhea, nausea, vomiting, cold or Flu symptoms, recent or current infections. It is specially important if the infection is over the area that we intend to treat. . Heart and lung problems: Symptoms that may suggest an active cardiopulmonary problem include: cough, chest pain, breathing difficulties or shortness of breath, dizziness, ankle swelling, uncontrolled high or unusually low blood pressure, and/or palpitations. If you are experiencing any of these symptoms, cancel your procedure and contact your primary care physician for an evaluation.  Remember:  Regular Business hours are:    Monday to Thursday 8:00 AM to 4:00 PM  Provider's  Schedule: Ambika Zettlemoyer, MD:  Procedure days: Tuesday and Thursday 7:30 AM to 4:00 PM  Bilal Lateef, MD:  Procedure days: Monday and Wednesday 7:30 AM to 4:00 PM ____________________________________________________________________________________________    

## 2019-09-17 NOTE — Progress Notes (Signed)
Patient: Joseph Graham  Service Category: E/M  Provider: [name], MD  DOB: 12-Feb-1941  DOS: 09/17/2019  Location: Office  MRN: [id number]  Setting: Ambulatory outpatient  Referring Provider: [name], MD  Type: Established Patient  Specialty: Interventional Pain Management  PCP: [name], MD  Location: Remote location  Delivery: TeleHealth     Virtual Encounter - Pain Management PROVIDER NOTE: Information contained herein reflects review and annotations entered in association with encounter. Interpretation of such information and data should be left to medically-trained personnel. Information provided to patient can be located elsewhere in the medical record under "Patient Instructions". Document created using STT-dictation technology, any transcriptional errors that may result from process are unintentional.    Contact & Pharmacy Preferred: [phone] Home: [phone] (home) Mobile: [phone] (mobile) E-mail: [email or web address]  [id number] - [place], [place] - [place] [address] [place] [address] Phone: [phone] Fax: [phone]   Pre-screening  Mr. Woody offered "in-person" vs "virtual" encounter. He indicated preferring virtual for this encounter.   Reason COVID-19*  Social distancing based on CDC and AMA recommendations.   I contacted [name] on 09/17/2019 via telephone.      I clearly identified myself as [name], MD. I verified that I was speaking with the correct person using two identifiers (Name: VEDANTH SIRICO, and date of birth: [birth year]).  Consent I sought verbal advanced consent from [name] for virtual visit interactions. I informed Mr. Shon of possible security and privacy concerns, risks, and limitations associated with providing "not-in-person" medical evaluation and management services. I also informed Mr.  Durnil of the availability of "in-person" appointments. Finally, I informed him that there would be a charge for the virtual visit and that he could be  personally, fully or partially, financially responsible for it. Mr. Devera expressed understanding and agreed to proceed.   Historic Elements   Mr. DASHAN CHIZMAR is a 79 y.o. year old, male patient evaluated today after his last contact with our practice on 09/13/2019. Mr. Sorter  has a past medical history of Alcohol abuse, in remission, Arthritis, Benign essential tremor, BPH (benign prostatic hypertrophy) (12/30/2012), Cardiac arrhythmia (03/2010), Cataract, Chemotherapy-induced neutropenia ([place]) (09/23/2015), Chronic low back pain, Colon polyps, Complication of anesthesia (age 68-23), COPD (chronic obstructive pulmonary disease) ([place]), Coronary artery disease, Dyslipidemia, GERD (gastroesophageal reflux disease), History of kidney stones, HTN (hypertension), Macular degeneration, bilateral, Multiple pulmonary nodules (02/2015), Neuromuscular disorder ([place]), Peripheral neuropathy due to chemotherapy ([place]) (12/23/2015), Personal history of tobacco use, presenting hazards to health (02/18/2015), Pneumothorax after biopsy (04/01/2015), Pre-diabetes, Primary cancer of bladder ([place]) (2016), Primary lung cancer ([place]) (2016), and Shortness of breath dyspnea. He also  has a past surgical history that includes Hemorroidectomy (1982); A flutter ablation (03/2010); Cataract extraction; carotid US (03/2010); Colonoscopy (09/2011); Tonsillectomy (1964); Electormagnetic navigation bronchoscopy (N/A, 03/02/2015); Transurethral resection of bladder tumor (N/A, 04/06/2015); Cystoscopy w/ retrogrades (Bilateral, 04/06/2015); urinary stent removal (04/14/15); Transurethral resection of bladder tumor (N/A, 07/13/2015); Portacath placement (N/A, 08/10/2015); Port-a-cath removal (Right, 04/03/2016); Polypectomy; Lung biopsy; and Hemi-microdiscectomy lumbar laminectomy level 1 (N/A,  01/26/2019). Mr. Demeo has a current medication list which includes the following prescription(s): acetaminophen, albuterol, vitamin d-3, gabapentin, hydrocodone-acetaminophen, lisinopril, meloxicam, metoprolol tartrate, multivitamin with minerals, NON FORMULARY, omeprazole, oxygen-helium, oxymetazoline, polyethylene glycol, primidone, turmeric, vitamin b-12, and vitamin e. He  reports that he quit smoking about 4 years ago. His smoking use included cigarettes. He  has a 91.50 pack-year smoking history. He has never used smokeless tobacco. He reports that he does not drink alcohol or use drugs. Mr. Lea has No Known Allergies.   HPI  Today, he is being contacted for medication management.  This encounter is primarily to evaluate the meloxicam and turmeric trials. The patient indicates doing well with the current medication regimen. No adverse reactions or side effects reported to the medications.  The patient indicates that the meloxicam and the turmeric worked great for his back pain and swelling.  He also indicated that he would like to continue with those but he is beginning to experience a little bit more of the lower extremity cramps that he used to have when he first came in.  He does not want to have any injections yet, because he is concerned and scared about sedation, but he recognizes that the bilateral lumbar facet blocks worked wonders in eliminating those back and lower extremity cramps for him.  He indicated that if they begin to come back in full force, he will give [place] a call so that we can repeat that injection for him.  I told him that that would be no problem.  Pharmacotherapy Assessment  Analgesic: No opioid analgesics prescribed by our practice. Hydrocodone/APAP 5/325 mg, 1 tab PO q 8 hrs (15 mg/day of hydrocodone) (Last prescribed on 02/18/2019, by Cammie Sickle, MD & Verlon Au, NP. MME/day: 32m/day.   Monitoring: [place] PMP: PDMP reviewed during this encounter.        Pharmacotherapy: No side-effects or adverse reactions reported. Compliance: No problems identified. Effectiveness: Clinically acceptable. Plan: Refer to "POC".  UDS:  Summary  Date Value Ref Range Status  07/14/2018 FINAL  Final    Comment:    ==================================================================== TOXASSURE COMP DRUG ANALYSIS,UR ==================================================================== Test                             Result       Flag       Units Drug Present and Declared for Prescription Verification   Hydrocodone                    185          EXPECTED   ng/mg creat   Hydromorphone                  207          EXPECTED   ng/mg creat   Norhydrocodone                 563          EXPECTED   ng/mg creat    Sources of hydrocodone include scheduled prescription    medications. Hydromorphone and norhydrocodone are expected    metabolites of hydrocodone. Hydromorphone is also available as a    scheduled prescription medication.   Primidone                      PRESENT      EXPECTED   Phenobarbital                  PRESENT      EXPECTED    Phenobarbital is an expected metabolite of primidone;    Phenobarbital may also be administered as a prescription drug.   Gabapentin  PRESENT      EXPECTED   Acetaminophen                  PRESENT      EXPECTED Drug Absent but Declared for Prescription Verification   Metoprolol                     Not Detected UNEXPECTED ==================================================================== Test                      Result    Flag   Units      Ref Range   Creatinine              96               mg/dL      >=20 ==================================================================== Declared Medications:  The flagging and interpretation on this report are based on the  following declared medications.  Unexpected results may arise from  inaccuracies in the declared medications.  **Note: The testing scope of  this panel includes these medications:  Gabapentin  Hydrocodone (Hydrocodone-Acetaminophen)  Metoprolol (Lopressor)  Primidone (Mysoline)  **Note: The testing scope of this panel does not include small to  moderate amounts of these reported medications:  Acetaminophen  Acetaminophen (Hydrocodone-Acetaminophen)  **Note: The testing scope of this panel does not include following  reported medications:  Cannabidiol  Cholecalciferol  Cyanocobalamin  Lisinopril  Multivitamin  Omeprazole (Prilosec)  Polyethylene Glycol  Vitamin E ==================================================================== For clinical consultation, please call [phone]. ====================================================================    Laboratory Chemistry Profile   Renal Lab Results  Component Value Date   BUN 22 08/12/2019   CREATININE 0.61 08/12/2019   BCR 35 (H) 07/14/2018   GFR 116.42 07/10/2019   GFRAA >60 08/12/2019   GFRNONAA >60 08/12/2019     Hepatic Lab Results  Component Value Date   AST 19 08/12/2019   ALT 17 08/12/2019   ALBUMIN 4.0 08/12/2019   ALKPHOS 93 08/12/2019     Electrolytes Lab Results  Component Value Date   NA 137 08/12/2019   K 4.6 08/12/2019   CL 99 08/12/2019   CALCIUM 8.9 08/12/2019   MG 2.0 07/14/2018     Bone Lab Results  Component Value Date   25OHVITD1 30 07/14/2018   25OHVITD2 <1.0 07/14/2018   25OHVITD3 30 07/14/2018     Inflammation (CRP: Acute Phase) (ESR: Chronic Phase) Lab Results  Component Value Date   CRP 3 07/14/2018   ESRSEDRATE 51 (H) 07/14/2018       Note: Above Lab results reviewed.  Imaging  CT Chest W Contrast CLINICAL DATA:  Restaging right upper lobe lung cancer. Status post chemo and XRT.  EXAM: CT CHEST WITH CONTRAST  TECHNIQUE: Multidetector CT imaging of the chest was performed during intravenous contrast administration.  CONTRAST:  57m OMNIPAQUE IOHEXOL 300 MG/ML  SOLN  COMPARISON:   03/09/2019  FINDINGS: Cardiovascular: Mild cardiac enlargement. Aortic atherosclerosis. Left main, lad, RCA coronary artery calcifications noted.  Mediastinum/Nodes: Small low-density nodules within the thyroid gland measure up to 7 mm. The trachea appears patent and is midline. No mediastinal or hilar adenopathy identified.  Lungs/Pleura: Advanced centrilobular and paraseptal emphysema identified. Post treatment changes within the right upper lobe noted compatible with changes due to external beam radiation.  Treated lung lesion within the right upper lobe measures 1.5 x 1.2 cm, image 31/3. previously 1.9 x 1.2 cm  Index nodule within the right upper lobe measures 4 mm, image  78/3. Previously this measured 4 mm.  The irregular nodule within the posterior right middle lobe measures 1 cm, image 84/3. Unchanged from previous exam.  Posterior right lower lobe lung nodule measures 1.3 cm, image 96/3. Unchanged.  New cluster of tree-in-bud nodules within the posteromedial right costophrenic sulcus are identified, image 128/3. Likely postinflammatory or infectious.  4 mm spiculated nodule is identified in the posterior left upper lobe, image 38/3. Previously 2 mm. Indeterminate.  Upper Abdomen: 8 mm anterior dome of right lobe of liver hypodensity is stable. The adrenal glands are unremarkable. No acute abnormality.  Musculoskeletal: No chest wall abnormality. No acute or significant osseous findings.  IMPRESSION: 1. Stable to slight decrease in size of treated right upper lobe lung lesion. 2. Additional lung nodules within the right lung are not significantly changed in size compared with 03/09/2019. 3. 4 mm irregular nodule in the posterior left upper lobe is noted. On the previous exam this measured 2 mm. This is indeterminate but worthy of attention on follow-up imaging. 4. New cluster of tree-in-bud nodules within the posteromedial right costophrenic sulcus is favored  to represent postinflammatory or infectious process. 5. Emphysema and aortic atherosclerosis. 6. Left main, lad, RCA coronary artery calcifications.  Aortic Atherosclerosis (ICD10-I70.0) and Emphysema (ICD10-J43.9).  Electronically Signed   By: Kerby Moors M.D.   On: 08/12/2019 13:36  Assessment  The primary encounter diagnosis was Chronic low back pain (Primary area of Pain) (Bilateral)  (L>R) w/o sciatica. Diagnoses of Chronic coccyx pain (Secondary Area of Pain) (Midline), Chronic lower extremity pain (Tertiary Area of Pain) (Bilateral) (R>L), Osteoarthritis of facet joint of lumbar spine, Bertolotti's syndrome (Left), Osteoarthritis involving multiple joints, and Chronic pain syndrome were also pertinent to this visit.  Plan of Care  Problem-specific:  No problem-specific Assessment & Plan notes found for this encounter.  Mr. CECILE GUEVARA has a current medication list which includes the following long-term medication(s): albuterol, gabapentin, lisinopril, meloxicam, metoprolol tartrate, primidone, and turmeric.  Pharmacotherapy (Medications Ordered): Meds ordered this encounter  Medications  . meloxicam (MOBIC) 15 MG tablet    Sig: Take 1 tablet (15 mg total) by mouth daily.    Dispense:  30 tablet    Refill:  5    Do not add this medication to the electronic "Automatic Refill" notification system. Patient may have prescription filled one day early if pharmacy is closed on scheduled refill date.  . Turmeric 500 MG CAPS    Sig: Take 500 mg by mouth daily.    Dispense:  30 capsule    Refill:  5    OTC Recommendation. Please provide information on: where to find; how to take; side-effects; adverse reactions; drug-to-drug interactions; and contraindications. If unavailable, recommend similar substitute.   Orders:  No orders of the defined types were placed in this encounter.  Follow-up plan:   Return in about 6 months (around 03/14/2020) for (F2F), (MM), in addition, PRN  Procedure(s): (B) L-FCT Blk #2.      Interventional management options: Considering: Possible Racz procedure Diagnostic coccygeal injection DiagnosticrightL4-5 LESI Diagnostic right L3-4 LESI Diagnostic right L2 TFESI Diagnostic right L3 TFESI Diagnostic right L4 TFESI DiagnosticbilateralLSB Possible bilateral lumbar facetRFA Diagnostic bilateral S-Iblock Possible bilateral S-I jointRFA   Palliative PRN treatment(s): Palliative midline caudal ESIs #3 (he typically gets 100% relief of the pain for about 3 months) Palliative bilateral lumbar facet blocks #2 (100/100/90/90) (helped w/ LE spasms/cramps & LBP)  Diagnosticleft L5 transverse process and sacrumpseudoarticulationinjection #2  Recent Visits Date Type Provider Dept  08/18/19 Telemedicine Milinda Pointer, MD [facility]  08/04/19 Procedure visit Milinda Pointer, MD [facility]  Showing recent visits within past 90 days and meeting all other requirements   Today's Visits Date Type Provider Dept  09/17/19 Telemedicine Milinda Pointer, MD [facility]  Showing today's visits and meeting all other requirements   Future Appointments No visits were found meeting these conditions.  Showing future appointments within next 90 days and meeting all other requirements   I discussed the assessment and treatment plan with the patient. The patient was provided an opportunity to ask questions and all were answered. The patient agreed with the plan and demonstrated an understanding of the instructions.  Patient advised to call back or seek an in-person evaluation if the symptoms or condition worsens.  Duration of encounter: 16 minutes.  Note by: [name], MD Date: 09/17/2019; Time: 3:09 PM

## 2019-10-02 ENCOUNTER — Other Ambulatory Visit: Payer: Self-pay | Admitting: Family Medicine

## 2019-10-27 ENCOUNTER — Other Ambulatory Visit: Payer: Self-pay | Admitting: *Deleted

## 2019-10-27 ENCOUNTER — Telehealth: Payer: Medicare Other

## 2019-10-27 DIAGNOSIS — C3411 Malignant neoplasm of upper lobe, right bronchus or lung: Secondary | ICD-10-CM

## 2019-10-27 DIAGNOSIS — C3491 Malignant neoplasm of unspecified part of right bronchus or lung: Secondary | ICD-10-CM

## 2019-10-27 DIAGNOSIS — T451X5A Adverse effect of antineoplastic and immunosuppressive drugs, initial encounter: Secondary | ICD-10-CM

## 2019-10-27 MED ORDER — HYDROCODONE-ACETAMINOPHEN 5-325 MG PO TABS: 1.0000 | ORAL_TABLET | Freq: Three times a day (TID) | ORAL | 0 refills | Status: DC | PRN

## 2019-10-27 NOTE — Telephone Encounter (Signed)
Patient called cancer center requesting refill of Norco 5-325mg  q 8 hours prn for pain.  As mandated by the [place] STOP Act (Strengthen Opioid Misuse Prevention), the [place] Controlled Substance Reporting System ([place]) was reviewed for this patient. Below is the past 64-months of controlled substance prescriptions as displayed by the registry. I have personally consulted with my supervising physician, Dr. Rogue Bussing, who agrees that continuation of opiate therapy is medically appropriate at this time and agrees to provide continual monitoring, including urine/blood drug screens, as indicated. Refill is appropriate on or after 10/14/2019.  [place]CCSRS reviewed: Reviewed and appropriate.     Faythe Casa, NP 10/27/2019 10:46 AM [phone]

## 2019-10-28 ENCOUNTER — Telehealth: Payer: Self-pay | Admitting: Family Medicine

## 2019-10-28 NOTE — Progress Notes (Signed)
  Chronic Care Management   Note  10/28/2019 Name: Joseph Graham MRN: [id number] DOB: Aug 27, 1940  Joseph Graham is a 79 y.o. year old male who is a primary care patient of [name], MD. I reached out to [name] by phone today in response to a referral sent by Joseph Graham's PCP, [name], MD.   Joseph Graham was given information about Chronic Care Management services today including:  1. CCM service includes personalized support from designated clinical staff supervised by his physician, including individualized plan of care and coordination with other care providers 2. 24/7 contact phone numbers for assistance for urgent and routine care needs. 3. Service will only be billed when office clinical staff spend 20 minutes or more in a month to coordinate care. 4. Only one practitioner may furnish and bill the service in a calendar month. 5. The patient may stop CCM services at any time (effective at the end of the month) by phone call to the office staff.   Patient wishes to consider information provided and/or speak with a member of the care team before deciding about enrollment in care management services.   This note is not being shared with the patient for the following reason: To respect privacy (The patient or proxy has requested that the information not be shared).  Follow up plan:   Earney Hamburg Upstream Scheduler

## 2019-11-05 ENCOUNTER — Other Ambulatory Visit: Payer: Self-pay

## 2019-11-05 ENCOUNTER — Ambulatory Visit (HOSPITAL_BASED_OUTPATIENT_CLINIC_OR_DEPARTMENT_OTHER): Payer: Medicare Other | Admitting: Pain Medicine

## 2019-11-05 ENCOUNTER — Ambulatory Visit
Admission: RE | Admit: 2019-11-05 | Discharge: 2019-11-05 | Disposition: A | Discharge status: Home / Self Care | Payer: Medicare Other | Source: Ambulatory Visit | Attending: Pain Medicine | Admitting: Pain Medicine

## 2019-11-05 ENCOUNTER — Encounter: Payer: Self-pay | Admitting: Pain Medicine

## 2019-11-05 VITALS — BP 125/49 | HR 60 | Temp 97.4°F | Resp 16 | Ht 69.0 in | Wt 179.0 lb

## 2019-11-05 DIAGNOSIS — G894 Chronic pain syndrome: Secondary | ICD-10-CM | POA: Diagnosis not present

## 2019-11-05 DIAGNOSIS — M79604 Pain in right leg: Secondary | ICD-10-CM | POA: Diagnosis not present

## 2019-11-05 DIAGNOSIS — G8929 Other chronic pain: Secondary | ICD-10-CM

## 2019-11-05 DIAGNOSIS — M545 Low back pain: Secondary | ICD-10-CM | POA: Insufficient documentation

## 2019-11-05 DIAGNOSIS — M533 Sacrococcygeal disorders, not elsewhere classified: Secondary | ICD-10-CM

## 2019-11-05 DIAGNOSIS — M5137 Other intervertebral disc degeneration, lumbosacral region: Secondary | ICD-10-CM | POA: Diagnosis not present

## 2019-11-05 DIAGNOSIS — M488X8 Other specified spondylopathies, sacral and sacrococcygeal region: Secondary | ICD-10-CM | POA: Insufficient documentation

## 2019-11-05 DIAGNOSIS — M79605 Pain in left leg: Secondary | ICD-10-CM | POA: Insufficient documentation

## 2019-11-05 DIAGNOSIS — Z79899 Other long term (current) drug therapy: Secondary | ICD-10-CM

## 2019-11-05 MED ORDER — LIDOCAINE HCL 2 % IJ SOLN
20.0000 mL | Freq: Once | INTRAMUSCULAR | Status: AC
  Administered 2019-11-05: 400 mg
  Filled 2019-11-05: qty 40

## 2019-11-05 MED ORDER — SODIUM CHLORIDE 0.9% FLUSH
2.0000 mL | Freq: Once | INTRAVENOUS | Status: AC
  Administered 2019-11-05: 2 mL

## 2019-11-05 MED ORDER — TRIAMCINOLONE ACETONIDE 40 MG/ML IJ SUSP
40.0000 mg | Freq: Once | INTRAMUSCULAR | Status: AC
  Administered 2019-11-05: 40 mg
  Filled 2019-11-05: qty 1

## 2019-11-05 MED ORDER — ROPIVACAINE HCL 2 MG/ML IJ SOLN
2.0000 mL | Freq: Once | INTRAMUSCULAR | Status: AC
  Administered 2019-11-05: 2 mL via EPIDURAL
  Filled 2019-11-05: qty 10

## 2019-11-05 MED ORDER — MIDAZOLAM HCL 5 MG/5ML IJ SOLN
1.0000 mg | INTRAMUSCULAR | Status: DC | PRN
  Administered 2019-11-05: 1 mg via INTRAVENOUS
  Filled 2019-11-05: qty 5

## 2019-11-05 MED ORDER — FENTANYL CITRATE (PF) 100 MCG/2ML IJ SOLN
25.0000 ug | INTRAMUSCULAR | Status: DC | PRN
  Administered 2019-11-05: 25 ug via INTRAVENOUS
  Filled 2019-11-05: qty 2

## 2019-11-05 MED ORDER — LACTATED RINGERS IV SOLN
1000.0000 mL | Freq: Once | INTRAVENOUS | Status: AC
  Administered 2019-11-05: 1000 mL via INTRAVENOUS

## 2019-11-05 NOTE — Progress Notes (Signed)
Safety precautions to be maintained throughout the outpatient stay will include: orient to surroundings, keep bed in low position, maintain call bell within reach at all times, provide assistance with transfer out of bed and ambulation.  

## 2019-11-05 NOTE — Progress Notes (Addendum)
PROVIDER NOTE: Information contained herein reflects review and annotations entered in association with encounter. Interpretation of such information and data should be left to medically-trained personnel. Information provided to patient can be located elsewhere in the medical record under "Patient Instructions". Document created using STT-dictation technology, any transcriptional errors that may result from process are unintentional.    Patient: Joseph Graham  Service Category: Procedure  Provider: [name], MD  DOB: 12/04/1940  DOS: 11/05/2019  Location: [facility]  MRN: [id number]  Setting: Ambulatory - outpatient  Referring Provider: [name], MD  Type: Established Patient  Specialty: Interventional Pain Management  PCP: [name], MD   Primary Reason for Visit: Interventional Pain Management Treatment. CC: Back Pain  Procedure:          Anesthesia, Analgesia, Anxiolysis:  Type: Palliative Epidural Steroid Injection #5  Region: Caudal Level: Sacrococcygeal   Laterality: Midline       Type: Moderate (Conscious) Sedation combined with Local Anesthesia Indication(s): Analgesia and Anxiety Route: Intravenous (IV) IV Access: Secured Sedation: Meaningful verbal contact was maintained at all times during the procedure  Local Anesthetic: Lidocaine 1-2%  Position: Prone   Indications: 1. DDD (degenerative disc disease), lumbosacral   2. Other specified spondylopathies, sacral and sacrococcygeal region ([place])   3. Coccygodynia   4. Chronic coccyx pain (Secondary Area of Pain) (Midline)   5. Chronic lower extremity pain (Tertiary Area of Pain) (Bilateral) (R>L)   6. Chronic low back pain (Primary area of Pain) (Bilateral)  (L>R) w/o sciatica    Pain Score: Pre-procedure: 3 /10 Post-procedure: 0-No pain/10   Pre-op Assessment:  Mr. Taddei is a 79 y.o. (year old), male patient, seen today for interventional treatment. He  has a past surgical  history that includes Hemorroidectomy (1982); A flutter ablation (03/2010); Cataract extraction; carotid US (03/2010); Colonoscopy (09/2011); Tonsillectomy (1964); Electormagnetic navigation bronchoscopy (N/A, 03/02/2015); Transurethral resection of bladder tumor (N/A, 04/06/2015); Cystoscopy w/ retrogrades (Bilateral, 04/06/2015); urinary stent removal (04/14/15); Transurethral resection of bladder tumor (N/A, 07/13/2015); Portacath placement (N/A, 08/10/2015); Port-a-cath removal (Right, 04/03/2016); Polypectomy; Lung biopsy; and Hemi-microdiscectomy lumbar laminectomy level 1 (N/A, 01/26/2019). Mr. Seidel has a current medication list which includes the following prescription(s): acetaminophen, albuterol, vitamin d-3, gabapentin, hydrocodone-acetaminophen, lisinopril, meloxicam, metoprolol tartrate, multivitamin with minerals, NON FORMULARY, omeprazole, oxygen-helium, oxymetazoline, polyethylene glycol, primidone, turmeric, vitamin b-12, and vitamin e, and the following Facility-Administered Medications: fentanyl and midazolam. His primarily concern today is the Back Pain  Initial Vital Signs:  Pulse/HCG Rate: 60ECG Heart Rate: (!) 57 Temp: 97.9 F (36.6 C) Resp: 16 BP: (!) 139/50 SpO2: 93 %(2 liters)  BMI: Estimated body mass index is 26.43 kg/m as calculated from the following:   Height as of this encounter: 5\' 9"  (1.753 m).   Weight as of this encounter: 179 lb (81.2 kg).  Risk Assessment: Allergies: Reviewed. He has No Known Allergies.  Allergy Precautions: None required Coagulopathies: Reviewed. None identified.  Blood-thinner therapy: None at this time Active Infection(s): Reviewed. None identified. Mr. Muto is afebrile  Site Confirmation: Mr. Granieri was asked to confirm the procedure and laterality before marking the site Procedure checklist: Completed Consent: Before the procedure and under the influence of no sedative(s), amnesic(s), or anxiolytics, the patient was informed of the  treatment options, risks and possible complications. To fulfill our ethical and legal obligations, as recommended by the American Medical Association's Code of Ethics, I have informed the patient of my clinical impression; the nature and purpose of the treatment or  procedure; the risks, benefits, and possible complications of the intervention; the alternatives, including doing nothing; the risk(s) and benefit(s) of the alternative treatment(s) or procedure(s); and the risk(s) and benefit(s) of doing nothing. The patient was provided information about the general risks and possible complications associated with the procedure. These may include, but are not limited to: failure to achieve desired goals, infection, bleeding, organ or nerve damage, allergic reactions, paralysis, and death. In addition, the patient was informed of those risks and complications associated to Spine-related procedures, such as failure to decrease pain; infection (i.e.: Meningitis, epidural or intraspinal abscess); bleeding (i.e.: epidural hematoma, subarachnoid hemorrhage, or any other type of intraspinal or peri-dural bleeding); organ or nerve damage (i.e.: Any type of peripheral nerve, nerve root, or spinal cord injury) with subsequent damage to sensory, motor, and/or autonomic systems, resulting in permanent pain, numbness, and/or weakness of one or several areas of the body; allergic reactions; (i.e.: anaphylactic reaction); and/or death. Furthermore, the patient was informed of those risks and complications associated with the medications. These include, but are not limited to: allergic reactions (i.e.: anaphylactic or anaphylactoid reaction(s)); adrenal axis suppression; blood sugar elevation that in diabetics may result in ketoacidosis or comma; water retention that in patients with history of congestive heart failure may result in shortness of breath, pulmonary edema, and decompensation with resultant heart failure; weight gain;  swelling or edema; medication-induced neural toxicity; particulate matter embolism and blood vessel occlusion with resultant organ, and/or nervous system infarction; and/or aseptic necrosis of one or more joints. Finally, the patient was informed that Medicine is not an exact science; therefore, there is also the possibility of unforeseen or unpredictable risks and/or possible complications that may result in a catastrophic outcome. The patient indicated having understood very clearly. We have given the patient no guarantees and we have made no promises. Enough time was given to the patient to ask questions, all of which were answered to the patient's satisfaction. Mr. Franklin has indicated that he wanted to continue with the procedure. Attestation: I, the ordering provider, attest that I have discussed with the patient the benefits, risks, side-effects, alternatives, likelihood of achieving goals, and potential problems during recovery for the procedure that I have provided informed consent. Date  Time: 11/05/2019  8:37 AM  Pre-Procedure Preparation:  Monitoring: As per clinic protocol. Respiration, ETCO2, SpO2, BP, heart rate and rhythm monitor placed and checked for adequate function Safety Precautions: Patient was assessed for positional comfort and pressure points before starting the procedure. Time-out: I initiated and conducted the "Time-out" before starting the procedure, as per protocol. The patient was asked to participate by confirming the accuracy of the "Time Out" information. Verification of the correct person, site, and procedure were performed and confirmed by me, the nursing staff, and the patient. "Time-out" conducted as per Joint Commission's Universal Protocol (UP.01.01.01). Time: 0935  Description of Procedure:          Target Area: Caudal Epidural Canal. Approach: Midline approach. Area Prepped: Entire Posterior Sacrococcygeal Region DuraPrep (Iodine Povacrylex [0.7% available  iodine] and Isopropyl Alcohol, 74% w/w) Safety Precautions: Aspiration looking for blood return was conducted prior to all injections. At no point did we inject any substances, as a needle was being advanced. No attempts were made at seeking any paresthesias. Safe injection practices and needle disposal techniques used. Medications properly checked for expiration dates. SDV (single dose vial) medications used. Description of the Procedure: Protocol guidelines were followed. The patient was placed in position over the fluoroscopy table.  The target area was identified and the area prepped in the usual manner. Skin & deeper tissues infiltrated with local anesthetic. Appropriate amount of time allowed to pass for local anesthetics to take effect. The procedure needles were then advanced to the target area. Proper needle placement secured. Negative aspiration confirmed. Solution injected in intermittent fashion, asking for systemic symptoms every 0.5cc of injectate. The needles were then removed and the area cleansed, making sure to leave some of the prepping solution back to take advantage of its long term bactericidal properties. Vitals:   11/05/19 0945 11/05/19 0955 11/05/19 1005 11/05/19 1015  BP: (!) 158/68 126/62 (!) 110/50 (!) 125/49  Pulse:      Resp: 16 14 12 16   Temp:  97.7 F (36.5 C)  (!) 97.4 F (36.3 C)  SpO2: 99% 99% 98% 100%  Weight:      Height:        Start Time: 0935 hrs. End Time: 0940 hrs. Materials:  Needle(s) Type: Epidural needle Gauge: 17G Length: 3.5-in Medication(s): Please see orders for medications and dosing details.  Imaging Guidance (Spinal):          Type of Imaging Technique: Fluoroscopy Guidance (Spinal) Indication(s): Assistance in needle guidance and placement for procedures requiring needle placement in or near specific anatomical locations not easily accessible without such assistance. Exposure Time: Please see nurses notes. Contrast: Before injecting any  contrast, we confirmed that the patient did not have an allergy to iodine, shellfish, or radiological contrast. Once satisfactory needle placement was completed at the desired level, radiological contrast was injected. Contrast injected under live fluoroscopy. No contrast complications. See chart for type and volume of contrast used. Fluoroscopic Guidance: I was personally present during the use of fluoroscopy. "Tunnel Vision Technique" used to obtain the best possible view of the target area. Parallax error corrected before commencing the procedure. "Direction-depth-direction" technique used to introduce the needle under continuous pulsed fluoroscopy. Once target was reached, antero-posterior, oblique, and lateral fluoroscopic projection used confirm needle placement in all planes. Images permanently stored in EMR. Interpretation: I personally interpreted the imaging intraoperatively. Adequate needle placement confirmed in multiple planes. Appropriate spread of contrast into desired area was observed. No evidence of afferent or efferent intravascular uptake. No intrathecal or subarachnoid spread observed. Permanent images saved into the patient's record.  Antibiotic Prophylaxis:   Anti-infectives (From admission, onward)   None     Indication(s): None identified  Post-operative Assessment:  Post-procedure Vital Signs:  Pulse/HCG Rate: 60(!) 53 Temp: (!) 97.4 F (36.3 C) Resp: 16 BP: (!) 125/49 SpO2: 100 %  EBL: None  Complications: No immediate post-treatment complications observed by team, or reported by patient.  Note: The patient tolerated the entire procedure well. A repeat set of vitals were taken after the procedure and the patient was kept under observation following institutional policy, for this type of procedure. Post-procedural neurological assessment was performed, showing return to baseline, prior to discharge. The patient was provided with post-procedure discharge  instructions, including a section on how to identify potential problems. Should any problems arise concerning this procedure, the patient was given instructions to immediately contact us, at any time, without hesitation. In any case, we plan to contact the patient by telephone for a follow-up status report regarding this interventional procedure.  Comments:  No additional relevant information.  Plan of Care  Orders:  Orders Placed This Encounter  Procedures  . Caudal Epidural Injection    Scheduling Instructions:     Laterality: Midline  Level(s): Sacrococcygeal canal (Tailbone area)     Sedation: Patient's choice     Timeframe: Today    Order Specific Question:   Where will this procedure be performed?    Answer:   [facility]  . [facility] C-ARM 1-60 MIN NO REPORT    Intraoperative interpretation by procedural physician at [place].    Standing Status:   Standing    Number of Occurrences:   1    Order Specific Question:   Reason for exam:    Answer:   Assistance in needle guidance and placement for procedures requiring needle placement in or near specific anatomical locations not easily accessible without such assistance.  . Informed Consent Details: Physician/Practitioner Attestation; Transcribe to consent form and obtain patient signature    Nursing Order: Transcribe to consent form and obtain patient signature. Note: Always confirm laterality of pain with Mr. Aldrete, before procedure. Procedure: Caudal epidural steroid injection Indication/Reason: Low back pain and lower extremity pain secondary to lumbosacral radiculitis Provider Attestation: I, [place] Dossie Arbour, MD, (Pain Management Specialist), the physician/practitioner, attest that I have discussed with the patient the benefits, risks, side effects, alternatives, likelihood of achieving goals and potential problems during recovery for the procedure that I have provided informed consent.  .  Provide equipment / supplies at bedside    Equipment required: Single use, disposable, "Epidural Tray" Epidural Catheter: NOT required    Standing Status:   Standing    Number of Occurrences:   1    Order Specific Question:   Specify    Answer:   Epidural Tray   Chronic Opioid Analgesic:  No opioid analgesics prescribed by our practice. Hydrocodone/APAP 5/325 mg, 1 tab PO q 8 hrs (15 mg/day of hydrocodone) (Last prescribed on 02/18/2019, by Cammie Sickle, MD & Verlon Au, NP. MME/day: 15mg /day.   Medications ordered for procedure: Meds ordered this encounter  Medications  . lidocaine (XYLOCAINE) 2 % (with pres) injection 400 mg  . lactated ringers infusion 1,000 mL  . midazolam (VERSED) 5 MG/5ML injection 1-2 mg    Make sure Flumazenil is available in the pyxis when using this medication. If oversedation occurs, administer 0.2 mg IV over 15 sec. If after 45 sec no response, administer 0.2 mg again over 1 min; may repeat at 1 min intervals; not to exceed 4 doses (1 mg)  . fentaNYL (SUBLIMAZE) injection 25-50 mcg    Make sure Narcan is available in the pyxis when using this medication. In the event of respiratory depression (RR< 8/min): Titrate NARCAN (naloxone) in increments of 0.1 to 0.2 mg IV at 2-3 minute intervals, until desired degree of reversal.  . sodium chloride flush (NS) 0.9 % injection 2 mL  . ropivacaine (PF) 2 mg/mL (0.2%) (NAROPIN) injection 2 mL  . triamcinolone acetonide (KENALOG-40) injection 40 mg   Medications administered: We administered lidocaine, lactated ringers, midazolam, fentaNYL, sodium chloride flush, ropivacaine (PF) 2 mg/mL (0.2%), and triamcinolone acetonide.  See the medical record for exact dosing, route, and time of administration.  Follow-up plan:   Return in about 2 weeks (around 11/19/2019) for (VV), (PP).       Interventional management options: Considering: Possible Racz procedure Diagnostic coccygeal  injection DiagnosticrightL4-5 LESI Diagnostic right L3-4 LESI Diagnostic right L2 TFESI Diagnostic right L3 TFESI Diagnostic right L4 TFESI DiagnosticbilateralLSB Possible bilateral lumbar facetRFA Diagnostic bilateral S-Iblock Possible bilateral S-I jointRFA   Palliative PRN treatment(s): Palliative midline caudal ESIs #3 (he typically gets 100%  relief of the pain for about 3 months) Palliative bilateral lumbar facet blocks #2 (100/100/90/90) (helped w/ LE spasms/cramps & LBP)  Diagnosticleft L5 transverse process and sacrumpseudoarticulationinjection #2      Recent Visits Date Type Provider Dept  09/17/19 Telemedicine Milinda Pointer, [facility]  08/18/19 Telemedicine Milinda Pointer, MD [facility]  Showing recent visits within past 90 days and meeting all other requirements   Today's Visits Date Type Provider Dept  11/05/19 Procedure visit Milinda Pointer, MD [facility]  Showing today's visits and meeting all other requirements   Future Appointments Date Type Provider Dept  11/19/19 Appointment Milinda Pointer, MD [facility]  Showing future appointments within next 90 days and meeting all other requirements   Disposition: Discharge home  Discharge (Date  Time): 11/05/2019; 1020 hrs.   Primary Care Physician: [name], MD Location: [facility] Note by: [name], MD Date: 11/05/2019; Time: 1:57 PM  Disclaimer:  Medicine is not an Chief Strategy Officer. The only guarantee in medicine is that nothing is guaranteed. It is important to note that the decision to proceed with this intervention was based on the information collected from the patient. The Data and conclusions were drawn from the patient's questionnaire, the interview, and the physical examination. Because the information was provided in large part by the patient, it cannot be guaranteed that it  has not been purposely or unconsciously manipulated. Every effort has been made to obtain as much relevant data as possible for this evaluation. It is important to note that the conclusions that lead to this procedure are derived in large part from the available data. Always take into account that the treatment will also be dependent on availability of resources and existing treatment guidelines, considered by other Pain Management Practitioners as being common knowledge and practice, at the time of the intervention. For Medico-Legal purposes, it is also important to point out that variation in procedural techniques and pharmacological choices are the acceptable norm. The indications, contraindications, technique, and results of the above procedure should only be interpreted and judged by a Board-Certified Interventional Pain Specialist with extensive familiarity and expertise in the same exact procedure and technique.

## 2019-11-05 NOTE — Patient Instructions (Addendum)
____________________________________________________________________________________________  Post-Procedure Discharge Instructions  Instructions:  Apply ice:   Purpose: This will minimize any swelling and discomfort after procedure.   When: Day of procedure, as soon as you get home.  How: Fill a plastic sandwich bag with crushed ice. Cover it with a small towel and apply to injection site.  How long: (15 min on, 15 min off) Apply for 15 minutes then remove x 15 minutes.  Repeat sequence on day of procedure, until you go to bed.  Apply heat:   Purpose: To treat any soreness and discomfort from the procedure.  When: Starting the next day after the procedure.  How: Apply heat to procedure site starting the day following the procedure.  How long: May continue to repeat daily, until discomfort goes away.  Food intake: Start with clear liquids (like water) and advance to regular food, as tolerated.   Physical activities: Keep activities to a minimum for the first 8 hours after the procedure. After that, then as tolerated.  Driving: If you have received any sedation, be responsible and do not drive. You are not allowed to drive for 24 hours after having sedation.  Blood thinner: (Applies only to those taking blood thinners) You may restart your blood thinner 6 hours after your procedure.  Insulin: (Applies only to Diabetic patients taking insulin) As soon as you can eat, you may resume your normal dosing schedule.  Infection prevention: Keep procedure site clean and dry. Shower daily and clean area with soap and water.  Post-procedure Pain Diary: Extremely important that this be done correctly and accurately. Recorded information will be used to determine the next step in treatment. For the purpose of accuracy, follow these rules:  Evaluate only the area treated. Do not report or include pain from an untreated area. For the purpose of this evaluation, ignore all other areas of pain,  except for the treated area.  After your procedure, avoid taking a long nap and attempting to complete the pain diary after you wake up. Instead, set your alarm clock to go off every hour, on the hour, for the initial 8 hours after the procedure. Document the duration of the numbing medicine, and the relief you are getting from it.  Do not go to sleep and attempt to complete it later. It will not be accurate. If you received sedation, it is likely that you were given a medication that may cause amnesia. Because of this, completing the diary at a later time may cause the information to be inaccurate. This information is needed to plan your care.  Follow-up appointment: Keep your post-procedure follow-up evaluation appointment after the procedure (usually 2 weeks for most procedures, 6 weeks for radiofrequencies). DO NOT FORGET to bring you pain diary with you.   Expect: (What should I expect to see with my procedure?)  From numbing medicine (AKA: Local Anesthetics): Numbness or decrease in pain. You may also experience some weakness, which if present, could last for the duration of the local anesthetic.  Onset: Full effect within 15 minutes of injected.  Duration: It will depend on the type of local anesthetic used. On the average, 1 to 8 hours.   From steroids (Applies only if steroids were used): Decrease in swelling or inflammation. Once inflammation is improved, relief of the pain will follow.  Onset of benefits: Depends on the amount of swelling present. The more swelling, the longer it will take for the benefits to be seen. In some cases, up to 10 days.  Duration: Steroids will stay in the system x 2 weeks. Duration of benefits will depend on multiple posibilities including persistent irritating factors.  Side-effects: If present, they may typically last 2 weeks (the duration of the steroids).  Frequent: Cramps (if they occur, drink Gatorade and take over-the-counter Magnesium 450-500 mg  once to twice a day); water retention with temporary weight gain; increases in blood sugar; decreased immune system response; increased appetite.  Occasional: Facial flushing (red, warm cheeks); mood swings; menstrual changes.  Uncommon: Long-term decrease or suppression of natural hormones; bone thinning. (These are more common with higher doses or more frequent use. This is why we prefer that our patients avoid having any injection therapies in other practices.)   Very Rare: Severe mood changes; psychosis; aseptic necrosis.  From procedure: Some discomfort is to be expected once the numbing medicine wears off. This should be minimal if ice and heat are applied as instructed.  Call if: (When should I call?)  You experience numbness and weakness that gets worse with time, as opposed to wearing off.  New onset bowel or bladder incontinence. (Applies only to procedures done in the spine)  Emergency Numbers:  Durning business hours (Monday - Thursday, 8:00 AM - 4:00 PM) (Friday, 9:00 AM - 12:00 Noon): [phone]  After hours: [phone]  NOTE: If you are having a problem and are unable connect with, or to talk to a provider, then go to your nearest urgent care or emergency department. If the problem is serious and urgent, please call 911. ____________________________________________________________________________________________   ____________________________________________________________________________________________  Medication Rules  Purpose: To inform patients, and their family members, of our rules and regulations.  Applies to: All patients receiving prescriptions (written or electronic).  Pharmacy of record: Pharmacy where electronic prescriptions will be sent. If written prescriptions are taken to a different pharmacy, please inform the nursing staff. The pharmacy listed in the electronic medical record should be the one where you would like electronic prescriptions  to be sent.  Electronic prescriptions: In compliance with the [place] (STOP) Act of 2017 (Session Lanny Cramp [phone]), effective June 11, 2018, all controlled substances must be electronically prescribed. Calling prescriptions to the pharmacy will cease to exist.  Prescription refills: Only during scheduled appointments. Applies to all prescriptions.  NOTE: The following applies primarily to controlled substances (Opioid* Pain Medications).   Patient's responsibilities: 1. Pain Pills: Bring all pain pills to every appointment (except for procedure appointments). 2. Pill Bottles: Bring pills in original pharmacy bottle. Always bring the newest bottle. Bring bottle, even if empty. 3. Medication refills: You are responsible for knowing and keeping track of what medications you take and those you need refilled. The day before your appointment: write a list of all prescriptions that need to be refilled. The day of the appointment: give the list to the admitting nurse. Prescriptions will be written only during appointments. No prescriptions will be written on procedure days. If you forget a medication: it will not be "Called in", "Faxed", or "electronically sent". You will need to get another appointment to get these prescribed. No early refills. Do not call asking to have your prescription filled early. 4. Prescription Accuracy: You are responsible for carefully inspecting your prescriptions before leaving our office. Have the discharge nurse carefully go over each prescription with you, before taking them home. Make sure that your name is accurately spelled, that your address is correct. Check the name and dose of your medication to make sure it  is accurate. Check the number of pills, and the written instructions to make sure they are clear and accurate. Make sure that you are given enough medication to last until your next medication refill  appointment. 5. Taking Medication: Take medication as prescribed. When it comes to controlled substances, taking less pills or less frequently than prescribed is permitted and encouraged. Never take more pills than instructed. Never take medication more frequently than prescribed.  6. Inform other Doctors: Always inform, all of your healthcare providers, of all the medications you take. 7. Pain Medication from other Providers: You are not allowed to accept any additional pain medication from any other Doctor or Healthcare provider. There are two exceptions to this rule. (see below) In the event that you require additional pain medication, you are responsible for notifying us, as stated below. 8. Medication Agreement: You are responsible for carefully reading and following our Medication Agreement. This must be signed before receiving any prescriptions from our practice. Safely store a copy of your signed Agreement. Violations to the Agreement will result in no further prescriptions. (Additional copies of our Medication Agreement are available upon request.) 9. Laws, Rules, & Regulations: All patients are expected to follow all Federal and [employer], [employer], Rules, [employer]. Ignorance of the Laws does not constitute a valid excuse.  10. Illegal drugs and Controlled Substances: The use of illegal substances (including, but not limited to marijuana and its derivatives) and/or the illegal use of any controlled substances is strictly prohibited. Violation of this rule may result in the immediate and permanent discontinuation of any and all prescriptions being written by our practice. The use of any illegal substances is prohibited. 11. Adopted CDC guidelines & recommendations: Target dosing levels will be at or below 60 MME/day. Use of benzodiazepines** is not recommended.  Exceptions: There are only two exceptions to the rule of not receiving pain medications from other Healthcare  Providers. 1. Exception #1 (Emergencies): In the event of an emergency (i.e.: accident requiring emergency care), you are allowed to receive additional pain medication. However, you are responsible for: As soon as you are able, call our office [phone], at any time of the day or night, and leave a message stating your name, the date and nature of the emergency, and the name and dose of the medication prescribed. In the event that your call is answered by a member of our staff, make sure to document and save the date, time, and the name of the person that took your information.  2. Exception #2 (Planned Surgery): In the event that you are scheduled by another doctor or dentist to have any type of surgery or procedure, you are allowed (for a period no longer than 30 days), to receive additional pain medication, for the acute post-op pain. However, in this case, you are responsible for picking up a copy of our "Post-op Pain Management for Surgeons" handout, and giving it to your surgeon or dentist. This document is available at our office, and does not require an appointment to obtain it. Simply go to our office during business hours (Monday-Thursday from 8:00 AM to 4:00 PM) (Friday 8:00 AM to 12:00 Noon) or if you have a scheduled appointment with [place], prior to your surgery, and ask for it by name. In addition, you will need to provide [place] with your name, name of your surgeon, type of surgery, and date of procedure or surgery.  *Opioid medications include: morphine, codeine, oxycodone, oxymorphone, hydrocodone, hydromorphone, meperidine, tramadol, tapentadol,  buprenorphine, fentanyl, methadone. **Benzodiazepine medications include: diazepam (Valium), alprazolam (Xanax), clonazepam (Klonopine), lorazepam (Ativan), clorazepate (Tranxene), chlordiazepoxide (Librium), estazolam (Prosom), oxazepam (Serax), temazepam (Restoril), triazolam (Halcion) (Last updated:  08/08/2017) ____________________________________________________________________________________________   ____________________________________________________________________________________________  Medication Recommendations and Reminders  Applies to: All patients receiving prescriptions (written and/or electronic).  Medication Rules & Regulations: These rules and regulations exist for your safety and that of others. They are not flexible and neither are we. Dismissing or ignoring them will be considered "non-compliance" with medication therapy, resulting in complete and irreversible termination of such therapy. (See document titled "Medication Rules" for more details.) In all conscience, because of safety reasons, we cannot continue providing a therapy where the patient does not follow instructions.  Pharmacy of record:   Definition: This is the pharmacy where your electronic prescriptions will be sent.   We do not endorse any particular pharmacy.  You are not restricted in your choice of pharmacy.  The pharmacy listed in the electronic medical record should be the one where you want electronic prescriptions to be sent.  If you choose to change pharmacy, simply notify our nursing staff of your choice of new pharmacy.  Recommendations:  Keep all of your pain medications in a safe place, under lock and key, even if you live alone.   After you fill your prescription, take 1 week's worth of pills and put them away in a safe place. You should keep a separate, properly labeled bottle for this purpose. The remainder should be kept in the original bottle. Use this as your primary supply, until it runs out. Once it's gone, then you know that you have 1 week's worth of medicine, and it is time to come in for a prescription refill. If you do this correctly, it is unlikely that you will ever run out of medicine.  To make sure that the above recommendation works, it is very important that you  make sure your medication refill appointments are scheduled at least 1 week before you run out of medicine. To do this in an effective manner, make sure that you do not leave the office without scheduling your next medication management appointment. Always ask the nursing staff to show you in your prescription , when your medication will be running out. Then arrange for the receptionist to get you a return appointment, at least 7 days before you run out of medicine. Do not wait until you have 1 or 2 pills left, to come in. This is very poor planning and does not take into consideration that we may need to cancel appointments due to bad weather, sickness, or emergencies affecting our staff.  "Partial Fill": If for any reason your pharmacy does not have enough pills/tablets to completely fill or refill your prescription, do not allow for a "partial fill". You will need a separate prescription to fill the remaining amount, which we will not provide. If the reason for the partial fill is your insurance, you will need to talk to the pharmacist about payment alternatives for the remaining tablets, but again, do not accept a partial fill.  Prescription refills and/or changes in medication(s):   Prescription refills, and/or changes in dose or medication, will be conducted only during scheduled medication management appointments. (Applies to both, written and electronic prescriptions.)  No refills on procedure days. No medication will be changed or started on procedure days. No changes, adjustments, and/or refills will be conducted on a procedure day. Doing so will interfere with the diagnostic portion of the procedure.  No phone refills. No medications will be "called into the pharmacy".  No Fax refills.  No weekend refills.  No Holliday refills.  No after hours refills.  Remember:  Business hours are:  Monday to Thursday 8:00 AM to 4:00 PM Provider's Schedule: Milinda Pointer, MD - Appointments  are:  Medication management: Monday and Wednesday 8:00 AM to 4:00 PM Procedure day: Tuesday and Thursday 7:30 AM to 4:00 PM Gillis Santa, MD - Appointments are:  Medication management: Tuesday and Thursday 8:00 AM to 4:00 PM Procedure day: Monday and Wednesday 7:30 AM to 4:00 PM (Last update: 08/08/2017) ____________________________________________________________________________________________   ____________________________________________________________________________________________  CANNABIDIOL (AKA: CBD Oil or Pills)  Applies to: All patients receiving prescriptions of controlled substances (written and/or electronic).  General Information: Cannabidiol (CBD) was discovered in 73. It is one of some 113 identified cannabinoids in cannabis (Marijuana) plants, accounting for up to 40% of the plant's extract. As of 2018, preliminary clinical research on cannabidiol included studies of anxiety, cognition, movement disorders, and pain.  Cannabidiol is consummed in multiple ways, including inhalation of cannabis smoke or vapor, as an aerosol spray into the cheek, and by mouth. It may be supplied as CBD oil containing CBD as the active ingredient (no added tetrahydrocannabinol (THC) or terpenes), a full-plant CBD-dominant hemp extract oil, capsules, dried cannabis, or as a liquid solution. CBD is thought not have the same psychoactivity as THC, and may affect the actions of THC. Studies suggest that CBD may interact with different biological targets, including cannabinoid receptors and other neurotransmitter receptors. As of 2018 the mechanism of action for its biological effects has not been determined.  In the [place], cannabidiol has a limited approval by the Food and Drug Administration (FDA) for treatment of only two types of epilepsy disorders. The side effects of long-term use of the drug include somnolence, decreased appetite, diarrhea, fatigue, malaise, weakness, sleeping  problems, and others.  CBD remains a Schedule I drug prohibited for any use.  Legality: Some manufacturers ship CBD products nationally, an illegal action which the FDA has not enforced in 2018, with CBD remaining the subject of an FDA investigational new drug evaluation, and is not considered legal as a dietary supplement or food ingredient as of December 2018. Federal illegality has made it difficult historically to conduct research on CBD. CBD is openly sold in head shops and health food stores in some states where such sales have not been explicitly legalized.  Warning: Because it is not FDA approved for general use or treatment of pain, it is not required to undergo the same manufacturing controls as prescription drugs.  This means that the available cannabidiol (CBD) may be contaminated with THC.  If this is the case, it will trigger a positive urine drug screen (UDS) test for cannabinoids (Marijuana).  Because a positive UDS for illicit substances is a violation of our medication agreement, your opioid analgesics (pain medicine) may be permanently discontinued. (Last update: 08/29/2017) ____________________________________________________________________________________________   ____________________________________________________________________________________________  Drug Holidays (Slow)  What is a "Drug Holiday"? Drug Holiday: is the name given to the period of time during which a patient stops taking a medication(s) for the purpose of eliminating tolerance to the drug.  Benefits . Improved effectiveness of opioids. . Decreased opioid dose needed to achieve benefits. . Improved pain with lesser dose.  What is tolerance? Tolerance: is the progressive decreased in effectiveness of a drug due to its repetitive use. With repetitive use, the body gets use to the medication and  as a consequence, it loses its effectiveness. This is a common problem seen with opioid pain medications. As  a result, a larger dose of the drug is needed to achieve the same effect that used to be obtained with a smaller dose.  How long should a "Drug Holiday" last? You should stay off of the pain medicine for at least 14 consecutive days. (2 weeks)  Should I stop the medicine "cold [place]"? No. You should always coordinate with your Pain Specialist so that he/she can provide you with the correct medication dose to make the transition as smoothly as possible.  How do I stop the medicine? Slowly. You will be instructed to decrease the daily amount of pills that you take by one (1) pill every seven (7) days. This is called a "slow downward taper" of your dose. For example: if you normally take four (4) pills per day, you will be asked to drop this dose to three (3) pills per day for seven (7) days, then to two (2) pills per day for seven (7) days, then to one (1) per day for seven (7) days, and at the end of those last seven (7) days, this is when the "Drug Holiday" would start.   Will I have withdrawals? By doing a "slow downward taper" like this one, it is unlikely that you will experience any significant withdrawal symptoms. Typically, what triggers withdrawals is the sudden stop of a high dose opioid therapy. Withdrawals can usually be avoided by slowly decreasing the dose over a prolonged period of time.  What are withdrawals? Withdrawals: refers to the wide range of symptoms that occur after stopping or dramatically reducing opiate drugs after heavy and prolonged use. Withdrawal symptoms do not occur to patients that use low dose opioids, or those who take the medication sporadically. Contrary to benzodiazepine (example: Valium, Xanax, etc.) or alcohol withdrawals ("Delirium Tremens"), opioid withdrawals are not lethal. Withdrawals are the physical manifestation of the body getting rid of the excess receptors.  Expected Symptoms Early symptoms of withdrawal may  include: . Agitation . Anxiety . Muscle aches . Increased tearing . Insomnia . Runny nose . Sweating . Yawning  Late symptoms of withdrawal may include: . Abdominal cramping . Diarrhea . Dilated pupils . Goose bumps . Nausea . Vomiting  Will I experience withdrawals? Due to the slow nature of the taper, it is very unlikely that you will experience any.  What is a slow taper? Taper: refers to the gradual decrease in dose.  ___________________________________________________________________________________________

## 2019-11-06 ENCOUNTER — Telehealth: Payer: Self-pay

## 2019-11-06 NOTE — Telephone Encounter (Signed)
Pt was called and stated that he was feeling great.

## 2019-11-19 ENCOUNTER — Telehealth: Payer: Medicare Other | Admitting: Pain Medicine

## 2019-11-24 ENCOUNTER — Encounter: Payer: Self-pay | Admitting: Pain Medicine

## 2019-11-24 NOTE — Progress Notes (Signed)
Patient: Joseph Graham  Service Category: E/M  Provider: [name], MD  DOB: 07/17/1940  DOS: 11/25/2019  Location: Office  MRN: [id number]  Setting: Ambulatory outpatient  Referring Provider: [name], MD  Type: Established Patient  Specialty: Interventional Pain Management  PCP: [name], MD  Location: Remote location  Delivery: TeleHealth     Virtual Encounter - Pain Management PROVIDER NOTE: Information contained herein reflects review and annotations entered in association with encounter. Interpretation of such information and data should be left to medically-trained personnel. Information provided to patient can be located elsewhere in the medical record under "Patient Instructions". Document created using STT-dictation technology, any transcriptional errors that may result from process are unintentional.    Contact & Pharmacy Preferred: [phone] Home: [phone] (home) Mobile: [phone] (mobile) E-mail: [email or web address]  [id number] - [address], [place] [address] [place] [address] Phone: [phone] Fax: [phone]   Pre-screening  Joseph Graham offered "in-person" vs "virtual" encounter. He indicated preferring virtual for this encounter.   Reason COVID-19*  Social distancing based on CDC and AMA recommendations.   I contacted Joseph Graham on 11/25/2019 via telephone.      I clearly identified myself as [name], MD. I verified that I was speaking with the correct person using two identifiers (Name: Joseph Graham, and date of birth: Nov 27, 1940).  Consent I sought verbal advanced consent from Joseph Graham for virtual visit interactions. I informed Joseph Graham of possible security and privacy concerns, risks, and limitations associated with providing "not-in-person" medical evaluation and management services. I also informed Mr.  Graham of the availability of "in-person" appointments. Finally, I informed him that there would be a charge for the virtual visit and that he could be  personally, fully or partially, financially responsible for it. Joseph Graham expressed understanding and agreed to proceed.   Historic Elements   Joseph Graham is a 79 y.o. year old, male patient evaluated today after his last contact with our practice on 11/06/2019. Joseph Graham  has a past medical history of Alcohol abuse, in remission, Arthritis, Benign essential tremor, BPH (benign prostatic hypertrophy) (12/30/2012), Cardiac arrhythmia (03/2010), Cataract, Chemotherapy-induced neutropenia ([place]) (09/23/2015), Chronic low back pain, Colon polyps, Complication of anesthesia (age [age]), COPD (chronic obstructive pulmonary disease) ([place]), Coronary artery disease, Dyslipidemia, GERD (gastroesophageal reflux disease), History of kidney stones, HTN (hypertension), Macular degeneration, bilateral, Multiple pulmonary nodules (02/2015), Neuromuscular disorder ([place]), Peripheral neuropathy due to chemotherapy ([place]) (12/23/2015), Personal history of tobacco use, presenting hazards to health (02/18/2015), Pneumothorax after biopsy (04/01/2015), Pre-diabetes, Primary cancer of bladder ([place]) (2016), Primary lung cancer ([place]) (2016), and Shortness of breath dyspnea. He also  has a past surgical history that includes Hemorroidectomy (1982); A flutter ablation (03/2010); Cataract extraction; carotid US (03/2010); Colonoscopy (09/2011); Tonsillectomy (1964); Electormagnetic navigation bronchoscopy (N/A, 03/02/2015); Transurethral resection of bladder tumor (N/A, 04/06/2015); Cystoscopy w/ retrogrades (Bilateral, 04/06/2015); urinary stent removal (04/14/15); Transurethral resection of bladder tumor (N/A, 07/13/2015); Portacath placement (N/A, 08/10/2015); Port-a-cath removal (Right, 04/03/2016); Polypectomy; Lung biopsy; and Hemi-microdiscectomy lumbar laminectomy level 1 (N/A,  01/26/2019). Joseph Graham has a current medication list which includes the following prescription(s): gabapentin, acetaminophen, albuterol, vitamin d-3, gabapentin, hydrocodone-acetaminophen, lisinopril, meloxicam, metoprolol tartrate, multivitamin with minerals, NON FORMULARY, omeprazole, oxycodone-acetaminophen, oxygen-helium, oxymetazoline, polyethylene glycol, primidone, turmeric, vitamin b-12, and vitamin e. He  reports that he quit smoking about 4 years ago. His smoking use included  cigarettes. He has a 91.50 pack-year smoking history. He has never used smokeless tobacco. He reports that he does not drink alcohol and does not use drugs. Joseph Graham has No Known Allergies.   HPI  Today, he is being contacted for a post-procedure assessment.  Today I spoke to this patient for over an hours trying to figure out where his pain is coming from.  The primary problem that I encountered is that he kept on using technical terms that were not accurate.  We had to go over the issue of his use of "bursitis" as well as "sciatica", which he actually meant something different than what the actual term means.  As it turns out, he indicated that after the procedure he continued to have pain in the area of the tailbone.  However he was not very specific as to the duration of the pain and the location of the pain.  It was clear to me that the patient was getting frustrated with our questioning and therefore I had to take time to explain to him the reason why I was asking such detail questions.  Once he understood that to [place] it was more important the location of the pain rather than the intensity, then we were able to communicate much better.  He indicated having pain in the area of the neck, the back, his hands, his feet, and he also thought that his recent back surgery by Dr. [name] had not helped because he was still having pain.  I had to clarify to him that he had several sources of pain that would be referring pain to the  area of the lower back.  Not only the L5 transverse processes that he had resected, but also the facet joints and also possible irritation of the L2 nerve root.  Unfortunately it took a while for him to share all of the areas where he was having pain since he kept attributing some of his pains to other things and therefore he was not mentioning them like for example he would not readily admit that he was having pain in his feet in association with his back because he was under the impression that his feet pain was secondary to a neuropathy developed by the chemotherapy that he received for his lung cancer.  Today I also spent quite some time going over the results of his CT scan and lumbar MRI both which he was under the impression that he had have them with contrast and it turns out that he did not.  The only studies that he had done with contrast were those of the chest CT where they were looking for recurrence of his cancer.  Although the CT and the MRI of the lumbar spine revealed multiple areas of problem, the reports continue to mention that none of those seem to be causing any significant compression or encroachment of nerves.  As it turns out, the patient is unable to lay down flat on his stomach or his back for any given.  Time.  He actually has to sleep in a recliner and the only way that he gets relief of his pain is by leaning forward and bending his spine.  That detail was extremely helpful in figuring out that were probably dealing with severe spinal stenosis.  However, every time that he has the studies done, he will always have them with several pillows under his knees so asked to flex his spine which in turn would then give [place] images  where the spine is open and not compressing anything in particular.  In view of this I have decided to order a CT myelogram office lumbar spine and I will be giving the patient a prescription for for a Percocet to be taken 30 minutes prior to having the study done.  I  have asked the patient to try to lay flat on his back and to tolerate his discomfort as much as possible so that the CT scan can actually show [place] where the compression is.  He understood and accepted and as soon as he has it done, he was told to give [place] a call so that we can go over them and hopefully find out where the problem is located.  As to the indication for this CT myelogram, it is clear that his condition is worsening.  When in the past he used to get very good relief of the pain with the caudal epidural steroid injection, now he is not.  Clearly, something must have changed.  Post-Procedure Evaluation  Procedure: Palliative midline caudal ESI #5 under fluoroscopic guidance and IV sedation Pre-procedure pain level: 3/10 Post-procedure: 0/10 (100% relief)  Sedation: Sedation provided.  Effectiveness during initial hour after procedure(Ultra-Short Term Relief): 100 %.  Local anesthetic used: Long-acting (4-6 hours) Effectiveness: Defined as any analgesic benefit obtained secondary to the administration of local anesthetics. This carries significant diagnostic value as to the etiological location, or anatomical origin, of the pain. Duration of benefit is expected to coincide with the duration of the local anesthetic used.  Effectiveness during initial 4-6 hours after procedure(Short-Term Relief): 0 % (Patient states "He is in the middle of his breaklfast right now and does not have a number").  Long-term benefit: Defined as any relief past the pharmacologic duration of the local anesthetics.  Effectiveness past the initial 6 hours after procedure(Long-Term Relief): 0 %.  Current benefits: Defined as benefit that persist at this time.   Analgesia:  No benefit Function: No benefit ROM: No benefit  Pharmacotherapy Assessment  Analgesic: No opioid analgesics prescribed by our practice. Hydrocodone/APAP 5/325 mg, 1 tab PO q 8 hrs (15 mg/day of hydrocodone) (Last prescribed on 02/18/2019, by  Cammie Sickle, MD & Verlon Au, NP. MME/day: 68m/day.   Monitoring: [place] PMP: PDMP reviewed during this encounter.       Pharmacotherapy: No side-effects or adverse reactions reported. Compliance: No problems identified. Effectiveness: Clinically acceptable. Plan: Refer to "POC".  UDS:  Summary  Date Value Ref Range Status  07/14/2018 FINAL  Final    Comment:    ==================================================================== TOXASSURE COMP DRUG ANALYSIS,UR ==================================================================== Test                             Result       Flag       Units Drug Present and Declared for Prescription Verification   Hydrocodone                    185          EXPECTED   ng/mg creat   Hydromorphone                  207          EXPECTED   ng/mg creat   Norhydrocodone                 563          EXPECTED  ng/mg creat    Sources of hydrocodone include scheduled prescription    medications. Hydromorphone and norhydrocodone are expected    metabolites of hydrocodone. Hydromorphone is also available as a    scheduled prescription medication.   Primidone                      PRESENT      EXPECTED   Phenobarbital                  PRESENT      EXPECTED    Phenobarbital is an expected metabolite of primidone;    Phenobarbital may also be administered as a prescription drug.   Gabapentin                     PRESENT      EXPECTED   Acetaminophen                  PRESENT      EXPECTED Drug Absent but Declared for Prescription Verification   Metoprolol                     Not Detected UNEXPECTED ==================================================================== Test                      Result    Flag   Units      Ref Range   Creatinine              96               mg/dL      >=20 ==================================================================== Declared Medications:  The flagging and interpretation on this report are based on the   following declared medications.  Unexpected results may arise from  inaccuracies in the declared medications.  **Note: The testing scope of this panel includes these medications:  Gabapentin  Hydrocodone (Hydrocodone-Acetaminophen)  Metoprolol (Lopressor)  Primidone (Mysoline)  **Note: The testing scope of this panel does not include small to  moderate amounts of these reported medications:  Acetaminophen  Acetaminophen (Hydrocodone-Acetaminophen)  **Note: The testing scope of this panel does not include following  reported medications:  Cannabidiol  Cholecalciferol  Cyanocobalamin  Lisinopril  Multivitamin  Omeprazole (Prilosec)  Polyethylene Glycol  Vitamin E ==================================================================== For clinical consultation, please call [phone]. ====================================================================     Laboratory Chemistry Profile   Renal Lab Results  Component Value Date   BUN 22 08/12/2019   CREATININE 0.61 08/12/2019   BCR 35 (H) 07/14/2018   GFR 116.42 07/10/2019   GFRAA >60 08/12/2019   GFRNONAA >60 08/12/2019     Hepatic Lab Results  Component Value Date   AST 19 08/12/2019   ALT 17 08/12/2019   ALBUMIN 4.0 08/12/2019   ALKPHOS 93 08/12/2019     Electrolytes Lab Results  Component Value Date   NA 137 08/12/2019   K 4.6 08/12/2019   CL 99 08/12/2019   CALCIUM 8.9 08/12/2019   MG 2.0 07/14/2018     Bone Lab Results  Component Value Date   25OHVITD1 30 07/14/2018   25OHVITD2 <1.0 07/14/2018   25OHVITD3 30 07/14/2018     Inflammation (CRP: Acute Phase) (ESR: Chronic Phase) Lab Results  Component Value Date   CRP 3 07/14/2018   ESRSEDRATE 51 (H) 07/14/2018       Note: Above Lab results reviewed.  Imaging  DG PAIN CLINIC C-ARM 1-60 MIN NO REPORT Fluoro was used, but  no Radiologist interpretation will be provided.  Please refer to "NOTES" tab for provider progress note.  Assessment  The  primary encounter diagnosis was Chronic pain syndrome. Diagnoses of Chronic low back pain (Primary area of Pain) (Bilateral)  (L>R) w/o sciatica, Chronic coccyx pain (Secondary Area of Pain) (Midline), Chronic lower extremity pain (Tertiary Area of Pain) (Bilateral) (R>L), Coccygodynia, Grade 1 Retrolisthesis of L2/L3, Lumbar facet hypertrophy, Lumbar lateral recess stenosis, Lumbar foraminal stenosis, DDD (degenerative disc disease), lumbar, Abnormal CT scan, lumbar spine (01/22/2019), Abnormal MRI, lumbar spine (06/12/2018), and Other intervertebral disc degeneration, lumbar region were also pertinent to this visit.  Plan of Care  Problem-specific:  No problem-specific Assessment & Plan notes found for this encounter.  Joseph Graham has a current medication list which includes the following long-term medication(s): gabapentin, albuterol, gabapentin, lisinopril, meloxicam, metoprolol tartrate, oxycodone-acetaminophen, primidone, and turmeric.  Pharmacotherapy (Medications Ordered): Meds ordered this encounter  Medications  . oxyCODONE-acetaminophen (PERCOCET) 5-325 MG tablet    Sig: Take 1 tablet by mouth once for 1 dose. Take 30 minutes before scan.    Dispense:  1 tablet    Refill:  0    Have driver.   Orders:  Orders Placed This Encounter  Procedures  . DG Myelogram Lumbar    Patient presents with axial pain with possible radicular component.  In addition to any acute findings, please report on:  1. Facet (Zygapophyseal) joint DJD (Hypertrophy, space narrowing, subchondral sclerosis, and/or osteophyte formation) 2. DDD and/or IVDD (Loss of disc height, desiccation or "Black disc disease") 3. Pars defects 4. Spondylolisthesis, spondylosis, and/or spondyloarthropathies (include Degree/Grade of displacement in mm) 5. Vertebral body Fractures, including age (old, new/acute) 35. Modic Type Changes 7. Demineralization 8. Bone pathology 9. Central, Lateral Recess, and/or Foraminal  Stenosis (include AP diameter of stenosis in mm) 10. Surgical changes (hardware type, status, and presence of fibrosis) NOTE: Please specify level(s) and laterality.    Standing Status:   Future    Standing Expiration Date:   01/25/2020    Scheduling Instructions:     This is to be scheduled with a follow-up Lumbar CT w/ contrast. Creatinine is not needed since it will not involve intravenous contrast. Please combine with DG of Lumbar Spine Complete with Flexion & Extension (6 views).    Order Specific Question:   Reason for Exam (SYMPTOM  OR DIAGNOSIS REQUIRED)    Answer:   Lumbosacral Radicular Symptoms    Order Specific Question:   Preferred imaging location?    Answer:   [facility]    Order Specific Question:   Call Results- Best Contact Number?    Answer:   [phone] [facility])  . CT LUMBAR SPINE W WO CONTRAST    Patient presents with axial pain with possible radicular component.  In addition to any acute findings, please report on:  1. Facet (Zygapophyseal) joint DJD (Hypertrophy, space narrowing, subchondral sclerosis, and/or osteophyte formation) 2. DDD and/or IVDD (Loss of disc height, desiccation or "Black disc disease") 3. Pars defects 4. Spondylolisthesis, spondylosis, and/or spondyloarthropathies (include Degree/Grade of displacement in mm) 5. Vertebral body Fractures, including age (old, new/acute) 29. Modic Type Changes 7. Demineralization 8. Bone pathology 9. Central, Lateral Recess, and/or Foraminal Stenosis (include AP diameter of stenosis in mm) 10. Surgical changes (hardware type, status, and presence of fibrosis) NOTE: Please specify level(s) and laterality.    Standing Status:   Future    Standing Expiration Date:   01/25/2020    Order Specific Question:  If indicated for the ordered procedure, I authorize the administration of contrast media per Radiology protocol    Answer:   Yes    Order Specific Question:   Preferred imaging location?     Answer:   [facility]    Order Specific Question:   Call Results- Best Contact Number?    Answer:   [phone] ([facility])    Order Specific Question:   Radiology Contrast Protocol - do NOT remove file path    Answer:   [email or web address]   Follow-up plan:   No follow-ups on file.      Interventional management options: Considering: Possible Racz procedure Diagnostic coccygeal injection DiagnosticrightL4-5 LESI Diagnostic right L3-4 LESI Diagnostic right L2 TFESI Diagnostic right L3 TFESI Diagnostic right L4 TFESI DiagnosticbilateralLSB Possible bilateral lumbar facetRFA Diagnostic bilateral S-Iblock Possible bilateral S-I jointRFA   Palliative PRN treatment(s): Palliative midline caudal ESIs #3 (he typically gets 100% relief of the pain for about 3 months) Palliative bilateral lumbar facet blocks #2 (100/100/90/90) (helped w/ LE spasms/cramps & LBP)  Diagnosticleft L5 transverse process and sacrumpseudoarticulationinjection #2     Recent Visits Date Type Provider Dept  11/05/19 Procedure visit Milinda Pointer, MD [facility]  09/17/19 Telemedicine Milinda Pointer, MD [facility]  Showing recent visits within past 90 days and meeting all other requirements Today's Visits Date Type Provider Dept  11/25/19 Telemedicine Milinda Pointer, MD [facility]  Showing today's visits and meeting all other requirements Future Appointments No visits were found meeting these conditions. Showing future appointments within next 90 days and meeting all other requirements  I discussed the assessment and treatment plan with the patient. The patient was provided an opportunity to ask questions and all were answered. The patient agreed with the plan and demonstrated an understanding of the instructions.  Patient advised to call back or seek an in-person evaluation if the symptoms or  condition worsens.  Duration of encounter: 75 minutes.  Note by: [name], MD Date: 11/25/2019; Time: 4:00 PM

## 2019-11-25 ENCOUNTER — Ambulatory Visit: Payer: Medicare Other | Attending: Pain Medicine | Admitting: Pain Medicine

## 2019-11-25 ENCOUNTER — Other Ambulatory Visit: Payer: Self-pay

## 2019-11-25 DIAGNOSIS — M5136 Other intervertebral disc degeneration, lumbar region: Secondary | ICD-10-CM

## 2019-11-25 DIAGNOSIS — M533 Sacrococcygeal disorders, not elsewhere classified: Secondary | ICD-10-CM | POA: Diagnosis not present

## 2019-11-25 DIAGNOSIS — M545 Low back pain: Secondary | ICD-10-CM

## 2019-11-25 DIAGNOSIS — M48061 Spinal stenosis, lumbar region without neurogenic claudication: Secondary | ICD-10-CM

## 2019-11-25 DIAGNOSIS — M79605 Pain in left leg: Secondary | ICD-10-CM

## 2019-11-25 DIAGNOSIS — G8929 Other chronic pain: Secondary | ICD-10-CM

## 2019-11-25 DIAGNOSIS — M51369 Other intervertebral disc degeneration, lumbar region without mention of lumbar back pain or lower extremity pain: Secondary | ICD-10-CM

## 2019-11-25 DIAGNOSIS — G894 Chronic pain syndrome: Secondary | ICD-10-CM

## 2019-11-25 DIAGNOSIS — M4310 Spondylolisthesis, site unspecified: Secondary | ICD-10-CM | POA: Diagnosis not present

## 2019-11-25 DIAGNOSIS — R937 Abnormal findings on diagnostic imaging of other parts of musculoskeletal system: Secondary | ICD-10-CM

## 2019-11-25 DIAGNOSIS — M79604 Pain in right leg: Secondary | ICD-10-CM

## 2019-11-25 DIAGNOSIS — M47816 Spondylosis without myelopathy or radiculopathy, lumbar region: Secondary | ICD-10-CM

## 2019-11-25 MED ORDER — OXYCODONE-ACETAMINOPHEN 5-325 MG PO TABS: 1.0000 | ORAL_TABLET | Freq: Once | ORAL | 0 refills | Status: DC

## 2019-12-08 ENCOUNTER — Ambulatory Visit
Admission: RE | Admit: 2019-12-08 | Discharge: 2019-12-08 | Disposition: A | Discharge status: Home / Self Care | Payer: Medicare Other | Source: Ambulatory Visit | Attending: Pain Medicine | Admitting: Pain Medicine

## 2019-12-08 ENCOUNTER — Ambulatory Visit: Payer: Medicare Other

## 2019-12-10 ENCOUNTER — Other Ambulatory Visit: Payer: Self-pay | Admitting: *Deleted

## 2019-12-10 DIAGNOSIS — C3411 Malignant neoplasm of upper lobe, right bronchus or lung: Secondary | ICD-10-CM

## 2019-12-10 DIAGNOSIS — T451X5A Adverse effect of antineoplastic and immunosuppressive drugs, initial encounter: Secondary | ICD-10-CM

## 2019-12-10 DIAGNOSIS — G620 Drug-induced polyneuropathy: Secondary | ICD-10-CM

## 2019-12-10 DIAGNOSIS — C3491 Malignant neoplasm of unspecified part of right bronchus or lung: Secondary | ICD-10-CM

## 2019-12-11 MED ORDER — HYDROCODONE-ACETAMINOPHEN 5-325 MG PO TABS: 1.0000 | ORAL_TABLET | Freq: Three times a day (TID) | ORAL | 0 refills | Status: DC | PRN

## 2019-12-11 NOTE — Telephone Encounter (Signed)
Re: narcotic refill  Reviewed PDMP and appropriate for refill.  Rx Norco 5-325 mg tablets every 8 hours as needed for pain # 90 tabs.   Faythe Casa, NP 12/11/2019 10:22 AM

## 2019-12-18 DIAGNOSIS — H353222 Exudative age-related macular degeneration, left eye, with inactive choroidal neovascularization: Secondary | ICD-10-CM | POA: Diagnosis not present

## 2019-12-18 DIAGNOSIS — H02109 Unspecified ectropion of unspecified eye, unspecified eyelid: Secondary | ICD-10-CM | POA: Diagnosis not present

## 2019-12-18 DIAGNOSIS — H353211 Exudative age-related macular degeneration, right eye, with active choroidal neovascularization: Secondary | ICD-10-CM | POA: Diagnosis not present

## 2019-12-25 DIAGNOSIS — H02109 Unspecified ectropion of unspecified eye, unspecified eyelid: Secondary | ICD-10-CM | POA: Diagnosis not present

## 2020-01-14 ENCOUNTER — Encounter: Payer: Self-pay | Admitting: Family Medicine

## 2020-01-14 ENCOUNTER — Other Ambulatory Visit: Payer: Self-pay

## 2020-01-14 ENCOUNTER — Ambulatory Visit (INDEPENDENT_AMBULATORY_CARE_PROVIDER_SITE_OTHER): Payer: Medicare Other | Admitting: Family Medicine

## 2020-01-14 DIAGNOSIS — J449 Chronic obstructive pulmonary disease, unspecified: Secondary | ICD-10-CM

## 2020-01-14 DIAGNOSIS — E782 Mixed hyperlipidemia: Secondary | ICD-10-CM

## 2020-01-14 DIAGNOSIS — C679 Malignant neoplasm of bladder, unspecified: Secondary | ICD-10-CM | POA: Diagnosis not present

## 2020-01-14 DIAGNOSIS — J310 Chronic rhinitis: Secondary | ICD-10-CM

## 2020-01-14 DIAGNOSIS — I10 Essential (primary) hypertension: Secondary | ICD-10-CM | POA: Diagnosis not present

## 2020-01-14 DIAGNOSIS — Z6831 Body mass index (BMI) 31.0-31.9, adult: Secondary | ICD-10-CM

## 2020-01-14 DIAGNOSIS — M5136 Other intervertebral disc degeneration, lumbar region: Secondary | ICD-10-CM

## 2020-01-14 DIAGNOSIS — C3411 Malignant neoplasm of upper lobe, right bronchus or lung: Secondary | ICD-10-CM | POA: Diagnosis not present

## 2020-01-14 DIAGNOSIS — R0902 Hypoxemia: Secondary | ICD-10-CM

## 2020-01-14 DIAGNOSIS — T485X5A Adverse effect of other anti-common-cold drugs, initial encounter: Secondary | ICD-10-CM

## 2020-01-14 DIAGNOSIS — E6609 Other obesity due to excess calories: Secondary | ICD-10-CM

## 2020-01-14 MED ORDER — ROSUVASTATIN CALCIUM 10 MG PO TABS: 10.0000 mg | ORAL_TABLET | Freq: Every day | ORAL | 1 refills | Status: DC

## 2020-01-14 MED ORDER — PREDNISONE 20 MG PO TABS: 40.0000 mg | ORAL_TABLET | Freq: Every day | ORAL | 0 refills | Status: DC

## 2020-01-14 MED ORDER — ALBUTEROL SULFATE HFA 108 (90 BASE) MCG/ACT IN AERS: 2.0000 | INHALATION_SPRAY | Freq: Four times a day (QID) | RESPIRATORY_TRACT | 3 refills | Status: DC | PRN

## 2020-01-14 MED ORDER — FLUTICASONE PROPIONATE 50 MCG/ACT NA SUSP: 2.0000 | Freq: Every day | NASAL | 6 refills | Status: DC

## 2020-01-14 NOTE — Assessment & Plan Note (Addendum)
Chronic on supplemental oxygen since 06/2017.  Not currently on breathing medication (previousy on spiriva) - states he does not need this. Will continue supplemental oxygen and albuterol PRN.

## 2020-01-14 NOTE — Patient Instructions (Addendum)
You are doing well today Try crestor 10mg  daily if tolerated, if not drop frequency of dosing. Let us know how you do Try prednisone burst for 5 days along with flonase in effort to taper off oxymetolazone nasal spray  Return in 6 months for physical

## 2020-01-14 NOTE — Progress Notes (Signed)
This visit was conducted in person.  BP (!) 144/76 (BP Location: Right Arm, Patient Position: Sitting, Cuff Size: Normal)   Pulse 61   Temp 97.6 F (36.4 C) (Temporal)   Ht 5\' 9"  (1.753 m)   SpO2 94% Comment: 2 L  BMI 26.43 kg/m    CC: 6 mo f/u visit  Subjective:    Patient ID: Joseph Graham, male    DOB: 03/31/1941, 79 y.o.   MRN: [id number]  HPI: Joseph Graham is a 79 y.o. male presenting on 01/14/2020 for Follow-up (Here for 6 mo f/u.)   Known RUL lung cancer s/p radiation and adjuvant chemotherapy 2016. Known bladder cancer s/p resection 2018.   Sees pain clinic for chronic back pain. Stopped tylenol - did not notice any difference in pain control when on it. Stopped meloxicam and turmeric.  Has needed to be more active with wife's recent illness - this actually has helped him with chronic back pain and weight loss.   In known h/o CAD - discussed aspirin - he has restarted. Discussed statin - agrees to try crestor.   Oxygen dependent COPD - denies exertional or rest dyspnea, wheezing. Does have productive cough. Predominantly uses supplemental oxygen for exertion, for sleep.   Did receive Moderna vaccine 07/2019      Relevant past medical, surgical, family and social history reviewed and updated as indicated. Interim medical history since our last visit reviewed. Allergies and medications reviewed and updated. Outpatient Medications Prior to Visit  Medication Sig Dispense Refill  . ASPIRIN 81 PO Take 1 tablet by mouth daily.    . Cholecalciferol (VITAMIN D-3) 125 MCG (5000 UT) TABS Take 5,000 Units by mouth daily.    Marland Kitchen gabapentin (NEURONTIN) 300 MG capsule TAKE 1 CAPSULE BY MOUTH IN THE MORNING, 1 CAPSULE IN THE AFTERNOON, AND 2 CAPSULES AT NIGHT    . HYDROcodone-acetaminophen (NORCO) 5-325 MG tablet Take 1 tablet by mouth every 8 (eight) hours as needed for moderate pain. (Patient taking differently: Take 1 tablet by mouth every 6 (six) hours as needed for moderate  pain. ) 90 tablet 0  . lisinopril (ZESTRIL) 10 MG tablet TAKE 1 TABLET BY MOUTH EVERY EVENING 90 tablet 3  . metoprolol tartrate (LOPRESSOR) 25 MG tablet TAKE 1/2 TABLET BY MOUTH TWICE DAILY 90 tablet 3  . Multiple Vitamin (MULTIVITAMIN WITH MINERALS) TABS tablet Take 1 tablet by mouth every evening.    . NON FORMULARY Take 500 mg by mouth daily. Hemp Oil 500 mg    . omeprazole (PRILOSEC) 20 MG capsule Take 20 mg by mouth every morning.     . OXYGEN Inhale 2 L into the lungs daily.    Marland Kitchen oxymetazoline (AFRIN) 0.05 % nasal spray Place 1 spray into both nostrils 2 (two) times daily.    . polyethylene glycol (MIRALAX / GLYCOLAX) packet Take 17 g by mouth daily.    . primidone (MYSOLINE) 50 MG tablet Take 150 mg by mouth 2 (two) times a day.    . vitamin B-12 (CYANOCOBALAMIN) 100 MCG tablet Take 100 mcg by mouth every evening.    . vitamin E 400 UNIT capsule Take 400 Units by mouth every evening.     Marland Kitchen albuterol (PROAIR HFA) 108 (90 Base) MCG/ACT inhaler Inhale 2 puffs into the lungs every 6 (six) hours as needed for wheezing or shortness of breath. 1 Inhaler 3  . gabapentin (NEURONTIN) 300 MG capsule Take 300-600 mg by mouth See admin instructions. Take 1 capsule (  300 mg) by mouth in the morning, 1 capsule (300 mg) by mouth at supper, & 2 capsules (600 mg) by mouth at bedtime.    Marland Kitchen acetaminophen (TYLENOL) 500 MG tablet Take 1,000 mg by mouth every 6 (six) hours as needed (pain/headaches.).    Marland Kitchen meloxicam (MOBIC) 15 MG tablet Take 1 tablet (15 mg total) by mouth daily. 30 tablet 5  . oxyCODONE-acetaminophen (PERCOCET) 5-325 MG tablet Take 1 tablet by mouth once for 1 dose. Take 30 minutes before scan. 1 tablet 0  . Turmeric 500 MG CAPS Take 500 mg by mouth daily. 30 capsule 5   No facility-administered medications prior to visit.     Per HPI unless specifically indicated in ROS section below Review of Systems Objective:  BP (!) 144/76 (BP Location: Right Arm, Patient Position: Sitting, Cuff Size:  Normal)   Pulse 61   Temp 97.6 F (36.4 C) (Temporal)   Ht 5\' 9"  (1.753 m)   SpO2 94% Comment: 2 L  BMI 26.43 kg/m   Wt Readings from Last 3 Encounters:  11/05/19 179 lb (81.2 kg)  08/04/19 186 lb (84.4 kg)  07/14/19 186 lb (84.4 kg)      Physical Exam Vitals and nursing note reviewed.  Constitutional:      Appearance: Normal appearance. He is not ill-appearing.     Comments:  O2 supplemental in place through [facility] with rolling walker  Cardiovascular:     Rate and Rhythm: Normal rate and regular rhythm.     Pulses: Normal pulses.     Heart sounds: Normal heart sounds. No murmur heard.   Pulmonary:     Effort: Pulmonary effort is normal. No respiratory distress.     Breath sounds: No wheezing, rhonchi or rales.     Comments: Coarse bibasilarly Musculoskeletal:     Right lower leg: No edema.     Left lower leg: No edema.  Neurological:     Mental Status: He is alert.       Lab Results  Component Value Date   HGBA1C 6.2 07/10/2019    Lab Results  Component Value Date   CHOL 210 (H) 07/10/2019   HDL 48.40 07/10/2019   LDLCALC 130 (H) 07/10/2019   LDLDIRECT 105 08/08/2010   TRIG 160.0 (H) 07/10/2019   CHOLHDL 4 07/10/2019    Assessment & Plan:  This visit occurred during the SARS-CoV-2 public health emergency.  Safety protocols were in place, including screening questions prior to the visit, additional usage of staff PPE, and extensive cleaning of exam room while observing appropriate contact time as indicated for disinfecting solutions.   Problem List Items Addressed This Visit    Rhinitis medicamentosa    Continues oxymetazoline use. Has failed trials of tapering off this med despite regular flonase use - will Rx 5d prednisone burst to ee if we can facilitate taper.       Primary cancer of bladder ([place])    Due for f/u - previously postponed due to pandemic.       Relevant Medications   ASPIRIN 81 PO   predniSONE (DELTASONE) 20 MG tablet   Malignant  neoplasm of right upper lobe of lung ([place])    Appreciate onc care - sees regularly      Relevant Medications   ASPIRIN 81 PO   predniSONE (DELTASONE) 20 MG tablet   HTN (hypertension)    Chronic, adequate control. Continue lisinopril and metoprolol      Relevant Medications   ASPIRIN 81  PO   rosuvastatin (CRESTOR) 10 MG tablet   HLD (hyperlipidemia)    Chronic, in h/o CAD and PAD did recommend retrial statin - will trial crestor 10mg  daily. He has also restarted aspirin.       Relevant Medications   ASPIRIN 81 PO   rosuvastatin (CRESTOR) 10 MG tablet   DDD (degenerative disc disease), lumbar (Chronic)    Increased activity recently has helped chronic back pain.       Relevant Medications   ASPIRIN 81 PO   predniSONE (DELTASONE) 20 MG tablet   COPD with hypoxia ([facility])    Chronic on supplemental oxygen since 06/2017.  Not currently on breathing medication (previousy on spiriva) - states he does not need this. Will continue supplemental oxygen and albuterol PRN.       Relevant Medications   albuterol (PROAIR HFA) 108 (90 Base) MCG/ACT inhaler   fluticasone (FLONASE) 50 MCG/ACT nasal spray   predniSONE (DELTASONE) 20 MG tablet   Class 1 obesity due to excess calories with serious comorbidity and body mass index (BMI) of 31.0 to 31.9 in adult    Weight loss noted - he has been more active with wife's recent illness.           Meds ordered this encounter  Medications  . albuterol (PROAIR HFA) 108 (90 Base) MCG/ACT inhaler    Sig: Inhale 2 puffs into the lungs every 6 (six) hours as needed for wheezing or shortness of breath.    Dispense:  18 g    Refill:  3  . rosuvastatin (CRESTOR) 10 MG tablet    Sig: Take 1 tablet (10 mg total) by mouth daily.    Dispense:  90 tablet    Refill:  1  . fluticasone (FLONASE) 50 MCG/ACT nasal spray    Sig: Place 2 sprays into both nostrils daily.    Dispense:  16 g    Refill:  6  . predniSONE (DELTASONE) 20 MG tablet    Sig: Take 2  tablets (40 mg total) by mouth daily with breakfast.    Dispense:  10 tablet    Refill:  0   No orders of the defined types were placed in this encounter.   Patient Instructions  You are doing well today Try crestor 10mg  daily if tolerated, if not drop frequency of dosing. Let us know how you do Try prednisone burst for 5 days along with flonase in effort to taper off oxymetolazone nasal spray  Return in 6 months for physical    Follow up plan: Return in about 6 months (around 07/16/2020) for annual exam, prior fasting for blood work, medicare wellness visit.  [name], MD

## 2020-01-17 NOTE — Assessment & Plan Note (Signed)
Due for f/u - previously postponed due to pandemic.

## 2020-01-17 NOTE — Assessment & Plan Note (Signed)
Chronic, adequate control. Continue lisinopril and metoprolol

## 2020-01-17 NOTE — Assessment & Plan Note (Signed)
Weight loss noted - he has been more active with wife's recent illness.

## 2020-01-17 NOTE — Assessment & Plan Note (Signed)
Appreciate onc care - sees regularly

## 2020-01-17 NOTE — Assessment & Plan Note (Signed)
Chronic, in h/o CAD and PAD did recommend retrial statin - will trial crestor 10mg  daily. He has also restarted aspirin.

## 2020-01-17 NOTE — Assessment & Plan Note (Addendum)
Increased activity recently has helped chronic back pain.

## 2020-01-17 NOTE — Assessment & Plan Note (Signed)
Continues oxymetazoline use. Has failed trials of tapering off this med despite regular flonase use - will Rx 5d prednisone burst to ee if we can facilitate taper.

## 2020-01-20 ENCOUNTER — Encounter: Payer: Self-pay | Admitting: Family Medicine

## 2020-01-20 ENCOUNTER — Other Ambulatory Visit: Payer: Self-pay | Admitting: *Deleted

## 2020-01-20 DIAGNOSIS — C3411 Malignant neoplasm of upper lobe, right bronchus or lung: Secondary | ICD-10-CM

## 2020-01-20 DIAGNOSIS — T451X5A Adverse effect of antineoplastic and immunosuppressive drugs, initial encounter: Secondary | ICD-10-CM

## 2020-01-20 DIAGNOSIS — C3491 Malignant neoplasm of unspecified part of right bronchus or lung: Secondary | ICD-10-CM

## 2020-01-21 ENCOUNTER — Encounter: Payer: Self-pay | Admitting: Internal Medicine

## 2020-01-22 MED ORDER — HYDROCODONE-ACETAMINOPHEN 5-325 MG PO TABS: 1.0000 | ORAL_TABLET | Freq: Three times a day (TID) | ORAL | 0 refills | Status: DC | PRN

## 2020-01-22 NOTE — Telephone Encounter (Signed)
Patient sent mychart msg to request for his RF. This request was submitted to the NPs on 8/11 and not yet approved. Please review.

## 2020-02-19 DIAGNOSIS — M6281 Muscle weakness (generalized): Secondary | ICD-10-CM | POA: Diagnosis not present

## 2020-02-19 DIAGNOSIS — M545 Low back pain: Secondary | ICD-10-CM | POA: Diagnosis not present

## 2020-02-19 DIAGNOSIS — G8929 Other chronic pain: Secondary | ICD-10-CM | POA: Diagnosis not present

## 2020-02-19 DIAGNOSIS — G250 Essential tremor: Secondary | ICD-10-CM | POA: Diagnosis not present

## 2020-02-24 ENCOUNTER — Other Ambulatory Visit: Payer: Self-pay

## 2020-02-24 ENCOUNTER — Inpatient Hospital Stay: Payer: Medicare Other | Attending: Internal Medicine

## 2020-02-24 ENCOUNTER — Ambulatory Visit
Admission: RE | Admit: 2020-02-24 | Discharge: 2020-02-24 | Disposition: A | Discharge status: Home / Self Care | Payer: Medicare Other | Source: Ambulatory Visit | Attending: Internal Medicine | Admitting: Internal Medicine

## 2020-02-24 DIAGNOSIS — C3491 Malignant neoplasm of unspecified part of right bronchus or lung: Secondary | ICD-10-CM

## 2020-02-24 DIAGNOSIS — C3411 Malignant neoplasm of upper lobe, right bronchus or lung: Secondary | ICD-10-CM | POA: Insufficient documentation

## 2020-02-24 DIAGNOSIS — Z8551 Personal history of malignant neoplasm of bladder: Secondary | ICD-10-CM | POA: Diagnosis not present

## 2020-02-24 DIAGNOSIS — Q278 Other specified congenital malformations of peripheral vascular system: Secondary | ICD-10-CM | POA: Diagnosis not present

## 2020-02-24 DIAGNOSIS — I700 Atherosclerosis of aorta: Secondary | ICD-10-CM | POA: Diagnosis not present

## 2020-02-24 DIAGNOSIS — I2510 Atherosclerotic heart disease of native coronary artery without angina pectoris: Secondary | ICD-10-CM | POA: Diagnosis not present

## 2020-02-24 LAB — CBC WITH DIFFERENTIAL/PLATELET
Abs Immature Granulocytes: 0.01 10*3/uL (ref 0.00–0.07)
Basophils Absolute: 0.1 10*3/uL (ref 0.0–0.1)
Basophils Relative: 1 %
Eosinophils Absolute: 0.2 10*3/uL (ref 0.0–0.5)
Eosinophils Relative: 4 %
HCT: 38 % — ABNORMAL LOW (ref 39.0–52.0)
Hemoglobin: 12.6 g/dL — ABNORMAL LOW (ref 13.0–17.0)
Immature Granulocytes: 0 %
Lymphocytes Relative: 23 %
Lymphs Abs: 1.4 10*3/uL (ref 0.7–4.0)
MCH: 32.8 pg (ref 26.0–34.0)
MCHC: 33.2 g/dL (ref 30.0–36.0)
MCV: 99 fL (ref 80.0–100.0)
Monocytes Absolute: 0.7 10*3/uL (ref 0.1–1.0)
Monocytes Relative: 11 %
Neutro Abs: 4 10*3/uL (ref 1.7–7.7)
Neutrophils Relative %: 61 %
Platelets: 220 10*3/uL (ref 150–400)
RBC: 3.84 MIL/uL — ABNORMAL LOW (ref 4.22–5.81)
RDW: 12.2 % (ref 11.5–15.5)
WBC: 6.4 10*3/uL (ref 4.0–10.5)
nRBC: 0 % (ref 0.0–0.2)

## 2020-02-24 LAB — COMPREHENSIVE METABOLIC PANEL
ALT: 21 U/L (ref 0–44)
AST: 24 U/L (ref 15–41)
Albumin: 3.8 g/dL (ref 3.5–5.0)
Alkaline Phosphatase: 115 U/L (ref 38–126)
Anion gap: 8 (ref 5–15)
BUN: 20 mg/dL (ref 8–23)
CO2: 32 mmol/L (ref 22–32)
Calcium: 8.7 mg/dL — ABNORMAL LOW (ref 8.9–10.3)
Chloride: 98 mmol/L (ref 98–111)
Creatinine, Ser: 0.69 mg/dL (ref 0.61–1.24)
GFR calc Af Amer: >60 mL/min (ref >60)
GFR calc non Af Amer: >60 mL/min (ref >60)
Glucose, Bld: 116 mg/dL — ABNORMAL HIGH (ref 70–99)
Potassium: 5.5 mmol/L — ABNORMAL HIGH (ref 3.5–5.1)
Sodium: 138 mmol/L (ref 135–145)
Total Bilirubin: 0.4 mg/dL (ref 0.3–1.2)
Total Protein: 7.8 g/dL (ref 6.5–8.1)

## 2020-02-24 MED ORDER — IOHEXOL 300 MG/ML  SOLN
75.0000 mL | Freq: Once | INTRAMUSCULAR | Status: AC | PRN
  Administered 2020-02-24: 75 mL via INTRAVENOUS

## 2020-02-26 ENCOUNTER — Encounter: Payer: Self-pay | Admitting: Internal Medicine

## 2020-02-26 ENCOUNTER — Inpatient Hospital Stay (HOSPITAL_BASED_OUTPATIENT_CLINIC_OR_DEPARTMENT_OTHER): Payer: Medicare Other | Admitting: Internal Medicine

## 2020-02-26 DIAGNOSIS — C3411 Malignant neoplasm of upper lobe, right bronchus or lung: Secondary | ICD-10-CM

## 2020-02-26 NOTE — Assessment & Plan Note (Addendum)
#   Right UL POORLY DIFF CA- T3 N0  Currently status post radiation [finished feb 2017];  Finished adjuvant chemotherapy [end of April 2017].  CT scan SEP 15th:  # RLL-1.5cm-peripheral continues to grow over the last few years.  Other lung lesions are stable/slightly increased.  Patient is not interested in systemic therapy.  Patient is concerned with radiation given his ongoing back pain/concerns for inability to lay on the table for radiation.  Will review tumor conference.   # Peripheral neuropathy/back pain [pain clinic Dr.Neivro]-worse; however limited options as per patient.  Continues to be in hydrocodone every 8 hours.  Patient concerned about narcotic addiction.  Would recommend evaluation with palliative care [name] to help address pain issues/so that patient can proceed with radiation.   # COPD on O2/nasal cannula.  Stable.  # Bladder cancer- superficial-stable  # DISPOSITION:  # refer to [name] next early week-Virtual re: pain control # Follow TBD- Dr.B

## 2020-02-26 NOTE — Progress Notes (Signed)
I connected with Joseph Graham on 02/26/2020 at  1:30 PM EDT by video enabled telemedicine visit and verified that I am speaking with the correct person using two identifiers.  I discussed the limitations, risks, security and privacy concerns of performing an evaluation and management service by telemedicine and the availability of in-person appointments. I also discussed with the patient that there may be a patient responsible charge related to this service. The patient expressed understanding and agreed to proceed.    Other persons participating in the visit and their role in the encounter: RN/medical reconciliation Patient's location: home Provider's location: office  Oncology History Overview Note   # OCT 2016- RUL POORLY DIFF CA [clinically non-small cell] ; STAGE IIB [T3-2 separate nodules in RUL; N0] [s/p CT guided bx]- s/p RT [finished mid jan 2017]; FEB 2017-CT scan- regression of RUL nodules;   # March 2017- Start carbo-taxol q 3 w x4; June 2017- CT- Improvement of RUL nodule/ Stable RUL nodules. OCT 2017- PR   # OCT 2016- 2 sub cm nodules in Right LL- Feb CT 2017- STABLE; June 2017- resolved  # May- 2017- G2 PN  # OCT 2016-NON-invasive bladder urothelial cancer; high grade [Dr. [name]; s/p TURBT [Feb 2017- NEG]; declined BCG  #   # Pneumothorax [oct 2016 s/p CT Bx];  # MOLECULAR STUDIES- PDL-1 by IHC/keytruda- 11-20%.  -----------------------------------------    DIAGNOSIS: RUL Lung cancer  STAGE:   II ; GOALS: control  CURRENT/MOST RECENT THERAPY: Surveillance      Malignant neoplasm of right upper lobe of lung ([place])  12/20/2015 Initial Diagnosis   Malignant neoplasm of right upper lobe of lung ([place])    Chief Complaint: lung cancer    History of present illness:Joseph Graham 79 y.o.  male with history of above history of non-small cell lung cancer is currently here for review of his surveillance CT scan.  Patient denies any new shortness of breath or  cough.  However continues to complain of worsening back pain. worsening tingling numbness in extremities/neuropathy.  He continues to be on hydrocodone every 8 hours.  Continues to follow-up with pain clinic.  No nausea no vomiting no headaches.  Observation/objective: CT scan see below; labs CBC chemistries-reviewed unremarkable.  Assessment and plan: Malignant neoplasm of right upper lobe of lung ([place]) # Right UL POORLY DIFF CA- T3 N0  Currently status post radiation [finished feb 2017];  Finished adjuvant chemotherapy [end of April 2017].  CT scan SEP 15th:  # RLL-1.5cm-peripheral continues to grow over the last few years.  Other lung lesions are stable/slightly increased.  Patient is not interested in systemic therapy.  Patient is concerned with radiation given his ongoing back pain/concerns for inability to lay on the table for radiation.  Will review tumor conference.   # Peripheral neuropathy/back pain [pain clinic Dr.Neivro]-worse; however limited options as per patient.  Continues to be in hydrocodone every 8 hours.  Patient concerned about narcotic addiction.  Would recommend evaluation with palliative care [name] to help address pain issues/so that patient can proceed with radiation.   # COPD on O2/nasal cannula.  Stable.  # Bladder cancer- superficial-stable  # DISPOSITION:  # refer to [name] next early week-Virtual re: pain control # Follow TBD- Dr.B   Follow-up instructions:  I discussed the assessment and treatment plan with the patient.  The patient was provided an opportunity to ask questions and all were answered.  The patient agreed with the plan and demonstrated understanding of instructions.  The patient was advised to call back or seek an in person evaluation if the symptoms worsen or if the condition fails to improve as anticipated.  Dr. Charlaine Dalton [facility] 02/28/2020 6:18 PM

## 2020-02-26 NOTE — Progress Notes (Signed)
Patient called/ pre- screened for virtual appoinment today with oncologist. Reports  chronic genereralized pain.

## 2020-03-02 ENCOUNTER — Inpatient Hospital Stay (HOSPITAL_BASED_OUTPATIENT_CLINIC_OR_DEPARTMENT_OTHER): Payer: Medicare Other | Admitting: Hospice and Palliative Medicine

## 2020-03-02 DIAGNOSIS — C3411 Malignant neoplasm of upper lobe, right bronchus or lung: Secondary | ICD-10-CM

## 2020-03-02 DIAGNOSIS — T451X5A Adverse effect of antineoplastic and immunosuppressive drugs, initial encounter: Secondary | ICD-10-CM

## 2020-03-02 DIAGNOSIS — G620 Drug-induced polyneuropathy: Secondary | ICD-10-CM

## 2020-03-02 DIAGNOSIS — C3491 Malignant neoplasm of unspecified part of right bronchus or lung: Secondary | ICD-10-CM | POA: Diagnosis not present

## 2020-03-02 MED ORDER — HYDROCODONE-ACETAMINOPHEN 5-325 MG PO TABS: 1.0000 | ORAL_TABLET | Freq: Four times a day (QID) | ORAL | 0 refills | Status: DC | PRN

## 2020-03-02 NOTE — Progress Notes (Signed)
Virtual Visit via Video Note  I connected with Joseph Graham on 03/02/20 at  1:30 PM EDT by a video enabled telemedicine application and verified that I am speaking with the correct person using two identifiers.   I discussed the limitations of evaluation and management by telemedicine and the availability of in person appointments. The patient expressed understanding and agreed to proceed.  History of Present Illness: Joseph Graham is a 79 year old man with multiple medical problems including O2 dependent COPD, CAD, PAD, peripheral neuropathy, and lumbar stenosis with chronic back pain.  Additionally, patient has stage II lung cancer with CT of the chest on 02/24/2020 revealing interval growth and pulmonary nodules.  Plan is for XRT but patient's back pain has limited this option to date.  He is referred to palliative care to address goals and manage ongoing symptoms.   Observations/Objective: Patient is also followed by pain management with Dr. Dossie Arbour.  Per last note, there was consideration of a myelogram but patient could not tolerate and so canceled this procedure.  We discussed patient's pain in detail today.  He has chronic lower back pain which she rates as severe.  Pain is primarily in the low back and is exacerbated with movement.  He sleeps in a recliner due to pain.  He additionally endorses numbness and tingling to the legs and feet.  Patient was previously on oxycodone but he had some fear that it would lead to addiction given his history of EtOH abuse.  He is now on Norco 5-325mg , which he takes 2-3 times a day.  He reports that the Norco helps but does not make the pain tolerable.  He felt like the oxycodone was more effective.  Patient feels like oxycodone could make XRT tolerable.  However, he only wants to take the oxycodone through XRT treatments, which are not yet scheduled.  In the interim, he wants to continue the Norco.  I explained that he could not be on both Norco and  oxycodone simultaneously.  I will refill the Norco today with plan of a short course of oxycodone in the future when XRT is scheduled.  Assessment and Plan: Lung cancer -pending initiation of XRT.  Patient will be presented to the tumor board tomorrow by Dr. Rogue Bussing.  We will plan adjustment of his pain regimen to allow for tolerability of XRT.  Chronic back pain -we will refill Norco today and plan for rotating to oxycodone in the future when patient is scheduled for XRT. PDMP reviewed. Continue gabapentin.   ACP -will benefit from future conversation  Case and plan discussed with Dr. Rogue Bussing  Follow Up Instructions: Follow-up MyChart visit in 3 weeks   I discussed the assessment and treatment plan with the patient. The patient was provided an opportunity to ask questions and all were answered. The patient agreed with the plan and demonstrated an understanding of the instructions.   The patient was advised to call back or seek an in-person evaluation if the symptoms worsen or if the condition fails to improve as anticipated.  I provided 20 minutes of non-face-to-face time during this encounter.   Irean Hong, NP

## 2020-03-03 ENCOUNTER — Other Ambulatory Visit: Payer: Medicare Other

## 2020-03-04 NOTE — Progress Notes (Signed)
Tumor Board Documentation  Joseph Graham was presented by Dr Rogue Bussing at our Tumor Board on 03/03/2020, which included representatives from medical oncology, radiation oncology, surgical oncology, internal medicine, navigation, pathology, radiology, surgical, pharmacy, research, palliative care.  Joseph Graham currently presents as a current patient, for discussion with history of the following treatments: active survellience, neoadjuvant radiation.  Additionally, we reviewed previous medical and familial history, history of present illness, and recent lab results along with all available histopathologic and imaging studies. The tumor board considered available treatment options and made the following recommendations: Radiation therapy (primary modality), Additional screening, Immunotherapy (PET Scan)    The following procedures/referrals were also placed: No orders of the defined types were placed in this encounter.   Clinical Trial Status: not discussed   Staging used: AJCC Stage Group  AJCC Staging: T: 3     Group: Stage II  Lung cancer   National site-specific guidelines NCCN were discussed with respect to the case.  Tumor board is a meeting of clinicians from various specialty areas who evaluate and discuss patients for whom a multidisciplinary approach is being considered. Final determinations in the plan of care are those of the provider(s). The responsibility for follow up of recommendations given during tumor board is that of the provider.   Today's extended care, comprehensive team conference, Joseph Graham was not present for the discussion and was not examined.   Multidisciplinary Tumor Board is a multidisciplinary case peer review process.  Decisions discussed in the Multidisciplinary Tumor Board reflect the opinions of the specialists present at the conference without having examined the patient.  Ultimately, treatment and diagnostic decisions rest with the primary provider(s) and the  patient.

## 2020-03-07 ENCOUNTER — Telehealth: Payer: Self-pay | Admitting: Internal Medicine

## 2020-03-07 ENCOUNTER — Other Ambulatory Visit: Payer: Self-pay | Admitting: Internal Medicine

## 2020-03-07 DIAGNOSIS — R918 Other nonspecific abnormal finding of lung field: Secondary | ICD-10-CM

## 2020-03-07 DIAGNOSIS — C3411 Malignant neoplasm of upper lobe, right bronchus or lung: Secondary | ICD-10-CM

## 2020-03-07 NOTE — Telephone Encounter (Signed)
Pt has been made aware of all scheduled appts. Nothing further needed at this time.

## 2020-03-07 NOTE — Telephone Encounter (Signed)
On 9/24-spoke to patient regarding the discussion at the tumor conference re; radiation to the right lower lobe lung mass.  However recommend a PET scan.  C-please schedule PET scan ASAP; referral to radiation oncology 1 to 2 days after the PET scan.  Haley-please follow-up/inform the pt of above plan.   [name]-appreciate if you can coordinate his pain management as per previous discussions.   Thanks, GB

## 2020-03-08 ENCOUNTER — Encounter: Payer: Self-pay | Admitting: Family Medicine

## 2020-03-08 ENCOUNTER — Encounter: Payer: Self-pay | Admitting: Internal Medicine

## 2020-03-09 ENCOUNTER — Telehealth: Payer: Self-pay | Admitting: Internal Medicine

## 2020-03-09 MED ORDER — OXYCODONE-ACETAMINOPHEN 10-325 MG PO TABS: ORAL_TABLET | ORAL | 0 refills | Status: DC

## 2020-03-09 NOTE — Telephone Encounter (Signed)
On 9/28-Long discussion with patient regarding his concerns of inadequate pain control while going through imaging/radiation treatments.  Recommend use of Percocet 10/325 1-2 hours before any of the imaging/radiation treatment.  Prescription sent.  Patient will use Percocet only as ordered.  Otherwise he will continue taking hydrocodone as prescribed for the rest of the days.  He understands that he will not take both medication the same time.   [name]- please follow; and adjust his pain medications based on his pain control after the PET scan, I think on 10/05.   Thanks GB

## 2020-03-10 ENCOUNTER — Other Ambulatory Visit: Payer: Medicare Other

## 2020-03-11 ENCOUNTER — Telehealth: Payer: Medicare Other | Admitting: Internal Medicine

## 2020-03-15 ENCOUNTER — Encounter: Payer: Medicare Other | Admitting: Pain Medicine

## 2020-03-15 ENCOUNTER — Institutional Professional Consult (permissible substitution): Payer: Medicare Other | Admitting: Radiation Oncology

## 2020-03-15 ENCOUNTER — Other Ambulatory Visit: Payer: Self-pay

## 2020-03-15 ENCOUNTER — Encounter
Admission: RE | Admit: 2020-03-15 | Discharge: 2020-03-15 | Disposition: A | Discharge status: Home / Self Care | Payer: Medicare Other | Source: Ambulatory Visit | Attending: Internal Medicine | Admitting: Internal Medicine

## 2020-03-15 DIAGNOSIS — R918 Other nonspecific abnormal finding of lung field: Secondary | ICD-10-CM | POA: Insufficient documentation

## 2020-03-15 DIAGNOSIS — C3411 Malignant neoplasm of upper lobe, right bronchus or lung: Secondary | ICD-10-CM | POA: Diagnosis not present

## 2020-03-15 DIAGNOSIS — I2510 Atherosclerotic heart disease of native coronary artery without angina pectoris: Secondary | ICD-10-CM | POA: Diagnosis not present

## 2020-03-15 DIAGNOSIS — I700 Atherosclerosis of aorta: Secondary | ICD-10-CM | POA: Diagnosis not present

## 2020-03-15 DIAGNOSIS — C3490 Malignant neoplasm of unspecified part of unspecified bronchus or lung: Secondary | ICD-10-CM | POA: Diagnosis not present

## 2020-03-15 DIAGNOSIS — J439 Emphysema, unspecified: Secondary | ICD-10-CM | POA: Diagnosis not present

## 2020-03-15 LAB — GLUCOSE, CAPILLARY: Glucose-Capillary: 111 mg/dL — ABNORMAL HIGH (ref 70–99)

## 2020-03-15 MED ORDER — FLUDEOXYGLUCOSE F - 18 (FDG) INJECTION
9.3000 | Freq: Once | INTRAVENOUS | Status: AC | PRN
  Administered 2020-03-15: 9.85 via INTRAVENOUS

## 2020-03-16 ENCOUNTER — Other Ambulatory Visit: Payer: Self-pay | Admitting: Licensed Clinical Social Worker

## 2020-03-16 ENCOUNTER — Ambulatory Visit
Admission: RE | Admit: 2020-03-16 | Discharge: 2020-03-16 | Disposition: A | Discharge status: Home / Self Care | Payer: Medicare Other | Source: Ambulatory Visit | Attending: Radiation Oncology | Admitting: Radiation Oncology

## 2020-03-16 ENCOUNTER — Encounter: Payer: Self-pay | Admitting: Radiation Oncology

## 2020-03-16 ENCOUNTER — Institutional Professional Consult (permissible substitution): Payer: Medicare Other | Admitting: Radiation Oncology

## 2020-03-16 VITALS — BP 125/53 | HR 60 | Resp 16

## 2020-03-16 DIAGNOSIS — M549 Dorsalgia, unspecified: Secondary | ICD-10-CM | POA: Insufficient documentation

## 2020-03-16 DIAGNOSIS — I2510 Atherosclerotic heart disease of native coronary artery without angina pectoris: Secondary | ICD-10-CM | POA: Insufficient documentation

## 2020-03-16 DIAGNOSIS — C7801 Secondary malignant neoplasm of right lung: Secondary | ICD-10-CM | POA: Insufficient documentation

## 2020-03-16 DIAGNOSIS — Z87891 Personal history of nicotine dependence: Secondary | ICD-10-CM | POA: Diagnosis not present

## 2020-03-16 DIAGNOSIS — H353222 Exudative age-related macular degeneration, left eye, with inactive choroidal neovascularization: Secondary | ICD-10-CM | POA: Diagnosis not present

## 2020-03-16 DIAGNOSIS — C3411 Malignant neoplasm of upper lobe, right bronchus or lung: Secondary | ICD-10-CM

## 2020-03-16 DIAGNOSIS — H353211 Exudative age-related macular degeneration, right eye, with active choroidal neovascularization: Secondary | ICD-10-CM | POA: Diagnosis not present

## 2020-03-16 DIAGNOSIS — J449 Chronic obstructive pulmonary disease, unspecified: Secondary | ICD-10-CM | POA: Insufficient documentation

## 2020-03-16 DIAGNOSIS — G629 Polyneuropathy, unspecified: Secondary | ICD-10-CM | POA: Diagnosis not present

## 2020-03-16 DIAGNOSIS — R918 Other nonspecific abnormal finding of lung field: Secondary | ICD-10-CM
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
## 2020-03-18 ENCOUNTER — Telehealth: Payer: Self-pay | Admitting: Internal Medicine

## 2020-03-18 NOTE — Telephone Encounter (Signed)
On 10/06-spoke to patient regarding the results of the PET scan.  Recommend proceeding with SBRT of the right lower lobe lung nodule.   Recommend follow-up with [name]-for pain control during the time of the radiation treatments.  Haley-please have the patient follow-up in approximately a month or so after his radiation.  Virtual visit should be okay.   Thanks GB

## 2020-03-18 NOTE — Telephone Encounter (Signed)
Colette - Please schedule follow-up visit-mychart 1 month after radiation

## 2020-03-21 ENCOUNTER — Encounter: Payer: Medicare Other | Admitting: Pain Medicine

## 2020-03-22 ENCOUNTER — Ambulatory Visit
Admission: RE | Admit: 2020-03-22 | Discharge: 2020-03-22 | Disposition: A | Discharge status: Home / Self Care | Payer: Medicare Other | Source: Ambulatory Visit | Attending: Radiation Oncology | Admitting: Radiation Oncology

## 2020-03-22 DIAGNOSIS — Z510 Encounter for antineoplastic radiation therapy: Secondary | ICD-10-CM | POA: Insufficient documentation

## 2020-03-22 DIAGNOSIS — C3411 Malignant neoplasm of upper lobe, right bronchus or lung: Secondary | ICD-10-CM | POA: Insufficient documentation

## 2020-03-22 DIAGNOSIS — C7801 Secondary malignant neoplasm of right lung: Secondary | ICD-10-CM | POA: Diagnosis not present

## 2020-03-22 DIAGNOSIS — Z87891 Personal history of nicotine dependence: Secondary | ICD-10-CM | POA: Diagnosis not present

## 2020-03-23 ENCOUNTER — Inpatient Hospital Stay: Payer: Medicare Other | Attending: Hospice and Palliative Medicine | Admitting: Hospice and Palliative Medicine

## 2020-03-23 DIAGNOSIS — G893 Neoplasm related pain (acute) (chronic): Secondary | ICD-10-CM | POA: Diagnosis not present

## 2020-03-23 DIAGNOSIS — C3411 Malignant neoplasm of upper lobe, right bronchus or lung: Secondary | ICD-10-CM

## 2020-03-23 DIAGNOSIS — Z515 Encounter for palliative care: Secondary | ICD-10-CM

## 2020-03-23 NOTE — Progress Notes (Signed)
Virtual Visit via Video Note  I connected with [name] on 03/23/20 at  2:30 PM EDT by a video enabled telemedicine application and verified that I am speaking with the correct person using two identifiers.   I discussed the limitations of evaluation and management by telemedicine and the availability of in person appointments. The patient expressed understanding and agreed to proceed.  History of Present Illness: [name] is a 79 year old man with multiple medical problems including O2 dependent COPD, CAD, PAD, peripheral neuropathy, and lumbar stenosis with chronic back pain.  Additionally, patient has stage II lung cancer with CT of the chest on 02/24/2020 revealing interval growth and pulmonary nodules.  Plan is for XRT but patient's back pain has limited this option to date.  He is referred to palliative care to address goals and manage ongoing symptoms.   Observations/Objective: Routine follow-up visit.  Patient was seen virtually.  Patient denies any significant changes or concerns since we last spoke.  He continues to endorse chronic back pain but says it is unchanged in characterization.  Pain was slightly worse following his simulation for XRT.  Patient is currently taking Norco twice daily.  He plans to utilize Percocet on days that will require XRT.  Patient continues to endorse fear of overutilizing pain medications despite ongoing pain.  However, he is comfortable with the current regimen as outlined by Dr. Rogue Bussing.  Assessment and Plan: Lung cancer -pending initiation of XRT.    Chronic back pain -continue Norco/gabapentin.  Patient will utilize Percocet on days that require XRT.  He has medications available to him in the home and verbalized agreement with this plan.  ACP -will benefit from future conversation  Case and plan discussed with Dr. Rogue Bussing  Follow Up Instructions: Follow-up MyChart visit in 2 weeks   I discussed the assessment and treatment  plan with the patient. The patient was provided an opportunity to ask questions and all were answered. The patient agreed with the plan and demonstrated an understanding of the instructions.   The patient was advised to call back or seek an in-person evaluation if the symptoms worsen or if the condition fails to improve as anticipated.  I provided 15 minutes of non-face-to-face time during this encounter.   Irean Hong, NP

## 2020-03-28 DIAGNOSIS — Z87891 Personal history of nicotine dependence: Secondary | ICD-10-CM | POA: Diagnosis not present

## 2020-03-28 DIAGNOSIS — C3411 Malignant neoplasm of upper lobe, right bronchus or lung: Secondary | ICD-10-CM | POA: Diagnosis not present

## 2020-03-28 DIAGNOSIS — C7801 Secondary malignant neoplasm of right lung: Secondary | ICD-10-CM | POA: Diagnosis not present

## 2020-03-28 DIAGNOSIS — Z510 Encounter for antineoplastic radiation therapy: Secondary | ICD-10-CM | POA: Diagnosis not present

## 2020-04-04 ENCOUNTER — Ambulatory Visit: Payer: Medicare Other

## 2020-04-05 NOTE — Progress Notes (Signed)
I reviewed health advisor's note, was available for consultation, and agree with documentation and plan.  

## 2020-04-05 NOTE — Progress Notes (Signed)
I have collaborated with the care management provider regarding care management and care coordination activities outlined in this encounter and have reviewed this encounter including documentation in the note and care plan. I am certifying that I agree with the content of this note and encounter as supervising physician.  

## 2020-04-06 ENCOUNTER — Ambulatory Visit: Payer: Medicare Other

## 2020-04-06 ENCOUNTER — Inpatient Hospital Stay (HOSPITAL_BASED_OUTPATIENT_CLINIC_OR_DEPARTMENT_OTHER): Payer: Medicare Other | Admitting: Hospice and Palliative Medicine

## 2020-04-06 DIAGNOSIS — C3411 Malignant neoplasm of upper lobe, right bronchus or lung: Secondary | ICD-10-CM | POA: Diagnosis not present

## 2020-04-06 DIAGNOSIS — G893 Neoplasm related pain (acute) (chronic): Secondary | ICD-10-CM | POA: Diagnosis not present

## 2020-04-06 DIAGNOSIS — Z515 Encounter for palliative care: Secondary | ICD-10-CM

## 2020-04-06 NOTE — Progress Notes (Signed)
Virtual Visit via Video Note  I connected with Joseph Graham on 04/06/20 at 11:30 AM EDT by a video enabled telemedicine application and verified that I am speaking with the correct person using two identifiers.   I discussed the limitations of evaluation and management by telemedicine and the availability of in person appointments. The patient expressed understanding and agreed to proceed.  History of Present Illness: [name] is a 78 year old man with multiple medical problems including O2 dependent COPD, CAD, PAD, peripheral neuropathy, and lumbar stenosis with chronic back pain.  Additionally, patient has stage II lung cancer with CT of the chest on 02/24/2020 revealing interval growth and pulmonary nodules.  Plan is for XRT but patient's back pain has limited this option to date.  He is referred to palliative care to address goals and manage ongoing symptoms.   Observations/Objective: Routine follow-up visit.  Patient was seen virtually.  Patient endorses persistent back pain.  He feels like it somewhat worse this week but he is not interested in adjustment of his pain medications today.  He was supposed to start XRT this week but treatments were delayed due to an insurance peer-to-peer requirement.  Patient is currently scheduled to start XRT next week.  Will plan to continue current pain regimen and follow-up virtually next week.  Assessment and Plan: Lung cancer -pending initiation of XRT.    Chronic back pain -continue Norco/gabapentin.  Patient will utilize Percocet on days that require XRT.  He has medications available to him in the home and verbalized agreement with this plan.  ACP -will benefit from future conversation  Case and plan discussed with Dr. Rogue Bussing  Follow Up Instructions: Follow-up MyChart visit next week   I discussed the assessment and treatment plan with the patient. The patient was provided an opportunity to ask questions and all were answered.  The patient agreed with the plan and demonstrated an understanding of the instructions.   The patient was advised to call back or seek an in-person evaluation if the symptoms worsen or if the condition fails to improve as anticipated.  I provided 10 minutes of non-face-to-face time during this encounter.   Irean Hong, NP

## 2020-04-08 ENCOUNTER — Ambulatory Visit: Payer: Medicare Other

## 2020-04-11 ENCOUNTER — Ambulatory Visit
Admission: RE | Admit: 2020-04-11 | Discharge: 2020-04-11 | Disposition: A | Discharge status: Home / Self Care | Payer: Medicare Other | Source: Ambulatory Visit | Attending: Radiation Oncology | Admitting: Radiation Oncology

## 2020-04-11 DIAGNOSIS — C3411 Malignant neoplasm of upper lobe, right bronchus or lung: Secondary | ICD-10-CM | POA: Diagnosis not present

## 2020-04-11 DIAGNOSIS — C7801 Secondary malignant neoplasm of right lung: Secondary | ICD-10-CM | POA: Insufficient documentation

## 2020-04-11 DIAGNOSIS — Z510 Encounter for antineoplastic radiation therapy: Secondary | ICD-10-CM | POA: Insufficient documentation

## 2020-04-11 DIAGNOSIS — Z87891 Personal history of nicotine dependence: Secondary | ICD-10-CM | POA: Insufficient documentation

## 2020-04-12 ENCOUNTER — Ambulatory Visit: Payer: Medicare Other

## 2020-04-13 ENCOUNTER — Ambulatory Visit: Payer: Medicare Other

## 2020-04-13 ENCOUNTER — Ambulatory Visit
Admission: RE | Admit: 2020-04-13 | Discharge: 2020-04-13 | Disposition: A | Discharge status: Home / Self Care | Payer: Medicare Other | Source: Ambulatory Visit | Attending: Radiation Oncology | Admitting: Radiation Oncology

## 2020-04-13 DIAGNOSIS — G250 Essential tremor: Secondary | ICD-10-CM | POA: Diagnosis not present

## 2020-04-13 DIAGNOSIS — R6889 Other general symptoms and signs: Secondary | ICD-10-CM | POA: Diagnosis not present

## 2020-04-13 DIAGNOSIS — G620 Drug-induced polyneuropathy: Secondary | ICD-10-CM | POA: Diagnosis not present

## 2020-04-13 DIAGNOSIS — K219 Gastro-esophageal reflux disease without esophagitis: Secondary | ICD-10-CM | POA: Diagnosis not present

## 2020-04-13 DIAGNOSIS — Z86010 Personal history of colonic polyps: Secondary | ICD-10-CM | POA: Diagnosis not present

## 2020-04-13 DIAGNOSIS — Z87442 Personal history of urinary calculi: Secondary | ICD-10-CM | POA: Diagnosis not present

## 2020-04-13 DIAGNOSIS — J939 Pneumothorax, unspecified: Secondary | ICD-10-CM | POA: Diagnosis not present

## 2020-04-13 DIAGNOSIS — Z9221 Personal history of antineoplastic chemotherapy: Secondary | ICD-10-CM | POA: Diagnosis not present

## 2020-04-13 DIAGNOSIS — M1990 Unspecified osteoarthritis, unspecified site: Secondary | ICD-10-CM | POA: Diagnosis not present

## 2020-04-13 DIAGNOSIS — J95811 Postprocedural pneumothorax: Secondary | ICD-10-CM | POA: Diagnosis not present

## 2020-04-13 DIAGNOSIS — R0602 Shortness of breath: Secondary | ICD-10-CM | POA: Diagnosis not present

## 2020-04-13 DIAGNOSIS — J441 Chronic obstructive pulmonary disease with (acute) exacerbation: Secondary | ICD-10-CM | POA: Diagnosis not present

## 2020-04-13 DIAGNOSIS — G893 Neoplasm related pain (acute) (chronic): Secondary | ICD-10-CM | POA: Diagnosis not present

## 2020-04-13 DIAGNOSIS — R0902 Hypoxemia: Secondary | ICD-10-CM | POA: Diagnosis not present

## 2020-04-13 DIAGNOSIS — Z515 Encounter for palliative care: Secondary | ICD-10-CM | POA: Diagnosis not present

## 2020-04-13 DIAGNOSIS — J449 Chronic obstructive pulmonary disease, unspecified: Secondary | ICD-10-CM | POA: Diagnosis not present

## 2020-04-13 DIAGNOSIS — C3411 Malignant neoplasm of upper lobe, right bronchus or lung: Secondary | ICD-10-CM | POA: Diagnosis not present

## 2020-04-13 DIAGNOSIS — M545 Low back pain, unspecified: Secondary | ICD-10-CM | POA: Diagnosis not present

## 2020-04-13 DIAGNOSIS — T451X5A Adverse effect of antineoplastic and immunosuppressive drugs, initial encounter: Secondary | ICD-10-CM | POA: Diagnosis not present

## 2020-04-13 DIAGNOSIS — I2510 Atherosclerotic heart disease of native coronary artery without angina pectoris: Secondary | ICD-10-CM | POA: Diagnosis not present

## 2020-04-13 DIAGNOSIS — M542 Cervicalgia: Secondary | ICD-10-CM | POA: Diagnosis not present

## 2020-04-13 DIAGNOSIS — J9621 Acute and chronic respiratory failure with hypoxia: Secondary | ICD-10-CM | POA: Diagnosis not present

## 2020-04-13 DIAGNOSIS — R0789 Other chest pain: Secondary | ICD-10-CM | POA: Diagnosis not present

## 2020-04-13 DIAGNOSIS — G894 Chronic pain syndrome: Secondary | ICD-10-CM | POA: Diagnosis not present

## 2020-04-13 DIAGNOSIS — Z4682 Encounter for fitting and adjustment of non-vascular catheter: Secondary | ICD-10-CM | POA: Diagnosis not present

## 2020-04-13 DIAGNOSIS — J948 Other specified pleural conditions: Secondary | ICD-10-CM | POA: Diagnosis not present

## 2020-04-13 DIAGNOSIS — J439 Emphysema, unspecified: Secondary | ICD-10-CM | POA: Diagnosis not present

## 2020-04-13 DIAGNOSIS — I4510 Unspecified right bundle-branch block: Secondary | ICD-10-CM | POA: Diagnosis not present

## 2020-04-13 DIAGNOSIS — H3530 Unspecified macular degeneration: Secondary | ICD-10-CM | POA: Diagnosis not present

## 2020-04-13 DIAGNOSIS — E785 Hyperlipidemia, unspecified: Secondary | ICD-10-CM | POA: Diagnosis not present

## 2020-04-13 DIAGNOSIS — I10 Essential (primary) hypertension: Secondary | ICD-10-CM | POA: Diagnosis not present

## 2020-04-13 DIAGNOSIS — J9312 Secondary spontaneous pneumothorax: Secondary | ICD-10-CM | POA: Diagnosis not present

## 2020-04-13 DIAGNOSIS — R079 Chest pain, unspecified: Secondary | ICD-10-CM | POA: Diagnosis not present

## 2020-04-14 ENCOUNTER — Inpatient Hospital Stay: Payer: Medicare Other | Attending: Hospice and Palliative Medicine | Admitting: Hospice and Palliative Medicine

## 2020-04-14 ENCOUNTER — Ambulatory Visit: Payer: Medicare Other

## 2020-04-14 DIAGNOSIS — Z515 Encounter for palliative care: Secondary | ICD-10-CM

## 2020-04-14 DIAGNOSIS — G893 Neoplasm related pain (acute) (chronic): Secondary | ICD-10-CM

## 2020-04-14 MED ORDER — FENTANYL 12 MCG/HR TD PT72
1.0000 | MEDICATED_PATCH | TRANSDERMAL | 0 refills | Status: DC
Start: 2020-04-14 — End: 2020-04-20

## 2020-04-14 MED ORDER — NALOXONE HCL 4 MG/0.1ML NA LIQD: NASAL | 0 refills | Status: DC

## 2020-04-14 NOTE — Progress Notes (Signed)
Virtual Visit via Video Note  I connected with Joseph Graham on 04/14/20 at  9:30 AM EDT by a video enabled telemedicine application and verified that I am speaking with the correct person using two identifiers.   I discussed the limitations of evaluation and management by telemedicine and the availability of in person appointments. The patient expressed understanding and agreed to proceed.  History of Present Illness: [name] is a 79 year old man with multiple medical problems including O2 dependent COPD, CAD, PAD, peripheral neuropathy, and lumbar stenosis with chronic back pain.  Additionally, patient has stage II lung cancer with CT of the chest on 02/24/2020 revealing interval growth and pulmonary nodules.  Plan is for XRT but patient's back pain has limited this option to date.  He is referred to palliative care to address goals and manage ongoing symptoms.   Observations/Objective: Routine follow-up visit.  Patient was seen virtually.  Patient started XRT this week.  He has had 2 sessions.  He reports significantly worse pain in back.  He has been taking accommodation of Norco and Percocet without significant relief.  He says pain is currently 7-8 out of 10 but has been as high as "11 out of 10."  We discussed various options for adjustment of his pain regimen including increasing dose and/or frequency of Percocet versus starting him on a long-acting opioid.  Patient is interested in trial of an LAO.  We will start transdermal fentanyl.  Assessment and Plan: Lung cancer -pending initiation of XRT.    Chronic back pain -continue Percocet/gabapentin.  We will start transdermal fentanyl 12 mcg every 72 hours for 2 weeks.  Naloxone kit.   ACP -will benefit from future conversation  Case and plan discussed with Dr. Rogue Bussing  Follow Up Instructions: Follow-up MyChart visit next week   I discussed the assessment and treatment plan with the patient. The patient was provided an  opportunity to ask questions and all were answered. The patient agreed with the plan and demonstrated an understanding of the instructions.   The patient was advised to call back or seek an in-person evaluation if the symptoms worsen or if the condition fails to improve as anticipated.  I provided 25 minutes of non-face-to-face time during this encounter.   Irean Hong, NP

## 2020-04-15 ENCOUNTER — Emergency Department: Payer: Medicare Other

## 2020-04-15 ENCOUNTER — Inpatient Hospital Stay
Admission: EM | Admit: 2020-04-15 | Discharge: 2020-04-18 | DRG: 191 | Disposition: A | Discharge status: Home / Self Care | Payer: Medicare Other | Attending: Cardiothoracic Surgery | Admitting: Cardiothoracic Surgery

## 2020-04-15 ENCOUNTER — Other Ambulatory Visit: Payer: Self-pay

## 2020-04-15 ENCOUNTER — Ambulatory Visit: Payer: Medicare Other

## 2020-04-15 DIAGNOSIS — R0602 Shortness of breath: Secondary | ICD-10-CM

## 2020-04-15 DIAGNOSIS — M1990 Unspecified osteoarthritis, unspecified site: Secondary | ICD-10-CM | POA: Diagnosis present

## 2020-04-15 DIAGNOSIS — G893 Neoplasm related pain (acute) (chronic): Secondary | ICD-10-CM | POA: Diagnosis present

## 2020-04-15 DIAGNOSIS — Z4682 Encounter for fitting and adjustment of non-vascular catheter: Secondary | ICD-10-CM | POA: Diagnosis not present

## 2020-04-15 DIAGNOSIS — Z87891 Personal history of nicotine dependence: Secondary | ICD-10-CM

## 2020-04-15 DIAGNOSIS — K219 Gastro-esophageal reflux disease without esophagitis: Secondary | ICD-10-CM | POA: Diagnosis present

## 2020-04-15 DIAGNOSIS — N400 Enlarged prostate without lower urinary tract symptoms: Secondary | ICD-10-CM | POA: Diagnosis present

## 2020-04-15 DIAGNOSIS — H3530 Unspecified macular degeneration: Secondary | ICD-10-CM | POA: Diagnosis present

## 2020-04-15 DIAGNOSIS — M542 Cervicalgia: Secondary | ICD-10-CM | POA: Diagnosis present

## 2020-04-15 DIAGNOSIS — Z8551 Personal history of malignant neoplasm of bladder: Secondary | ICD-10-CM

## 2020-04-15 DIAGNOSIS — C3411 Malignant neoplasm of upper lobe, right bronchus or lung: Secondary | ICD-10-CM | POA: Diagnosis present

## 2020-04-15 DIAGNOSIS — G620 Drug-induced polyneuropathy: Secondary | ICD-10-CM | POA: Diagnosis present

## 2020-04-15 DIAGNOSIS — I4510 Unspecified right bundle-branch block: Secondary | ICD-10-CM | POA: Diagnosis not present

## 2020-04-15 DIAGNOSIS — J9611 Chronic respiratory failure with hypoxia: Secondary | ICD-10-CM | POA: Diagnosis present

## 2020-04-15 DIAGNOSIS — T451X5A Adverse effect of antineoplastic and immunosuppressive drugs, initial encounter: Secondary | ICD-10-CM | POA: Diagnosis present

## 2020-04-15 DIAGNOSIS — M545 Low back pain, unspecified: Secondary | ICD-10-CM | POA: Diagnosis present

## 2020-04-15 DIAGNOSIS — G894 Chronic pain syndrome: Secondary | ICD-10-CM | POA: Diagnosis present

## 2020-04-15 DIAGNOSIS — Z9981 Dependence on supplemental oxygen: Secondary | ICD-10-CM | POA: Diagnosis not present

## 2020-04-15 DIAGNOSIS — R0902 Hypoxemia: Secondary | ICD-10-CM | POA: Diagnosis not present

## 2020-04-15 DIAGNOSIS — Z7952 Long term (current) use of systemic steroids: Secondary | ICD-10-CM

## 2020-04-15 DIAGNOSIS — I10 Essential (primary) hypertension: Secondary | ICD-10-CM | POA: Diagnosis present

## 2020-04-15 DIAGNOSIS — J9312 Secondary spontaneous pneumothorax: Secondary | ICD-10-CM | POA: Diagnosis present

## 2020-04-15 DIAGNOSIS — J439 Emphysema, unspecified: Principal | ICD-10-CM | POA: Diagnosis present

## 2020-04-15 DIAGNOSIS — Z86010 Personal history of colonic polyps: Secondary | ICD-10-CM | POA: Diagnosis not present

## 2020-04-15 DIAGNOSIS — Z9221 Personal history of antineoplastic chemotherapy: Secondary | ICD-10-CM | POA: Diagnosis not present

## 2020-04-15 DIAGNOSIS — Z87442 Personal history of urinary calculi: Secondary | ICD-10-CM | POA: Diagnosis not present

## 2020-04-15 DIAGNOSIS — F1011 Alcohol abuse, in remission: Secondary | ICD-10-CM | POA: Diagnosis present

## 2020-04-15 DIAGNOSIS — J948 Other specified pleural conditions: Secondary | ICD-10-CM | POA: Diagnosis present

## 2020-04-15 DIAGNOSIS — E785 Hyperlipidemia, unspecified: Secondary | ICD-10-CM | POA: Diagnosis present

## 2020-04-15 DIAGNOSIS — C3490 Malignant neoplasm of unspecified part of unspecified bronchus or lung: Secondary | ICD-10-CM

## 2020-04-15 DIAGNOSIS — G250 Essential tremor: Secondary | ICD-10-CM | POA: Diagnosis not present

## 2020-04-15 DIAGNOSIS — J939 Pneumothorax, unspecified: Secondary | ICD-10-CM | POA: Diagnosis not present

## 2020-04-15 DIAGNOSIS — Z79899 Other long term (current) drug therapy: Secondary | ICD-10-CM

## 2020-04-15 DIAGNOSIS — I2510 Atherosclerotic heart disease of native coronary artery without angina pectoris: Secondary | ICD-10-CM | POA: Diagnosis present

## 2020-04-15 DIAGNOSIS — J95811 Postprocedural pneumothorax: Secondary | ICD-10-CM | POA: Diagnosis not present

## 2020-04-15 DIAGNOSIS — Z8249 Family history of ischemic heart disease and other diseases of the circulatory system: Secondary | ICD-10-CM

## 2020-04-15 DIAGNOSIS — J449 Chronic obstructive pulmonary disease, unspecified: Secondary | ICD-10-CM | POA: Diagnosis present

## 2020-04-15 DIAGNOSIS — R7303 Prediabetes: Secondary | ICD-10-CM | POA: Diagnosis present

## 2020-04-15 DIAGNOSIS — J9621 Acute and chronic respiratory failure with hypoxia: Secondary | ICD-10-CM

## 2020-04-15 DIAGNOSIS — Z7982 Long term (current) use of aspirin: Secondary | ICD-10-CM

## 2020-04-15 DIAGNOSIS — R079 Chest pain, unspecified: Secondary | ICD-10-CM | POA: Diagnosis not present

## 2020-04-15 DIAGNOSIS — Z20822 Contact with and (suspected) exposure to covid-19: Secondary | ICD-10-CM | POA: Diagnosis present

## 2020-04-15 DIAGNOSIS — R6889 Other general symptoms and signs: Secondary | ICD-10-CM | POA: Diagnosis not present

## 2020-04-15 DIAGNOSIS — R0789 Other chest pain: Secondary | ICD-10-CM | POA: Diagnosis not present

## 2020-04-15 DIAGNOSIS — J441 Chronic obstructive pulmonary disease with (acute) exacerbation: Secondary | ICD-10-CM | POA: Diagnosis not present

## 2020-04-15 DIAGNOSIS — Z8052 Family history of malignant neoplasm of bladder: Secondary | ICD-10-CM

## 2020-04-15 LAB — CBC WITH DIFFERENTIAL/PLATELET
Abs Immature Granulocytes: 0.03 10*3/uL (ref 0.00–0.07)
Basophils Absolute: 0 10*3/uL (ref 0.0–0.1)
Basophils Relative: 0 %
Eosinophils Absolute: 0.1 10*3/uL (ref 0.0–0.5)
Eosinophils Relative: 1 %
HCT: 45 % (ref 39.0–52.0)
Hemoglobin: 14.1 g/dL (ref 13.0–17.0)
Immature Granulocytes: 0 %
Lymphocytes Relative: 10 %
Lymphs Abs: 0.9 10*3/uL (ref 0.7–4.0)
MCH: 31.3 pg (ref 26.0–34.0)
MCHC: 31.3 g/dL (ref 30.0–36.0)
MCV: 99.8 fL (ref 80.0–100.0)
Monocytes Absolute: 0.5 10*3/uL (ref 0.1–1.0)
Monocytes Relative: 6 %
Neutro Abs: 7.5 10*3/uL (ref 1.7–7.7)
Neutrophils Relative %: 83 %
Platelets: 243 10*3/uL (ref 150–400)
RBC: 4.51 MIL/uL (ref 4.22–5.81)
RDW: 13 % (ref 11.5–15.5)
WBC: 9 10*3/uL (ref 4.0–10.5)
nRBC: 0 % (ref 0.0–0.2)

## 2020-04-15 LAB — COMPREHENSIVE METABOLIC PANEL
ALT: 19 U/L (ref 0–44)
AST: 26 U/L (ref 15–41)
Albumin: 4.4 g/dL (ref 3.5–5.0)
Alkaline Phosphatase: 144 U/L — ABNORMAL HIGH (ref 38–126)
Anion gap: 11 (ref 5–15)
BUN: 22 mg/dL (ref 8–23)
CO2: 32 mmol/L (ref 22–32)
Calcium: 9.3 mg/dL (ref 8.9–10.3)
Chloride: 94 mmol/L — ABNORMAL LOW (ref 98–111)
Creatinine, Ser: 0.58 mg/dL — ABNORMAL LOW (ref 0.61–1.24)
GFR, Estimated: >60 mL/min (ref >60)
Glucose, Bld: 140 mg/dL — ABNORMAL HIGH (ref 70–99)
Potassium: 4.3 mmol/L (ref 3.5–5.1)
Sodium: 137 mmol/L (ref 135–145)
Total Bilirubin: 0.4 mg/dL (ref 0.3–1.2)
Total Protein: 9.1 g/dL — ABNORMAL HIGH (ref 6.5–8.1)

## 2020-04-15 LAB — RESPIRATORY PANEL BY RT PCR (FLU A&B, COVID)
Influenza A by PCR: NEGATIVE
Influenza B by PCR: NEGATIVE
SARS Coronavirus 2 by RT PCR: NEGATIVE

## 2020-04-15 LAB — TROPONIN I (HIGH SENSITIVITY)
Troponin I (High Sensitivity): 45 ng/L — ABNORMAL HIGH (ref <18)
Troponin I (High Sensitivity): 66 ng/L — ABNORMAL HIGH (ref <18)

## 2020-04-15 MED ORDER — METHYLPREDNISOLONE SODIUM SUCC 125 MG IJ SOLR
125.0000 mg | Freq: Once | INTRAMUSCULAR | Status: AC
  Administered 2020-04-15: 125 mg via INTRAVENOUS
  Filled 2020-04-15: qty 2

## 2020-04-15 MED ORDER — ALBUTEROL SULFATE (2.5 MG/3ML) 0.083% IN NEBU
5.0000 mg | INHALATION_SOLUTION | Freq: Once | RESPIRATORY_TRACT | Status: AC
  Administered 2020-04-15: 5 mg via RESPIRATORY_TRACT
  Filled 2020-04-15: qty 6

## 2020-04-15 MED ORDER — IPRATROPIUM-ALBUTEROL 0.5-2.5 (3) MG/3ML IN SOLN
3.0000 mL | Freq: Once | RESPIRATORY_TRACT | Status: AC
  Administered 2020-04-15: 3 mL via RESPIRATORY_TRACT
  Filled 2020-04-15: qty 3

## 2020-04-15 NOTE — H&P (Signed)
History and Physical   Joseph Graham BMW:[id number] DOB: Jun 18, 1940 DOA: 04/15/2020  Referring MD/NP/PA: Dr. Joni Graham  PCP: [place] Bush, MD   Outpatient Specialists: [place] Graham, radiation oncology  Patient coming from: Home  Chief Complaint: Shortness of breath  HPI: Joseph Graham is a 79 y.o. male with medical history significant of right-sided lung cancer status post chemotherapy currently on radiation therapy, COPD, chronic pain syndrome, benign essential tremor, coronary artery disease, peripheral neuropathy secondary to chemotherapy, previous history of pneumothorax who presented to the ER with worsening shortness of breath and cough.  Patient is normally on 2 L of oxygen at home.  He is requiring up to 4 L today.  Has some chest discomfort.  Initial treatment for COPD exacerbation in the ER with no significant relief.  Chest x-ray showed right basal pneumothorax.  Repeat 2 hours later showed that is stable.  Patient has multiple bullae.  Location appears to be high risk for any chest tube to be started in the ER.  Patient otherwise hemodynamically stable.  Is being admitted for observation and eventual chest tube if pneumothorax expands.  In the meantime he is wheezing and appears to have COPD exacerbation which is being addressed and being treated..  ED Course: Temperature is 98 blood pressure 190/67 pulse 88 respirate 22 oxygen sat 90% on room air.  White count is 9.0 hemoglobin 14.1 and platelets 243 chemistry showed chloride of 94 creatinine 1.58 and glucose 140.  The rest of the numbers appeared to be within normal.  Chest x-ray showed right basal pneumothorax.  Shows a apical mass was markers noted.  The left lobe remains stable.  Patient being admitted with COPD exacerbation and new pneumothorax.  Review of Systems: As per HPI otherwise 10 point review of systems negative.    Past Medical History:  Diagnosis Date   Alcohol abuse, in remission    remote     Arthritis    BACK AND NECK   Benign essential tremor    improved on metoprolol and gabapentin   BPH (benign prostatic hypertrophy) 12/30/2012   Cardiac arrhythmia 03/2010   h/o a flutter and CM, s/p ablation, normal stress test   Cataract    bilat removed    Chemotherapy-induced neutropenia ([place]) 09/23/2015   Chronic low back pain    MRI 2004, multilevel DDD   Colon polyps    next colonoscopy due around 2725   Complication of anesthesia age [age]   DID NOT GET COMPLETELY NUMB WITH TONSILLECTOMY   COPD (chronic obstructive pulmonary disease) ([place])    Coronary artery disease    Dyslipidemia    mild off meds   GERD (gastroesophageal reflux disease)    History of kidney stones    h/o   HTN (hypertension)    Macular degeneration, bilateral    Multiple pulmonary nodules 02/2015   Neuromuscular disorder (HCC)    neuropathy from chemo....in feet   Peripheral neuropathy due to chemotherapy ([place]) 12/23/2015   Saw Dr [place] Graham, on gabapentin and cymbalta with hydrocodone PRN (03/2016)   Personal history of tobacco use, presenting hazards to health 02/18/2015   quit 06/2011, restarted 2014   Pneumothorax after biopsy 04/01/2015   Pre-diabetes    Primary cancer of bladder ([place]) 2016   [place] Graham   Primary lung cancer ([place]) 2016   port a cath removed 03/2016   Shortness of breath dyspnea    OCC WITH EXERTION    Past Surgical History:  Procedure Laterality Date  A FLUTTER ABLATION  03/2010   carotid US  03/2010   WNL   CATARACT EXTRACTION     COLONOSCOPY  09/2011   polyps, rpt due 5 yrs, mild diverticulosis Joseph Graham)   CYSTOSCOPY W/ RETROGRADES Bilateral 04/06/2015   Procedure: CYSTOSCOPY WITH RETROGRADE PYELOGRAM;  Surgeon: [place] Retort, MD;  Location: [facility] ORS;  Service: Urology;  Laterality: Bilateral;   ELECTROMAGNETIC NAVIGATION BROCHOSCOPY N/A 03/02/2015   Procedure: ELECTROMAGNETIC NAVIGATION BRONCHOSCOPY;  Surgeon: [place] Boehringer, MD;  Location: [facility]  ORS;  Service: Cardiopulmonary;  Laterality: N/A;   HEMI-MICRODISCECTOMY LUMBAR LAMINECTOMY LEVEL 1 N/A 01/26/2019   LEFT L5-S1 FAR LATERAL APPROACH RESECTION OF TRANSVERSE PROCESS for Joseph Graham's syndrome Joseph Graham, Joseph Lipps, MD)   [place]     collapsed lung with chest tube   POLYPECTOMY     PORT-A-CATH REMOVAL Right 04/03/2016   Procedure: REMOVAL PORT-A-CATH;  Surgeon: Joseph Lewandowsky, MD;  Location: [facility] ORS;  Service: General;  Laterality: Right;   PORTACATH PLACEMENT N/A 08/10/2015   Procedure: INSERTION PORT-A-CATH;  Surgeon: [place] Lewandowsky, MD;  Location: [facility] ORS;  Service: General;  Laterality: N/A;   TONSILLECTOMY  1964   TRANSURETHRAL RESECTION OF BLADDER TUMOR N/A 04/06/2015   Procedure: TRANSURETHRAL RESECTION OF BLADDER TUMOR (TURBT) right ureteral stent placement;  Surgeon: [place] Retort, MD;  Location: [facility] ORS;  Service: Urology;  Laterality: N/A;   TRANSURETHRAL RESECTION OF BLADDER TUMOR N/A 07/13/2015   Procedure: TRANSURETHRAL RESECTION OF BLADDER TUMOR (TURBT);  Surgeon: [place] Retort, MD;  Location: [facility] ORS;  Service: Urology;  Laterality: N/A;   urinary stent removal  04/14/15     reports that he quit smoking about 5 years ago. His smoking use included cigarettes. He has a 91.50 pack-year smoking history. He has never used smokeless tobacco. He reports that he does not drink alcohol and does not use drugs.  No Known Allergies  Family History  Problem Relation Age of Onset   Cervical cancer Mother 73   Hypertension Father    Tremor Father        and several uncles/aunts (not parkinson's)   Stroke Brother 62   Bladder Cancer Brother 75   Prostate cancer Neg Hx    Kidney cancer Neg Hx    Colon cancer Neg Hx    Colon polyps Neg Hx    Esophageal cancer Neg Hx    Rectal cancer Neg Hx      Prior to Admission medications   Medication Sig Start Date End Date Taking? Authorizing Provider  albuterol (PROAIR HFA) 108  (90 Base) MCG/ACT inhaler Inhale 2 puffs into the lungs every 6 (six) hours as needed for wheezing or shortness of breath. 01/14/20   [place] Bush, MD  ASPIRIN 81 PO Take 1 tablet by mouth daily.    [provider]  Cholecalciferol (VITAMIN D-3) 125 MCG (5000 UT) TABS Take 5,000 Units by mouth daily.    [provider]  fentaNYL (DURAGESIC) 12 MCG/HR Place 1 patch onto the skin every 3 (three) days. 04/14/20   Borders, Kirt Boys, NP  fluticasone (FLONASE) 50 MCG/ACT nasal spray Place 2 sprays into both nostrils daily. 01/14/20   [place] Bush, MD  gabapentin (NEURONTIN) 300 MG capsule TAKE 1 CAPSULE BY MOUTH IN THE MORNING, 1 CAPSULE IN THE AFTERNOON, AND 2 CAPSULES AT NIGHT 10/06/19   [provider]  gabapentin (NEURONTIN) 600 MG tablet Take 600 mg by mouth 2 (two) times daily as needed. Patient has special instructions to  take along with his 300mg  03/15/20 03/15/21  [provider]  HYDROcodone-acetaminophen (NORCO) 5-325 MG tablet Take 1 tablet by mouth every 6 (six) hours as needed for moderate pain. 03/02/20   Borders, Kirt Boys, NP  lisinopril (ZESTRIL) 10 MG tablet TAKE 1 TABLET BY MOUTH EVERY EVENING 10/02/19   [place] Bush, MD  metoprolol tartrate (LOPRESSOR) 25 MG tablet TAKE 1/2 TABLET BY MOUTH TWICE DAILY 10/02/19   [place] Bush, MD  Multiple Vitamin (MULTIVITAMIN WITH MINERALS) TABS tablet Take 1 tablet by mouth every evening.    [provider]  naloxone [facility]) nasal spray 4 mg/0.1 mL 1 spray into nostril upon signs of opioid overdose. Call 911. May repeat once if no response within 2-3 minutes. 04/14/20   Borders, Kirt Boys, NP  NON FORMULARY Take 500 mg by mouth daily. Hemp Oil 500 mg    [provider]  omeprazole (PRILOSEC) 20 MG capsule Take 20 mg by mouth every morning.     [provider]  oxyCODONE-acetaminophen (PERCOCET) 10-325 MG tablet Take 1 pill 1-2 hours prior to imaging/radiation treatments. 03/09/20    Cammie Sickle, MD  OXYGEN Inhale 2 L into the lungs daily.    [provider]  oxymetazoline (AFRIN) 0.05 % nasal spray Place 1 spray into both nostrils 2 (two) times daily.    [provider]  polyethylene glycol (MIRALAX / GLYCOLAX) packet Take 17 g by mouth daily.    [provider]  predniSONE (DELTASONE) 20 MG tablet Take 2 tablets (40 mg total) by mouth daily with breakfast. 01/14/20   [place] Bush, MD  primidone (MYSOLINE) 50 MG tablet Take 150 mg by mouth 2 (two) times a day. 10/13/18 02/26/20  [provider]  rosuvastatin (CRESTOR) 10 MG tablet Take 1 tablet (10 mg total) by mouth daily. 01/14/20   [place] Bush, MD  vitamin B-12 (CYANOCOBALAMIN) 100 MCG tablet Take 100 mcg by mouth every evening.    [provider]  vitamin E 400 UNIT capsule Take 400 Units by mouth every evening.     [provider]    Physical Exam: Vitals:   04/15/20 2056 04/15/20 2059 04/15/20 2100 04/15/20 2300  BP: (!) 166/69  (!) 191/67 (!) 148/51  Pulse: 88  84 85  Resp: (!) 22   (!) 22  Temp: 98 F (36.7 C)     TempSrc: Oral     SpO2: 92%  95% 90%  Weight:  83.9 kg    Height:  5\' 10"  (1.778 m)        Constitutional: Acutely ill looking, mild respiratory distress Vitals:   04/15/20 2056 04/15/20 2059 04/15/20 2100 04/15/20 2300  BP: (!) 166/69  (!) 191/67 (!) 148/51  Pulse: 88  84 85  Resp: (!) 22   (!) 22  Temp: 98 F (36.7 C)     TempSrc: Oral     SpO2: 92%  95% 90%  Weight:  83.9 kg    Height:  5\' 10"  (1.778 m)     Eyes: PERRL, lids and conjunctivae normal ENMT: Mucous membranes are moist. Posterior pharynx clear of any exudate or lesions.Normal dentition.  Neck: normal, supple, no masses, no thyromegaly Respiratory: Decreased air entry bilaterally with marked expiratory wheezing and some rhonchi normal respiratory effort. No accessory muscle use.  Cardiovascular: Sinus tachycardia, no murmurs / rubs / gallops. No  extremity edema. 2+ pedal pulses. No carotid bruits.  Abdomen: no tenderness, no masses palpated. No hepatosplenomegaly. Bowel sounds positive.  Musculoskeletal:  no clubbing / cyanosis. No joint deformity upper and lower extremities. Good ROM, no contractures. Normal muscle tone.  Skin: no rashes, lesions, ulcers. No induration Neurologic: CN 2-12 grossly intact. Sensation intact, DTR normal. Strength 5/5 in all 4.  Psychiatric: Normal judgment and insight. Alert and oriented x 3. Normal mood.     Labs on Admission: I have personally reviewed following labs and imaging studies  CBC: Recent Labs  Lab 04/15/20 2139  WBC 9.0  NEUTROABS 7.5  HGB 14.1  HCT 45.0  MCV 99.8  PLT 106   Basic Metabolic Panel: Recent Labs  Lab 04/15/20 2139  NA 137  K 4.3  CL 94*  CO2 32  GLUCOSE 140*  BUN 22  CREATININE 0.58*  CALCIUM 9.3   GFR: Estimated Creatinine Clearance: 77.3 mL/min (A) (by C-G formula based on SCr of 0.58 mg/dL (L)). Liver Function Tests: Recent Labs  Lab 04/15/20 2139  AST 26  ALT 19  ALKPHOS 144*  BILITOT 0.4  PROT 9.1*  ALBUMIN 4.4   No results for input(s): LIPASE, AMYLASE in the last 168 hours. No results for input(s): AMMONIA in the last 168 hours. Coagulation Profile: No results for input(s): INR, PROTIME in the last 168 hours. Cardiac Enzymes: No results for input(s): CKTOTAL, CKMB, CKMBINDEX, TROPONINI in the last 168 hours. BNP (last 3 results) No results for input(s): PROBNP in the last 8760 hours. HbA1C: No results for input(s): HGBA1C in the last 72 hours. CBG: No results for input(s): GLUCAP in the last 168 hours. Lipid Profile: No results for input(s): CHOL, HDL, LDLCALC, TRIG, CHOLHDL, LDLDIRECT in the last 72 hours. Thyroid Function Tests: No results for input(s): TSH, T4TOTAL, FREET4, T3FREE, THYROIDAB in the last 72 hours. Anemia Panel: No results for input(s): VITAMINB12, FOLATE, FERRITIN, TIBC, IRON, RETICCTPCT in the last 72  hours. Urine analysis:    Component Value Date/Time   COLORURINE YELLOW (A) 01/22/2019 1212   APPEARANCEUR CLEAR (A) 01/22/2019 1212   APPEARANCEUR Clear 03/05/2018 1420   LABSPEC 1.017 01/22/2019 1212   PHURINE 6.0 01/22/2019 1212   GLUCOSEU NEGATIVE 01/22/2019 1212   HGBUR NEGATIVE 01/22/2019 1212   BILIRUBINUR NEGATIVE 01/22/2019 1212   BILIRUBINUR Negative 03/05/2018 1420   KETONESUR NEGATIVE 01/22/2019 1212   PROTEINUR NEGATIVE 01/22/2019 1212   NITRITE NEGATIVE 01/22/2019 1212   LEUKOCYTESUR NEGATIVE 01/22/2019 1212   Sepsis Labs: @LABRCNTIP (procalcitonin:4,lacticidven:4) ) Recent Results (from the past 240 hour(s))  Respiratory Panel by RT PCR (Flu A&B, Covid) - Nasopharyngeal Swab     Status: None   Collection Time: 04/15/20  9:39 PM   Specimen: Nasopharyngeal Swab  Result Value Ref Range Status   SARS Coronavirus 2 by RT PCR NEGATIVE NEGATIVE Final    Comment: (NOTE) SARS-CoV-2 target nucleic acids are NOT DETECTED.  The SARS-CoV-2 RNA is generally detectable in upper respiratoy specimens during the acute phase of infection. The lowest concentration of SARS-CoV-2 viral copies this assay can detect is 131 copies/mL. A negative result does not preclude SARS-Cov-2 infection and should not be used as the sole basis for treatment or other patient management decisions. A negative result may occur with  improper specimen collection/handling, submission of specimen other than nasopharyngeal swab, presence of viral mutation(s) within the areas targeted by this assay, and inadequate number of viral copies (<131 copies/mL). A negative result must be combined with clinical observations, patient history, and epidemiological information. The expected result is Negative.  Fact Sheet for Patients:  PinkCheek.be  Fact Sheet for Healthcare Providers:  GravelBags.it  This test is no t yet approved or cleared by the [place] and  has been authorized for detection and/or diagnosis of SARS-CoV-2 by FDA under an Emergency Use Authorization (EUA). This EUA will remain  in effect (meaning this test can be used) for the duration of the COVID-19 declaration under Section 564(b)(1) of the Act, 21 U.S.C. section 360bbb-3(b)(1), unless the authorization is terminated or revoked sooner.     Influenza A by PCR NEGATIVE NEGATIVE Final   Influenza B by PCR NEGATIVE NEGATIVE Final    Comment: (NOTE) The Xpert Xpress SARS-CoV-2/FLU/RSV assay is intended as an aid in  the diagnosis of influenza from Nasopharyngeal swab specimens and  should not be used as a sole basis for treatment. Nasal washings and  aspirates are unacceptable for Xpert Xpress SARS-CoV-2/FLU/RSV  testing.  Fact Sheet for Patients: PinkCheek.be  Fact Sheet for Healthcare Providers: GravelBags.it  This test is not yet approved or cleared by the [place] FDA and  has been authorized for detection and/or diagnosis of SARS-CoV-2 by  FDA under an Emergency Use Authorization (EUA). This EUA will remain  in effect (meaning this test can be used) for the duration of the  Covid-19 declaration under Section 564(b)(1) of the Act, 21  U.S.C. section 360bbb-3(b)(1), unless the authorization is  terminated or revoked. Performed at [facility], [address] [place], [place] [address]      Radiological Exams on Admission: DG Chest Portable 1 View  Result Date: 04/15/2020 CLINICAL DATA:  Shortness of breath EXAM: PORTABLE CHEST 1 VIEW COMPARISON:  04/15/2020 FINDINGS: Right pneumothorax again noted, unchanged. Heart is normal size. No confluent airspace opacities. No acute bony abnormality. IMPRESSION: Stable right pneumothorax. Electronically Signed   By: Rolm Baptise M.D.   On: 04/15/2020 23:23   DG Chest Portable 1 View  Result Date: 04/15/2020 CLINICAL DATA:  Chest pain  and shortness of breath, history of carcinoma of the lung EXAM: PORTABLE CHEST 1 VIEW COMPARISON:  03/15/2020 FINDINGS: Previously seen chest wall port is been removed in the interval. Apical mass with fiducial markers is noted on the right. The dominant nodule in the posterior aspect of the right lung base is not as well appreciated as on prior PET-CT. New right basilar pneumothorax is seen. This extends along the lateral aspect of the right chest. Left lung remains clear. IMPRESSION: New right-sided pneumothorax. This is prominently in the right base laterally although a component does extend superiorly near the apex. Changes in the right apex with fiducial markers are noted consistent with the given clinical history. The lower lobe nodule is not as well appreciated in part due to the pneumothorax but the overlying hemidiaphragm. Critical Value/emergent results were called by telephone at the time of interpretation on 04/15/2020 at 9:34 pm to Dr. Carrie Mew , who verbally acknowledged these results. Electronically Signed   By: Inez Catalina M.D.   On: 04/15/2020 21:37    EKG: Independently reviewed.  Sinus tachycardia otherwise no significant findings  Assessment/Plan Principal Problem:   Pneumothorax on right Active Problems:   HTN (hypertension)   Benign essential tremor   COPD with hypoxia (HCC)   Malignant neoplasm of right upper lobe of lung (HCC)   Peripheral neuropathy due to chemotherapy (HCC)   HLD (hyperlipidemia)   Chronic pain syndrome     #1 right lung pneumothorax: Location is at the right base.  Is quite risky to put the tube apparently in the ER.  Patient will be observed for possible spontaneous resolution.  Currently on 4 L of oxygen will continue.  Repeat chest x-ray in the morning and if pneumothorax is getting bigger might require IR intervention.  #2 COPD with acute exacerbation: Initiate steroids, antibiotics and nebulizer treatment.  #3 essential hypertension:  Continue home regimen once confirmed.  #4 right-sided lung cancer: Currently on radiation therapy.  Continue  #5 hyperlipidemia: Continue home regimen.  #6 essential tremors: Continue home regimen  #7 peripheral neuropathy: Continue home regimen   DVT prophylaxis: Lovenox Code Status: Full code Family Communication: No family at bedside Disposition Plan: Home Consults called: None Admission status: Inpatient  Severity of Illness: The appropriate patient status for this patient is INPATIENT. Inpatient status is judged to be reasonable and necessary in order to provide the required intensity of service to ensure the patient's safety. The patient's presenting symptoms, physical exam findings, and initial radiographic and laboratory data in the context of their chronic comorbidities is felt to place them at high risk for further clinical deterioration. Furthermore, it is not anticipated that the patient will be medically stable for discharge from the hospital within 2 midnights of admission. The following factors support the patient status of inpatient.   " The patient's presenting symptoms include shortness of breath. " The worrisome physical exam findings include coarse breath sounds. " The initial radiographic and laboratory data are worrisome because of pneumothorax on chest x-ray. " The chronic co-morbidities include lung cancer.   * I certify that at the point of admission it is my clinical judgment that the patient will require inpatient hospital care spanning beyond 2 midnights from the point of admission due to high intensity of service, high risk for further deterioration and high frequency of surveillance required.Barbette Merino MD [facility] Pager [phone]  If 7PM-7AM, please contact night-coverage [email or web address] Password [email or web address]  04/15/2020, 11:50 PM

## 2020-04-15 NOTE — ED Provider Notes (Signed)
[facility] Emergency Department Provider Note  ____________________________________________  Time seen: Approximately 11:34 PM  I have reviewed the triage vital signs and the nursing notes.   HISTORY  Chief Complaint Chest Pain    HPI Joseph Graham is a 79 y.o. male with a history of COPD, GERD hypertension lung cancer on 2 L nasal cannula chronically who complains of chest pain and shortness of breath that began tonight, worse with exertion, not pleuritic.  Moderate intensity, radiates to the back, worse on the right side.  Regarding lung cancer he stopped doing chemotherapy 5 years ago, he has associated polyneuropathy.  He is still doing radiation therapy and had his most recent treatment 1 week ago.      Past Medical History:  Diagnosis Date  . Alcohol abuse, in remission    remote   . Arthritis    BACK AND NECK  . Benign essential tremor    improved on metoprolol and gabapentin  . BPH (benign prostatic hypertrophy) 12/30/2012  . Cardiac arrhythmia 03/2010   h/o a flutter and CM, s/p ablation, normal stress test  . Cataract    bilat removed   . Chemotherapy-induced neutropenia ([place]) 09/23/2015  . Chronic low back pain    MRI 2004, multilevel DDD  . Colon polyps    next colonoscopy due around 2018  . Complication of anesthesia age [age]   DID NOT GET COMPLETELY NUMB WITH TONSILLECTOMY  . COPD (chronic obstructive pulmonary disease) ([place])   . Coronary artery disease   . Dyslipidemia    mild off meds  . GERD (gastroesophageal reflux disease)   . History of kidney stones    h/o  . HTN (hypertension)   . Macular degeneration, bilateral   . Multiple pulmonary nodules 02/2015  . Neuromuscular disorder ([place])    neuropathy from chemo....in feet  . Peripheral neuropathy due to chemotherapy ([place]) 12/23/2015   Saw Dr Manuella Ghazi, on gabapentin and cymbalta with hydrocodone PRN (03/2016)  . Personal history of tobacco use, presenting hazards to health  02/18/2015   quit 06/2011, restarted 2014  . Pneumothorax after biopsy 04/01/2015  . Pre-diabetes   . Primary cancer of bladder ([place]) 2016   Budzyn  . Primary lung cancer ([place]) 2016   port a cath removed 03/2016  . Shortness of breath dyspnea    OCC WITH EXERTION     Patient Active Problem List   Diagnosis Date Noted  . Grade 1 Retrolisthesis of L2/L3 11/25/2019  . Abnormal CT scan, lumbar spine (01/22/2019) 11/25/2019  . Osteoarthritis involving multiple joints 08/18/2019  . Rhinitis medicamentosa 07/14/2019  . Right bundle branch block (RBBB) with left anterior hemiblock 01/21/2019  . Other specified spondylopathies, sacral and sacrococcygeal region ([place]) 01/15/2019  . Spondylosis without myelopathy or radiculopathy, lumbosacral region 08/28/2018  . Abnormal MRI, lumbar spine (06/12/2018) 08/20/2018  . DDD (degenerative disc disease), lumbosacral 08/07/2018  . Coccygodynia 08/07/2018  . Lumbar facet hypertrophy (Multilevel) (Bilateral) 08/04/2018  . Osteoarthritis of facet joint of lumbar spine 08/04/2018  . Lumbar spondylosis 08/04/2018  . Lumbar facet syndrome (Bilateral) (L>R) 08/04/2018  . Lumbar lateral recess stenosis (L2-3, L3-4, L4-5) (Right) 08/04/2018  . Lumbar foraminal stenosis (Right: L2-3, L3-4) (Bilateral: L4-5) 08/04/2018  . Chronic low back pain (Primary area of Pain) (Bilateral)  (L>R) w/o sciatica 07/14/2018  . Chronic coccyx pain (Secondary Area of Pain) (Midline) 07/14/2018  . Chronic lower extremity pain [facility] Area of Pain) (Bilateral) (R>L) 07/14/2018  . Chronic pain syndrome 07/14/2018  .  Long term current use of opiate analgesic 07/14/2018  . Pharmacologic therapy 07/14/2018  . Disorder of skeletal system 07/14/2018  . Problems influencing health status 07/14/2018  . HLD (hyperlipidemia) 07/10/2018  . DDD (degenerative disc disease), lumbar 07/10/2018  . Macular degeneration of both eyes 10/01/2017  . Class 1 obesity due to excess calories with  serious comorbidity and body mass index (BMI) of 31.0 to 31.9 in adult 07/30/2016  . Mixed sensory-motor polyneuropathy 07/30/2016  . Vitamin B12 deficiency 07/30/2016  . Peripheral neuropathy due to chemotherapy ([place]) 12/23/2015  . Malignant neoplasm of right upper lobe of lung ([place]) 12/20/2015  . Bilateral lower extremity edema 12/09/2015  . COPD with hypoxia ([place])   . Primary cancer of bladder ([place])   . PAD (peripheral artery disease) ([place]) 03/01/2015  . Coronary artery disease involving native coronary artery of native heart without angina pectoris 03/01/2015  . Health maintenance examination 01/05/2014  . Advanced care planning/counseling discussion 01/05/2014  . Prediabetes 12/30/2012  . Benign prostatic hyperplasia 12/30/2012  . Special screening for malignant neoplasms, colon 07/31/2011  . Medicare annual wellness visit, subsequent 07/31/2011  . Benign essential tremor   . Personal history of colonic polyps - adenomas   . HTN (hypertension)   . Ex-smoker   . Cardiac arrhythmia 03/11/2010     Past Surgical History:  Procedure Laterality Date  . A FLUTTER ABLATION  03/2010  . carotid US  03/2010   WNL  . CATARACT EXTRACTION    . COLONOSCOPY  09/2011   polyps, rpt due 5 yrs, mild diverticulosis [name])  . CYSTOSCOPY W/ RETROGRADES Bilateral 04/06/2015   Procedure: CYSTOSCOPY WITH RETROGRADE PYELOGRAM;  Surgeon: [name], MD;  Location: [facility] ORS;  Service: Urology;  Laterality: Bilateral;  . ELECTROMAGNETIC NAVIGATION BROCHOSCOPY N/A 03/02/2015   Procedure: ELECTROMAGNETIC NAVIGATION BRONCHOSCOPY;  Surgeon: [name], MD;  Location: [facility] ORS;  Service: Cardiopulmonary;  Laterality: N/A;  . 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 HEMORROIDECTOMY  1982  . LUNG BIOPSY     collapsed lung with chest tube  . POLYPECTOMY    .  PORT-A-CATH REMOVAL Right 04/03/2016   Procedure: REMOVAL PORT-A-CATH;  Surgeon: [name], MD;  Location: [facility] ORS;  Service: General;  Laterality: Right;  . PORTACATH PLACEMENT N/A 08/10/2015   Procedure: INSERTION PORT-A-CATH;  Surgeon: [name], MD;  Location: [facility] ORS;  Service: General;  Laterality: N/A;  . [place]  . TRANSURETHRAL RESECTION OF BLADDER TUMOR N/A 04/06/2015   Procedure: TRANSURETHRAL RESECTION OF BLADDER TUMOR (TURBT) right ureteral stent placement;  Surgeon: [name], MD;  Location: [facility] ORS;  Service: Urology;  Laterality: N/A;  . TRANSURETHRAL RESECTION OF BLADDER TUMOR N/A 07/13/2015   Procedure: TRANSURETHRAL RESECTION OF BLADDER TUMOR (TURBT);  Surgeon: [name], MD;  Location: [facility] ORS;  Service: Urology;  Laterality: N/A;  . urinary stent removal  04/14/15     Prior to Admission medications   Medication Sig Start Date End Date Taking? Authorizing Provider  albuterol (PROAIR HFA) 108 (90 Base) MCG/ACT inhaler Inhale 2 puffs into the lungs every 6 (six) hours as needed for wheezing or shortness of breath. 01/14/20   [name], MD  ASPIRIN 81 PO Take 1 tablet by mouth daily.    [provider]  Cholecalciferol (VITAMIN D-3) 125 MCG (5000 UT) TABS Take 5,000 Units by mouth daily.  [provider]  fentaNYL (DURAGESIC) 12 MCG/HR Place 1 patch onto the skin every 3 (three) days. 04/14/20   Borders, Kirt Boys, NP  fluticasone (FLONASE) 50 MCG/ACT nasal spray Place 2 sprays into both nostrils daily. 01/14/20   [name], MD  gabapentin (NEURONTIN) 300 MG capsule TAKE 1 CAPSULE BY MOUTH IN THE MORNING, 1 CAPSULE IN THE AFTERNOON, AND 2 CAPSULES AT NIGHT 10/06/19   [provider]  gabapentin (NEURONTIN) 600 MG tablet Take 600 mg by mouth 2 (two) times daily as needed. Patient has special instructions to take along with his 300mg  03/15/20 03/15/21  [provider]  HYDROcodone-acetaminophen  (NORCO) 5-325 MG tablet Take 1 tablet by mouth every 6 (six) hours as needed for moderate pain. 03/02/20   Borders, Kirt Boys, NP  lisinopril (ZESTRIL) 10 MG tablet TAKE 1 TABLET BY MOUTH EVERY EVENING 10/02/19   [name], MD  metoprolol tartrate (LOPRESSOR) 25 MG tablet TAKE 1/2 TABLET BY MOUTH TWICE DAILY 10/02/19   [name], MD  Multiple Vitamin (MULTIVITAMIN WITH MINERALS) TABS tablet Take 1 tablet by mouth every evening.    [provider]  naloxone [facility]) nasal spray 4 mg/0.1 mL 1 spray into nostril upon signs of opioid overdose. Call 911. May repeat once if no response within 2-3 minutes. 04/14/20   Borders, Kirt Boys, NP  NON FORMULARY Take 500 mg by mouth daily. Hemp Oil 500 mg    [provider]  omeprazole (PRILOSEC) 20 MG capsule Take 20 mg by mouth every morning.     [provider]  oxyCODONE-acetaminophen (PERCOCET) 10-325 MG tablet Take 1 pill 1-2 hours prior to imaging/radiation treatments. 03/09/20   Cammie Sickle, MD  OXYGEN Inhale 2 L into the lungs daily.    [provider]  oxymetazoline (AFRIN) 0.05 % nasal spray Place 1 spray into both nostrils 2 (two) times daily.    [provider]  polyethylene glycol (MIRALAX / GLYCOLAX) packet Take 17 g by mouth daily.    [provider]  predniSONE (DELTASONE) 20 MG tablet Take 2 tablets (40 mg total) by mouth daily with breakfast. 01/14/20   [name], MD  primidone (MYSOLINE) 50 MG tablet Take 150 mg by mouth 2 (two) times a day. 10/13/18 02/26/20  [provider]  rosuvastatin (CRESTOR) 10 MG tablet Take 1 tablet (10 mg total) by mouth daily. 01/14/20   [name], MD  vitamin B-12 (CYANOCOBALAMIN) 100 MCG tablet Take 100 mcg by mouth every evening.    [provider]  vitamin E 400 UNIT capsule Take 400 Units by mouth every evening.     [provider]     Allergies Patient has no known allergies.   Family History   Problem Relation Age of Onset  . Cervical cancer Mother 66  . Hypertension Father   . Tremor Father        and several uncles/aunts (not parkinson's)  . Stroke Brother 42  . Bladder Cancer Brother 71  . Prostate cancer Neg Hx   . Kidney cancer Neg Hx   . Colon cancer Neg Hx   . Colon polyps Neg Hx   . Esophageal cancer Neg Hx   . Rectal cancer Neg Hx     Social History Social History   Tobacco Use  . Smoking status: Former Smoker    Packs/day: 1.50    Years: 61.00    Pack years: 91.50    Types: Cigarettes    Quit date: 03/08/2015  Years since quitting: 5.1  . Smokeless tobacco: Never Used  . Tobacco comment: cut back 0.5 cigarettes--stopped 03/07/15; now now chantix  Vaping Use  . Vaping Use: Every day  . Substances: Nicotine  Substance Use Topics  . Alcohol use: No    Alcohol/week: 0.0 standard drinks    Comment: has not drank in 37 years  . Drug use: No    Review of Systems  Constitutional:   No fever or chills.  ENT:   No sore throat. No rhinorrhea. Cardiovascular: Positive chest pain as above without syncope. Respiratory: Positive shortness of breath without cough. Gastrointestinal:   Negative for abdominal pain, vomiting and diarrhea.  Musculoskeletal:   Negative for focal pain or swelling All other systems reviewed and are negative except as documented above in ROS and HPI.  ____________________________________________   PHYSICAL EXAM:  VITAL SIGNS: ED Triage Vitals  Enc Vitals Group     BP 04/15/20 2056 (!) 166/69     Pulse Rate 04/15/20 2056 88     Resp 04/15/20 2056 (!) 22     Temp 04/15/20 2056 98 F (36.7 C)     Temp Source 04/15/20 2056 Oral     SpO2 04/15/20 2056 92 %     Weight 04/15/20 2059 185 lb (83.9 kg)     Height 04/15/20 2059 5\' 10"  (1.778 m)     Head Circumference --      Peak Flow --      Pain Score 04/15/20 2057 8     Pain Loc --      Pain Edu? --      Excl. in [place]? --     Vital signs reviewed, nursing assessments  reviewed.   Constitutional:   Alert and oriented. Non-toxic appearance. Eyes:   Conjunctivae are normal. EOMI. PERRL. ENT      Head:   Normocephalic and atraumatic.      Nose:   Wearing a mask.      Mouth/Throat:   Wearing a mask.      Neck:   No meningismus. Full ROM. Hematological/Lymphatic/Immunilogical:   No cervical lymphadenopathy. Cardiovascular:   RRR. Symmetric bilateral radial and DP pulses.  No murmurs. Cap refill less than 2 seconds. Respiratory: Symmetric air movement, no crackles.  Faint expiratory wheezing and prolonged expiratory phase. Gastrointestinal:   Soft and nontender. Non distended. There is no CVA tenderness.  No rebound, rigidity, or guarding.  Musculoskeletal:   Normal range of motion in all extremities. No joint effusions.  No lower extremity tenderness.  No edema.  Symmetric calf circumference.  Negative Homans' sign. Neurologic:   Normal speech and language.  Motor grossly intact. No acute focal neurologic deficits are appreciated.  Skin:    Skin is warm, dry and intact. No rash noted.  No petechiae, purpura, or bullae.  ____________________________________________    LABS (pertinent positives/negatives) (all labs ordered are listed, but only abnormal results are displayed) Labs Reviewed  COMPREHENSIVE METABOLIC PANEL - Abnormal; Notable for the following components:      Result Value   Chloride 94 (*)    Glucose, Bld 140 (*)    Creatinine, Ser 0.58 (*)    Total Protein 9.1 (*)    Alkaline Phosphatase 144 (*)    All other components within normal limits  TROPONIN I (HIGH SENSITIVITY) - Abnormal; Notable for the following components:   Troponin I (High Sensitivity) 45 (*)    All other components within normal limits  RESPIRATORY PANEL BY RT PCR (FLU  A&B, COVID)  CBC WITH DIFFERENTIAL/PLATELET  TROPONIN I (HIGH SENSITIVITY)   ____________________________________________   EKG  Interpreted by me Normal sinus rhythm rate of 84, left axis,  right bundle branch block with associated repolarization abnormality consisting of T wave inversions in V2 and V3.  Normal ST segments, no acute ischemic changes.  Unchanged from prior EKG April 03, 2016.  ____________________________________________    RADIOLOGY  DG Chest Portable 1 View  Result Date: 04/15/2020 CLINICAL DATA:  Shortness of breath EXAM: PORTABLE CHEST 1 VIEW COMPARISON:  04/15/2020 FINDINGS: Right pneumothorax again noted, unchanged. Heart is normal size. No confluent airspace opacities. No acute bony abnormality. IMPRESSION: Stable right pneumothorax. Electronically Signed   By: Rolm Baptise M.D.   On: 04/15/2020 23:23   DG Chest Portable 1 View  Result Date: 04/15/2020 CLINICAL DATA:  Chest pain and shortness of breath, history of carcinoma of the lung EXAM: PORTABLE CHEST 1 VIEW COMPARISON:  03/15/2020 FINDINGS: Previously seen chest wall port is been removed in the interval. Apical mass with fiducial markers is noted on the right. The dominant nodule in the posterior aspect of the right lung base is not as well appreciated as on prior PET-CT. New right basilar pneumothorax is seen. This extends along the lateral aspect of the right chest. Left lung remains clear. IMPRESSION: New right-sided pneumothorax. This is prominently in the right base laterally although a component does extend superiorly near the apex. Changes in the right apex with fiducial markers are noted consistent with the given clinical history. The lower lobe nodule is not as well appreciated in part due to the pneumothorax but the overlying hemidiaphragm. Critical Value/emergent results were called by telephone at the time of interpretation on 04/15/2020 at 9:34 pm to Dr. Carrie Mew , who verbally acknowledged these results. Electronically Signed   By: Inez Catalina M.D.   On: 04/15/2020 21:37     ____________________________________________   PROCEDURES Procedures  ____________________________________________  DIFFERENTIAL DIAGNOSIS   Pneumonia, COPD exacerbation, pneumothorax, PE, pleural effusion, pulmonary edema, non-STEMI  CLINICAL IMPRESSION / ASSESSMENT AND PLAN / ED COURSE  Medications ordered in the ED: Medications  methylPREDNISolone sodium succinate (SOLU-MEDROL) 125 mg/2 mL injection 125 mg (125 mg Intravenous Given 04/15/20 2159)  ipratropium-albuterol (DUONEB) 0.5-2.5 (3) MG/3ML nebulizer solution 3 mL (3 mLs Nebulization Given 04/15/20 2201)  albuterol (PROVENTIL) (2.5 MG/3ML) 0.083% nebulizer solution 5 mg (5 mg Nebulization Given 04/15/20 2201)    Pertinent labs & imaging results that were available during my care of the patient were reviewed by me and considered in my medical decision making (see chart for details).  Joseph Graham was evaluated in Emergency Department on 04/15/2020 for the symptoms described in the history of present illness. He was evaluated in the context of the global COVID-19 pandemic, which necessitated consideration that the patient might be at risk for infection with the SARS-CoV-2 virus that causes COVID-19. Institutional protocols and algorithms that pertain to the evaluation of patients at risk for COVID-19 are in a state of rapid change based on information released by regulatory bodies including the CDC and federal and state organizations. These policies and algorithms were followed during the patient's care in the ED.   Patient presents with acute on chronic respiratory failure with chest pain or shortness of breath, now requiring 4 L of nasal cannula oxygen to maintain normoxia.  Patient is not septic on initial assessment.  Chest x-ray image reviewed by me which does show a right lower lung pneumothorax as  well as bullous disease in the lung.  Received a call from radiology confirming these findings as well.  The pneumothorax is  relatively small, patient is not in respiratory distress, and chest tube insertion feels somewhat risky, so I will plan to have the patient admitted for continued oxygen therapy and monitoring to see if it improves spontaneously versus having IR chest tube placement tomorrow.  ----------------------------------------- 11:38 PM on 04/15/2020 -----------------------------------------  After Solu-Medrol and nebs, oxygen saturation decreased somewhat from 93% on 4 L nasal cannula to 90% on 4 L.  Chest x-ray repeated and is stable, no worsening of the pneumothorax, likely transient VQ mismatch related to the bronchodilators.  Covid is negative.      ____________________________________________   FINAL CLINICAL IMPRESSION(S) / ED DIAGNOSES    Final diagnoses:  Chronic obstructive pulmonary disease, unspecified COPD type ([place])  Acute on chronic respiratory failure with hypoxia ([place])  Malignant neoplasm of lung, unspecified laterality, unspecified part of lung ([place])  Pneumothorax, unspecified type     ED Discharge Orders    None      Portions of this note were generated with dragon dictation software. Dictation errors may occur despite best attempts at proofreading.   Carrie Mew, MD 04/15/20 2340

## 2020-04-15 NOTE — Telephone Encounter (Signed)
Closing encounter

## 2020-04-15 NOTE — ED Triage Notes (Signed)
Pt via EMS, EMS reports chest pain with SOB worsening with exertion. Hx lung CA.

## 2020-04-16 ENCOUNTER — Inpatient Hospital Stay: Payer: Medicare Other

## 2020-04-16 DIAGNOSIS — R0902 Hypoxemia: Secondary | ICD-10-CM

## 2020-04-16 DIAGNOSIS — G250 Essential tremor: Secondary | ICD-10-CM

## 2020-04-16 DIAGNOSIS — G894 Chronic pain syndrome: Secondary | ICD-10-CM

## 2020-04-16 DIAGNOSIS — J449 Chronic obstructive pulmonary disease, unspecified: Secondary | ICD-10-CM

## 2020-04-16 DIAGNOSIS — J9312 Secondary spontaneous pneumothorax: Secondary | ICD-10-CM | POA: Diagnosis not present

## 2020-04-16 LAB — COMPREHENSIVE METABOLIC PANEL
ALT: 16 U/L (ref 0–44)
AST: 33 U/L (ref 15–41)
Albumin: 3.9 g/dL (ref 3.5–5.0)
Alkaline Phosphatase: 124 U/L (ref 38–126)
Anion gap: 14 (ref 5–15)
BUN: 23 mg/dL (ref 8–23)
CO2: 28 mmol/L (ref 22–32)
Calcium: 9.3 mg/dL (ref 8.9–10.3)
Chloride: 96 mmol/L — ABNORMAL LOW (ref 98–111)
Creatinine, Ser: 0.76 mg/dL (ref 0.61–1.24)
GFR, Estimated: >60 mL/min (ref >60)
Glucose, Bld: 169 mg/dL — ABNORMAL HIGH (ref 70–99)
Potassium: 4.4 mmol/L (ref 3.5–5.1)
Sodium: 138 mmol/L (ref 135–145)
Total Bilirubin: 0.5 mg/dL (ref 0.3–1.2)
Total Protein: 8.2 g/dL — ABNORMAL HIGH (ref 6.5–8.1)

## 2020-04-16 LAB — HIV ANTIBODY (ROUTINE TESTING W REFLEX): HIV Screen 4th Generation wRfx: NONREACTIVE

## 2020-04-16 LAB — CBC
HCT: 39.4 % (ref 39.0–52.0)
Hemoglobin: 12.6 g/dL — ABNORMAL LOW (ref 13.0–17.0)
MCH: 31.4 pg (ref 26.0–34.0)
MCHC: 32 g/dL (ref 30.0–36.0)
MCV: 98.3 fL (ref 80.0–100.0)
Platelets: 232 10*3/uL (ref 150–400)
RBC: 4.01 MIL/uL — ABNORMAL LOW (ref 4.22–5.81)
RDW: 13.2 % (ref 11.5–15.5)
WBC: 5.9 10*3/uL (ref 4.0–10.5)
nRBC: 0 % (ref 0.0–0.2)

## 2020-04-16 LAB — PROTIME-INR
INR: 1 (ref 0.8–1.2)
Prothrombin Time: 13.2 seconds (ref 11.4–15.2)

## 2020-04-16 LAB — APTT: aPTT: 30 seconds (ref 24–36)

## 2020-04-16 MED ORDER — GABAPENTIN 600 MG PO TABS: 600.0000 mg | ORAL_TABLET | Freq: Three times a day (TID) | ORAL | Status: DC

## 2020-04-16 MED ORDER — METOPROLOL TARTRATE 25 MG PO TABS
12.5000 mg | ORAL_TABLET | Freq: Two times a day (BID) | ORAL | Status: DC
  Administered 2020-04-16 – 2020-04-18 (×5): 12.5 mg via ORAL
  Filled 2020-04-16 (×5): qty 1

## 2020-04-16 MED ORDER — ALBUTEROL SULFATE (2.5 MG/3ML) 0.083% IN NEBU: 2.5000 mg | INHALATION_SOLUTION | RESPIRATORY_TRACT | Status: DC | PRN

## 2020-04-16 MED ORDER — FENTANYL CITRATE (PF) 100 MCG/2ML IJ SOLN
INTRAMUSCULAR | Status: AC
  Filled 2020-04-16: qty 2

## 2020-04-16 MED ORDER — METHYLPREDNISOLONE SODIUM SUCC 125 MG IJ SOLR
60.0000 mg | Freq: Four times a day (QID) | INTRAMUSCULAR | Status: AC
  Administered 2020-04-16 (×3): 60 mg via INTRAVENOUS
  Filled 2020-04-16 (×4): qty 2

## 2020-04-16 MED ORDER — HYDROCODONE-ACETAMINOPHEN 5-325 MG PO TABS
1.0000 | ORAL_TABLET | Freq: Four times a day (QID) | ORAL | Status: DC | PRN
  Administered 2020-04-18 (×2): 1 via ORAL
  Filled 2020-04-16 (×2): qty 1

## 2020-04-16 MED ORDER — DM-GUAIFENESIN ER 30-600 MG PO TB12
1.0000 | ORAL_TABLET | Freq: Two times a day (BID) | ORAL | Status: DC
  Administered 2020-04-16 – 2020-04-18 (×3): 1 via ORAL
  Filled 2020-04-16 (×4): qty 1

## 2020-04-16 MED ORDER — PRIMIDONE 50 MG PO TABS
150.0000 mg | ORAL_TABLET | Freq: Two times a day (BID) | ORAL | Status: DC
  Administered 2020-04-16 – 2020-04-18 (×5): 150 mg via ORAL
  Filled 2020-04-16 (×8): qty 3

## 2020-04-16 MED ORDER — ASPIRIN 81 MG PO CHEW
81.0000 mg | CHEWABLE_TABLET | Freq: Every day | ORAL | Status: DC
  Administered 2020-04-16 – 2020-04-18 (×3): 81 mg via ORAL
  Filled 2020-04-16 (×3): qty 1

## 2020-04-16 MED ORDER — SODIUM CHLORIDE 0.9 % IV SOLN
1.0000 g | INTRAVENOUS | Status: DC
  Administered 2020-04-16: 1 g via INTRAVENOUS
  Filled 2020-04-16: qty 10

## 2020-04-16 MED ORDER — VITAMIN B-12 100 MCG PO TABS
100.0000 ug | ORAL_TABLET | Freq: Every evening | ORAL | Status: DC
  Administered 2020-04-16: 100 ug via ORAL
  Filled 2020-04-16 (×3): qty 1

## 2020-04-16 MED ORDER — POLYETHYLENE GLYCOL 3350 17 G PO PACK
17.0000 g | PACK | Freq: Every day | ORAL | Status: DC
  Filled 2020-04-16 (×2): qty 1

## 2020-04-16 MED ORDER — GABAPENTIN 600 MG PO TABS
900.0000 mg | ORAL_TABLET | Freq: Three times a day (TID) | ORAL | Status: DC
  Administered 2020-04-16 – 2020-04-18 (×7): 900 mg via ORAL
  Filled 2020-04-16 (×7): qty 2

## 2020-04-16 MED ORDER — LISINOPRIL 10 MG PO TABS
10.0000 mg | ORAL_TABLET | Freq: Every evening | ORAL | Status: DC
  Administered 2020-04-16 – 2020-04-17 (×2): 10 mg via ORAL
  Filled 2020-04-16 (×2): qty 1

## 2020-04-16 MED ORDER — MIDAZOLAM HCL 5 MG/5ML IJ SOLN
INTRAMUSCULAR | Status: AC
  Filled 2020-04-16: qty 5

## 2020-04-16 MED ORDER — FENTANYL CITRATE (PF) 100 MCG/2ML IJ SOLN
INTRAMUSCULAR | Status: AC | PRN
Start: 2020-04-16 — End: 2020-04-16
  Administered 2020-04-16 (×2): 25 ug via INTRAVENOUS

## 2020-04-16 MED ORDER — PREDNISONE 20 MG PO TABS
40.0000 mg | ORAL_TABLET | Freq: Every day | ORAL | Status: DC
  Administered 2020-04-17 – 2020-04-18 (×2): 40 mg via ORAL
  Filled 2020-04-16 (×2): qty 2

## 2020-04-16 MED ORDER — MIDAZOLAM HCL 5 MG/5ML IJ SOLN
INTRAMUSCULAR | Status: AC | PRN
  Administered 2020-04-16 (×2): 1 mg via INTRAVENOUS

## 2020-04-16 MED ORDER — ROSUVASTATIN CALCIUM 10 MG PO TABS
10.0000 mg | ORAL_TABLET | Freq: Every day | ORAL | Status: DC
  Administered 2020-04-16 – 2020-04-17 (×3): 10 mg via ORAL
  Filled 2020-04-16 (×4): qty 1

## 2020-04-16 MED ORDER — PANTOPRAZOLE SODIUM 40 MG PO TBEC
40.0000 mg | DELAYED_RELEASE_TABLET | Freq: Every day | ORAL | Status: DC
  Administered 2020-04-16 – 2020-04-18 (×3): 40 mg via ORAL
  Filled 2020-04-16 (×3): qty 1

## 2020-04-16 MED ORDER — IPRATROPIUM-ALBUTEROL 0.5-2.5 (3) MG/3ML IN SOLN
3.0000 mL | Freq: Four times a day (QID) | RESPIRATORY_TRACT | Status: DC
  Administered 2020-04-16 – 2020-04-17 (×5): 3 mL via RESPIRATORY_TRACT
  Filled 2020-04-16 (×6): qty 3

## 2020-04-16 NOTE — ED Notes (Signed)
Pt transferred to recliner at this time with this RN's assistance per pt request. Pt comfortable in chair, no further needs noted and chest tube site intact. No complications on transfer, pt denies pain at this time. Will continue to monitor.

## 2020-04-16 NOTE — Consult Note (Signed)
Patient ID: Joseph Graham, male   DOB: 1940/07/17, 79 y.o.   MRN: [id number]  HPI Joseph Graham is a 79 y.o. male seen in consultation at the request of Dr. Arbutus Ped comes in with chest pain.  He does have significant history of COPD and is currently on 2 L nasal cannula at home he also reports chest pain that began last night.  Pain is mainly located to the right side it is sharp worsening with deep breathing.  He does have diagnosis of right upper lobe lung cancer and is undergoing radiation treatments.  Last time he had one was last week. I have ordered a CT scan which I have personally reviewed showing evidence of a large pneumothorax on the right side with a hydrothorax component.  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  His CMP is completely normal other than increasing glucose of 169, CBC is completely normal as well as INR and PTT.  He had a poor placement by Dr. Genevive Bi more than 4 years ago.  What staples along  HPI  Past Medical History:  Diagnosis Date  . Alcohol abuse, in remission    remote   . Arthritis    BACK AND NECK  . Benign essential tremor    improved on metoprolol and gabapentin  . BPH (benign prostatic hypertrophy) 12/30/2012  . Cardiac arrhythmia 03/2010   h/o a flutter and CM, s/p ablation, normal stress test  . Cataract    bilat removed   . Chemotherapy-induced neutropenia ([place]) 09/23/2015  . Chronic low back pain    MRI 2004, multilevel DDD  . Colon polyps    next colonoscopy due around 2018  . Complication of anesthesia age 65-23   DID NOT GET COMPLETELY NUMB WITH TONSILLECTOMY  . COPD (chronic obstructive pulmonary disease) ([place])   . Coronary artery disease   . Dyslipidemia    mild off meds  . GERD (gastroesophageal reflux disease)   . History of kidney stones    h/o  . HTN (hypertension)   . Macular degeneration, bilateral   . Multiple pulmonary nodules 02/2015  . Neuromuscular disorder ([place])    neuropathy from chemo....in feet  . Peripheral neuropathy  due to chemotherapy ([place]) 12/23/2015   Saw Dr Manuella Ghazi, on gabapentin and cymbalta with hydrocodone PRN (03/2016)  . Personal history of tobacco use, presenting hazards to health 02/18/2015   quit 06/2011, restarted 2014  . Pneumothorax after biopsy 04/01/2015  . Pre-diabetes   . Primary cancer of bladder ([place]) 2016   Budzyn  . Primary lung cancer ([place]) 2016   port a cath removed 03/2016  . Shortness of breath dyspnea    OCC WITH EXERTION    Past Surgical History:  Procedure Laterality Date  . A FLUTTER ABLATION  03/2010  . carotid US  03/2010   WNL  . CATARACT EXTRACTION    . COLONOSCOPY  09/2011   polyps, rpt due 5 yrs, mild diverticulosis [name])  . CYSTOSCOPY W/ RETROGRADES Bilateral 04/06/2015   Procedure: CYSTOSCOPY WITH RETROGRADE PYELOGRAM;  Surgeon: [name], MD;  Location: [facility];  Service: Urology;  Laterality: Bilateral;  . ELECTROMAGNETIC NAVIGATION BROCHOSCOPY N/A 03/02/2015   Procedure: ELECTROMAGNETIC NAVIGATION BRONCHOSCOPY;  Surgeon: [name], MD;  Location: [facility];  Service: Cardiopulmonary;  Laterality: N/A;  . 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 HEMORROIDECTOMY  1982  . LUNG BIOPSY  collapsed lung with chest tube  . POLYPECTOMY    . PORT-A-CATH REMOVAL Right 04/03/2016   Procedure: REMOVAL PORT-A-CATH;  Surgeon: [name], MD;  Location: [facility];  Service: General;  Laterality: Right;  . PORTACATH PLACEMENT N/A 08/10/2015   Procedure: INSERTION PORT-A-CATH;  Surgeon: [name], MD;  Location: [facility];  Service: General;  Laterality: N/A;  . [place]  . TRANSURETHRAL RESECTION OF BLADDER TUMOR N/A 04/06/2015   Procedure: TRANSURETHRAL RESECTION OF BLADDER TUMOR (TURBT) right ureteral stent placement;  Surgeon: [name], MD;  Location: [facility];  Service: Urology;  Laterality: N/A;   . TRANSURETHRAL RESECTION OF BLADDER TUMOR N/A 07/13/2015   Procedure: TRANSURETHRAL RESECTION OF BLADDER TUMOR (TURBT);  Surgeon: [name], MD;  Location: [facility];  Service: Urology;  Laterality: N/A;  . urinary stent removal  04/14/15    Family History  Problem Relation Age of Onset  . Cervical cancer Mother 11  . Hypertension Father   . Tremor Father        and several uncles/aunts (not parkinson's)  . Stroke Brother 61  . Bladder Cancer Brother 53  . Prostate cancer Neg Hx   . Kidney cancer Neg Hx   . Colon cancer Neg Hx   . Colon polyps Neg Hx   . Esophageal cancer Neg Hx   . Rectal cancer Neg Hx     Social History Social History   Tobacco Use  . Smoking status: Former Smoker    Packs/day: 1.50    Years: 61.00    Pack years: 91.50    Types: Cigarettes    Quit date: 03/08/2015    Years since quitting: 5.1  . Smokeless tobacco: Never Used  . Tobacco comment: cut back 0.5 cigarettes--stopped 03/07/15; now now chantix  Vaping Use  . Vaping Use: Every day  . Substances: Nicotine  Substance Use Topics  . Alcohol use: No    Alcohol/week: 0.0 standard drinks    Comment: has not drank in 37 years  . Drug use: No    No Known Allergies  Current Facility-Administered Medications  Medication Dose Route Frequency Provider Last Rate Last Admin  . albuterol (PROVENTIL) (2.5 MG/3ML) 0.083% nebulizer solution 2.5 mg  2.5 mg Nebulization Q2H PRN Elwyn Reach, MD      . aspirin chewable tablet 81 mg  81 mg Oral Daily Nicole Kindred A, DO   81 mg at 04/16/20 0957  . gabapentin (NEURONTIN) tablet 900 mg  900 mg Oral TID Nicole Kindred A, DO   900 mg at 04/16/20 0957  . HYDROcodone-acetaminophen (NORCO/VICODIN) 5-325 MG per tablet 1 tablet  1 tablet Oral Q6H PRN Nicole Kindred A, DO      . ipratropium-albuterol (DUONEB) 0.5-2.5 (3) MG/3ML nebulizer solution 3 mL  3 mL Nebulization Q6H Elwyn Reach, MD   3 mL at 04/16/20 0229  . lisinopril (ZESTRIL) tablet 10 mg   10 mg Oral QPM Nicole Kindred A, DO      . methylPREDNISolone sodium succinate (SOLU-MEDROL) 125 mg/2 mL injection 60 mg  60 mg Intravenous Q6H Gala Romney L, MD   60 mg at 04/16/20 1247   Followed by  . [START ON 04/17/2020] predniSONE (DELTASONE) tablet 40 mg  40 mg Oral Q breakfast Gala Romney L, MD      . metoprolol tartrate (LOPRESSOR) tablet 12.5 mg  12.5 mg Oral BID Nicole Kindred A, DO   12.5 mg at 04/16/20 0957  . pantoprazole (PROTONIX) EC tablet  40 mg  40 mg Oral Daily Nicole Kindred A, DO   40 mg at 04/16/20 0958  . polyethylene glycol (MIRALAX / GLYCOLAX) packet 17 g  17 g Oral Daily Nicole Kindred A, DO      . primidone (MYSOLINE) tablet 150 mg  150 mg Oral BID Nicole Kindred A, DO   150 mg at 04/16/20 1008  . rosuvastatin (CRESTOR) tablet 10 mg  10 mg Oral Daily Nicole Kindred A, DO      . vitamin B-12 (CYANOCOBALAMIN) tablet 100 mcg  100 mcg Oral QPM Nicole Kindred A, DO       Current Outpatient Medications  Medication Sig Dispense Refill  . albuterol (PROAIR HFA) 108 (90 Base) MCG/ACT inhaler Inhale 2 puffs into the lungs every 6 (six) hours as needed for wheezing or shortness of breath. 18 g 3  . ASPIRIN 81 PO Take 1 tablet by mouth daily.    . Cholecalciferol (VITAMIN D-3) 125 MCG (5000 UT) TABS Take 5,000 Units by mouth daily.    Marland Kitchen gabapentin (NEURONTIN) 300 MG capsule Take 300 mg by mouth 3 (three) times daily. (Take with 600 mg for total of 900 mg three times daily)    . gabapentin (NEURONTIN) 600 MG tablet Take 600 mg by mouth 3 (three) times daily. (Take with 300 mg for total of 900 mg three times daily)    . HYDROcodone-acetaminophen (NORCO) 5-325 MG tablet Take 1 tablet by mouth every 6 (six) hours as needed for moderate pain. 90 tablet 0  . lisinopril (ZESTRIL) 10 MG tablet TAKE 1 TABLET BY MOUTH EVERY EVENING 90 tablet 3  . metoprolol tartrate (LOPRESSOR) 25 MG tablet TAKE 1/2 TABLET BY MOUTH TWICE DAILY 90 tablet 3  . Multiple Vitamin (MULTIVITAMIN  WITH MINERALS) TABS tablet Take 1 tablet by mouth every evening.    . naloxone (NARCAN) nasal spray 4 mg/0.1 mL 1 spray into nostril upon signs of opioid overdose. Call 911. May repeat once if no response within 2-3 minutes. 1 each 0  . NON FORMULARY Take 500 mg by mouth daily. Hemp Oil 500 mg    . omeprazole (PRILOSEC) 20 MG capsule Take 20 mg by mouth every morning.     Marland Kitchen oxymetazoline (AFRIN) 0.05 % nasal spray Place 1 spray into both nostrils 2 (two) times daily.    . polyethylene glycol (MIRALAX / GLYCOLAX) packet Take 17 g by mouth daily.    . primidone (MYSOLINE) 50 MG tablet Take 150 mg by mouth 2 (two) times a day.    . rosuvastatin (CRESTOR) 10 MG tablet Take 1 tablet (10 mg total) by mouth daily. 90 tablet 1  . vitamin B-12 (CYANOCOBALAMIN) 100 MCG tablet Take 100 mcg by mouth every evening.    . vitamin E 400 UNIT capsule Take 400 Units by mouth every evening.     . fentaNYL (DURAGESIC) 12 MCG/HR Place 1 patch onto the skin every 3 (three) days. (Patient not taking: Reported on 04/16/2020) 5 patch 0  . fluticasone (FLONASE) 50 MCG/ACT nasal spray Place 2 sprays into both nostrils daily. (Patient not taking: Reported on 04/16/2020) 16 g 6  . oxyCODONE-acetaminophen (PERCOCET) 10-325 MG tablet Take 1 pill 1-2 hours prior to imaging/radiation treatments. 10 tablet 0  . OXYGEN Inhale 2 L into the lungs daily.    . predniSONE (DELTASONE) 20 MG tablet Take 2 tablets (40 mg total) by mouth daily with breakfast. (Patient not taking: Reported on 04/16/2020) 10 tablet 0     Review  of Systems Full ROS  was asked and was negative except for the information on the HPI  Physical Exam Blood pressure (!) 166/64, pulse 68, temperature 98 F (36.7 C), temperature source Oral, resp. rate 19, height 5\' 10"  (1.778 m), weight 83.9 kg, SpO2 96 %. CONSTITUTIONAL: Obvious dyspnea wearing oxygen nasal cannula using accessory respiratory muscles EYES: Pupils are equal, round,  Sclera are non-icteric. EARS,  NOSE, MOUTH AND THROAT: wearing a mask, hearing is intact to voice. LYMPH NODES:  Lymph nodes in the neck are normal. RESPIRATORY:   He does have decreased breath sounds on the right side pathologic use of accessory muscles. CARDIOVASCULAR: Heart is regular without murmurs, gallops, or rubs. GI: The abdomen is  soft, nontender, and nondistended. There are no palpable masses. There is no hepatosplenomegaly. There are normal bowel sounds in all quadrants. GU: Rectal deferred.   MUSCULOSKELETAL: Normal muscle strength and tone. No cyanosis or edema.   SKIN: Turgor is good and there are no pathologic skin lesions or ulcers. NEUROLOGIC: Motor and sensation is grossly normal. Cranial nerves are grossly intact. PSYCH:  Oriented to person, place and time. Affect is normal.  Data Reviewed  I have personally reviewed the patient's imaging, laboratory findings and medical records.    Assessment/Plan Clair Gulling is a 20 presents with asymptomatic right pneumothorax.  He does have significant comorbidities to include COPD bleb disease and a recent history of right lung cancer that received chemotherapy and currently undergoing radiation therapy.  I definitely think that he needs a drain within the pleural space to evacuate the pneumothorax and improve his ventilation.  Given his bleb disease COPD and multiple issues I do think that the ideal way of performing this is under image guided.  I have asked interventional's address to right BUT I will be able to say radiologist Dr. Annamaria Boots and he has agreed to perform this.  We will continue to follow.  No need for emergent surgical intervention at this time.  Monday Dr. Genevive Bi will be able to assess him and determining any other potential therapies.  He understands.    Caroleen Hamman, MD FACS General Surgeon 04/16/2020, 2:30 PM

## 2020-04-16 NOTE — H&P (View-Only) (Signed)
Patient ID: Joseph Graham, male   DOB: [birth year], 79 y.o.   MRN: [id number]  HPI Joseph Graham is a 79 y.o. male seen in consultation at the request of Dr. Arbutus Ped comes in with chest pain.  He does have significant history of COPD and is currently on 2 L nasal cannula at home he also reports chest pain that began last night.  Pain is mainly located to the right side it is sharp worsening with deep breathing.  He does have diagnosis of right upper lobe lung cancer and is undergoing radiation treatments.  Last time he had one was last week. I have ordered a CT scan which I have personally reviewed showing evidence of a large pneumothorax on the right side with a hydrothorax component.  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  His CMP is completely normal other than increasing glucose of 169, CBC is completely normal as well as INR and PTT.  He had a poor placement by Dr. Genevive Bi more than 4 years ago.  What staples along  HPI  Past Medical History:  Diagnosis Date  . Alcohol abuse, in remission    remote   . Arthritis    BACK AND NECK  . Benign essential tremor    improved on metoprolol and gabapentin  . BPH (benign prostatic hypertrophy) 12/30/2012  . Cardiac arrhythmia 03/2010   h/o a flutter and CM, s/p ablation, normal stress test  . Cataract    bilat removed   . Chemotherapy-induced neutropenia ([place]) 09/23/2015  . Chronic low back pain    MRI 2004, multilevel DDD  . Colon polyps    next colonoscopy due around 2018  . Complication of anesthesia age [age]   DID NOT GET COMPLETELY NUMB WITH TONSILLECTOMY  . COPD (chronic obstructive pulmonary disease) ([place])   . Coronary artery disease   . Dyslipidemia    mild off meds  . GERD (gastroesophageal reflux disease)   . History of kidney stones    h/o  . HTN (hypertension)   . Macular degeneration, bilateral   . Multiple pulmonary nodules 02/2015  . Neuromuscular disorder ([place])    neuropathy from chemo....in feet  . Peripheral neuropathy  due to chemotherapy ([place]) 12/23/2015   Saw Dr Manuella Ghazi, on gabapentin and cymbalta with hydrocodone PRN (03/2016)  . Personal history of tobacco use, presenting hazards to health 02/18/2015   quit 06/2011, restarted 2014  . Pneumothorax after biopsy 04/01/2015  . Pre-diabetes   . Primary cancer of bladder ([place]) 2016   [place]  . Primary lung cancer ([place]) 2016   port a cath removed 03/2016  . Shortness of breath dyspnea    OCC WITH EXERTION    Past Surgical History:  Procedure Laterality Date  . A FLUTTER ABLATION  03/2010  . carotid US  03/2010   WNL  . CATARACT EXTRACTION    . COLONOSCOPY  09/2011   polyps, rpt due 5 yrs, mild diverticulosis [name])  . CYSTOSCOPY W/ RETROGRADES Bilateral 04/06/2015   Procedure: CYSTOSCOPY WITH RETROGRADE PYELOGRAM;  Surgeon: [name], MD;  Location: [facility];  Service: Urology;  Laterality: Bilateral;  . ELECTROMAGNETIC NAVIGATION BROCHOSCOPY N/A 03/02/2015   Procedure: ELECTROMAGNETIC NAVIGATION BRONCHOSCOPY;  Surgeon: [name], MD;  Location: [facility];  Service: Cardiopulmonary;  Laterality: N/A;  . 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 HEMORROIDECTOMY  1982  . LUNG BIOPSY  collapsed lung with chest tube  . POLYPECTOMY    . PORT-A-CATH REMOVAL Right 04/03/2016   Procedure: REMOVAL PORT-A-CATH;  Surgeon: [name], MD;  Location: [facility];  Service: General;  Laterality: Right;  . PORTACATH PLACEMENT N/A 08/10/2015   Procedure: INSERTION PORT-A-CATH;  Surgeon: [name], MD;  Location: [facility];  Service: General;  Laterality: N/A;  . [place]  . TRANSURETHRAL RESECTION OF BLADDER TUMOR N/A 04/06/2015   Procedure: TRANSURETHRAL RESECTION OF BLADDER TUMOR (TURBT) right ureteral stent placement;  Surgeon: [name], MD;  Location: [facility];  Service: Urology;  Laterality: N/A;   . TRANSURETHRAL RESECTION OF BLADDER TUMOR N/A 07/13/2015   Procedure: TRANSURETHRAL RESECTION OF BLADDER TUMOR (TURBT);  Surgeon: [name], MD;  Location: [facility];  Service: Urology;  Laterality: N/A;  . urinary stent removal  04/14/15    Family History  Problem Relation Age of Onset  . Cervical cancer Mother 67  . Hypertension Father   . Tremor Father        and several uncles/aunts (not parkinson's)  . Stroke Brother 91  . Bladder Cancer Brother 44  . Prostate cancer Neg Hx   . Kidney cancer Neg Hx   . Colon cancer Neg Hx   . Colon polyps Neg Hx   . Esophageal cancer Neg Hx   . Rectal cancer Neg Hx     Social History Social History   Tobacco Use  . Smoking status: Former Smoker    Packs/day: 1.50    Years: 61.00    Pack years: 91.50    Types: Cigarettes    Quit date: 03/08/2015    Years since quitting: 5.1  . Smokeless tobacco: Never Used  . Tobacco comment: cut back 0.5 cigarettes--stopped 03/07/15; now now chantix  Vaping Use  . Vaping Use: Every day  . Substances: Nicotine  Substance Use Topics  . Alcohol use: No    Alcohol/week: 0.0 standard drinks    Comment: has not drank in 37 years  . Drug use: No    No Known Allergies  Current Facility-Administered Medications  Medication Dose Route Frequency Provider Last Rate Last Admin  . albuterol (PROVENTIL) (2.5 MG/3ML) 0.083% nebulizer solution 2.5 mg  2.5 mg Nebulization Q2H PRN Elwyn Reach, MD      . aspirin chewable tablet 81 mg  81 mg Oral Daily Nicole Kindred A, DO   81 mg at 04/16/20 0957  . gabapentin (NEURONTIN) tablet 900 mg  900 mg Oral TID Nicole Kindred A, DO   900 mg at 04/16/20 0957  . HYDROcodone-acetaminophen (NORCO/VICODIN) 5-325 MG per tablet 1 tablet  1 tablet Oral Q6H PRN Nicole Kindred A, DO      . ipratropium-albuterol (DUONEB) 0.5-2.5 (3) MG/3ML nebulizer solution 3 mL  3 mL Nebulization Q6H Elwyn Reach, MD   3 mL at 04/16/20 0229  . lisinopril (ZESTRIL) tablet 10 mg   10 mg Oral QPM Nicole Kindred A, DO      . methylPREDNISolone sodium succinate (SOLU-MEDROL) 125 mg/2 mL injection 60 mg  60 mg Intravenous Q6H Gala Romney L, MD   60 mg at 04/16/20 1247   Followed by  . [START ON 04/17/2020] predniSONE (DELTASONE) tablet 40 mg  40 mg Oral Q breakfast Gala Romney L, MD      . metoprolol tartrate (LOPRESSOR) tablet 12.5 mg  12.5 mg Oral BID Nicole Kindred A, DO   12.5 mg at 04/16/20 0957  . pantoprazole (PROTONIX) EC tablet  40 mg  40 mg Oral Daily Nicole Kindred A, DO   40 mg at 04/16/20 0958  . polyethylene glycol (MIRALAX / GLYCOLAX) packet 17 g  17 g Oral Daily Nicole Kindred A, DO      . primidone (MYSOLINE) tablet 150 mg  150 mg Oral BID Nicole Kindred A, DO   150 mg at 04/16/20 1008  . rosuvastatin (CRESTOR) tablet 10 mg  10 mg Oral Daily Nicole Kindred A, DO      . vitamin B-12 (CYANOCOBALAMIN) tablet 100 mcg  100 mcg Oral QPM Nicole Kindred A, DO       Current Outpatient Medications  Medication Sig Dispense Refill  . albuterol (PROAIR HFA) 108 (90 Base) MCG/ACT inhaler Inhale 2 puffs into the lungs every 6 (six) hours as needed for wheezing or shortness of breath. 18 g 3  . ASPIRIN 81 PO Take 1 tablet by mouth daily.    . Cholecalciferol (VITAMIN D-3) 125 MCG (5000 UT) TABS Take 5,000 Units by mouth daily.    Marland Kitchen gabapentin (NEURONTIN) 300 MG capsule Take 300 mg by mouth 3 (three) times daily. (Take with 600 mg for total of 900 mg three times daily)    . gabapentin (NEURONTIN) 600 MG tablet Take 600 mg by mouth 3 (three) times daily. (Take with 300 mg for total of 900 mg three times daily)    . HYDROcodone-acetaminophen (NORCO) 5-325 MG tablet Take 1 tablet by mouth every 6 (six) hours as needed for moderate pain. 90 tablet 0  . lisinopril (ZESTRIL) 10 MG tablet TAKE 1 TABLET BY MOUTH EVERY EVENING 90 tablet 3  . metoprolol tartrate (LOPRESSOR) 25 MG tablet TAKE 1/2 TABLET BY MOUTH TWICE DAILY 90 tablet 3  . Multiple Vitamin (MULTIVITAMIN  WITH MINERALS) TABS tablet Take 1 tablet by mouth every evening.    . naloxone (NARCAN) nasal spray 4 mg/0.1 mL 1 spray into nostril upon signs of opioid overdose. Call 911. May repeat once if no response within 2-3 minutes. 1 each 0  . NON FORMULARY Take 500 mg by mouth daily. Hemp Oil 500 mg    . omeprazole (PRILOSEC) 20 MG capsule Take 20 mg by mouth every morning.     Marland Kitchen oxymetazoline (AFRIN) 0.05 % nasal spray Place 1 spray into both nostrils 2 (two) times daily.    . polyethylene glycol (MIRALAX / GLYCOLAX) packet Take 17 g by mouth daily.    . primidone (MYSOLINE) 50 MG tablet Take 150 mg by mouth 2 (two) times a day.    . rosuvastatin (CRESTOR) 10 MG tablet Take 1 tablet (10 mg total) by mouth daily. 90 tablet 1  . vitamin B-12 (CYANOCOBALAMIN) 100 MCG tablet Take 100 mcg by mouth every evening.    . vitamin E 400 UNIT capsule Take 400 Units by mouth every evening.     . fentaNYL (DURAGESIC) 12 MCG/HR Place 1 patch onto the skin every 3 (three) days. (Patient not taking: Reported on 04/16/2020) 5 patch 0  . fluticasone (FLONASE) 50 MCG/ACT nasal spray Place 2 sprays into both nostrils daily. (Patient not taking: Reported on 04/16/2020) 16 g 6  . oxyCODONE-acetaminophen (PERCOCET) 10-325 MG tablet Take 1 pill 1-2 hours prior to imaging/radiation treatments. 10 tablet 0  . OXYGEN Inhale 2 L into the lungs daily.    . predniSONE (DELTASONE) 20 MG tablet Take 2 tablets (40 mg total) by mouth daily with breakfast. (Patient not taking: Reported on 04/16/2020) 10 tablet 0     Review  of Systems Full ROS  was asked and was negative except for the information on the HPI  Physical Exam Blood pressure (!) 166/64, pulse 68, temperature 98 F (36.7 C), temperature source Oral, resp. rate 19, height 5\' 10"  (1.778 m), weight 83.9 kg, SpO2 96 %. CONSTITUTIONAL: Obvious dyspnea wearing oxygen nasal cannula using accessory respiratory muscles EYES: Pupils are equal, round,  Sclera are non-icteric. EARS,  NOSE, MOUTH AND THROAT: wearing a mask, hearing is intact to voice. LYMPH NODES:  Lymph nodes in the neck are normal. RESPIRATORY:   He does have decreased breath sounds on the right side pathologic use of accessory muscles. CARDIOVASCULAR: Heart is regular without murmurs, gallops, or rubs. GI: The abdomen is  soft, nontender, and nondistended. There are no palpable masses. There is no hepatosplenomegaly. There are normal bowel sounds in all quadrants. GU: Rectal deferred.   MUSCULOSKELETAL: Normal muscle strength and tone. No cyanosis or edema.   SKIN: Turgor is good and there are no pathologic skin lesions or ulcers. NEUROLOGIC: Motor and sensation is grossly normal. Cranial nerves are grossly intact. PSYCH:  Oriented to person, place and time. Affect is normal.  Data Reviewed  I have personally reviewed the patient's imaging, laboratory findings and medical records.    Assessment/Plan Joseph Graham is a 68 presents with asymptomatic right pneumothorax.  He does have significant comorbidities to include COPD bleb disease and a recent history of right lung cancer that received chemotherapy and currently undergoing radiation therapy.  I definitely think that he needs a drain within the pleural space to evacuate the pneumothorax and improve his ventilation.  Given his bleb disease COPD and multiple issues I do think that the ideal way of performing this is under image guided.  I have asked interventional's address to right BUT I will be able to say radiologist Dr. Annamaria Boots and he has agreed to perform this.  We will continue to follow.  No need for emergent surgical intervention at this time.  Monday Dr. Genevive Bi will be able to assess him and determining any other potential therapies.  He understands.    Caroleen Hamman, MD FACS General Surgeon 04/16/2020, 2:30 PM

## 2020-04-16 NOTE — Progress Notes (Signed)
PROGRESS NOTE    Joseph Graham   JME:[id number]  DOB: 07-18-1940  PCP: [name], MD    DOA: 04/15/2020 LOS: 1   Brief Narrative   Joseph Graham is a 79 y.o. male with medical history significant of right-sided lung cancer status post chemotherapy currently on radiation therapy, COPD with chronic hypoxic respiratory failure on 2 l/min oxygen at baseline, chronic pain syndrome, benign essential tremor, coronary artery disease, peripheral neuropathy secondary to chemotherapy, previous history of pneumothorax who presented to the ER on 04/15/20 with worsening shortness of breath, cough, increased oxygen requirement of 4 L/min, and chest discomfort.  Chest xray in the ED showed right basal pneumothorax thought to be secondary spontaneous due to bullous emphysema.  Chest tube was deferred in ER as patient stable and location felt to be high risk for bedside chest tube placement.    Patient also with COPD exacerbation being treated with IV steroids, nebulizer treatments.     Assessment & Plan   Principal Problem:   Pneumothorax on right Active Problems:   HTN (hypertension)   Benign essential tremor   COPD with hypoxia (HCC)   Malignant neoplasm of right upper lobe of lung (HCC)   Peripheral neuropathy due to chemotherapy (HCC)   HLD (hyperlipidemia)   Chronic pain syndrome   Secondary spontaneous pneumothorax on right - present on admission.  Hemodynamically stable.   CT chest this AM shows a right-sided hydropneumothorax, increased in size since chest xray yesterday. --Surgery consulted --Plan is for IR to place chest tube under CT guidance  Acute on chronic respiratory failure with hypoxia - due to above.  Baseline oxygen requirement 2 L/min.  Currently requiring 4 L/min, with significant dyspnea on any exertion.   --Supplemental oxygen as needed to keep sats at or above 88%, wean as tolerated  COPD with chronic hypoxic respiratory failure / Bullous emphysema -  complicated by secondary spontaneous pneumothorax as above.  Being treated for exacerbation, but has no wheezing and good air movement on exam today. --Scheduled Duonebs --Continue Solu-medrol 60 mg IV q6h and transition to Prednisone tomorrow --Mucinex  Malignant neoplasm of right upper lobe of lung - currently undergoing radiation therapy with Dr. Baruch Gouty for radiation.  Dr. Rogue Bussing is primary medical oncologist.  Hypertension -   Benign essential tremor - continue primidone  Peripheral neuropathy due to chemotherapy - continue home gabapentin 900 mg TID  Hyperlipidemia -   Chronic pain syndrome - due to chronic back and neck pain / arthritis, malignancy as well. --Home Norco ordered PRN   DVT prophylaxis: SCDs Start: 04/16/20 0111   Diet:  Diet Orders (From admission, onward)    Start     Ordered   04/16/20 0111  Diet Heart Room service appropriate? Yes; Fluid consistency: Thin  Diet effective now       Question Answer Comment  Room service appropriate? Yes   Fluid consistency: Thin      04/16/20 0110            Code Status: Full Code    Subjective 04/16/20    Pt seen in ED this AM holding for a bed.  He asks if he will go home today, insists he will be going home today.  Declines to have chest tube placed when I mentioned he may need one.  Agreeable to be seen by surgery for their opinion.  He was short of breath when I entered the room, had just been up to bathroom, recovered well.  He has chronic and currently bothersome back pain, esp tailbone.  Says he's had progressive shortness of breath over several days.  Does not feel tight and wheezy more than his normal, but has been using his as needed albuterol inhaler more often.     Disposition Plan & Communication   Status is: Inpatient  Remains inpatient appropriate because:Inpatient level of care appropriate due to severity of illness, has pneumothorax requiring placement of chest tube.   Dispo: The patient  is from: Home              Anticipated d/c is to: Home              Anticipated d/c date is: > 3 days              Patient currently is not medically stable to d/c.   Family Communication: wife was not at bedside during rounds, will attempt to call this afternoon.    Consults, Procedures, Significant Events   Consultants:   CT surgery  Procedures:   None  Antimicrobials:  Anti-infectives (From admission, onward)   Start     Dose/Rate Route Frequency Ordered Stop   04/16/20 0115  cefTRIAXone (ROCEPHIN) 1 g in sodium chloride 0.9 % 100 mL IVPB  Status:  Discontinued        1 g 200 mL/hr over 30 Minutes Intravenous Every 24 hours 04/16/20 0110 04/16/20 0848         Objective   Vitals:   04/16/20 0730 04/16/20 0748 04/16/20 0828 04/16/20 0830  BP: (!) 167/54   (!) 181/62  Pulse: 86   92  Resp: 16   14  Temp:      TempSrc:      SpO2: 91% 92%  (!) 89%  Weight:   83.9 kg   Height:   5\' 10"  (1.778 m)     Intake/Output Summary (Last 24 hours) at 04/16/2020 0852 Last data filed at 04/16/2020 9518 Gross per 24 hour  Intake 100 ml  Output --  Net 100 ml   Filed Weights   04/15/20 2059 04/16/20 0828  Weight: 83.9 kg 83.9 kg    Physical Exam:  General exam: awake, alert, no acute distress HEENT: moist mucus membranes, hearing grossly normal  Respiratory system: CTAB but diminished on right especially right base, no expiratory wheezes or rhonchi, mildly increased respiratory effort, intermitted desaturations to upper 89's with conversation. Cardiovascular system: normal S1/S2, RRR, no pedal edema.   Central nervous system: A&O x4. no gross focal neurologic deficits, normal speech Extremities: moves all, no cyanosis, normal tone Skin: dry, intact, normal temperature Psychiatry: normal mood, congruent affect  Labs   Data Reviewed: I have personally reviewed following labs and imaging studies  CBC: Recent Labs  Lab 04/15/20 2139 04/16/20 0500  WBC 9.0 5.9   NEUTROABS 7.5  --   HGB 14.1 12.6*  HCT 45.0 39.4  MCV 99.8 98.3  PLT 243 841   Basic Metabolic Panel: Recent Labs  Lab 04/15/20 2139 04/16/20 0500  NA 137 138  K 4.3 4.4  CL 94* 96*  CO2 32 28  GLUCOSE 140* 169*  BUN 22 23  CREATININE 0.58* 0.76  CALCIUM 9.3 9.3   GFR: Estimated Creatinine Clearance: 77.3 mL/min (by C-G formula based on SCr of 0.76 mg/dL). Liver Function Tests: Recent Labs  Lab 04/15/20 2139 04/16/20 0500  AST 26 33  ALT 19 16  ALKPHOS 144* 124  BILITOT 0.4 0.5  PROT 9.1* 8.2*  ALBUMIN 4.4 3.9   No results for input(s): LIPASE, AMYLASE in the last 168 hours. No results for input(s): AMMONIA in the last 168 hours. Coagulation Profile: No results for input(s): INR, PROTIME in the last 168 hours. Cardiac Enzymes: No results for input(s): CKTOTAL, CKMB, CKMBINDEX, TROPONINI in the last 168 hours. BNP (last 3 results) No results for input(s): PROBNP in the last 8760 hours. HbA1C: No results for input(s): HGBA1C in the last 72 hours. CBG: No results for input(s): GLUCAP in the last 168 hours. Lipid Profile: No results for input(s): CHOL, HDL, LDLCALC, TRIG, CHOLHDL, LDLDIRECT in the last 72 hours. Thyroid Function Tests: No results for input(s): TSH, T4TOTAL, FREET4, T3FREE, THYROIDAB in the last 72 hours. Anemia Panel: No results for input(s): VITAMINB12, FOLATE, FERRITIN, TIBC, IRON, RETICCTPCT in the last 72 hours. Sepsis Labs: No results for input(s): PROCALCITON, LATICACIDVEN in the last 168 hours.  Recent Results (from the past 240 hour(s))  Respiratory Panel by RT PCR (Flu A&B, Covid) - Nasopharyngeal Swab     Status: None   Collection Time: 04/15/20  9:39 PM   Specimen: Nasopharyngeal Swab  Result Value Ref Range Status   SARS Coronavirus 2 by RT PCR NEGATIVE NEGATIVE Final    Comment: (NOTE) SARS-CoV-2 target nucleic acids are NOT DETECTED.  The SARS-CoV-2 RNA is generally detectable in upper respiratoy specimens during the acute  phase of infection. The lowest concentration of SARS-CoV-2 viral copies this assay can detect is 131 copies/mL. A negative result does not preclude SARS-Cov-2 infection and should not be used as the sole basis for treatment or other patient management decisions. A negative result may occur with  improper specimen collection/handling, submission of specimen other than nasopharyngeal swab, presence of viral mutation(s) within the areas targeted by this assay, and inadequate number of viral copies (<131 copies/mL). A negative result must be combined with clinical observations, patient history, and epidemiological information. The expected result is Negative.  Fact Sheet for Patients:  PinkCheek.be  Fact Sheet for Healthcare Providers:  GravelBags.it  This test is no t yet approved or cleared by the [place] FDA and  has been authorized for detection and/or diagnosis of SARS-CoV-2 by FDA under an Emergency Use Authorization (EUA). This EUA will remain  in effect (meaning this test can be used) for the duration of the COVID-19 declaration under Section 564(b)(1) of the Act, 21 U.S.C. section 360bbb-3(b)(1), unless the authorization is terminated or revoked sooner.     Influenza A by PCR NEGATIVE NEGATIVE Final   Influenza B by PCR NEGATIVE NEGATIVE Final    Comment: (NOTE) The Xpert Xpress SARS-CoV-2/FLU/RSV assay is intended as an aid in  the diagnosis of influenza from Nasopharyngeal swab specimens and  should not be used as a sole basis for treatment. Nasal washings and  aspirates are unacceptable for Xpert Xpress SARS-CoV-2/FLU/RSV  testing.  Fact Sheet for Patients: PinkCheek.be  Fact Sheet for Healthcare Providers: GravelBags.it  This test is not yet approved or cleared by the [place] FDA and  has been authorized for detection and/or diagnosis of  SARS-CoV-2 by  FDA under an Emergency Use Authorization (EUA). This EUA will remain  in effect (meaning this test can be used) for the duration of the  Covid-19 declaration under Section 564(b)(1) of the Act, 21  U.S.C. section 360bbb-3(b)(1), unless the authorization is  terminated or revoked. Performed at [facility], [address] [place], [place] [address]       Imaging Studies  DG Chest Portable 1 View  Result Date: 04/15/2020 CLINICAL DATA:  Shortness of breath EXAM: PORTABLE CHEST 1 VIEW COMPARISON:  04/15/2020 FINDINGS: Right pneumothorax again noted, unchanged. Heart is normal size. No confluent airspace opacities. No acute bony abnormality. IMPRESSION: Stable right pneumothorax. Electronically Signed   By: Rolm Baptise M.D.   On: 04/15/2020 23:23   DG Chest Portable 1 View  Result Date: 04/15/2020 CLINICAL DATA:  Chest pain and shortness of breath, history of carcinoma of the lung EXAM: PORTABLE CHEST 1 VIEW COMPARISON:  03/15/2020 FINDINGS: Previously seen chest wall port is been removed in the interval. Apical mass with fiducial markers is noted on the right. The dominant nodule in the posterior aspect of the right lung base is not as well appreciated as on prior PET-CT. New right basilar pneumothorax is seen. This extends along the lateral aspect of the right chest. Left lung remains clear. IMPRESSION: New right-sided pneumothorax. This is prominently in the right base laterally although a component does extend superiorly near the apex. Changes in the right apex with fiducial markers are noted consistent with the given clinical history. The lower lobe nodule is not as well appreciated in part due to the pneumothorax but the overlying hemidiaphragm. Critical Value/emergent results were called by telephone at the time of interpretation on 04/15/2020 at 9:34 pm to Dr. Carrie Mew , who verbally acknowledged these results. Electronically Signed   By: Inez Catalina M.D.    On: 04/15/2020 21:37     Medications   Scheduled Meds: . aspirin  81 mg Oral Daily  . gabapentin  600 mg Oral TID  . ipratropium-albuterol  3 mL Nebulization Q6H  . lisinopril  10 mg Oral QPM  . methylPREDNISolone (SOLU-MEDROL) injection  60 mg Intravenous Q6H   Followed by  . [START ON 04/17/2020] predniSONE  40 mg Oral Q breakfast  . metoprolol tartrate  12.5 mg Oral BID  . pantoprazole  40 mg Oral Daily  . polyethylene glycol  17 g Oral Daily  . primidone  150 mg Oral BID  . rosuvastatin  10 mg Oral Daily  . vitamin B-12  100 mcg Oral QPM   Continuous Infusions:     LOS: 1 day    Time spent: 30 minutes    Ezekiel Slocumb, DO [facility]  04/16/2020, 8:52 AM    If 7PM-7AM, please contact night-coverage. How to contact the [facility] Attending or Consulting provider [place] or covering provider during after hours [place], for this patient?    1. Check the care team in [facility] and look for a) attending/consulting [facility] provider listed and b) the [facility] team listed 2. Log into [email or web address] and use [place]'s universal password to access. If you do not have the password, please contact the hospital operator. 3. Locate the [facility] provider you are looking for under [facility] and page to a number that you can be directly reached. 4. If you still have difficulty reaching the provider, please page the [facility] (Director on Call) for the Hospitalists listed on amion for assistance.

## 2020-04-16 NOTE — ED Notes (Signed)
Pt to CT at this time.

## 2020-04-16 NOTE — Interval H&P Note (Signed)
History and Physical Interval Note:  04/16/2020 3:21 PM  Joseph Graham  has presented today for CT chest tube on the right with the diagnosis of large right pneumothorax.  The various methods of treatment have been discussed with the patient and family. After consideration of risks, benefits and other options for treatment, the patient has consented to  CT chest tube placement as intervention.  The patient's history has been reviewed, patient examined, no change in status, stable for surgery.  I have reviewed the patient's chart and labs.  Questions were answered to the patient's satisfaction.     Greggory Keen

## 2020-04-16 NOTE — ED Notes (Signed)
Pt back from CT. Pt in bed resting with lunch tray. Pt up in sitting position. Pt placed back on monitor. Chest tube hooked to suction and placed on lower side of bed. No tangles noted. Pt given call bell. Wife at bedside.

## 2020-04-16 NOTE — Hospital Course (Addendum)
Joseph Graham is a 79 y.o. male with medical history significant of right-sided lung cancer status post chemotherapy currently on radiation therapy, COPD with chronic hypoxic respiratory failure on 2 l/min oxygen at baseline, chronic pain syndrome, benign essential tremor, coronary artery disease, peripheral neuropathy secondary to chemotherapy, previous history of pneumothorax who presented to the ER on 04/15/20 with worsening shortness of breath, cough, increased oxygen requirement of 4 L/min, and chest discomfort.  Chest xray in the ED showed right basal pneumothorax thought to be secondary spontaneous due to bullous emphysema.  Chest tube was deferred in ER as patient stable and location felt to be high risk for bedside chest tube placement.    Patient also with COPD exacerbation being treated with IV steroids, nebulizer treatments.

## 2020-04-16 NOTE — ED Notes (Signed)
Family at bedside. 

## 2020-04-16 NOTE — ED Notes (Signed)
Report given to RN at this time.

## 2020-04-16 NOTE — Progress Notes (Signed)
PT Cancellation Note  Patient Details Name: Joseph Graham MRN: [id number] DOB: 29-Sep-1940   Cancelled Treatment:    Reason Eval/Treat Not Completed: Other (comment). Consult received and chart reviewed. Pt currently with pneumothorax on R chest. Per notes, pending chest xray and CT for possible expansion of pneumothorax and/or PE. Not appropriate for exertional activity at this time. Will hold and re-attempt when medically stable.   Dustine Stickler 04/16/2020, 10:34 AM Greggory Stallion, PT, DPT [phone]

## 2020-04-16 NOTE — ED Notes (Signed)
Pt back from CT. Pt resting at this time with family at bedside. Pt denies any needs at this time.

## 2020-04-16 NOTE — ED Notes (Signed)
Pt assisted with toileting while standing on side of bed. Pt assisted back to bed at this time. Pt tolerated well

## 2020-04-16 NOTE — Procedures (Signed)
Interventional Radiology Procedure Note  Procedure: CT RIGHT CHEST TUBE INSERTION    Complications: None  Estimated Blood Loss:  MIN  Findings: 14 FR TUBE INSERTED    Tamera Punt, MD

## 2020-04-16 NOTE — ED Notes (Signed)
Report received from CT at this time

## 2020-04-16 NOTE — Progress Notes (Signed)
OT Cancellation Note  Patient Details Name: Joseph Graham MRN: [id number] DOB: 09-23-1940   Cancelled Treatment:    Reason Eval/Treat Not Completed: Patient not medically ready. Consult received and chart reviewed. Pt currently with pneumothorax on R chest. Per notes, pending chest xray and CT for possible expansion of pneumothorax and/or PE. Not appropriate for exertional activity at this time. Will hold and re-attempt when medically stable.  Dessie Coma, M.S. OTR/L  04/16/20, 1:34 PM  ascom [phone]

## 2020-04-17 ENCOUNTER — Inpatient Hospital Stay: Payer: Medicare Other

## 2020-04-17 ENCOUNTER — Encounter: Payer: Self-pay | Admitting: Internal Medicine

## 2020-04-17 DIAGNOSIS — J939 Pneumothorax, unspecified: Secondary | ICD-10-CM

## 2020-04-17 DIAGNOSIS — I10 Essential (primary) hypertension: Secondary | ICD-10-CM

## 2020-04-17 MED ORDER — ENSURE ENLIVE PO LIQD: 237.0000 mL | Freq: Three times a day (TID) | ORAL | Status: DC

## 2020-04-17 MED ORDER — CHLORHEXIDINE GLUCONATE CLOTH 2 % EX PADS: 6.0000 | MEDICATED_PAD | Freq: Every day | CUTANEOUS | Status: DC

## 2020-04-17 MED ORDER — IPRATROPIUM-ALBUTEROL 0.5-2.5 (3) MG/3ML IN SOLN
3.0000 mL | Freq: Three times a day (TID) | RESPIRATORY_TRACT | Status: DC
  Administered 2020-04-18: 3 mL via RESPIRATORY_TRACT
  Filled 2020-04-17 (×2): qty 3

## 2020-04-17 MED ORDER — ADULT MULTIVITAMIN W/MINERALS CH
1.0000 | ORAL_TABLET | Freq: Every day | ORAL | Status: DC
  Filled 2020-04-17: qty 1

## 2020-04-17 NOTE — Evaluation (Signed)
Physical Therapy Evaluation Patient Details Name: Joseph Graham MRN: [id number] DOB: 1941-01-27 Today's Date: 04/17/2020   History of Present Illness  "Joseph Gulling" Graham is a 79 y/o male with complaints of worsening SOB and cough. Pt found to have R pneumothorax and had chest tube placed 11/6. PMH includes: R sided lung cancer status post chemo currently on radiation, COPD, chronic pain syndrome, denign essential tumor, CAD, peripheral neuropathy secondary to chemo, previous history of pneumothorax, chronic LBP, and HTN.  Clinical Impression  Pt received sitting up in recliner chair in good spirits with wife present in room. Pt agreeable to PT evaluation. Pt with good and equivalent strength bilaterally in UE and LE. Dr. Dahlia Byes arrived mid session and removed chest tube from wall suction. Pt performed sit <> stand transfers with supervision. Pt then ambulated 40 feet in room using SPC in R hand with decreased gait speed, flexed trunk and reciprocal gait pattern however no overt LOB or unsteadiness noted. Pt initially provided CGA however progressed to SBA for safety. Pt remained on 4L O2 via nasal cannula throughout session. Gait speed partially limited due to line/lead management. Pt currently with deficits in strength, balance, functional activity tolerance, and functional mobility. Pt is very close to baseline level of function reguarding ambulation and transfers and anticipate no PT needs at discharge. Will follow pt during acute stay to address current deficits and attempt stair negotiation prior to discharge home. Pt has all needed equipment already so none recommended.  Vitals: Pre: 76 HR, 93%, 133/53 During: O2 sats reading showed 85% however question accuracy as pt grasping SPC on hand with O2 sensor; finger straightened and numbers increased Post: 80 HR, 93%    Follow Up Recommendations No PT follow up    Equipment Recommendations  None recommended by PT    Recommendations for Other  Services       Precautions / Restrictions Precautions Precautions: Fall Precaution Comments: chest tube Restrictions Weight Bearing Restrictions: No      Mobility  Bed Mobility               General bed mobility comments: not performed - pt in recliner upon arrival    Transfers Overall transfer level: Needs assistance Equipment used: None Transfers: Sit to/from Stand Sit to Stand: Supervision         General transfer comment: SBA for safety however pt able to rise/sit without external physical assistance  Ambulation/Gait Ambulation/Gait assistance: Min guard;Supervision Gait Distance (Feet): 40 Feet Assistive device: Straight cane Gait Pattern/deviations: Step-through pattern;Decreased step length - right;Decreased step length - left;Trunk flexed Gait velocity: decreased   General Gait Details: initially CGA for safety then progressed to SBA; reciprocal pattern with decreased step lengths bilaterally; pt remains flexed forward at hips throughout ambulation distance; noted steady gait  Stairs            Wheelchair Mobility    Modified Rankin (Stroke Patients Only)       Balance Overall balance assessment: Needs assistance Sitting-balance support: No upper extremity supported;Feet supported Sitting balance-Leahy Scale: Good Sitting balance - Comments: no loss of balance sitting with back unsupported in recliner chair   Standing balance support: Single extremity supported Standing balance-Leahy Scale: Fair Standing balance comment: CGA the SBA for safety; fair to good balance with SPC in R hand                             Pertinent Vitals/Pain Pain Assessment:  No/denies pain    Home Living Family/patient expects to be discharged to:: Private residence Living Arrangements: Spouse/significant other Available Help at Discharge: Family;Available 24 hours/day Type of Home: House Home Access: Stairs to enter Entrance Stairs-Rails:  None Entrance Stairs-Number of Steps: 1 Home Layout: Two level Home Equipment: Cane - single point;Walker - 4 wheels;Shower seat;Grab bars - tub/shower Additional Comments: Pt states that he sleeps in a recliner chair and is on 2L O2 at baseline at home.    Prior Function Level of Independence: Independent with assistive device(s)         Comments: Pt reports using SPC in the home and rollator for community ambulation; reports 1 fall in the last 6 months which he calls a "trip" placing his cane on his foot and trying to step he fell to the ground with no resulting injuries; pt requires use of BUE to stand.     Hand Dominance        Extremity/Trunk Assessment   Upper Extremity Assessment Upper Extremity Assessment: Overall WFL for tasks assessed;Generalized weakness (grossly 4/5 bilaterally)    Lower Extremity Assessment Lower Extremity Assessment: Overall WFL for tasks assessed;Generalized weakness (grossly 4 to 4+/5 bilaterally)       Communication   Communication: No difficulties  Cognition Arousal/Alertness: Awake/alert Behavior During Therapy: WFL for tasks assessed/performed Overall Cognitive Status: Within Functional Limits for tasks assessed                                 General Comments: Pt A&O x 4.      General Comments General comments (skin integrity, edema, etc.): pt reported decreased sensation in bilateral feet (R worse than L) and in bilateral hands    Exercises     Assessment/Plan    PT Assessment Patient needs continued PT services  PT Problem List Decreased strength;Decreased balance;Decreased mobility;Cardiopulmonary status limiting activity       PT Treatment Interventions DME instruction;Gait training;Stair training;Functional mobility training;Therapeutic activities;Therapeutic exercise;Balance training;Patient/family education    PT Goals (Current goals can be found in the Care Plan section)  Acute Rehab PT Goals Patient  Stated Goal: to go home today PT Goal Formulation: With patient Time For Goal Achievement: 05/01/20 Potential to Achieve Goals: Good    Frequency Min 2X/week   Barriers to discharge        Co-evaluation               AM-PAC PT "6 Clicks" Mobility  Outcome Measure Help needed turning from your back to your side while in a flat bed without using bedrails?: A Little Help needed moving from lying on your back to sitting on the side of a flat bed without using bedrails?: A Lot Help needed moving to and from a bed to a chair (including a wheelchair)?: A Little Help needed standing up from a chair using your arms (e.g., wheelchair or bedside chair)?: A Little Help needed to walk in hospital room?: A Little Help needed climbing 3-5 steps with a railing? : A Lot 6 Click Score: 16    End of Session Equipment Utilized During Treatment: Oxygen;Other (comment) (4L via nasal cannula) Activity Tolerance: Patient tolerated treatment well Patient left: in chair;with call bell/phone within reach;with family/visitor present Nurse Communication: Mobility status;Other (comment) (surgeon (Dr. Dahlia Byes) removed suction on chest tube) PT Visit Diagnosis: Unsteadiness on feet (R26.81);Other abnormalities of gait and mobility (R26.89);Muscle weakness (generalized) (M62.81);History of falling (Z91.81)  Time: 0623-7628 PT Time Calculation (min) (ACUTE ONLY): 32 min   Charges:              Vale Haven, SPT  Vale Haven 04/17/2020, 12:36 PM

## 2020-04-17 NOTE — Progress Notes (Signed)
CC: PTX Subjective: Feels much better dyspnea improving.  Objective: Vital signs in last 24 hours: Temp:  [98.2 F (36.8 C)] 98.2 F (36.8 C) (11/07 1535) Pulse Rate:  [55-102] 80 (11/07 1535) Resp:  [11-29] 20 (11/07 1535) BP: (101-164)/(36-95) 136/52 (11/07 1535) SpO2:  [90 %-100 %] 94 % (11/07 1535)    Intake/Output from previous day: No intake/output data recorded. Intake/Output this shift: No intake/output data recorded.  Physical exam:  Debilitated elderly male wearing oxygen Chest: decrease BS bilaterally, CT in place no air leak  Lab Results: CBC  Recent Labs    04/15/20 2139 04/16/20 0500  WBC 9.0 5.9  HGB 14.1 12.6*  HCT 45.0 39.4  PLT 243 232   BMET Recent Labs    04/15/20 2139 04/16/20 0500  NA 137 138  K 4.3 4.4  CL 94* 96*  CO2 32 28  GLUCOSE 140* 169*  BUN 22 23  CREATININE 0.58* 0.76  CALCIUM 9.3 9.3   PT/INR Recent Labs    04/16/20 1245  LABPROT 13.2  INR 1.0   ABG No results for input(s): PHART, HCO3 in the last 72 hours.  Invalid input(s): PCO2, PO2  Studies/Results: CT CHEST WO CONTRAST  Result Date: 04/16/2020 CLINICAL DATA:  79 year old male with history of chest pain and worsening shortness of breath with exertion. History of right-sided pneumothorax. EXAM: CT CHEST WITHOUT CONTRAST TECHNIQUE: Multidetector CT imaging of the chest was performed following the standard protocol without IV contrast. COMPARISON:  Chest CT 02/24/2020.  Chest x-ray 04/15/2020. FINDINGS: Cardiovascular: Heart size is normal. There is no significant pericardial fluid, thickening or pericardial calcification. There is aortic atherosclerosis, as well as atherosclerosis of the great vessels of the mediastinum and the coronary arteries, including calcified atherosclerotic plaque in the left main, left anterior descending, left circumflex and right coronary arteries. Severe calcifications of the aortic valve and mitral annulus. Aberrant right subclavian  artery (normal anatomical variant) incidentally noted. Mediastinum/Nodes: No pathologically enlarged mediastinal or hilar lymph nodes. Please note that accurate exclusion of hilar adenopathy is limited on noncontrast CT scans. Esophagus is unremarkable in appearance. No axillary lymphadenopathy. Lungs/Pleura: Moderate to large right pneumothorax. Trace volume of dependent pleural fluid in the lower right hemithorax. Right lung is partially collapsed, but there continue to be multiple pulmonary nodules seen in the lungs bilaterally (right greater than left), largest of which is in the posterior aspect of the right lower lobe (axial image 90 of series 3) measuring 2.1 x 1.9 cm, and in the right upper lobe near the apex (axial image 25 of series 3) measuring 1.8 x 1.3 cm. No acute consolidative airspace disease. No left pleural effusion. Diffuse bronchial wall thickening with moderate centrilobular and paraseptal emphysema. Upper Abdomen: Aortic atherosclerosis. Musculoskeletal: There are no aggressive appearing lytic or blastic lesions noted in the visualized portions of the skeleton. IMPRESSION: 1. Right-sided hydropneumothorax with moderate to large pneumothorax component and trace volume of pleural fluid in the right hemithorax. Based on comparison with prior chest x-ray 04/15/2020, this hydropneumothorax appears likely increased in size. 2. Bilateral pulmonary nodules again noted, with slight enlargement compared to the prior study from 02/24/2020, as detailed above. 3. Diffuse bronchial wall thickening with moderate centrilobular and paraseptal emphysema; imaging findings suggestive of underlying COPD. 4. Aortic atherosclerosis, in addition to left main and 3 vessel coronary artery disease. Assessment for potential risk factor modification, dietary therapy or pharmacologic therapy may be warranted, if clinically indicated. 5. There are calcifications of the aortic valve and mitral  annulus. Echocardiographic  correlation for evaluation of potential valvular dysfunction may be warranted if clinically indicated. Aortic Atherosclerosis (ICD10-I70.0) and Emphysema (ICD10-J43.9). Electronically Signed   By: Vinnie Langton M.D.   On: 04/16/2020 11:20   DG Chest Port 1 View  Result Date: 04/16/2020 CLINICAL DATA:  Chest pain, shortness of breath, right pneumothorax EXAM: PORTABLE CHEST 1 VIEW COMPARISON:  04/15/2020 FINDINGS: Interval increase in the right pneumothorax with larger medial and basilar components of pleural air. Background emphysema noted. Similar postop change and right apical scar-like nodular opacity. Medial left base atelectasis. No large effusion. Stable heart size. No mediastinal shift. Trachea midline. IMPRESSION: Interval increase in the right pneumothorax with larger medial and basilar components Electronically Signed   By: Jerilynn Mages.  Shick M.D.   On: 04/16/2020 17:05   DG Chest Portable 1 View  Result Date: 04/15/2020 CLINICAL DATA:  Shortness of breath EXAM: PORTABLE CHEST 1 VIEW COMPARISON:  04/15/2020 FINDINGS: Right pneumothorax again noted, unchanged. Heart is normal size. No confluent airspace opacities. No acute bony abnormality. IMPRESSION: Stable right pneumothorax. Electronically Signed   By: Rolm Baptise M.D.   On: 04/15/2020 23:23   DG Chest Portable 1 View  Result Date: 04/15/2020 CLINICAL DATA:  Chest pain and shortness of breath, history of carcinoma of the lung EXAM: PORTABLE CHEST 1 VIEW COMPARISON:  03/15/2020 FINDINGS: Previously seen chest wall port is been removed in the interval. Apical mass with fiducial markers is noted on the right. The dominant nodule in the posterior aspect of the right lung base is not as well appreciated as on prior PET-CT. New right basilar pneumothorax is seen. This extends along the lateral aspect of the right chest. Left lung remains clear. IMPRESSION: New right-sided pneumothorax. This is prominently in the right base laterally although a  component does extend superiorly near the apex. Changes in the right apex with fiducial markers are noted consistent with the given clinical history. The lower lobe nodule is not as well appreciated in part due to the pneumothorax but the overlying hemidiaphragm. Critical Value/emergent results were called by telephone at the time of interpretation on 04/15/2020 at 9:34 pm to Dr. Carrie Mew , who verbally acknowledged these results. Electronically Signed   By: Inez Catalina M.D.   On: 04/15/2020 21:37   CT IMAGE GUIDED DRAINAGE BY PERCUTANEOUS CATHETER  Result Date: 04/16/2020 INDICATION: Chest pain, shortness of breath, pneumothorax EXAM: CT-GUIDED 14 FRENCH RIGHT CHEST TUBE INSERTION MEDICATIONS: 1% LIDOCAINE LOCAL ANESTHESIA/SEDATION: Fentanyl 50 mcg IV; Versed 2.0 mg IV Moderate Sedation Time:  27 MINUTES The patient was continuously monitored during the procedure by the interventional radiology nurse under my direct supervision. COMPLICATIONS: None. PROCEDURE: Informed written consent was obtained from the patient after a thorough discussion of the procedural risks, benefits and alternatives. All questions were addressed. Maximal Sterile Barrier Technique was utilized including caps, mask, sterile gowns, sterile gloves, sterile drape, hand hygiene and skin antiseptic. A timeout was performed prior to the initiation of the procedure. Previous imaging reviewed. Patient positioned supine. Noncontrast localization CT performed. The large right pneumothorax was localized. A lower intercostal space in the mid axillary line was marked for access. Under sterile conditions and local anesthesia, an 18 gauge 10 cm access needle was advanced under CT guidance into the right pleural space. Needle position confirmed with CT. Guidewire inserted followed by tract dilatation insert a 14 French drain. Drain catheter position confirmed with CT. Catheter connected to external pleura vac at 20 cm water low wall suction.  Following this, repeat CT imaging demonstrates significant improvement in the pneumothorax with near complete right lung reinflation. Catheter secured with Prolene suture and connected to pleura vac at water seal. Sterile dressing applied. No immediate complication. Patient tolerated the procedure well. IMPRESSION: Successful CT-guided 14 French right chest tube insertion Electronically Signed   By: Jerilynn Mages.  Shick M.D.   On: 04/16/2020 16:58    Anti-infectives: Anti-infectives (From admission, onward)   Start     Dose/Rate Route Frequency Ordered Stop   04/16/20 0115  cefTRIAXone (ROCEPHIN) 1 g in sodium chloride 0.9 % 100 mL IVPB  Status:  Discontinued        1 g 200 mL/hr over 30 Minutes Intravenous Every 24 hours 04/16/20 0110 04/16/20 0848      Assessment/Plan: ReCurrent pneumothorax, no airleak,  placed chest tube to waterseal.  Okay to ambulate patient on waterseal.  Dr. Genevive Bi to see the patient tomorrow and determined definitive Rx.  There is no need for surgical intervention at this time.  Please note I spent at least 25 minutes in this encounter with greater than 50% spent in coordination and counseling of his care  Caroleen Hamman, MD, [facility]  04/17/2020

## 2020-04-17 NOTE — ED Notes (Signed)
PT in room, meds deferred until PT complete.

## 2020-04-17 NOTE — Progress Notes (Signed)
OT Cancellation Note  Patient Details Name: SKYE RODARTE MRN: [id number] DOB: [birth year]   Cancelled Treatment:    Reason Eval/Treat Not Completed: OT screened, no needs identified, will sign off. OT order received and chart reviewed. Per PT pt is SBA for mobility. Per conversation c pt and wife in room, pt reports at baseline [place] for ADLs. Denies need for skilled OT intervention. Pt educated on how to request OT consult if new needs arise. Will sign off at this time.   Dessie Coma, M.S. OTR/L  04/17/20, 1:23 PM  ascom [phone]

## 2020-04-17 NOTE — Plan of Care (Signed)
Continuing with plan of care. 

## 2020-04-17 NOTE — Progress Notes (Signed)
Initial Nutrition Assessment  DOCUMENTATION CODES:   Not applicable  INTERVENTION:   Ensure Enlive po TID, each supplement provides 350 kcal and 20 grams of protein  MVI daily   NUTRITION DIAGNOSIS:   Increased nutrient needs related to cancer and cancer related treatments, other (see comment) (COPD) as evidenced by estimated needs.  GOAL:   Patient will meet greater than or equal to 90% of their needs  MONITOR:   PO intake, Supplement acceptance, Labs, Weight trends, Skin, I & O's  REASON FOR ASSESSMENT:   Consult Assessment of nutrition requirement/status  ASSESSMENT:   79 y.o. male with medical history significant of right-sided lung cancer status post chemotherapy currently on radiation therapy, COPD, chronic pain syndrome, benign essential tremor, coronary artery disease, peripheral neuropathy secondary to chemotherapy, previous history of pneumothorax who presented to the ER with worsening shortness of breath and cough. Pt found to have pneumothorax now s/p chest tube placement 11/6  RD working remotely.  Unable to assess pt as pt remains in the ED. RD will add supplements and MVI to help pt meet his estimated needs. Per chart, pt appears fairly weight stable pta. RD will obtain nutrition related history and exam at follow-up and once pt admitted to the hospital floor.   Medications reviewed and include: aspirin, protonix, miralax, prednisone, B12  Labs reviewed:   NUTRITION - FOCUSED PHYSICAL EXAM: Unable to perform at this time   Diet Order:   Diet Order            Diet regular Room service appropriate? Yes; Fluid consistency: Thin  Diet effective now                EDUCATION NEEDS:   No education needs have been identified at this time  Skin:   not assessed  Last BM:  pta  Height:   Ht Readings from Last 1 Encounters:  04/16/20 5\' 10"  (1.778 m)    Weight:   Wt Readings from Last 1 Encounters:  04/16/20 83.9 kg    Ideal Body Weight:   75.45 kg  BMI:  Body mass index is 26.54 kg/m.  Estimated Nutritional Needs:   Kcal:  2100-2400kcal/day  Protein:  105-120g/day  Fluid:  1.9-2.2L/day  Koleen Distance MS, RD, LDN Please refer to [facility] for RD and/or RD on-call/weekend/after hours pager

## 2020-04-17 NOTE — Progress Notes (Signed)
PROGRESS NOTE    Joseph Graham   NIO:[id number]  DOB: Jul 23, 1940  PCP: [name], MD    DOA: 04/15/2020 LOS: 2   Brief Narrative   Joseph Graham is a 79 y.o. male with medical history significant of right-sided lung cancer status post chemotherapy currently on radiation therapy, COPD with chronic hypoxic respiratory failure on 2 l/min oxygen at baseline, chronic pain syndrome, benign essential tremor, coronary artery disease, peripheral neuropathy secondary to chemotherapy, previous history of pneumothorax who presented to the ER on 04/15/20 with worsening shortness of breath, cough, increased oxygen requirement of 4 L/min, and chest discomfort.  Chest xray in the ED showed right basal pneumothorax thought to be secondary spontaneous due to bullous emphysema.  Chest tube was deferred in ER as patient stable and location felt to be high risk for bedside chest tube placement.    Patient also with COPD exacerbation being treated with IV steroids, nebulizer treatments.     Assessment & Plan   Principal Problem:   Pneumothorax on right Active Problems:   HTN (hypertension)   Benign essential tremor   COPD with hypoxia (HCC)   Malignant neoplasm of right upper lobe of lung (HCC)   Peripheral neuropathy due to chemotherapy (HCC)   HLD (hyperlipidemia)   Chronic pain syndrome   Secondary spontaneous pneumothorax on right - present on admission.  Hemodynamically stable.   CT chest this AM shows a large right-sided hydropneumothorax, increased in size.  11/6 - R chest tube placed by IR under CT guidance 11/7 - chest tube to water seal, small air leak on coughing, on 4 L/min O2 --Surgery consulted, appreciate assistance  Acute on chronic respiratory failure with hypoxia - due to above.  Baseline oxygen requirement 2 L/min.  Currently requiring 4 L/min, with significant dyspnea on any exertion.   --Supplemental oxygen as needed to keep sats at or above 88%, wean as  tolerated  COPD with chronic hypoxic respiratory failure / Bullous emphysema - complicated by secondary spontaneous pneumothorax as above.  Being treated for exacerbation, but has no wheezing and good air movement on exam today. --Scheduled Duonebs --Prednisone 40 mg daily, may stop tomorrow  --Mucinex  Malignant neoplasm of right upper lobe of lung - currently undergoing radiation therapy with Dr. Baruch Gouty for radiation.  Dr. Rogue Bussing is primary medical oncologist.  Hypertension - continue lisinopril, Lopressor  Benign essential tremor - continue primidone  Peripheral neuropathy due to chemotherapy - continue home gabapentin 900 mg TID  Hyperlipidemia - Crestor  Chronic pain syndrome - due to chronic back and neck pain / arthritis, malignancy as well. --Home Norco ordered PRN   DVT prophylaxis: SCDs Start: 04/16/20 0111   Diet:  Diet Orders (From admission, onward)    Start     Ordered   04/17/20 0722  Diet regular Room service appropriate? Yes; Fluid consistency: Thin  Diet effective now       Question Answer Comment  Room service appropriate? Yes   Fluid consistency: Thin      04/17/20 0721            Code Status: Full Code    Subjective 04/17/20    Pt seen in ED this AM still holding for a bed.  Reports feeling a lot better today, not short of breath.  Is having increase in ability to produce sputum with coughing which he was told would happen with radiation.  No fever/chills, does not feeling tight in chest or wheezy.  No other  acute complaints, but hopes to be in a regular room soon.     Disposition Plan & Communication   Status is: Inpatient  Remains inpatient appropriate because:Inpatient level of care appropriate due to severity of illness, has pneumothorax requiring placement of chest tube.   Dispo: The patient is from: Home              Anticipated d/c is to: Home              Anticipated d/c date is: > 3 days              Patient currently is not  medically stable to d/c.   Family Communication: wife was at bedside during rounds today 11/7   Consults, Procedures, Significant Events   Consultants:   CT surgery  Procedures:   None  Antimicrobials:  Anti-infectives (From admission, onward)   Start     Dose/Rate Route Frequency Ordered Stop   04/16/20 0115  cefTRIAXone (ROCEPHIN) 1 g in sodium chloride 0.9 % 100 mL IVPB  Status:  Discontinued        1 g 200 mL/hr over 30 Minutes Intravenous Every 24 hours 04/16/20 0110 04/16/20 0848         Objective   Vitals:   04/17/20 0700 04/17/20 0900 04/17/20 1200 04/17/20 1338  BP: (!) 128/59 (!) 145/57 (!) 153/53 (!) 142/43  Pulse: 62 62 (!) 102 74  Resp: (!) 24 16 11 19   Temp:    98.2 F (36.8 C)  TempSrc:    Oral  SpO2: 96% 98% 93% 95%  Weight:      Height:       No intake or output data in the 24 hours ending 04/17/20 1443 Filed Weights   04/15/20 2059 04/16/20 0828  Weight: 83.9 kg 83.9 kg    Physical Exam:  General exam: awake, alert, no acute distress HEENT: moist mucus membranes, hearing grossly normal  Respiratory system: improved aeration on right, CTAB, no expiratory wheezes, normal respiratory effort, no conversational dyspnea, chest tube to water seal with small leak on coughing Cardiovascular system: normal S1/S2, RRR, no pedal edema.   Central nervous system: A&O x4. no gross focal neurologic deficits, normal speech Psychiatry: normal mood, congruent affect, normal judgment and insight  Labs   Data Reviewed: I have personally reviewed following labs and imaging studies  CBC: Recent Labs  Lab 04/15/20 2139 04/16/20 0500  WBC 9.0 5.9  NEUTROABS 7.5  --   HGB 14.1 12.6*  HCT 45.0 39.4  MCV 99.8 98.3  PLT 243 099   Basic Metabolic Panel: Recent Labs  Lab 04/15/20 2139 04/16/20 0500  NA 137 138  K 4.3 4.4  CL 94* 96*  CO2 32 28  GLUCOSE 140* 169*  BUN 22 23  CREATININE 0.58* 0.76  CALCIUM 9.3 9.3   GFR: Estimated Creatinine  Clearance: 77.3 mL/min (by C-G formula based on SCr of 0.76 mg/dL). Liver Function Tests: Recent Labs  Lab 04/15/20 2139 04/16/20 0500  AST 26 33  ALT 19 16  ALKPHOS 144* 124  BILITOT 0.4 0.5  PROT 9.1* 8.2*  ALBUMIN 4.4 3.9   No results for input(s): LIPASE, AMYLASE in the last 168 hours. No results for input(s): AMMONIA in the last 168 hours. Coagulation Profile: Recent Labs  Lab 04/16/20 1245  INR 1.0   Cardiac Enzymes: No results for input(s): CKTOTAL, CKMB, CKMBINDEX, TROPONINI in the last 168 hours. BNP (last 3 results) No results for input(s): PROBNP in  the last 8760 hours. HbA1C: No results for input(s): HGBA1C in the last 72 hours. CBG: No results for input(s): GLUCAP in the last 168 hours. Lipid Profile: No results for input(s): CHOL, HDL, LDLCALC, TRIG, CHOLHDL, LDLDIRECT in the last 72 hours. Thyroid Function Tests: No results for input(s): TSH, T4TOTAL, FREET4, T3FREE, THYROIDAB in the last 72 hours. Anemia Panel: No results for input(s): VITAMINB12, FOLATE, FERRITIN, TIBC, IRON, RETICCTPCT in the last 72 hours. Sepsis Labs: No results for input(s): PROCALCITON, LATICACIDVEN in the last 168 hours.  Recent Results (from the past 240 hour(s))  Respiratory Panel by RT PCR (Flu A&B, Covid) - Nasopharyngeal Swab     Status: None   Collection Time: 04/15/20  9:39 PM   Specimen: Nasopharyngeal Swab  Result Value Ref Range Status   SARS Coronavirus 2 by RT PCR NEGATIVE NEGATIVE Final    Comment: (NOTE) SARS-CoV-2 target nucleic acids are NOT DETECTED.  The SARS-CoV-2 RNA is generally detectable in upper respiratoy specimens during the acute phase of infection. The lowest concentration of SARS-CoV-2 viral copies this assay can detect is 131 copies/mL. A negative result does not preclude SARS-Cov-2 infection and should not be used as the sole basis for treatment or other patient management decisions. A negative result may occur with  improper specimen  collection/handling, submission of specimen other than nasopharyngeal swab, presence of viral mutation(s) within the areas targeted by this assay, and inadequate number of viral copies (<131 copies/mL). A negative result must be combined with clinical observations, patient history, and epidemiological information. The expected result is Negative.  Fact Sheet for Patients:  PinkCheek.be  Fact Sheet for Healthcare Providers:  GravelBags.it  This test is no t yet approved or cleared by the [place] FDA and  has been authorized for detection and/or diagnosis of SARS-CoV-2 by FDA under an Emergency Use Authorization (EUA). This EUA will remain  in effect (meaning this test can be used) for the duration of the COVID-19 declaration under Section 564(b)(1) of the Act, 21 U.S.C. section 360bbb-3(b)(1), unless the authorization is terminated or revoked sooner.     Influenza A by PCR NEGATIVE NEGATIVE Final   Influenza B by PCR NEGATIVE NEGATIVE Final    Comment: (NOTE) The Xpert Xpress SARS-CoV-2/FLU/RSV assay is intended as an aid in  the diagnosis of influenza from Nasopharyngeal swab specimens and  should not be used as a sole basis for treatment. Nasal washings and  aspirates are unacceptable for Xpert Xpress SARS-CoV-2/FLU/RSV  testing.  Fact Sheet for Patients: PinkCheek.be  Fact Sheet for Healthcare Providers: GravelBags.it  This test is not yet approved or cleared by the [place] FDA and  has been authorized for detection and/or diagnosis of SARS-CoV-2 by  FDA under an Emergency Use Authorization (EUA). This EUA will remain  in effect (meaning this test can be used) for the duration of the  Covid-19 declaration under Section 564(b)(1) of the Act, 21  U.S.C. section 360bbb-3(b)(1), unless the authorization is  terminated or revoked. Performed at [facility], [address] [place], [place] [address]       Imaging Studies   CT CHEST WO CONTRAST  Result Date: 04/16/2020 CLINICAL DATA:  79 year old male with history of chest pain and worsening shortness of breath with exertion. History of right-sided pneumothorax. EXAM: CT CHEST WITHOUT CONTRAST TECHNIQUE: Multidetector CT imaging of the chest was performed following the standard protocol without IV contrast. COMPARISON:  Chest CT 02/24/2020.  Chest x-ray 04/15/2020. FINDINGS: Cardiovascular: Heart size is  normal. There is no significant pericardial fluid, thickening or pericardial calcification. There is aortic atherosclerosis, as well as atherosclerosis of the great vessels of the mediastinum and the coronary arteries, including calcified atherosclerotic plaque in the left main, left anterior descending, left circumflex and right coronary arteries. Severe calcifications of the aortic valve and mitral annulus. Aberrant right subclavian artery (normal anatomical variant) incidentally noted. Mediastinum/Nodes: No pathologically enlarged mediastinal or hilar lymph nodes. Please note that accurate exclusion of hilar adenopathy is limited on noncontrast CT scans. Esophagus is unremarkable in appearance. No axillary lymphadenopathy. Lungs/Pleura: Moderate to large right pneumothorax. Trace volume of dependent pleural fluid in the lower right hemithorax. Right lung is partially collapsed, but there continue to be multiple pulmonary nodules seen in the lungs bilaterally (right greater than left), largest of which is in the posterior aspect of the right lower lobe (axial image 90 of series 3) measuring 2.1 x 1.9 cm, and in the right upper lobe near the apex (axial image 25 of series 3) measuring 1.8 x 1.3 cm. No acute consolidative airspace disease. No left pleural effusion. Diffuse bronchial wall thickening with moderate centrilobular and paraseptal emphysema. Upper Abdomen: Aortic atherosclerosis.  Musculoskeletal: There are no aggressive appearing lytic or blastic lesions noted in the visualized portions of the skeleton. IMPRESSION: 1. Right-sided hydropneumothorax with moderate to large pneumothorax component and trace volume of pleural fluid in the right hemithorax. Based on comparison with prior chest x-ray 04/15/2020, this hydropneumothorax appears likely increased in size. 2. Bilateral pulmonary nodules again noted, with slight enlargement compared to the prior study from 02/24/2020, as detailed above. 3. Diffuse bronchial wall thickening with moderate centrilobular and paraseptal emphysema; imaging findings suggestive of underlying COPD. 4. Aortic atherosclerosis, in addition to left main and 3 vessel coronary artery disease. Assessment for potential risk factor modification, dietary therapy or pharmacologic therapy may be warranted, if clinically indicated. 5. There are calcifications of the aortic valve and mitral annulus. Echocardiographic correlation for evaluation of potential valvular dysfunction may be warranted if clinically indicated. Aortic Atherosclerosis (ICD10-I70.0) and Emphysema (ICD10-J43.9). Electronically Signed   By: Vinnie Langton M.D.   On: 04/16/2020 11:20   DG Chest Port 1 View  Result Date: 04/16/2020 CLINICAL DATA:  Chest pain, shortness of breath, right pneumothorax EXAM: PORTABLE CHEST 1 VIEW COMPARISON:  04/15/2020 FINDINGS: Interval increase in the right pneumothorax with larger medial and basilar components of pleural air. Background emphysema noted. Similar postop change and right apical scar-like nodular opacity. Medial left base atelectasis. No large effusion. Stable heart size. No mediastinal shift. Trachea midline. IMPRESSION: Interval increase in the right pneumothorax with larger medial and basilar components Electronically Signed   By: Jerilynn Mages.  Shick M.D.   On: 04/16/2020 17:05   DG Chest Portable 1 View  Result Date: 04/15/2020 CLINICAL DATA:  Shortness of  breath EXAM: PORTABLE CHEST 1 VIEW COMPARISON:  04/15/2020 FINDINGS: Right pneumothorax again noted, unchanged. Heart is normal size. No confluent airspace opacities. No acute bony abnormality. IMPRESSION: Stable right pneumothorax. Electronically Signed   By: Rolm Baptise M.D.   On: 04/15/2020 23:23   DG Chest Portable 1 View  Result Date: 04/15/2020 CLINICAL DATA:  Chest pain and shortness of breath, history of carcinoma of the lung EXAM: PORTABLE CHEST 1 VIEW COMPARISON:  03/15/2020 FINDINGS: Previously seen chest wall port is been removed in the interval. Apical mass with fiducial markers is noted on the right. The dominant nodule in the posterior aspect of the right lung base is not as  well appreciated as on prior PET-CT. New right basilar pneumothorax is seen. This extends along the lateral aspect of the right chest. Left lung remains clear. IMPRESSION: New right-sided pneumothorax. This is prominently in the right base laterally although a component does extend superiorly near the apex. Changes in the right apex with fiducial markers are noted consistent with the given clinical history. The lower lobe nodule is not as well appreciated in part due to the pneumothorax but the overlying hemidiaphragm. Critical Value/emergent results were called by telephone at the time of interpretation on 04/15/2020 at 9:34 pm to Dr. Carrie Mew , who verbally acknowledged these results. Electronically Signed   By: Inez Catalina M.D.   On: 04/15/2020 21:37   CT IMAGE GUIDED DRAINAGE BY PERCUTANEOUS CATHETER  Result Date: 04/16/2020 INDICATION: Chest pain, shortness of breath, pneumothorax EXAM: CT-GUIDED 14 FRENCH RIGHT CHEST TUBE INSERTION MEDICATIONS: 1% LIDOCAINE LOCAL ANESTHESIA/SEDATION: Fentanyl 50 mcg IV; Versed 2.0 mg IV Moderate Sedation Time:  62 MINUTES The patient was continuously monitored during the procedure by the interventional radiology nurse under my direct supervision. COMPLICATIONS: None.  PROCEDURE: Informed written consent was obtained from the patient after a thorough discussion of the procedural risks, benefits and alternatives. All questions were addressed. Maximal Sterile Barrier Technique was utilized including caps, mask, sterile gowns, sterile gloves, sterile drape, hand hygiene and skin antiseptic. A timeout was performed prior to the initiation of the procedure. Previous imaging reviewed. Patient positioned supine. Noncontrast localization CT performed. The large right pneumothorax was localized. A lower intercostal space in the mid axillary line was marked for access. Under sterile conditions and local anesthesia, an 18 gauge 10 cm access needle was advanced under CT guidance into the right pleural space. Needle position confirmed with CT. Guidewire inserted followed by tract dilatation insert a 14 French drain. Drain catheter position confirmed with CT. Catheter connected to external pleura vac at 20 cm water low wall suction. Following this, repeat CT imaging demonstrates significant improvement in the pneumothorax with near complete right lung reinflation. Catheter secured with Prolene suture and connected to pleura vac at water seal. Sterile dressing applied. No immediate complication. Patient tolerated the procedure well. IMPRESSION: Successful CT-guided 14 French right chest tube insertion Electronically Signed   By: Jerilynn Mages.  Shick M.D.   On: 04/16/2020 16:58     Medications   Scheduled Meds:  aspirin  81 mg Oral Daily   dextromethorphan-guaiFENesin  1 tablet Oral BID   feeding supplement  237 mL Oral TID BM   gabapentin  900 mg Oral TID   ipratropium-albuterol  3 mL Nebulization Q6H   lisinopril  10 mg Oral QPM   metoprolol tartrate  12.5 mg Oral BID   [START ON 04/18/2020] multivitamin with minerals  1 tablet Oral Daily   pantoprazole  40 mg Oral Daily   polyethylene glycol  17 g Oral Daily   predniSONE  40 mg Oral Q breakfast   primidone  150 mg Oral BID    rosuvastatin  10 mg Oral Daily   vitamin B-12  100 mcg Oral QPM   Continuous Infusions:     LOS: 2 days    Time spent: 30 minutes with > 50% spent in coordination of care and direct patient contact.    Ezekiel Slocumb, DO [facility]  04/17/2020, 2:43 PM    If 7PM-7AM, please contact night-coverage. How to contact the [facility] Attending or Consulting provider [place] or covering provider during after hours [place], for  this patient?    1. Check the care team in [facility] and look for a) attending/consulting [facility] provider listed and b) the [facility] team listed 2. Log into [email or web address] and use [place]'s universal password to access. If you do not have the password, please contact the hospital operator. 3. Locate the [facility] provider you are looking for under [facility] and page to a number that you can be directly reached. 4. If you still have difficulty reaching the provider, please page the [facility] (Director on Call) for the Hospitalists listed on amion for assistance.

## 2020-04-17 NOTE — ED Notes (Signed)
Advised nurse that patient has assigned bed 

## 2020-04-18 ENCOUNTER — Inpatient Hospital Stay: Payer: Medicare Other

## 2020-04-18 DIAGNOSIS — J939 Pneumothorax, unspecified: Secondary | ICD-10-CM | POA: Diagnosis not present

## 2020-04-18 NOTE — Care Management Important Message (Signed)
Important Message  Patient Details  Name: Joseph Graham MRN: [id number] Date of Birth: April 04, 1941   Medicare Important Message Given:  N/A - LOS <3 / Initial given by admissions     [name] 04/18/2020, 7:48 AM

## 2020-04-18 NOTE — Discharge Summary (Signed)
Physician Discharge Summary  Patient ID: DICKSON KOSTELNIK MRN: [id number] DOB/AGE: 79-Feb-1942 79 y.o.  Admit date: 04/15/2020 Discharge date: 04/18/2020   Discharge Diagnoses:  Principal Problem:   Pneumothorax on right Active Problems:   HTN (hypertension)   Benign essential tremor   COPD with hypoxia (HCC)   Malignant neoplasm of right upper lobe of lung (HCC)   Peripheral neuropathy due to chemotherapy (HCC)   HLD (hyperlipidemia)   Chronic pain syndrome   Procedures:Insertion of right sided pigtail catheter  Hospital Course: Patient presented to the ER with chief complaint of shortness of breath.  CXray and CT showed a large right sided spontaneous pneumothorax.  Chest drain was placed with prompt expansion of his lung.  No air leak and chest tube removed after 24 hours of waterseal and no airleak or pneumothorax.  Discharge instructions given.  Disposition: Discharge disposition: 01-Home or Self Care       Discharge Instructions    Diet - low sodium heart healthy   Complete by: As directed    Discharge wound care:   Complete by: As directed    Remove dressing in 48 hours and wash over the area with soap and water.  Cover with BandAid if needed.   Increase activity slowly   Complete by: As directed      Allergies as of 04/18/2020   No Known Allergies     Medication List    TAKE these medications   albuterol 108 (90 Base) MCG/ACT inhaler Commonly known as: ProAir HFA Inhale 2 puffs into the lungs every 6 (six) hours as needed for wheezing or shortness of breath.   ASPIRIN 81 PO Take 1 tablet by mouth daily.   fentaNYL 12 MCG/HR Commonly known as: [place] 1 patch onto the skin every 3 (three) days.   fluticasone 50 MCG/ACT nasal spray Commonly known as: FLONASE Place 2 sprays into both nostrils daily.   gabapentin 300 MG capsule Commonly known as: NEURONTIN Take 300 mg by mouth 3 (three) times daily. (Take with 600 mg for total of 900 mg three  times daily)   gabapentin 600 MG tablet Commonly known as: NEURONTIN Take 600 mg by mouth 3 (three) times daily. (Take with 300 mg for total of 900 mg three times daily)   HYDROcodone-acetaminophen 5-325 MG tablet Commonly known as: Norco Take 1 tablet by mouth every 6 (six) hours as needed for moderate pain.   lisinopril 10 MG tablet Commonly known as: ZESTRIL TAKE 1 TABLET BY MOUTH EVERY EVENING   metoprolol tartrate 25 MG tablet Commonly known as: LOPRESSOR TAKE 1/2 TABLET BY MOUTH TWICE DAILY   multivitamin with minerals Tabs tablet Take 1 tablet by mouth every evening.   naloxone 4 MG/0.1ML Liqd nasal spray kit Commonly known as: NARCAN 1 spray into nostril upon signs of opioid overdose. Call 911. May repeat once if no response within 2-3 minutes.   NON FORMULARY Take 500 mg by mouth daily. Hemp Oil 500 mg   omeprazole 20 MG capsule Commonly known as: PRILOSEC Take 20 mg by mouth every morning.   oxyCODONE-acetaminophen 10-325 MG tablet Commonly known as: Percocet Take 1 pill 1-2 hours prior to imaging/radiation treatments.   OXYGEN Inhale 2 L into the lungs daily.   oxymetazoline 0.05 % nasal spray Commonly known as: AFRIN Place 1 spray into both nostrils 2 (two) times daily.   polyethylene glycol 17 g packet Commonly known as: MIRALAX / GLYCOLAX Take 17 g by mouth daily.   predniSONE  20 MG tablet Commonly known as: DELTASONE Take 2 tablets (40 mg total) by mouth daily with breakfast.   primidone 50 MG tablet Commonly known as: MYSOLINE Take 150 mg by mouth 2 (two) times a day.   rosuvastatin 10 MG tablet Commonly known as: Crestor Take 1 tablet (10 mg total) by mouth daily.   vitamin B-12 100 MCG tablet Commonly known as: CYANOCOBALAMIN Take 100 mcg by mouth every evening.   Vitamin D-3 125 MCG (5000 UT) Tabs Take 5,000 Units by mouth daily.   vitamin E 180 MG (400 UNITS) capsule Take 400 Units by mouth every evening.             Discharge Care Instructions  (From admission, onward)         Start     Ordered   04/18/20 0000  Discharge wound care:       Comments: Remove dressing in 48 hours and wash over the area with soap and water.  Cover with BandAid if needed.   04/18/20 [place], MD

## 2020-04-18 NOTE — Progress Notes (Signed)
Discharge home order verified.  MD has removed chest tube, dressing covered in gauze and tegaderm.  Reviewed AVS/home medicines, and follow up appointments with patient- patient states he will call cancer center to verify treatment dates, verbalized understanding of discharge instructions.  Vitals/assessment stable.  Patient has steady gait with walker.  IV removed.  Belongings with patient.  Removed from floor by NT with wheelchair.

## 2020-04-18 NOTE — Progress Notes (Signed)
PROGRESS NOTE    Joseph Graham   TFT:[id number]  DOB: [birth year]  PCP: [name], MD    DOA: 04/15/2020 LOS: 3   Brief Narrative   Joseph Graham is a 79 y.o. male with medical history significant of right-sided lung cancer status post chemotherapy currently on radiation therapy, COPD with chronic hypoxic respiratory failure on 2 l/min oxygen at baseline, chronic pain syndrome, benign essential tremor, coronary artery disease, peripheral neuropathy secondary to chemotherapy, previous history of pneumothorax who presented to the ER on 04/15/20 with worsening shortness of breath, cough, increased oxygen requirement of 4 L/min, and chest discomfort.  Chest xray in the ED showed right basal pneumothorax thought to be secondary spontaneous due to bullous emphysema.  Chest tube was deferred in ER as patient stable and location felt to be high risk for bedside chest tube placement.    Patient also with COPD exacerbation being treated with IV steroids, nebulizer treatments.     Assessment & Plan   Principal Problem:   Pneumothorax on right Active Problems:   HTN (hypertension)   Benign essential tremor   COPD with hypoxia (HCC)   Malignant neoplasm of right upper lobe of lung (HCC)   Peripheral neuropathy due to chemotherapy (HCC)   HLD (hyperlipidemia)   Chronic pain syndrome   Secondary spontaneous pneumothorax on right - present on admission.  Hemodynamically stable.   CT chest this AM shows a large right-sided hydropneumothorax, increased in size.  11/6 - R chest tube placed by IR under CT guidance 11/7 - chest tube to water seal, small air leak on coughing, on 4 L/min O2 11/8 - pt to have tube removed and likely d/c today (per Dr. Genevive Bi).  Xray showed very small residual apical PTX. --CT Surgery consulted, appreciate assistance  Acute on chronic respiratory failure with hypoxia - due to above.  Baseline oxygen requirement 2 L/min.  Currently requiring 4 L/min, with  significant dyspnea on any exertion.   --Supplemental oxygen as needed to keep sats at or above 88%, wean as tolerated  COPD with chronic hypoxic respiratory failure / Bullous emphysema - complicated by secondary spontaneous pneumothorax as above.  Being treated for exacerbation, but has no wheezing and good air movement on exam today. --Scheduled Duonebs --Prednisone 20mg  daily --Mucinex  Malignant neoplasm of right upper lobe of lung - currently undergoing radiation therapy with Dr. Baruch Gouty for radiation.  Dr. Rogue Bussing is primary medical oncologist.  Hypertension - continue lisinopril, Lopressor  Benign essential tremor - continue primidone  Peripheral neuropathy due to chemotherapy - continue home gabapentin 900 mg TID  Hyperlipidemia - Crestor  Chronic pain syndrome - due to chronic back and neck pain / arthritis, malignancy as well. --Home Norco ordered PRN   DVT prophylaxis: SCDs Start: 04/16/20 0111   Diet:  Diet Orders (From admission, onward)    Start     Ordered   04/18/20 0000  Diet - low sodium heart healthy        04/18/20 1328   04/17/20 0722  Diet regular Room service appropriate? Yes; Fluid consistency: Thin  Diet effective now       Question Answer Comment  Room service appropriate? Yes   Fluid consistency: Thin      04/17/20 0721            Code Status: Full Code    Subjective 04/18/20    Pt seen with wife at bedside.  Dr. Genevive Bi had been in, let patient know likely to  send home later today after tube removed.  Patient has no other acute complaints.  Breathing much better, not wheezing.   Disposition Plan & Communication   Status is: Inpatient  Remains inpatient appropriate because:Inpatient level of care appropriate due to severity of illness,    Dispo: The patient is from: Home              Anticipated d/c is to: Home              Anticipated d/c date is: 1 day vs today              Patient currently is not medically stable to  d/c.   Family Communication: wife was at bedside during rounds today 11/8   Consults, Procedures, Significant Events   Consultants:   CT surgery  Procedures:   None  Antimicrobials:  Anti-infectives (From admission, onward)   Start     Dose/Rate Route Frequency Ordered Stop   04/16/20 0115  cefTRIAXone (ROCEPHIN) 1 g in sodium chloride 0.9 % 100 mL IVPB  Status:  Discontinued        1 g 200 mL/hr over 30 Minutes Intravenous Every 24 hours 04/16/20 0110 04/16/20 0848         Objective   Vitals:   04/18/20 0320 04/18/20 0806 04/18/20 0820 04/18/20 1139  BP: (!) 119/51 132/60  (!) 111/54  Pulse: 65 (!) 58  61  Resp: 18 16  17   Temp: 97.6 F (36.4 C) 97.7 F (36.5 C)  97.8 F (36.6 C)  TempSrc: Oral Oral  Oral  SpO2: 93% 98% 99% 95%  Weight:      Height:        Intake/Output Summary (Last 24 hours) at 04/18/2020 1430 Last data filed at 04/18/2020 0744 Gross per 24 hour  Intake --  Output 30 ml  Net -30 ml   Filed Weights   04/15/20 2059 04/16/20 0828  Weight: 83.9 kg 83.9 kg    Physical Exam:  General exam: awake, alert, no acute distress Respiratory system: CTAB, no expiratory wheezes, normal respiratory effort, R chest tube to water seal Cardiovascular system: normal S1/S2, RRR, no pedal edema.   Central nervous system: A&O x4. no gross focal neurologic deficits, normal speech Psychiatry: normal mood, congruent affect, normal judgment and insight  Labs   Data Reviewed: I have personally reviewed following labs and imaging studies  CBC: Recent Labs  Lab 04/15/20 2139 04/16/20 0500  WBC 9.0 5.9  NEUTROABS 7.5  --   HGB 14.1 12.6*  HCT 45.0 39.4  MCV 99.8 98.3  PLT 243 767   Basic Metabolic Panel: Recent Labs  Lab 04/15/20 2139 04/16/20 0500  NA 137 138  K 4.3 4.4  CL 94* 96*  CO2 32 28  GLUCOSE 140* 169*  BUN 22 23  CREATININE 0.58* 0.76  CALCIUM 9.3 9.3   GFR: Estimated Creatinine Clearance: 77.3 mL/min (by C-G formula based on  SCr of 0.76 mg/dL). Liver Function Tests: Recent Labs  Lab 04/15/20 2139 04/16/20 0500  AST 26 33  ALT 19 16  ALKPHOS 144* 124  BILITOT 0.4 0.5  PROT 9.1* 8.2*  ALBUMIN 4.4 3.9   No results for input(s): LIPASE, AMYLASE in the last 168 hours. No results for input(s): AMMONIA in the last 168 hours. Coagulation Profile: Recent Labs  Lab 04/16/20 1245  INR 1.0   Cardiac Enzymes: No results for input(s): CKTOTAL, CKMB, CKMBINDEX, TROPONINI in the last 168 hours. BNP (last 3 results)  No results for input(s): PROBNP in the last 8760 hours. HbA1C: No results for input(s): HGBA1C in the last 72 hours. CBG: No results for input(s): GLUCAP in the last 168 hours. Lipid Profile: No results for input(s): CHOL, HDL, LDLCALC, TRIG, CHOLHDL, LDLDIRECT in the last 72 hours. Thyroid Function Tests: No results for input(s): TSH, T4TOTAL, FREET4, T3FREE, THYROIDAB in the last 72 hours. Anemia Panel: No results for input(s): VITAMINB12, FOLATE, FERRITIN, TIBC, IRON, RETICCTPCT in the last 72 hours. Sepsis Labs: No results for input(s): PROCALCITON, LATICACIDVEN in the last 168 hours.  Recent Results (from the past 240 hour(s))  Respiratory Panel by RT PCR (Flu A&B, Covid) - Nasopharyngeal Swab     Status: None   Collection Time: 04/15/20  9:39 PM   Specimen: Nasopharyngeal Swab  Result Value Ref Range Status   SARS Coronavirus 2 by RT PCR NEGATIVE NEGATIVE Final    Comment: (NOTE) SARS-CoV-2 target nucleic acids are NOT DETECTED.  The SARS-CoV-2 RNA is generally detectable in upper respiratoy specimens during the acute phase of infection. The lowest concentration of SARS-CoV-2 viral copies this assay can detect is 131 copies/mL. A negative result does not preclude SARS-Cov-2 infection and should not be used as the sole basis for treatment or other patient management decisions. A negative result may occur with  improper specimen collection/handling, submission of specimen other than  nasopharyngeal swab, presence of viral mutation(s) within the areas targeted by this assay, and inadequate number of viral copies (<131 copies/mL). A negative result must be combined with clinical observations, patient history, and epidemiological information. The expected result is Negative.  Fact Sheet for Patients:  PinkCheek.be  Fact Sheet for Healthcare Providers:  GravelBags.it  This test is no t yet approved or cleared by the [place] FDA and  has been authorized for detection and/or diagnosis of SARS-CoV-2 by FDA under an Emergency Use Authorization (EUA). This EUA will remain  in effect (meaning this test can be used) for the duration of the COVID-19 declaration under Section 564(b)(1) of the Act, 21 U.S.C. section 360bbb-3(b)(1), unless the authorization is terminated or revoked sooner.     Influenza A by PCR NEGATIVE NEGATIVE Final   Influenza B by PCR NEGATIVE NEGATIVE Final    Comment: (NOTE) The Xpert Xpress SARS-CoV-2/FLU/RSV assay is intended as an aid in  the diagnosis of influenza from Nasopharyngeal swab specimens and  should not be used as a sole basis for treatment. Nasal washings and  aspirates are unacceptable for Xpert Xpress SARS-CoV-2/FLU/RSV  testing.  Fact Sheet for Patients: PinkCheek.be  Fact Sheet for Healthcare Providers: GravelBags.it  This test is not yet approved or cleared by the [place] FDA and  has been authorized for detection and/or diagnosis of SARS-CoV-2 by  FDA under an Emergency Use Authorization (EUA). This EUA will remain  in effect (meaning this test can be used) for the duration of the  Covid-19 declaration under Section 564(b)(1) of the Act, 21  U.S.C. section 360bbb-3(b)(1), unless the authorization is  terminated or revoked. Performed at [facility], [address] [place], [place]  [address]       Imaging Studies   DG Chest 2 View  Result Date: 04/18/2020 CLINICAL DATA:  Recent pneumothorax EXAM: CHEST - 2 VIEW COMPARISON:  April 17, 2020 chest radiograph and chest CT April 16, 2020 FINDINGS: Chest tube present on the right anteriorly with trace apicolateral pneumothorax on the right. There is an ill-defined opacity in the right upper lobe with nearby  surgical clips measuring 2.0 x 1.7 cm, essentially stable from recent studies. No edema or airspace opacity evident. Heart is upper normal in size with pulmonary vascularity normal. No adenopathy. There is degenerative change in the thoracic spine. IMPRESSION: Trace apicolateral pneumothorax remains on the right. Chest tube in place. Nodular opacity with apparent scarring and nearby surgical clips right upper lobe toward the apex medially. Neoplastic focus in this area questioned. No edema or consolidation. Stable cardiac silhouette. Electronically Signed   By: Lowella Grip III M.D.   On: 04/18/2020 08:06   DG Chest Port 1 View  Result Date: 04/18/2020 CLINICAL DATA:  Chest tube placement for pneumothorax EXAM: PORTABLE CHEST 1 VIEW COMPARISON:  April 16, 2020 chest radiograph and chest CT FINDINGS: Chest tube placed on the right with minimal apicolateral pneumothorax on the right. Nodular opacity right upper lobe medially with nearby surgical clips is stable. No new opacity evident. Heart size and pulmonary vascular normal. No adenopathy. No bone lesions IMPRESSION: Minimal residual apicolateral pneumothorax on the right after chest tube placement. Nodular opacity medial right apex region persists. No new opacity evident. Stable cardiac silhouette. Electronically Signed   By: Lowella Grip III M.D.   On: 04/18/2020 08:07   CT IMAGE GUIDED DRAINAGE BY PERCUTANEOUS CATHETER  Result Date: 04/16/2020 INDICATION: Chest pain, shortness of breath, pneumothorax EXAM: CT-GUIDED 14 FRENCH RIGHT CHEST TUBE INSERTION MEDICATIONS:  1% LIDOCAINE LOCAL ANESTHESIA/SEDATION: Fentanyl 50 mcg IV; Versed 2.0 mg IV Moderate Sedation Time:  64 MINUTES The patient was continuously monitored during the procedure by the interventional radiology nurse under my direct supervision. COMPLICATIONS: None. PROCEDURE: Informed written consent was obtained from the patient after a thorough discussion of the procedural risks, benefits and alternatives. All questions were addressed. Maximal Sterile Barrier Technique was utilized including caps, mask, sterile gowns, sterile gloves, sterile drape, hand hygiene and skin antiseptic. A timeout was performed prior to the initiation of the procedure. Previous imaging reviewed. Patient positioned supine. Noncontrast localization CT performed. The large right pneumothorax was localized. A lower intercostal space in the mid axillary line was marked for access. Under sterile conditions and local anesthesia, an 18 gauge 10 cm access needle was advanced under CT guidance into the right pleural space. Needle position confirmed with CT. Guidewire inserted followed by tract dilatation insert a 14 French drain. Drain catheter position confirmed with CT. Catheter connected to external pleura vac at 20 cm water low wall suction. Following this, repeat CT imaging demonstrates significant improvement in the pneumothorax with near complete right lung reinflation. Catheter secured with Prolene suture and connected to pleura vac at water seal. Sterile dressing applied. No immediate complication. Patient tolerated the procedure well. IMPRESSION: Successful CT-guided 14 French right chest tube insertion Electronically Signed   By: Jerilynn Mages.  Shick M.D.   On: 04/16/2020 16:58     Medications   Scheduled Meds: . aspirin  81 mg Oral Daily  . dextromethorphan-guaiFENesin  1 tablet Oral BID  . feeding supplement  237 mL Oral TID BM  . gabapentin  900 mg Oral TID  . ipratropium-albuterol  3 mL Nebulization TID  . lisinopril  10 mg Oral QPM   . metoprolol tartrate  12.5 mg Oral BID  . multivitamin with minerals  1 tablet Oral Daily  . pantoprazole  40 mg Oral Daily  . polyethylene glycol  17 g Oral Daily  . predniSONE  40 mg Oral Q breakfast  . primidone  150 mg Oral BID  . rosuvastatin  10  mg Oral Daily  . vitamin B-12  100 mcg Oral QPM   Continuous Infusions:     LOS: 3 days    Time spent: 25 minutes with > 50% spent in coordination of care and direct patient contact.    Ezekiel Slocumb, DO [facility]  04/18/2020, 2:30 PM    If 7PM-7AM, please contact night-coverage. How to contact the [facility] Attending or Consulting provider [place] or covering provider during after hours [place], for this patient?    1. Check the care team in [facility] and look for a) attending/consulting [facility] provider listed and b) the [facility] team listed 2. Log into [email or web address] and use [place]'s universal password to access. If you do not have the password, please contact the hospital operator. 3. Locate the [facility] provider you are looking for under [facility] and page to a number that you can be directly reached. 4. If you still have difficulty reaching the provider, please page the [facility] (Director on Call) for the Hospitalists listed on amion for assistance.

## 2020-04-18 NOTE — Progress Notes (Signed)
Joseph Graham Follow Up Note  Patient ID: Joseph Graham, male   DOB: 1941/03/03, 79 y.o.   MRN: [id number]  HISTORY: No problems overnight.  No air leak with deep cough.  On waterseal for the last 24 hours.  Not short of breath.  Scheduled to get X RT tomorrow for his right lower lobe cancer.    Vitals:   04/18/20 0820 04/18/20 1139  BP:  (!) 111/54  Pulse:  61  Resp:  17  Temp:  97.8 F (36.6 C)  SpO2: 99% 95%     EXAM:  Resp: Lungs are clear bilaterally.  No respiratory distress, normal effort. Heart:  Regular without murmurs Abd:  Abdomen is soft, non distended and non tender. No masses are palpable.  There is no rebound and no guarding.  Neurological: Alert and oriented to person, place, and time. Coordination normal.  Skin: Skin is warm and dry. No rash noted. No diaphoretic. No erythema. No pallor.  Psychiatric: Normal mood and affect. Normal behavior. Judgment and thought content normal.   No air leak.  Independent review of CXRay shows very small pneumothorax   ASSESSMENT: Right spontaneous pneumothorax   PLAN:   I have removed his chest tube and given him his discharge instructions.  He will leave dressing in place for 48 hours and then remove.  To see me in one week.    [name], MDPatient ID: Joseph Graham, male   DOB: [birth year], 79 y.o.   MRN: [id number]

## 2020-04-19 ENCOUNTER — Ambulatory Visit: Payer: Medicare Other

## 2020-04-20 ENCOUNTER — Inpatient Hospital Stay (HOSPITAL_BASED_OUTPATIENT_CLINIC_OR_DEPARTMENT_OTHER): Payer: Medicare Other | Admitting: Hospice and Palliative Medicine

## 2020-04-20 ENCOUNTER — Ambulatory Visit: Payer: Medicare Other

## 2020-04-20 ENCOUNTER — Telehealth: Payer: Self-pay | Admitting: Internal Medicine

## 2020-04-20 DIAGNOSIS — Z515 Encounter for palliative care: Secondary | ICD-10-CM

## 2020-04-20 DIAGNOSIS — G893 Neoplasm related pain (acute) (chronic): Secondary | ICD-10-CM | POA: Diagnosis not present

## 2020-04-20 DIAGNOSIS — C3411 Malignant neoplasm of upper lobe, right bronchus or lung: Secondary | ICD-10-CM | POA: Diagnosis not present

## 2020-04-20 MED ORDER — HYDROCODONE-ACETAMINOPHEN 10-325 MG PO TABS: 1.0000 | ORAL_TABLET | Freq: Four times a day (QID) | ORAL | 0 refills | Status: DC | PRN

## 2020-04-20 NOTE — Telephone Encounter (Signed)
On 11/09-called patient to check on his recent admission to hospital for pneumothorax.  Patient interested speaking to [name] re: his pain management. Discussed with [name]. For now resume with RT.

## 2020-04-20 NOTE — Progress Notes (Signed)
Virtual Visit via Telephone Note  I connected with Joseph Graham on 04/20/20 at  1:00 PM EST by telephone and verified that I am speaking with the correct person using two identifiers.  Location: Patient: home Provider: clinic   I discussed the limitations, risks, security and privacy concerns of performing an evaluation and management service by telephone and the availability of in person appointments. I also discussed with the patient that there may be a patient responsible charge related to this service. The patient expressed understanding and agreed to proceed.   History of Present Illness: [name] is a 79 year old man with multiple medical problems including O2 dependent COPD, CAD, PAD, peripheral neuropathy, and lumbar stenosis with chronic back pain.  Additionally, patient has stage II lung cancer with CT of the chest on 02/24/2020 revealing interval growth and pulmonary nodules.  Plan is for XRT but patient's back pain has limited this option to date.  He is referred to palliative care to address goals and manage ongoing symptoms.   Observations/Objective: Patient was recently hospitalized with PTX requiring chest tube.  He is now home.  Patient reports that his breathing is back to baseline.  He says that he started the transdermal fentanyl and found that it was highly effective at improving his pain but he only left it on for a day or two before removing it due to concern that it was causing worsening shortness of breath.  He says that he is subsequently read the drug insert and believes it to be too high risk.  He says that he has found the hydrocodone to be most efficacious and would like to try a higher dose.  We will plan for him to take 2 Norco 5-325mg  tablets every 6 hours and will send in a new Rx for 10mg  dose.   Assessment and Plan: Stage II lung cancer -on XRT  Neoplasm related pain -exacerbated by chronic back pain.  DC oxycodone/fentanyl.  Increase Norco 10-325mg   Q6H PRN.   Case and plan discussed with Dr. Rogue Bussing  Follow Up Instructions: Virtual visit next week   I discussed the assessment and treatment plan with the patient. The patient was provided an opportunity to ask questions and all were answered. The patient agreed with the plan and demonstrated an understanding of the instructions.   The patient was advised to call back or seek an in-person evaluation if the symptoms worsen or if the condition fails to improve as anticipated.  I provided 20 minutes of non-face-to-face time during this encounter.   Irean Hong, NP

## 2020-04-21 ENCOUNTER — Ambulatory Visit: Payer: Medicare Other

## 2020-04-25 ENCOUNTER — Other Ambulatory Visit: Payer: Self-pay

## 2020-04-25 ENCOUNTER — Emergency Department: Payer: Medicare Other

## 2020-04-25 ENCOUNTER — Encounter: Payer: Self-pay | Admitting: Emergency Medicine

## 2020-04-25 DIAGNOSIS — M545 Low back pain, unspecified: Secondary | ICD-10-CM | POA: Diagnosis not present

## 2020-04-25 DIAGNOSIS — Z85118 Personal history of other malignant neoplasm of bronchus and lung: Secondary | ICD-10-CM

## 2020-04-25 DIAGNOSIS — I2510 Atherosclerotic heart disease of native coronary artery without angina pectoris: Secondary | ICD-10-CM | POA: Diagnosis not present

## 2020-04-25 DIAGNOSIS — G620 Drug-induced polyneuropathy: Secondary | ICD-10-CM | POA: Diagnosis not present

## 2020-04-25 DIAGNOSIS — T451X5A Adverse effect of antineoplastic and immunosuppressive drugs, initial encounter: Secondary | ICD-10-CM | POA: Diagnosis present

## 2020-04-25 DIAGNOSIS — D539 Nutritional anemia, unspecified: Secondary | ICD-10-CM | POA: Diagnosis not present

## 2020-04-25 DIAGNOSIS — J9383 Other pneumothorax: Principal | ICD-10-CM | POA: Diagnosis present

## 2020-04-25 DIAGNOSIS — K219 Gastro-esophageal reflux disease without esophagitis: Secondary | ICD-10-CM | POA: Diagnosis not present

## 2020-04-25 DIAGNOSIS — R7303 Prediabetes: Secondary | ICD-10-CM | POA: Diagnosis not present

## 2020-04-25 DIAGNOSIS — G250 Essential tremor: Secondary | ICD-10-CM | POA: Diagnosis not present

## 2020-04-25 DIAGNOSIS — Z8551 Personal history of malignant neoplasm of bladder: Secondary | ICD-10-CM

## 2020-04-25 DIAGNOSIS — Z8249 Family history of ischemic heart disease and other diseases of the circulatory system: Secondary | ICD-10-CM

## 2020-04-25 DIAGNOSIS — E785 Hyperlipidemia, unspecified: Secondary | ICD-10-CM | POA: Diagnosis present

## 2020-04-25 DIAGNOSIS — I4510 Unspecified right bundle-branch block: Secondary | ICD-10-CM | POA: Diagnosis present

## 2020-04-25 DIAGNOSIS — H3530 Unspecified macular degeneration: Secondary | ICD-10-CM | POA: Diagnosis present

## 2020-04-25 DIAGNOSIS — J942 Hemothorax: Secondary | ICD-10-CM | POA: Diagnosis not present

## 2020-04-25 DIAGNOSIS — Z9981 Dependence on supplemental oxygen: Secondary | ICD-10-CM | POA: Diagnosis not present

## 2020-04-25 DIAGNOSIS — Z86010 Personal history of colonic polyps: Secondary | ICD-10-CM

## 2020-04-25 DIAGNOSIS — Z20822 Contact with and (suspected) exposure to covid-19: Secondary | ICD-10-CM | POA: Diagnosis present

## 2020-04-25 DIAGNOSIS — G894 Chronic pain syndrome: Secondary | ICD-10-CM | POA: Diagnosis present

## 2020-04-25 DIAGNOSIS — S270XXD Traumatic pneumothorax, subsequent encounter: Secondary | ICD-10-CM

## 2020-04-25 DIAGNOSIS — C3411 Malignant neoplasm of upper lobe, right bronchus or lung: Secondary | ICD-10-CM | POA: Diagnosis not present

## 2020-04-25 DIAGNOSIS — D701 Agranulocytosis secondary to cancer chemotherapy: Secondary | ICD-10-CM | POA: Diagnosis not present

## 2020-04-25 DIAGNOSIS — Z87891 Personal history of nicotine dependence: Secondary | ICD-10-CM

## 2020-04-25 DIAGNOSIS — I10 Essential (primary) hypertension: Secondary | ICD-10-CM | POA: Diagnosis present

## 2020-04-25 DIAGNOSIS — J939 Pneumothorax, unspecified: Secondary | ICD-10-CM | POA: Diagnosis not present

## 2020-04-25 DIAGNOSIS — Z923 Personal history of irradiation: Secondary | ICD-10-CM

## 2020-04-25 DIAGNOSIS — J9622 Acute and chronic respiratory failure with hypercapnia: Secondary | ICD-10-CM | POA: Diagnosis present

## 2020-04-25 DIAGNOSIS — J441 Chronic obstructive pulmonary disease with (acute) exacerbation: Secondary | ICD-10-CM | POA: Diagnosis not present

## 2020-04-25 DIAGNOSIS — N400 Enlarged prostate without lower urinary tract symptoms: Secondary | ICD-10-CM | POA: Diagnosis present

## 2020-04-25 DIAGNOSIS — Z87442 Personal history of urinary calculi: Secondary | ICD-10-CM

## 2020-04-25 DIAGNOSIS — Z8052 Family history of malignant neoplasm of bladder: Secondary | ICD-10-CM

## 2020-04-25 DIAGNOSIS — Z7982 Long term (current) use of aspirin: Secondary | ICD-10-CM

## 2020-04-25 DIAGNOSIS — Z79899 Other long term (current) drug therapy: Secondary | ICD-10-CM

## 2020-04-25 DIAGNOSIS — J439 Emphysema, unspecified: Secondary | ICD-10-CM | POA: Diagnosis present

## 2020-04-25 DIAGNOSIS — R0602 Shortness of breath: Secondary | ICD-10-CM | POA: Diagnosis not present

## 2020-04-25 DIAGNOSIS — J9621 Acute and chronic respiratory failure with hypoxia: Secondary | ICD-10-CM | POA: Diagnosis present

## 2020-04-25 DIAGNOSIS — F1011 Alcohol abuse, in remission: Secondary | ICD-10-CM | POA: Diagnosis present

## 2020-04-25 DIAGNOSIS — R32 Unspecified urinary incontinence: Secondary | ICD-10-CM | POA: Diagnosis not present

## 2020-04-25 NOTE — ED Triage Notes (Signed)
Pt to ED from home c/o SOB since Sunday.  States was here 10 days ago with right collapsed lung.  Pt reports severe exertional dyspnea.  Denies cough, fever, n/v/d.  Speaking in complete and coherent sentence, pt on 4L [place] chronically for COPD.

## 2020-04-26 ENCOUNTER — Ambulatory Visit: Payer: Medicare Other

## 2020-04-26 ENCOUNTER — Encounter: Payer: Self-pay | Admitting: Internal Medicine

## 2020-04-26 ENCOUNTER — Inpatient Hospital Stay
Admission: EM | Admit: 2020-04-26 | Discharge: 2020-05-03 | DRG: 166 | Disposition: A | Discharge status: Home Health | Payer: Medicare Other | Attending: Internal Medicine | Admitting: Internal Medicine

## 2020-04-26 ENCOUNTER — Observation Stay: Payer: Medicare Other

## 2020-04-26 ENCOUNTER — Inpatient Hospital Stay: Payer: Medicare Other | Admitting: Cardiothoracic Surgery

## 2020-04-26 DIAGNOSIS — I10 Essential (primary) hypertension: Secondary | ICD-10-CM | POA: Diagnosis not present

## 2020-04-26 DIAGNOSIS — Z20822 Contact with and (suspected) exposure to covid-19: Secondary | ICD-10-CM | POA: Diagnosis present

## 2020-04-26 DIAGNOSIS — I2510 Atherosclerotic heart disease of native coronary artery without angina pectoris: Secondary | ICD-10-CM | POA: Diagnosis not present

## 2020-04-26 DIAGNOSIS — H3530 Unspecified macular degeneration: Secondary | ICD-10-CM | POA: Diagnosis present

## 2020-04-26 DIAGNOSIS — Z85118 Personal history of other malignant neoplasm of bronchus and lung: Secondary | ICD-10-CM | POA: Diagnosis not present

## 2020-04-26 DIAGNOSIS — Z87442 Personal history of urinary calculi: Secondary | ICD-10-CM | POA: Diagnosis not present

## 2020-04-26 DIAGNOSIS — J9611 Chronic respiratory failure with hypoxia: Secondary | ICD-10-CM | POA: Insufficient documentation

## 2020-04-26 DIAGNOSIS — R0600 Dyspnea, unspecified: Secondary | ICD-10-CM | POA: Diagnosis not present

## 2020-04-26 DIAGNOSIS — R059 Cough, unspecified: Secondary | ICD-10-CM

## 2020-04-26 DIAGNOSIS — J439 Emphysema, unspecified: Secondary | ICD-10-CM | POA: Diagnosis not present

## 2020-04-26 DIAGNOSIS — R0602 Shortness of breath: Secondary | ICD-10-CM | POA: Diagnosis not present

## 2020-04-26 DIAGNOSIS — G8929 Other chronic pain: Secondary | ICD-10-CM | POA: Diagnosis present

## 2020-04-26 DIAGNOSIS — J9383 Other pneumothorax: Secondary | ICD-10-CM

## 2020-04-26 DIAGNOSIS — R0609 Other forms of dyspnea: Secondary | ICD-10-CM

## 2020-04-26 DIAGNOSIS — J939 Pneumothorax, unspecified: Secondary | ICD-10-CM

## 2020-04-26 DIAGNOSIS — J449 Chronic obstructive pulmonary disease, unspecified: Secondary | ICD-10-CM

## 2020-04-26 DIAGNOSIS — J9622 Acute and chronic respiratory failure with hypercapnia: Secondary | ICD-10-CM | POA: Diagnosis present

## 2020-04-26 DIAGNOSIS — J9621 Acute and chronic respiratory failure with hypoxia: Secondary | ICD-10-CM | POA: Diagnosis not present

## 2020-04-26 DIAGNOSIS — D701 Agranulocytosis secondary to cancer chemotherapy: Secondary | ICD-10-CM | POA: Diagnosis present

## 2020-04-26 DIAGNOSIS — C3411 Malignant neoplasm of upper lobe, right bronchus or lung: Secondary | ICD-10-CM | POA: Diagnosis present

## 2020-04-26 DIAGNOSIS — M545 Low back pain, unspecified: Secondary | ICD-10-CM | POA: Diagnosis present

## 2020-04-26 DIAGNOSIS — J9311 Primary spontaneous pneumothorax: Secondary | ICD-10-CM

## 2020-04-26 DIAGNOSIS — D539 Nutritional anemia, unspecified: Secondary | ICD-10-CM | POA: Diagnosis present

## 2020-04-26 DIAGNOSIS — J95811 Postprocedural pneumothorax: Secondary | ICD-10-CM

## 2020-04-26 DIAGNOSIS — K219 Gastro-esophageal reflux disease without esophagitis: Secondary | ICD-10-CM | POA: Diagnosis present

## 2020-04-26 DIAGNOSIS — Z09 Encounter for follow-up examination after completed treatment for conditions other than malignant neoplasm: Secondary | ICD-10-CM

## 2020-04-26 DIAGNOSIS — J90 Pleural effusion, not elsewhere classified: Secondary | ICD-10-CM | POA: Diagnosis not present

## 2020-04-26 DIAGNOSIS — R918 Other nonspecific abnormal finding of lung field: Secondary | ICD-10-CM | POA: Diagnosis not present

## 2020-04-26 DIAGNOSIS — R7303 Prediabetes: Secondary | ICD-10-CM | POA: Diagnosis present

## 2020-04-26 DIAGNOSIS — I4510 Unspecified right bundle-branch block: Secondary | ICD-10-CM | POA: Diagnosis present

## 2020-04-26 DIAGNOSIS — Z9981 Dependence on supplemental oxygen: Secondary | ICD-10-CM | POA: Diagnosis not present

## 2020-04-26 DIAGNOSIS — G250 Essential tremor: Secondary | ICD-10-CM | POA: Diagnosis present

## 2020-04-26 DIAGNOSIS — G620 Drug-induced polyneuropathy: Secondary | ICD-10-CM | POA: Diagnosis present

## 2020-04-26 DIAGNOSIS — N400 Enlarged prostate without lower urinary tract symptoms: Secondary | ICD-10-CM | POA: Diagnosis present

## 2020-04-26 DIAGNOSIS — G894 Chronic pain syndrome: Secondary | ICD-10-CM | POA: Diagnosis present

## 2020-04-26 DIAGNOSIS — J9381 Chronic pneumothorax: Secondary | ICD-10-CM | POA: Diagnosis not present

## 2020-04-26 DIAGNOSIS — F1011 Alcohol abuse, in remission: Secondary | ICD-10-CM | POA: Diagnosis present

## 2020-04-26 DIAGNOSIS — T451X5A Adverse effect of antineoplastic and immunosuppressive drugs, initial encounter: Secondary | ICD-10-CM | POA: Diagnosis present

## 2020-04-26 DIAGNOSIS — E785 Hyperlipidemia, unspecified: Secondary | ICD-10-CM | POA: Diagnosis not present

## 2020-04-26 LAB — BASIC METABOLIC PANEL
Anion gap: 10 (ref 5–15)
BUN: 25 mg/dL — ABNORMAL HIGH (ref 8–23)
CO2: 30 mmol/L (ref 22–32)
Calcium: 8.2 mg/dL — ABNORMAL LOW (ref 8.9–10.3)
Chloride: 101 mmol/L (ref 98–111)
Creatinine, Ser: 0.53 mg/dL — ABNORMAL LOW (ref 0.61–1.24)
GFR, Estimated: >60 mL/min (ref >60)
Glucose, Bld: 145 mg/dL — ABNORMAL HIGH (ref 70–99)
Potassium: 4.3 mmol/L (ref 3.5–5.1)
Sodium: 141 mmol/L (ref 135–145)

## 2020-04-26 LAB — CBC
HCT: 38.5 % — ABNORMAL LOW (ref 39.0–52.0)
Hemoglobin: 12.1 g/dL — ABNORMAL LOW (ref 13.0–17.0)
MCH: 31.6 pg (ref 26.0–34.0)
MCHC: 31.4 g/dL (ref 30.0–36.0)
MCV: 100.5 fL — ABNORMAL HIGH (ref 80.0–100.0)
Platelets: 208 10*3/uL (ref 150–400)
RBC: 3.83 MIL/uL — ABNORMAL LOW (ref 4.22–5.81)
RDW: 13.2 % (ref 11.5–15.5)
WBC: 8.1 10*3/uL (ref 4.0–10.5)
nRBC: 0 % (ref 0.0–0.2)

## 2020-04-26 LAB — RESPIRATORY PANEL BY RT PCR (FLU A&B, COVID)
Influenza A by PCR: NEGATIVE
Influenza B by PCR: NEGATIVE
SARS Coronavirus 2 by RT PCR: NEGATIVE

## 2020-04-26 LAB — TROPONIN I (HIGH SENSITIVITY)
Troponin I (High Sensitivity): 18 ng/L — ABNORMAL HIGH (ref <18)
Troponin I (High Sensitivity): 21 ng/L — ABNORMAL HIGH (ref <18)

## 2020-04-26 MED ORDER — PRIMIDONE 50 MG PO TABS
150.0000 mg | ORAL_TABLET | Freq: Two times a day (BID) | ORAL | Status: DC
  Administered 2020-04-26 – 2020-05-03 (×12): 150 mg via ORAL
  Filled 2020-04-26 (×16): qty 3

## 2020-04-26 MED ORDER — ALBUTEROL SULFATE HFA 108 (90 BASE) MCG/ACT IN AERS
2.0000 | INHALATION_SPRAY | Freq: Four times a day (QID) | RESPIRATORY_TRACT | Status: DC | PRN
  Filled 2020-04-26: qty 6.7

## 2020-04-26 MED ORDER — GABAPENTIN 600 MG PO TABS
600.0000 mg | ORAL_TABLET | Freq: Three times a day (TID) | ORAL | Status: DC
  Administered 2020-04-26 – 2020-05-03 (×20): 600 mg via ORAL
  Filled 2020-04-26 (×20): qty 1

## 2020-04-26 MED ORDER — GABAPENTIN 300 MG PO CAPS
300.0000 mg | ORAL_CAPSULE | Freq: Three times a day (TID) | ORAL | Status: DC
  Administered 2020-04-26 – 2020-05-03 (×20): 300 mg via ORAL
  Filled 2020-04-26 (×20): qty 1

## 2020-04-26 MED ORDER — MIDAZOLAM HCL 2 MG/2ML IJ SOLN
INTRAMUSCULAR | Status: AC
  Filled 2020-04-26: qty 2

## 2020-04-26 MED ORDER — OXYCODONE HCL 5 MG PO TABS
10.0000 mg | ORAL_TABLET | Freq: Once | ORAL | Status: AC
  Administered 2020-04-26: 10 mg via ORAL
  Filled 2020-04-26: qty 2

## 2020-04-26 MED ORDER — MORPHINE SULFATE (PF) 2 MG/ML IV SOLN: 2.0000 mg | INTRAVENOUS | Status: DC | PRN

## 2020-04-26 MED ORDER — FENTANYL CITRATE (PF) 100 MCG/2ML IJ SOLN
INTRAMUSCULAR | Status: AC
  Filled 2020-04-26: qty 2

## 2020-04-26 MED ORDER — PANTOPRAZOLE SODIUM 40 MG PO TBEC
40.0000 mg | DELAYED_RELEASE_TABLET | Freq: Every day | ORAL | Status: DC
  Administered 2020-04-26 – 2020-05-03 (×8): 40 mg via ORAL
  Filled 2020-04-26 (×8): qty 1

## 2020-04-26 MED ORDER — ONDANSETRON HCL 4 MG/2ML IJ SOLN: 4.0000 mg | Freq: Four times a day (QID) | INTRAMUSCULAR | Status: DC | PRN

## 2020-04-26 MED ORDER — ROSUVASTATIN CALCIUM 10 MG PO TABS
10.0000 mg | ORAL_TABLET | Freq: Every day | ORAL | Status: DC
  Administered 2020-04-26 – 2020-05-02 (×7): 10 mg via ORAL
  Filled 2020-04-26 (×7): qty 1

## 2020-04-26 MED ORDER — GABAPENTIN 600 MG PO TABS: 600.0000 mg | ORAL_TABLET | Freq: Three times a day (TID) | ORAL | Status: DC

## 2020-04-26 MED ORDER — GABAPENTIN 300 MG PO CAPS: 300.0000 mg | ORAL_CAPSULE | Freq: Three times a day (TID) | ORAL | Status: DC

## 2020-04-26 MED ORDER — HYDROCODONE-ACETAMINOPHEN 10-325 MG PO TABS
1.0000 | ORAL_TABLET | Freq: Four times a day (QID) | ORAL | Status: DC | PRN
  Administered 2020-04-26 – 2020-05-01 (×5): 1 via ORAL
  Filled 2020-04-26 (×5): qty 1

## 2020-04-26 MED ORDER — ACETAMINOPHEN 325 MG PO TABS: 650.0000 mg | ORAL_TABLET | Freq: Four times a day (QID) | ORAL | Status: DC | PRN

## 2020-04-26 MED ORDER — HYDROCODONE-ACETAMINOPHEN 10-325 MG PO TABS: 1.0000 | ORAL_TABLET | Freq: Four times a day (QID) | ORAL | Status: DC | PRN

## 2020-04-26 MED ORDER — ACETAMINOPHEN 500 MG PO TABS
1000.0000 mg | ORAL_TABLET | Freq: Once | ORAL | Status: AC
  Administered 2020-04-26: 1000 mg via ORAL
  Filled 2020-04-26: qty 2

## 2020-04-26 MED ORDER — HEPARIN SODIUM (PORCINE) 5000 UNIT/ML IJ SOLN: 5000.0000 [IU] | Freq: Three times a day (TID) | INTRAMUSCULAR | Status: DC

## 2020-04-26 MED ORDER — ONDANSETRON HCL 4 MG PO TABS: 4.0000 mg | ORAL_TABLET | Freq: Four times a day (QID) | ORAL | Status: DC | PRN

## 2020-04-26 MED ORDER — POLYETHYLENE GLYCOL 3350 17 G PO PACK
17.0000 g | PACK | Freq: Every day | ORAL | Status: DC
  Administered 2020-05-03: 17 g via ORAL
  Filled 2020-04-26 (×5): qty 1

## 2020-04-26 MED ORDER — ACETAMINOPHEN 650 MG RE SUPP: 650.0000 mg | Freq: Four times a day (QID) | RECTAL | Status: DC | PRN

## 2020-04-26 NOTE — Progress Notes (Signed)
   Follow Up Note  Pt admitted earlier this morning.  Please see full H&P done earlier this a.m. for full history, assessment and plan.  Briefly 79 year old male with history of right-sided lung cancer s/p chemotherapy currently on radiation therapy, COPD/bullous emphysema on chronic home O2 at 2 L, previous history of pneumothorax, recently hospitalized from 11/5-11/8 with large pneumothorax presents to the ER complaining of SOB.  In the ED, patient was noted to require about 4 L of O2, labs unremarkable, chest x-ray showed recurrent right pneumothorax.  Patient admitted for further management   Today, patient still reports shortness of breath, denies any chest pain, abdominal pain, nausea/vomiting, fever/chills.  Patient noted to be in mild distress, able to talk in full sentences with some difficulty noted  Exam: CV: S1, S2 present Lungs: Diminished breath sounds on the right Abd: Soft, nontender, nondistended, bowel sounds present Ext: No bilateral lower extremity pedal edema noted  Present on Admission: . Chronic low back pain (Primary area of Pain) (Bilateral)  (L>R) w/o sciatica . HTN (hypertension) . Coronary artery disease involving native coronary artery of native heart without angina pectoris . Malignant neoplasm of right upper lobe of lung ([place]) . Pneumothorax   Acute on chronic hypoxic respiratory failure Recurrent right pneumothorax Currently on 4 L of O2, at baseline 2 L, noted mild distress, able to talk in full sentences, with some difficulty noted IR consulted, s/p CT-guided chest tube placement done by Dr. Anselm Pancoast on 04/26/2020 CTS Dr. Genevive Bi consulted, plan for thoracoscopic talc pleurodesis on 04/27/2020 Continue supplemental oxygen Monitor closely

## 2020-04-26 NOTE — Progress Notes (Signed)
Time out 1349

## 2020-04-26 NOTE — Consult Note (Signed)
Chief Complaint: Patient was seen in consultation today for right pigtail chest tube placement.  Referring Physician(s): [name]  Supervising Physician: Markus Daft  Patient Status: Joseph Graham  History of Present Illness: Joseph Graham is a 79 y.o. male with a past medical history significant for arthritis, BPH, CAD, HLD, HTN, bladder cancer, COPD and right sided lung cancer who presented to the ED last night with complaints of dyspnea. He was recently admitted from 11/5-11/8 after experiencing dyspnea as a result of a right sided pneumothorax, a right sided pigtail chest tube was placed on 11/6 in IR and subsequently removed by CT surgery on 11/8. CXR in the ED showed recurrent right pneumothorax and CT surgery was consulted who has asked IR to place a new right pigtail chest tube.  Joseph Graham seen in his room today, he continues to have dyspnea which is about the same as when he arrived. He denies any chest pain. He states he was told he would get a chest tube today to feel better and then Dr. Genevive Bi would "put the more permanent one in." He understands the procedure today and is agreeable to proceed as planned.  Past Medical History:  Diagnosis Date  . Alcohol abuse, in remission    remote   . Arthritis    BACK AND NECK  . Benign essential tremor    improved on metoprolol and gabapentin  . BPH (benign prostatic hypertrophy) 12/30/2012  . Cardiac arrhythmia 03/2010   h/o a flutter and CM, s/p ablation, normal stress test  . Cataract    bilat removed   . Chemotherapy-induced neutropenia ([place]) 09/23/2015  . Chronic low back pain    MRI 2004, multilevel DDD  . Colon polyps    next colonoscopy due around 2018  . Complication of anesthesia age 37-23   DID NOT GET COMPLETELY NUMB WITH TONSILLECTOMY  . COPD (chronic obstructive pulmonary disease) ([place])   . Coronary artery disease   . Dyslipidemia    mild off meds  . GERD (gastroesophageal reflux disease)   . History of  kidney stones    h/o  . HTN (hypertension)   . Macular degeneration, bilateral   . Multiple pulmonary nodules 02/2015  . Neuromuscular disorder ([place])    neuropathy from chemo....in feet  . Peripheral neuropathy due to chemotherapy ([place]) 12/23/2015   Saw Dr Manuella Ghazi, on gabapentin and cymbalta with hydrocodone PRN (03/2016)  . Personal history of tobacco use, presenting hazards to health 02/18/2015   quit 06/2011, restarted 2014  . Pneumothorax after biopsy 04/01/2015  . Pre-diabetes   . Primary cancer of bladder ([place]) 2016   [place]  . Primary lung cancer ([place]) 2016   port a cath removed 03/2016  . Shortness of breath dyspnea    OCC WITH EXERTION    Past Surgical History:  Procedure Laterality Date  . A FLUTTER ABLATION  03/2010  . carotid US  03/2010   WNL  . CATARACT EXTRACTION    . COLONOSCOPY  09/2011   polyps, rpt due 5 yrs, mild diverticulosis [name])  . CYSTOSCOPY W/ RETROGRADES Bilateral 04/06/2015   Procedure: CYSTOSCOPY WITH RETROGRADE PYELOGRAM;  Surgeon: [name], MD;  Location: [facility] ORS;  Service: Urology;  Laterality: Bilateral;  . ELECTROMAGNETIC NAVIGATION BROCHOSCOPY N/A 03/02/2015   Procedure: ELECTROMAGNETIC NAVIGATION BRONCHOSCOPY;  Surgeon: [name], MD;  Location: [facility] OR[facility];  Service: Cardiopulmonary;  Laterality: N/A;  . HEMI-MICRODISCECTOMY LUMBAR LAMINECTOMY LEVEL 1 N/A 01/26/2019   LEFT L5-S1 FAR LATERAL  APPROACH 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 [name], [name], MD)  . HEMORROIDECTOMY  1982  . LUNG BIOPSY     collapsed lung with chest tube  . POLYPECTOMY    . PORT-A-CATH REMOVAL Right 04/03/2016   Procedure: REMOVAL PORT-A-CATH;  Surgeon: [name], MD;  Location: [facility];  Service: General;  Laterality: Right;  . PORTACATH PLACEMENT N/A 08/10/2015   Procedure: INSERTION PORT-A-CATH;  Surgeon: [name], MD;  Location: [facility];  Service: General;  Laterality: N/A;  . [place]  . TRANSURETHRAL RESECTION OF  BLADDER TUMOR N/A 04/06/2015   Procedure: TRANSURETHRAL RESECTION OF BLADDER TUMOR (TURBT) right ureteral stent placement;  Surgeon: [name], MD;  Location: [facility];  Service: Urology;  Laterality: N/A;  . TRANSURETHRAL RESECTION OF BLADDER TUMOR N/A 07/13/2015   Procedure: TRANSURETHRAL RESECTION OF BLADDER TUMOR (TURBT);  Surgeon: [name], MD;  Location: [facility];  Service: Urology;  Laterality: N/A;  . urinary stent removal  04/14/15    Allergies: Patient has no known allergies.  Medications: Prior to Admission medications   Medication Sig Start Date End Date Taking? Authorizing Provider  albuterol (PROAIR HFA) 108 (90 Base) MCG/ACT inhaler Inhale 2 puffs into the lungs every 6 (six) hours as needed for wheezing or shortness of breath. 01/14/20  Yes [name], MD  ASPIRIN 81 PO Take 1 tablet by mouth daily.   Yes [provider]  Cholecalciferol (VITAMIN D-3) 125 MCG (5000 UT) TABS Take 5,000 Units by mouth daily.   Yes [provider]  gabapentin (NEURONTIN) 300 MG capsule Take 300 mg by mouth 3 (three) times daily. (Take with 600 mg for total of 900 mg three times daily) 10/06/19  Yes [provider]  gabapentin (NEURONTIN) 600 MG tablet Take 600 mg by mouth 3 (three) times daily. (Take with 300 mg for total of 900 mg three times daily) 03/15/20 03/15/21 Yes [provider]  HYDROcodone-acetaminophen (NORCO) 10-325 MG tablet Take 1 tablet by mouth every 6 (six) hours as needed for moderate pain or severe pain. 04/20/20  Yes Borders, Kirt Boys, NP  lisinopril (ZESTRIL) 10 MG tablet TAKE 1 TABLET BY MOUTH EVERY EVENING Patient taking differently: Take 10 mg by mouth every evening.  10/02/19  Yes [name], MD  metoprolol tartrate (LOPRESSOR) 25 MG tablet TAKE 1/2 TABLET BY MOUTH TWICE DAILY Patient taking differently: Take 12.5 mg by mouth 2 (two) times daily.  10/02/19  Yes [name], MD  Multiple Vitamin (MULTIVITAMIN  WITH MINERALS) TABS tablet Take 1 tablet by mouth every evening.   Yes [provider]  NON FORMULARY Take 500 mg by mouth daily. Hemp Oil 500 mg   Yes [provider]  omeprazole (PRILOSEC) 20 MG capsule Take 20 mg by mouth every morning.    Yes [provider]  oxymetazoline (AFRIN) 0.05 % nasal spray Place 1 spray into both nostrils 2 (two) times daily.   Yes [provider]  polyethylene glycol (MIRALAX / GLYCOLAX) packet Take 17 g by mouth daily.   Yes [provider]  primidone (MYSOLINE) 50 MG tablet Take 150 mg by mouth 2 (two) times daily.   Yes [provider]  rosuvastatin (CRESTOR) 10 MG tablet Take 1 tablet (10 mg total) by mouth daily. 01/14/20  Yes [name], MD  vitamin B-12 (CYANOCOBALAMIN) 100 MCG tablet Take 100 mcg by mouth every evening.   Yes [provider]  vitamin E 400 UNIT capsule Take 400 Units  by mouth every evening.    Yes [provider]  fluticasone (FLONASE) 50 MCG/ACT nasal spray Place 2 sprays into both nostrils daily. Patient not taking: Reported on 04/16/2020 01/14/20   [name], MD  naloxone [facility]) nasal spray 4 mg/0.1 mL 1 spray into nostril upon signs of opioid overdose. Call 911. May repeat once if no response within 2-3 minutes. 04/14/20   Borders, Kirt Boys, NP  OXYGEN Inhale 2 L into the lungs daily.    [provider]     Family History  Problem Relation Age of Onset  . Cervical cancer Mother 47  . Hypertension Father   . Tremor Father        and several uncles/aunts (not parkinson's)  . Stroke Brother 64  . Bladder Cancer Brother 79  . Prostate cancer Neg Hx   . Kidney cancer Neg Hx   . Colon cancer Neg Hx   . Colon polyps Neg Hx   . Esophageal cancer Neg Hx   . Rectal cancer Neg Hx     Social History   Socioeconomic History  . Marital status: Married    Spouse name: Not on file  . Number of children: Not on file  . Years of education: Not on  file  . Highest education level: Not on file  Occupational History  . Not on file  Tobacco Use  . Smoking status: Former Smoker    Packs/day: 1.50    Years: 61.00    Pack years: 91.50    Types: Cigarettes    Quit date: 03/08/2015    Years since quitting: 5.1  . Smokeless tobacco: Never Used  . Tobacco comment: cut back 0.5 cigarettes--stopped 03/07/15; now now chantix  Vaping Use  . Vaping Use: Every day  . Substances: Nicotine  Substance and Sexual Activity  . Alcohol use: No    Alcohol/week: 0.0 standard drinks    Comment: has not drank in 37 years  . Drug use: No  . Sexual activity: Never  Other Topics Concern  . Not on file  Social History Narrative   Caffeine: 1 cup decaf/day   Lives with wife   Occupation: retired, worked Press photographer   Activity: gym 3x/wk, Immunologist, hobbies (paint, building things)   Diet: healthy, good water, fruits/vegetables daily, red meat 1x/wk   Social Determinants of Radio broadcast assistant Strain: Low Risk   . Difficulty of Paying Living Expenses: Not hard at all  Food Insecurity: No Food Insecurity  . Worried About Charity fundraiser in the Last Year: Never true  . Ran Out of Food in the Last Year: Never true  Transportation Needs: No Transportation Needs  . Lack of Transportation (Medical): No  . Lack of Transportation (Non-Medical): No  Physical Activity: Inactive  . Days of Exercise per Week: 0 days  . Minutes of Exercise per Session: 0 min  Stress: No Stress Concern Present  . Feeling of Stress : Not at all  Social Connections:   . Frequency of Communication with Friends and Family: Not on file  . Frequency of Social Gatherings with Friends and Family: Not on file  . Attends Religious Services: Not on file  . Active Member of Clubs or Organizations: Not on file  . Attends Archivist Meetings: Not on file  . Marital Status: Not on file     Review of Systems: A 12 point ROS discussed and pertinent positives are  indicated in the HPI above.  All  other systems are negative.  Review of Systems  Constitutional: Negative for appetite change, chills and fever.  HENT:       (+) runny nose  Respiratory: Positive for shortness of breath. Negative for cough.   Cardiovascular: Negative for chest pain.  Gastrointestinal: Negative for abdominal pain, diarrhea, nausea and vomiting.  Musculoskeletal: Negative for back pain.  Neurological: Negative for headaches.    Vital Signs: BP (!) 152/60 (BP Location: Left Arm)   Pulse 73   Temp 97.6 F (36.4 C) (Oral)   Resp 17   Ht 5\' 8"  (1.727 m)   Wt 185 lb (83.9 kg)   SpO2 95%   BMI 28.13 kg/m   Physical Exam Vitals and nursing note reviewed.  Constitutional:      General: He is not in acute distress.    Comments: Pleasant, talkative, able to speak in full sentences with some difficulty.  HENT:     Head: Normocephalic.     Mouth/Throat:     Mouth: Mucous membranes are moist.     Pharynx: Oropharynx is clear. No oropharyngeal exudate or posterior oropharyngeal erythema.  Cardiovascular:     Rate and Rhythm: Normal rate and regular rhythm.  Pulmonary:     Effort: Pulmonary effort is normal.     Comments: (+) 4L O2 via [place] Decreased breath sounds right side Abdominal:     General: There is no distension.     Palpations: Abdomen is soft.     Tenderness: There is no abdominal tenderness.  Skin:    General: Skin is warm and dry.  Neurological:     Mental Status: He is alert and oriented to person, place, and time.  Psychiatric:        Mood and Affect: Mood normal.        Behavior: Behavior normal.        Thought Content: Thought content normal.        Judgment: Judgment normal.      MD Evaluation Airway: WNL Heart: WNL Abdomen: WNL Chest/ Lungs: WNL ASA  Classification: 3 Mallampati/Airway Score: One   Imaging: DG Chest 2 View  Result Date: 04/26/2020 CLINICAL DATA:  Shortness of breath EXAM: CHEST - 2 VIEW COMPARISON:  04/18/2020  FINDINGS: Recurrent right pneumothorax measuring approximately 18 mm at the lateral right chest. Left lung is clear. No mediastinal shift. Normal cardiomediastinal contours. IMPRESSION: Recurrent right pneumothorax, measuring approximately 18 mm. These results were called by telephone at the time of interpretation on 04/26/2020 at 12:41 am to provider [facility] , who verbally acknowledged these results. Electronically Signed   By: Ulyses Jarred M.D.   On: 04/26/2020 00:42   DG Chest 2 View  Result Date: 04/18/2020 CLINICAL DATA:  Recent pneumothorax EXAM: CHEST - 2 VIEW COMPARISON:  April 17, 2020 chest radiograph and chest CT April 16, 2020 FINDINGS: Chest tube present on the right anteriorly with trace apicolateral pneumothorax on the right. There is an ill-defined opacity in the right upper lobe with nearby surgical clips measuring 2.0 x 1.7 cm, essentially stable from recent studies. No edema or airspace opacity evident. Heart is upper normal in size with pulmonary vascularity normal. No adenopathy. There is degenerative change in the thoracic spine. IMPRESSION: Trace apicolateral pneumothorax remains on the right. Chest tube in place. Nodular opacity with apparent scarring and nearby surgical clips right upper lobe toward the apex medially. Neoplastic focus in this area questioned. No edema or consolidation. Stable cardiac silhouette. Electronically Signed   By: Gwyndolyn Saxon  Jasmine December III M.D.   On: 04/18/2020 08:06   CT CHEST WO CONTRAST  Result Date: 04/16/2020 CLINICAL DATA:  79 year old male with history of chest pain and worsening shortness of breath with exertion. History of right-sided pneumothorax. EXAM: CT CHEST WITHOUT CONTRAST TECHNIQUE: Multidetector CT imaging of the chest was performed following the standard protocol without IV contrast. COMPARISON:  Chest CT 02/24/2020.  Chest x-ray 04/15/2020. FINDINGS: Cardiovascular: Heart size is normal. There is no significant pericardial  fluid, thickening or pericardial calcification. There is aortic atherosclerosis, as well as atherosclerosis of the great vessels of the mediastinum and the coronary arteries, including calcified atherosclerotic plaque in the left main, left anterior descending, left circumflex and right coronary arteries. Severe calcifications of the aortic valve and mitral annulus. Aberrant right subclavian artery (normal anatomical variant) incidentally noted. Mediastinum/Nodes: No pathologically enlarged mediastinal or hilar lymph nodes. Please note that accurate exclusion of hilar adenopathy is limited on noncontrast CT scans. Esophagus is unremarkable in appearance. No axillary lymphadenopathy. Lungs/Pleura: Moderate to large right pneumothorax. Trace volume of dependent pleural fluid in the lower right hemithorax. Right lung is partially collapsed, but there continue to be multiple pulmonary nodules seen in the lungs bilaterally (right greater than left), largest of which is in the posterior aspect of the right lower lobe (axial image 90 of series 3) measuring 2.1 x 1.9 cm, and in the right upper lobe near the apex (axial image 25 of series 3) measuring 1.8 x 1.3 cm. No acute consolidative airspace disease. No left pleural effusion. Diffuse bronchial wall thickening with moderate centrilobular and paraseptal emphysema. Upper Abdomen: Aortic atherosclerosis. Musculoskeletal: There are no aggressive appearing lytic or blastic lesions noted in the visualized portions of the skeleton. IMPRESSION: 1. Right-sided hydropneumothorax with moderate to large pneumothorax component and trace volume of pleural fluid in the right hemithorax. Based on comparison with prior chest x-ray 04/15/2020, this hydropneumothorax appears likely increased in size. 2. Bilateral pulmonary nodules again noted, with slight enlargement compared to the prior study from 02/24/2020, as detailed above. 3. Diffuse bronchial wall thickening with moderate  centrilobular and paraseptal emphysema; imaging findings suggestive of underlying COPD. 4. Aortic atherosclerosis, in addition to left main and 3 vessel coronary artery disease. Assessment for potential risk factor modification, dietary therapy or pharmacologic therapy may be warranted, if clinically indicated. 5. There are calcifications of the aortic valve and mitral annulus. Echocardiographic correlation for evaluation of potential valvular dysfunction may be warranted if clinically indicated. Aortic Atherosclerosis (ICD10-I70.0) and Emphysema (ICD10-J43.9). Electronically Signed   By: Vinnie Langton M.D.   On: 04/16/2020 11:20   DG Chest Port 1 View  Result Date: 04/18/2020 CLINICAL DATA:  Chest tube placement for pneumothorax EXAM: PORTABLE CHEST 1 VIEW COMPARISON:  April 16, 2020 chest radiograph and chest CT FINDINGS: Chest tube placed on the right with minimal apicolateral pneumothorax on the right. Nodular opacity right upper lobe medially with nearby surgical clips is stable. No new opacity evident. Heart size and pulmonary vascular normal. No adenopathy. No bone lesions IMPRESSION: Minimal residual apicolateral pneumothorax on the right after chest tube placement. Nodular opacity medial right apex region persists. No new opacity evident. Stable cardiac silhouette. Electronically Signed   By: Lowella Grip III M.D.   On: 04/18/2020 08:07   DG Chest Port 1 View  Result Date: 04/16/2020 CLINICAL DATA:  Chest pain, shortness of breath, right pneumothorax EXAM: PORTABLE CHEST 1 VIEW COMPARISON:  04/15/2020 FINDINGS: Interval increase in the right pneumothorax with larger medial and basilar  components of pleural air. Background emphysema noted. Similar postop change and right apical scar-like nodular opacity. Medial left base atelectasis. No large effusion. Stable heart size. No mediastinal shift. Trachea midline. IMPRESSION: Interval increase in the right pneumothorax with larger medial and  basilar components Electronically Signed   By: Jerilynn Mages.  Shick M.D.   On: 04/16/2020 17:05   DG Chest Portable 1 View  Result Date: 04/15/2020 CLINICAL DATA:  Shortness of breath EXAM: PORTABLE CHEST 1 VIEW COMPARISON:  04/15/2020 FINDINGS: Right pneumothorax again noted, unchanged. Heart is normal size. No confluent airspace opacities. No acute bony abnormality. IMPRESSION: Stable right pneumothorax. Electronically Signed   By: Rolm Baptise M.D.   On: 04/15/2020 23:23   DG Chest Portable 1 View  Result Date: 04/15/2020 CLINICAL DATA:  Chest pain and shortness of breath, history of carcinoma of the lung EXAM: PORTABLE CHEST 1 VIEW COMPARISON:  03/15/2020 FINDINGS: Previously seen chest wall port is been removed in the interval. Apical mass with fiducial markers is noted on the right. The dominant nodule in the posterior aspect of the right lung base is not as well appreciated as on prior PET-CT. New right basilar pneumothorax is seen. This extends along the lateral aspect of the right chest. Left lung remains clear. IMPRESSION: New right-sided pneumothorax. This is prominently in the right base laterally although a component does extend superiorly near the apex. Changes in the right apex with fiducial markers are noted consistent with the given clinical history. The lower lobe nodule is not as well appreciated in part due to the pneumothorax but the overlying hemidiaphragm. Critical Value/emergent results were called by telephone at the time of interpretation on 04/15/2020 at 9:34 pm to Dr. Carrie Mew , who verbally acknowledged these results. Electronically Signed   By: Inez Catalina M.D.   On: 04/15/2020 21:37   CT IMAGE GUIDED DRAINAGE BY PERCUTANEOUS CATHETER  Result Date: 04/16/2020 INDICATION: Chest pain, shortness of breath, pneumothorax EXAM: CT-GUIDED 14 FRENCH RIGHT CHEST TUBE INSERTION MEDICATIONS: 1% LIDOCAINE LOCAL ANESTHESIA/SEDATION: Fentanyl 50 mcg IV; Versed 2.0 mg IV Moderate Sedation  Time:  11 MINUTES The patient was continuously monitored during the procedure by the interventional radiology nurse under my direct supervision. COMPLICATIONS: None. PROCEDURE: Informed written consent was obtained from the patient after a thorough discussion of the procedural risks, benefits and alternatives. All questions were addressed. Maximal Sterile Barrier Technique was utilized including caps, mask, sterile gowns, sterile gloves, sterile drape, hand hygiene and skin antiseptic. A timeout was performed prior to the initiation of the procedure. Previous imaging reviewed. Patient positioned supine. Noncontrast localization CT performed. The large right pneumothorax was localized. A lower intercostal space in the mid axillary line was marked for access. Under sterile conditions and local anesthesia, an 18 gauge 10 cm access needle was advanced under CT guidance into the right pleural space. Needle position confirmed with CT. Guidewire inserted followed by tract dilatation insert a 14 French drain. Drain catheter position confirmed with CT. Catheter connected to external pleura vac at 20 cm water low wall suction. Following this, repeat CT imaging demonstrates significant improvement in the pneumothorax with near complete right lung reinflation. Catheter secured with Prolene suture and connected to pleura vac at water seal. Sterile dressing applied. No immediate complication. Patient tolerated the procedure well. IMPRESSION: Successful CT-guided 14 French right chest tube insertion Electronically Signed   By: Jerilynn Mages.  Shick M.D.   On: 04/16/2020 16:58    Labs:  CBC: Recent Labs    02/24/20 7989  04/15/20 2139 04/16/20 0500 04/25/20 2354  WBC 6.4 9.0 5.9 8.1  HGB 12.6* 14.1 12.6* 12.1*  HCT 38.0* 45.0 39.4 38.5*  PLT 220 243 232 208    COAGS: Recent Labs    04/16/20 1245  INR 1.0  APTT 30    BMP: Recent Labs    08/12/19 1046 08/12/19 1046 02/24/20 0821 04/15/20 2139 04/16/20 0500  04/25/20 2354  NA 137   < > 138 137 138 141  K 4.6   < > 5.5* 4.3 4.4 4.3  CL 99   < > 98 94* 96* 101  CO2 29   < > 32 32 28 30  GLUCOSE 111*   < > 116* 140* 169* 145*  BUN 22   < > 20 22 23  25*  CALCIUM 8.9   < > 8.7* 9.3 9.3 8.2*  CREATININE 0.61   < > 0.69 0.58* 0.76 0.53*  GFRNONAA >60   < > >60 >60 >60 >60  GFRAA >60  --  >60  --   --   --    < > = values in this interval not displayed.    LIVER FUNCTION TESTS: Recent Labs    08/12/19 1046 02/24/20 0821 04/15/20 2139 04/16/20 0500  BILITOT 0.5 0.4 0.4 0.5  AST 19 24 26  33  ALT 17 21 19 16   ALKPHOS 93 115 144* 124  PROT 8.0 7.8 9.1* 8.2*  ALBUMIN 4.0 3.8 4.4 3.9    TUMOR MARKERS: No results for input(s): AFPTM, CEA, CA199, CHROMGRNA in the last 8760 hours.  Assessment and Plan:  79 y/o M with history of right sided lung cancer and recurrent right pneumothorax who presented to the ED last night with complaints of dyspnea, he was found to have a right sided pneumothorax and was admitted for further management. IR has been asked to place a right sided chest tube today - patient history and imaging have been reviewed by Dr. Anselm Pancoast today who agrees to procedure.  Per patient he has been NPO since before midnight, heparin SQ has been held. Afebrile, WBC 8.1, hgb 12.1, plt 208. Will need repeat COVID test prior to aerosolizing procedure - I have ordered this to be done this morning.   Risks and benefits discussed with the patient including bleeding, infection, damage to adjacent structures and sepsis.  All of the patient's questions were answered, patient is agreeable to proceed.  Consent signed and in chart.  Thank you for this interesting consult.  I greatly enjoyed meeting Joseph Graham and look forward to participating in their care.  A copy of this report was sent to the requesting provider on this date.  Electronically Signed: Joaquim Nam, PA-C 04/26/2020, 8:51 AM   I spent a total of 40 Minutes  in face  to face in clinical consultation, greater than 50% of which was counseling/coordinating care for right chest tube placement.

## 2020-04-26 NOTE — Procedures (Signed)
Interventional Radiology Procedure:   Indications: Recurrent right pneumothorax  Procedure: CT guided right chest tube placement  Findings: 16 Fr tube placed and pneumothorax significantly reduced in size.  Complications: None     EBL: less than 10 ml  Plan: Chest tube to wall suction.  Plan for Thoracic surgery tomorrow.    Joseph Graham R. Anselm Pancoast, MD  Pager: [phone]

## 2020-04-26 NOTE — Progress Notes (Signed)
Joseph Graham Follow Up Note  Patient ID: Joseph Graham, male   DOB: 1941-05-03, 79 y.o.   MRN: [id number]  HISTORY: This patient is well-known to [place].  He is a 79 year old gentleman who is now had 3 pneumothoraces on the right.  One was related to a diagnostic bronchoscopy several years ago but in the last 2 weeks he has had 2 pneumothoraces on the right prompting emergency room visits with hospital admissions.  The last time his pneumothorax was treated with a chest tube.  Ultimately his lung was expanded without an air leak and he was discharged home.  He came back in last evening with again complaints of shortness of breath and a moderate sized pneumothorax.  He states that he does feel short of breath now but he had just been out of bed to use the restroom.  He is not as short of breath as he was the last time he was here.    Vitals:   04/26/20 0300 04/26/20 0413  BP: (!) 120/49 (!) 152/60  Pulse: (!) 57 73  Resp: 14 17  Temp:  97.6 F (36.4 C)  SpO2: 96% 95%     EXAM:  Resp: Lungs are clear bilaterally.  No respiratory distress, normal effort. Heart:  Regular without murmurs Abd:  Abdomen is soft, non distended and non tender. No masses are palpable.  There is no rebound and no guarding.  Neurological: Alert and oriented to person, place, and time. Coordination normal.  Skin: Skin is warm and dry. No rash noted. No diaphoretic. No erythema. No pallor.  Psychiatric: Normal mood and affect. Normal behavior. Judgment and thought content normal.      ASSESSMENT: I have independently reviewed his chest x-ray.  There is a moderate sized pneumothorax predominantly at the base.   PLAN:   I had a long discussion with the patient regarding the indications and risks of the various treatment options for his recurrent pneumothorax.  We discussed the role of thoracoscopy versus tube insertion with pleurodesis and agents.  After extensive discussion regarding the indications and risks as well as  advantages and disadvantages he would like to undergo a thoracoscopic talc pleurodesis.  I explained to him the indications to include recurrent pneumothorax.  He understands that on the more recent CT scan some of the lung actually was adhered to the chest wall.  Risks of bleeding, infection, recurrent pneumothorax and death were all reviewed.  He would like to proceed with that.  We will order a image guided drainage procedure today for symptomatic management of his pneumothorax.  We will then perform a surgical procedure tomorrow.    Joseph Graham, MDPatient ID: Joseph Graham, male   DOB: 05/18/1941, 79 y.o.   MRN: [id number]

## 2020-04-26 NOTE — ED Notes (Signed)
Lights dimmed for pt comfort, watching tv at this time, denies needs

## 2020-04-26 NOTE — ED Notes (Signed)
Assumed care of pt upon being roomed. Assisted to ED litter from recliner. Reports "slight" sob. Hx of pneumothorax. Chronically on 2L [place], currently on 4L [place] with O2 sat 94-97%. Denies CP. BLE +2 edema. Pillow placed under knees. Warm blanket provided for comfort. Side rails up x2. Call bell and remote within reach. Pt has personal cell phone at bedside. Provider assessing pt at this time.

## 2020-04-26 NOTE — H&P (Signed)
History and Physical    KASCH BORQUEZ HBZ:[id number] DOB: July 14, 1940 DOA: 04/26/2020  PCP: [name], MD   Patient coming from: home  I have personally briefly reviewed patient's old medical records in [place]  Chief Complaint: Shortness of breath  HPI: Joseph Graham is a 79 y.o. male with medical history significant for right-sided lung cancer status post chemotherapy currently on radiation therapy, COPD/bullous emphysema on home O2 at 2 L, chronic back pain, benign essential tremor, coronary artery disease, peripheral neuropathy secondary to chemotherapy, previous history of pneumothorax, hospitalized from 11/5-11/8 with large pneumothorax treated with chest tube placement who presented to the ER with worsening shortness of breath similar to when he presented on 11/5.  Has a mild cough but denies fever or chills.  Denies chest pain.  Denies leg pain or swelling..  Patient is normally on 2 L of oxygen at home.  He is requiring up to 4 L today  ED Course: On arrival in the ER blood pressure was soft at 111/54 but vitals were otherwise normal with O2 sat 95% on O2 at 2 L.  O2 was taken up to 4 L after his O2 sat fell to 93 while on home flow rate.  His blood work was unremarkable.  Chest x-ray showed recurrent right pneumothorax, measuring approximately 18 mm EKG as reviewed by me : NSR rate of 93 with RBBB  Review of Systems: As per HPI otherwise all other systems on review of systems negative.    Past Medical History:  Diagnosis Date  . Alcohol abuse, in remission    remote   . Arthritis    BACK AND NECK  . Benign essential tremor    improved on metoprolol and gabapentin  . BPH (benign prostatic hypertrophy) 12/30/2012  . Cardiac arrhythmia 03/2010   h/o a flutter and CM, s/p ablation, normal stress test  . Cataract    bilat removed   . Chemotherapy-induced neutropenia ([place]) 09/23/2015  . Chronic low back pain    MRI 2004, multilevel DDD  . Colon polyps     next colonoscopy due around 2018  . Complication of anesthesia age [age]   DID NOT GET COMPLETELY NUMB WITH TONSILLECTOMY  . COPD (chronic obstructive pulmonary disease) ([place])   . Coronary artery disease   . Dyslipidemia    mild off meds  . GERD (gastroesophageal reflux disease)   . History of kidney stones    h/o  . HTN (hypertension)   . Macular degeneration, bilateral   . Multiple pulmonary nodules 02/2015  . Neuromuscular disorder ([place])    neuropathy from chemo....in feet  . Peripheral neuropathy due to chemotherapy ([place]) 12/23/2015   Saw Dr Manuella Ghazi, on gabapentin and cymbalta with hydrocodone PRN (03/2016)  . Personal history of tobacco use, presenting hazards to health 02/18/2015   quit 06/2011, restarted 2014  . Pneumothorax after biopsy 04/01/2015  . Pre-diabetes   . Primary cancer of bladder ([place]) 2016   [place]  . Primary lung cancer ([place]) 2016   port a cath removed 03/2016  . Shortness of breath dyspnea    OCC WITH EXERTION    Past Surgical History:  Procedure Laterality Date  . A FLUTTER ABLATION  03/2010  . carotid US  03/2010   WNL  . CATARACT EXTRACTION    . COLONOSCOPY  09/2011   polyps, rpt due 5 yrs, mild diverticulosis [name])  . CYSTOSCOPY W/ RETROGRADES Bilateral 04/06/2015   Procedure: CYSTOSCOPY WITH RETROGRADE PYELOGRAM;  Surgeon:  [name], MD;  Location: [facility] ORS;  Service: Urology;  Laterality: Bilateral;  . ELECTROMAGNETIC NAVIGATION BROCHOSCOPY N/A 03/02/2015   Procedure: ELECTROMAGNETIC NAVIGATION BRONCHOSCOPY;  Surgeon: [name], MD;  Location: [facility] ORS;  Service: Cardiopulmonary;  Laterality: N/A;  . 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 HEMORROIDECTOMY  1982  . LUNG BIOPSY     collapsed lung with chest tube  . POLYPECTOMY    . PORT-A-CATH REMOVAL Right 04/03/2016   Procedure: REMOVAL PORT-A-CATH;   Surgeon: [name], MD;  Location: [facility] OR[facility];  Service: General;  Laterality: Right;  . PORTACATH PLACEMENT N/A 08/10/2015   Procedure: INSERTION PORT-A-CATH;  Surgeon: [name], MD;  Location: [facility] OR[facility];  Service: General;  Laterality: N/A;  . [place]  . TRANSURETHRAL RESECTION OF BLADDER TUMOR N/A 04/06/2015   Procedure: TRANSURETHRAL RESECTION OF BLADDER TUMOR (TURBT) right ureteral stent placement;  Surgeon: [name], MD;  Location: [facility] OR[facility];  Service: Urology;  Laterality: N/A;  . TRANSURETHRAL RESECTION OF BLADDER TUMOR N/A 07/13/2015   Procedure: TRANSURETHRAL RESECTION OF BLADDER TUMOR (TURBT);  Surgeon: [name], MD;  Location: [facility] OR[facility];  Service: Urology;  Laterality: N/A;  . urinary stent removal  04/14/15     reports that he quit smoking about 5 years ago. His smoking use included cigarettes. He has a 91.50 pack-year smoking history. He has never used smokeless tobacco. He reports that he does not drink alcohol and does not use drugs.  No Known Allergies  Family History  Problem Relation Age of Onset  . Cervical cancer Mother 58  . Hypertension Father   . Tremor Father        and several uncles/aunts (not parkinson's)  . Stroke Brother 67  . Bladder Cancer Brother 29  . Prostate cancer Neg Hx   . Kidney cancer Neg Hx   . Colon cancer Neg Hx   . Colon polyps Neg Hx   . Esophageal cancer Neg Hx   . Rectal cancer Neg Hx       Prior to Admission medications   Medication Sig Start Date End Date Taking? Authorizing Provider  albuterol (PROAIR HFA) 108 (90 Base) MCG/ACT inhaler Inhale 2 puffs into the lungs every 6 (six) hours as needed for wheezing or shortness of breath. 01/14/20  Yes [name], MD  ASPIRIN 81 PO Take 1 tablet by mouth daily.   Yes [provider]  Cholecalciferol (VITAMIN D-3) 125 MCG (5000 UT) TABS Take 5,000 Units by mouth daily.   Yes [provider]  gabapentin (NEURONTIN) 300 MG capsule Take  300 mg by mouth 3 (three) times daily. (Take with 600 mg for total of 900 mg three times daily) 10/06/19  Yes [provider]  gabapentin (NEURONTIN) 600 MG tablet Take 600 mg by mouth 3 (three) times daily. (Take with 300 mg for total of 900 mg three times daily) 03/15/20 03/15/21 Yes [provider]  HYDROcodone-acetaminophen (NORCO) 10-325 MG tablet Take 1 tablet by mouth every 6 (six) hours as needed for moderate pain or severe pain. 04/20/20  Yes Borders, Kirt Boys, NP  lisinopril (ZESTRIL) 10 MG tablet TAKE 1 TABLET BY MOUTH EVERY EVENING Patient taking differently: Take 10 mg by mouth every evening.  10/02/19  Yes [name], MD  metoprolol tartrate (LOPRESSOR) 25 MG tablet TAKE 1/2 TABLET BY MOUTH TWICE DAILY Patient taking differently: Take  12.5 mg by mouth 2 (two) times daily.  10/02/19  Yes [name], MD  Multiple Vitamin (MULTIVITAMIN WITH MINERALS) TABS tablet Take 1 tablet by mouth every evening.   Yes [provider]  NON FORMULARY Take 500 mg by mouth daily. Hemp Oil 500 mg   Yes [provider]  omeprazole (PRILOSEC) 20 MG capsule Take 20 mg by mouth every morning.    Yes [provider]  oxymetazoline (AFRIN) 0.05 % nasal spray Place 1 spray into both nostrils 2 (two) times daily.   Yes [provider]  polyethylene glycol (MIRALAX / GLYCOLAX) packet Take 17 g by mouth daily.   Yes [provider]  primidone (MYSOLINE) 50 MG tablet Take 150 mg by mouth 2 (two) times daily.   Yes [provider]  rosuvastatin (CRESTOR) 10 MG tablet Take 1 tablet (10 mg total) by mouth daily. 01/14/20  Yes [name], MD  vitamin B-12 (CYANOCOBALAMIN) 100 MCG tablet Take 100 mcg by mouth every evening.   Yes [provider]  vitamin E 400 UNIT capsule Take 400 Units by mouth every evening.    Yes [provider]  fluticasone (FLONASE) 50 MCG/ACT nasal spray Place 2 sprays into both nostrils  daily. Patient not taking: Reported on 04/16/2020 01/14/20   [name], MD  naloxone [facility]) nasal spray 4 mg/0.1 mL 1 spray into nostril upon signs of opioid overdose. Call 911. May repeat once if no response within 2-3 minutes. 04/14/20   Borders, Kirt Boys, NP  OXYGEN Inhale 2 L into the lungs daily.    [provider]    Physical Exam: Vitals:   04/25/20 2349 04/25/20 2353 04/26/20 0053 04/26/20 0200  BP:  (!) 152/51 (!) 166/71 (!) 146/52  Pulse:  73 76 63  Resp:  18 (!) 23 16  Temp:  97.9 F (36.6 C)    TempSrc:  Oral    SpO2:  93% 94% 96%  Weight: 83.9 kg     Height: 5\' 8"  (1.727 m)        Vitals:   04/25/20 2349 04/25/20 2353 04/26/20 0053 04/26/20 0200  BP:  (!) 152/51 (!) 166/71 (!) 146/52  Pulse:  73 76 63  Resp:  18 (!) 23 16  Temp:  97.9 F (36.6 C)    TempSrc:  Oral    SpO2:  93% 94% 96%  Weight: 83.9 kg     Height: 5\' 8"  (1.727 m)         Constitutional: Alert and oriented x 3 .  Mild tachypnea but conversational dyspnea speaking in short sentences HEENT:      Head: Normocephalic and atraumatic.         Eyes: PERLA, EOMI, Conjunctivae are normal. Sclera is non-icteric.       Mouth/Throat: Mucous membranes are moist.       Neck: Supple with no signs of meningismus. Cardiovascular: Regular rate and rhythm. No murmurs, gallops, or rubs. 2+ symmetrical distal pulses are present . No JVD. No LE edema Respiratory: Respiratory effort increased.Lungs sounds diminished bilaterally.  Coarse breath sounds Gastrointestinal: Soft, non tender, and non distended with positive bowel sounds. No rebound or guarding. Genitourinary: No CVA tenderness. Musculoskeletal: Nontender with normal range of motion in all extremities. No cyanosis, or erythema of extremities. Neurologic:  Face is symmetric. Moving all extremities. No gross focal neurologic deficits . Skin: Skin is warm, dry.  No rash or ulcers Psychiatric: Mood and affect are normal    Labs  on  Admission: I have personally reviewed following labs and imaging studies  CBC: Recent Labs  Lab 04/25/20 2354  WBC 8.1  HGB 12.1*  HCT 38.5*  MCV 100.5*  PLT 403   Basic Metabolic Panel: Recent Labs  Lab 04/25/20 2354  NA 141  K 4.3  CL 101  CO2 30  GLUCOSE 145*  BUN 25*  CREATININE 0.53*  CALCIUM 8.2*   GFR: Estimated Creatinine Clearance: 79 mL/min (A) (by C-G formula based on SCr of 0.53 mg/dL (L)). Liver Function Tests: No results for input(s): AST, ALT, ALKPHOS, BILITOT, PROT, ALBUMIN in the last 168 hours. No results for input(s): LIPASE, AMYLASE in the last 168 hours. No results for input(s): AMMONIA in the last 168 hours. Coagulation Profile: No results for input(s): INR, PROTIME in the last 168 hours. Cardiac Enzymes: No results for input(s): CKTOTAL, CKMB, CKMBINDEX, TROPONINI in the last 168 hours. BNP (last 3 results) No results for input(s): PROBNP in the last 8760 hours. HbA1C: No results for input(s): HGBA1C in the last 72 hours. CBG: No results for input(s): GLUCAP in the last 168 hours. Lipid Profile: No results for input(s): CHOL, HDL, LDLCALC, TRIG, CHOLHDL, LDLDIRECT in the last 72 hours. Thyroid Function Tests: No results for input(s): TSH, T4TOTAL, FREET4, T3FREE, THYROIDAB in the last 72 hours. Anemia Panel: No results for input(s): VITAMINB12, FOLATE, FERRITIN, TIBC, IRON, RETICCTPCT in the last 72 hours. Urine analysis:    Component Value Date/Time   COLORURINE YELLOW (A) 01/22/2019 1212   APPEARANCEUR CLEAR (A) 01/22/2019 1212   APPEARANCEUR Clear 03/05/2018 1420   LABSPEC 1.017 01/22/2019 1212   PHURINE 6.0 01/22/2019 1212   GLUCOSEU NEGATIVE 01/22/2019 1212   HGBUR NEGATIVE 01/22/2019 1212   BILIRUBINUR NEGATIVE 01/22/2019 1212   BILIRUBINUR Negative 03/05/2018 1420   KETONESUR NEGATIVE 01/22/2019 1212   PROTEINUR NEGATIVE 01/22/2019 1212   NITRITE NEGATIVE 01/22/2019 1212   LEUKOCYTESUR NEGATIVE 01/22/2019 1212     Radiological Exams on Admission: DG Chest 2 View  Result Date: 04/26/2020 CLINICAL DATA:  Shortness of breath EXAM: CHEST - 2 VIEW COMPARISON:  04/18/2020 FINDINGS: Recurrent right pneumothorax measuring approximately 18 mm at the lateral right chest. Left lung is clear. No mediastinal shift. Normal cardiomediastinal contours. IMPRESSION: Recurrent right pneumothorax, measuring approximately 18 mm. These results were called by telephone at the time of interpretation on 04/26/2020 at 12:41 am to provider [facility] , who verbally acknowledged these results. Electronically Signed   By: Ulyses Jarred M.D.   On: 04/26/2020 00:42     Assessment/Plan 79 year old male with history of right-sided lung cancer status post chemotherapy currently on radiation therapy, COPD/bullous emphysema on home O2 at 2 L, coronary artery disease, peripheral neuropathy secondary to chemotherapy,hospitalized from 11/5-11/8 with large pneumothorax treated with chest tube placement who presented to the ER with worsening shortness of breath similar to when he presented on 11/5.    Recurrent pneumothorax after pleural drain removed   Mild acute worsening of chronic respiratory failure with hypoxia ([place]) -Patient presenting with shortness of breath but without tachypnea or increased work of breathing.  Requiring 4 L above home flow rate of 2L to keep sats  -Chest x-ray showed recurrent right pneumothorax measuring 18 mm -Surgical/CT consult surgery for additional recommendations regarding monitoring of pneumothorax- -Recommendations per surgeon Dr. Celine Ahr via secure chat; possible IR consult/CT consult surgery in the a.m. recommends just humidified oxygen as patient currently stable    COPD (chronic obstructive pulmonary disease) ([place])   Bullous emphysema (  [place]) -Not acutely exacerbated -Continue home inhalers -DuoNebs as needed    Malignant neoplasm of right upper lobe of lung ([place]) -No acute  issues -currently undergoing radiation therapy with Dr. Baruch Gouty for radiation.  Dr. Rogue Bussing is primary medical oncologist.      HTN (hypertension) -Blood pressure borderline low so we will hold lisinopril and Lopressor and resume as appropriate    Coronary artery disease involving native coronary artery of native heart without angina pectoris -Stable without complaints of chest pain.  EKG nonacute    Chronic low back pain (Primary area of Pain) (Bilateral)  (L>R) w/o sciatica -Not acutely worsened.  Continue home meds.  Benign essential tremor - continue primidone  Peripheral neuropathy due to chemotherapy --continue home gabapentin 900 mg TID   DVT prophylaxis: SCDs in case patient needs IR procedure in the a.m. code Status: full code  Family Communication:  none  Disposition Plan: Back to previous home environment Consults called: consult CT surgery in the am per Dr Celine Ahr Status: Observation    Athena Masse MD [facility]     04/26/2020, 2:13 AM

## 2020-04-26 NOTE — Progress Notes (Signed)
Pt in CT for chest tube placement, no sedation administered due to pt decrease O2 sat and elevated ETCO2.  Pt alert/oriented during the procedure, non-rebreather in place during procedure.  Pt assisted from CT table to hospital bed without difficulty.  Pt returned to room 211 via hospital bed, O2, and telemetry in place.  Report given to Otilio Carpen, RN.  Primary nurse in room on arrival.  Pt VSS and chest tube dressing c/d/i on arrival to room .

## 2020-04-26 NOTE — ED Notes (Signed)
Report given to receiving RN.

## 2020-04-26 NOTE — ED Provider Notes (Addendum)
[facility] Emergency Department Provider Note ____________________________________________   First MD Initiated Contact with Patient 04/26/20 [phone]     (approximate)  I have reviewed the triage vital signs and the nursing notes.  HISTORY  Chief Complaint Shortness of Breath   HPI Joseph Graham is a 79 y.o. malewho presents to the ED for evaluation of shortness of breath.   Chart review indicates hx COPD on 0-2L home O2.  Recent admission from 11/5-11/8 for pneumothorax requiring pigtail chest tube placement.  Uncomplicated course, 35-TDDU obs of chest tube and discharged home.  No blood thinners.  Patient reports getting back to normal after this recent admission, which includes using his home oxygen as needed up to 2 L.  He reports he frequently walks to his kitchen without oxygen, makes a meal and then returns to his oxygen.  He reports developing similar shortness of breath and dyspnea on exertion over the past 2-3 days, similar as his previous PTX.  He denies any increased sputum production, fever, chest pain, emesis, trauma or falls.  He reports mild conversational dyspnea, but largely feels okay while seated and has primarily exertional symptoms.  Patient has completed 2/5 cycles of chemotherapy.  He reports being scheduled to see Dr. Genevive Bi, his CT surgeon in the clinic this morning for repeat chest x-ray.   Past Medical History:  Diagnosis Date  . Alcohol abuse, in remission    remote   . Arthritis    BACK AND NECK  . Benign essential tremor    improved on metoprolol and gabapentin  . BPH (benign prostatic hypertrophy) 12/30/2012  . Cardiac arrhythmia 03/2010   h/o a flutter and CM, s/p ablation, normal stress test  . Cataract    bilat removed   . Chemotherapy-induced neutropenia ([place]) 09/23/2015  . Chronic low back pain    MRI 2004, multilevel DDD  . Colon polyps    next colonoscopy due around 2018  . Complication of anesthesia age 10-23    DID NOT GET COMPLETELY NUMB WITH TONSILLECTOMY  . COPD (chronic obstructive pulmonary disease) ([place])   . Coronary artery disease   . Dyslipidemia    mild off meds  . GERD (gastroesophageal reflux disease)   . History of kidney stones    h/o  . HTN (hypertension)   . Macular degeneration, bilateral   . Multiple pulmonary nodules 02/2015  . Neuromuscular disorder ([place])    neuropathy from chemo....in feet  . Peripheral neuropathy due to chemotherapy ([place]) 12/23/2015   Saw Dr Manuella Ghazi, on gabapentin and cymbalta with hydrocodone PRN (03/2016)  . Personal history of tobacco use, presenting hazards to health 02/18/2015   quit 06/2011, restarted 2014  . Pneumothorax after biopsy 04/01/2015  . Pre-diabetes   . Primary cancer of bladder ([place]) 2016   Budzyn  . Primary lung cancer ([place]) 2016   port a cath removed 03/2016  . Shortness of breath dyspnea    OCC WITH EXERTION    Patient Active Problem List   Diagnosis Date Noted  . Recurrent pneumothorax after pleural drain removed 04/26/2020  . Chronic respiratory failure with hypoxia ([place]) 04/26/2020  . Acute on chronic respiratory failure with hypoxia ([place]) 04/26/2020  . 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 Pneumothorax on right 04/15/2020  . Grade 1 Retrolisthesis of L2/L3 11/25/2019  . Abnormal CT scan, lumbar spine (01/22/2019) 11/25/2019  . Osteoarthritis involving multiple joints 08/18/2019  . Rhinitis medicamentosa 07/14/2019  . Right bundle branch block (RBBB) with left anterior  hemiblock 01/21/2019  . Other specified spondylopathies, sacral and sacrococcygeal region ([place]) 01/15/2019  . Spondylosis without myelopathy or radiculopathy, lumbosacral region 08/28/2018  . Abnormal MRI, lumbar spine (06/12/2018) 08/20/2018  . DDD (degenerative disc disease), lumbosacral 08/07/2018  . Coccygodynia 08/07/2018  . Lumbar facet hypertrophy (Multilevel) (Bilateral) 08/04/2018  . Osteoarthritis of facet joint of lumbar spine 08/04/2018  . Lumbar  spondylosis 08/04/2018  . Lumbar facet syndrome (Bilateral) (L>R) 08/04/2018  . Lumbar lateral recess stenosis (L2-3, L3-4, L4-5) (Right) 08/04/2018  . Lumbar foraminal stenosis (Right: L2-3, L3-4) (Bilateral: L4-5) 08/04/2018  . Chronic low back pain (Primary area of Pain) (Bilateral)  (L>R) w/o sciatica 07/14/2018  . Chronic coccyx pain (Secondary Area of Pain) (Midline) 07/14/2018  . Chronic lower extremity pain [facility] Area of Pain) (Bilateral) (R>L) 07/14/2018  . Chronic pain syndrome 07/14/2018  . Long term current use of opiate analgesic 07/14/2018  . Pharmacologic therapy 07/14/2018  . Disorder of skeletal system 07/14/2018  . Problems influencing health status 07/14/2018  . HLD (hyperlipidemia) 07/10/2018  . DDD (degenerative disc disease), lumbar 07/10/2018  . Macular degeneration of both eyes 10/01/2017  . Class 1 obesity due to excess calories with serious comorbidity and body mass index (BMI) of 31.0 to 31.9 in adult 07/30/2016  . Mixed sensory-motor polyneuropathy 07/30/2016  . Vitamin B12 deficiency 07/30/2016  . Peripheral neuropathy due to chemotherapy ([place]) 12/23/2015  . Malignant neoplasm of right upper lobe of lung ([place]) 12/20/2015  . Bilateral lower extremity edema 12/09/2015  . COPD (chronic obstructive pulmonary disease) ([place])   . Primary cancer of bladder ([place])   . PAD (peripheral artery disease) ([place]) 03/01/2015  . Coronary artery disease involving native coronary artery of native heart without angina pectoris 03/01/2015  . Health maintenance examination 01/05/2014  . Advanced care planning/counseling discussion 01/05/2014  . Prediabetes 12/30/2012  . Benign prostatic hyperplasia 12/30/2012  . Special screening for malignant neoplasms, colon 07/31/2011  . Medicare annual wellness visit, subsequent 07/31/2011  . Benign essential tremor   . Personal history of colonic polyps - adenomas   . HTN (hypertension)   . Ex-smoker   . Cardiac arrhythmia 03/11/2010     Past Surgical History:  Procedure Laterality Date  . A FLUTTER ABLATION  03/2010  . carotid US  03/2010   WNL  . CATARACT EXTRACTION    . COLONOSCOPY  09/2011   polyps, rpt due 5 yrs, mild diverticulosis [name])  . CYSTOSCOPY W/ RETROGRADES Bilateral 04/06/2015   Procedure: CYSTOSCOPY WITH RETROGRADE PYELOGRAM;  Surgeon: [name], MD;  Location: [facility] ORS;  Service: Urology;  Laterality: Bilateral;  . ELECTROMAGNETIC NAVIGATION BROCHOSCOPY N/A 03/02/2015   Procedure: ELECTROMAGNETIC NAVIGATION BRONCHOSCOPY;  Surgeon: [name], MD;  Location: [facility] ORS;  Service: Cardiopulmonary;  Laterality: N/A;  . 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 HEMORROIDECTOMY  1982  . LUNG BIOPSY     collapsed lung with chest tube  . POLYPECTOMY    . PORT-A-CATH REMOVAL Right 04/03/2016   Procedure: REMOVAL PORT-A-CATH;  Surgeon: [name], MD;  Location: [facility] ORS;  Service: General;  Laterality: Right;  . PORTACATH PLACEMENT N/A 08/10/2015   Procedure: INSERTION PORT-A-CATH;  Surgeon: [name], MD;  Location: [facility] ORS;  Service: General;  Laterality: N/A;  . [place]  . TRANSURETHRAL RESECTION OF BLADDER TUMOR N/A 04/06/2015   Procedure: TRANSURETHRAL RESECTION OF BLADDER TUMOR (TURBT) right ureteral  stent placement;  Surgeon: [name], MD;  Location: [facility] ORS;  Service: Urology;  Laterality: N/A;  . TRANSURETHRAL RESECTION OF BLADDER TUMOR N/A 07/13/2015   Procedure: TRANSURETHRAL RESECTION OF BLADDER TUMOR (TURBT);  Surgeon: [name], MD;  Location: [facility] ORS;  Service: Urology;  Laterality: N/A;  . urinary stent removal  04/14/15    Prior to Admission medications   Medication Sig Start Date End Date Taking? Authorizing Provider  albuterol (PROAIR HFA) 108 (90 Base) MCG/ACT inhaler Inhale 2 puffs into the lungs every 6  (six) hours as needed for wheezing or shortness of breath. 01/14/20  Yes [name], MD  ASPIRIN 81 PO Take 1 tablet by mouth daily.   Yes [provider]  Cholecalciferol (VITAMIN D-3) 125 MCG (5000 UT) TABS Take 5,000 Units by mouth daily.   Yes [provider]  gabapentin (NEURONTIN) 300 MG capsule Take 300 mg by mouth 3 (three) times daily. (Take with 600 mg for total of 900 mg three times daily) 10/06/19  Yes [provider]  gabapentin (NEURONTIN) 600 MG tablet Take 600 mg by mouth 3 (three) times daily. (Take with 300 mg for total of 900 mg three times daily) 03/15/20 03/15/21 Yes [provider]  HYDROcodone-acetaminophen (NORCO) 10-325 MG tablet Take 1 tablet by mouth every 6 (six) hours as needed for moderate pain or severe pain. 04/20/20  Yes Borders, Kirt Boys, NP  lisinopril (ZESTRIL) 10 MG tablet TAKE 1 TABLET BY MOUTH EVERY EVENING Patient taking differently: Take 10 mg by mouth every evening.  10/02/19  Yes [name], MD  metoprolol tartrate (LOPRESSOR) 25 MG tablet TAKE 1/2 TABLET BY MOUTH TWICE DAILY Patient taking differently: Take 12.5 mg by mouth 2 (two) times daily.  10/02/19  Yes [name], MD  Multiple Vitamin (MULTIVITAMIN WITH MINERALS) TABS tablet Take 1 tablet by mouth every evening.   Yes [provider]  NON FORMULARY Take 500 mg by mouth daily. Hemp Oil 500 mg   Yes [provider]  omeprazole (PRILOSEC) 20 MG capsule Take 20 mg by mouth every morning.    Yes [provider]  oxymetazoline (AFRIN) 0.05 % nasal spray Place 1 spray into both nostrils 2 (two) times daily.   Yes [provider]  polyethylene glycol (MIRALAX / GLYCOLAX) packet Take 17 g by mouth daily.   Yes [provider]  primidone (MYSOLINE) 50 MG tablet Take 150 mg by mouth 2 (two) times daily.   Yes [provider]  rosuvastatin (CRESTOR) 10 MG tablet Take 1 tablet (10 mg total) by mouth daily.  01/14/20  Yes [name], MD  vitamin B-12 (CYANOCOBALAMIN) 100 MCG tablet Take 100 mcg by mouth every evening.   Yes [provider]  vitamin E 400 UNIT capsule Take 400 Units by mouth every evening.    Yes [provider]  fluticasone (FLONASE) 50 MCG/ACT nasal spray Place 2 sprays into both nostrils daily. Patient not taking: Reported on 04/16/2020 01/14/20   [name], MD  naloxone [facility]) nasal spray 4 mg/0.1 mL 1 spray into nostril upon signs of opioid overdose. Call 911. May repeat once if no response within 2-3 minutes. 04/14/20   Borders, Kirt Boys, NP  OXYGEN Inhale 2 L into the lungs daily.    [provider]    Allergies Patient has no known allergies.  Family History  Problem Relation Age of Onset  . Cervical cancer Mother 73  . Hypertension Father   . Tremor  Father        and several uncles/aunts (not parkinson's)  . Stroke Brother 30  . Bladder Cancer Brother 49  . Prostate cancer Neg Hx   . Kidney cancer Neg Hx   . Colon cancer Neg Hx   . Colon polyps Neg Hx   . Esophageal cancer Neg Hx   . Rectal cancer Neg Hx     Social History Social History   Tobacco Use  . Smoking status: Former Smoker    Packs/day: 1.50    Years: 61.00    Pack years: 91.50    Types: Cigarettes    Quit date: 03/08/2015    Years since quitting: 5.1  . Smokeless tobacco: Never Used  . Tobacco comment: cut back 0.5 cigarettes--stopped 03/07/15; now now chantix  Vaping Use  . Vaping Use: Every day  . Substances: Nicotine  Substance Use Topics  . Alcohol use: No    Alcohol/week: 0.0 standard drinks    Comment: has not drank in 37 years  . Drug use: No    Review of Systems  Constitutional: No fever/chills Eyes: No visual changes. ENT: No sore throat. Cardiovascular: Denies chest pain. Respiratory: Positive for shortness of breath and DOE Gastrointestinal: No abdominal pain.  No nausea, no vomiting.  No diarrhea.  No  constipation. Genitourinary: Negative for dysuria. Musculoskeletal: Negative for back pain. Skin: Negative for rash. Neurological: Negative for headaches, focal weakness or numbness.  ____________________________________________   PHYSICAL EXAM:  VITAL SIGNS: Vitals:   04/26/20 0300 04/26/20 0413  BP: (!) 120/49 (!) 152/60  Pulse: (!) 57 73  Resp: 14 17  Temp:  97.6 F (36.4 C)  SpO2: 96% 95%     Constitutional: Alert and oriented.  Uncomfortable-appearing, but no acute distress.  Conversational in short phrases only. Eyes: Conjunctivae are normal. PERRL. EOMI. Head: Atraumatic. Nose: No congestion/rhinnorhea. Mouth/Throat: Mucous membranes are moist.  Oropharynx non-erythematous. Neck: No stridor. No cervical spine tenderness to palpation. Cardiovascular: Normal rate, regular rhythm. Grossly normal heart sounds.  Good peripheral circulation. Respiratory: No retractions.  Mild tachypnea to the low 20s.  Faint and scattered expiratory wheezes throughout, decreased breath sounds to the right base and right lateral lung fields. Gastrointestinal: Soft , nondistended, nontender to palpation. No CVA tenderness. Musculoskeletal: No lower extremity deformity.  No joint effusions. No signs of acute trauma. Neurologic:  Normal speech and language. No gross focal neurologic deficits are appreciated. No gait instability noted. Skin:  Skin is warm, dry and intact. No rash noted. Psychiatric: Mood and affect are normal. Speech and behavior are normal.  ____________________________________________   LABS (all labs ordered are listed, but only abnormal results are displayed)  Labs Reviewed  BASIC METABOLIC PANEL - Abnormal; Notable for the following components:      Result Value   Glucose, Bld 145 (*)    BUN 25 (*)    Creatinine, Ser 0.53 (*)    Calcium 8.2 (*)    All other components within normal limits  CBC - Abnormal; Notable for the following components:   RBC 3.83 (*)     Hemoglobin 12.1 (*)    HCT 38.5 (*)    MCV 100.5 (*)    All other components within normal limits  TROPONIN I (HIGH SENSITIVITY) - Abnormal; Notable for the following components:   Troponin I (High Sensitivity) 18 (*)    All other components within normal limits  TROPONIN I (HIGH SENSITIVITY) - Abnormal; Notable for the following components:   Troponin I (High  Sensitivity) 21 (*)    All other components within normal limits   ____________________________________________  12 Lead EKG  Sinus rhythm, rate of 73 bpm.  Normal axis.  Bifascicular block with right bundle and left anterior fascicular block, at baseline.  No evidence of acute ischemia. ____________________________________________  RADIOLOGY  ED MD interpretation: 2 view CXR reviewed by me with recurrent moderate right-sided pneumothorax  Official radiology report(s): DG Chest 2 View  Result Date: 04/26/2020 CLINICAL DATA:  Shortness of breath EXAM: CHEST - 2 VIEW COMPARISON:  04/18/2020 FINDINGS: Recurrent right pneumothorax measuring approximately 18 mm at the lateral right chest. Left lung is clear. No mediastinal shift. Normal cardiomediastinal contours. IMPRESSION: Recurrent right pneumothorax, measuring approximately 18 mm. These results were called by telephone at the time of interpretation on 04/26/2020 at 12:41 am to provider [facility] , who verbally acknowledged these results. Electronically Signed   By: Ulyses Jarred M.D.   On: 04/26/2020 00:42    ____________________________________________   PROCEDURES and INTERVENTIONS  Procedure(s) performed (including Critical Care):  .1-3 Lead EKG Interpretation Performed by: Vladimir Crofts, MD Authorized by: Vladimir Crofts, MD     Interpretation: normal     ECG rate:  76   ECG rate assessment: normal     Rhythm: sinus rhythm     Ectopy: none     Conduction: normal   .Critical Care Performed by: Vladimir Crofts, MD Authorized by: Vladimir Crofts, MD   Critical  care provider statement:    Critical care time (minutes):  31   Critical care was necessary to treat or prevent imminent or life-threatening deterioration of the following conditions:  Respiratory failure   Critical care was time spent personally by me on the following activities:  Discussions with consultants, evaluation of patient's response to treatment, examination of patient, ordering and performing treatments and interventions, ordering and review of laboratory studies, ordering and review of radiographic studies, pulse oximetry, re-evaluation of patient's condition, obtaining history from patient or surrogate and review of old charts    Medications  acetaminophen (TYLENOL) tablet 650 mg (has no administration in time range)    Or  acetaminophen (TYLENOL) suppository 650 mg (has no administration in time range)  ondansetron (ZOFRAN) tablet 4 mg (has no administration in time range)    Or  ondansetron (ZOFRAN) injection 4 mg (has no administration in time range)  acetaminophen (TYLENOL) tablet 1,000 mg (1,000 mg Oral Given 04/26/20 0123)  oxyCODONE (Oxy IR/ROXICODONE) immediate release tablet 10 mg (10 mg Oral Given 04/26/20 0123)    ____________________________________________   MDM / ED COURSE   79 year old male with known COPD and lung cancer, presents to the ED with recurrent right-sided pneumothorax, without indications for urgent ED placement of chest tube, and requiring medical admission.  Normal vital signs on home O2.  Exam does demonstrate evidence of conversational dyspnea, but no distress or signs of trauma.  Blood work is unremarkable.  Patient has no signs or symptoms of pneumonia or acute infectious pathology.  CXR reviewed demonstrating a similar mild/moderate right-sided pneumothorax.  Reviewing his CT scan from recent admission demonstrate significant bleb burden, and considering his stable clinical status, no indications for urgent ED placement of chest tube at this  time.  I think he would benefit from IR placement of chest tube, similar to previous admission.  We will admit the patient to hospitalist medicine for further work-up and management.   Clinical Course as of Apr 26 657  Tue Apr 26, 2020  0040 Chest  x-rays reviewed from today and recent admission   [DS]  0141 Spoke with hospitalist, Dr. Damita Dunnings, who request that I also informed general surgery of this patient for any updated recommendations and to ensure that someone is aware of him in case he were to decompensate overnight and require chest tube placement prior to CT surgery evaluation. General surgery paged   [DS]  0153 I speak with Dr. Celine Ahr, general surgery on-call.  She recommends medical admission and is aware of the patient and available in case he were to worsen prior to CT surgery evaluation in the morning.   [DS]    Clinical Course User Index [DS] Vladimir Crofts, MD    ____________________________________________   FINAL CLINICAL IMPRESSION(S) / ED DIAGNOSES  Final diagnoses:  Primary spontaneous pneumothorax  Shortness of breath  Dyspnea on exertion  Chronic obstructive pulmonary disease, unspecified COPD type [facility])     ED Discharge Orders    None       Myron Lona   Note:  This document was prepared using Dragon voice recognition software and may include unintentional dictation errors.   Vladimir Crofts, MD 04/26/20 Francene Castle    Vladimir Crofts, MD 04/26/20 [phone]

## 2020-04-27 ENCOUNTER — Inpatient Hospital Stay: Payer: Medicare Other | Admitting: Anesthesiology

## 2020-04-27 ENCOUNTER — Encounter
Admission: EM | Disposition: A | Discharge status: Home Health | Payer: Self-pay | Source: Home / Self Care | Attending: Internal Medicine

## 2020-04-27 ENCOUNTER — Inpatient Hospital Stay: Payer: Medicare Other

## 2020-04-27 DIAGNOSIS — I10 Essential (primary) hypertension: Secondary | ICD-10-CM

## 2020-04-27 DIAGNOSIS — C3411 Malignant neoplasm of upper lobe, right bronchus or lung: Secondary | ICD-10-CM

## 2020-04-27 DIAGNOSIS — J95811 Postprocedural pneumothorax: Secondary | ICD-10-CM | POA: Diagnosis not present

## 2020-04-27 HISTORY — PX: VIDEO ASSISTED THORACOSCOPY (VATS) W/TALC PLEUADESIS: SHX6168

## 2020-04-27 LAB — CBC WITH DIFFERENTIAL/PLATELET
Abs Immature Granulocytes: 0.03 10*3/uL (ref 0.00–0.07)
Basophils Absolute: 0 10*3/uL (ref 0.0–0.1)
Basophils Relative: 0 %
Eosinophils Absolute: 0.2 10*3/uL (ref 0.0–0.5)
Eosinophils Relative: 3 %
HCT: 35.7 % — ABNORMAL LOW (ref 39.0–52.0)
Hemoglobin: 10.9 g/dL — ABNORMAL LOW (ref 13.0–17.0)
Immature Granulocytes: 0 %
Lymphocytes Relative: 14 %
Lymphs Abs: 1.1 10*3/uL (ref 0.7–4.0)
MCH: 31.5 pg (ref 26.0–34.0)
MCHC: 30.5 g/dL (ref 30.0–36.0)
MCV: 103.2 fL — ABNORMAL HIGH (ref 80.0–100.0)
Monocytes Absolute: 0.9 10*3/uL (ref 0.1–1.0)
Monocytes Relative: 11 %
Neutro Abs: 5.8 10*3/uL (ref 1.7–7.7)
Neutrophils Relative %: 72 %
Platelets: 191 10*3/uL (ref 150–400)
RBC: 3.46 MIL/uL — ABNORMAL LOW (ref 4.22–5.81)
RDW: 13.3 % (ref 11.5–15.5)
WBC: 8.1 10*3/uL (ref 4.0–10.5)
nRBC: 0 % (ref 0.0–0.2)

## 2020-04-27 LAB — BASIC METABOLIC PANEL
Anion gap: 9 (ref 5–15)
Anion gap: 10 (ref 5–15)
BUN: 22 mg/dL (ref 8–23)
BUN: 20 mg/dL (ref 8–23)
CO2: 37 mmol/L — ABNORMAL HIGH (ref 22–32)
CO2: 33 mmol/L — ABNORMAL HIGH (ref 22–32)
Calcium: 8.5 mg/dL — ABNORMAL LOW (ref 8.9–10.3)
Calcium: 7.9 mg/dL — ABNORMAL LOW (ref 8.9–10.3)
Chloride: 95 mmol/L — ABNORMAL LOW (ref 98–111)
Chloride: 98 mmol/L (ref 98–111)
Creatinine, Ser: 0.62 mg/dL (ref 0.61–1.24)
Creatinine, Ser: 0.62 mg/dL (ref 0.61–1.24)
GFR, Estimated: >60 mL/min (ref >60)
GFR, Estimated: >60 mL/min (ref >60)
Glucose, Bld: 96 mg/dL (ref 70–99)
Glucose, Bld: 133 mg/dL — ABNORMAL HIGH (ref 70–99)
Potassium: 4.2 mmol/L (ref 3.5–5.1)
Potassium: 4.3 mmol/L (ref 3.5–5.1)
Sodium: 141 mmol/L (ref 135–145)
Sodium: 141 mmol/L (ref 135–145)

## 2020-04-27 LAB — CBC
HCT: 40.4 % (ref 39.0–52.0)
Hemoglobin: 12.4 g/dL — ABNORMAL LOW (ref 13.0–17.0)
MCH: 31.7 pg (ref 26.0–34.0)
MCHC: 30.7 g/dL (ref 30.0–36.0)
MCV: 103.3 fL — ABNORMAL HIGH (ref 80.0–100.0)
Platelets: 189 10*3/uL (ref 150–400)
RBC: 3.91 MIL/uL — ABNORMAL LOW (ref 4.22–5.81)
RDW: 13.2 % (ref 11.5–15.5)
WBC: 10.1 10*3/uL (ref 4.0–10.5)
nRBC: 0 % (ref 0.0–0.2)

## 2020-04-27 SURGERY — VIDEO ASSISTED THORACOSCOPY (VATS) W/TALC PLEUADESIS
Anesthesia: General | Laterality: Right

## 2020-04-27 MED ORDER — ROCURONIUM BROMIDE 100 MG/10ML IV SOLN
INTRAVENOUS | Status: DC | PRN
  Administered 2020-04-27: 40 mg via INTRAVENOUS

## 2020-04-27 MED ORDER — PROPOFOL 10 MG/ML IV BOLUS
INTRAVENOUS | Status: DC | PRN
  Administered 2020-04-27: 120 mg via INTRAVENOUS

## 2020-04-27 MED ORDER — LIDOCAINE HCL (CARDIAC) PF 100 MG/5ML IV SOSY
PREFILLED_SYRINGE | INTRAVENOUS | Status: DC | PRN
  Administered 2020-04-27: 100 mg via INTRAVENOUS

## 2020-04-27 MED ORDER — ACETAMINOPHEN 10 MG/ML IV SOLN
INTRAVENOUS | Status: DC | PRN
  Administered 2020-04-27: 1000 mg via INTRAVENOUS

## 2020-04-27 MED ORDER — ARTIFICIAL TEARS OPHTHALMIC OINT
TOPICAL_OINTMENT | OPHTHALMIC | Status: AC
  Filled 2020-04-27: qty 3.5

## 2020-04-27 MED ORDER — FENTANYL CITRATE (PF) 100 MCG/2ML IJ SOLN
INTRAMUSCULAR | Status: DC | PRN
  Administered 2020-04-27: 100 ug via INTRAVENOUS

## 2020-04-27 MED ORDER — OXYCODONE HCL 5 MG/5ML PO SOLN: 5.0000 mg | Freq: Once | ORAL | Status: DC | PRN

## 2020-04-27 MED ORDER — NALOXONE HCL 0.4 MG/ML IJ SOLN
INTRAMUSCULAR | Status: AC
  Filled 2020-04-27: qty 1

## 2020-04-27 MED ORDER — NALOXONE HCL 0.4 MG/ML IJ SOLN
INTRAMUSCULAR | Status: DC | PRN
  Administered 2020-04-27: .04 mg via INTRAVENOUS

## 2020-04-27 MED ORDER — DEXAMETHASONE SODIUM PHOSPHATE 10 MG/ML IJ SOLN
INTRAMUSCULAR | Status: AC
  Filled 2020-04-27: qty 2

## 2020-04-27 MED ORDER — LIDOCAINE HCL 4 % MT SOLN
OROMUCOSAL | Status: DC | PRN
  Administered 2020-04-27: 4 mL via TOPICAL

## 2020-04-27 MED ORDER — OXYCODONE HCL 5 MG PO TABS: 5.0000 mg | ORAL_TABLET | Freq: Once | ORAL | Status: DC | PRN

## 2020-04-27 MED ORDER — DEXAMETHASONE SODIUM PHOSPHATE 10 MG/ML IJ SOLN
INTRAMUSCULAR | Status: DC | PRN
  Administered 2020-04-27: 10 mg via INTRAVENOUS

## 2020-04-27 MED ORDER — LIDOCAINE HCL (PF) 2 % IJ SOLN
INTRAMUSCULAR | Status: AC
  Filled 2020-04-27: qty 5

## 2020-04-27 MED ORDER — ACETAMINOPHEN 10 MG/ML IV SOLN
INTRAVENOUS | Status: AC
  Filled 2020-04-27: qty 100

## 2020-04-27 MED ORDER — LIDOCAINE HCL 4 % EX SOLN
CUTANEOUS | Status: DC | PRN
  Administered 2020-04-27: 4 mL via TOPICAL

## 2020-04-27 MED ORDER — TALC 5 G PL SUSR
INTRAPLEURAL | Status: DC | PRN
  Administered 2020-04-27: 4 g via INTRAPLEURAL

## 2020-04-27 MED ORDER — ROCURONIUM BROMIDE 10 MG/ML (PF) SYRINGE
PREFILLED_SYRINGE | INTRAVENOUS | Status: AC
  Filled 2020-04-27: qty 20

## 2020-04-27 MED ORDER — SEVOFLURANE IN SOLN
RESPIRATORY_TRACT | Status: AC
  Filled 2020-04-27: qty 250

## 2020-04-27 MED ORDER — SUGAMMADEX SODIUM 200 MG/2ML IV SOLN
INTRAVENOUS | Status: DC | PRN
  Administered 2020-04-27 (×2): 200 mg via INTRAVENOUS

## 2020-04-27 MED ORDER — FENTANYL CITRATE (PF) 100 MCG/2ML IJ SOLN
INTRAMUSCULAR | Status: AC
  Filled 2020-04-27: qty 2

## 2020-04-27 MED ORDER — PHENYLEPHRINE HCL (PRESSORS) 10 MG/ML IV SOLN
INTRAVENOUS | Status: AC
  Filled 2020-04-27: qty 2

## 2020-04-27 MED ORDER — MIDAZOLAM HCL 2 MG/2ML IJ SOLN
INTRAMUSCULAR | Status: AC
  Filled 2020-04-27: qty 2

## 2020-04-27 MED ORDER — ROCURONIUM BROMIDE 10 MG/ML (PF) SYRINGE
PREFILLED_SYRINGE | INTRAVENOUS | Status: AC
  Filled 2020-04-27: qty 10

## 2020-04-27 MED ORDER — LACTATED RINGERS IV SOLN: INTRAVENOUS | Status: DC | PRN

## 2020-04-27 MED ORDER — SUGAMMADEX SODIUM 500 MG/5ML IV SOLN
INTRAVENOUS | Status: AC
  Filled 2020-04-27: qty 5

## 2020-04-27 MED ORDER — ONDANSETRON HCL 4 MG/2ML IJ SOLN
INTRAMUSCULAR | Status: AC
  Filled 2020-04-27: qty 2

## 2020-04-27 MED ORDER — SUCCINYLCHOLINE CHLORIDE 200 MG/10ML IV SOSY
PREFILLED_SYRINGE | INTRAVENOUS | Status: AC
  Filled 2020-04-27: qty 20

## 2020-04-27 MED ORDER — FENTANYL CITRATE (PF) 100 MCG/2ML IJ SOLN: 25.0000 ug | INTRAMUSCULAR | Status: DC | PRN

## 2020-04-27 MED ORDER — ACETAMINOPHEN 10 MG/ML IV SOLN: 1000.0000 mg | Freq: Once | INTRAVENOUS | Status: DC | PRN

## 2020-04-27 MED ORDER — SUCCINYLCHOLINE CHLORIDE 20 MG/ML IJ SOLN
INTRAMUSCULAR | Status: DC | PRN
  Administered 2020-04-27: 120 mg via INTRAVENOUS

## 2020-04-27 MED ORDER — DEXAMETHASONE SODIUM PHOSPHATE 10 MG/ML IJ SOLN
INTRAMUSCULAR | Status: AC
  Filled 2020-04-27: qty 1

## 2020-04-27 MED ORDER — GLYCOPYRROLATE 0.2 MG/ML IJ SOLN
INTRAMUSCULAR | Status: AC
  Filled 2020-04-27: qty 1

## 2020-04-27 MED ORDER — ONDANSETRON HCL 4 MG/2ML IJ SOLN
INTRAMUSCULAR | Status: DC | PRN
  Administered 2020-04-27: 4 mg via INTRAVENOUS

## 2020-04-27 MED ORDER — TRAMADOL HCL 50 MG PO TABS
50.0000 mg | ORAL_TABLET | Freq: Four times a day (QID) | ORAL | Status: DC
  Administered 2020-04-27 – 2020-04-28 (×2): 100 mg via ORAL
  Administered 2020-04-28 (×2): 50 mg via ORAL
  Administered 2020-04-28 – 2020-04-29 (×4): 100 mg via ORAL
  Administered 2020-04-29: 50 mg via ORAL
  Administered 2020-04-30: 100 mg via ORAL
  Administered 2020-04-30: 50 mg via ORAL
  Administered 2020-04-30: 100 mg via ORAL
  Administered 2020-04-30: 50 mg via ORAL
  Administered 2020-05-01 – 2020-05-02 (×5): 100 mg via ORAL
  Administered 2020-05-02: 50 mg via ORAL
  Administered 2020-05-03: 100 mg via ORAL
  Filled 2020-04-27 (×2): qty 2
  Filled 2020-04-27: qty 1
  Filled 2020-04-27: qty 2
  Filled 2020-04-27 (×2): qty 1
  Filled 2020-04-27 (×9): qty 2
  Filled 2020-04-27: qty 1
  Filled 2020-04-27 (×4): qty 2

## 2020-04-27 MED ORDER — CEFAZOLIN SODIUM-DEXTROSE 2-4 GM/100ML-% IV SOLN
2.0000 g | Freq: Once | INTRAVENOUS | Status: AC
  Administered 2020-04-27: 2 g via INTRAVENOUS

## 2020-04-27 MED ORDER — DEXTROSE-NACL 5-0.45 % IV SOLN: INTRAVENOUS | Status: DC

## 2020-04-27 MED ORDER — EPHEDRINE 5 MG/ML INJ
INTRAVENOUS | Status: AC
  Filled 2020-04-27: qty 10

## 2020-04-27 MED ORDER — BUPIVACAINE HCL 0.25 % IJ SOLN
INTRAMUSCULAR | Status: DC | PRN
  Administered 2020-04-27: 20 mL

## 2020-04-27 MED ORDER — MORPHINE SULFATE (PF) 2 MG/ML IV SOLN: 1.0000 mg | INTRAVENOUS | Status: DC | PRN

## 2020-04-27 MED ORDER — CEFAZOLIN SODIUM-DEXTROSE 2-4 GM/100ML-% IV SOLN
2.0000 g | Freq: Three times a day (TID) | INTRAVENOUS | Status: AC
  Administered 2020-04-27 – 2020-04-28 (×2): 2 g via INTRAVENOUS
  Filled 2020-04-27 (×2): qty 100

## 2020-04-27 MED ORDER — CEFAZOLIN SODIUM-DEXTROSE 2-4 GM/100ML-% IV SOLN
INTRAVENOUS | Status: AC
  Filled 2020-04-27: qty 100

## 2020-04-27 MED ORDER — ONDANSETRON HCL 4 MG/2ML IJ SOLN
INTRAMUSCULAR | Status: AC
  Filled 2020-04-27: qty 4

## 2020-04-27 MED ORDER — ONDANSETRON HCL 4 MG/2ML IJ SOLN: 4.0000 mg | Freq: Once | INTRAMUSCULAR | Status: DC | PRN

## 2020-04-27 MED ORDER — MIDAZOLAM HCL 2 MG/2ML IJ SOLN
INTRAMUSCULAR | Status: DC | PRN
  Administered 2020-04-27: 2 mg via INTRAVENOUS

## 2020-04-27 MED ORDER — LIDOCAINE HCL (PF) 2 % IJ SOLN
INTRAMUSCULAR | Status: AC
  Filled 2020-04-27: qty 10

## 2020-04-27 SURGICAL SUPPLY — 72 items
BRONCHOSCOPE BFLEX 3.8 (MISCELLANEOUS) ×2 IMPLANT
CANISTER SUCT 1200ML W/VALVE (MISCELLANEOUS) ×2 IMPLANT
CATH THOR STR 28F  SOFT WA (CATHETERS) ×1
CATH THOR STR 28F SOFT WA (CATHETERS) ×1 IMPLANT
CATH URET ROBINSON 16FR STRL (CATHETERS) IMPLANT
CHLORAPREP W/TINT 26 (MISCELLANEOUS) ×4 IMPLANT
CNTNR SPEC 2.5X3XGRAD LEK (MISCELLANEOUS) ×4
CONNECTOR REDUCER 3/8X3/8 (MISCELLANEOUS) ×2 IMPLANT
CONT SPEC 4OZ STER OR WHT (MISCELLANEOUS) ×4
CONTAINER SPEC 2.5X3XGRAD LEK (MISCELLANEOUS) ×4 IMPLANT
CUTTER ECHEON FLEX ENDO 45 340 (ENDOMECHANICALS) IMPLANT
DEFOGGER SCOPE WARMER CLEARIFY (MISCELLANEOUS) ×2 IMPLANT
DRAIN CHEST DRY SUCT SGL (MISCELLANEOUS) IMPLANT
DRAPE C-SECTION (MISCELLANEOUS) ×2 IMPLANT
DRAPE MAG INST 16X20 L/F (DRAPES) ×2 IMPLANT
DRSG OPSITE POSTOP 3X4 (GAUZE/BANDAGES/DRESSINGS) ×2 IMPLANT
DRSG OPSITE POSTOP 4X6 (GAUZE/BANDAGES/DRESSINGS) ×2 IMPLANT
DRSG OPSITE POSTOP 4X8 (GAUZE/BANDAGES/DRESSINGS) ×2 IMPLANT
ELECT BLADE 6.5 EXT (BLADE) ×2 IMPLANT
ELECT CAUTERY BLADE TIP 2.5 (TIP) ×2
ELECT REM PT RETURN 9FT ADLT (ELECTROSURGICAL) ×2
ELECTRODE CAUTERY BLDE TIP 2.5 (TIP) ×1 IMPLANT
ELECTRODE REM PT RTRN 9FT ADLT (ELECTROSURGICAL) ×1 IMPLANT
GAUZE SPONGE 4X4 12PLY STRL (GAUZE/BANDAGES/DRESSINGS) ×4 IMPLANT
GLOVE SURG SYN 7.5  E (GLOVE) ×2
GLOVE SURG SYN 7.5 E (GLOVE) ×2 IMPLANT
GOWN STRL REUS W/ TWL LRG LVL3 (GOWN DISPOSABLE) ×4 IMPLANT
GOWN STRL REUS W/TWL LRG LVL3 (GOWN DISPOSABLE) ×4
KIT TURNOVER KIT A (KITS) ×2 IMPLANT
LABEL OR SOLS (LABEL) ×2 IMPLANT
LOOP RED MAXI  1X406MM (MISCELLANEOUS) ×1
LOOP VESSEL MAXI 1X406 RED (MISCELLANEOUS) ×1 IMPLANT
MANIFOLD NEPTUNE II (INSTRUMENTS) ×2 IMPLANT
MARKER SKIN DUAL TIP RULER LAB (MISCELLANEOUS) ×2 IMPLANT
NEEDLE SPNL 22GX3.5 QUINCKE BK (NEEDLE) ×2 IMPLANT
PACK BASIN MAJOR ARMC (MISCELLANEOUS) ×2 IMPLANT
RELOAD PROXIMATE TA60MM GREEN (ENDOMECHANICALS) IMPLANT
RELOAD STAPLER LINE PROX 30 GR (STAPLE) IMPLANT
SPONGE KITTNER 5P (MISCELLANEOUS) ×2 IMPLANT
STAPLE RELOAD 2.5MM WHITE (STAPLE) IMPLANT
STAPLER RELOAD LINE PROX 30 GR (STAPLE)
STAPLER SKIN PROX 35W (STAPLE) ×2 IMPLANT
STAPLER VASCULAR ECHELON 35 (CUTTER) IMPLANT
STRIP CLOSURE SKIN 1/2X4 (GAUZE/BANDAGES/DRESSINGS) ×2 IMPLANT
SUT ETHILON 3-0 FS-10 30 BLK (SUTURE) ×2
SUT MNCRL 4-0 (SUTURE)
SUT MNCRL 4-0 27XMFL (SUTURE)
SUT PROLENE 5 0 RB 1 DA (SUTURE) IMPLANT
SUT SILK 0 (SUTURE) ×1
SUT SILK 0 30XBRD TIE 6 (SUTURE) ×1 IMPLANT
SUT SILK 1 SH (SUTURE) ×6 IMPLANT
SUT VIC AB 0 CT1 36 (SUTURE) ×4 IMPLANT
SUT VIC AB 2-0 CT1 27 (SUTURE) ×1
SUT VIC AB 2-0 CT1 TAPERPNT 27 (SUTURE) ×1 IMPLANT
SUT VIC AB 2-0 SH 27 (SUTURE) ×1
SUT VIC AB 2-0 SH 27XBRD (SUTURE) ×1 IMPLANT
SUT VICRYL 0 AB UR-6 (SUTURE) ×2 IMPLANT
SUT VICRYL 2 TP 1 (SUTURE) ×2 IMPLANT
SUTURE EHLN 3-0 FS-10 30 BLK (SUTURE) ×1 IMPLANT
SUTURE MNCRL 4-0 27XMF (SUTURE) IMPLANT
SYR 10ML SLIP (SYRINGE) IMPLANT
SYR 20ML LL LF (SYRINGE) ×4 IMPLANT
SYR BULB IRRIG 60ML STRL (SYRINGE) ×2 IMPLANT
SYSTEM SAHARA CHEST DRAIN ATS (WOUND CARE) ×2 IMPLANT
TAPE CLOTH 3X10 WHT NS LF (GAUZE/BANDAGES/DRESSINGS) ×2 IMPLANT
TAPE TRANSPORE STRL 2 31045 (GAUZE/BANDAGES/DRESSINGS) IMPLANT
TRAY FOLEY MTR SLVR 16FR STAT (SET/KITS/TRAYS/PACK) ×2 IMPLANT
TROCAR FLEXIPATH 20X80 (ENDOMECHANICALS) ×2 IMPLANT
TROCAR FLEXIPATH THORACIC 15MM (ENDOMECHANICALS) IMPLANT
TUBING CONNECTING 10 (TUBING) ×2 IMPLANT
WATER STERILE IRR 1000ML POUR (IV SOLUTION) ×2 IMPLANT
YANKAUER SUCT BULB TIP FLEX NO (MISCELLANEOUS) ×2 IMPLANT

## 2020-04-27 NOTE — Anesthesia Preprocedure Evaluation (Signed)
Anesthesia Evaluation  Patient identified by MRN, date of birth, ID band Patient awake  General Assessment Comment:New onset 2-minute run of Afib with RVR last night, self resolved  Reviewed: Allergy & Precautions, NPO status , Patient's Chart, lab work & pertinent test results  History of Anesthesia Complications Negative for: history of anesthetic complications  Airway Mallampati: II  TM Distance: >3 FB Neck ROM: Full    Dental no notable dental hx. (+) Teeth Intact   Pulmonary shortness of breath and at rest, neg sleep apnea, COPD,  COPD inhaler and oxygen dependent, Patient abstained from smoking.Not current smoker, former smoker,  2L [place] at home  Recurrent R pneumothorax. Has chest tube in place. Breathing today feels a lot better than when he came into the hospital    + decreased breath sounds      Cardiovascular Exercise Tolerance: Good METShypertension, + CAD and + Peripheral Vascular Disease  (-) Past MI + dysrhythmias Atrial Fibrillation  Rhythm:Regular Rate:Normal - Systolic murmurs TTE 9476 unremarkable   Neuro/Psych negative neurological ROS  negative psych ROS   GI/Hepatic neg GERD  ,(+)     (-) substance abuse  ,   Endo/Other  neg diabetes  Renal/GU negative Renal ROS     Musculoskeletal  (+) Arthritis ,   Abdominal   Peds  Hematology   Anesthesia Other Findings Past Medical History: No date: Alcohol abuse, in remission     Comment:  remote  No date: Arthritis     Comment:  BACK AND NECK No date: Benign essential tremor     Comment:  improved on metoprolol and gabapentin 12/30/2012: BPH (benign prostatic hypertrophy) 03/2010: Cardiac arrhythmia     Comment:  h/o a flutter and CM, s/p ablation, normal stress test No date: Cataract     Comment:  bilat removed  09/23/2015: Chemotherapy-induced neutropenia (HCC) No date: Chronic low back pain     Comment:  MRI 2004, multilevel DDD No date: Colon  polyps     Comment:  next colonoscopy due around [age] age 03-54: Complication of anesthesia     Comment:  DID NOT GET COMPLETELY NUMB WITH TONSILLECTOMY No date: COPD (chronic obstructive pulmonary disease) (HCC) No date: Coronary artery disease No date: Dyslipidemia     Comment:  mild off meds No date: GERD (gastroesophageal reflux disease) No date: History of kidney stones     Comment:  h/o No date: HTN (hypertension) No date: Macular degeneration, bilateral 02/2015: Multiple pulmonary nodules No date: Neuromuscular disorder ([place])     Comment:  neuropathy from chemo....in feet 12/23/2015: Peripheral neuropathy due to chemotherapy [facility])     Comment:  Saw Dr Manuella Ghazi, on gabapentin and cymbalta with hydrocodone              PRN (03/2016) 02/18/2015: Personal history of tobacco use, presenting hazards to  health     Comment:  quit 06/2011, restarted 2014 04/01/2015: Pneumothorax after biopsy No date: Pre-diabetes 2016: Primary cancer of bladder [facility])     Comment:  Budzyn 2016: Primary lung cancer ([place])     Comment:  port a cath removed 03/2016 No date: Shortness of breath dyspnea     Comment:  OCC WITH EXERTION  Reproductive/Obstetrics                             Anesthesia Physical Anesthesia Plan  ASA: III  Anesthesia Plan: General   Post-op Pain Management:    Induction: Intravenous  PONV Risk Score and Plan: 3 and Ondansetron, Dexamethasone and Midazolam  Airway Management Planned: Double Lumen EBT and Video Laryngoscope Planned  Additional Equipment: None  Intra-op Plan:   Post-operative Plan: Extubation in OR and Possible Post-op intubation/ventilation  Informed Consent: I have reviewed the patients History and Physical, chart, labs and discussed the procedure including the risks, benefits and alternatives for the proposed anesthesia with the patient or authorized representative who has indicated his/her understanding and acceptance.      Dental advisory given  Plan Discussed with: CRNA and Surgeon  Anesthesia Plan Comments: (Discussed risks of anesthesia with patient, including PONV, sore throat, lip/dental damage. Rare risks discussed as well, such as cardiorespiratory and neurological sequelae. Discussed possible post op prolonged intubation. Patient understands.)        Anesthesia Quick Evaluation

## 2020-04-27 NOTE — Progress Notes (Signed)
PROGRESS NOTE    JOBE MUTCH  NTI:[id number] DOB: Feb 24, 1941 DOA: 04/26/2020 PCP: [name], MD   Assessment & Plan:   Principal Problem:   Recurrent pneumothorax after pleural drain removed Active Problems:   HTN (hypertension)   Coronary artery disease involving native coronary artery of native heart without angina pectoris   COPD (chronic obstructive pulmonary disease) ([place])   Malignant neoplasm of right upper lobe of lung (HCC)   Chronic low back pain (Primary area of Pain) (Bilateral)  (L>R) w/o sciatica   Acute on chronic respiratory failure with hypoxia (HCC)   Bullous emphysema (HCC)   Pneumothorax   Recurrent pneumothorax: after pleural drain removed. General surg following and recs apprec. Will undergo talc pleurodesis 04/27/20 as per general surg  Chronic hypoxic respiratory failure: continue on supplemental oxygen   COPD/bullous emphysema. W/o exacerbation. Continue on bronchodilators and encourage incentive spirometry   Lung cancer: undergoing radiation as an outpatient. Management per onco as an outpatient   Macrocytic anemia: no need for a transfusion at this time. Will continue to monitor   HTN: will restart home dose of metoprolol. Will continue to hold lisinopril.  CAD: stable. No chest pain. Continue on metoprolol. 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 Continue on gabapentin    DVT prophylaxis: SCDs Code Status: full  Family Communication:  Disposition Plan: likely d/c back home vs SNF, depends on PT/OT recs  Status is: Inpatient  Remains inpatient appropriate because:Ongoing diagnostic testing needed not appropriate for outpatient work up, Unsafe d/c plan, IV treatments appropriate due to intensity of illness or inability to take PO and Inpatient level of care appropriate due to severity of  illness   Dispo: The patient is from: Home              Anticipated d/c is to: Home              Anticipated d/c date is: 3 days              Patient currently is not medically stable to d/c.     Consultants:   General surg   Procedures: pleurodesis    Antimicrobials:    Subjective: Pt c/o malaise   Objective: Vitals:   04/26/20 1642 04/26/20 1900 04/26/20 2322 04/27/20 0401  BP: (!) 147/52 (!) 121/40 (!) 123/48 (!) 125/49  Pulse: 87 78 72 70  Resp: (!) 21 20 16 19   Temp: 98.3 F (36.8 C) (!) 97.5 F (36.4 C) 98 F (36.7 C) 98 F (36.7 C)  TempSrc: Oral Oral Oral Oral  SpO2: 96% 95% 96% 96%  Weight:      Height:        Intake/Output Summary (Last 24 hours) at 04/27/2020 0806 Last data filed at 04/27/2020 0400 Gross per 24 hour  Intake 320 ml  Output 66 ml  Net 254 ml   Filed Weights   04/25/20 2349  Weight: 83.9 kg    Examination:  General exam: Appears calm and comfortable  Respiratory system: diminished breath sounds b/l. No rales  Cardiovascular system: S1 & S2 +. No  rubs, gallops or clicks. Gastrointestinal system: Abdomen is nondistended, soft and nontender. Normal bowel sounds heard. Central nervous system: Alert and oriented. Moves all 4 extremities  Psychiatry: Judgement and insight appear normal. Mood & affect appropriate.     Data Reviewed: I have personally  reviewed following labs and imaging studies  CBC: Recent Labs  Lab 04/25/20 2354 04/27/20 0524  WBC 8.1 8.1  NEUTROABS  --  5.8  HGB 12.1* 10.9*  HCT 38.5* 35.7*  MCV 100.5* 103.2*  PLT 208 948   Basic Metabolic Panel: Recent Labs  Lab 04/25/20 2354 04/27/20 0524  NA 141 141  K 4.3 4.2  CL 101 95*  CO2 30 37*  GLUCOSE 145* 96  BUN 25* 22  CREATININE 0.53* 0.62  CALCIUM 8.2* 8.5*   GFR: Estimated Creatinine Clearance: 79 mL/min (by C-G formula based on SCr of 0.62 mg/dL). Liver Function Tests: No results for input(s): AST, ALT, ALKPHOS, BILITOT, PROT,  ALBUMIN in the last 168 hours. No results for input(s): LIPASE, AMYLASE in the last 168 hours. No results for input(s): AMMONIA in the last 168 hours. Coagulation Profile: No results for input(s): INR, PROTIME in the last 168 hours. Cardiac Enzymes: No results for input(s): CKTOTAL, CKMB, CKMBINDEX, TROPONINI in the last 168 hours. BNP (last 3 results) No results for input(s): PROBNP in the last 8760 hours. HbA1C: No results for input(s): HGBA1C in the last 72 hours. CBG: No results for input(s): GLUCAP in the last 168 hours. Lipid Profile: No results for input(s): CHOL, HDL, LDLCALC, TRIG, CHOLHDL, LDLDIRECT in the last 72 hours. Thyroid Function Tests: No results for input(s): TSH, T4TOTAL, FREET4, T3FREE, THYROIDAB in the last 72 hours. Anemia Panel: No results for input(s): VITAMINB12, FOLATE, FERRITIN, TIBC, IRON, RETICCTPCT in the last 72 hours. Sepsis Labs: No results for input(s): PROCALCITON, LATICACIDVEN in the last 168 hours.  Recent Results (from the past 240 hour(s))  Respiratory Panel by RT PCR (Flu A&B, Covid) - Nasopharyngeal Swab     Status: None   Collection Time: 04/26/20 11:24 AM   Specimen: Nasopharyngeal Swab  Result Value Ref Range Status   SARS Coronavirus 2 by RT PCR NEGATIVE NEGATIVE Final    Comment: (NOTE) SARS-CoV-2 target nucleic acids are NOT DETECTED.  The SARS-CoV-2 RNA is generally detectable in upper respiratoy specimens during the acute phase of infection. The lowest concentration of SARS-CoV-2 viral copies this assay can detect is 131 copies/mL. A negative result does not preclude SARS-Cov-2 infection and should not be used as the sole basis for treatment or other patient management decisions. A negative result may occur with  improper specimen collection/handling, submission of specimen other than nasopharyngeal swab, presence of viral mutation(s) within the areas targeted by this assay, and inadequate number of viral copies (<131  copies/mL). A negative result must be combined with clinical observations, patient history, and epidemiological information. The expected result is Negative.  Fact Sheet for Patients:  PinkCheek.be  Fact Sheet for Healthcare Providers:  GravelBags.it  This test is no t yet approved or cleared by the [place] FDA and  has been authorized for detection and/or diagnosis of SARS-CoV-2 by FDA under an Emergency Use Authorization (EUA). This EUA will remain  in effect (meaning this test can be used) for the duration of the COVID-19 declaration under Section 564(b)(1) of the Act, 21 U.S.C. section 360bbb-3(b)(1), unless the authorization is terminated or revoked sooner.     Influenza A by PCR NEGATIVE NEGATIVE Final   Influenza B by PCR NEGATIVE NEGATIVE Final    Comment: (NOTE) The Xpert Xpress SARS-CoV-2/FLU/RSV assay is intended as an aid in  the diagnosis of influenza from Nasopharyngeal swab specimens and  should not be used as a sole basis for treatment. Nasal washings and  aspirates are  unacceptable for Xpert Xpress SARS-CoV-2/FLU/RSV  testing.  Fact Sheet for Patients: PinkCheek.be  Fact Sheet for Healthcare Providers: GravelBags.it  This test is not yet approved or cleared by the [place] FDA and  has been authorized for detection and/or diagnosis of SARS-CoV-2 by  FDA under an Emergency Use Authorization (EUA). This EUA will remain  in effect (meaning this test can be used) for the duration of the  Covid-19 declaration under Section 564(b)(1) of the Act, 21  U.S.C. section 360bbb-3(b)(1), unless the authorization is  terminated or revoked. Performed at [facility], [address], [place], [place] [address]          Radiology Studies: DG Chest 2 View  Result Date: 04/26/2020 CLINICAL DATA:  Shortness of breath EXAM: CHEST - 2  VIEW COMPARISON:  04/18/2020 FINDINGS: Recurrent right pneumothorax measuring approximately 18 mm at the lateral right chest. Left lung is clear. No mediastinal shift. Normal cardiomediastinal contours. IMPRESSION: Recurrent right pneumothorax, measuring approximately 18 mm. These results were called by telephone at the time of interpretation on 04/26/2020 at 12:41 am to provider [facility] , who verbally acknowledged these results. Electronically Signed   By: Ulyses Jarred M.D.   On: 04/26/2020 00:42   CT IMAGE GUIDED DRAINAGE BY PERCUTANEOUS CATHETER  Result Date: 04/26/2020 INDICATION: 79 year old with a recurrent right pneumothorax. History of lung cancer. Request for new chest tube placement. EXAM: CT-GUIDED PLACEMENT OF RIGHT CHEST TUBE MEDICATIONS: None ANESTHESIA/SEDATION: None The patient was continuously monitored during the procedure by the interventional radiology nurse under my direct supervision. COMPLICATIONS: None immediate. TECHNIQUE: The procedure was explained to the patient. The risks and benefits of the procedure were discussed and the patient's questions were addressed. Informed consent was obtained from the patient. Time-out was performed. Patient was placed on his left side. CT images through the chest were obtained. Right side of the chest was prepped with chlorhexidine and sterile field was created. Maximal barrier sterile technique was utilized including caps, mask, sterile gowns, sterile gloves, sterile drape, hand hygiene and skin antiseptic. PROCEDURE: Large pneumothorax was identified with CT imaging. Right mid axillary region was prepped and draped in sterile fashion. Skin was anesthetized using 1% lidocaine. Small incision was made. Yueh catheter was directed into the pleural space while aspirating. A superstiff Amplatz wire was advanced into the pleural space and position needle position was confirmed with CT. The Yueh catheter was removed over the wire and the tract was  dilated to accommodate a 16 French pigtail drain. Drain was easily advanced over the wire. Drain was attached to chest drainage system and wall suction. Follow up CT images were obtained. Catheter was sutured to skin and a dressing was placed. FINDINGS: Large right pneumothorax. 53 French drain was placed and pneumothorax markedly decreased in size after chest tube placement. Small residual right pneumothorax at the end of the procedure. Again noted is a rounded lesion in the right lower lobe that measures up to 2 2.0 cm. IMPRESSION: CT-guided placement of right chest tube. Electronically Signed   By: Markus Daft M.D.   On: 04/26/2020 14:52        Scheduled Meds: . gabapentin  300 mg Oral TID   And  . gabapentin  600 mg Oral TID  . pantoprazole  40 mg Oral Daily  . polyethylene glycol  17 g Oral Daily  . primidone  150 mg Oral BID  . rosuvastatin  10 mg Oral QHS   Continuous Infusions:   LOS: 1  day    Time spent: 30 mins    Wyvonnia Dusky, MD [facility] Pager [phone]  If 7PM-7AM, please contact night-coverage [email or web address] Password [email or web address] 04/27/2020, 8:06 AM

## 2020-04-27 NOTE — Anesthesia Procedure Notes (Signed)
Procedure Name: Intubation Date/Time: 04/27/2020 10:18 AM Performed by: Hedda Slade, CRNA Pre-anesthesia Checklist: Patient identified, Patient being monitored, Timeout performed, Emergency Drugs available and Suction available Patient Re-evaluated:Patient Re-evaluated prior to induction Oxygen Delivery Method: Circle system utilized Preoxygenation: Pre-oxygenation with 100% oxygen Induction Type: IV induction Ventilation: Mask ventilation without difficulty and Oral airway inserted - appropriate to patient size Laryngoscope Size: McGraph and 4 Grade View: Grade I Endobronchial tube: Left, Double lumen EBT and EBT position confirmed by fiberoptic bronchoscope and 39 Fr Number of attempts: 1 Airway Equipment and Method: Stylet and Video-laryngoscopy Placement Confirmation: ETT inserted through vocal cords under direct vision,  positive ETCO2 and breath sounds checked- equal and bilateral Secured at: 31 cm Tube secured with: Tape Dental Injury: Teeth and Oropharynx as per pre-operative assessment

## 2020-04-27 NOTE — OR Nursing (Signed)
Charge nurse Eve spoke with Dr. Bertell Maria, [place], & [place].  May go to progressive care.  Coughing up white mucus.

## 2020-04-27 NOTE — OR Nursing (Signed)
Respiratory called to take patient of BIPAP, done.

## 2020-04-27 NOTE — Op Note (Signed)
  04/27/2020  1:24 PM  PATIENT:  Joseph Graham  79 y.o. male  PRE-OPERATIVE DIAGNOSIS: Recurrent right-sided pneumothorax  POST-OPERATIVE DIAGNOSIS: Recurrent right-sided pneumothorax  PROCEDURE: Right thoracoscopy with talc pleurodesis  SURGEON:  Surgeon(s) and Role:    [name], MD - Primary  ASSISTANTS: None  ANESTHESIA: General  INDICATIONS FOR PROCEDURE Mr. Carrick is a 80 year old gentleman who is currently undergoing radiation therapy for a right-sided lung cancer.  He has a prior history of lung cancer and has significant COPD.  He had an initial pneumothorax several years ago at the time of his original diagnosis but over the last several weeks has suffered 2 additional pneumothoraces on the right.  These have been managed with percutaneous drainage.  He was apprised of the indications and risks of surgery and he gives informed consent.  DICTATION: The patient was brought to the operating suite and placed in the supine position.  General endotracheal anesthesia was given through a double-lumen tube.  Preoperative bronchoscopy confirmed the tube to be in the proper position.  Patient was then turned for right thoracoscopy.  All pressure points were carefully padded.  The previous chest tube was removed.  The right lung was deflated just prior to removal of the tube.  The patient was then prepped and draped in usual sterile fashion.  We made a 20 mm long incision just posterior and inferior to the previous chest tube.  Incision was deepened down through the muscles of the chest wall until the pleural space was entered.  Once the pleural space was entered we then placed a 30 degree scope and had a good visualization of the entire hemithorax.  There is no evidence of intrapleural tumor.  There were broad swaths on the upper lobe where the lung was adhered to the chest wall.  These were not taken down.  4 g of talc were then insufflated under direct visualization coating the  pleural surfaces.  A single 2 French chest tube was inserted through our thoracoscopy port and brought out through a separate stab wound.  The lung was then reinflated and the wounds were closed with multiple layers running absorbable sutures.  The chest tube was secured with silk.  Sterile dressings were applied.  The patient was then rolled in the supine position where he was extubated.  After extubation he was still somewhat somnolent and required some BiPAP in the operating room and in the recovery room.  Ultimately he was transported to the recovery room in stable condition.   [name], MD

## 2020-04-27 NOTE — Transfer of Care (Signed)
Immediate Anesthesia Transfer of Care Note  Patient: Joseph Graham  Procedure(s) Performed: VIDEO ASSISTED THORACOSCOPY (VATS) W/TALC PLEUADESIS (Right )  Patient Location: PACU  Anesthesia Type:General  Level of Consciousness: sedated  Airway & Oxygen Therapy: Patient Spontanous Breathing and Patient connected to face mask oxygen BiPAP  Post-op Assessment: Report given to RN and Post -op Vital signs reviewed and stable  Post vital signs: Reviewed and stable  Last Vitals:  Vitals Value Taken Time  BP 155/64 04/27/20 1230  Temp 36.8 C 04/27/20 1221  Pulse 82 04/27/20 1234  Resp 18 04/27/20 1234  SpO2 89 % 04/27/20 1234  Vitals shown include unvalidated device data.  Last Pain:  Vitals:   04/27/20 0920  TempSrc:   PainSc: 5          Complications: No complications documented.

## 2020-04-28 ENCOUNTER — Encounter: Payer: Self-pay | Admitting: Cardiothoracic Surgery

## 2020-04-28 ENCOUNTER — Ambulatory Visit: Payer: Medicare Other

## 2020-04-28 ENCOUNTER — Inpatient Hospital Stay: Payer: Medicare Other

## 2020-04-28 DIAGNOSIS — C3411 Malignant neoplasm of upper lobe, right bronchus or lung: Secondary | ICD-10-CM | POA: Diagnosis not present

## 2020-04-28 DIAGNOSIS — I10 Essential (primary) hypertension: Secondary | ICD-10-CM | POA: Diagnosis not present

## 2020-04-28 DIAGNOSIS — J95811 Postprocedural pneumothorax: Secondary | ICD-10-CM | POA: Diagnosis not present

## 2020-04-28 LAB — CBC
HCT: 39.5 % (ref 39.0–52.0)
Hemoglobin: 12.1 g/dL — ABNORMAL LOW (ref 13.0–17.0)
MCH: 31.8 pg (ref 26.0–34.0)
MCHC: 30.6 g/dL (ref 30.0–36.0)
MCV: 103.9 fL — ABNORMAL HIGH (ref 80.0–100.0)
Platelets: 190 10*3/uL (ref 150–400)
RBC: 3.8 MIL/uL — ABNORMAL LOW (ref 4.22–5.81)
RDW: 13.2 % (ref 11.5–15.5)
WBC: 13.7 10*3/uL — ABNORMAL HIGH (ref 4.0–10.5)
nRBC: 0 % (ref 0.0–0.2)

## 2020-04-28 LAB — BASIC METABOLIC PANEL
Anion gap: 10 (ref 5–15)
BUN: 23 mg/dL (ref 8–23)
CO2: 36 mmol/L — ABNORMAL HIGH (ref 22–32)
Calcium: 8.1 mg/dL — ABNORMAL LOW (ref 8.9–10.3)
Chloride: 95 mmol/L — ABNORMAL LOW (ref 98–111)
Creatinine, Ser: 0.75 mg/dL (ref 0.61–1.24)
GFR, Estimated: >60 mL/min (ref >60)
Glucose, Bld: 148 mg/dL — ABNORMAL HIGH (ref 70–99)
Potassium: 4.3 mmol/L (ref 3.5–5.1)
Sodium: 141 mmol/L (ref 135–145)

## 2020-04-28 LAB — BLOOD GAS, ARTERIAL
Acid-Base Excess: 11.3 mmol/L — ABNORMAL HIGH (ref 0.0–2.0)
Bicarbonate: 43.7 mmol/L — ABNORMAL HIGH (ref 20.0–28.0)
FIO2: 0.6
O2 Saturation: 97.4 %
Patient temperature: 37
pCO2 arterial: 102 mmHg (ref 32.0–48.0)
pH, Arterial: 7.24 — ABNORMAL LOW (ref 7.350–7.450)
pO2, Arterial: 110 mmHg — ABNORMAL HIGH (ref 83.0–108.0)

## 2020-04-28 MED ORDER — METOPROLOL TARTRATE 25 MG PO TABS
12.5000 mg | ORAL_TABLET | Freq: Two times a day (BID) | ORAL | Status: DC
  Administered 2020-04-28 – 2020-05-03 (×9): 12.5 mg via ORAL
  Filled 2020-04-28 (×10): qty 1

## 2020-04-28 MED ORDER — LORAZEPAM 2 MG/ML IJ SOLN
0.5000 mg | Freq: Once | INTRAMUSCULAR | Status: AC | PRN
  Administered 2020-04-28: 0.5 mg via INTRAVENOUS
  Filled 2020-04-28: qty 1

## 2020-04-28 NOTE — Care Management Important Message (Signed)
Important Message  Patient Details  Name: Joseph Graham MRN: [id number] Date of Birth: 09-11-1940   Medicare Important Message Given:     Initial Medicare IM reviewed with spouse, Norton Blizzard by Meredith Mody, Patient Access Associate on 04/28/2020 at 10:05am.   Dannette Barbara 04/28/2020, 3:14 PM

## 2020-04-28 NOTE — Progress Notes (Signed)
o2 sats sustaining low 80s on 6LNC- pt arouses to voice  but lethargic / Resp called to beside/ MD made aware/ 10L HFNC applied -sats improved / ABG obtained / orders to apply BIPAP/ BIPAP applied by Resp/ 02 sats 96%/ pt resting comfortable/ will continue to monitor.

## 2020-04-28 NOTE — Anesthesia Postprocedure Evaluation (Signed)
Anesthesia Post Note  Patient: Joseph Graham  Procedure(s) Performed: VIDEO ASSISTED THORACOSCOPY (VATS) W/TALC PLEUADESIS (Right )  Patient location during evaluation: PACU Anesthesia Type: General Level of consciousness: awake and alert Pain management: pain level controlled Vital Signs Assessment: post-procedure vital signs reviewed and stable Respiratory status: spontaneous breathing, nonlabored ventilation, respiratory function stable and patient connected to nasal cannula oxygen Cardiovascular status: blood pressure returned to baseline and stable Postop Assessment: no apparent nausea or vomiting Anesthetic complications: no   No complications documented.   Last Vitals:  Vitals:   04/28/20 0410 04/28/20 0500  BP: (!) 141/53   Pulse: 90   Resp: 18   Temp: 36.4 C   SpO2: (!) 84% 92%    Last Pain:  Vitals:   04/28/20 0410  TempSrc: Oral  PainSc:                  Arita Miss

## 2020-04-28 NOTE — Progress Notes (Signed)
Patient ID: Joseph Graham, male   DOB: 10-Jul-1940, 79 y.o.   MRN: [id number]   He remains somewhat confused this morning.  His only complaints were that of the pulse oximeter on his ear.  He did not complain of any significant pain.  There is no air leak from the chest tubes.  There is minimal drainage.  I have independently reviewed his chest x-ray.  Overall it looks as expected postoperatively.  I will leave his chest tube to suction for a minimum of 48 hours.  At that point we can then place the tubes to waterseal if there is no air leak and the chest x-ray looks good.

## 2020-04-28 NOTE — Progress Notes (Signed)
PROGRESS NOTE    Joseph Graham  IOE:[id number] DOB: February 14, 1941 DOA: 04/26/2020 PCP: [name], MD   Assessment & Plan:   Principal Problem:   Recurrent pneumothorax after pleural drain removed Active Problems:   HTN (hypertension)   Coronary artery disease involving native coronary artery of native heart without angina pectoris   COPD (chronic obstructive pulmonary disease) ([place])   Malignant neoplasm of right upper lobe of lung (HCC)   Chronic low back pain (Primary area of Pain) (Bilateral)  (L>R) w/o sciatica   Acute on chronic respiratory failure with hypoxia (HCC)   Bullous emphysema (HCC)   Pneumothorax   Recurrent pneumothorax: continue w/ chest tube as per general surg. General surg following and recs apprec. S/p talc pleurodesis 04/27/20 as per general surg  Chronic hypoxic respiratory failure: continue on supplemental oxygen   Acute hypercarbic respiratory failure: as well today. CO2 on ABG 102. Placed on BiPAP   COPD/bullous emphysema. W/o exacerbation. Continue on bronchodilators and encourage incentive spirometry   Lung cancer: undergoing radiation as an outpatient. Management per onco as an outpatient   Macrocytic anemia: no need for a transfusion currently. Will continue to monitor   HTN: will restart home dose of metoprolol. Continue to hold home dose of lisinopril   CAD: stable. No chest pain. Continue on metoprolol. Continue to hold lisinopril  Chronic low back pain: norco prn   Benign essential tremor: continue on home dose of primidone   Peripheral neuropathy: likely secondary to chemo. Continue on gabapentin    DVT prophylaxis: SCDs Code Status: full  Family Communication: discussed pt's care w/ family at bedside and answered their questions  Disposition Plan: likely d/c back home vs SNF, depends on PT/OT recs  Status is: Inpatient  Remains inpatient appropriate because:Ongoing diagnostic testing needed not appropriate for outpatient  work up, Unsafe d/c plan, IV treatments appropriate due to intensity of illness or inability to take PO and Inpatient level of care appropriate due to severity of illness   Dispo: The patient is from: Home              Anticipated d/c is to: Home              Anticipated d/c date is: 3 days              Patient currently is not medically stable to d/c.     Consultants:   General surg   Procedures: pleurodesis    Antimicrobials:    Subjective: Pt c/o fatigue  Objective: Vitals:   04/27/20 1800 04/27/20 1959 04/28/20 0410 04/28/20 0500  BP:  (!) 119/42 (!) 141/53   Pulse:  79 90   Resp:  18 18   Temp:  98.5 F (36.9 C) 97.6 F (36.4 C)   TempSrc:  Oral Oral   SpO2: 95% 93% (!) 84% 92%  Weight:   88.3 kg   Height:        Intake/Output Summary (Last 24 hours) at 04/28/2020 0834 Last data filed at 04/28/2020 0553 Gross per 24 hour  Intake 388.31 ml  Output 86 ml  Net 302.31 ml   Filed Weights   04/25/20 2349 04/27/20 1547 04/28/20 0410  Weight: 83.9 kg 94.9 kg 88.3 kg    Examination:  General exam: Appears lethargic  Respiratory system: decreased breath sounds b/l.   Cardiovascular system: S1/S2+. No rubs or gallops  Gastrointestinal system: Abdomen is nondistended, soft and nontender. Hypoactive bowel sounds  Central nervous system: Lethargic. Moves all  4 extremities  Psychiatry: Judgement and insight appear normal. Flat mood and affect.     Data Reviewed: I have personally reviewed following labs and imaging studies  CBC: Recent Labs  Lab 04/25/20 2354 04/27/20 0524 04/27/20 1312 04/28/20 0543  WBC 8.1 8.1 10.1 13.7*  NEUTROABS  --  5.8  --   --   HGB 12.1* 10.9* 12.4* 12.1*  HCT 38.5* 35.7* 40.4 39.5  MCV 100.5* 103.2* 103.3* 103.9*  PLT 208 191 189 914   Basic Metabolic Panel: Recent Labs  Lab 04/25/20 2354 04/27/20 0524 04/27/20 1312 04/28/20 0543  NA 141 141 141 141  K 4.3 4.2 4.3 4.3  CL 101 95* 98 95*  CO2 30 37* 33* 36*   GLUCOSE 145* 96 133* 148*  BUN 25* 22 20 23   CREATININE 0.53* 0.62 0.62 0.75  CALCIUM 8.2* 8.5* 7.9* 8.1*   GFR: Estimated Creatinine Clearance: 82.3 mL/min (by C-G formula based on SCr of 0.75 mg/dL). Liver Function Tests: No results for input(s): AST, ALT, ALKPHOS, BILITOT, PROT, ALBUMIN in the last 168 hours. No results for input(s): LIPASE, AMYLASE in the last 168 hours. No results for input(s): AMMONIA in the last 168 hours. Coagulation Profile: No results for input(s): INR, PROTIME in the last 168 hours. Cardiac Enzymes: No results for input(s): CKTOTAL, CKMB, CKMBINDEX, TROPONINI in the last 168 hours. BNP (last 3 results) No results for input(s): PROBNP in the last 8760 hours. HbA1C: No results for input(s): HGBA1C in the last 72 hours. CBG: No results for input(s): GLUCAP in the last 168 hours. Lipid Profile: No results for input(s): CHOL, HDL, LDLCALC, TRIG, CHOLHDL, LDLDIRECT in the last 72 hours. Thyroid Function Tests: No results for input(s): TSH, T4TOTAL, FREET4, T3FREE, THYROIDAB in the last 72 hours. Anemia Panel: No results for input(s): VITAMINB12, FOLATE, FERRITIN, TIBC, IRON, RETICCTPCT in the last 72 hours. Sepsis Labs: No results for input(s): PROCALCITON, LATICACIDVEN in the last 168 hours.  Recent Results (from the past 240 hour(s))  Respiratory Panel by RT PCR (Flu A&B, Covid) - Nasopharyngeal Swab     Status: None   Collection Time: 04/26/20 11:24 AM   Specimen: Nasopharyngeal Swab  Result Value Ref Range Status   SARS Coronavirus 2 by RT PCR NEGATIVE NEGATIVE Final    Comment: (NOTE) SARS-CoV-2 target nucleic acids are NOT DETECTED.  The SARS-CoV-2 RNA is generally detectable in upper respiratoy specimens during the acute phase of infection. The lowest concentration of SARS-CoV-2 viral copies this assay can detect is 131 copies/mL. A negative result does not preclude SARS-Cov-2 infection and should not be used as the sole basis for treatment  or other patient management decisions. A negative result may occur with  improper specimen collection/handling, submission of specimen other than nasopharyngeal swab, presence of viral mutation(s) within the areas targeted by this assay, and inadequate number of viral copies (<131 copies/mL). A negative result must be combined with clinical observations, patient history, and epidemiological information. The expected result is Negative.  Fact Sheet for Patients:  PinkCheek.be  Fact Sheet for Healthcare Providers:  GravelBags.it  This test is no t yet approved or cleared by the [place] FDA and  has been authorized for detection and/or diagnosis of SARS-CoV-2 by FDA under an Emergency Use Authorization (EUA). This EUA will remain  in effect (meaning this test can be used) for the duration of the COVID-19 declaration under Section 564(b)(1) of the Act, 21 U.S.C. section 360bbb-3(b)(1), unless the authorization is terminated or revoked sooner.  Influenza A by PCR NEGATIVE NEGATIVE Final   Influenza B by PCR NEGATIVE NEGATIVE Final    Comment: (NOTE) The Xpert Xpress SARS-CoV-2/FLU/RSV assay is intended as an aid in  the diagnosis of influenza from Nasopharyngeal swab specimens and  should not be used as a sole basis for treatment. Nasal washings and  aspirates are unacceptable for Xpert Xpress SARS-CoV-2/FLU/RSV  testing.  Fact Sheet for Patients: PinkCheek.be  Fact Sheet for Healthcare Providers: GravelBags.it  This test is not yet approved or cleared by the [place] FDA and  has been authorized for detection and/or diagnosis of SARS-CoV-2 by  FDA under an Emergency Use Authorization (EUA). This EUA will remain  in effect (meaning this test can be used) for the duration of the  Covid-19 declaration under Section 564(b)(1) of the Act, 21  U.S.C.  section 360bbb-3(b)(1), unless the authorization is  terminated or revoked. Performed at [facility], [address], [place], [place] [address]          Radiology Studies: DG Chest [place] 1 View  Result Date: 04/28/2020 CLINICAL DATA:  Post VATS and pleurodesis with recurrence RIGHT sided pneumothorax EXAM: PORTABLE CHEST 1 VIEW COMPARISON:  04/27/2020 FINDINGS: RIGHT-sided chest tube remains in place. Unchanged with respect position. Cardiomediastinal contours are stable. The tracheal air column is unremarkable. Question small apical pneumothorax, approximately 2-3 mm. This may represent overlapping areas of lung disease. a there is also a question of deep sulcus on the RIGHT which may indicate a small basilar component as well appearing slightly different from the previous assessment. Diffuse interstitial and airspace opacities throughout the RIGHT and LEFT chest persist with similar appearance. On limited assessment no acute skeletal abnormality. IMPRESSION: 1. Question small 2-3 mm apical pneumothorax and possible small basilar component. Attention on follow-up. RIGHT chest tube remains in place. 2. Unchanged diffuse interstitial and airspace opacities throughout the RIGHT and LEFT chest. 3. No interval change in position of RIGHT chest tube. These results will be called to the ordering clinician or representative by the Radiologist Assistant, and communication documented in the PACS or [employer]. Electronically Signed   By: Zetta Bills M.D.   On: 04/28/2020 08:26   DG Chest [place] 1 View  Result Date: 04/27/2020 CLINICAL DATA:  Post op VATS.  Talc pleurodesis. EXAM: PORTABLE CHEST 1 VIEW COMPARISON:  Radiographs 04/26/2020 and 04/18/2020.  CT 04/16/2020. FINDINGS: 1232 hours. The heart size and mediastinal contours are stable. A right chest tube has been placed, projecting to the lower right paratracheal region. Surgical clips at the right lung apex and volume loss in the  right hemithorax are unchanged. There is no residual pneumothorax. There are new diffuse left greater than right airspace opacities which most likely represent edema. No significant pleural effusion. IMPRESSION: 1. No evidence of residual pneumothorax following right chest tube placement. 2. New diffuse left greater than right airspace opacities most likely represent edema. Atypical infection less likely. Electronically Signed   By: Richardean Sale M.D.   On: 04/27/2020 14:54   CT IMAGE GUIDED DRAINAGE BY PERCUTANEOUS CATHETER  Result Date: 04/26/2020 INDICATION: 79 year old with a recurrent right pneumothorax. History of lung cancer. Request for new chest tube placement. EXAM: CT-GUIDED PLACEMENT OF RIGHT CHEST TUBE MEDICATIONS: None ANESTHESIA/SEDATION: None The patient was continuously monitored during the procedure by the interventional radiology nurse under my direct supervision. COMPLICATIONS: None immediate. TECHNIQUE: The procedure was explained to the patient. The risks and benefits of the procedure were discussed and the patient's questions  were addressed. Informed consent was obtained from the patient. Time-out was performed. Patient was placed on his left side. CT images through the chest were obtained. Right side of the chest was prepped with chlorhexidine and sterile field was created. Maximal barrier sterile technique was utilized including caps, mask, sterile gowns, sterile gloves, sterile drape, hand hygiene and skin antiseptic. PROCEDURE: Large pneumothorax was identified with CT imaging. Right mid axillary region was prepped and draped in sterile fashion. Skin was anesthetized using 1% lidocaine. Small incision was made. Yueh catheter was directed into the pleural space while aspirating. A superstiff Amplatz wire was advanced into the pleural space and position needle position was confirmed with CT. The Yueh catheter was removed over the wire and the tract was dilated to accommodate a 16  French pigtail drain. Drain was easily advanced over the wire. Drain was attached to chest drainage system and wall suction. Follow up CT images were obtained. Catheter was sutured to skin and a dressing was placed. FINDINGS: Large right pneumothorax. 40 French drain was placed and pneumothorax markedly decreased in size after chest tube placement. Small residual right pneumothorax at the end of the procedure. Again noted is a rounded lesion in the right lower lobe that measures up to 2 2.0 cm. IMPRESSION: CT-guided placement of right chest tube. Electronically Signed   By: Markus Daft M.D.   On: 04/26/2020 14:52        Scheduled Meds:  gabapentin  300 mg Oral TID   And   gabapentin  600 mg Oral TID   pantoprazole  40 mg Oral Daily   polyethylene glycol  17 g Oral Daily   primidone  150 mg Oral BID   rosuvastatin  10 mg Oral QHS   traMADol  50-100 mg Oral Q6H   Continuous Infusions:   LOS: 2 days    Time spent: 33 mins    Wyvonnia Dusky, MD [facility] Pager [phone]  If 7PM-7AM, please contact night-coverage [email or web address] Password [facility] 04/28/2020, 8:34 AM

## 2020-04-29 ENCOUNTER — Inpatient Hospital Stay: Payer: Medicare Other | Admitting: Hospice and Palliative Medicine

## 2020-04-29 DIAGNOSIS — I10 Essential (primary) hypertension: Secondary | ICD-10-CM | POA: Diagnosis not present

## 2020-04-29 DIAGNOSIS — J95811 Postprocedural pneumothorax: Secondary | ICD-10-CM | POA: Diagnosis not present

## 2020-04-29 DIAGNOSIS — C3411 Malignant neoplasm of upper lobe, right bronchus or lung: Secondary | ICD-10-CM | POA: Diagnosis not present

## 2020-04-29 LAB — CBC
HCT: 33.4 % — ABNORMAL LOW (ref 39.0–52.0)
Hemoglobin: 10.5 g/dL — ABNORMAL LOW (ref 13.0–17.0)
MCH: 32.2 pg (ref 26.0–34.0)
MCHC: 31.4 g/dL (ref 30.0–36.0)
MCV: 102.5 fL — ABNORMAL HIGH (ref 80.0–100.0)
Platelets: 168 10*3/uL (ref 150–400)
RBC: 3.26 MIL/uL — ABNORMAL LOW (ref 4.22–5.81)
RDW: 13.2 % (ref 11.5–15.5)
WBC: 8.6 10*3/uL (ref 4.0–10.5)
nRBC: 0 % (ref 0.0–0.2)

## 2020-04-29 LAB — BASIC METABOLIC PANEL
Anion gap: 10 (ref 5–15)
BUN: 23 mg/dL (ref 8–23)
CO2: 36 mmol/L — ABNORMAL HIGH (ref 22–32)
Calcium: 8.2 mg/dL — ABNORMAL LOW (ref 8.9–10.3)
Chloride: 95 mmol/L — ABNORMAL LOW (ref 98–111)
Creatinine, Ser: 0.72 mg/dL (ref 0.61–1.24)
GFR, Estimated: >60 mL/min (ref >60)
Glucose, Bld: 133 mg/dL — ABNORMAL HIGH (ref 70–99)
Potassium: 4.7 mmol/L (ref 3.5–5.1)
Sodium: 141 mmol/L (ref 135–145)

## 2020-04-29 NOTE — Progress Notes (Signed)
[facility] Day(s): 3.   Post op day(s): 2 Days Post-Op.   Interval History: Patient seen and examined, no acute events or new complaints overnight.    Vital signs in last 24 hours: [min-max] current  Temp:  [97.7 F (36.5 C)-99.1 F (37.3 C)] 97.7 F (36.5 C) (11/19 0734) Pulse Rate:  [75-80] 77 (11/19 0734) Resp:  [17-20] 20 (11/19 0734) BP: (118-155)/(54-72) 155/58 (11/19 0925) SpO2:  [94 %-98 %] 98 % (11/19 0734) Weight:  [91.4 kg] 91.4 kg (11/19 0454)     Height: 5\' 9"  (175.3 cm) Weight: 91.4 kg BMI (Calculated): 29.74   Intake/Output last 2 shifts:  11/18 0701 - 11/19 0700 In: 240 [P.O.:240] Out: 20 [Chest Tube:20]   Physical Exam:  Constitutional: alert, cooperative and no distress  Respiratory: breathing non-labored at present, CT w/o air leak, minimal output.  Cardiovascular: regular rate and sinus rhythm  Gastrointestinal: soft, non-tender, and non-distended  Labs:  CBC Latest Ref Rng & Units 04/29/2020 04/28/2020 04/27/2020  WBC 4.0 - 10.5 K/uL 8.6 13.7(H) 10.1  Hemoglobin 13.0 - 17.0 g/dL 10.5(L) 12.1(L) 12.4(L)  Hematocrit 39 - 52 % 33.4(L) 39.5 40.4  Platelets 150 - 400 K/uL 168 190 189   CMP Latest Ref Rng & Units 04/29/2020 04/28/2020 04/27/2020  Glucose 70 - 99 mg/dL 133(H) 148(H) 133(H)  BUN 8 - 23 mg/dL 23 23 20   Creatinine 0.61 - 1.24 mg/dL 0.72 0.75 0.62  Sodium 135 - 145 mmol/L 141 141 141  Potassium 3.5 - 5.1 mmol/L 4.7 4.3 4.3  Chloride 98 - 111 mmol/L 95(L) 95(L) 98  CO2 22 - 32 mmol/L 36(H) 36(H) 33(H)  Calcium 8.9 - 10.3 mg/dL 8.2(L) 8.1(L) 7.9(L)  Total Protein 6.5 - 8.1 g/dL - - -  Total Bilirubin 0.3 - 1.2 mg/dL - - -  Alkaline Phos 38 - 126 U/L - - -  AST 15 - 41 U/L - - -  ALT 0 - 44 U/L - - -     Imaging studies: CLINICAL DATA:  Post VATS and pleurodesis with recurrence RIGHT sided pneumothorax  EXAM: PORTABLE CHEST 1 VIEW  COMPARISON:   04/27/2020  FINDINGS: RIGHT-sided chest tube remains in place. Unchanged with respect position.  Cardiomediastinal contours are stable. The tracheal air column is unremarkable.  Question small apical pneumothorax, approximately 2-3 mm. This may represent overlapping areas of lung disease. a there is also a question of deep sulcus on the RIGHT which may indicate a small basilar component as well appearing slightly different from the previous assessment.  Diffuse interstitial and airspace opacities throughout the RIGHT and LEFT chest persist with similar appearance.  On limited assessment no acute skeletal abnormality.  IMPRESSION: 1. Question small 2-3 mm apical pneumothorax and possible small basilar component. Attention on follow-up. RIGHT chest tube remains in place. 2. Unchanged diffuse interstitial and airspace opacities throughout the RIGHT and LEFT chest. 3. No interval change in position of RIGHT chest tube.  These results will be called to the ordering clinician or representative by the Radiologist Assistant, and communication documented in the PACS or [employer].   Electronically Signed   By: Zetta Bills M.D.   On: 04/28/2020 08:26  No new pertinent imaging studies   Assessment/Plan: -79 y.o. male  2 Days Post-Op s/p right VATS, pleurodeisis for ptx, complicated by pertinent comorbidities including:  Patient Active Problem List   Diagnosis Date Noted  . Recurrent pneumothorax after pleural drain removed 04/26/2020  . Chronic  respiratory failure with hypoxia ([place]) 04/26/2020  . Acute on chronic respiratory failure with hypoxia ([place]) 04/26/2020  . Bullous emphysema ([place]) 04/26/2020  . Pneumothorax 04/26/2020  . Pneumothorax on right 04/15/2020  . Grade 1 Retrolisthesis of L2/L3 11/25/2019  . Abnormal CT scan, lumbar spine (01/22/2019) 11/25/2019  . Osteoarthritis involving multiple joints 08/18/2019  . Rhinitis medicamentosa 07/14/2019  .  Right bundle branch block (RBBB) with left anterior hemiblock 01/21/2019  . Other specified spondylopathies, sacral and sacrococcygeal region ([place]) 01/15/2019  . Spondylosis without myelopathy or radiculopathy, lumbosacral region 08/28/2018  . Abnormal MRI, lumbar spine (06/12/2018) 08/20/2018  . DDD (degenerative disc disease), lumbosacral 08/07/2018  . Coccygodynia 08/07/2018  . Lumbar facet hypertrophy (Multilevel) (Bilateral) 08/04/2018  . Osteoarthritis of facet joint of lumbar spine 08/04/2018  . Lumbar spondylosis 08/04/2018  . Lumbar facet syndrome (Bilateral) (L>R) 08/04/2018  . Lumbar lateral recess stenosis (L2-3, L3-4, L4-5) (Right) 08/04/2018  . Lumbar foraminal stenosis (Right: L2-3, L3-4) (Bilateral: L4-5) 08/04/2018  . Chronic low back pain (Primary area of Pain) (Bilateral)  (L>R) w/o sciatica 07/14/2018  . Chronic coccyx pain (Secondary Area of Pain) ([place]) 07/14/2018  . Chronic lower extremity pain [facility] Area of Pain) (Bilateral) (R>L) 07/14/2018  . Chronic pain syndrome 07/14/2018  . Long term current use of opiate analgesic 07/14/2018  . Pharmacologic therapy 07/14/2018  . Disorder of skeletal system 07/14/2018  . Problems influencing health status 07/14/2018  . HLD (hyperlipidemia) 07/10/2018  . DDD (degenerative disc disease), lumbar 07/10/2018  . Macular degeneration of both eyes 10/01/2017  . Class 1 obesity due to excess calories with serious comorbidity and body mass index (BMI) of 31.0 to 31.9 in adult 07/30/2016  . Mixed sensory-motor polyneuropathy 07/30/2016  . Vitamin B12 deficiency 07/30/2016  . Peripheral neuropathy due to chemotherapy ([place]) 12/23/2015  . Malignant neoplasm of right upper lobe of lung ([place]) 12/20/2015  . Bilateral lower extremity edema 12/09/2015  . COPD (chronic obstructive pulmonary disease) ([place])   . Primary cancer of bladder ([place])   . PAD (peripheral artery disease) ([place]) 03/01/2015  . Coronary artery disease involving native  coronary artery of native heart without angina pectoris 03/01/2015  . Health maintenance examination 01/05/2014  . Advanced care planning/counseling discussion 01/05/2014  . Prediabetes 12/30/2012  . Benign prostatic hyperplasia 12/30/2012  . Special screening for malignant neoplasms, colon 07/31/2011  . Medicare annual wellness visit, subsequent 07/31/2011  . Benign essential tremor   . Personal history of colonic polyps - adenomas   . HTN (hypertension)   . Ex-smoker   . Cardiac arrhythmia 03/11/2010    - Continue post-op CT for now.      All of the above findings and recommendations were discussed with the patient, patient's family, and the medical team, and all of patient'snd family's questions were answered to their expressed satisfaction.  -- Ronny Bacon, M.D., [facility] 04/29/2020

## 2020-04-29 NOTE — Progress Notes (Signed)
PROGRESS NOTE    Joseph Graham  JIR:[id number] DOB: 1940/09/09 DOA: 04/26/2020 PCP: [name], MD   Assessment & Plan:   Principal Problem:   Recurrent pneumothorax after pleural drain removed Active Problems:   HTN (hypertension)   Coronary artery disease involving native coronary artery of native heart without angina pectoris   COPD (chronic obstructive pulmonary disease) ([place])   Malignant neoplasm of right upper lobe of lung (HCC)   Chronic low back pain (Primary area of Pain) (Bilateral)  (L>R) w/o sciatica   Acute on chronic respiratory failure with hypoxia (HCC)   Bullous emphysema (HCC)   Pneumothorax   Recurrent pneumothorax: continue w/ chest tube as per general surg. General surg following and recs apprec. S/p talc pleurodesis 04/27/20 as per general surg  Chronic hypoxic respiratory failure: continue on supplemental oxygen and wean back to baseline as tolerated  Acute hypercarbic respiratory failure: improving. Able to be weaned off of BiPAP   COPD/bullous emphysema. W/o exacerbation. Continue on bronchodilators & encourage incentive spirometry   Lung cancer: undergoing radiation as an outpatient. Management per oncology outpatient   Macrocytic anemia: no need for a transfusion currently. Will continue to monitor   HTN: continue on home dose of metoprolol. Continue to hold home dose of lisinopril   CAD: stable. No chest pain. Continue on metoprolol. Continue to hold lisinopril  Chronic low back pain: norco prn    Benign essential tremor: continue on home dose of primidone   Peripheral neuropathy: likely secondary to chemo. Continue on gabapentin     DVT prophylaxis: SCDs Code Status: full  Family Communication: discussed pt's care w/ family at bedside and answered their questions  Disposition Plan: likely d/c back home vs SNF, depends on PT/OT recs  Status is: Inpatient  Remains inpatient appropriate because:Ongoing diagnostic testing needed  not appropriate for outpatient work up, Unsafe d/c plan, IV treatments appropriate due to intensity of illness or inability to take PO and Inpatient level of care appropriate due to severity of illness   Dispo: The patient is from: Home              Anticipated d/c is to: Home              Anticipated d/c date is: 3 days              Patient currently is not medically stable to d/c.     Consultants:   General surg   Procedures: pleurodesis    Antimicrobials:    Subjective: Pt c/o shortness of breath but improved from day prior.   Objective: Vitals:   04/28/20 2134 04/29/20 0454 04/29/20 0502 04/29/20 0734  BP: (!) 150/55  135/72 (!) 139/54  Pulse: 80  75 77  Resp: 20  17 20   Temp: 98.2 F (36.8 C)  99.1 F (37.3 C) 97.7 F (36.5 C)  TempSrc: Oral  Oral Oral  SpO2: 96%  94% 98%  Weight:  91.4 kg    Height:        Intake/Output Summary (Last 24 hours) at 04/29/2020 0826 Last data filed at 04/29/2020 0511 Gross per 24 hour  Intake 240 ml  Output 20 ml  Net 220 ml   Filed Weights   04/27/20 1547 04/28/20 0410 04/29/20 0454  Weight: 94.9 kg 88.3 kg 91.4 kg    Examination:  General exam: Appears uncomfortable. Appears frustrated  Respiratory system: diminished breath sounds b/l. Chest tube in place & draining serosanguinous fluid  Cardiovascular system: S1/S2+.  No rubs or gallops  Gastrointestinal system: Abdomen is nondistended, soft and nontender. Hypoactive bowel sounds  Central nervous system: Alert and awake. Moves all 4 extremities  Psychiatry: Judgement and insight appear normal. Appears frustrated & agitated     Data Reviewed: I have personally reviewed following labs and imaging studies  CBC: Recent Labs  Lab 04/25/20 2354 04/27/20 0524 04/27/20 1312 04/28/20 0543 04/29/20 0649  WBC 8.1 8.1 10.1 13.7* 8.6  NEUTROABS  --  5.8  --   --   --   HGB 12.1* 10.9* 12.4* 12.1* 10.5*  HCT 38.5* 35.7* 40.4 39.5 33.4*  MCV 100.5* 103.2* 103.3*  103.9* 102.5*  PLT 208 191 189 190 992   Basic Metabolic Panel: Recent Labs  Lab 04/25/20 2354 04/27/20 0524 04/27/20 1312 04/28/20 0543 04/29/20 0529  NA 141 141 141 141 141  K 4.3 4.2 4.3 4.3 4.7  CL 101 95* 98 95* 95*  CO2 30 37* 33* 36* 36*  GLUCOSE 145* 96 133* 148* 133*  BUN 25* 22 20 23 23   CREATININE 0.53* 0.62 0.62 0.75 0.72  CALCIUM 8.2* 8.5* 7.9* 8.1* 8.2*   GFR: Estimated Creatinine Clearance: 83.7 mL/min (by C-G formula based on SCr of 0.72 mg/dL). Liver Function Tests: No results for input(s): AST, ALT, ALKPHOS, BILITOT, PROT, ALBUMIN in the last 168 hours. No results for input(s): LIPASE, AMYLASE in the last 168 hours. No results for input(s): AMMONIA in the last 168 hours. Coagulation Profile: No results for input(s): INR, PROTIME in the last 168 hours. Cardiac Enzymes: No results for input(s): CKTOTAL, CKMB, CKMBINDEX, TROPONINI in the last 168 hours. BNP (last 3 results) No results for input(s): PROBNP in the last 8760 hours. HbA1C: No results for input(s): HGBA1C in the last 72 hours. CBG: No results for input(s): GLUCAP in the last 168 hours. Lipid Profile: No results for input(s): CHOL, HDL, LDLCALC, TRIG, CHOLHDL, LDLDIRECT in the last 72 hours. Thyroid Function Tests: No results for input(s): TSH, T4TOTAL, FREET4, T3FREE, THYROIDAB in the last 72 hours. Anemia Panel: No results for input(s): VITAMINB12, FOLATE, FERRITIN, TIBC, IRON, RETICCTPCT in the last 72 hours. Sepsis Labs: No results for input(s): PROCALCITON, LATICACIDVEN in the last 168 hours.  Recent Results (from the past 240 hour(s))  Respiratory Panel by RT PCR (Flu A&B, Covid) - Nasopharyngeal Swab     Status: None   Collection Time: 04/26/20 11:24 AM   Specimen: Nasopharyngeal Swab  Result Value Ref Range Status   SARS Coronavirus 2 by RT PCR NEGATIVE NEGATIVE Final    Comment: (NOTE) SARS-CoV-2 target nucleic acids are NOT DETECTED.  The SARS-CoV-2 RNA is generally detectable  in upper respiratoy specimens during the acute phase of infection. The lowest concentration of SARS-CoV-2 viral copies this assay can detect is 131 copies/mL. A negative result does not preclude SARS-Cov-2 infection and should not be used as the sole basis for treatment or other patient management decisions. A negative result may occur with  improper specimen collection/handling, submission of specimen other than nasopharyngeal swab, presence of viral mutation(s) within the areas targeted by this assay, and inadequate number of viral copies (<131 copies/mL). A negative result must be combined with clinical observations, patient history, and epidemiological information. The expected result is Negative.  Fact Sheet for Patients:  PinkCheek.be  Fact Sheet for Healthcare Providers:  GravelBags.it  This test is no t yet approved or cleared by the [place] FDA and  has been authorized for detection and/or diagnosis of SARS-CoV-2 by FDA  under an Emergency Use Authorization (EUA). This EUA will remain  in effect (meaning this test can be used) for the duration of the COVID-19 declaration under Section 564(b)(1) of the Act, 21 U.S.C. section 360bbb-3(b)(1), unless the authorization is terminated or revoked sooner.     Influenza A by PCR NEGATIVE NEGATIVE Final   Influenza B by PCR NEGATIVE NEGATIVE Final    Comment: (NOTE) The Xpert Xpress SARS-CoV-2/FLU/RSV assay is intended as an aid in  the diagnosis of influenza from Nasopharyngeal swab specimens and  should not be used as a sole basis for treatment. Nasal washings and  aspirates are unacceptable for Xpert Xpress SARS-CoV-2/FLU/RSV  testing.  Fact Sheet for Patients: PinkCheek.be  Fact Sheet for Healthcare Providers: GravelBags.it  This test is not yet approved or cleared by the [place] FDA and  has been  authorized for detection and/or diagnosis of SARS-CoV-2 by  FDA under an Emergency Use Authorization (EUA). This EUA will remain  in effect (meaning this test can be used) for the duration of the  Covid-19 declaration under Section 564(b)(1) of the Act, 21  U.S.C. section 360bbb-3(b)(1), unless the authorization is  terminated or revoked. Performed at [facility], [address] [place], [place] [address]          Radiology Studies: DG Chest [place] 1 View  Result Date: 04/28/2020 CLINICAL DATA:  Post VATS and pleurodesis with recurrence RIGHT sided pneumothorax EXAM: PORTABLE CHEST 1 VIEW COMPARISON:  04/27/2020 FINDINGS: RIGHT-sided chest tube remains in place. Unchanged with respect position. Cardiomediastinal contours are stable. The tracheal air column is unremarkable. Question small apical pneumothorax, approximately 2-3 mm. This may represent overlapping areas of lung disease. a there is also a question of deep sulcus on the RIGHT which may indicate a small basilar component as well appearing slightly different from the previous assessment. Diffuse interstitial and airspace opacities throughout the RIGHT and LEFT chest persist with similar appearance. On limited assessment no acute skeletal abnormality. IMPRESSION: 1. Question small 2-3 mm apical pneumothorax and possible small basilar component. Attention on follow-up. RIGHT chest tube remains in place. 2. Unchanged diffuse interstitial and airspace opacities throughout the RIGHT and LEFT chest. 3. No interval change in position of RIGHT chest tube. These results will be called to the ordering clinician or representative by the Radiologist Assistant, and communication documented in the PACS or [employer]. Electronically Signed   By: Zetta Bills M.D.   On: 04/28/2020 08:26   DG Chest [place] 1 View  Result Date: 04/27/2020 CLINICAL DATA:  Post op VATS.  Talc pleurodesis. EXAM: PORTABLE CHEST 1 VIEW COMPARISON:   Radiographs 04/26/2020 and 04/18/2020.  CT 04/16/2020. FINDINGS: 1232 hours. The heart size and mediastinal contours are stable. A right chest tube has been placed, projecting to the lower right paratracheal region. Surgical clips at the right lung apex and volume loss in the right hemithorax are unchanged. There is no residual pneumothorax. There are new diffuse left greater than right airspace opacities which most likely represent edema. No significant pleural effusion. IMPRESSION: 1. No evidence of residual pneumothorax following right chest tube placement. 2. New diffuse left greater than right airspace opacities most likely represent edema. Atypical infection less likely. Electronically Signed   By: Richardean Sale M.D.   On: 04/27/2020 14:54        Scheduled Meds: . gabapentin  300 mg Oral TID   And  . gabapentin  600 mg Oral TID  . metoprolol tartrate  12.5 mg  Oral BID  . pantoprazole  40 mg Oral Daily  . polyethylene glycol  17 g Oral Daily  . primidone  150 mg Oral BID  . rosuvastatin  10 mg Oral QHS  . traMADol  50-100 mg Oral Q6H   Continuous Infusions:   LOS: 3 days    Time spent: 31 mins    Wyvonnia Dusky, MD [facility] Pager [phone]  If 7PM-7AM, please contact night-coverage [email or web address] Password [facility] 04/29/2020, 8:26 AM

## 2020-04-30 ENCOUNTER — Inpatient Hospital Stay: Payer: Medicare Other

## 2020-04-30 DIAGNOSIS — I10 Essential (primary) hypertension: Secondary | ICD-10-CM | POA: Diagnosis not present

## 2020-04-30 DIAGNOSIS — J95811 Postprocedural pneumothorax: Secondary | ICD-10-CM | POA: Diagnosis not present

## 2020-04-30 DIAGNOSIS — C3411 Malignant neoplasm of upper lobe, right bronchus or lung: Secondary | ICD-10-CM | POA: Diagnosis not present

## 2020-04-30 LAB — CBC
HCT: 36.1 % — ABNORMAL LOW (ref 39.0–52.0)
Hemoglobin: 11.1 g/dL — ABNORMAL LOW (ref 13.0–17.0)
MCH: 31.7 pg (ref 26.0–34.0)
MCHC: 30.7 g/dL (ref 30.0–36.0)
MCV: 103.1 fL — ABNORMAL HIGH (ref 80.0–100.0)
Platelets: 197 10*3/uL (ref 150–400)
RBC: 3.5 MIL/uL — ABNORMAL LOW (ref 4.22–5.81)
RDW: 13.2 % (ref 11.5–15.5)
WBC: 9.2 10*3/uL (ref 4.0–10.5)
nRBC: 0 % (ref 0.0–0.2)

## 2020-04-30 LAB — BASIC METABOLIC PANEL
Anion gap: 11 (ref 5–15)
BUN: 23 mg/dL (ref 8–23)
CO2: 39 mmol/L — ABNORMAL HIGH (ref 22–32)
Calcium: 8.4 mg/dL — ABNORMAL LOW (ref 8.9–10.3)
Chloride: 92 mmol/L — ABNORMAL LOW (ref 98–111)
Creatinine, Ser: 0.53 mg/dL — ABNORMAL LOW (ref 0.61–1.24)
GFR, Estimated: >60 mL/min (ref >60)
Glucose, Bld: 135 mg/dL — ABNORMAL HIGH (ref 70–99)
Potassium: 4.2 mmol/L (ref 3.5–5.1)
Sodium: 142 mmol/L (ref 135–145)

## 2020-04-30 MED ORDER — LISINOPRIL 10 MG PO TABS
10.0000 mg | ORAL_TABLET | Freq: Every day | ORAL | Status: DC
  Administered 2020-04-30 – 2020-05-03 (×3): 10 mg via ORAL
  Filled 2020-04-30 (×4): qty 1

## 2020-04-30 NOTE — Progress Notes (Addendum)
[facility] Day(s): 4.   Post op day(s): 3 Days Post-Op.   Interval History: Patient seen and examined, no acute events or new complaints overnight.  Apparently has gone back on BiPAP assistance.   Vital signs in last 24 hours: [min-max] current  Temp:  [98.3 F (36.8 C)] 98.3 F (36.8 C) (11/20 0746) Pulse Rate:  [70-85] 85 (11/20 0844) Resp:  [16-20] 16 (11/20 0746) BP: (138-148)/(48-61) 146/56 (11/20 0944) SpO2:  [91 %-100 %] 100 % (11/20 0844) FiO2 (%):  [40 %] 40 % (11/19 2121) Weight:  [91.1 kg] 91.1 kg (11/20 0511)     Height: 5\' 9"  (175.3 cm) Weight: 91.1 kg BMI (Calculated): 29.64   Intake/Output last 2 shifts:  11/19 0701 - 11/20 0700 In: -  Out: 255 [Urine:250; Chest Tube:5]   Physical Exam:  Constitutional: resting...  Respiratory: breathing non-labored at present, CT w/o air leak, minimal output.  I removed the suction to the chest tube drainage collection system. Cardiovascular: regular rate and sinus rhythm  Gastrointestinal: soft, non-tender, and non-distended  Labs:  CBC Latest Ref Rng & Units 04/30/2020 04/29/2020 04/28/2020  WBC 4.0 - 10.5 K/uL 9.2 8.6 13.7(H)  Hemoglobin 13.0 - 17.0 g/dL 11.1(L) 10.5(L) 12.1(L)  Hematocrit 39 - 52 % 36.1(L) 33.4(L) 39.5  Platelets 150 - 400 K/uL 197 168 190   CMP Latest Ref Rng & Units 04/30/2020 04/29/2020 04/28/2020  Glucose 70 - 99 mg/dL 135(H) 133(H) 148(H)  BUN 8 - 23 mg/dL 23 23 23   Creatinine 0.61 - 1.24 mg/dL 0.53(L) 0.72 0.75  Sodium 135 - 145 mmol/L 142 141 141  Potassium 3.5 - 5.1 mmol/L 4.2 4.7 4.3  Chloride 98 - 111 mmol/L 92(L) 95(L) 95(L)  CO2 22 - 32 mmol/L 39(H) 36(H) 36(H)  Calcium 8.9 - 10.3 mg/dL 8.4(L) 8.2(L) 8.1(L)  Total Protein 6.5 - 8.1 g/dL - - -  Total Bilirubin 0.3 - 1.2 mg/dL - - -  Alkaline Phos 38 - 126 U/L - - -  AST 15 - 41 U/L - - -  ALT 0 - 44 U/L - - -    CLINICAL DATA:  Pneumothorax.  EXAM: PORTABLE CHEST 1  VIEW  COMPARISON:  04/28/2020  FINDINGS: Right-sided chest tube unchanged. Possible persistent tiny right apical pneumothorax. Slight worsening hazy opacification over the right base which may be due to effusion/atelectasis versus infection. Mild overall improvement of mixed interstitial airspace density bilaterally. Stable postsurgical change over the medial right apex. Cardiomediastinal silhouette and remainder of the exam is unchanged.  IMPRESSION: 1. Slight worsening hazy opacification right base which may be due to effusion/atelectasis versus infection. 2. Right-sided chest tube unchanged. Possible persistent tiny right apical pneumothorax. 3. Mild overall improvement in mixed interstitial airspace density bilaterally.   Electronically Signed   By: Marin Olp M.D.   On: 04/30/2020 09:17      Assessment/Plan: -79 y.o. male  3 Days Post-Op s/p right VATS, pleurodeisis for ptx, complicated by pertinent comorbidities including:  Patient Active Problem List   Diagnosis Date Noted  . Recurrent pneumothorax after pleural drain removed 04/26/2020  . Chronic respiratory failure with hypoxia ([place]) 04/26/2020  . Acute on chronic respiratory failure with hypoxia ([place]) 04/26/2020  . Bullous emphysema ([place]) 04/26/2020  . Pneumothorax 04/26/2020  . Pneumothorax on right 04/15/2020  . Grade 1 Retrolisthesis of L2/L3 11/25/2019  . Abnormal CT scan, lumbar spine (01/22/2019) 11/25/2019  . Osteoarthritis involving multiple joints 08/18/2019  . Rhinitis medicamentosa 07/14/2019  .  Right bundle branch block (RBBB) with left anterior hemiblock 01/21/2019  . Other specified spondylopathies, sacral and sacrococcygeal region ([place]) 01/15/2019  . Spondylosis without myelopathy or radiculopathy, lumbosacral region 08/28/2018  . Abnormal MRI, lumbar spine (06/12/2018) 08/20/2018  . DDD (degenerative disc disease), lumbosacral 08/07/2018  . Coccygodynia 08/07/2018  . Lumbar facet  hypertrophy (Multilevel) (Bilateral) 08/04/2018  . Osteoarthritis of facet joint of lumbar spine 08/04/2018  . Lumbar spondylosis 08/04/2018  . Lumbar facet syndrome (Bilateral) (L>R) 08/04/2018  . Lumbar lateral recess stenosis (L2-3, L3-4, L4-5) (Right) 08/04/2018  . Lumbar foraminal stenosis (Right: L2-3, L3-4) (Bilateral: L4-5) 08/04/2018  . Chronic low back pain (Primary area of Pain) (Bilateral)  (L>R) w/o sciatica 07/14/2018  . Chronic coccyx pain (Secondary Area of Pain) ([place]) 07/14/2018  . Chronic lower extremity pain [facility] Area of Pain) (Bilateral) (R>L) 07/14/2018  . Chronic pain syndrome 07/14/2018  . Long term current use of opiate analgesic 07/14/2018  . Pharmacologic therapy 07/14/2018  . Disorder of skeletal system 07/14/2018  . Problems influencing health status 07/14/2018  . HLD (hyperlipidemia) 07/10/2018  . DDD (degenerative disc disease), lumbar 07/10/2018  . Macular degeneration of both eyes 10/01/2017  . Class 1 obesity due to excess calories with serious comorbidity and body mass index (BMI) of 31.0 to 31.9 in adult 07/30/2016  . Mixed sensory-motor polyneuropathy 07/30/2016  . Vitamin B12 deficiency 07/30/2016  . Peripheral neuropathy due to chemotherapy ([place]) 12/23/2015  . Malignant neoplasm of right upper lobe of lung ([place]) 12/20/2015  . Bilateral lower extremity edema 12/09/2015  . COPD (chronic obstructive pulmonary disease) ([place])   . Primary cancer of bladder ([place])   . PAD (peripheral artery disease) ([place]) 03/01/2015  . Coronary artery disease involving native coronary artery of native heart without angina pectoris 03/01/2015  . Health maintenance examination 01/05/2014  . Advanced care planning/counseling discussion 01/05/2014  . Prediabetes 12/30/2012  . Benign prostatic hyperplasia 12/30/2012  . Special screening for malignant neoplasms, colon 07/31/2011  . Medicare annual wellness visit, subsequent 07/31/2011  . Benign essential tremor   .  Personal history of colonic polyps - adenomas   . HTN (hypertension)   . Ex-smoker   . Cardiac arrhythmia 03/11/2010    - Continue post-op CT but now on water seal  - mild setback from respiratory perspective, will f/u with CXR Tomorrow AM.      All of the above findings and recommendations were discussed with the patient, patient's family, and the medical team, and all of patient'snd family's questions were answered to their expressed satisfaction.  -- Ronny Bacon, M.D., [facility] 04/30/2020

## 2020-04-30 NOTE — Progress Notes (Signed)
PROGRESS NOTE    Joseph Graham  XVQ:[id number] DOB: [birth year] DOA: 04/26/2020 PCP: [name], MD   Assessment & Plan:   Principal Problem:   Recurrent pneumothorax after pleural drain removed Active Problems:   HTN (hypertension)   Coronary artery disease involving native coronary artery of native heart without angina pectoris   COPD (chronic obstructive pulmonary disease) ([place])   Malignant neoplasm of right upper lobe of lung (HCC)   Chronic low back pain (Primary area of Pain) (Bilateral)  (L>R) w/o sciatica   Acute on chronic respiratory failure with hypoxia (HCC)   Bullous emphysema (HCC)   Pneumothorax   Recurrent pneumothorax: continue w/ chest tube as per general surg. General surg following and recs apprec. S/p talc pleurodesis 04/27/20 as per general surg  Chronic hypoxic respiratory failure: continue on supplemental oxygen and wean back to baseline as tolerated  Acute hypercarbic respiratory failure: CO2 is labile. Stable on HFNC 7L currently. Weaned off of BiPAP  COPD/bullous emphysema. W/o exacerbation. Continue on bronchodilators & encourage incentive spirometry   Lung cancer: undergoing radiation as an outpatient. Management per oncology outpatient   Macrocytic anemia: H&H are trending up today.   HTN: continue on home dose of metoprolol. Continue to hold home dose of lisinopril   CAD: stable. No chest pain. Continue on metoprolol & restart lisinopril   Chronic low back pain: norco prn   Benign essential tremor: continue on home dose of primidone   Peripheral neuropathy: likely secondary to chemo. Continue on home dose of gabapentin     DVT prophylaxis: SCDs Code Status: full  Family Communication: discussed pt's care w/ family at bedside and answered their questions  Disposition Plan: d/c home vs SNF, depends on PT/OT recs  Status is: Inpatient  Remains inpatient appropriate because:Ongoing diagnostic testing needed not appropriate for  outpatient work up, Unsafe d/c plan, IV treatments appropriate due to intensity of illness or inability to take PO and Inpatient level of care appropriate due to severity of illness, still has chest tube in place    Dispo: The patient is from: Home              Anticipated d/c is to: Home              Anticipated d/c date is: 3 days              Patient currently is not medically stable to d/c.     Consultants:   General surg   Procedures: pleurodesis    Antimicrobials:    Subjective: Pt c/o incontinence   Objective: Vitals:   04/29/20 2121 04/30/20 0511 04/30/20 0531 04/30/20 0746  BP: (!) 147/53  138/61 (!) 148/56  Pulse: 85  79 70  Resp:   20 16  Temp: 98.3 F (36.8 C)  98.3 F (36.8 C) 98.3 F (36.8 C)  TempSrc: Oral  Oral Oral  SpO2: 91%  96% 97%  Weight:  91.1 kg    Height:        Intake/Output Summary (Last 24 hours) at 04/30/2020 0815 Last data filed at 04/30/2020 0932 Gross per 24 hour  Intake --  Output 255 ml  Net -255 ml   Filed Weights   04/28/20 0410 04/29/20 0454 04/30/20 0511  Weight: 88.3 kg 91.4 kg 91.1 kg    Examination:  General exam: Appears calm but uncomfortable  Respiratory system: decreased breath sounds b/l. Chest tube in place & draining serosanguinous fluid  Cardiovascular system: S1/S2+. No rubs or  gallops  Gastrointestinal system: Abdomen is nondistended, soft and nontender. Hypoactive bowel sounds  Central nervous system: Alert and awake. Moves all 4 extremities  Psychiatry: Judgement and insight appear normal. Appears frustrated & agitated     Data Reviewed: I have personally reviewed following labs and imaging studies  CBC: Recent Labs  Lab 04/27/20 0524 04/27/20 1312 04/28/20 0543 04/29/20 0649 04/30/20 0525  WBC 8.1 10.1 13.7* 8.6 9.2  NEUTROABS 5.8  --   --   --   --   HGB 10.9* 12.4* 12.1* 10.5* 11.1*  HCT 35.7* 40.4 39.5 33.4* 36.1*  MCV 103.2* 103.3* 103.9* 102.5* 103.1*  PLT 191 189 190 168 102    Basic Metabolic Panel: Recent Labs  Lab 04/27/20 0524 04/27/20 1312 04/28/20 0543 04/29/20 0529 04/30/20 0525  NA 141 141 141 141 142  K 4.2 4.3 4.3 4.7 4.2  CL 95* 98 95* 95* 92*  CO2 37* 33* 36* 36* 39*  GLUCOSE 96 133* 148* 133* 135*  BUN 22 20 23 23 23   CREATININE 0.62 0.62 0.75 0.72 0.53*  CALCIUM 8.5* 7.9* 8.1* 8.2* 8.4*   GFR: Estimated Creatinine Clearance: 83.6 mL/min (A) (by C-G formula based on SCr of 0.53 mg/dL (L)). Liver Function Tests: No results for input(s): AST, ALT, ALKPHOS, BILITOT, PROT, ALBUMIN in the last 168 hours. No results for input(s): LIPASE, AMYLASE in the last 168 hours. No results for input(s): AMMONIA in the last 168 hours. Coagulation Profile: No results for input(s): INR, PROTIME in the last 168 hours. Cardiac Enzymes: No results for input(s): CKTOTAL, CKMB, CKMBINDEX, TROPONINI in the last 168 hours. BNP (last 3 results) No results for input(s): PROBNP in the last 8760 hours. HbA1C: No results for input(s): HGBA1C in the last 72 hours. CBG: No results for input(s): GLUCAP in the last 168 hours. Lipid Profile: No results for input(s): CHOL, HDL, LDLCALC, TRIG, CHOLHDL, LDLDIRECT in the last 72 hours. Thyroid Function Tests: No results for input(s): TSH, T4TOTAL, FREET4, T3FREE, THYROIDAB in the last 72 hours. Anemia Panel: No results for input(s): VITAMINB12, FOLATE, FERRITIN, TIBC, IRON, RETICCTPCT in the last 72 hours. Sepsis Labs: No results for input(s): PROCALCITON, LATICACIDVEN in the last 168 hours.  Recent Results (from the past 240 hour(s))  Respiratory Panel by RT PCR (Flu A&B, Covid) - Nasopharyngeal Swab     Status: None   Collection Time: 04/26/20 11:24 AM   Specimen: Nasopharyngeal Swab  Result Value Ref Range Status   SARS Coronavirus 2 by RT PCR NEGATIVE NEGATIVE Final    Comment: (NOTE) SARS-CoV-2 target nucleic acids are NOT DETECTED.  The SARS-CoV-2 RNA is generally detectable in upper respiratoy specimens  during the acute phase of infection. The lowest concentration of SARS-CoV-2 viral copies this assay can detect is 131 copies/mL. A negative result does not preclude SARS-Cov-2 infection and should not be used as the sole basis for treatment or other patient management decisions. A negative result may occur with  improper specimen collection/handling, submission of specimen other than nasopharyngeal swab, presence of viral mutation(s) within the areas targeted by this assay, and inadequate number of viral copies (<131 copies/mL). A negative result must be combined with clinical observations, patient history, and epidemiological information. The expected result is Negative.  Fact Sheet for Patients:  PinkCheek.be  Fact Sheet for Healthcare Providers:  GravelBags.it  This test is no t yet approved or cleared by the [place] FDA and  has been authorized for detection and/or diagnosis of SARS-CoV-2 by FDA under  an Emergency Use Authorization (EUA). This EUA will remain  in effect (meaning this test can be used) for the duration of the COVID-19 declaration under Section 564(b)(1) of the Act, 21 U.S.C. section 360bbb-3(b)(1), unless the authorization is terminated or revoked sooner.     Influenza A by PCR NEGATIVE NEGATIVE Final   Influenza B by PCR NEGATIVE NEGATIVE Final    Comment: (NOTE) The Xpert Xpress SARS-CoV-2/FLU/RSV assay is intended as an aid in  the diagnosis of influenza from Nasopharyngeal swab specimens and  should not be used as a sole basis for treatment. Nasal washings and  aspirates are unacceptable for Xpert Xpress SARS-CoV-2/FLU/RSV  testing.  Fact Sheet for Patients: PinkCheek.be  Fact Sheet for Healthcare Providers: GravelBags.it  This test is not yet approved or cleared by the [place] FDA and  has been authorized for detection and/or  diagnosis of SARS-CoV-2 by  FDA under an Emergency Use Authorization (EUA). This EUA will remain  in effect (meaning this test can be used) for the duration of the  Covid-19 declaration under Section 564(b)(1) of the Act, 21  U.S.C. section 360bbb-3(b)(1), unless the authorization is  terminated or revoked. Performed at [facility], [address], [place], [place] [address]          Radiology Studies: DG Chest [place] 1 View  Result Date: 04/28/2020 CLINICAL DATA:  Post VATS and pleurodesis with recurrence RIGHT sided pneumothorax EXAM: PORTABLE CHEST 1 VIEW COMPARISON:  04/27/2020 FINDINGS: RIGHT-sided chest tube remains in place. Unchanged with respect position. Cardiomediastinal contours are stable. The tracheal air column is unremarkable. Question small apical pneumothorax, approximately 2-3 mm. This may represent overlapping areas of lung disease. a there is also a question of deep sulcus on the RIGHT which may indicate a small basilar component as well appearing slightly different from the previous assessment. Diffuse interstitial and airspace opacities throughout the RIGHT and LEFT chest persist with similar appearance. On limited assessment no acute skeletal abnormality. IMPRESSION: 1. Question small 2-3 mm apical pneumothorax and possible small basilar component. Attention on follow-up. RIGHT chest tube remains in place. 2. Unchanged diffuse interstitial and airspace opacities throughout the RIGHT and LEFT chest. 3. No interval change in position of RIGHT chest tube. These results will be called to the ordering clinician or representative by the Radiologist Assistant, and communication documented in the PACS or [employer]. Electronically Signed   By: Zetta Bills M.D.   On: 04/28/2020 08:26        Scheduled Meds: . gabapentin  300 mg Oral TID   And  . gabapentin  600 mg Oral TID  . metoprolol tartrate  12.5 mg Oral BID  . pantoprazole  40 mg Oral Daily  .  polyethylene glycol  17 g Oral Daily  . primidone  150 mg Oral BID  . rosuvastatin  10 mg Oral QHS  . traMADol  50-100 mg Oral Q6H   Continuous Infusions:   LOS: 4 days    Time spent: 32 mins    Wyvonnia Dusky, MD [facility] Pager [phone]  If 7PM-7AM, please contact night-coverage [email or web address] Password [facility] 04/30/2020, 8:15 AM

## 2020-04-30 NOTE — Progress Notes (Signed)
PT Cancellation Note  Patient Details Name: Joseph Graham MRN: [id number] DOB: 12/15/1940   Cancelled Treatment:    Reason Eval/Treat Not Completed: Patient not medically ready.  Pt has returned to bipap and is very weak and tired.  Asks PT to come back another time.  Will retry tomorrow.   Ramond Dial 04/30/2020, 3:13 PM   Mee Hives, PT MS Acute Rehab Dept. Number: [place]

## 2020-05-01 DIAGNOSIS — G8929 Other chronic pain: Secondary | ICD-10-CM

## 2020-05-01 DIAGNOSIS — J9621 Acute and chronic respiratory failure with hypoxia: Secondary | ICD-10-CM

## 2020-05-01 DIAGNOSIS — J439 Emphysema, unspecified: Secondary | ICD-10-CM

## 2020-05-01 DIAGNOSIS — M545 Low back pain, unspecified: Secondary | ICD-10-CM | POA: Diagnosis not present

## 2020-05-01 DIAGNOSIS — J95811 Postprocedural pneumothorax: Secondary | ICD-10-CM | POA: Diagnosis not present

## 2020-05-01 LAB — BASIC METABOLIC PANEL
Anion gap: 12 (ref 5–15)
BUN: 22 mg/dL (ref 8–23)
CO2: 39 mmol/L — ABNORMAL HIGH (ref 22–32)
Calcium: 8.6 mg/dL — ABNORMAL LOW (ref 8.9–10.3)
Chloride: 91 mmol/L — ABNORMAL LOW (ref 98–111)
Creatinine, Ser: 0.54 mg/dL — ABNORMAL LOW (ref 0.61–1.24)
GFR, Estimated: >60 mL/min (ref >60)
Glucose, Bld: 116 mg/dL — ABNORMAL HIGH (ref 70–99)
Potassium: 4.4 mmol/L (ref 3.5–5.1)
Sodium: 142 mmol/L (ref 135–145)

## 2020-05-01 LAB — CBC
HCT: 34.9 % — ABNORMAL LOW (ref 39.0–52.0)
Hemoglobin: 11 g/dL — ABNORMAL LOW (ref 13.0–17.0)
MCH: 31.8 pg (ref 26.0–34.0)
MCHC: 31.5 g/dL (ref 30.0–36.0)
MCV: 100.9 fL — ABNORMAL HIGH (ref 80.0–100.0)
Platelets: 201 10*3/uL (ref 150–400)
RBC: 3.46 MIL/uL — ABNORMAL LOW (ref 4.22–5.81)
RDW: 13.2 % (ref 11.5–15.5)
WBC: 7.7 10*3/uL (ref 4.0–10.5)
nRBC: 0 % (ref 0.0–0.2)

## 2020-05-01 MED ORDER — FUROSEMIDE 10 MG/ML IJ SOLN
40.0000 mg | Freq: Once | INTRAMUSCULAR | Status: AC
  Administered 2020-05-01: 40 mg via INTRAVENOUS
  Filled 2020-05-01: qty 4

## 2020-05-01 NOTE — Progress Notes (Signed)
[facility] Day(s): 5.   Post op day(s): 4 Days Post-Op.   Interval History: Patient seen and examined, no acute events or new complaints overnight.  Continuing on BiPAP assistance.     Vital signs in last 24 hours: [min-max] current  Temp:  [98 F (36.7 C)-99.3 F (37.4 C)] 98.2 F (36.8 C) (11/21 0800) Pulse Rate:  [67-82] 77 (11/21 0800) Resp:  [16-20] 18 (11/21 0800) BP: (125-148)/(50-61) 147/54 (11/21 0800) SpO2:  [91 %-99 %] 97 % (11/21 0800) Weight:  [86.9 kg] 86.9 kg (11/21 0350)     Height: 5\' 9"  (175.3 cm) Weight: 86.9 kg BMI (Calculated): 28.28   Intake/Output last 2 shifts:  11/20 0701 - 11/21 0700 In: 240 [P.O.:240] Out: 110 [Urine:100; Chest Tube:10]   Physical Exam:  Constitutional: resting...  Respiratory: breathing non-labored at present, CT w/o air leak, minimal output.  I removed the right chest tube with its drainage collection system.  This was well-tolerated. Cardiovascular: regular rate and sinus rhythm  Gastrointestinal: soft, non-tender, and non-distended  Labs:  CBC Latest Ref Rng & Units 05/01/2020 04/30/2020 04/29/2020  WBC 4.0 - 10.5 K/uL 7.7 9.2 8.6  Hemoglobin 13.0 - 17.0 g/dL 11.0(L) 11.1(L) 10.5(L)  Hematocrit 39 - 52 % 34.9(L) 36.1(L) 33.4(L)  Platelets 150 - 400 K/uL 201 197 168   CMP Latest Ref Rng & Units 05/01/2020 04/30/2020 04/29/2020  Glucose 70 - 99 mg/dL 116(H) 135(H) 133(H)  BUN 8 - 23 mg/dL 22 23 23   Creatinine 0.61 - 1.24 mg/dL 0.54(L) 0.53(L) 0.72  Sodium 135 - 145 mmol/L 142 142 141  Potassium 3.5 - 5.1 mmol/L 4.4 4.2 4.7  Chloride 98 - 111 mmol/L 91(L) 92(L) 95(L)  CO2 22 - 32 mmol/L 39(H) 39(H) 36(H)  Calcium 8.9 - 10.3 mg/dL 8.6(L) 8.4(L) 8.2(L)  Total Protein 6.5 - 8.1 g/dL - - -  Total Bilirubin 0.3 - 1.2 mg/dL - - -  Alkaline Phos 38 - 126 U/L - - -  AST 15 - 41 U/L - - -  ALT 0 - 44 U/L - - -    CLINICAL DATA:  Pneumothorax.  EXAM: PORTABLE CHEST 1  VIEW  COMPARISON:  04/28/2020  FINDINGS: Right-sided chest tube unchanged. Possible persistent tiny right apical pneumothorax. Slight worsening hazy opacification over the right base which may be due to effusion/atelectasis versus infection. Mild overall improvement of mixed interstitial airspace density bilaterally. Stable postsurgical change over the medial right apex. Cardiomediastinal silhouette and remainder of the exam is unchanged.  IMPRESSION: 1. Slight worsening hazy opacification right base which may be due to effusion/atelectasis versus infection. 2. Right-sided chest tube unchanged. Possible persistent tiny right apical pneumothorax. 3. Mild overall improvement in mixed interstitial airspace density bilaterally.   Electronically Signed   By: Marin Olp M.D.   On: 04/30/2020 09:17      Assessment/Plan: -79 y.o. male  4 Days Post-Op s/p right VATS, pleurodeisis for ptx, complicated by pertinent comorbidities including:  Patient Active Problem List   Diagnosis Date Noted  . Recurrent pneumothorax after pleural drain removed 04/26/2020  . Chronic respiratory failure with hypoxia ([place]) 04/26/2020  . Acute on chronic respiratory failure with hypoxia ([place]) 04/26/2020  . Bullous emphysema ([place]) 04/26/2020  . Pneumothorax 04/26/2020  . Pneumothorax on right 04/15/2020  . Grade 1 Retrolisthesis of L2/L3 11/25/2019  . Abnormal CT scan, lumbar spine (01/22/2019) 11/25/2019  . Osteoarthritis involving multiple joints 08/18/2019  . Rhinitis medicamentosa 07/14/2019  . Right bundle  branch block (RBBB) with left anterior hemiblock 01/21/2019  . Other specified spondylopathies, sacral and sacrococcygeal region ([place]) 01/15/2019  . Spondylosis without myelopathy or radiculopathy, lumbosacral region 08/28/2018  . Abnormal MRI, lumbar spine (06/12/2018) 08/20/2018  . DDD (degenerative disc disease), lumbosacral 08/07/2018  . Coccygodynia 08/07/2018  . Lumbar facet  hypertrophy (Multilevel) (Bilateral) 08/04/2018  . Osteoarthritis of facet joint of lumbar spine 08/04/2018  . Lumbar spondylosis 08/04/2018  . Lumbar facet syndrome (Bilateral) (L>R) 08/04/2018  . Lumbar lateral recess stenosis (L2-3, L3-4, L4-5) (Right) 08/04/2018  . Lumbar foraminal stenosis (Right: L2-3, L3-4) (Bilateral: L4-5) 08/04/2018  . Chronic low back pain (Primary area of Pain) (Bilateral)  (L>R) w/o sciatica 07/14/2018  . Chronic coccyx pain (Secondary Area of Pain) ([place]) 07/14/2018  . Chronic lower extremity pain [facility] Area of Pain) (Bilateral) (R>L) 07/14/2018  . Chronic pain syndrome 07/14/2018  . Long term current use of opiate analgesic 07/14/2018  . Pharmacologic therapy 07/14/2018  . Disorder of skeletal system 07/14/2018  . Problems influencing health status 07/14/2018  . HLD (hyperlipidemia) 07/10/2018  . DDD (degenerative disc disease), lumbar 07/10/2018  . Macular degeneration of both eyes 10/01/2017  . Class 1 obesity due to excess calories with serious comorbidity and body mass index (BMI) of 31.0 to 31.9 in adult 07/30/2016  . Mixed sensory-motor polyneuropathy 07/30/2016  . Vitamin B12 deficiency 07/30/2016  . Peripheral neuropathy due to chemotherapy ([place]) 12/23/2015  . Malignant neoplasm of right upper lobe of lung ([place]) 12/20/2015  . Bilateral lower extremity edema 12/09/2015  . COPD (chronic obstructive pulmonary disease) ([place])   . Primary cancer of bladder ([place])   . PAD (peripheral artery disease) ([place]) 03/01/2015  . Coronary artery disease involving native coronary artery of native heart without angina pectoris 03/01/2015  . Health maintenance examination 01/05/2014  . Advanced care planning/counseling discussion 01/05/2014  . Prediabetes 12/30/2012  . Benign prostatic hyperplasia 12/30/2012  . Special screening for malignant neoplasms, colon 07/31/2011  . Medicare annual wellness visit, subsequent 07/31/2011  . Benign essential tremor   .  Personal history of colonic polyps - adenomas   . HTN (hypertension)   . Ex-smoker   . Cardiac arrhythmia 03/11/2010    - mild setback from respiratory perspective, will f/u with CXR Tomorrow AM.      All of the above findings and recommendations were discussed with the patient, patient's family, and the medical team, and all of patient'snd family's questions were answered to their expressed satisfaction.  -- Ronny Bacon, M.D., [facility] 05/01/2020

## 2020-05-01 NOTE — Evaluation (Signed)
Physical Therapy Evaluation Patient Details Name: Joseph Graham MRN: [id number] DOB: 1940-09-09 Today's Date: 05/01/2020   History of Present Illness  Joseph Graham is a 79 y.o. 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 Chest x-ray showed recurrent right pneumothorax  Clinical Impression  Pt was seen for mobility on bed where initially he could not move due to pain, then could tolerate rolling and ROM to legs.  He is hoping to get up to chair tomorrow, and will work with him to manage timing of pain meds for this increased mobility.  Follow up acutely for progression to gait and transfers to chair as he can agree.  Pt is aware a rehab placement is recommended and is in agreement.  Follow acutely for goals of PT.    Follow Up Recommendations SNF    Equipment Recommendations  None recommended by PT    Recommendations for Other Services       Precautions / Restrictions Precautions Precautions: Fall Precaution Comments: chest tube Restrictions Weight Bearing Restrictions: No      Mobility  Bed Mobility Overal bed mobility: Needs Assistance Bed Mobility: Rolling (scooting) Rolling: Mod assist         General bed mobility comments: 2 max to scoot up the bed    Transfers                 General transfer comment: declined to attempt  Ambulation/Gait                Stairs            Wheelchair Mobility    Modified Rankin (Stroke Patients Only)       Balance Overall balance assessment: Needs assistance     Sitting balance -  Comments: unable to try with pt                                     Pertinent Vitals/Pain Pain Assessment: Faces Faces Pain Scale: Hurts even more Pain Location: chest, low back Pain Descriptors / Indicators: Grimacing;Discomfort Pain Intervention(s): Limited activity within patient's tolerance;Monitored during session;Premedicated before session;Repositioned    Home Living Family/patient expects to be discharged to:: Private residence Living Arrangements: Spouse/significant other Available Help at Discharge: Family;Available 24 hours/day Type of Home: House Home Access: Stairs to enter Entrance Stairs-Rails: None Entrance Stairs-Number of Steps: 1 Home Layout: Two level Home Equipment: Cane - single point;Walker - 4 wheels;Shower seat;Grab bars - tub/shower Additional Comments: sleeping upright in chair at home    Prior Function Level of Independence: Independent with assistive device(s)         Comments: Pt reports using SPC in the home and rollator for community ambulation; pt requires use of BUE to stand. Independen I/ADLs including cooking     Hand Dominance   Dominant Hand: Left    Extremity/Trunk Assessment   Upper Extremity Assessment Upper Extremity Assessment: Defer to OT evaluation    Lower Extremity Assessment Lower Extremity Assessment: Overall WFL for tasks assessed RLE: Unable to fully assess due to pain LLE: Unable to fully assess due to pain  Cervical / Trunk Assessment Cervical / Trunk Assessment: Lordotic  Communication   Communication: No difficulties  Cognition Arousal/Alertness: Awake/alert Behavior During Therapy: WFL for tasks assessed/performed Overall Cognitive Status: Within Functional Limits for tasks assessed                                 General Comments: Pt A&O x 4.      General Comments General comments (skin integrity, edema, etc.): O2 sats were sporadically checked    Exercises General  Exercises - Upper Extremity Shoulder Flexion: AROM;Strengthening;Both;5 reps;Supine Shoulder Extension: AROM;Strengthening;Both;5 reps;Supine Shoulder Horizontal ABduction: AROM;Strengthening;Both;5 reps;Supine Shoulder Horizontal ADduction: AROM;Strengthening;Both;5 reps;Supine Elbow Flexion: AROM;Strengthening;Both;5 reps;Supine Elbow Extension: AROM;Strengthening;Both;5 reps;Supine General Exercises - Lower Extremity Ankle Circles/Pumps: AROM;AAROM Heel Slides: AAROM Hip ABduction/ADduction: AAROM Straight Leg Raises: AAROM;5 reps Other Exercises Other Exercises: Pt and wife are in agreement to go to rehab stay before home Other Exercises: Self-drinking, LBD   Assessment/Plan    PT Assessment Patient needs continued PT services  PT Problem List Decreased strength;Decreased balance;Decreased mobility;Cardiopulmonary status limiting activity       PT Treatment Interventions DME instruction;Gait training;Stair training;Functional mobility training;Therapeutic activities;Therapeutic exercise;Balance training;Patient/family education    PT Goals (Current goals can be found in the Care Plan section)  Acute Rehab PT Goals Patient Stated Goal: to get stronger to go home PT Goal Formulation: With patient Time For Goal Achievement: 05/15/20 Potential to Achieve Goals: Good    Frequency Min 2X/week   Barriers to discharge Inaccessible home environment wife cannot lift him    Co-evaluation               AM-PAC PT "6 Clicks" Mobility  Outcome Measure Help needed turning from your back to your side while in a flat bed without using bedrails?: A Little Help needed moving from lying on your back to sitting on the side of a flat bed without using bedrails?: A Lot Help needed moving to and from a bed to a chair (including a wheelchair)?: A Lot Help needed standing up from a chair using your arms (e.g., wheelchair or bedside chair)?: A Lot Help needed to walk in hospital room?: A  Lot Help needed climbing 3-5 steps with a railing? : A Lot 6 Click Score: 13    End of Session Equipment Utilized During Treatment: Oxygen Activity Tolerance: Patient tolerated treatment well Patient left: with call bell/phone within reach;with family/visitor present;in bed Nurse Communication: Mobility status PT Visit Diagnosis: Unsteadiness on feet (R26.81);Other abnormalities of gait and mobility (R26.89);Muscle weakness (generalized) (M62.81);History of falling (Z91.81);Pain Pain - Right/Left:  (both) Pain - part of body: Hip;Knee;Shoulder (back)    Time: 9030-0923 (1414 to 1434) PT Time Calculation (min) (ACUTE ONLY): 21 min   Charges:   PT Evaluation $PT Eval Moderate Complexity: 1 Mod PT Treatments $Therapeutic Exercise: 8-22 mins       Ramond Dial 05/01/2020, 5:03 PM  Mee Hives, PT MS Acute Rehab Dept. Number: [place]

## 2020-05-01 NOTE — Evaluation (Signed)
Occupational Therapy Evaluation Patient Details Name: Joseph Graham MRN: [id number] DOB: 1941/03/31 Today's Date: 05/01/2020    History of Present Illness Joseph Graham is a 79 y.o. 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 Chest x-ray showed recurrent right pneumothorax   Clinical Impression   Joseph Graham was seen for OT evaluation this date. Prior to hospital admission, pt was MOD I using SPC for mobility and I/ADLs including cooking. Pt lives c wife in 2 level home c 1 STE. Pt presents to acute OT demonstrating impaired ADL performance and functional mobility 2/2 pain, functional strength/balance deficits, decreased LB access, and decreased activity tolerance. Pt currently requires MIN A self-feeding at bed level - pt unable to tolerate HoB >45* 2/2 chronic coccyx pain. MAX A for LBD at bed level. Anticipate MAX x2 for toileting at bed level. Pt defers mobility this date citing pain and fatigue; agreeable to attempt next date and to bed level exercises. SpO2 desat 88% on 7L HFNC c bed level BUE exercises - resolved to 92% c rest. Pt would benefit from skilled OT to address noted impairments and functional limitations (see below for any additional details) in order to maximize safety and independence while minimizing falls risk and caregiver burden. Upon hospital discharge, recommend STR to maximize pt safety and return to PLOF.     Follow Up Recommendations  SNF    Equipment Recommendations  Other (comment) (TBD)    Recommendations for Other Services       Precautions /  Restrictions Precautions Precautions: Fall Precaution Comments: chest tube Restrictions Weight Bearing Restrictions: No      Mobility Bed Mobility Overal bed mobility: Needs Assistance      General bed mobility comments: Pt refuses citing pain and fatigue; agreeable to attempt next date    Transfers       General transfer comment: not tested        ADL either performed or assessed with clinical judgement   ADL Overall ADL's : Needs assistance/impaired    General ADL Comments: MIN A self-feeding at bed level - pt unable to tolerate HoB >45* 2/2 chronic coccyx pain. MAX A for LBD at bed level. Anticipate MAX x2 for toileting at bed level                   Pertinent Vitals/Pain Pain Assessment: Faces Faces Pain Scale: Hurts even more Pain Location: chest, low back Pain Descriptors / Indicators: Grimacing;Discomfort Pain Intervention(s): Limited activity within patient's tolerance;Repositioned;Patient requesting pain meds-RN notified     Hand Dominance Left   Extremity/Trunk Assessment Upper Extremity Assessment Upper Extremity Assessment: Overall WFL for tasks assessed   Lower Extremity Assessment Lower Extremity Assessment: RLE deficits/detail;LLE deficits/detail RLE: Unable to fully assess due to pain LLE: Unable to fully assess due to pain       Communication Communication Communication: No difficulties   Cognition Arousal/Alertness: Awake/alert Behavior During Therapy: WFL for tasks assessed/performed Overall Cognitive Status: Within Functional Limits for tasks assessed        General Comments: Pt A&O x 4.   General Comments  SpO2 desat 88% on 7L HFNC c bed level BUE exercises - resolved  to 92% c rest    Exercises Exercises: Other exercises;General Upper Extremity General Exercises - Upper Extremity Shoulder Flexion: AROM;Strengthening;Both;5 reps;Supine Shoulder Extension: AROM;Strengthening;Both;5 reps;Supine Shoulder Horizontal ABduction:  AROM;Strengthening;Both;5 reps;Supine Shoulder Horizontal ADduction: AROM;Strengthening;Both;5 reps;Supine Elbow Flexion: AROM;Strengthening;Both;5 reps;Supine Elbow Extension: AROM;Strengthening;Both;5 reps;Supine Other Exercises Other Exercises: Pt and wife educated re: OT role, DME recs, d/c recs, falls prevention, ECS, BUE theraband HEP (provided) Other Exercises: Self-drinking, LBD   Shoulder Instructions      Home Living Family/patient expects to be discharged to:: Private residence Living Arrangements: Spouse/significant other Available Help at Discharge: Family;Available 24 hours/day Type of Home: House Home Access: Stairs to enter [employer] of Steps: 1 Entrance Stairs-Rails: None Home Layout: Two level     Bathroom Shower/Tub: Occupational psychologist: Handicapped height     Home Equipment: Cane - single point;Walker - 4 wheels;Shower seat;Grab bars - tub/shower   Additional Comments: Pt states that he sleeps in a recliner chair and is on 2L O2 at baseline at home.      Prior Functioning/Environment Level of Independence: Independent with assistive device(s)        Comments: Pt reports using SPC in the home and rollator for community ambulation; pt requires use of BUE to stand. Independen I/ADLs including cooking        OT Problem List: Decreased range of motion;Decreased strength;Decreased activity tolerance;Impaired balance (sitting and/or standing);Pain      OT Treatment/Interventions: Self-care/ADL training;Therapeutic exercise;Energy conservation;DME and/or AE instruction;Therapeutic activities;Balance training;Patient/family education    OT Goals(Current goals can be found in the care plan section) Acute Rehab OT Goals Patient Stated Goal: To return to PLOF OT Goal Formulation: With patient/family Time For Goal Achievement: 05/15/20 Potential to Achieve Goals: Good ADL Goals Pt Will Perform Grooming: with modified  independence;sitting Pt Will Perform Lower Body Dressing: with mod assist;sitting/lateral leans;with caregiver independent in assisting (c AE PRN) Pt Will Transfer to Toilet: with mod assist;stand pivot transfer;bedside commode (c LRAD PRN)  OT Frequency: Min 1X/week    AM-PAC OT "6 Clicks" Daily Activity     Outcome Measure Help from another person eating meals?: A Little Help from another person taking care of personal grooming?: A Little Help from another person toileting, which includes using toliet, bedpan, or urinal?: A Lot Help from another person bathing (including washing, rinsing, drying)?: A Lot Help from another person to put on and taking off regular upper body clothing?: A Lot Help from another person to put on and taking off regular lower body clothing?: A Lot 6 Click Score: 14   End of Session Equipment Utilized During Treatment: Oxygen (7L HFNC) Nurse Communication: Patient requests pain meds  Activity Tolerance: Patient limited by pain Patient left: in bed;with call bell/phone within reach;with bed alarm set;with nursing/sitter in room;with family/visitor present  OT Visit Diagnosis: Other abnormalities of gait and mobility (R26.89);Muscle weakness (generalized) (M62.81)                Time: 9629-5284 OT Time Calculation (min): 28 min Charges:  OT General Charges $OT Visit: 1 Visit OT Evaluation $OT Eval Moderate Complexity: 1 Mod OT Treatments $Self Care/Home Management : 8-22 mins $Therapeutic Exercise: 8-22 mins  Dessie Coma, M.S. OTR/L  05/01/20, 2:42 PM  ascom [phone]

## 2020-05-01 NOTE — Progress Notes (Signed)
PROGRESS NOTE    Joseph Graham  JQB:[id number] DOB: [birth year] DOA: 04/26/2020 PCP: [name], MD   Assessment & Plan:   Principal Problem:   Recurrent pneumothorax after pleural drain removed Active Problems:   HTN (hypertension)   Coronary artery disease involving native coronary artery of native heart without angina pectoris   COPD (chronic obstructive pulmonary disease) ([facility])   Malignant neoplasm of right upper lobe of lung (HCC)   Chronic low back pain (Primary area of Pain) (Bilateral)  (L>R) w/o sciatica   Acute on chronic respiratory failure with hypoxia (HCC)   Bullous emphysema (HCC)   Pneumothorax  Recurrent pneumothorax: continue w/ chest tube as per general surg. General surg following and recs apprec. S/p talc pleurodesis 04/27/20 as per general surg. Surgery likely planning to get chest tube out today  Chronic hypoxic respiratory failure: continue on supplemental oxygen and wean back to baseline as tolerated. Uses 2 liters chronically  Acute hypercarbic respiratory failure: CO2 is labile. Weaned off to HFNC 5L currently. Will give 40 mg IV lasix and wean as able  COPD/bullous emphysema. W/o exacerbation. Continue on bronchodilators & encourage incentive spirometry   Lung cancer: undergoing radiation as an outpatient. Management per oncology outpatient   Macrocytic anemia: H&H are trending up today.   HTN: continue on home dose of metoprolol. Continue to hold home dose of lisinopril   CAD: stable. No chest pain. Continue on metoprolol & restart lisinopril   Chronic low back pain: norco prn   Benign essential tremor: continue on home dose of primidone   Peripheral neuropathy: likely secondary to chemo. Continue on home dose of gabapentin     DVT prophylaxis: SCDs Code Status: full  Family Communication: discussed pt's care w/ family at bedside and answered their questions  Disposition Plan: Home per PT/OT recs  Status is: Inpatient  Remains  inpatient appropriate because:Ongoing diagnostic testing needed not appropriate for outpatient work up, Unsafe d/c plan, IV treatments appropriate due to intensity of illness or inability to take PO and Inpatient level of care appropriate due to severity of illness, still has chest tube in place    Dispo: The patient is from: Home              Anticipated d/c is to: Home              Anticipated d/c date is: 2 days              Patient currently is not medically stable to d/c.   Consultants:   General surg   Procedures: pleurodesis    Antimicrobials:    Subjective: No new complaints, feels some better  Objective: Vitals:   05/01/20 0350 05/01/20 0800 05/01/20 1200 05/01/20 1453  BP: (!) 131/51 (!) 147/54 (!) 153/48   Pulse: 71 77 79   Resp: 16 18 19    Temp: 98.4 F (36.9 C) 98.2 F (36.8 C) 98.4 F (36.9 C)   TempSrc: Axillary Oral Oral   SpO2: 99% 97% (!) 88% 92%  Weight: 86.9 kg     Height:        Intake/Output Summary (Last 24 hours) at 05/01/2020 1555 Last data filed at 05/01/2020 1412 Gross per 24 hour  Intake 240 ml  Output 200 ml  Net 40 ml   Filed Weights   04/29/20 0454 04/30/20 0511 05/01/20 0350  Weight: 91.4 kg 91.1 kg 86.9 kg    Examination:  General exam: Appears calm but uncomfortable  Respiratory system: decreased  breath sounds b/l. Chest tube in place & draining serosanguinous fluid  Cardiovascular system: S1/S2+. No rubs or gallops  Gastrointestinal system: Abdomen is nondistended, soft and nontender. Hypoactive bowel sounds  Central nervous system: Alert and awake. Moves all 4 extremities  Psychiatry: Judgement and insight appear normal. Appears frustrated & agitated     Data Reviewed: I have personally reviewed following labs and imaging studies  CBC: Recent Labs  Lab 04/27/20 0524 04/27/20 0524 04/27/20 1312 04/28/20 0543 04/29/20 0649 04/30/20 0525 05/01/20 0538  WBC 8.1   < > 10.1 13.7* 8.6 9.2 7.7  NEUTROABS 5.8  --    --   --   --   --   --   HGB 10.9*   < > 12.4* 12.1* 10.5* 11.1* 11.0*  HCT 35.7*   < > 40.4 39.5 33.4* 36.1* 34.9*  MCV 103.2*   < > 103.3* 103.9* 102.5* 103.1* 100.9*  PLT 191   < > 189 190 168 197 201   < > = values in this interval not displayed.   Basic Metabolic Panel: Recent Labs  Lab 04/27/20 1312 04/28/20 0543 04/29/20 0529 04/30/20 0525 05/01/20 0538  NA 141 141 141 142 142  K 4.3 4.3 4.7 4.2 4.4  CL 98 95* 95* 92* 91*  CO2 33* 36* 36* 39* 39*  GLUCOSE 133* 148* 133* 135* 116*  BUN 20 23 23 23 22   CREATININE 0.62 0.75 0.72 0.53* 0.54*  CALCIUM 7.9* 8.1* 8.2* 8.4* 8.6*   GFR: Estimated Creatinine Clearance: 81.8 mL/min (A) (by C-G formula based on SCr of 0.54 mg/dL (L)). Liver Function Tests: No results for input(s): AST, ALT, ALKPHOS, BILITOT, PROT, ALBUMIN in the last 168 hours. No results for input(s): LIPASE, AMYLASE in the last 168 hours. No results for input(s): AMMONIA in the last 168 hours. Coagulation Profile: No results for input(s): INR, PROTIME in the last 168 hours. Cardiac Enzymes: No results for input(s): CKTOTAL, CKMB, CKMBINDEX, TROPONINI in the last 168 hours. BNP (last 3 results) No results for input(s): PROBNP in the last 8760 hours. HbA1C: No results for input(s): HGBA1C in the last 72 hours. CBG: No results for input(s): GLUCAP in the last 168 hours. Lipid Profile: No results for input(s): CHOL, HDL, LDLCALC, TRIG, CHOLHDL, LDLDIRECT in the last 72 hours. Thyroid Function Tests: No results for input(s): TSH, T4TOTAL, FREET4, T3FREE, THYROIDAB in the last 72 hours. Anemia Panel: No results for input(s): VITAMINB12, FOLATE, FERRITIN, TIBC, IRON, RETICCTPCT in the last 72 hours. Sepsis Labs: No results for input(s): PROCALCITON, LATICACIDVEN in the last 168 hours.  Recent Results (from the past 240 hour(s))  Respiratory Panel by RT PCR (Flu A&B, Covid) - Nasopharyngeal Swab     Status: None   Collection Time: 04/26/20 11:24 AM    Specimen: Nasopharyngeal Swab  Result Value Ref Range Status   SARS Coronavirus 2 by RT PCR NEGATIVE NEGATIVE Final    Comment: (NOTE) SARS-CoV-2 target nucleic acids are NOT DETECTED.  The SARS-CoV-2 RNA is generally detectable in upper respiratoy specimens during the acute phase of infection. The lowest concentration of SARS-CoV-2 viral copies this assay can detect is 131 copies/mL. A negative result does not preclude SARS-Cov-2 infection and should not be used as the sole basis for treatment or other patient management decisions. A negative result may occur with  improper specimen collection/handling, submission of specimen other than nasopharyngeal swab, presence of viral mutation(s) within the areas targeted by this assay, and inadequate number of viral copies (<131  copies/mL). A negative result must be combined with clinical observations, patient history, and epidemiological information. The expected result is Negative.  Fact Sheet for Patients:  PinkCheek.be  Fact Sheet for Healthcare Providers:  GravelBags.it  This test is no t yet approved or cleared by the [place] FDA and  has been authorized for detection and/or diagnosis of SARS-CoV-2 by FDA under an Emergency Use Authorization (EUA). This EUA will remain  in effect (meaning this test can be used) for the duration of the COVID-19 declaration under Section 564(b)(1) of the Act, 21 U.S.C. section 360bbb-3(b)(1), unless the authorization is terminated or revoked sooner.     Influenza A by PCR NEGATIVE NEGATIVE Final   Influenza B by PCR NEGATIVE NEGATIVE Final    Comment: (NOTE) The Xpert Xpress SARS-CoV-2/FLU/RSV assay is intended as an aid in  the diagnosis of influenza from Nasopharyngeal swab specimens and  should not be used as a sole basis for treatment. Nasal washings and  aspirates are unacceptable for Xpert Xpress SARS-CoV-2/FLU/RSV   testing.  Fact Sheet for Patients: PinkCheek.be  Fact Sheet for Healthcare Providers: GravelBags.it  This test is not yet approved or cleared by the [place] FDA and  has been authorized for detection and/or diagnosis of SARS-CoV-2 by  FDA under an Emergency Use Authorization (EUA). This EUA will remain  in effect (meaning this test can be used) for the duration of the  Covid-19 declaration under Section 564(b)(1) of the Act, 21  U.S.C. section 360bbb-3(b)(1), unless the authorization is  terminated or revoked. Performed at [facility], [address] [place], [place] [address]          Radiology Studies: DG CHEST PORT 1 VIEW  Result Date: 04/30/2020 CLINICAL DATA:  Pneumothorax. EXAM: PORTABLE CHEST 1 VIEW COMPARISON:  04/28/2020 FINDINGS: Right-sided chest tube unchanged. Possible persistent tiny right apical pneumothorax. Slight worsening hazy opacification over the right base which may be due to effusion/atelectasis versus infection. Mild overall improvement of mixed interstitial airspace density bilaterally. Stable postsurgical change over the medial right apex. Cardiomediastinal silhouette and remainder of the exam is unchanged. IMPRESSION: 1. Slight worsening hazy opacification right base which may be due to effusion/atelectasis versus infection. 2. Right-sided chest tube unchanged. Possible persistent tiny right apical pneumothorax. 3. Mild overall improvement in mixed interstitial airspace density bilaterally. Electronically Signed   By: Marin Olp M.D.   On: 04/30/2020 09:17        Scheduled Meds: . gabapentin  300 mg Oral TID   And  . gabapentin  600 mg Oral TID  . lisinopril  10 mg Oral Daily  . metoprolol tartrate  12.5 mg Oral BID  . pantoprazole  40 mg Oral Daily  . polyethylene glycol  17 g Oral Daily  . primidone  150 mg Oral BID  . rosuvastatin  10 mg Oral QHS  . traMADol   50-100 mg Oral Q6H   Continuous Infusions:   LOS: 5 days    Time spent: 32 mins    Max Sane, MD [facility] Pager [phone]  If 7PM-7AM, please contact night-coverage [email or web address] Password [facility] 05/01/2020, 3:55 PM

## 2020-05-01 NOTE — Progress Notes (Signed)
Chest tube drainage marked at 129mL at [facility] 05-01-20 at beginning of my shift

## 2020-05-02 ENCOUNTER — Inpatient Hospital Stay: Payer: Medicare Other

## 2020-05-02 DIAGNOSIS — R0600 Dyspnea, unspecified: Secondary | ICD-10-CM

## 2020-05-02 DIAGNOSIS — R0609 Other forms of dyspnea: Secondary | ICD-10-CM

## 2020-05-02 DIAGNOSIS — J95811 Postprocedural pneumothorax: Secondary | ICD-10-CM | POA: Diagnosis not present

## 2020-05-02 DIAGNOSIS — J9621 Acute and chronic respiratory failure with hypoxia: Secondary | ICD-10-CM | POA: Diagnosis not present

## 2020-05-02 DIAGNOSIS — R0602 Shortness of breath: Secondary | ICD-10-CM

## 2020-05-02 DIAGNOSIS — J439 Emphysema, unspecified: Secondary | ICD-10-CM | POA: Diagnosis not present

## 2020-05-02 DIAGNOSIS — M545 Low back pain, unspecified: Secondary | ICD-10-CM | POA: Diagnosis not present

## 2020-05-02 NOTE — Progress Notes (Signed)
Occupational Therapy Treatment Patient Details Name: Joseph Graham MRN: [id number] DOB: Oct 30, 1940 Today's Date: 05/02/2020    History of present illness Joseph Graham is a 79 y.o. 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 Chest x-ray showed recurrent right pneumothorax   OT comments  Pt seen for OT tx this date. Spouse present. RN and Radio producer in room upon OT's arrival. Pt requesting to get to [facility]. Increased time/effort to get EOB. O2 sats in mid 80's on 5L HFNC. Pt refused to wear nasal cannula for transfer from EOB to [facility], desats to mid 70's with the effort. Required MOD A for ADL transfers with MOD verbal cues for hand/foot placement and RW mgt. Strong encouragement and education provided to pt to replace cannula once seated. Back on 5L HF and cues for PLB, it took >2min to achieve 86-88%. RN approved increase to 6L O2, with further PLB instruction, O2 sats improved to 89-90%. MOD A for transfer from [facility], MAX A for pericare and donning brief in standing with nursing student assist; bilateral knees close to buckling requiring cues for knee extension and anterior weight shift to correct posterior lean. Set up and supervision for EOB grooming tasks and UB dressing with O2 sats in low 90's on 6L. Once back to bed, pt left on 5L O2 via HFNC and O2 sats in mid 90's. RN notified. Pt/spouse instructed in BUE there-ex with green theraband (pt did not recall previous instruction at all); educated in current OT discharge recommendations, RW mgt and why recommending 2WW and not his rollator for  initial ADL mobility 2/2 safety concerns; Pt/spouse instructed in PLB and nasal cannula positioning for optimal O2 sats. Pt/spouse verbalized understanding. Given functional deficits and cardiopulmonary status, currently requiring 6L HFNC for any OOB mobility/ADL (2L O2 at baseline), pt far from functional baseline. Pt independent at baseline, now requiring Mod-Max A for dressing, Mod A for transfers and walking a couple of shuffled steps with RW. Pt also now requiring increased time and O2 to recover. Recommending SNF for short term rehab at this time. Pt/spouse educated in OT recommendations and verbalized understanding. Pt reports still planning for return home tomorrow. Case mgr, MD, and RN notified of pt's progress and current OT recommendations. If pt is to return home tomorrow, will benefit from a [facility], 2WW, and shower chair for safety.    Follow Up Recommendations  SNF    Equipment Recommendations  3 in 1 bedside commode;Tub/shower seat;Other (comment) (2WW)    Recommendations for Other Services      Precautions / Restrictions Precautions Precautions: Fall Precaution Comments: chest tube, watch O2 sats Restrictions Weight Bearing Restrictions: No       Mobility Bed Mobility Overal bed mobility: Needs Assistance Bed Mobility: Rolling;Supine to Sit;Sit to Supine Rolling: Mod assist;Min assist   Supine to sit: Min guard Sit to supine: Min assist   General bed mobility comments: increased time/effort, cues for PLB  Transfers Overall transfer level: Needs assistance Equipment used: Rolling walker (2 wheeled) Transfers: Sit to/from Stand Sit to Stand: Mod assist         General transfer comment: cues for scooting EOB before attempt, foot/hand placement,  and some posterior lean requiring cues for improved posture to complete stand    Balance Overall balance assessment: Needs assistance Sitting-balance support: Single extremity supported;No upper extremity supported;Feet  supported Sitting balance-Leahy Scale: Fair   Postural control: Posterior lean Standing balance support: Bilateral upper extremity supported;During functional activity Standing balance-Leahy Scale: Poor Standing balance comment: required assist to maintain static standing with heavy BUE on RW                           ADL either performed or assessed with clinical judgement   ADL Overall ADL's : Needs assistance/impaired                     Lower Body Dressing: Sit to/from stand;Maximal assistance Lower Body Dressing Details (indicate cue type and reason): Max A for donning briefs in standing requiring BUE support on RW Toilet Transfer: Moderate assistance;Minimal assistance;Cueing for sequencing;Cueing for safety;RW;BSC Toilet Transfer Details (indicate cue type and reason): VC for RW mgt during transfer, hands placement, foot placement Toileting- Clothing Manipulation and Hygiene: Maximal assistance;Sit to/from stand Toileting - Clothing Manipulation Details (indicate cue type and reason): Max A from RN for pericare while OT provided physical assist for standing     Functional mobility during ADLs: Moderate assistance;Minimal assistance;Cueing for sequencing;Rolling walker;Cueing for safety       Vision Baseline Vision/History: Wears glasses Wears Glasses: At all times Patient Visual Report: No change from baseline     Perception     Praxis      Cognition Arousal/Alertness: Awake/alert Behavior During Therapy: WFL for tasks assessed/performed Overall Cognitive Status: Impaired/Different from baseline Area of Impairment: Problem solving;Safety/judgement;Orientation                 Orientation Level: Disoriented to;Time       Safety/Judgement: Decreased awareness of safety;Decreased awareness of deficits   Problem Solving: Slow processing;Requires verbal cues          Exercises Other Exercises Other Exercises: toileting w/ BSC next to  bed, Max A in standing for peri-care, Mod A for STS transfers, cues for RW mgt, Max A for donning brief in standing. Heavy BUE support on RW, cues to minimize bilat knee buckling; desats Other Exercises: EOB grooming with set up and supervision for safety, SpO2 in low 90's throughout on 6L O2; set up and supervision for donning shirt overhead Other Exercises: Pt/spouse instructed in BUE ex with green theraband (pt did not recall previous instruction at all); educated in current OT discharge recommendations, RW mgt and why recommending 2WW and not his rollator for initial ADL mobility 2/2 safety concerns; Pt/spouse instructed in PLB and nasal cannula positioning for optimal O2 sats   Shoulder Instructions       General Comments Pt refused to wear nasal cannula for transfer from EOB to BSC, desats to mid 70's, strong encouragement and edu to replace cannula on 5L HF, cues for PLB and taking >19min to achieve 86-88%. RN approved increase to 6L O2 with PLB, improved to 89-90% with exertion. Once returned to bed w/ additional time to rest, O2 turned back to 5L O2 and SpO2 in mid-90's. RN notified.    Pertinent Vitals/ Pain       Pain Assessment: 0-10 Pain Score: 6  Pain Location: low back, down to 3/10 after positioning his cushion Pain Descriptors / Indicators: Grimacing;Discomfort Pain Intervention(s): Limited activity within patient's tolerance;Monitored during session;Repositioned  Home Living  Prior Functioning/Environment              Frequency  Min 1X/week        Progress Toward Goals  OT Goals(current goals can now be found in the care plan section)  Progress towards OT goals: Progressing toward goals  Acute Rehab OT Goals Patient Stated Goal: to get stronger to go home OT Goal Formulation: With patient/family Time For Goal Achievement: 05/15/20 Potential to Achieve Goals: Good  Plan Discharge plan remains  appropriate;Frequency remains appropriate    Co-evaluation                 AM-PAC OT "6 Clicks" Daily Activity     Outcome Measure   Help from another person eating meals?: None Help from another person taking care of personal grooming?: A Little Help from another person toileting, which includes using toliet, bedpan, or urinal?: A Lot Help from another person bathing (including washing, rinsing, drying)?: A Lot Help from another person to put on and taking off regular upper body clothing?: A Little Help from another person to put on and taking off regular lower body clothing?: A Lot 6 Click Score: 16    End of Session Equipment Utilized During Treatment: Gait belt;Rolling walker;Oxygen (5-6L HFNC)  OT Visit Diagnosis: Other abnormalities of gait and mobility (R26.89);Muscle weakness (generalized) (M62.81)   Activity Tolerance Patient tolerated treatment well;Other (comment) (limited by cardiopulmonary status)   Patient Left in bed;with call bell/phone within reach;with bed alarm set;with family/visitor present;with SCD's reapplied;Other (comment) (O2 left at 5L, turned down from 6L during exertion)   Nurse Communication Other (comment);Mobility status (O2 sats and 6L to 5L O2)        Time: 3299-2426 OT Time Calculation (min): 85 min  Charges: OT General Charges $OT Visit: 1 Visit OT Treatments $Self Care/Home Management : 83-97 mins  Jeni Salles, MPH, MS, OTR/L ascom [phone] 05/02/20, 1:16 PM

## 2020-05-02 NOTE — Progress Notes (Addendum)
Patient has agreed to a stay another night to increase strength and arrange proper discharge needed items for home. Wife is agreeable to plan.

## 2020-05-02 NOTE — Progress Notes (Signed)
PROGRESS NOTE    Joseph Graham  KGY:[id number] DOB: Jun 11, 1941 DOA: 04/26/2020 PCP: [name], MD   Assessment & Plan:   Principal Problem:   Recurrent pneumothorax after pleural drain removed Active Problems:   HTN (hypertension)   Coronary artery disease involving native coronary artery of native heart without angina pectoris   COPD (chronic obstructive pulmonary disease) ([place])   Malignant neoplasm of right upper lobe of lung (HCC)   Chronic low back pain (Primary area of Pain) (Bilateral)  (L>R) w/o sciatica   Acute on chronic respiratory failure with hypoxia (HCC)   Bullous emphysema (HCC)   Pneumothorax   Shortness of breath   Dyspnea on exertion  Recurrent pneumothorax: continue w/ chest tube as per general surg. General surg following and recs apprec. S/p talc pleurodesis 04/27/20 as per general surg. S/p chest tube removal on 11/21 and CXR this am shows improvement  Chronic hypoxic respiratory failure: continue on supplemental oxygen and wean back to baseline as tolerated. Uses 2 liters chronically  Acute hypercarbic respiratory failure: CO2 is labile. Weaned off to HFNC 5L currently. I offered to give another 40 mg of lasix but patient refused.  COPD/bullous emphysema. W/o exacerbation. Continue on bronchodilators & encourage incentive spirometry   Lung cancer: undergoing radiation as an outpatient. Management per oncology outpatient   Macrocytic anemia: H&H are trending up today.   HTN: continue on home dose of metoprolol. Continue to hold home dose of lisinopril   CAD: stable. No chest pain. Continue on metoprolol & lisinopril   Chronic low back pain: norco prn   Benign essential tremor: continue on home dose of primidone   Peripheral neuropathy: likely secondary to chemo. Continue on home dose of gabapentin     DVT prophylaxis: SCDs Code Status: full  Family Communication: discussed pt's care w/ wife over phone once when I was in room and called  later in afternoon again on 11/22 Disposition Plan: patient adamantly refuses SNF (PT, OT recommended) and wants to just go home  Status is: Inpatient  Remains inpatient appropriate because:Ongoing diagnostic testing needed not appropriate for outpatient work up, Unsafe d/c plan, IV treatments appropriate due to intensity of illness or inability to take PO and Inpatient level of care appropriate due to severity of illness,   Dispo: The patient is from: Home              Anticipated d/c is to: Home with HHPT              Anticipated d/c date is: 1 day              Patient currently is not medically stable to d/c.  I tried to explain rationale for him to go to SNF or at the least stay here to wean his O2 and wait for some more clinical improvement as he gets hypoxic easily on minimal exertion. Still requiring 5 liters but he remained adamant in leaving and his wife also gave up first in morning. They have family visiting from [place] and he was hoping to receive them at airport which I told him is unrealistic with his current health status.  Later TOC and nursing was able to convince him to stay at least 1 more night so as his [place] and equipment can be set at home. I doubt his O2 requirement will change much in next 24 hrs.   Consultants:   General surg   Procedures: pleurodesis    Antimicrobials:  Subjective: repeatedly asking to release him as he has some family coming over at home, called his wife over phone when I was in room and I talked with her. He remained adamant in leaving despite my best effort to convince him to stay due to his high O2 requirement and being very weak. Says he feels fine and get this condom cath removed as he doesn't like it  Objective: Vitals:   05/02/20 0821 05/02/20 1227 05/02/20 1615 05/02/20 2008  BP: (!) 151/49 (!) 185/82 (!) 131/48 (!) 156/74  Pulse:  88 87 94  Resp: 18 20 20 18   Temp: 98.2 F (36.8 C) 98.3 F (36.8 C) 98.1 F (36.7 C) 98.2  F (36.8 C)  TempSrc: Oral Oral Oral Oral  SpO2:  95% 95% 97%  Weight:      Height:        Intake/Output Summary (Last 24 hours) at 05/02/2020 2150 Last data filed at 05/01/2020 2235 Gross per 24 hour  Intake --  Output 400 ml  Net -400 ml   Filed Weights   05/01/20 0350 05/02/20 0336 05/02/20 0500  Weight: 86.9 kg 86.2 kg 83.6 kg    Examination:  General exam: Appears calm but uncomfortable  Respiratory system: decreased breath sounds b/l. Chest tube in place & draining serosanguinous fluid  Cardiovascular system: S1/S2+. No rubs or gallops  Gastrointestinal system: Abdomen is nondistended, soft and nontender. Hypoactive bowel sounds  Central nervous system: Alert and awake. Moves all 4 extremities  Psychiatry: Judgement and insight appear normal. Appears frustrated & agitated     Data Reviewed: I have personally reviewed following labs and imaging studies  CBC: Recent Labs  Lab 04/27/20 0524 04/27/20 0524 04/27/20 1312 04/28/20 0543 04/29/20 0649 04/30/20 0525 05/01/20 0538  WBC 8.1   < > 10.1 13.7* 8.6 9.2 7.7  NEUTROABS 5.8  --   --   --   --   --   --   HGB 10.9*   < > 12.4* 12.1* 10.5* 11.1* 11.0*  HCT 35.7*   < > 40.4 39.5 33.4* 36.1* 34.9*  MCV 103.2*   < > 103.3* 103.9* 102.5* 103.1* 100.9*  PLT 191   < > 189 190 168 197 201   < > = values in this interval not displayed.   Basic Metabolic Panel: Recent Labs  Lab 04/27/20 1312 04/28/20 0543 04/29/20 0529 04/30/20 0525 05/01/20 0538  NA 141 141 141 142 142  K 4.3 4.3 4.7 4.2 4.4  CL 98 95* 95* 92* 91*  CO2 33* 36* 36* 39* 39*  GLUCOSE 133* 148* 133* 135* 116*  BUN 20 23 23 23 22   CREATININE 0.62 0.75 0.72 0.53* 0.54*  CALCIUM 7.9* 8.1* 8.2* 8.4* 8.6*   GFR: Estimated Creatinine Clearance: 74.9 mL/min (A) (by C-G formula based on SCr of 0.54 mg/dL (L)). Liver Function Tests: No results for input(s): AST, ALT, ALKPHOS, BILITOT, PROT, ALBUMIN in the last 168 hours. No results for input(s):  LIPASE, AMYLASE in the last 168 hours. No results for input(s): AMMONIA in the last 168 hours. Coagulation Profile: No results for input(s): INR, PROTIME in the last 168 hours. Cardiac Enzymes: No results for input(s): CKTOTAL, CKMB, CKMBINDEX, TROPONINI in the last 168 hours. BNP (last 3 results) No results for input(s): PROBNP in the last 8760 hours. HbA1C: No results for input(s): HGBA1C in the last 72 hours. CBG: No results for input(s): GLUCAP in the last 168 hours. Lipid Profile: No results for input(s): CHOL, HDL,  LDLCALC, TRIG, CHOLHDL, LDLDIRECT in the last 72 hours. Thyroid Function Tests: No results for input(s): TSH, T4TOTAL, FREET4, T3FREE, THYROIDAB in the last 72 hours. Anemia Panel: No results for input(s): VITAMINB12, FOLATE, FERRITIN, TIBC, IRON, RETICCTPCT in the last 72 hours. Sepsis Labs: No results for input(s): PROCALCITON, LATICACIDVEN in the last 168 hours.  Recent Results (from the past 240 hour(s))  Respiratory Panel by RT PCR (Flu A&B, Covid) - Nasopharyngeal Swab     Status: None   Collection Time: 04/26/20 11:24 AM   Specimen: Nasopharyngeal Swab  Result Value Ref Range Status   SARS Coronavirus 2 by RT PCR NEGATIVE NEGATIVE Final    Comment: (NOTE) SARS-CoV-2 target nucleic acids are NOT DETECTED.  The SARS-CoV-2 RNA is generally detectable in upper respiratoy specimens during the acute phase of infection. The lowest concentration of SARS-CoV-2 viral copies this assay can detect is 131 copies/mL. A negative result does not preclude SARS-Cov-2 infection and should not be used as the sole basis for treatment or other patient management decisions. A negative result may occur with  improper specimen collection/handling, submission of specimen other than nasopharyngeal swab, presence of viral mutation(s) within the areas targeted by this assay, and inadequate number of viral copies (<131 copies/mL). A negative result must be combined with  clinical observations, patient history, and epidemiological information. The expected result is Negative.  Fact Sheet for Patients:  PinkCheek.be  Fact Sheet for Healthcare Providers:  GravelBags.it  This test is no t yet approved or cleared by the [place] FDA and  has been authorized for detection and/or diagnosis of SARS-CoV-2 by FDA under an Emergency Use Authorization (EUA). This EUA will remain  in effect (meaning this test can be used) for the duration of the COVID-19 declaration under Section 564(b)(1) of the Act, 21 U.S.C. section 360bbb-3(b)(1), unless the authorization is terminated or revoked sooner.     Influenza A by PCR NEGATIVE NEGATIVE Final   Influenza B by PCR NEGATIVE NEGATIVE Final    Comment: (NOTE) The Xpert Xpress SARS-CoV-2/FLU/RSV assay is intended as an aid in  the diagnosis of influenza from Nasopharyngeal swab specimens and  should not be used as a sole basis for treatment. Nasal washings and  aspirates are unacceptable for Xpert Xpress SARS-CoV-2/FLU/RSV  testing.  Fact Sheet for Patients: PinkCheek.be  Fact Sheet for Healthcare Providers: GravelBags.it  This test is not yet approved or cleared by the [place] FDA and  has been authorized for detection and/or diagnosis of SARS-CoV-2 by  FDA under an Emergency Use Authorization (EUA). This EUA will remain  in effect (meaning this test can be used) for the duration of the  Covid-19 declaration under Section 564(b)(1) of the Act, 21  U.S.C. section 360bbb-3(b)(1), unless the authorization is  terminated or revoked. Performed at [facility], [address], [place], [place] [address]          Radiology Studies: DG Chest [place] 1 View  Result Date: 05/02/2020 CLINICAL DATA:  Cough EXAM: PORTABLE CHEST 1 VIEW COMPARISON:  04/30/2020 FINDINGS: Interval removal  of right chest tube. No pneumothorax. Heart is normal size. Interstitial prominence throughout the lungs, similar to prior study. Small right pleural effusion. IMPRESSION: Interval removal of right chest tube without pneumothorax. Small right pleural effusion. Stable diffuse interstitial prominence. Electronically Signed   By: Rolm Baptise M.D.   On: 05/02/2020 08:09        Scheduled Meds: . gabapentin  300 mg Oral TID   And  .  gabapentin  600 mg Oral TID  . lisinopril  10 mg Oral Daily  . metoprolol tartrate  12.5 mg Oral BID  . pantoprazole  40 mg Oral Daily  . polyethylene glycol  17 g Oral Daily  . primidone  150 mg Oral BID  . rosuvastatin  10 mg Oral QHS  . traMADol  50-100 mg Oral Q6H   Continuous Infusions:   LOS: 6 days    Time spent: 32 mins    Max Sane, MD [facility] Pager [phone]  If 7PM-7AM, please contact night-coverage [email or web address] Password [facility] 05/02/2020, 9:50 PM

## 2020-05-02 NOTE — Progress Notes (Signed)
Plan for patient to discharge as he is requesting to go home and refusing SNF. PT unable to move patient from bed to chair Sunday due to weakness and decreased oxygen saturation when moving in bed. Tried to wean patient down from 7L Sunday. Lowest to obtain was 5L. Patient frequently destats to 84-86% on 5L.  OT at bedside this morning helping transfer patient from bed to [facility]. Patient unstable on feet requiring a max support from OT to maintain balance. Shuffled feet back to get on BSC. Patient only uses cane at home for ambulation at baseline.  Wife at bedside expresses concerns about safety of patient at home and limited physical ability to care for patient.  MD Manuella Ghazi is aware of situation.  At this time, Patient still on 5L and requiring 6L when attempting to ambulate to [facility] to maintain stats at 89-90

## 2020-05-02 NOTE — Progress Notes (Signed)
Patient ID: Joseph Graham, male   DOB: 01/11/1941, 79 y.o.   MRN: [id number]  Doing much better.  Back to baseline status.  Dressings clean and dry  CXRay looks good  Ready for discharge from our standpoint.  Would like to see in the office for suture removal in one week.

## 2020-05-02 NOTE — Discharge Instructions (Signed)
Pneumothorax A pneumothorax is commonly called a collapsed lung. It is a condition in which air leaks from a lung and builds up between the thin layer of tissue that covers the lungs (visceral pleura) and the interior wall of the chest cavity (parietal pleura). The air gets trapped outside the lung, between the lung and the chest wall (pleural space). The air takes up space and prevents the lung from fully expanding. This condition sometimes occurs suddenly with no apparent cause. The buildup of air may be small or large. A small pneumothorax may go away on its own. A large pneumothorax will require treatment and hospitalization. What are the causes? This condition may be caused by:  Trauma and injury to the chest wall.  Surgery and other medical procedures.  A complication of an underlying lung problem, especially chronic obstructive pulmonary disease (COPD) or emphysema. Sometimes the cause of this condition is not known. What increases the risk? You are more likely to develop this condition if:  You have an underlying lung problem.  You smoke.  You are 6-73 years old, male, tall, and underweight.  You have a personal or family history of pneumothorax.  You have an eating disorder (anorexia nervosa). This condition can also happen quickly, even in people with no history of lung problems. What are the signs or symptoms? Sometimes a pneumothorax will have no symptoms. When symptoms are present, they can include:  Chest pain.  Shortness of breath.  Increased rate of breathing.  Bluish color to your lips or skin (cyanosis). How is this diagnosed? This condition may be diagnosed by:  A medical history and physical exam.  A chest X-ray, chest CT scan, or ultrasound. How is this treated? Treatment depends on how severe your condition is. The goal of treatment is to remove the extra air and allow your lung to expand back to its normal size.  For a small pneumothorax: ? No  treatment may be needed. ? Extra oxygen is sometimes used to make it go away more quickly.  For a large pneumothorax or a pneumothorax that is causing symptoms, a procedure is done to drain the air from your lungs. To do this, a health care provider may use: ? A needle with a syringe. This is used to suck air from a pleural space where no additional leakage is taking place. ? A chest tube. This is used to suck air where there is ongoing leakage into the pleural space. The chest tube may need to remain in place for several days until the air leak has healed.  In more severe cases, surgery may be needed to repair the damage that is causing the leak.  If you have multiple pneumothorax episodes or have an air leak that will not heal, a procedure called a pleurodesis may be done. A medicine is placed in the pleural space to irritate the tissues around the lung so that the lung will stick to the chest wall, seal any leaks, and stop any buildup of air in that space. If you have an underlying lung problem, severe symptoms, or a large pneumothorax you will usually need to stay in the hospital. Follow these instructions at home: Lifestyle  Do not use any products that contain nicotine or tobacco, such as cigarettes and e-cigarettes. These are major risk factors in pneumothorax. If you need help quitting, ask your health care provider.  Do not lift anything that is heavier than 10 lb (4.5 kg), or the limit that your health care  provider tells you, until he or she says that it is safe.  Avoid activities that take a lot of effort (strenuous) for as long as told by your health care provider.  Return to your normal activities as told by your health care provider. Ask your health care provider what activities are safe for you.  Do not fly in an airplane or scuba dive until your health care provider says it is okay. General instructions  Take over-the-counter and prescription medicines only as told by your  health care provider.  If a cough or pain makes it difficult for you to sleep at night, try sleeping in a semi-upright position in a recliner or by using 2 or 3 pillows.  If you had a chest tube and it was removed, ask your health care provider when you can remove the bandage (dressing). While the dressing is in place, do not allow it to get wet.  Keep all follow-up visits as told by your health care provider. This is important. Contact a health care provider if:  You cough up thick mucus (sputum) that is yellow or green in color.  You were treated with a chest tube, and you have redness, increasing pain, or discharge at the site where it was placed. Get help right away if:  You have increasing chest pain or shortness of breath.  You have a cough that will not go away.  You begin coughing up blood.  You have pain that is getting worse or is not controlled with medicines.  The site where your chest tube was located opens up.  You feel air coming out of the site where the chest tube was placed.  You have a fever or persistent symptoms for more than 2-3 days.  You have a fever and your symptoms suddenly get worse. These symptoms may represent a serious problem that is an emergency. Do not wait to see if the symptoms will go away. Get medical help right away. Call your local emergency services (911 in the [place] Do not drive yourself to the hospital. Summary  A pneumothorax, commonly called a collapsed lung, is a condition in which air leaks from a lung and gets trapped between the lung and the chest wall (pleural space).  The buildup of air may be small or large. A small pneumothorax may go away on its own. A large pneumothorax will require treatment and hospitalization.  Treatment for this condition depends on how severe the pneumothorax is. The goal of treatment is to remove the extra air and allow the lung to expand back to its normal size. This information is not intended to  replace advice given to you by your health care provider. Make sure you discuss any questions you have with your health care provider. Document Revised: 05/10/2017 Document Reviewed: 05/06/2017 Elsevier Patient Education  2020 [employer].

## 2020-05-02 NOTE — TOC Transition Note (Signed)
Transition of Care [facility]) - CM/SW Discharge Note   Patient Details  Name: Joseph Graham MRN: [id number] Date of Birth: 13-Jun-1940  [facility] CM/SW Contact:  Meriel Flavors, LCSW Phone Number: 05/02/2020, 1:12 PM   Clinical Narrative:    CSW notified by patient's wife, Fraser Din patient was being discharged this morning at 9:00 am. CSW had not received any notification/TOC consult prior to hearing from patient's wife on the phone he was disharged. CSW messaged attending to inquire about d/c. Attending responded, yes he is discharging and can you set up HH/PT for him. CSW checked and d/c summary was completed. CSW acknowledged patient had been recommended for SNF by both PT and OT. In addition patient is requiring 6L of oxygen at rest. CSW spoke with patient about SNF placement, patient adamantly refused, stating he just wants to go home now! CSW confirmed understanding and left the room.  In speaking with the patient's wife, his nurse,attending,  OT and PT everyone agreed this was not a safe discharge plan. CSW, nurse and OT went together to discuss with patient and were able to convince patient to stay in the hospital one more day so PT/OT can work with him more to improve ambulation.   Patient will likely discharge 11/23 [facility] is providing oxygen/DME [facility] is providing HH/PT        Patient Goals and CMS Choice        Discharge Placement                       Discharge Plan and Services                                     Social Determinants of Health (SDOH) Interventions     Readmission Risk Interventions No flowsheet data found.

## 2020-05-02 NOTE — Care Management Important Message (Signed)
Important Message  Patient Details  Name: Joseph Graham MRN: [id number] Date of Birth: 07-08-1940   Medicare Important Message Given:  Yes     Dannette Barbara 05/02/2020, 12:24 PM

## 2020-05-03 ENCOUNTER — Ambulatory Visit: Payer: Medicare Other

## 2020-05-03 ENCOUNTER — Encounter: Payer: Self-pay | Admitting: Family Medicine

## 2020-05-03 DIAGNOSIS — J9311 Primary spontaneous pneumothorax: Secondary | ICD-10-CM

## 2020-05-03 DIAGNOSIS — J449 Chronic obstructive pulmonary disease, unspecified: Secondary | ICD-10-CM

## 2020-05-03 DIAGNOSIS — R0602 Shortness of breath: Secondary | ICD-10-CM | POA: Diagnosis not present

## 2020-05-03 DIAGNOSIS — R0600 Dyspnea, unspecified: Secondary | ICD-10-CM

## 2020-05-03 DIAGNOSIS — J95811 Postprocedural pneumothorax: Secondary | ICD-10-CM | POA: Diagnosis not present

## 2020-05-03 NOTE — Discharge Summary (Signed)
[place] [facility] [place] NAME: Joseph Graham    MR#:  [id number]  DATE OF BIRTH:  01-29-1941  DATE OF ADMISSION:  04/26/2020   ADMITTING PHYSICIAN: Athena Masse, MD  DATE OF DISCHARGE: 05/03/2020  2:54 PM  PRIMARY CARE PHYSICIAN: [name], MD   ADMISSION DIAGNOSIS:  Shortness of breath [R06.02] Dyspnea on exertion [R06.00] Primary spontaneous pneumothorax [J93.11] Pneumothorax [J93.9] Chronic obstructive pulmonary disease, unspecified COPD type ([place]) [J44.9] DISCHARGE DIAGNOSIS:  Principal Problem:   Recurrent pneumothorax after pleural drain removed Active Problems:   HTN (hypertension)   Coronary artery disease involving native coronary artery of native heart without angina pectoris   COPD (chronic obstructive pulmonary disease) (HCC)   Malignant neoplasm of right upper lobe of lung (HCC)   Chronic low back pain (Primary area of Pain) (Bilateral)  (L>R) w/o sciatica   Acute on chronic respiratory failure with hypoxia (HCC)   Bullous emphysema (HCC)   Pneumothorax   Shortness of breath   Dyspnea on exertion  SECONDARY DIAGNOSIS:   Past Medical History:  Diagnosis Date  . Alcohol abuse, in remission    remote   . Arthritis    BACK AND NECK  . Benign essential tremor    improved on metoprolol and gabapentin  . BPH (benign prostatic hypertrophy) 12/30/2012  . Cardiac arrhythmia 03/2010   h/o a flutter and CM, s/p ablation, normal stress test  . Cataract    bilat removed   . Chemotherapy-induced neutropenia ([place]) 09/23/2015  . Chronic low back pain    MRI 2004, multilevel DDD  . Colon polyps    next colonoscopy due around 2018  . Complication of anesthesia age 1-23   DID NOT GET COMPLETELY NUMB WITH TONSILLECTOMY  . COPD (chronic obstructive pulmonary disease) ([place])   . Coronary artery disease   . Dyslipidemia    mild off meds  . GERD (gastroesophageal reflux disease)   . History of kidney stones    h/o  . HTN  (hypertension)   . Macular degeneration, bilateral   . Multiple pulmonary nodules 02/2015  . Neuromuscular disorder ([place])    neuropathy from chemo....in feet  . Peripheral neuropathy due to chemotherapy ([place]) 12/23/2015   Saw Dr Manuella Ghazi, on gabapentin and cymbalta with hydrocodone PRN (03/2016)  . Personal history of tobacco use, presenting hazards to health 02/18/2015   quit 06/2011, restarted 2014  . Pneumothorax after biopsy 04/01/2015  . Pre-diabetes   . Primary cancer of bladder ([place]) 2016   Budzyn  . Primary lung cancer ([place]) 2016   port a cath removed 03/2016  . Shortness of breath dyspnea    OCC WITH EXERTION   HOSPITAL COURSE:  79 y.o. male with medical history significant for right-sided lung cancer status post chemotherapy currently on radiation therapy, COPD/bullous emphysema on home O2 at 2 L, chronic back pain, benign essential tremor, coronary artery disease, peripheral neuropathy secondary to chemotherapy, previous history of pneumothorax, hospitalized from 11/5-11/8 with large pneumothorax treated with chest tube placement  readmitted for worsening shortness of breath similar to when he presented on 11/5. Patient is normally on 2 L of oxygen at home.  He was requiring up to 4 L on admission and was found to have pneumothorax.  Recurrent pneumothorax: Status post chest tube placement on 11/16 under CT guidance by interventional radiology. S/p talc pleurodesis 04/27/20. S/p chest tube removal on 11/21 and CXR after removal of chest tube shows  improvement  Acute on chronic hypoxic respiratory failure: He uses 2 L oxygen via nasal cannula chronically and had required 5 to 7 L via high flow nasal cannula at some point in the hospitalization he had also required BiPAP but he has been weaned off and on the day of discharge he was back on 2 L oxygen only he may require 3 to 4 L on exertion at discharge  COPD/bullous emphysema. W/o exacerbation. Continue on bronchodilators & encourage  incentive spirometry   Lung cancer: undergoing radiation as an outpatient. Management per oncology outpatient   Macrocytic anemia: Stable  HTN: continue on home dose of metoprolol.   CAD: stable. No chest pain. Continue on metoprolol & lisinopril   Chronic low back pain: norco prn   Benign essential tremor: continue on home dose of primidone   Peripheral neuropathy: likely secondary to chemo. Continue on home dose of gabapentin     Please note patient was recommended SNF but patient adamantly refuse and requested discharge home. DISCHARGE CONDITIONS:  Stable CONSULTS OBTAINED:  Treatment Team:  Fredirick Maudlin, MD DRUG ALLERGIES:  No Known Allergies DISCHARGE MEDICATIONS:   Allergies as of 05/03/2020   No Known Allergies     Medication List    STOP taking these medications   fluticasone 50 MCG/ACT nasal spray Commonly known as: FLONASE     TAKE these medications   albuterol 108 (90 Base) MCG/ACT inhaler Commonly known as: ProAir HFA Inhale 2 puffs into the lungs every 6 (six) hours as needed for wheezing or shortness of breath.   ASPIRIN 81 PO Take 1 tablet by mouth daily.   gabapentin 300 MG capsule Commonly known as: NEURONTIN Take 300 mg by mouth 3 (three) times daily. (Take with 600 mg for total of 900 mg three times daily)   gabapentin 600 MG tablet Commonly known as: NEURONTIN Take 600 mg by mouth 3 (three) times daily. (Take with 300 mg for total of 900 mg three times daily)   HYDROcodone-acetaminophen 10-325 MG tablet Commonly known as: NORCO Take 1 tablet by mouth every 6 (six) hours as needed for moderate pain or severe pain.   lisinopril 10 MG tablet Commonly known as: ZESTRIL TAKE 1 TABLET BY MOUTH EVERY EVENING   metoprolol tartrate 25 MG tablet Commonly known as: LOPRESSOR TAKE 1/2 TABLET BY MOUTH TWICE DAILY   multivitamin with minerals Tabs tablet Take 1 tablet by mouth every evening.   naloxone 4 MG/0.1ML Liqd nasal spray  kit Commonly known as: NARCAN 1 spray into nostril upon signs of opioid overdose. Call 911. May repeat once if no response within 2-3 minutes.   NON FORMULARY Take 500 mg by mouth daily. Hemp Oil 500 mg   omeprazole 20 MG capsule Commonly known as: PRILOSEC Take 20 mg by mouth every morning.   OXYGEN Inhale 2 L into the lungs daily.   oxymetazoline 0.05 % nasal spray Commonly known as: AFRIN Place 1 spray into both nostrils 2 (two) times daily.   polyethylene glycol 17 g packet Commonly known as: MIRALAX / GLYCOLAX Take 17 g by mouth daily.   primidone 50 MG tablet Commonly known as: MYSOLINE Take 150 mg by mouth 2 (two) times daily.   rosuvastatin 10 MG tablet Commonly known as: Crestor Take 1 tablet (10 mg total) by mouth daily.   vitamin B-12 100 MCG tablet Commonly known as: CYANOCOBALAMIN Take 100 mcg by mouth every evening.   Vitamin D-3 125 MCG (5000 UT) Tabs Take 5,000 Units by  mouth daily.   vitamin E 180 MG (400 UNITS) capsule Take 400 Units by mouth every evening.            Durable Medical Equipment  (From admission, onward)         Start     Ordered   05/03/20 1223  For home use only DME oxygen  Once       Question Answer Comment  Length of Need 6 Months   Mode or (Route) Nasal cannula   Liters per Minute 4   Frequency Continuous (stationary and portable oxygen unit needed)   Oxygen conserving device Yes   Oxygen delivery system Gas      05/03/20 1222   05/03/20 1003  For home use only DME Walker rolling  Once       Question Answer Comment  Walker: With [place] Wheels   Patient needs a walker to treat with the following condition Ambulatory dysfunction      05/03/20 1003   05/03/20 1003  For home use only DME 3 n 1  Once        05/03/20 1003         DISCHARGE INSTRUCTIONS:   DIET:  Cardiac diet DISCHARGE CONDITION:  Stable ACTIVITY:  Activity as tolerated OXYGEN:  Home Oxygen: Yes.    Oxygen Delivery: 2 liters/min via  Patient connected to nasal cannula oxygen DISCHARGE LOCATION:  home with home health PT, OT and palliative care to follow.    If you experience worsening of your admission symptoms, develop shortness of breath, life threatening emergency, suicidal or homicidal thoughts you must seek medical attention immediately by calling 911 or calling your MD immediately  if symptoms less severe.  You Must read complete instructions/literature along with all the possible adverse reactions/side effects for all the Medicines you take and that have been prescribed to you. Take any new Medicines after you have completely understood and accpet all the possible adverse reactions/side effects.   Please note  You were cared for by a hospitalist during your hospital stay. If you have any questions about your discharge medications or the care you received while you were in the hospital after you are discharged, you can call the unit and asked to speak with the hospitalist on call if the hospitalist that took care of you is not available. Once you are discharged, your primary care physician will handle any further medical issues. Please note that NO REFILLS for any discharge medications will be authorized once you are discharged, as it is imperative that you return to your primary care physician (or establish a relationship with a primary care physician if you do not have one) for your aftercare needs so that they can reassess your need for medications and monitor your lab values.    On the day of Discharge:  VITAL SIGNS:  Blood pressure (!) 125/46, pulse 62, temperature (!) 97.3 F (36.3 C), temperature source Oral, resp. rate 19, height _0  (1.753 m), weight 85.3 kg, SpO2 91 %. PHYSICAL EXAMINATION:  GENERAL:  79 y.o.-year-old patient lying in the bed with no acute distress.  EYES: Pupils equal, round, reactive to light and accommodation. No scleral icterus. Extraocular muscles intact.  HEENT: Head atraumatic,  normocephalic. Oropharynx and nasopharynx clear.  NECK:  Supple, no jugular venous distention. No thyroid enlargement, no tenderness.  LUNGS: Normal breath sounds bilaterally, no wheezing, rales,rhonchi or crepitation. No use of accessory muscles of respiration.  CARDIOVASCULAR: S1, S2 normal. No murmurs, rubs,  or gallops.  ABDOMEN: Soft, non-tender, non-distended. Bowel sounds present. No organomegaly or mass.  EXTREMITIES: No pedal edema, cyanosis, or clubbing.  NEUROLOGIC: Cranial nerves II through XII are intact. Muscle strength 5/5 in all extremities. Sensation intact. Gait not checked.  PSYCHIATRIC: The patient is alert and oriented x 3.  SKIN: No obvious rash, lesion, or ulcer.  DATA REVIEW:   CBC Recent Labs  Lab 05/01/20 0538  WBC 7.7  HGB 11.0*  HCT 34.9*  PLT 201    Chemistries  Recent Labs  Lab 05/01/20 0538  NA 142  K 4.4  CL 91*  CO2 39*  GLUCOSE 116*  BUN 22  CREATININE 0.54*  CALCIUM 8.6*     Outpatient follow-up  Follow-up Information    [name], MD. Schedule an appointment as soon as possible for a visit in 2 weeks.   Specialty: Family Medicine Why: Patient will need to make a follow up appointment. Contact information: [place] [address] [phone]        [name], MD. Schedule an appointment as soon as possible for a visit on 05/20/2020.   Specialties: Cardiothoracic Surgery, General Surgery Why: @ 8am Contact information: [address] [place] [address] [phone]                Management plans discussed with the patient, family and they are in agreement.  CODE STATUS: Full Code   TOTAL TIME TAKING CARE OF THIS PATIENT: 45 minutes.    Max Sane M.D on 05/03/2020 at 5:09 PM  [facility]   CC: Primary care physician; [name], MD   Note: This dictation was prepared with Dragon dictation along with smaller phrase technology. Any  transcriptional errors that result from this process are unintentional.

## 2020-05-03 NOTE — Progress Notes (Signed)
Occupational Therapy Treatment Patient Details Name: Joseph Graham MRN: [id number] DOB: 1940-11-03 Today's Date: 05/03/2020    History of present illness Joseph Graham is a 79 y.o. 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 Chest x-ray showed recurrent right pneumothorax   OT comments  Pt seen for OT tx this date. Wife present. Pt reporting feeling much better. Pt/spouse further instructed in falls prevention, DME safety and recommendations, limiting use of lift recliner to lift him up and down and instead rely more on his BLE strength. Also educated in energy conservation strategies including PLB with emphasis on factors outside of physical exertion that impact his O2 sats including eating, drinking, and talking, especially with Thanksgiving holiday coming up where pt intends to spend time with family. Encouraged implementation of strategies to maximize safety and participation in meaningful family time over the holiday while also prioritizing rest breaks as needed to ensure he doesn't over do it; both verbalized understanding. Respiratory therapist in room, transitioned pt to regular nasal cannula and decreased O2 from 4L to 2L with SpO2 remaining 88-92%. Pt progressing towards goals. Continue to recommend SNF however pt refusing. If going home, recommend [place] services.    Follow Up Recommendations  SNF;Home health OT (pt refusing SNF despite recommendation; would benefit from [facility])    Equipment Recommendations  3 in 1 bedside commode;Tub/shower seat;Other (comment) (2WW)     Recommendations for Other Services      Precautions / Restrictions Precautions Precautions: Fall Precaution Comments: watch O2 Restrictions Weight Bearing Restrictions: No       Mobility Bed Mobility Overal bed mobility: Needs Assistance Bed Mobility: Rolling Rolling: Min assist   Supine to sit: HOB elevated;Supervision     General bed mobility comments: With [facility] lowered and cues for hand placement, pt able to push himself up in bed, Min A for log roll for therapist to reposition linens for improved comfort  Transfers Overall transfer level: Needs assistance Equipment used: Rolling walker (2 wheeled) Transfers: Sit to/from Stand Sit to Stand: Min guard;From elevated surface         General transfer comment: pt did demonstrated improved ability to STS from elevated bed height and from [facility]    Balance Overall balance assessment: Needs assistance Sitting-balance support: Feet supported Sitting balance-Leahy Scale: Good Sitting balance - Comments: no LOB in sitting without UE support   Standing balance support: Bilateral upper extremity supported;During functional activity Standing balance-Leahy Scale: Fair Standing balance comment: required assist to maintain static standing with heavy BUE on RW                           ADL either performed or assessed with clinical judgement   ADL                                               Vision       Perception     Praxis      Cognition Arousal/Alertness: Awake/alert Behavior During Therapy: [facility] for tasks assessed/performed  Overall Cognitive Status: Within Functional Limits for tasks assessed                                     Exercises Other Exercises Other Exercises: Pt further instructed in falls prevention, DME safety and recommendations, limiting use of lift recliner to lift him up and down and instead rely more on his BLE strength Other Exercises: Pt/spouse  educated in PLB with emphasis on factors outside of physical exertion that impact his O2 sats including eating, drinking, talking, especially with holiday coming up. Encouraged energy conservation strategies to maximize safety and participation in meaningful family time over the holiday while also prioritizing rest breaks as needed to ensure he doesn't over do it; both verbalized understanding   Shoulder Instructions       General Comments Respiratory therapist in room, transitioned pt to regular nasal cannula and decreased O2 from 4L to 2L with SpO2 remaining 88-92%.    Pertinent Vitals/ Pain       Pain Assessment: No/denies pain Pain Score: 0-No pain Faces Pain Scale: No hurt Pain Intervention(s): Limited activity within patient's tolerance;Premedicated before session;Monitored during session;Repositioned;Ice applied  Home Living                                          Prior Functioning/Environment              Frequency  Min 1X/week        Progress Toward Goals  OT Goals(current goals can now be found in the care plan section)  Progress towards OT goals: Progressing toward goals  Acute Rehab OT Goals Patient Stated Goal: to get stronger to go home OT Goal Formulation: With patient/family Time For Goal Achievement: 05/15/20 Potential to Achieve Goals: Good  Plan Discharge plan remains appropriate;Frequency remains appropriate    Co-evaluation                 AM-PAC OT "6 Clicks" Daily Activity     Outcome Measure   Help from another person eating meals?: None Help from another person taking care of personal grooming?: None Help from another person toileting, which includes using toliet, bedpan, or urinal?: A Little Help from another person bathing (including washing, rinsing, drying)?: A Lot Help from another person to put on and taking off regular upper body clothing?: A Little Help from another person to put on and taking off regular  lower body clothing?: A Lot 6 Click Score: 18    End of Session Equipment Utilized During Treatment: Oxygen  OT Visit Diagnosis: Other abnormalities of gait and mobility (R26.89);Muscle weakness (generalized) (M62.81)   Activity Tolerance Patient tolerated treatment well   Patient Left in bed;with call bell/phone within reach;with bed alarm set;with family/visitor present   Nurse Communication          Time: 1040-1103 OT Time Calculation (min): 23 min  Charges: OT General Charges $OT Visit: 1 Visit OT Treatments $Self Care/Home Management : 23-37 mins  Jeni Salles, MPH, MS, OTR/L ascom [phone] 05/03/20, 11:21 AM

## 2020-05-03 NOTE — Progress Notes (Signed)
Physical Therapy Treatment Patient Details Name: Joseph Graham MRN: [id number] DOB: 11-09-1940 Today's Date: 05/03/2020    History of Present Illness Joseph Graham is a 79 y.o. 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 Chest x-ray showed recurrent right pneumothorax    PT Comments    Pt was long sitting in bed upon arriving. He agrees to PT session and is cooperative throughout. MD and RN arrived during session. Pt was on 5 L O2 HFNC upon arriving and able to tolerate wean to 4 without desaturation.RN aware of wean and pt tolerance. He was able to exit bed without physical assistance. stood form elevated bed height and took ~ 4 steps to [facility] prior to standing and ambulating to doorway of room and return. Pt sao2 dropped to 90% during ambulation on 4 L HFNC but quickly elevated to mid 90s with seated rest. He was sitting in recliner at conclusion of session and states that he is agreeable to [facility] services but continues to refused recommendation to DC to SNF.  Pt will continue to benefit from skilled PT at DC to address strength, balance, and endurance deficits while assisting pt back to PLOF.     Follow Up Recommendations  Other (comment);Supervision/Assistance - 24 hour;Supervision for mobility/OOB;Supervision - Intermittent;Home health PT;SNF (Pt refuses SNF even when highly recommended)     Equipment Recommendations  None recommended by PT    Recommendations for Other Services       Precautions / Restrictions Precautions Precautions: Fall Precaution Comments: watch  O2 Restrictions Weight Bearing Restrictions: No    Mobility  Bed Mobility Overal bed mobility: Needs Assistance Bed Mobility: Supine to Sit     Supine to sit: HOB elevated;Supervision     General bed mobility comments: pt was able to exit bed with supervision only however did have HOB elevated and used at bedrails. (pt sleeps in lift chair at home (lift recliner))  Transfers Overall transfer level: Needs assistance Equipment used: Rolling walker (2 wheeled) Transfers: Sit to/from Stand Sit to Stand: Min guard;From elevated surface         General transfer comment: pt did demonstrated improved ability to STS from elevated bed height and from [facility]  Ambulation/Gait Ambulation/Gait assistance: Supervision Gait Distance (Feet): 25 Feet Assistive device: Rolling walker (2 wheeled) Gait Pattern/deviations: Trunk flexed;Step-through pattern Gait velocity: decreased   General Gait Details: pt was able to ambulate to doorway and return ~ 25 ft with 4 L o2 + sao2 maintained > 90%. does endorse fatigue with limited distances.       Balance Overall balance assessment: Needs assistance Sitting-balance support: Feet supported Sitting balance-Leahy Scale: Good Sitting balance - Comments: no LOB in sitting without UE support   Standing balance support: Bilateral upper extremity supported;During functional activity Standing balance-Leahy Scale: Fair Standing balance comment: required assist to maintain static standing with heavy BUE on RW         Cognition Arousal/Alertness: Awake/alert Behavior During Therapy: WFL for tasks assessed/performed Overall Cognitive Status: No family/caregiver present to determine baseline cognitive functioning Area of Impairment: Problem solving;Safety/judgement;Orientation      Orientation Level: Disoriented to;Situation;Time       Safety/Judgement: Decreased  awareness of safety;Decreased awareness of deficits   Problem Solving: Slow  processing;Requires verbal cues General Comments: Pt is A & O x 4             Pertinent Vitals/Pain Pain Assessment: No/denies pain Pain Score: 0-No pain Faces Pain Scale: No hurt Pain Intervention(s): Limited activity within patient's tolerance;Premedicated before session;Monitored during session;Repositioned;Ice applied           PT Goals (current goals can now be found in the care plan section) Acute Rehab PT Goals Patient Stated Goal: to get stronger to go home Progress towards PT goals: Progressing toward goals    Frequency    Min 2X/week      PT Plan Current plan remains appropriate       AM-PAC PT "6 Clicks" Mobility   Outcome Measure  Help needed turning from your back to your side while in a flat bed without using bedrails?: A Little Help needed moving from lying on your back to sitting on the side of a flat bed without using bedrails?: A Little Help needed moving to and from a bed to a chair (including a wheelchair)?: A Little Help needed standing up from a chair using your arms (e.g., wheelchair or bedside chair)?: A Little Help needed to walk in hospital room?: A Little Help needed climbing 3-5 steps with a railing? : A Lot 6 Click Score: 17    End of Session Equipment Utilized During Treatment: Oxygen (4 L HFNC) Activity Tolerance: Patient tolerated treatment well Patient left: in chair;with call bell/phone within reach;with chair alarm set;with nursing/sitter in room Nurse Communication: Mobility status PT Visit Diagnosis: Unsteadiness on feet (R26.81);Other abnormalities of gait and mobility (R26.89);Muscle weakness (generalized) (M62.81);History of falling (Z91.81);Pain     Time: 7062-3762 PT Time Calculation (min) (ACUTE ONLY): 45 min  Charges:  $Gait Training: 8-22 mins $Therapeutic Activity: 23-37 mins                     Julaine Fusi PTA 05/03/20, 9:46 AM

## 2020-05-03 NOTE — Progress Notes (Signed)
SATURATION QUALIFICATIONS  Patient Saturations on Room Air at Rest = 88%  Patient Saturations on Room Air while Ambulating = 88%  Patient Saturations on 4 Liters of oxygen while Ambulating.

## 2020-05-03 NOTE — Progress Notes (Signed)
[facility] Liaison note:  New referral for [facility] outpatient Palliative services to follow at home received from attending physician Dr. Manuella Ghazi. [place] made aware. Plan is for discharge today with [facility] home health following. Thank you for this referral. Flo Shanks BSN, RN, [place] [phone]

## 2020-05-04 ENCOUNTER — Telehealth: Payer: Self-pay

## 2020-05-04 ENCOUNTER — Encounter: Payer: Self-pay | Admitting: Family Medicine

## 2020-05-04 ENCOUNTER — Telehealth (INDEPENDENT_AMBULATORY_CARE_PROVIDER_SITE_OTHER): Payer: Medicare Other | Admitting: Family Medicine

## 2020-05-04 VITALS — Ht 69.0 in | Wt 180.0 lb

## 2020-05-04 DIAGNOSIS — R300 Dysuria: Secondary | ICD-10-CM | POA: Insufficient documentation

## 2020-05-04 LAB — POC URINALSYSI DIPSTICK (AUTOMATED)
Bilirubin, UA: NEGATIVE
Blood, UA: NEGATIVE
Glucose, UA: NEGATIVE
Leukocytes, UA: NEGATIVE
Nitrite, UA: NEGATIVE
Protein, UA: POSITIVE — AB
Spec Grav, UA: 1.02 (ref 1.010–1.025)
Urobilinogen, UA: 0.2 E.U./dL
pH, UA: 6 (ref 5.0–8.0)

## 2020-05-04 NOTE — Telephone Encounter (Signed)
Langley Gauss returning call.  I informed Dr. Darnell Level is giving verbal orders she requested.  She expresses her thanks.

## 2020-05-04 NOTE — Assessment & Plan Note (Signed)
UA today reassuring pointing against infection. Reassuringly symptoms are improving. Possibly just irritation from condom cath.  Rec avoid bladder irritants, increase water intake, let us know if persistent or worsening symptoms. Should continue to improve each day.

## 2020-05-04 NOTE — Telephone Encounter (Signed)
Called patient to schedule lab appointment for urine drop off. LVM to call back and schedule.

## 2020-05-04 NOTE — Progress Notes (Signed)
Patient ID: Joseph Graham, male    DOB: 16-Apr-1941, 79 y.o.   MRN: [id number]  Virtual visit completed through [place], a video enabled telemedicine application. Due to national recommendations of social distancing due to COVID-19, a virtual visit is felt to be most appropriate for this patient at this time. Reviewed limitations, risks, security and privacy concerns of performing a virtual visit and the availability of in person appointments. I also reviewed that there may be a patient responsible charge related to this service. The patient agreed to proceed.   Patient location: home Provider location: [place] at [facility], office Persons participating in this virtual visit: patient, provider   If any vitals were documented, they were collected by patient at home unless specified below.    Ht 5\' 9"  (1.753 m)   Wt 180 lb (81.6 kg)   BMI 26.58 kg/m    CC: dysuria Subjective:   HPI: Joseph Graham is a 79 y.o. male presenting on 05/04/2020 for Dysuria (C/o pain when urinating during recent hospital stay.  Sxs seems to be improving.)   Recent hospitalization for pneumothorax in setting of severe COPD. Discharged 05/03/2020. Breathing seems stable at this time. Denies chest pain. ongoing exertional dyspnea. Remains fatigued with deconditioning.   He had condom catheter placed - states this was attached to a vacuum to the wall. Towards end of hospital stay he was having discomfort with urination. Has been using depends. Urinary urgency and frequency seem to be improving. Has ongoing dysuria. No hematuria. No fevers/chills, nausea/vomiting, abd pain, flank pain.   Denies rash to penis.      Relevant past medical, surgical, family and social history reviewed and updated as indicated. Interim medical history since our last visit reviewed. Allergies and medications reviewed and updated. Outpatient Medications Prior to Visit  Medication Sig Dispense Refill  . albuterol (PROAIR HFA) 108  (90 Base) MCG/ACT inhaler Inhale 2 puffs into the lungs every 6 (six) hours as needed for wheezing or shortness of breath. 18 g 3  . ASPIRIN 81 PO Take 1 tablet by mouth daily.    . Cholecalciferol (VITAMIN D-3) 125 MCG (5000 UT) TABS Take 5,000 Units by mouth daily.    Marland Kitchen gabapentin (NEURONTIN) 300 MG capsule Take 300 mg by mouth 3 (three) times daily. (Take with 600 mg for total of 900 mg three times daily)    . gabapentin (NEURONTIN) 600 MG tablet Take 600 mg by mouth 3 (three) times daily. (Take with 300 mg for total of 900 mg three times daily)    . HYDROcodone-acetaminophen (NORCO) 10-325 MG tablet Take 1 tablet by mouth every 6 (six) hours as needed for moderate pain or severe pain. 60 tablet 0  . lisinopril (ZESTRIL) 10 MG tablet TAKE 1 TABLET BY MOUTH EVERY EVENING (Patient taking differently: Take 10 mg by mouth every evening. ) 90 tablet 3  . metoprolol tartrate (LOPRESSOR) 25 MG tablet TAKE 1/2 TABLET BY MOUTH TWICE DAILY (Patient taking differently: Take 12.5 mg by mouth 2 (two) times daily. ) 90 tablet 3  . Multiple Vitamin (MULTIVITAMIN WITH MINERALS) TABS tablet Take 1 tablet by mouth every evening.    . naloxone (NARCAN) nasal spray 4 mg/0.1 mL 1 spray into nostril upon signs of opioid overdose. Call 911. May repeat once if no response within 2-3 minutes. 1 each 0  . NON FORMULARY Take 500 mg by mouth daily. Hemp Oil 500 mg    . omeprazole (PRILOSEC) 20 MG capsule Take  20 mg by mouth every morning.     . OXYGEN Inhale 2 L into the lungs daily. Using 4 L    . oxymetazoline (AFRIN) 0.05 % nasal spray Place 1 spray into both nostrils 2 (two) times daily. As needed    . polyethylene glycol (MIRALAX / GLYCOLAX) packet Take 17 g by mouth daily.    . primidone (MYSOLINE) 50 MG tablet Take 150 mg by mouth 2 (two) times daily.    . rosuvastatin (CRESTOR) 10 MG tablet Take 1 tablet (10 mg total) by mouth daily. 90 tablet 1  . vitamin B-12 (CYANOCOBALAMIN) 100 MCG tablet Take 100 mcg by mouth  every evening.    . vitamin E 400 UNIT capsule Take 400 Units by mouth every evening.      No facility-administered medications prior to visit.     Per HPI unless specifically indicated in ROS section below Review of Systems Objective:  Ht 5\' 9"  (1.753 m)   Wt 180 lb (81.6 kg)   BMI 26.58 kg/m   Wt Readings from Last 3 Encounters:  05/04/20 180 lb (81.6 kg)  05/03/20 188 lb (85.3 kg)  04/16/20 185 lb (83.9 kg)       Physical exam: Gen: alert, NAD, not ill appearing Pulm: speaks in complete sentences without increased work of breathing Psych: normal mood, normal thought content      Results for orders placed or performed in visit on 05/04/20  POCT Urinalysis Dipstick (Automated)  Result Value Ref Range   Color, UA gold    Clarity, UA clear    Glucose, UA Negative Negative   Bilirubin, UA negative    Ketones, UA +/-    Spec Grav, UA 1.020 1.010 - 1.025   Blood, UA negative    pH, UA 6.0 5.0 - 8.0   Protein, UA Positive (A) Negative   Urobilinogen, UA 0.2 0.2 or 1.0 E.U./dL   Nitrite, UA negative    Leukocytes, UA Negative Negative   Assessment & Plan:   Problem List Items Addressed This Visit    Dysuria - Primary    UA today reassuring pointing against infection. Reassuringly symptoms are improving. Possibly just irritation from condom cath.  Rec avoid bladder irritants, increase water intake, let us know if persistent or worsening symptoms. Should continue to improve each day.      Relevant Orders   POCT Urinalysis Dipstick (Automated) (Completed)       No orders of the defined types were placed in this encounter.  Orders Placed This Encounter  Procedures  . POCT Urinalysis Dipstick (Automated)    I discussed the assessment and treatment plan with the patient. The patient was provided an opportunity to ask questions and all were answered. The patient agreed with the plan and demonstrated an understanding of the instructions. The patient was advised to call  back or seek an in-person evaluation if the symptoms worsen or if the condition fails to improve as anticipated.  Follow up plan: No follow-ups on file.  [name], MD

## 2020-05-04 NOTE — Telephone Encounter (Signed)
Left message on vm per dpr asking pt to call back and confirm he is available for a video visit at 4:30 today.  I will need to get him ready.  Also, he will need to drop off a urine sample in the foyer.

## 2020-05-04 NOTE — Telephone Encounter (Signed)
Spoke with pt/pt's wife on phn # listed for pt contact.  Notified pt that Dr. Darnell Level really needs a urine sample before his video visit.  Says he will try to get one and have wife drop it off before 5:00.  Also, pt will call back so I can get him ready for video visit.

## 2020-05-04 NOTE — Telephone Encounter (Signed)
Transition Care Management Unsuccessful Follow-up Telephone Call  Date of discharge and from where:  05/03/2020 [facility]  Attempts:  1st Attempt  Reason for unsuccessful TCM follow-up call:  Left voice message

## 2020-05-04 NOTE — Telephone Encounter (Signed)
Please schedule virtual for this afternoon at 4:30pm, may see early if able - if pt able to get [place] urine sample to run.

## 2020-05-04 NOTE — Telephone Encounter (Signed)
Lvm asking Langley Gauss to call back.  Need to inform her Dr. Darnell Level is giving verbal orders for the services she requested.

## 2020-05-04 NOTE — Telephone Encounter (Signed)
Agree with this. Thanks.  

## 2020-05-04 NOTE — Telephone Encounter (Signed)
Denise with [facility] the Palliative Care Division left v/m that pt was D/C from [facility] and [facility] requested palliative care to go along with pts [facility] services. Denise request cb with verbal order.

## 2020-05-09 ENCOUNTER — Other Ambulatory Visit: Payer: Self-pay

## 2020-05-09 ENCOUNTER — Ambulatory Visit: Payer: Medicare Other

## 2020-05-09 DIAGNOSIS — S270XXD Traumatic pneumothorax, subsequent encounter: Secondary | ICD-10-CM

## 2020-05-12 ENCOUNTER — Ambulatory Visit
Admission: RE | Admit: 2020-05-12 | Discharge: 2020-05-12 | Disposition: A | Discharge status: Home / Self Care | Payer: Medicare Other | Source: Ambulatory Visit | Attending: Radiation Oncology | Admitting: Radiation Oncology

## 2020-05-12 DIAGNOSIS — C7801 Secondary malignant neoplasm of right lung: Secondary | ICD-10-CM | POA: Insufficient documentation

## 2020-05-12 DIAGNOSIS — C3411 Malignant neoplasm of upper lobe, right bronchus or lung: Secondary | ICD-10-CM | POA: Insufficient documentation

## 2020-05-12 DIAGNOSIS — Z87891 Personal history of nicotine dependence: Secondary | ICD-10-CM | POA: Insufficient documentation

## 2020-05-12 DIAGNOSIS — Z510 Encounter for antineoplastic radiation therapy: Secondary | ICD-10-CM | POA: Insufficient documentation

## 2020-05-13 ENCOUNTER — Inpatient Hospital Stay: Payer: Medicare Other | Attending: Hospice and Palliative Medicine | Admitting: Hospice and Palliative Medicine

## 2020-05-13 DIAGNOSIS — Z515 Encounter for palliative care: Secondary | ICD-10-CM | POA: Diagnosis not present

## 2020-05-13 DIAGNOSIS — C3411 Malignant neoplasm of upper lobe, right bronchus or lung: Secondary | ICD-10-CM

## 2020-05-13 DIAGNOSIS — G893 Neoplasm related pain (acute) (chronic): Secondary | ICD-10-CM | POA: Diagnosis not present

## 2020-05-13 MED ORDER — HYDROCODONE-ACETAMINOPHEN 7.5-325 MG PO TABS: 1.0000 | ORAL_TABLET | Freq: Four times a day (QID) | ORAL | 0 refills | Status: DC | PRN

## 2020-05-13 NOTE — Progress Notes (Signed)
Virtual Visit via Telephone Note  I connected with Joseph Graham on 05/13/20 at 10:30 AM EST by telephone and verified that I am speaking with the correct person using two identifiers.  Location: Patient: home Provider: clinic   I discussed the limitations, risks, security and privacy concerns of performing an evaluation and management service by telephone and the availability of in person appointments. I also discussed with the patient that there may be a patient responsible charge related to this service. The patient expressed understanding and agreed to proceed.   History of Present Illness: [name] is a 79 year old man with multiple medical problems including O2 dependent COPD, CAD, PAD, peripheral neuropathy, and lumbar stenosis with chronic back pain.  Additionally, patient has stage II lung cancer with CT of the chest on 02/24/2020 revealing interval growth and pulmonary nodules.    Patient is currently receiving XRT.  However, he has had difficulty tolerating XRT treatments due to persistent back pain. He is referred to palliative care to address goals and manage ongoing symptoms.   Observations/Objective: Patient has 2 recent hospitalizations for spontaneous PTX.  Since most recent discharge home from the hospital, he reports that he is feeling much improved with his breathing but that he remains weak.  He denies any significant changes or concerns today.  He continues to endorse severe back pain that makes XRT treatments difficult.  He tried taking 1 tablet of the Norco 10-325 mg dose but found that it caused him to feel loopy.  He has been splitting the tablets in half but finds that it is not quite as effective for controlling his pain.  Will trial Norco 7.5 mg tablets.  Patient is not currently being followed by pulmonology and we discussed referral.  Patient wanted to hold off for now and wants to self refer for both him and wife when they have availability.  Patient does  have ACP documents in chart.  Discussed his wishes for intubation at some point in the future if he were to need it and he was unsure.  I encouraged him to speak with family regarding his wishes.   Assessment and Plan: Stage II lung cancer -on XRT  Neoplasm related pain -exacerbated by chronic back pain.  Norco 7.5-325mg  every 6 hours as needed #60  Case and plan discussed with Dr. Rogue Bussing  Follow Up Instructions: Follow-up MyChart visit with me in 2 to 3 weeks   I discussed the assessment and treatment plan with the patient. The patient was provided an opportunity to ask questions and all were answered. The patient agreed with the plan and demonstrated an understanding of the instructions.   The patient was advised to call back or seek an in-person evaluation if the symptoms worsen or if the condition fails to improve as anticipated.  I provided 20 minutes of non-face-to-face time during this encounter.   Irean Hong, NP

## 2020-05-14 ENCOUNTER — Telehealth: Payer: Self-pay | Admitting: Internal Medicine

## 2020-05-14 NOTE — Telephone Encounter (Signed)
On 12/02-discussed with Dr. Regenia Skeeter regarding moving out  Appointments with [place] as per patient preference.  I think is reasonable given patient's ongoing radiation.  C-please cancel the patient appointment with [place] in December; reschedule a video appointment in late January 2022. Thanks GB

## 2020-05-16 ENCOUNTER — Encounter: Payer: Self-pay | Admitting: Internal Medicine

## 2020-05-16 ENCOUNTER — Telehealth: Payer: Self-pay | Admitting: Adult Health Nurse Practitioner

## 2020-05-16 ENCOUNTER — Ambulatory Visit: Payer: Medicare Other

## 2020-05-16 NOTE — Telephone Encounter (Signed)
Spoke with patient's wife regarding the Palliative referral and services and all questions were answered.  Wife stated that patient has improved greatly since being discharged from hospital and have declined services at this time.  I instructed them to contact his PCP if they changed their minds and needed our services in the future and she was in agreement with this.

## 2020-05-17 ENCOUNTER — Inpatient Hospital Stay: Payer: Medicare Other | Admitting: Internal Medicine

## 2020-05-17 ENCOUNTER — Telehealth: Payer: Medicare Other | Admitting: Internal Medicine

## 2020-05-18 ENCOUNTER — Ambulatory Visit: Payer: Medicare Other

## 2020-05-18 ENCOUNTER — Ambulatory Visit
Admission: RE | Admit: 2020-05-18 | Discharge: 2020-05-18 | Disposition: A | Discharge status: Home / Self Care | Payer: Medicare Other | Source: Ambulatory Visit | Attending: Radiation Oncology | Admitting: Radiation Oncology

## 2020-05-18 DIAGNOSIS — C7801 Secondary malignant neoplasm of right lung: Secondary | ICD-10-CM | POA: Diagnosis not present

## 2020-05-18 DIAGNOSIS — C3411 Malignant neoplasm of upper lobe, right bronchus or lung: Secondary | ICD-10-CM | POA: Diagnosis not present

## 2020-05-18 DIAGNOSIS — Z87891 Personal history of nicotine dependence: Secondary | ICD-10-CM | POA: Diagnosis not present

## 2020-05-18 DIAGNOSIS — Z510 Encounter for antineoplastic radiation therapy: Secondary | ICD-10-CM | POA: Diagnosis not present

## 2020-05-19 ENCOUNTER — Encounter: Payer: Self-pay | Admitting: Family Medicine

## 2020-05-20 ENCOUNTER — Ambulatory Visit (INDEPENDENT_AMBULATORY_CARE_PROVIDER_SITE_OTHER): Payer: Medicare Other | Admitting: Physician Assistant

## 2020-05-20 ENCOUNTER — Encounter: Payer: Self-pay | Admitting: Physician Assistant

## 2020-05-20 ENCOUNTER — Other Ambulatory Visit: Payer: Self-pay

## 2020-05-20 ENCOUNTER — Ambulatory Visit
Admission: RE | Admit: 2020-05-20 | Discharge: 2020-05-20 | Disposition: A | Discharge status: Home / Self Care | Payer: Medicare Other | Source: Ambulatory Visit | Attending: Radiation Oncology | Admitting: Radiation Oncology

## 2020-05-20 ENCOUNTER — Encounter: Payer: Medicare Other | Admitting: Cardiothoracic Surgery

## 2020-05-20 VITALS — BP 146/68 | HR 81 | Ht 69.0 in

## 2020-05-20 DIAGNOSIS — C3411 Malignant neoplasm of upper lobe, right bronchus or lung: Secondary | ICD-10-CM | POA: Diagnosis not present

## 2020-05-20 DIAGNOSIS — Z87891 Personal history of nicotine dependence: Secondary | ICD-10-CM | POA: Diagnosis not present

## 2020-05-20 DIAGNOSIS — S270XXD Traumatic pneumothorax, subsequent encounter: Secondary | ICD-10-CM

## 2020-05-20 DIAGNOSIS — Z09 Encounter for follow-up examination after completed treatment for conditions other than malignant neoplasm: Secondary | ICD-10-CM

## 2020-05-20 DIAGNOSIS — C7801 Secondary malignant neoplasm of right lung: Secondary | ICD-10-CM | POA: Diagnosis not present

## 2020-05-20 DIAGNOSIS — Z510 Encounter for antineoplastic radiation therapy: Secondary | ICD-10-CM | POA: Diagnosis not present

## 2020-05-20 NOTE — Patient Instructions (Signed)
   Follow-up with our office as needed.  Please call and ask to speak with a nurse if you develop questions or concerns.  

## 2020-05-20 NOTE — Progress Notes (Signed)
[facility] POST-OP OFFICE VISIT  05/20/2020  HPI: Joseph Graham is a 79 y.o. male 23 days s/p right thoracoscopy with talc pleurodesis for recurrent right pneumothorax with Dr Joseph Graham  He is doing well Main issue is irritation from the sutures His SOB and O2 requirements are baseline No chest pain He did not have his CXR prior to this appointment.   Vital signs: BP (!) 146/68   Pulse 81   Ht 5\' 9"  (1.753 m)   SpO2 (!) 86%   BMI 26.58 kg/m    Physical Exam: Constitutional: Well appearing male, NAD Pulmonary: distant lung sounds, equal, CTAB, on 3L [place] which is baseline for him Skin: Chest tube site and thoracotomy incision are CDI with sutures (I removed these)  Assessment/Plan: This is a 79 y.o. male 23 days s/p right thoracoscopy with talc pleurodesis for recurrent right pneumothorax with Dr Joseph Graham   - Sutures removed  - Continue supplemental O2 as needed  - rtc on as needed basis   -- Edison Simon, PA-C [facility] 05/20/2020, 10:05 AM [phone] M-F: 7am - 4pm

## 2020-05-23 ENCOUNTER — Ambulatory Visit
Admission: RE | Admit: 2020-05-23 | Discharge: 2020-05-23 | Disposition: A | Discharge status: Home / Self Care | Payer: Medicare Other | Source: Ambulatory Visit | Attending: Radiation Oncology | Admitting: Radiation Oncology

## 2020-05-23 ENCOUNTER — Ambulatory Visit: Payer: Medicare Other

## 2020-05-23 DIAGNOSIS — Z87891 Personal history of nicotine dependence: Secondary | ICD-10-CM | POA: Diagnosis not present

## 2020-05-23 DIAGNOSIS — C7801 Secondary malignant neoplasm of right lung: Secondary | ICD-10-CM | POA: Diagnosis not present

## 2020-05-23 DIAGNOSIS — Z510 Encounter for antineoplastic radiation therapy: Secondary | ICD-10-CM | POA: Diagnosis not present

## 2020-05-23 DIAGNOSIS — C3411 Malignant neoplasm of upper lobe, right bronchus or lung: Secondary | ICD-10-CM | POA: Diagnosis not present

## 2020-05-25 ENCOUNTER — Ambulatory Visit: Payer: Medicare Other

## 2020-05-26 ENCOUNTER — Ambulatory Visit: Payer: Medicare Other | Attending: Internal Medicine

## 2020-05-26 DIAGNOSIS — Z23 Encounter for immunization: Secondary | ICD-10-CM

## 2020-05-26 NOTE — Progress Notes (Signed)
° °  Covid-19 Vaccination Clinic  Name:  SERAFIN DECATUR    MRN: [id number] DOB: Apr 08, 1941  05/26/2020  Mr. Fuhs was observed post Covid-19 immunization for 15 minutes without incident. He was provided with Vaccine Information Sheet and instruction to access the V-Safe system.   Mr. Klayman was instructed to call 911 with any severe reactions post vaccine:  Difficulty breathing   Swelling of face and throat   A fast heartbeat   A bad rash all over body   Dizziness and weakness   Immunizations Administered    Name Date Dose VIS Date Route   Moderna COVID-19 Vaccine 05/26/2020  3:28 PM 0.5 mL 03/30/2020 Intramuscular   Manufacturer: Moderna   Lot: [id number]   [place]: [id number]

## 2020-05-27 ENCOUNTER — Telehealth: Payer: Medicare Other | Admitting: Internal Medicine

## 2020-05-27 ENCOUNTER — Encounter: Payer: Medicare Other | Admitting: Hospice and Palliative Medicine

## 2020-05-30 ENCOUNTER — Other Ambulatory Visit: Payer: Self-pay | Admitting: *Deleted

## 2020-05-30 MED ORDER — HYDROCODONE-ACETAMINOPHEN 7.5-325 MG PO TABS: 1.0000 | ORAL_TABLET | Freq: Four times a day (QID) | ORAL | 0 refills | Status: DC | PRN

## 2020-05-31 ENCOUNTER — Telehealth: Payer: Medicare Other | Admitting: Internal Medicine

## 2020-06-02 DIAGNOSIS — R0600 Dyspnea, unspecified: Secondary | ICD-10-CM | POA: Diagnosis not present

## 2020-06-02 DIAGNOSIS — J9621 Acute and chronic respiratory failure with hypoxia: Secondary | ICD-10-CM | POA: Diagnosis not present

## 2020-06-02 DIAGNOSIS — J95811 Postprocedural pneumothorax: Secondary | ICD-10-CM | POA: Diagnosis not present

## 2020-06-02 DIAGNOSIS — R0602 Shortness of breath: Secondary | ICD-10-CM | POA: Diagnosis not present

## 2020-06-02 DIAGNOSIS — J9611 Chronic respiratory failure with hypoxia: Secondary | ICD-10-CM | POA: Diagnosis not present

## 2020-06-06 ENCOUNTER — Telehealth: Payer: Self-pay | Admitting: *Deleted

## 2020-06-06 MED ORDER — HYDROCODONE-ACETAMINOPHEN 7.5-325 MG PO TABS: 1.0000 | ORAL_TABLET | ORAL | 0 refills | Status: DC | PRN

## 2020-06-06 NOTE — Telephone Encounter (Signed)
I spoke with patient by phone.  He reports that he injured his back during the final XRT treatment and has been having worse lower back pain.  He says the Norco is helping his pain but he is taking it every 6 hours around-the-clock.  Historically, he has been reluctant to increase the dose, frequency, or start a long-acting opioid due to concerns of addiction and loopiness.  He tells me that his primary goal now is just to be more comfortable.  He verbalized that he has many friends who are dying and that the patient feels like what ever time he has left, he wants it to be as comfortable as possible.  Will increase the frequency of Norco 7.5 mg to every 4 hours as needed.  Consider starting him back on an LAO if around-the-clock dosing is required.  Follow-up visit as scheduled next month or sooner if needed.

## 2020-06-06 NOTE — Telephone Encounter (Signed)
Patient called and left message requesting to speak with Sharion Dove, NP

## 2020-06-16 ENCOUNTER — Encounter: Payer: Self-pay | Admitting: Radiation Oncology

## 2020-06-27 ENCOUNTER — Encounter: Payer: Self-pay | Admitting: Radiation Oncology

## 2020-06-27 ENCOUNTER — Ambulatory Visit: Payer: Medicare Other | Admitting: Radiation Oncology

## 2020-07-03 DIAGNOSIS — J95811 Postprocedural pneumothorax: Secondary | ICD-10-CM | POA: Diagnosis not present

## 2020-07-03 DIAGNOSIS — R0600 Dyspnea, unspecified: Secondary | ICD-10-CM | POA: Diagnosis not present

## 2020-07-03 DIAGNOSIS — J9611 Chronic respiratory failure with hypoxia: Secondary | ICD-10-CM | POA: Diagnosis not present

## 2020-07-03 DIAGNOSIS — J9621 Acute and chronic respiratory failure with hypoxia: Secondary | ICD-10-CM | POA: Diagnosis not present

## 2020-07-03 DIAGNOSIS — R0602 Shortness of breath: Secondary | ICD-10-CM | POA: Diagnosis not present

## 2020-07-04 ENCOUNTER — Encounter: Payer: Self-pay | Admitting: Radiation Oncology

## 2020-07-04 ENCOUNTER — Ambulatory Visit
Admission: RE | Admit: 2020-07-04 | Discharge: 2020-07-04 | Disposition: A | Discharge status: Home / Self Care | Payer: Medicare Other | Source: Ambulatory Visit | Attending: Radiation Oncology | Admitting: Radiation Oncology

## 2020-07-04 VITALS — BP 156/64 | HR 79 | Temp 97.9°F | Wt 180.0 lb

## 2020-07-04 DIAGNOSIS — C3411 Malignant neoplasm of upper lobe, right bronchus or lung: Secondary | ICD-10-CM

## 2020-07-04 NOTE — Progress Notes (Signed)
Radiation Oncology Follow up Note  Name: Joseph Graham   Date:   07/04/2020 MRN:  [id number] DOB: [birth year]    This 80 y.o. male presents to the clinic today for 1 month out from SBRT to progressive right lower lobe lesion and patient previously treated to his right upper lobe back in 2019 for poorly differentiated non-small cell lung cancer with 2 separate foci.  REFERRING PROVIDER: [name], MD  HPI: Patient is an 80 year old male now at 1 month having completed retreatment with SBRT for progressive lesion in his right lower lobe.  He been treated back in 2019 to his right upper lobe for poorly differentiated non-small cell lung cancer he states this time he has had a little more burning in his chest.  He states his oxygen level has increased somewhat as far as his nasal oxygen is concerned.  He specifically [place] cough hemoptysis or chest tightness..  COMPLICATIONS OF TREATMENT: none  FOLLOW UP COMPLIANCE: keeps appointments   PHYSICAL EXAM:  BP (!) 156/64   Pulse 79   Temp 97.9 F (36.6 C) (Tympanic)   Wt 180 lb (81.6 kg)   SpO2 95% Comment: o2 3 liters  BMI 26.58 kg/m  Wheelchair-bound male on nasal oxygen.  Well-developed well-nourished patient in NAD. HEENT reveals PERLA, EOMI, discs not visualized.  Oral cavity is clear. No oral mucosal lesions are identified. Neck is clear without evidence of cervical or supraclavicular adenopathy. Lungs are clear to A&P. Cardiac examination is essentially unremarkable with regular rate and rhythm without murmur rub or thrill. Abdomen is benign with no organomegaly or masses noted. Motor sensory and DTR levels are equal and symmetric in the upper and lower extremities. Cranial nerves II through XII are grossly intact. Proprioception is intact. No peripheral adenopathy or edema is identified. No motor or sensory levels are noted. Crude visual fields are within normal range.  RADIOLOGY RESULTS: No current films for review  PLAN: Present  time I have asked to see the patient back in 3 months with a CT scan of his chest prior to that visit.  I have assured him his symptoms will clear over the next several weeks.  He probably is having some intensive pneumonitis in the area of SBRT.  Patient comprehends my recommendations well.  I would like to take this opportunity to thank you for allowing me to participate in the care of your patient.Noreene Filbert, MD

## 2020-07-05 ENCOUNTER — Encounter: Payer: Self-pay | Admitting: Family Medicine

## 2020-07-06 ENCOUNTER — Inpatient Hospital Stay: Payer: Medicare Other | Attending: Internal Medicine | Admitting: Internal Medicine

## 2020-07-06 ENCOUNTER — Inpatient Hospital Stay (HOSPITAL_BASED_OUTPATIENT_CLINIC_OR_DEPARTMENT_OTHER): Payer: Medicare Other | Admitting: Hospice and Palliative Medicine

## 2020-07-06 ENCOUNTER — Encounter: Payer: Self-pay | Admitting: Internal Medicine

## 2020-07-06 DIAGNOSIS — Z515 Encounter for palliative care: Secondary | ICD-10-CM | POA: Diagnosis not present

## 2020-07-06 DIAGNOSIS — C3411 Malignant neoplasm of upper lobe, right bronchus or lung: Secondary | ICD-10-CM

## 2020-07-06 DIAGNOSIS — G893 Neoplasm related pain (acute) (chronic): Secondary | ICD-10-CM

## 2020-07-06 MED ORDER — LIDOCAINE 5 % EX PTCH: 1.0000 | MEDICATED_PATCH | CUTANEOUS | 2 refills | Status: DC

## 2020-07-06 MED ORDER — HYDROCODONE-ACETAMINOPHEN 10-325 MG PO TABS: 1.0000 | ORAL_TABLET | Freq: Four times a day (QID) | ORAL | 0 refills | Status: DC | PRN

## 2020-07-06 NOTE — Progress Notes (Signed)
I connected with Joseph Graham on 07/06/20 at  3:30 PM EST by video enabled telemedicine visit and verified that I am speaking with the correct person using two identifiers.  I discussed the limitations, risks, security and privacy concerns of performing an evaluation and management service by telemedicine and the availability of in-person appointments. I also discussed with the patient that there may be a patient responsible charge related to this service. The patient expressed understanding and agreed to proceed.    Other persons participating in the visit and their role in the encounter: RN/medical reconciliation Patient's location: home Provider's location: office  Oncology History Overview Note   # OCT 2016- RUL POORLY DIFF CA [clinically non-small cell] ; STAGE IIB [T3-2 separate nodules in RUL; N0] [s/p CT guided bx]- s/p RT [finished mid jan 2017]; FEB 2017-CT scan- regression of RUL nodules;   # March 2017- Start carbo-taxol q 3 w x4; June 2017- CT- Improvement of RUL nodule/ Stable RUL nodules. OCT 2017- PR   # OCT 2016- 2 sub cm nodules in Right LL- Feb CT 2017- STABLE; June 2017- resolved  # May- 2017- G2 PN  # OCT 2016-NON-invasive bladder urothelial cancer; high grade [Dr. [name]; s/p TURBT [Feb 2017- NEG]; declined BCG  #   # Pneumothorax [oct 2016 s/p CT Bx];  # MOLECULAR STUDIES- PDL-1 by IHC/keytruda- 11-20%.  -----------------------------------------    DIAGNOSIS: RUL Lung cancer  STAGE:   II ; GOALS: control  CURRENT/MOST RECENT THERAPY: Surveillance      Malignant neoplasm of right upper lobe of lung ([place])  12/20/2015 Initial Diagnosis   Malignant neoplasm of right upper lobe of lung ([place])      Chief Complaint: Lung cancer   History of present illness:Joseph Graham 80 y.o.  male with history of history of advanced COPD on home O2 recurrent pneumothorax; stage II lung cancer; and currently recurrent stage I right lower lobe lung cancer is here for  follow-up.  Patient's SBRT to the right lower lobe lesion was interrupted-because of at least 2 admissions to hospital for spontaneous pneumothorax.  Patient finally underwent pleurodesis.  Patient finally was able to finish his radiation end of December 2021.  His breathing seems to be okay.  He continues been oxygen.   Observation/objective: No acute distress.  Talking coherently.  Assessment and plan: Malignant neoplasm of right upper lobe of lung ([place]) # Right UL POORLY DIFF CA- T3 N0  Currently status post radiation [finished feb 2017];  Finished adjuvant chemotherapy [end of April 2017].  CT scan SEP 15th:  # RLL-1.5cm-peripheral continues to grow over the last few years.- s/p SBRT [finished Dec 15th, 2021]- awaiting CT chest in April 2022.  #Spontaneous pneumothorax x2 status post pleurodesis-currently clinically stable.   # Peripheral neuropathy/back pain-STABLE; on hydrocodone/oxycodone- defer to [facility] for pain management. Also continue Neurontin.   # COPD on O2/nasal cannula-STABLE  # Bladder cancer- superficial-STABLE.   # DISPOSITION:  # Follow up in end of April 2022- virtual-MD/[name]- Dr.B   Follow-up instructions:  I discussed the assessment and treatment plan with the patient.  The patient was provided an opportunity to ask questions and all were answered.  The patient agreed with the plan and demonstrated understanding of instructions.  The patient was advised to call back or seek an in person evaluation if the symptoms worsen or if the condition fails to improve as anticipated.  Dr. Charlaine Dalton Casey at [facility] 07/06/2020 3:29 PM

## 2020-07-06 NOTE — Progress Notes (Signed)
Virtual Visit via Telephone Note  I connected with Joseph Graham on 07/06/20 at  3:00 PM EST by telephone and verified that I am speaking with the correct person using two identifiers.  Location: Patient: home Provider: clinic   I discussed the limitations, risks, security and privacy concerns of performing an evaluation and management service by telephone and the availability of in person appointments. I also discussed with the patient that there may be a patient responsible charge related to this service. The patient expressed understanding and agreed to proceed.   History of Present Illness: [name] is a 80 year old man with multiple medical problems including O2 dependent COPD, CAD, PAD, peripheral neuropathy, and lumbar stenosis with chronic back pain.  Additionally, patient has stage II lung cancer with CT of the chest on 02/24/2020 revealing interval growth and pulmonary nodules.    Patient is currently receiving XRT.  However, he has had difficulty tolerating XRT treatments due to persistent back pain. He is referred to palliative care to address goals and manage ongoing symptoms.   Observations/Objective: Patient reports that he was taking the Norco every 4 hours and felt like it was causing him to be too drowsy.  He is now taking 4 times a day and feels like that is better controlling his pain but still finds periods where the pain is intolerable.  Patient is interested in retrying the 10 mg Norco but maybe 3 times a day instead of 4.  We again discussed the option of a long-acting opioid but patient is concerned about possible side effects.  We will also trial Lidoderm patch.   Previously discussed ACP with patient.  He was considering options.   Assessment and Plan: Stage II lung cancer -s/p XRT.  Now on surveillance  Neoplasm related pain - Increase Norco 10-325mg  Q6H PRN  #90. Lidoderm 5% patches to back #30  Case and plan discussed with Dr. Rogue Bussing  Follow  Up Instructions: Follow-up MyChart visit with me in 1-2 months   I discussed the assessment and treatment plan with the patient. The patient was provided an opportunity to ask questions and all were answered. The patient agreed with the plan and demonstrated an understanding of the instructions.   The patient was advised to call back or seek an in-person evaluation if the symptoms worsen or if the condition fails to improve as anticipated.  I provided 25 minutes of non-face-to-face time during this encounter.   Irean Hong, NP

## 2020-07-06 NOTE — Assessment & Plan Note (Addendum)
#   Right UL POORLY DIFF CA- T3 N0  Currently status post radiation [finished feb 2017];  Finished adjuvant chemotherapy [end of April 2017].  CT scan SEP 15th:  # RLL-1.5cm-peripheral continues to grow over the last few years.- s/p SBRT [finished Dec 15th, 2021]- awaiting CT chest in April 2022.  #Spontaneous pneumothorax x2 status post pleurodesis-currently clinically stable.   # Peripheral neuropathy/back pain-STABLE; on hydrocodone/oxycodone- defer to [facility] for pain management. Also continue Neurontin.   # COPD on O2/nasal cannula-STABLE  # Bladder cancer- superficial-STABLE.   # DISPOSITION:  # Follow up in end of April 2022- virtual-MD/[name]- Dr.B

## 2020-07-07 ENCOUNTER — Telehealth: Payer: Self-pay

## 2020-07-07 NOTE — Telephone Encounter (Addendum)
PA approved sent patient a message informing him.

## 2020-07-07 NOTE — Telephone Encounter (Signed)
PA initiated for lidocaine patch 5%  [id number]

## 2020-07-10 ENCOUNTER — Other Ambulatory Visit: Payer: Self-pay | Admitting: Family Medicine

## 2020-07-10 DIAGNOSIS — E559 Vitamin D deficiency, unspecified: Secondary | ICD-10-CM

## 2020-07-10 DIAGNOSIS — E782 Mixed hyperlipidemia: Secondary | ICD-10-CM

## 2020-07-10 DIAGNOSIS — E538 Deficiency of other specified B group vitamins: Secondary | ICD-10-CM

## 2020-07-10 DIAGNOSIS — R7303 Prediabetes: Secondary | ICD-10-CM

## 2020-07-10 DIAGNOSIS — N400 Enlarged prostate without lower urinary tract symptoms: Secondary | ICD-10-CM

## 2020-07-12 ENCOUNTER — Other Ambulatory Visit: Payer: Self-pay

## 2020-07-12 ENCOUNTER — Other Ambulatory Visit (INDEPENDENT_AMBULATORY_CARE_PROVIDER_SITE_OTHER): Payer: Medicare Other

## 2020-07-12 DIAGNOSIS — E538 Deficiency of other specified B group vitamins: Secondary | ICD-10-CM

## 2020-07-12 DIAGNOSIS — R7303 Prediabetes: Secondary | ICD-10-CM

## 2020-07-12 DIAGNOSIS — E559 Vitamin D deficiency, unspecified: Secondary | ICD-10-CM | POA: Diagnosis not present

## 2020-07-12 DIAGNOSIS — E782 Mixed hyperlipidemia: Secondary | ICD-10-CM

## 2020-07-12 LAB — LIPID PANEL
Cholesterol: 168 mg/dL (ref 0–200)
HDL: 49.6 mg/dL (ref >39.00)
LDL Cholesterol: 99 mg/dL (ref 0–99)
NonHDL: 118.79
Total CHOL/HDL Ratio: 3
Triglycerides: 99 mg/dL (ref 0.0–149.0)
VLDL: 19.8 mg/dL (ref 0.0–40.0)

## 2020-07-12 LAB — HEMOGLOBIN A1C: Hgb A1c MFr Bld: 6.6 % — ABNORMAL HIGH (ref 4.6–6.5)

## 2020-07-12 LAB — COMPREHENSIVE METABOLIC PANEL
ALT: 24 U/L (ref 0–53)
AST: 24 U/L (ref 0–37)
Albumin: 3.4 g/dL — ABNORMAL LOW (ref 3.5–5.2)
Alkaline Phosphatase: 107 U/L (ref 39–117)
BUN: 23 mg/dL (ref 6–23)
CO2: 39 mEq/L — ABNORMAL HIGH (ref 19–32)
Calcium: 9.4 mg/dL (ref 8.4–10.5)
Chloride: 94 mEq/L — ABNORMAL LOW (ref 96–112)
Creatinine, Ser: 0.51 mg/dL (ref 0.40–1.50)
GFR: 96.08 mL/min (ref >60.00)
Glucose, Bld: 114 mg/dL — ABNORMAL HIGH (ref 70–99)
Potassium: 4.3 mEq/L (ref 3.5–5.1)
Sodium: 138 mEq/L (ref 135–145)
Total Bilirubin: 0.3 mg/dL (ref 0.2–1.2)
Total Protein: 8.2 g/dL (ref 6.0–8.3)

## 2020-07-12 LAB — VITAMIN D 25 HYDROXY (VIT D DEFICIENCY, FRACTURES): VITD: 27.71 ng/mL — ABNORMAL LOW (ref 30.00–100.00)

## 2020-07-12 LAB — VITAMIN B12: Vitamin B-12: 754 pg/mL (ref 211–911)

## 2020-07-14 ENCOUNTER — Encounter: Payer: Self-pay | Admitting: Family Medicine

## 2020-07-18 ENCOUNTER — Encounter: Payer: Self-pay | Admitting: Family Medicine

## 2020-07-18 ENCOUNTER — Ambulatory Visit (INDEPENDENT_AMBULATORY_CARE_PROVIDER_SITE_OTHER): Payer: Medicare Other | Admitting: Family Medicine

## 2020-07-18 ENCOUNTER — Other Ambulatory Visit: Payer: Self-pay

## 2020-07-18 DIAGNOSIS — E782 Mixed hyperlipidemia: Secondary | ICD-10-CM | POA: Diagnosis not present

## 2020-07-18 DIAGNOSIS — J9611 Chronic respiratory failure with hypoxia: Secondary | ICD-10-CM

## 2020-07-18 DIAGNOSIS — G8929 Other chronic pain: Secondary | ICD-10-CM

## 2020-07-18 DIAGNOSIS — J310 Chronic rhinitis: Secondary | ICD-10-CM

## 2020-07-18 DIAGNOSIS — E559 Vitamin D deficiency, unspecified: Secondary | ICD-10-CM

## 2020-07-18 DIAGNOSIS — J95811 Postprocedural pneumothorax: Secondary | ICD-10-CM | POA: Diagnosis not present

## 2020-07-18 DIAGNOSIS — T485X5A Adverse effect of other anti-common-cold drugs, initial encounter: Secondary | ICD-10-CM

## 2020-07-18 DIAGNOSIS — I10 Essential (primary) hypertension: Secondary | ICD-10-CM

## 2020-07-18 DIAGNOSIS — J449 Chronic obstructive pulmonary disease, unspecified: Secondary | ICD-10-CM

## 2020-07-18 DIAGNOSIS — G250 Essential tremor: Secondary | ICD-10-CM

## 2020-07-18 DIAGNOSIS — M545 Low back pain, unspecified: Secondary | ICD-10-CM

## 2020-07-18 DIAGNOSIS — E118 Type 2 diabetes mellitus with unspecified complications: Secondary | ICD-10-CM | POA: Diagnosis not present

## 2020-07-18 MED ORDER — MONTELUKAST SODIUM 10 MG PO TABS: 10.0000 mg | ORAL_TABLET | Freq: Every day | ORAL | 3 refills | Status: DC

## 2020-07-18 MED ORDER — AZELASTINE HCL 0.1 % NA SOLN: 1.0000 | Freq: Two times a day (BID) | NASAL | 12 refills | Status: DC

## 2020-07-18 NOTE — Assessment & Plan Note (Signed)
States he's not taking crestor due to leg swelling.  Will remove from med list.  FLP overall stable

## 2020-07-18 NOTE — Assessment & Plan Note (Signed)
Has been unable to taper off frequent afrin use throughout the day.  Previously tried flonase and prednisone burst ineffective.  Will trial singulair + astelin nasal spray daily for 1 month.

## 2020-07-18 NOTE — Assessment & Plan Note (Signed)
Continue vit D 5000 IU

## 2020-07-18 NOTE — Assessment & Plan Note (Signed)
Has established with onc palliative care - now on norco 10/325. Notes sedation so he cuts in half. Also on gabapentin 900mg  TID. Hesitant to use lidocaine patches as he's concerned this caused leg weakness. Discussed spacing out dosing. Could consider cutting patches

## 2020-07-18 NOTE — Assessment & Plan Note (Signed)
A1c 6.6% - back in diabetes range.  Encouraged caution with added sugars and steroid course.

## 2020-07-18 NOTE — Patient Instructions (Addendum)
Continue current medicines.  Try astelin nasal spray and singulair nasal congestion pill in an effort to back off afrin use.  Return in 3 months for follow up visit - ok to do virtual.

## 2020-07-18 NOTE — Assessment & Plan Note (Signed)
Ongoing despite primidone and high dose gabapentin, sees neurology

## 2020-07-18 NOTE — Assessment & Plan Note (Signed)
Continue supplemental oxygen.  O2 sats down to 85% on 3L when he arrives, increases to 89% on 4L - suggested use 4L at home.

## 2020-07-18 NOTE — Assessment & Plan Note (Signed)
Appreciate thoracic surgery after care.

## 2020-07-18 NOTE — Assessment & Plan Note (Signed)
Chronic, stable on current regimen - continue. 

## 2020-07-18 NOTE — Progress Notes (Signed)
Patient ID: Joseph Graham, male    DOB: 1940/12/16, 80 y.o.   MRN: [id number]  This visit was conducted in person.  BP 132/62 (BP Location: Right Arm, Patient Position: Sitting, Cuff Size: Large)   Pulse 88   Temp 97.8 F (36.6 C) (Temporal)   SpO2 (!) 89% Comment: 3 L   CC: wellness visit - converted to 6 mo f/u visit  Subjective:   HPI: Joseph Graham is a 80 y.o. male presenting on 07/18/2020 for Medicare Wellness (Pt accompanied by wife, Mardene Celeste- temp 97.6.)   Did not see health advisor this year.    Hearing Screening   125Hz  250Hz  500Hz  1000Hz  2000Hz  3000Hz  4000Hz  6000Hz  8000Hz   Right ear:   40 0 20  40    Left ear:   40 0 20  40      Visual Acuity Screening   Right eye Left eye Both eyes  Without correction: 20/30 20/25 20/25   With correction:       Flowsheet Row Clinical Support from 07/10/2019 in [facility]  PHQ-2 Total Score 0      Fall Risk  07/18/2020 11/05/2019 08/04/2019 07/10/2019 04/30/2019  Falls in the past year? - 0 0 0 0  Number falls in past yr: 1 - 0 0 -  Injury with Fall? 0 - 0 0 -  Risk for fall due to : - - - Impaired balance/gait;Impaired mobility;Medication side effect -  Follow up - - - Falls evaluation completed;Falls prevention discussed -  2 falls this past month at home in computer room - had to call fire department to come help him get up. One after Moderna 3rd shot, another after losing balance reaching to cord on floor   Has established with palliative care ([name] Borders NP) for stage II lung cancer on XRT and cancer related pain on norco 10/325mg  Q6H #90/mo and lidoderm 5% patches. Also on gabapentin 900mg  tid. Found lidoderm patches highly effective but worried they caused leg weakness and inability to use knees so he's stopped them.  Has seen Dr Baruch Gouty and Rogue Bussing since my last OV.  Recent hospitalization x2 last year for PTX in setting of severe COPD.  Ambulates full time with walker at home. In wheelchair  today.  Poor diet, poor appetite. Discussed protein supplementation.   Afrin nasal spray overuse - multiple times a day. On afrin since 35 yrs ago.   Off crestor - states it caused ankle swelling.   Preventative: Colon cancer screening - age out due to comorbidities (Colonoscopy 09/2011 - polyps, rpt due 5 yrs, mild diverticulosis [name])- previous trial cancelled due to hypoxia.  Lung cancer screening -s/p lung cancertreatment - see above.  Prostate - mild BPH. Aged out. Flu -yearly - did not get this year, declines  COVID vaccine Moderna 06/2019, 07/2019, 05/2020  Td 07/2011.  Pneumonia - 2012. prevnar - 12/2013. shingrix - not interested  Advanced directives:previously reviewed Bowel - takes miralax daily for constipation - rec cut down to 1/2 capful daily  Bladder - intermittent urge incontinence   Caffeine: 1 cup decaf/week Lives with wife Occupation: retired, worked Press photographer Activity:limited by dyspnea and neuropathy Diet: healthy, good water, fruits/vegetables daily, red meat 1x/wk     Relevant past medical, surgical, family and social history reviewed and updated as indicated. Interim medical history since our last visit reviewed. Allergies and medications reviewed and updated. Outpatient Medications Prior to Visit  Medication Sig Dispense Refill  . albuterol (PROAIR  HFA) 108 (90 Base) MCG/ACT inhaler Inhale 2 puffs into the lungs every 6 (six) hours as needed for wheezing or shortness of breath. 18 g 3  . ASPIRIN 81 PO Take 1 tablet by mouth daily.    . Cholecalciferol (VITAMIN D-3) 125 MCG (5000 UT) TABS Take 5,000 Units by mouth daily.    Marland Kitchen gabapentin (NEURONTIN) 300 MG capsule Take 300 mg by mouth 3 (three) times daily. (Take with 600 mg for total of 900 mg three times daily)    . gabapentin (NEURONTIN) 600 MG tablet Take 600 mg by mouth 3 (three) times daily. (Take with 300 mg for total of 900 mg three times daily)    . HYDROcodone-acetaminophen (NORCO/VICODIN)  5-325 MG tablet Take 1 tablet by mouth every 6 (six) hours as needed for moderate pain.    Marland Kitchen lisinopril (ZESTRIL) 10 MG tablet TAKE 1 TABLET BY MOUTH EVERY EVENING (Patient taking differently: Take 10 mg by mouth every evening.) 90 tablet 3  . metoprolol tartrate (LOPRESSOR) 25 MG tablet TAKE 1/2 TABLET BY MOUTH TWICE DAILY (Patient taking differently: Take 12.5 mg by mouth 2 (two) times daily.) 90 tablet 3  . Multiple Vitamin (MULTIVITAMIN WITH MINERALS) TABS tablet Take 1 tablet by mouth every evening.    . NON FORMULARY Take 500 mg by mouth daily. Hemp Oil 500 mg    . omeprazole (PRILOSEC) 20 MG capsule Take 20 mg by mouth every morning.     . OXYGEN Inhale 2 L into the lungs daily. Using 4 L    . oxymetazoline (AFRIN) 0.05 % nasal spray Place 1 spray into both nostrils 2 (two) times daily. As needed    . polyethylene glycol (MIRALAX / GLYCOLAX) packet Take 17 g by mouth daily.    . primidone (MYSOLINE) 50 MG tablet Take 150 mg by mouth 2 (two) times daily.    . vitamin B-12 (CYANOCOBALAMIN) 100 MCG tablet Take 100 mcg by mouth every evening.    . vitamin E 400 UNIT capsule Take 400 Units by mouth every evening.     Marland Kitchen HYDROcodone-acetaminophen (NORCO) 10-325 MG tablet Take 1 tablet by mouth every 6 (six) hours as needed. 90 tablet 0  . lidocaine (LIDODERM) 5 % Place 1 patch onto the skin daily. Remove & Discard patch within 12 hours or as directed by MD 30 patch 2  . naloxone (NARCAN) nasal spray 4 mg/0.1 mL 1 spray into nostril upon signs of opioid overdose. Call 911. May repeat once if no response within 2-3 minutes. 1 each 0  . rosuvastatin (CRESTOR) 10 MG tablet Take 1 tablet (10 mg total) by mouth daily. (Patient not taking: No sig reported) 90 tablet 1   No facility-administered medications prior to visit.     Per HPI unless specifically indicated in ROS section below Review of Systems Objective:  BP 132/62 (BP Location: Right Arm, Patient Position: Sitting, Cuff Size: Large)   Pulse  88   Temp 97.8 F (36.6 C) (Temporal)   SpO2 (!) 89% Comment: 3 L  Wt Readings from Last 3 Encounters:  07/04/20 180 lb (81.6 kg)  05/04/20 180 lb (81.6 kg)  05/03/20 188 lb (85.3 kg)      Physical Exam Vitals and nursing note reviewed.  Constitutional:      Appearance: Normal appearance. He is obese.     Comments: Fatigued   Cardiovascular:     Rate and Rhythm: Normal rate and regular rhythm.     Pulses: Normal pulses.  Heart sounds: Normal heart sounds. No murmur heard.   Pulmonary:     Effort: Pulmonary effort is normal. No respiratory distress.     Breath sounds: No wheezing, rhonchi or rales.     Comments: Coarse Abdominal:     General: Bowel sounds are increased. There is distension.     Palpations: There is no mass.     Tenderness: There is no abdominal tenderness.  Musculoskeletal:     Right lower leg: Edema (1+) present.     Left lower leg: Edema (1+) present.  Neurological:     Mental Status: He is alert.  Psychiatric:        Mood and Affect: Mood normal.        Behavior: Behavior normal.       Results for orders placed or performed in visit on 07/12/20  Hemoglobin A1c  Result Value Ref Range   Hgb A1c MFr Bld 6.6 (H) 4.6 - 6.5 %  VITAMIN D 25 Hydroxy (Vit-D Deficiency, Fractures)  Result Value Ref Range   VITD 27.71 (L) 30.00 - 100.00 ng/mL  Vitamin B12  Result Value Ref Range   Vitamin B-12 754 211 - 911 pg/mL  Comprehensive metabolic panel  Result Value Ref Range   Sodium 138 135 - 145 mEq/L   Potassium 4.3 3.5 - 5.1 mEq/L   Chloride 94 (L) 96 - 112 mEq/L   CO2 39 (H) 19 - 32 mEq/L   Glucose, Bld 114 (H) 70 - 99 mg/dL   BUN 23 6 - 23 mg/dL   Creatinine, Ser 0.51 0.40 - 1.50 mg/dL   Total Bilirubin 0.3 0.2 - 1.2 mg/dL   Alkaline Phosphatase 107 39 - 117 U/L   AST 24 0 - 37 U/L   ALT 24 0 - 53 U/L   Total Protein 8.2 6.0 - 8.3 g/dL   Albumin 3.4 (L) 3.5 - 5.2 g/dL   GFR 96.08 >60.00 mL/min   Calcium 9.4 8.4 - 10.5 mg/dL  Lipid panel   Result Value Ref Range   Cholesterol 168 0 - 200 mg/dL   Triglycerides 99.0 0.0 - 149.0 mg/dL   HDL 49.60 >39.00 mg/dL   VLDL 19.8 0.0 - 40.0 mg/dL   LDL Cholesterol 99 0 - 99 mg/dL   Total CHOL/HDL Ratio 3    NonHDL 118.79    Assessment & Plan:  This visit occurred during the SARS-CoV-2 public health emergency.  Safety protocols were in place, including screening questions prior to the visit, additional usage of staff PPE, and extensive cleaning of exam room while observing appropriate contact time as indicated for disinfecting solutions.   Problem List Items Addressed This Visit    Vitamin D deficiency    Continue vit D 5000 IU       Rhinitis medicamentosa    Has been unable to taper off frequent afrin use throughout the day.  Previously tried flonase and prednisone burst ineffective.  Will trial singulair + astelin nasal spray daily for 1 month.       Recurrent pneumothorax after pleural drain removed    Appreciate thoracic surgery after care.       HTN (hypertension)    Chronic, stable on current regimen - continue.       HLD (hyperlipidemia)    States he's not taking crestor due to leg swelling.  Will remove from med list.  FLP overall stable       COPD, severe (HCC)    Severe COPD, oxygen dependent since 06/2017.  Didn't feel respiratory medications provided any benefit.  Continues PRN albuterol.      Relevant Medications   montelukast (SINGULAIR) 10 MG tablet   azelastine (ASTELIN) 0.1 % nasal spray   Controlled diabetes mellitus type 2 with complications (HCC)    Y8A 6.6% - back in diabetes range.  Encouraged caution with added sugars and steroid course.       Chronic respiratory failure with hypoxia (HCC)    Continue supplemental oxygen.  O2 sats down to 85% on 3L when he arrives, increases to 89% on 4L - suggested use 4L at home.       Chronic low back pain (Primary area of Pain) (Bilateral)  (L>R) w/o sciatica    Has established with onc palliative  care - now on norco 10/325. Notes sedation so he cuts in half. Also on gabapentin 900mg  TID. Hesitant to use lidocaine patches as he's concerned this caused leg weakness. Discussed spacing out dosing. Could consider cutting patches       Relevant Medications   HYDROcodone-acetaminophen (NORCO/VICODIN) 5-325 MG tablet   Benign essential tremor    Ongoing despite primidone and high dose gabapentin, sees neurology           Meds ordered this encounter  Medications  . montelukast (SINGULAIR) 10 MG tablet    Sig: Take 1 tablet (10 mg total) by mouth at bedtime.    Dispense:  30 tablet    Refill:  3  . azelastine (ASTELIN) 0.1 % nasal spray    Sig: Place 1 spray into both nostrils 2 (two) times daily. Use in each nostril as directed    Dispense:  30 mL    Refill:  12   No orders of the defined types were placed in this encounter.   Follow up plan: Return in about 3 months (around 10/15/2020) for follow up visit.  [name], MD

## 2020-07-18 NOTE — Assessment & Plan Note (Signed)
Severe COPD, oxygen dependent since 06/2017.  Didn't feel respiratory medications provided any benefit.  Continues PRN albuterol.

## 2020-07-22 DIAGNOSIS — H353211 Exudative age-related macular degeneration, right eye, with active choroidal neovascularization: Secondary | ICD-10-CM | POA: Diagnosis not present

## 2020-07-22 DIAGNOSIS — H353222 Exudative age-related macular degeneration, left eye, with inactive choroidal neovascularization: Secondary | ICD-10-CM | POA: Diagnosis not present

## 2020-07-26 ENCOUNTER — Telehealth: Payer: Self-pay

## 2020-07-26 NOTE — Telephone Encounter (Signed)
Received faxed form from [facility] to continue insurance coverage for O2 equipment.  Placed form in Dr. Synthia Innocent box.

## 2020-07-28 ENCOUNTER — Telehealth: Payer: Self-pay | Admitting: Family Medicine

## 2020-07-28 DIAGNOSIS — T451X5A Adverse effect of antineoplastic and immunosuppressive drugs, initial encounter: Secondary | ICD-10-CM | POA: Diagnosis not present

## 2020-07-28 DIAGNOSIS — G250 Essential tremor: Secondary | ICD-10-CM | POA: Diagnosis not present

## 2020-07-28 DIAGNOSIS — G620 Drug-induced polyneuropathy: Secondary | ICD-10-CM | POA: Diagnosis not present

## 2020-07-28 DIAGNOSIS — M6281 Muscle weakness (generalized): Secondary | ICD-10-CM | POA: Diagnosis not present

## 2020-07-28 NOTE — Telephone Encounter (Signed)
LVM  For pt to tn my call to schedule awv with nha.

## 2020-07-29 NOTE — Telephone Encounter (Signed)
Faxed form.

## 2020-07-29 NOTE — Telephone Encounter (Signed)
Filled and in Lisa's box 

## 2020-08-01 ENCOUNTER — Encounter: Payer: Self-pay | Admitting: Internal Medicine

## 2020-08-02 DIAGNOSIS — H524 Presbyopia: Secondary | ICD-10-CM | POA: Diagnosis not present

## 2020-08-03 ENCOUNTER — Encounter: Payer: Self-pay | Admitting: Hospice and Palliative Medicine

## 2020-08-03 ENCOUNTER — Inpatient Hospital Stay (HOSPITAL_BASED_OUTPATIENT_CLINIC_OR_DEPARTMENT_OTHER): Payer: Medicare Other | Admitting: Hospice and Palliative Medicine

## 2020-08-03 DIAGNOSIS — C3411 Malignant neoplasm of upper lobe, right bronchus or lung: Secondary | ICD-10-CM

## 2020-08-03 DIAGNOSIS — Z515 Encounter for palliative care: Secondary | ICD-10-CM

## 2020-08-03 DIAGNOSIS — J9621 Acute and chronic respiratory failure with hypoxia: Secondary | ICD-10-CM | POA: Diagnosis not present

## 2020-08-03 DIAGNOSIS — R0600 Dyspnea, unspecified: Secondary | ICD-10-CM | POA: Diagnosis not present

## 2020-08-03 DIAGNOSIS — R0602 Shortness of breath: Secondary | ICD-10-CM | POA: Diagnosis not present

## 2020-08-03 DIAGNOSIS — J9611 Chronic respiratory failure with hypoxia: Secondary | ICD-10-CM | POA: Diagnosis not present

## 2020-08-03 DIAGNOSIS — J95811 Postprocedural pneumothorax: Secondary | ICD-10-CM | POA: Diagnosis not present

## 2020-08-03 NOTE — Progress Notes (Signed)
Patient called/ pre- screened for palliative virtual appoinment today  Concerns of generalized pain, neuropathy and dyspnea on chronic oxygen

## 2020-08-03 NOTE — Progress Notes (Signed)
Virtual Visit via Telephone Note  I connected with Joseph Graham on 08/03/20 at  2:00 PM EST by telephone and verified that I am speaking with the correct person using two identifiers.  Location: Patient: home Provider: clinic   I discussed the limitations, risks, security and privacy concerns of performing an evaluation and management service by telephone and the availability of in person appointments. I also discussed with the patient that there may be a patient responsible charge related to this service. The patient expressed understanding and agreed to proceed.   History of Present Illness: [name] is a 80 year old man with multiple medical problems including O2 dependent COPD, CAD, PAD, peripheral neuropathy, and lumbar stenosis with chronic back pain.  Additionally, patient has stage II lung cancer with CT of the chest on 02/24/2020 revealing interval growth and pulmonary nodules.    Patient is currently receiving XRT.  However, he has had difficulty tolerating XRT treatments due to persistent back pain. He is referred to palliative care to address goals and manage ongoing symptoms.   Observations/Objective: Patient reports that pain is significantly improved with use of Lidoderm patch.  He found that it was too strong to use daily but will often the last a day or 2 before he has to reapply the patch.  He has found that he is better able to do activities with less pain.  He continues to take the Norco but at half a tablet 4 times a day.  Patient also says that he is weaning himself from the gabapentin at recommendation of his neurologist.  He has had some visual flashes, which she attributes to the gabapentin.  Patient has been seen by ophthalmology.   Assessment and Plan: Stage II lung cancer -s/p XRT.  Now on surveillance  Chronic low back pain -continue Norco 10-325mg  (half to 1 tablet) Q6H PRN.  Continue Lidoderm 5% patches to back   Follow Up Instructions: Follow-up  MyChart visit with me in 1-2 months   I discussed the assessment and treatment plan with the patient. The patient was provided an opportunity to ask questions and all were answered. The patient agreed with the plan and demonstrated an understanding of the instructions.   The patient was advised to call back or seek an in-person evaluation if the symptoms worsen or if the condition fails to improve as anticipated.  I provided 15 minutes of non-face-to-face time during this encounter.   Irean Hong, NP

## 2020-08-11 IMAGING — CR DG SACRUM/COCCYX 2+V
1 series · 4 of 4 positions shown · non-contrast
Comparison: None.

CLINICAL DATA: Chronic pain at coccyx and sacroiliac joints.

EXAM:
SACRUM AND COCCYX - 2+ VIEW

[Series 1: dg sacrum/coccyx · 0.14mm/px · 4 of 4 slices shown]
[im 1/4]
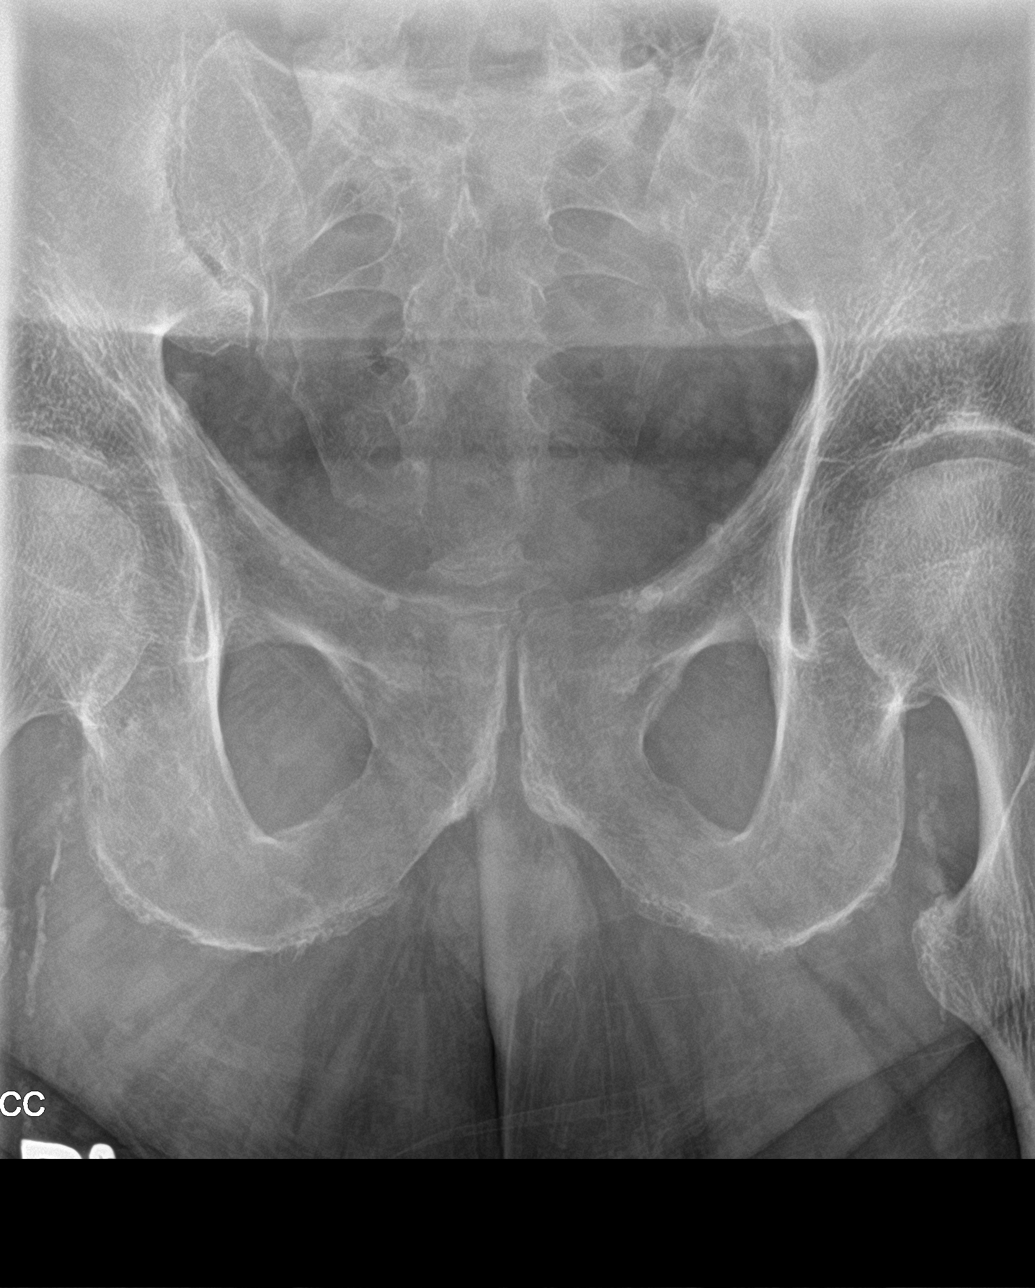
[im 2/4]
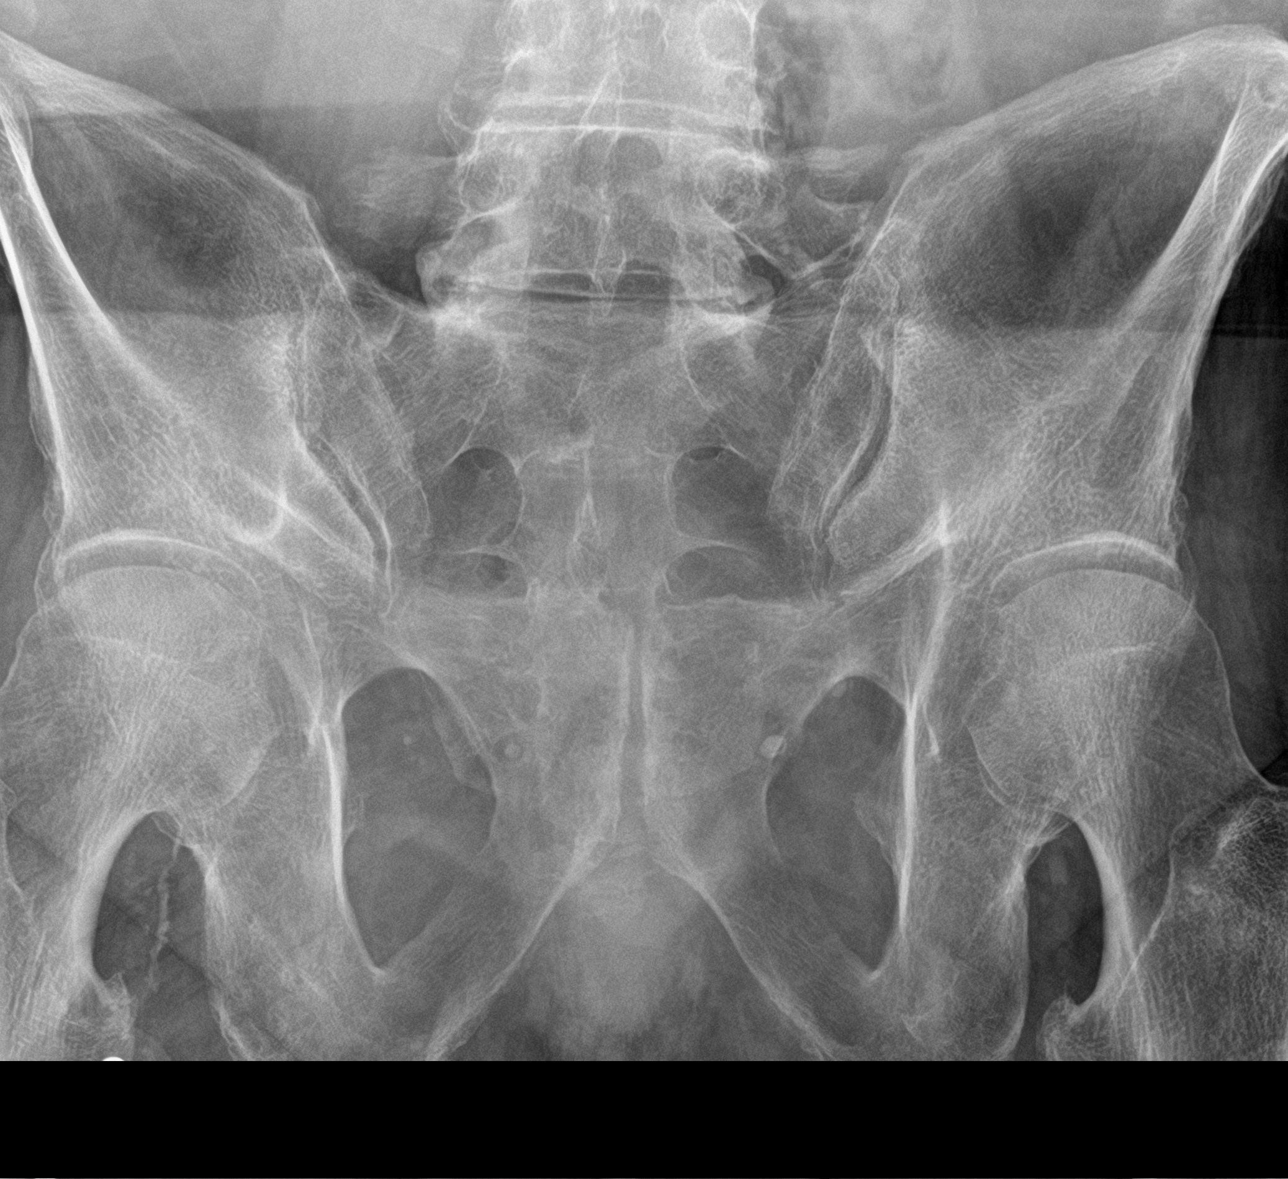
[im 3/4]
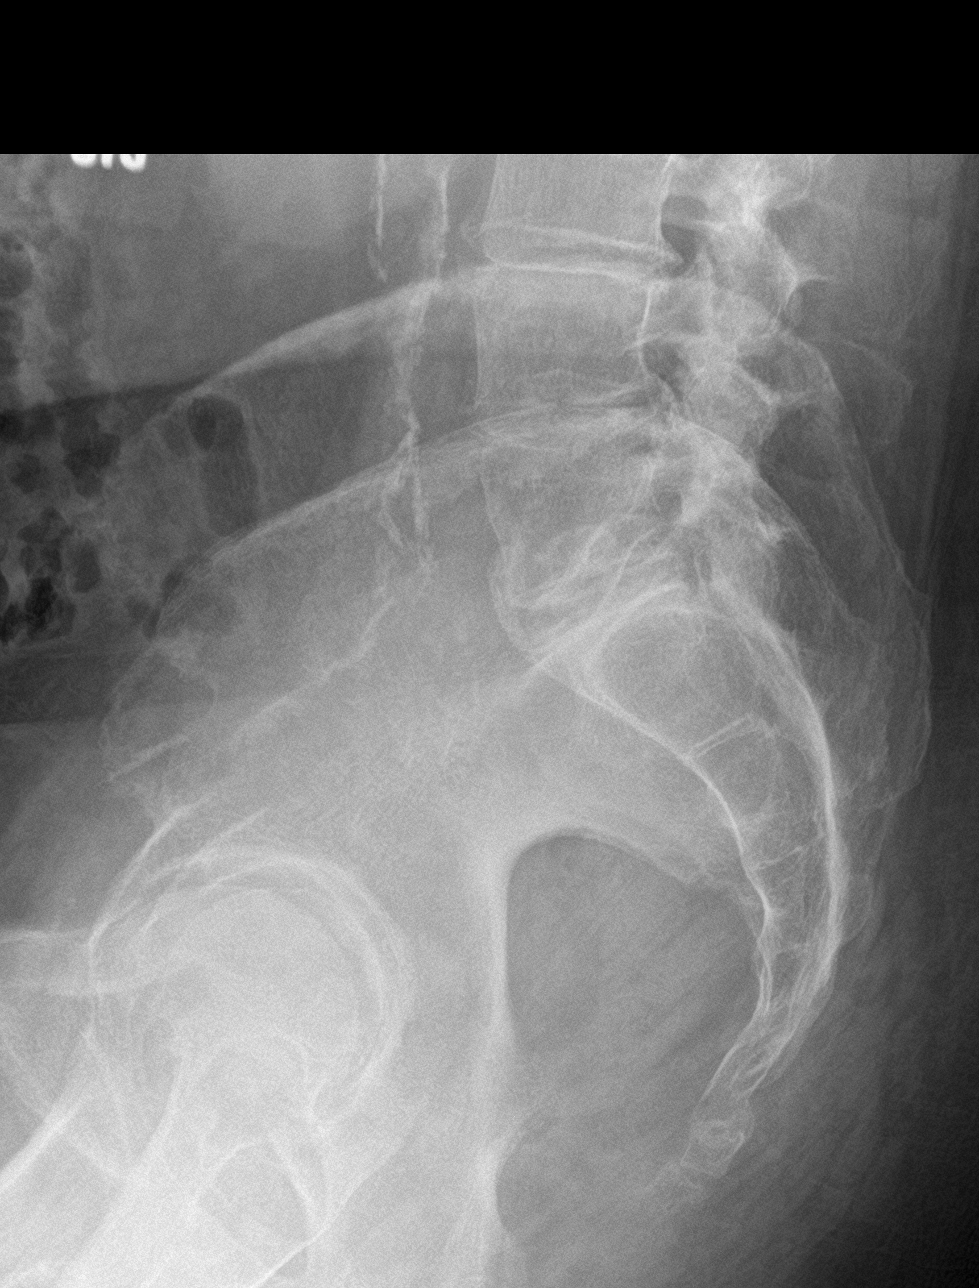
[im 4/4]
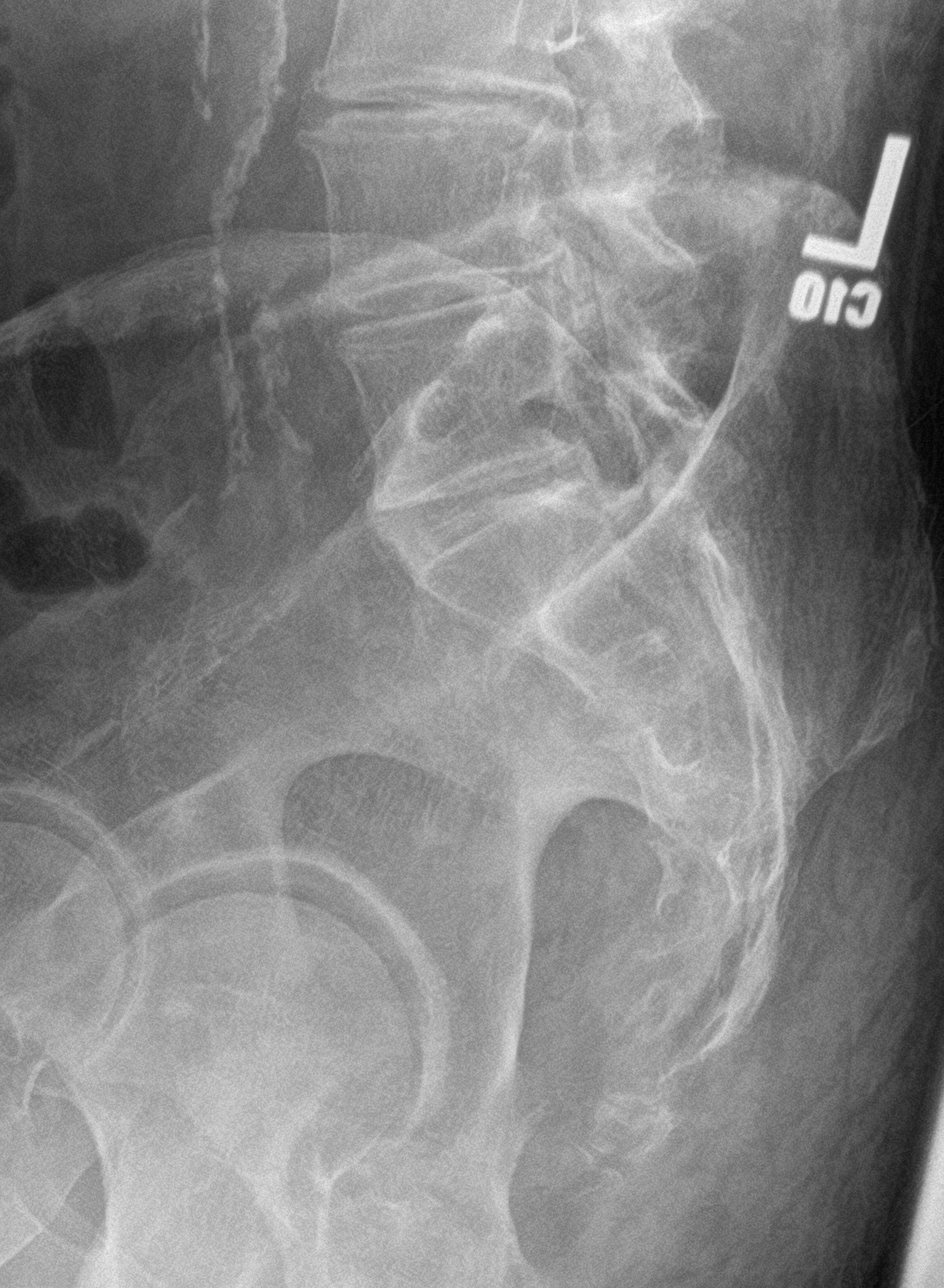

[4 of 4 positions shown; findings below may reference images not displayed]

FINDINGS: Os is alignment is normal. No acute or suspicious osseous lesion. No
fracture line or displaced fracture fragment seen. Atherosclerosis
of the infrarenal abdominal aorta. Adjacent soft tissues are
otherwise unremarkable.. Mild degenerative spondylosis noted in the
lower lumbar spine.
IMPRESSION: 1. No acute findings.
2. Mild degenerative change in the lower lumbar spine.
3. Aortic atherosclerosis.

## 2020-08-24 ENCOUNTER — Encounter: Payer: Self-pay | Admitting: Internal Medicine

## 2020-08-24 ENCOUNTER — Other Ambulatory Visit: Payer: Self-pay | Admitting: Hospice and Palliative Medicine

## 2020-08-24 MED ORDER — HYDROCODONE-ACETAMINOPHEN 5-325 MG PO TABS: 1.0000 | ORAL_TABLET | Freq: Four times a day (QID) | ORAL | 0 refills | Status: DC | PRN

## 2020-08-24 NOTE — Progress Notes (Signed)
Patient requested refill of Norco. PDMP reviewed. Refill sent.

## 2020-08-31 DIAGNOSIS — R0602 Shortness of breath: Secondary | ICD-10-CM | POA: Diagnosis not present

## 2020-08-31 DIAGNOSIS — J95811 Postprocedural pneumothorax: Secondary | ICD-10-CM | POA: Diagnosis not present

## 2020-08-31 DIAGNOSIS — R0600 Dyspnea, unspecified: Secondary | ICD-10-CM | POA: Diagnosis not present

## 2020-08-31 DIAGNOSIS — J9611 Chronic respiratory failure with hypoxia: Secondary | ICD-10-CM | POA: Diagnosis not present

## 2020-08-31 DIAGNOSIS — J9621 Acute and chronic respiratory failure with hypoxia: Secondary | ICD-10-CM | POA: Diagnosis not present

## 2020-09-15 ENCOUNTER — Other Ambulatory Visit: Payer: Self-pay | Admitting: Family Medicine

## 2020-09-15 ENCOUNTER — Encounter: Payer: Self-pay | Admitting: Internal Medicine

## 2020-09-17 ENCOUNTER — Other Ambulatory Visit: Payer: Self-pay | Admitting: Family Medicine

## 2020-09-19 ENCOUNTER — Other Ambulatory Visit: Payer: Self-pay | Admitting: *Deleted

## 2020-09-19 MED ORDER — HYDROCODONE-ACETAMINOPHEN 5-325 MG PO TABS: 1.0000 | ORAL_TABLET | Freq: Four times a day (QID) | ORAL | 0 refills | Status: DC | PRN

## 2020-09-19 NOTE — Telephone Encounter (Signed)
Per 07/18/20 OV note, pt had stopped Crestor 10 mg due to causing leg swelling.   Spoke with pt asking if he has restarted med.  Denies he has.  Notified him request will be denied.    Refill denied.

## 2020-09-19 NOTE — Telephone Encounter (Signed)
Patient sent mychart message for RF request for patient's Norco.

## 2020-09-20 ENCOUNTER — Ambulatory Visit (INDEPENDENT_AMBULATORY_CARE_PROVIDER_SITE_OTHER): Payer: Medicare Other | Admitting: Internal Medicine

## 2020-09-20 ENCOUNTER — Other Ambulatory Visit: Payer: Self-pay

## 2020-09-20 ENCOUNTER — Encounter: Payer: Self-pay | Admitting: Internal Medicine

## 2020-09-20 VITALS — BP 134/64 | HR 65 | Temp 98.5°F

## 2020-09-20 DIAGNOSIS — E119 Type 2 diabetes mellitus without complications: Secondary | ICD-10-CM | POA: Diagnosis not present

## 2020-09-20 DIAGNOSIS — R609 Edema, unspecified: Secondary | ICD-10-CM | POA: Diagnosis not present

## 2020-09-20 DIAGNOSIS — I739 Peripheral vascular disease, unspecified: Secondary | ICD-10-CM | POA: Diagnosis not present

## 2020-09-20 DIAGNOSIS — S80822A Blister (nonthermal), left lower leg, initial encounter: Secondary | ICD-10-CM | POA: Diagnosis not present

## 2020-09-20 LAB — BRAIN NATRIURETIC PEPTIDE: Pro B Natriuretic peptide (BNP): 90 pg/mL (ref 0.0–100.0)

## 2020-09-20 MED ORDER — FUROSEMIDE 20 MG PO TABS: 20.0000 mg | ORAL_TABLET | Freq: Every day | ORAL | 0 refills | Status: DC

## 2020-09-20 NOTE — Patient Instructions (Signed)
Blisters, Adult  A blister is a raised bubble of skin filled with liquid. Blisters often develop on skin that rubs or presses against another surface repeatedly (friction blister). Friction blisters can occur on any part of the body, but they usually form on the hands or the feet. Long-term pressure on that same area of skin can also cause the area of skin to become hardened. This hardened skin is called a callus. What are the causes? Besides friction, blisters can be caused by:  An injury, such as a burn.  An allergic reaction.  An infection.  Exposure to irritating chemicals. Friction blisters often occur in areas with a lot of heat and moisture. Friction blisters often result from:  Sports.  Repetitive activities.  Using tools and doing other activities without wearing gloves.  Shoes that are too tight or too loose. What are the signs or symptoms? A blister is often round and looks like a bump. It may hurt or feel itchy. Before a blister forms, your skin may:  Turn red.  Feel warm.  Itch.  Be painful to the touch. How is this diagnosed? A blister is diagnosed with a physical exam. How is this treated? Treatment usually involves protecting the area where the blister has formed until your skin has healed. Treatments may include:  Using a bandage (dressing) to cover your blister.  Putting extra padding around and over your blister so that the blister does not rub on anything.  Applying antibiotic ointment. Most blisters break open, dry up, and go away on their own within 1-2 weeks. Blisters that are very painful may be drained before they break open on their own. If your blister is large or painful, your health care provider can drain it. Follow these instructions at home: Medicines  Take or apply over-the-counter and prescription medicines only as told by your health care provider.  If you were prescribed an antibiotic medicine, take or apply it as told by your health  care provider. Do not stop using the antibiotic even if you start to feel better. Skin care  Do not pop your blister. This can cause infection.  Keep your blister clean and dry. This helps to prevent infection.  Before you swim or use a hot tub, cover your blister with a waterproof dressing.  Protect the area where your blister has formed as told by your health care provider.  Follow instructions from your health care provider about how to take care of your blister. Make sure you: ? Wash your hands with soap and water for at least 20 seconds before and after you change your dressing. If soap and water are not available, use hand sanitizer. ? Change your dressing as told by your health care provider. Infection signs Check your blister every day for signs of infection. Check for:  More redness, swelling, or pain.  More fluid or blood.  Warmth.  Pus or a bad smell. General instructions  If you have a blister on a foot or toe, wear different shoes until your blister heals.  Avoid the activity that caused the blister until your blister heals.  Keep all follow-up visits as told by your health care provider. This is important. How is this prevented? Taking these steps can help to prevent blisters that are caused by friction. Make sure you:  Wear comfortable shoes that fit well.  Always wear socks with shoes.  Wear extra socks or use tape, dressings, or pads over blister-prone areas as needed. You may also  apply petroleum jelly under dressings in blister-prone areas.  Wear protective gear, such as gloves, when taking part in sports or activities that can cause blisters.  Wear loose-fitting, moisture-wicking clothes when taking part in sports or activities.  Use powders as needed to keep your feet dry. Contact a health care provider if:  You have more redness, swelling, or pain around your blister.  You have more fluid or blood coming from your blister.  Your blister feels  warm to the touch.  You have pus or a bad smell coming from your blister.  You have a fever or chills.  Your blister gets better and then it gets worse. Summary  A blister is a raised bubble of skin filled with liquid. Blisters often develop on skin that rubs or presses against another surface repeatedly (friction blister).  Most blisters break open, dry up, and go away on their own within 1-2 weeks.  Keep your blister clean and dry. This helps to prevent infection.  Take steps to help prevent blisters that are caused by friction.  Contact a health care provider if you have signs of infection. This information is not intended to replace advice given to you by your health care provider. Make sure you discuss any questions you have with your health care provider. Document Revised: 04/16/2019 Document Reviewed: 04/16/2019 Elsevier Patient Education  2021 [employer].

## 2020-09-20 NOTE — Progress Notes (Signed)
Subjective:    Patient ID: Joseph Graham, male    DOB: 08/13/1940, 80 y.o.   MRN: [id number]  HPI  Pt presents to the clinic today with c/o blister on his left lower leg. He noticed this about 5 days ago. He reports the area has been draining. He has cleansed the area with Hydrogen Peroxide and has been applying Neosporin OTC on it.  He is borderline diabetic, A1C of 6.6%, 07/2020. He is not taking any oral diabetic medication.  He reports persistent BLE edema for years. Echo from 12/2015 showed no evidence of CHF but EF of 50-55%. He has a history of CHRF d/t COPD on continuous oxygen.  Review of Systems  Past Medical History:  Diagnosis Date  . Alcohol abuse, in remission    remote   . Arthritis    BACK AND NECK  . Benign essential tremor    improved on metoprolol and gabapentin  . BPH (benign prostatic hypertrophy) 12/30/2012  . Cardiac arrhythmia 03/2010   h/o a flutter and CM, s/p ablation, normal stress test  . Cataract    bilat removed   . Chemotherapy-induced neutropenia ([place]) 09/23/2015  . Chronic low back pain    MRI 2004, multilevel DDD  . Colon polyps    next colonoscopy due around 2018  . Complication of anesthesia age [age]   DID NOT GET COMPLETELY NUMB WITH TONSILLECTOMY  . COPD (chronic obstructive pulmonary disease) ([place])   . Coronary artery disease   . Dyslipidemia    mild off meds  . GERD (gastroesophageal reflux disease)   . History of kidney stones    h/o  . HTN (hypertension)   . Macular degeneration, bilateral   . Multiple pulmonary nodules 02/2015  . Neuromuscular disorder ([place])    neuropathy from chemo....in feet  . Peripheral neuropathy due to chemotherapy ([place]) 12/23/2015   Saw Dr Manuella Ghazi, on gabapentin and cymbalta with hydrocodone PRN (03/2016)  . Personal history of tobacco use, presenting hazards to health 02/18/2015   quit 06/2011, restarted 2014  . Pneumothorax after biopsy 04/01/2015  . Pre-diabetes   . Primary cancer of bladder ([place]) 2016    Budzyn  . Primary lung cancer ([place]) 2016   port a cath removed 03/2016  . Shortness of breath dyspnea    OCC WITH EXERTION    Current Outpatient Medications  Medication Sig Dispense Refill  . albuterol (PROAIR HFA) 108 (90 Base) MCG/ACT inhaler Inhale 2 puffs into the lungs every 6 (six) hours as needed for wheezing or shortness of breath. 18 g 3  . ASPIRIN 81 PO Take 1 tablet by mouth daily.    Marland Kitchen azelastine (ASTELIN) 0.1 % nasal spray Place 1 spray into both nostrils 2 (two) times daily. Use in each nostril as directed 30 mL 12  . Cholecalciferol (VITAMIN D-3) 125 MCG (5000 UT) TABS Take 5,000 Units by mouth daily.    Marland Kitchen gabapentin (NEURONTIN) 300 MG capsule Take 300 mg by mouth 3 (three) times daily. (Take with 600 mg for total of 900 mg three times daily)    . gabapentin (NEURONTIN) 600 MG tablet Take 600 mg by mouth 3 (three) times daily. (Take with 300 mg for total of 900 mg three times daily)    . HYDROcodone-acetaminophen (NORCO/VICODIN) 5-325 MG tablet Take 1 tablet by mouth every 6 (six) hours as needed for moderate pain. 90 tablet 0  . lisinopril (ZESTRIL) 10 MG tablet TAKE 1 TABLET BY MOUTH EVERY EVENING 90 tablet 0  .  metoprolol tartrate (LOPRESSOR) 25 MG tablet TAKE 1/2 TABLET BY MOUTH TWICE DAILY 90 tablet 0  . montelukast (SINGULAIR) 10 MG tablet Take 1 tablet (10 mg total) by mouth at bedtime. 30 tablet 3  . Multiple Vitamin (MULTIVITAMIN WITH MINERALS) TABS tablet Take 1 tablet by mouth every evening.    . NON FORMULARY Take 500 mg by mouth daily. Hemp Oil 500 mg    . omeprazole (PRILOSEC) 20 MG capsule Take 20 mg by mouth every morning.     . OXYGEN Inhale 2 L into the lungs daily. Using 4 L    . oxymetazoline (AFRIN) 0.05 % nasal spray Place 1 spray into both nostrils 2 (two) times daily. As needed    . polyethylene glycol (MIRALAX / GLYCOLAX) packet Take 17 g by mouth daily.    . primidone (MYSOLINE) 50 MG tablet Take 150 mg by mouth 2 (two) times daily.    . vitamin  B-12 (CYANOCOBALAMIN) 100 MCG tablet Take 100 mcg by mouth every evening.    . vitamin E 400 UNIT capsule Take 400 Units by mouth every evening.      No current facility-administered medications for this visit.    Allergies  Allergen Reactions  . Hm Lidocaine Patch [Lidocaine]     Loss of muscle strength in legs    Family History  Problem Relation Age of Onset  . Cervical cancer Mother 107  . Hypertension Father   . Tremor Father        and several uncles/aunts (not parkinson's)  . Stroke Brother 28  . Bladder Cancer Brother 83  . Prostate cancer Neg Hx   . Kidney cancer Neg Hx   . Colon cancer Neg Hx   . Colon polyps Neg Hx   . Esophageal cancer Neg Hx   . Rectal cancer Neg Hx     Social History   Socioeconomic History  . Marital status: Married    Spouse name: Not on file  . Number of children: Not on file  . Years of education: Not on file  . Highest education level: Not on file  Occupational History  . Not on file  Tobacco Use  . Smoking status: Former Smoker    Packs/day: 1.50    Years: 61.00    Pack years: 91.50    Types: Cigarettes    Quit date: 03/08/2015    Years since quitting: 5.5  . Smokeless tobacco: Never Used  . Tobacco comment: cut back 0.5 cigarettes--stopped 03/07/15; now now chantix  Vaping Use  . Vaping Use: Every day  . Substances: Nicotine  Substance and Sexual Activity  . Alcohol use: No    Alcohol/week: 0.0 standard drinks    Comment: has not drank in 37 years  . Drug use: No  . Sexual activity: Never  Other Topics Concern  . Not on file  Social History Narrative   Caffeine: 1 cup decaf/day   Lives with wife   Occupation: retired, worked Press photographer   Activity: gym 3x/wk, Immunologist, hobbies (paint, building things)   Diet: healthy, good water, fruits/vegetables daily, red meat 1x/wk   Social Determinants of Radio broadcast assistant Strain: Not on file  Food Insecurity: Not on file  Transportation Needs: Not on file  Physical  Activity: Not on file  Stress: Not on file  Social Connections: Not on file  Intimate Partner Violence: Not on file     Constitutional: Denies fever, malaise, fatigue, headache or abrupt weight changes.  Respiratory:  Pt reports chronic SOB. Denies difficulty breathing, cough or sputum production.   Cardiovascular: Pt reports chronic swelling in feet. Denies chest pain, chest tightness, palpitations or swelling in the hands..  Skin: Pt reports blister to LLE, weeping.   No other specific complaints in a complete review of systems (except as listed in HPI above).     Objective:   Physical Exam  BP 134/64   Pulse 65   Temp 98.5 F (36.9 C) (Temporal)   SpO2 94%   Wt Readings from Last 3 Encounters:  07/04/20 180 lb (81.6 kg)  05/04/20 180 lb (81.6 kg)  05/03/20 188 lb (85.3 kg)    General: Appears his stated age, chronically ill appearing, in NAD. Skin: 4 cm open area where blister popped to left anterior shin, weeping serous fluid. Vascular changes noted of BLE. Cardiovascular: Normal rate and rhythm. Distant S1,S2 noted.  1 + pitting BLE edema noted. Pulmonary/Chest: Diminished throughout. No noted rales. Musculoskeletal: In wheelchair.  Neurological: Alert and oriented.    BMET    Component Value Date/Time   NA 138 07/12/2020 0757   NA 141 07/14/2018 1609   K 4.3 07/12/2020 0757   K 4.4 08/08/2010 0000   CL 94 (L) 07/12/2020 0757   CO2 39 (H) 07/12/2020 0757   GLUCOSE 114 (H) 07/12/2020 0757   BUN 23 07/12/2020 0757   BUN 22 07/14/2018 1609   CREATININE 0.51 07/12/2020 0757   CREATININE 0.68 08/08/2010 0000   CALCIUM 9.4 07/12/2020 0757   GFRNONAA >60 05/01/2020 0538   GFRAA >60 02/24/2020 0821    Lipid Panel     Component Value Date/Time   CHOL 168 07/12/2020 0757   CHOL 166 08/08/2010 0000   TRIG 99.0 07/12/2020 0757   TRIG 94 08/08/2010 0000   HDL 49.60 07/12/2020 0757   CHOLHDL 3 07/12/2020 0757   VLDL 19.8 07/12/2020 0757   LDLCALC 99 07/12/2020  0757    CBC    Component Value Date/Time   WBC 7.7 05/01/2020 0538   RBC 3.46 (L) 05/01/2020 0538   HGB 11.0 (L) 05/01/2020 0538   HCT 34.9 (L) 05/01/2020 0538   HCT 48 03/15/2010 0000   PLT 201 05/01/2020 0538   MCV 100.9 (H) 05/01/2020 0538   MCH 31.8 05/01/2020 0538   MCHC 31.5 05/01/2020 0538   RDW 13.2 05/01/2020 0538   LYMPHSABS 1.1 04/27/2020 0524   MONOABS 0.9 04/27/2020 0524   EOSABS 0.2 04/27/2020 0524   BASOSABS 0.0 04/27/2020 0524    Hgb A1C Lab Results  Component Value Date   HGBA1C 6.6 (H) 07/12/2020           Assessment & Plan:   Blister of LLE, DM 2, PVD:  Not infected but he is borderline DM2 so we will need to be careful to monitor for s/s of infection Area cleansed, covered with Vaseline gauze, Telfa, ABD pad and wrapped with ACE wrap for compression Advised caregiver to change daily or when saturated  Peripheral Edema:  Concern for CHF BNP today RX for Furosemide 20 mg daily x 5 days- kidney function from 07/2020 reviewed Encouraged low salt diet and elevation Consider repeat 2D echo pending labs  Will follow up after labs, return precautions discussed  Webb Silversmith, NP This visit occurred during the SARS-CoV-2 public health emergency.  Safety protocols were in place, including screening questions prior to the visit, additional usage of staff PPE, and extensive cleaning of exam room while observing appropriate contact time as indicated  for disinfecting solutions.

## 2020-09-25 ENCOUNTER — Encounter: Payer: Self-pay | Admitting: Family Medicine

## 2020-09-28 ENCOUNTER — Ambulatory Visit (INDEPENDENT_AMBULATORY_CARE_PROVIDER_SITE_OTHER): Payer: Medicare Other | Admitting: Family Medicine

## 2020-09-28 ENCOUNTER — Other Ambulatory Visit: Payer: Self-pay | Admitting: Family Medicine

## 2020-09-28 ENCOUNTER — Other Ambulatory Visit: Payer: Self-pay

## 2020-09-28 ENCOUNTER — Encounter: Payer: Self-pay | Admitting: Family Medicine

## 2020-09-28 VITALS — BP 140/58 | HR 68 | Temp 97.7°F | Ht 69.0 in

## 2020-09-28 DIAGNOSIS — J449 Chronic obstructive pulmonary disease, unspecified: Secondary | ICD-10-CM | POA: Diagnosis not present

## 2020-09-28 DIAGNOSIS — I739 Peripheral vascular disease, unspecified: Secondary | ICD-10-CM | POA: Diagnosis not present

## 2020-09-28 DIAGNOSIS — I10 Essential (primary) hypertension: Secondary | ICD-10-CM | POA: Diagnosis not present

## 2020-09-28 DIAGNOSIS — G620 Drug-induced polyneuropathy: Secondary | ICD-10-CM

## 2020-09-28 DIAGNOSIS — G250 Essential tremor: Secondary | ICD-10-CM

## 2020-09-28 DIAGNOSIS — T451X5A Adverse effect of antineoplastic and immunosuppressive drugs, initial encounter: Secondary | ICD-10-CM

## 2020-09-28 DIAGNOSIS — I872 Venous insufficiency (chronic) (peripheral): Secondary | ICD-10-CM

## 2020-09-28 DIAGNOSIS — J9611 Chronic respiratory failure with hypoxia: Secondary | ICD-10-CM | POA: Diagnosis not present

## 2020-09-28 DIAGNOSIS — M533 Sacrococcygeal disorders, not elsewhere classified: Secondary | ICD-10-CM | POA: Diagnosis not present

## 2020-09-28 DIAGNOSIS — R600 Localized edema: Secondary | ICD-10-CM | POA: Diagnosis not present

## 2020-09-28 DIAGNOSIS — L97821 Non-pressure chronic ulcer of other part of left lower leg limited to breakdown of skin: Secondary | ICD-10-CM

## 2020-09-28 MED ORDER — FUROSEMIDE 40 MG PO TABS: 40.0000 mg | ORAL_TABLET | Freq: Every day | ORAL | 1 refills | Status: DC

## 2020-09-28 MED ORDER — POTASSIUM CHLORIDE ER 10 MEQ PO TBCR: 10.0000 meq | EXTENDED_RELEASE_TABLET | Freq: Every day | ORAL | 0 refills | Status: DC

## 2020-09-28 NOTE — Telephone Encounter (Signed)
Pt having problems answering by my chart. Pt called and said the lasix did not help pt. Pt will be at [facility] at 1 PM for appt per Dr Darnell Level.

## 2020-09-28 NOTE — Patient Instructions (Signed)
Start lasix 40mg  daily for 5 days - sent to pharmacy. Take potassium 80mEq daily while on lasix.  We will refer you for artery evaluation Schedule follow up in 1 week.

## 2020-09-28 NOTE — Telephone Encounter (Signed)
Can we schedule pt today at 1pm?

## 2020-09-28 NOTE — Progress Notes (Signed)
Patient ID: MACALLAN ORD, male    DOB: September 12, 1940, 80 y.o.   MRN: [id number]  This visit was conducted in person.  BP (!) 140/58   Pulse 68   Temp 97.7 F (36.5 C) (Temporal)   Ht 5\' 9"  (1.753 m)   SpO2 96% Comment: 4 L  BMI 26.58 kg/m    CC: bilat leg swelling  Subjective:   HPI: Joseph Graham is a 80 y.o. male presenting on 09/28/2020 for Leg Swelling (C/o swelling in bilateral legs.  Started about yrs ago.  Pt accompanied by wife, Mardene Celeste- temp 97.9.)   See prior note for details. Saw [facility] last week for same issue. BNP at that time was 90. Treated with 5d lasix 20mg  - without any benefit noted, no increased UOP noted.  Not checking weight at home - due to difficulty with ambulation/balance - notes progressively worsening. He feels he's losing weight. Encouraged effort to check daily weights at home if they feel this can be done safely  Pedal edema - ongoing for years. Blistering to R anterior lower leg started 2 wks ago. No swelling to arms or abdomen.   Ongoing peripheral neuropathy followed by Dr Manuella Ghazi @ [facility]. Continues gabapentin 900mg  TID. On primidone 150mg  BID for tremor. Worried he's taking too much medicine - notes flashing lights to eyes as well as ongoing muscle twitching/jerking. In the past 3-4 weeks he's cut down doses to gabapentin 300mg  tid and primidone to 100mg  bid with possible benefit.   Notes ongoing knee pain. Takes hydrocodone 5/325mg  QID scheduled for chronic coccygeal pain. Denies open sore to area.      Relevant past medical, surgical, family and social history reviewed and updated as indicated. Interim medical history since our last visit reviewed. Allergies and medications reviewed and updated. Outpatient Medications Prior to Visit  Medication Sig Dispense Refill  . albuterol (PROAIR HFA) 108 (90 Base) MCG/ACT inhaler Inhale 2 puffs into the lungs every 6 (six) hours as needed for wheezing or shortness of breath. 18 g 3  . ASPIRIN 81 PO Take 1  tablet by mouth daily.    Marland Kitchen azelastine (ASTELIN) 0.1 % nasal spray Place 1 spray into both nostrils 2 (two) times daily. Use in each nostril as directed 30 mL 12  . gabapentin (NEURONTIN) 300 MG capsule Take 300 mg by mouth 3 (three) times daily. (Take with 600 mg for total of 900 mg three times daily)    . HYDROcodone-acetaminophen (NORCO/VICODIN) 5-325 MG tablet Take 1 tablet by mouth every 6 (six) hours as needed for moderate pain. 90 tablet 0  . lisinopril (ZESTRIL) 10 MG tablet TAKE 1 TABLET BY MOUTH EVERY EVENING 90 tablet 0  . metoprolol tartrate (LOPRESSOR) 25 MG tablet TAKE 1/2 TABLET BY MOUTH TWICE DAILY 90 tablet 0  . Multiple Vitamin (MULTIVITAMIN WITH MINERALS) TABS tablet Take 1 tablet by mouth every evening.    . NON FORMULARY Take 500 mg by mouth daily. Hemp Oil 500 mg    . omeprazole (PRILOSEC) 20 MG capsule Take 20 mg by mouth every morning.     . OXYGEN Inhale 2 L into the lungs daily. Using 4 L    . oxymetazoline (AFRIN) 0.05 % nasal spray Place 1 spray into both nostrils 2 (two) times daily. As needed    . polyethylene glycol (MIRALAX / GLYCOLAX) packet Take 17 g by mouth daily.    . vitamin B-12 (CYANOCOBALAMIN) 100 MCG tablet Take 100 mcg by mouth every evening.    Marland Kitchen  vitamin E 400 UNIT capsule Take 400 Units by mouth every evening.     . furosemide (LASIX) 20 MG tablet Take 1 tablet (20 mg total) by mouth daily. 5 tablet 0  . primidone (MYSOLINE) 50 MG tablet Take 150 mg by mouth 2 (two) times daily.    . primidone (MYSOLINE) 50 MG tablet Take 2 tablets (100 mg total) by mouth 2 (two) times daily.    . Cholecalciferol (VITAMIN D-3) 125 MCG (5000 UT) TABS Take 5,000 Units by mouth daily.    Marland Kitchen gabapentin (NEURONTIN) 600 MG tablet Take 600 mg by mouth 3 (three) times daily. (Take with 300 mg for total of 900 mg three times daily) (Patient not taking: Reported on 09/20/2020)    . montelukast (SINGULAIR) 10 MG tablet Take 1 tablet (10 mg total) by mouth at bedtime. 30 tablet 3    No facility-administered medications prior to visit.     Per HPI unless specifically indicated in ROS section below Review of Systems Objective:  BP (!) 140/58   Pulse 68   Temp 97.7 F (36.5 C) (Temporal)   Ht 5\' 9"  (1.753 m)   SpO2 96% Comment: 4 L  BMI 26.58 kg/m   Wt Readings from Last 3 Encounters:  09/30/20 180 lb (81.6 kg)  07/04/20 180 lb (81.6 kg)  05/04/20 180 lb (81.6 kg)      Physical Exam Vitals reviewed.  Constitutional:      Appearance: Normal appearance. He is ill-appearing.     Comments:  O2 via [place] In wheelchair  Cardiovascular:     Rate and Rhythm: Normal rate and regular rhythm.     Pulses: Normal pulses.     Heart sounds: Normal heart sounds. No murmur heard.   Pulmonary:     Effort: Pulmonary effort is normal. No respiratory distress.     Breath sounds: No wheezing, rhonchi or rales.     Comments: LLL crackles Musculoskeletal:        General: Swelling and tenderness (tender swelling throughout lower legs distal to knees) present.     Right lower leg: Edema (1+ pitting) present.     Left lower leg: Edema (1+ pitting) present.     Comments: Diminished pedal pulses  Skin:    General: Skin is warm and dry.     Findings: Wound present.          Comments: Open shallow sore of denuded skin 6x8cm diameter L anterior medial lower leg  Neurological:     Mental Status: He is alert.       Results for orders placed or performed in visit on 09/20/20  Brain natriuretic peptide  Result Value Ref Range   Pro B Natriuretic peptide (BNP) 90.0 0.0 - 100.0 pg/mL   Lab Results  Component Value Date   CREATININE 0.51 07/12/2020   BUN 23 07/12/2020   NA 138 07/12/2020   K 4.3 07/12/2020   CL 94 (L) 07/12/2020   CO2 39 (H) 07/12/2020    Lab Results  Component Value Date   WBC 7.7 05/01/2020   HGB 11.0 (L) 05/01/2020   HCT 34.9 (L) 05/01/2020   MCV 100.9 (H) 05/01/2020   PLT 201 05/01/2020    Lab Results  Component Value Date   TSH 0.65  12/31/2013   Lab Results  Component Value Date   VITAMINB12 754 07/12/2020    Lab Results  Component Value Date   HGBA1C 6.6 (H) 07/12/2020    Assessment & Plan:  This  visit occurred during the SARS-CoV-2 public health emergency.  Safety protocols were in place, including screening questions prior to the visit, additional usage of staff PPE, and extensive cleaning of exam room while observing appropriate contact time as indicated for disinfecting solutions.   Problem List Items Addressed This Visit    Peripheral neuropathy due to chemotherapy ([place]) (Chronic)    Neuropathy started after chemo for lung cancer  Continue lower gabapentin dose (300mg  TID).       Relevant Medications   primidone (MYSOLINE) 50 MG tablet   Chronic coccyx pain (Secondary Area of Pain) ([place]) (Chronic)    Continue hydrocodone through pain management (palliative care).       HTN (hypertension)    BP adequate on current regimen.       Relevant Medications   furosemide (LASIX) 40 MG tablet   Benign essential tremor    Continue lower primidone dose (through neurology)      PAD (peripheral artery disease) ([place])    H/o this. Continues aspirin.  Consider statin.  Prior to placing unna boot, check ABIs.       Relevant Medications   furosemide (LASIX) 40 MG tablet   COPD, severe (HCC)    Severe oxygen dependent COPD.  Didn't feel respiratory medications provided benefit so off these. Consider retrial in the future.       Pedal edema - Primary    Longstanding issue, recently acutely worse associated with large denuded blister to anterior LLE with residual poorly healing sore. See below.  No benefit with lasix 20- will increase dose to 40mg  daily x 5 days, reassess. Add potassium while on lasix.       Relevant Orders   VAS [place] LOWER EXT ART SEG MULTI (SEGMENTALS & LE RAYNAUDS)   Ambulatory referral to Vascular Surgery   Chronic respiratory failure with hypoxia ([place])   Stasis ulcer ([place])     Presumed venous ulcer to LLE - dressed and wrapped with petroleum gauze in office, reviewed ongoing woun care at home.  Rx lasix to help with pedal edema. Check ABIs to eval arterial insufficiency contribution, and prior to treating with unna boot.       Relevant Orders   VAS [place] LOWER EXT ART SEG MULTI (SEGMENTALS & LE RAYNAUDS)   Ambulatory referral to Vascular Surgery       Meds ordered this encounter  Medications  . furosemide (LASIX) 40 MG tablet    Sig: Take 1 tablet (40 mg total) by mouth daily. For 5 days then as needed    Dispense:  30 tablet    Refill:  1  . DISCONTD: potassium chloride (KLOR-CON) 10 MEQ tablet    Sig: Take 1 tablet (10 mEq total) by mouth daily. While on lasix    Dispense:  30 tablet    Refill:  0   Orders Placed This Encounter  Procedures  . Ambulatory referral to Vascular Surgery    Referral Priority:   Routine    Referral Type:   Surgical    Referral Reason:   Specialty Services Required    Requested Specialty:   Vascular Surgery    Number of Visits Requested:   1    Patient Instructions  Start lasix 40mg  daily for 5 days - sent to pharmacy. Take potassium 34mEq daily while on lasix.  We will refer you for artery evaluation Schedule follow up in 1 week.    Follow up plan: Return in about 1 week (around 10/05/2020) for follow up  visit.  [name], MD

## 2020-09-29 ENCOUNTER — Telehealth: Payer: Self-pay

## 2020-09-29 ENCOUNTER — Other Ambulatory Visit (INDEPENDENT_AMBULATORY_CARE_PROVIDER_SITE_OTHER): Payer: Self-pay | Admitting: Nurse Practitioner

## 2020-09-29 DIAGNOSIS — L97909 Non-pressure chronic ulcer of unspecified part of unspecified lower leg with unspecified severity: Secondary | ICD-10-CM

## 2020-09-29 NOTE — Telephone Encounter (Signed)
Wonderful thank you.

## 2020-09-29 NOTE — Telephone Encounter (Signed)
Dr Darnell Level, I was able to work patient in for ABI with [facility] instead but needed referral for their office. I have placed that already and it is sent for co sign. [facility] were booked out for weeks. Thank you. Patient is aware his appointment is tomorrow 09/30/20 at 1 pm

## 2020-09-30 ENCOUNTER — Ambulatory Visit (INDEPENDENT_AMBULATORY_CARE_PROVIDER_SITE_OTHER): Payer: Medicare Other

## 2020-09-30 ENCOUNTER — Other Ambulatory Visit: Payer: Self-pay | Admitting: Family Medicine

## 2020-09-30 ENCOUNTER — Other Ambulatory Visit: Payer: Self-pay

## 2020-09-30 ENCOUNTER — Ambulatory Visit (INDEPENDENT_AMBULATORY_CARE_PROVIDER_SITE_OTHER): Payer: Medicare Other | Admitting: Nurse Practitioner

## 2020-09-30 VITALS — BP 131/45 | HR 67 | Ht 68.0 in | Wt 180.0 lb

## 2020-09-30 DIAGNOSIS — I872 Venous insufficiency (chronic) (peripheral): Secondary | ICD-10-CM | POA: Diagnosis not present

## 2020-09-30 DIAGNOSIS — E118 Type 2 diabetes mellitus with unspecified complications: Secondary | ICD-10-CM

## 2020-09-30 DIAGNOSIS — L97821 Non-pressure chronic ulcer of other part of left lower leg limited to breakdown of skin: Secondary | ICD-10-CM | POA: Diagnosis not present

## 2020-09-30 DIAGNOSIS — I739 Peripheral vascular disease, unspecified: Secondary | ICD-10-CM

## 2020-09-30 DIAGNOSIS — L97909 Non-pressure chronic ulcer of unspecified part of unspecified lower leg with unspecified severity: Secondary | ICD-10-CM

## 2020-09-30 DIAGNOSIS — I10 Essential (primary) hypertension: Secondary | ICD-10-CM

## 2020-09-30 DIAGNOSIS — I83009 Varicose veins of unspecified lower extremity with ulcer of unspecified site: Secondary | ICD-10-CM | POA: Insufficient documentation

## 2020-09-30 NOTE — Assessment & Plan Note (Addendum)
Longstanding issue, recently acutely worse associated with large denuded blister to anterior LLE with residual poorly healing sore. See below.  No benefit with lasix 20- will increase dose to 40mg  daily x 5 days, reassess. Add potassium while on lasix.

## 2020-09-30 NOTE — Assessment & Plan Note (Signed)
Continue lower primidone dose (through neurology)

## 2020-09-30 NOTE — Assessment & Plan Note (Signed)
BP adequate on current regimen.

## 2020-09-30 NOTE — Assessment & Plan Note (Signed)
Severe oxygen dependent COPD.  Didn't feel respiratory medications provided benefit so off these. Consider retrial in the future.

## 2020-09-30 NOTE — Telephone Encounter (Signed)
Noted.  Tried to contact [facility] to c/x 90-day rx and and let them know to only fill for 30 days.  However, I was on hold for >15 mins.  Will try to call again before I leave.

## 2020-09-30 NOTE — Assessment & Plan Note (Addendum)
H/o this. Continues aspirin.  Consider statin.  Prior to placing unna boot, check ABIs.

## 2020-09-30 NOTE — Telephone Encounter (Signed)
Message from pharmacy requesting 90-day rx.  E-scribed.  FYI to Dr. Darnell Level.

## 2020-09-30 NOTE — Assessment & Plan Note (Signed)
Presumed venous ulcer to LLE - dressed and wrapped with petroleum gauze in office, reviewed ongoing woun care at home.  Rx lasix to help with pedal edema. Check ABIs to eval arterial insufficiency contribution, and prior to treating with unna boot.

## 2020-09-30 NOTE — Assessment & Plan Note (Signed)
Continue hydrocodone through pain management (palliative care).

## 2020-09-30 NOTE — Assessment & Plan Note (Addendum)
Neuropathy started after chemo for lung cancer  Continue lower gabapentin dose (300mg  TID).

## 2020-09-30 NOTE — Telephone Encounter (Signed)
No - this is just PRN for now, not chronic daily med. Doesn't need 90d supply.

## 2020-10-01 ENCOUNTER — Encounter (INDEPENDENT_AMBULATORY_CARE_PROVIDER_SITE_OTHER): Payer: Self-pay | Admitting: Nurse Practitioner

## 2020-10-01 DIAGNOSIS — J9611 Chronic respiratory failure with hypoxia: Secondary | ICD-10-CM | POA: Diagnosis not present

## 2020-10-01 DIAGNOSIS — J95811 Postprocedural pneumothorax: Secondary | ICD-10-CM | POA: Diagnosis not present

## 2020-10-01 DIAGNOSIS — R0600 Dyspnea, unspecified: Secondary | ICD-10-CM | POA: Diagnosis not present

## 2020-10-01 DIAGNOSIS — J9621 Acute and chronic respiratory failure with hypoxia: Secondary | ICD-10-CM | POA: Diagnosis not present

## 2020-10-01 DIAGNOSIS — R0602 Shortness of breath: Secondary | ICD-10-CM | POA: Diagnosis not present

## 2020-10-01 NOTE — Progress Notes (Signed)
Subjective:    Patient ID: Joseph Graham, male    DOB: Jul 27, 1940, 80 y.o.   MRN: [id number] Chief Complaint  Patient presents with  . New Patient (Initial Visit)    Venous stasis other part of the left lower leg limited to break down of skin w/o VV referred by Danise Mina     Joseph Graham is an 80 year old male that presents today as a referral from Dr. Danise Mina for concern due to nonpalpable pulses and slow healing venous ulceration.  The wound initially began as a very large blister following an extended period of swelling.  Per the patient's wife he has had swelling for years however just recently worsened.  Then the worsening he had a golf ball sized blister that ruptured leaving an ulceration.  The patient also has weeping present from his left lower extremity.  Currently the patient is unable to tolerate wearing compression socks.  In fact even the slight dressing that the patient's wife places is barely tolerable for him.  Patient does have severe neuropathy.  The patient is also on home O2 as well.  He notes that he has previously had swelling much like this before and was given diuretics and that helped relieve the swelling.  The patient notes that his diuretics were recently increased by his primary care provider although he has not yet started taking the increased dosage just yet.  He denies any fever or chills.  Today noninvasive studies show an ABI of 0.82 on the right and 0.87 on the left.  The patient has biphasic monophasic waveforms bilaterally with good toe waveforms bilaterally.   Review of Systems  Cardiovascular: Positive for leg swelling.  Skin: Positive for wound.  Neurological: Positive for weakness.  All other systems reviewed and are negative.      Objective:   Physical Exam Vitals reviewed.  HENT:     Head: Normocephalic.  Cardiovascular:     Rate and Rhythm: Normal rate.     Comments: Hard to palpate pedal pulses due to edema Pulmonary:      Comments: Home O2 Musculoskeletal:     Right lower leg: Edema present.     Left lower leg: Edema present.  Skin:    General: Skin is moist.     Findings: Wound present.  Neurological:     Mental Status: He is alert and oriented to person, place, and time.  Psychiatric:        Mood and Affect: Mood normal.        Behavior: Behavior normal.        Thought Content: Thought content normal.        Judgment: Judgment normal.     BP (!) 131/45   Pulse 67   Ht 5\' 8"  (1.727 m)   Wt 180 lb (81.6 kg)   BMI 27.37 kg/m   Past Medical History:  Diagnosis Date  . Alcohol abuse, in remission    remote   . Arthritis    BACK AND NECK  . Benign essential tremor    improved on metoprolol and gabapentin  . BPH (benign prostatic hypertrophy) 12/30/2012  . Cardiac arrhythmia 03/2010   h/o a flutter and CM, s/p ablation, normal stress test  . Cataract    bilat removed   . Chemotherapy-induced neutropenia ([place]) 09/23/2015  . Chronic low back pain    MRI 2004, multilevel DDD  . Colon polyps    next colonoscopy due around 2018  . Complication of anesthesia age  [age]   DID NOT GET COMPLETELY NUMB WITH TONSILLECTOMY  . COPD (chronic obstructive pulmonary disease) ([place])   . Coronary artery disease   . Dyslipidemia    mild off meds  . GERD (gastroesophageal reflux disease)   . History of kidney stones    h/o  . HTN (hypertension)   . Macular degeneration, bilateral   . Multiple pulmonary nodules 02/2015  . Neuromuscular disorder ([place])    neuropathy from chemo....in feet  . Peripheral neuropathy due to chemotherapy ([place]) 12/23/2015   Saw Dr Manuella Ghazi, on gabapentin and cymbalta with hydrocodone PRN (03/2016)  . Personal history of tobacco use, presenting hazards to health 02/18/2015   quit 06/2011, restarted 2014  . Pneumothorax after biopsy 04/01/2015  . Pre-diabetes   . Primary cancer of bladder ([place]) 2016   [place]  . Primary lung cancer ([place]) 2016   port a cath removed 03/2016  . Shortness  of breath dyspnea    OCC WITH EXERTION    Social History   Socioeconomic History  . Marital status: Married    Spouse name: Not on file  . Number of children: Not on file  . Years of education: Not on file  . Highest education level: Not on file  Occupational History  . Not on file  Tobacco Use  . Smoking status: Former Smoker    Packs/day: 1.50    Years: 61.00    Pack years: 91.50    Types: Cigarettes    Quit date: 03/08/2015    Years since quitting: 5.5  . Smokeless tobacco: Never Used  . Tobacco comment: cut back 0.5 cigarettes--stopped 03/07/15; now now chantix  Vaping Use  . Vaping Use: Every day  . Substances: Nicotine  Substance and Sexual Activity  . Alcohol use: No    Alcohol/week: 0.0 standard drinks    Comment: has not drank in 37 years  . Drug use: No  . Sexual activity: Never  Other Topics Concern  . Not on file  Social History Narrative   Caffeine: 1 cup decaf/day   Lives with wife   Occupation: retired, worked Press photographer   Activity: gym 3x/wk, Immunologist, hobbies (paint, building things)   Diet: healthy, good water, fruits/vegetables daily, red meat 1x/wk   Social Determinants of Radio broadcast assistant Strain: Not on file  Food Insecurity: Not on file  Transportation Needs: Not on file  Physical Activity: Not on file  Stress: Not on file  Social Connections: Not on file  Intimate Partner Violence: Not on file    Past Surgical History:  Procedure Laterality Date  . A FLUTTER ABLATION  03/2010  . carotid US  03/2010   WNL  . CATARACT EXTRACTION    . COLONOSCOPY  09/2011   polyps, rpt due 5 yrs, mild diverticulosis [name])  . CYSTOSCOPY W/ RETROGRADES Bilateral 04/06/2015   Procedure: CYSTOSCOPY WITH RETROGRADE PYELOGRAM;  Surgeon: [name], MD;  Location: [facility];  Service: Urology;  Laterality: Bilateral;  . ELECTROMAGNETIC NAVIGATION BROCHOSCOPY N/A 03/02/2015   Procedure: ELECTROMAGNETIC NAVIGATION BRONCHOSCOPY;  Surgeon: [name], MD;  Location: [facility];  Service: Cardiopulmonary;  Laterality: N/A;  . 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 HEMORROIDECTOMY  1982  . LUNG BIOPSY     collapsed lung with chest tube  . POLYPECTOMY    . PORT-A-CATH REMOVAL Right 04/03/2016   Procedure: REMOVAL  PORT-A-CATH;  Surgeon: [name], MD;  Location: [facility];  Service: General;  Laterality: Right;  . PORTACATH PLACEMENT N/A 08/10/2015   Procedure: INSERTION PORT-A-CATH;  Surgeon: [name], MD;  Location: [facility];  Service: General;  Laterality: N/A;  . [place]  . TRANSURETHRAL RESECTION OF BLADDER TUMOR N/A 04/06/2015   Procedure: TRANSURETHRAL RESECTION OF BLADDER TUMOR (TURBT) right ureteral stent placement;  Surgeon: [name], MD;  Location: [facility];  Service: Urology;  Laterality: N/A;  . TRANSURETHRAL RESECTION OF BLADDER TUMOR N/A 07/13/2015   Procedure: TRANSURETHRAL RESECTION OF BLADDER TUMOR (TURBT);  Surgeon: [name], MD;  Location: [facility];  Service: Urology;  Laterality: N/A;  . urinary stent removal  04/14/15  . VIDEO ASSISTED THORACOSCOPY (VATS) W/TALC PLEUADESIS Right 04/27/2020   Procedure: VIDEO ASSISTED THORACOSCOPY (VATS) W/TALC PLEUADESIS;  Surgeon: [name], MD;  Location: [facility];  Service: Thoracic;  Laterality: Right;    Family History  Problem Relation Age of Onset  . Cervical cancer Mother 65  . Hypertension Father   . Tremor Father        and several uncles/aunts (not parkinson's)  . Stroke Brother 15  . Bladder Cancer Brother 2  . Prostate cancer Neg Hx   . Kidney cancer Neg Hx   . Colon cancer Neg Hx   . Colon polyps Neg Hx   . Esophageal cancer Neg Hx   . Rectal cancer Neg Hx     Allergies  Allergen Reactions  . Hm Lidocaine Patch [Lidocaine]     Loss of muscle strength in legs    CBC Latest Ref  Rng & Units 05/01/2020 04/30/2020 04/29/2020  WBC 4.0 - 10.5 K/uL 7.7 9.2 8.6  Hemoglobin 13.0 - 17.0 g/dL 11.0(L) 11.1(L) 10.5(L)  Hematocrit 39.0 - 52.0 % 34.9(L) 36.1(L) 33.4(L)  Platelets 150 - 400 K/uL 201 197 168      CMP     Component Value Date/Time   NA 138 07/12/2020 0757   NA 141 07/14/2018 1609   K 4.3 07/12/2020 0757   K 4.4 08/08/2010 0000   CL 94 (L) 07/12/2020 0757   CO2 39 (H) 07/12/2020 0757   GLUCOSE 114 (H) 07/12/2020 0757   BUN 23 07/12/2020 0757   BUN 22 07/14/2018 1609   CREATININE 0.51 07/12/2020 0757   CREATININE 0.68 08/08/2010 0000   CALCIUM 9.4 07/12/2020 0757   PROT 8.2 07/12/2020 0757   PROT 7.9 07/14/2018 1609   ALBUMIN 3.4 (L) 07/12/2020 0757   ALBUMIN 4.6 07/14/2018 1609   AST 24 07/12/2020 0757   AST 21 08/08/2010 0000   ALT 24 07/12/2020 0757   ALKPHOS 107 07/12/2020 0757   ALKPHOS 129 08/08/2010 0000   BILITOT 0.3 07/12/2020 0757   BILITOT <0.2 07/14/2018 1609   BILITOT 0.4 08/08/2010 0000   GFRNONAA >60 05/01/2020 0538   GFRAA >60 02/24/2020 0821     No results found.     Assessment & Plan:   1. Venous stasis ulcer of other part of left lower leg limited to breakdown of skin without varicose veins (HCC) I discussion with the patient and wife in regards to lower extremity edema and how it relates to wounds and ulcerations.  I believe that the patient's wound is not healing due to not having a sufficient compression to allow his wound to heal.  Certainly with his open ulceration compression socks will be difficult to wear.  However the patient also has severe neuropathy which makes even  minor touch painful.  We discussed Unna wraps and how the compression provided by these wraps, in addition to the zinc oxide would be very helpful in healing his wounds, however the patient refused to have them placed today citing that he did not believe they could be tolerated.  The patient also notes that he recently began any fluid pills and  previously the swelling was improved with fluid pills.  He would like to see if that will improve his lower extremity edema.  We will have the patient return in 4 weeks to reevaluate his lower extremity swelling in setting of beginning diuretics.  We will also refer the patient to the wound center, as they may have additional topical treatments that may be more tolerable for the patient.  The patient is instructed to continue with treatment and bandaging as they have been doing previously. - Ambulatory referral to Wound Clinic  2. PAD (peripheral artery disease) ([place]) Currently the patient does have evidence of PAD however it is mild.  The patient should have adequate perfusion to heal his superficial ulcerations.  Undergoing angiogram may not provide a great deal of benefit in regards to his wound healing, in fact it may worsen wound healing if the patient experiences reperfusion swelling on top of his current edema.  We will maintain close follow-up with his perfusion as his wound continues to heal.  3. Primary hypertension Continue antihypertensive medications as already ordered, these medications have been reviewed and there are no changes at this time.   4. Controlled type 2 diabetes mellitus with complication, without long-term current use of insulin (HCC) Continue hypoglycemic medications as already ordered, these medications have been reviewed and there are no changes at this time.  Hgb A1C to be monitored as already arranged by primary service    Current Outpatient Medications on File Prior to Visit  Medication Sig Dispense Refill  . albuterol (PROAIR HFA) 108 (90 Base) MCG/ACT inhaler Inhale 2 puffs into the lungs every 6 (six) hours as needed for wheezing or shortness of breath. 18 g 3  . ASPIRIN 81 PO Take 1 tablet by mouth daily.    Marland Kitchen azelastine (ASTELIN) 0.1 % nasal spray Place 1 spray into both nostrils 2 (two) times daily. Use in each nostril as directed 30 mL 12  . furosemide  (LASIX) 40 MG tablet Take 1 tablet (40 mg total) by mouth daily. For 5 days then as needed 30 tablet 1  . gabapentin (NEURONTIN) 300 MG capsule Take 300 mg by mouth 3 (three) times daily. (Take with 600 mg for total of 900 mg three times daily)    . HYDROcodone-acetaminophen (NORCO/VICODIN) 5-325 MG tablet Take 1 tablet by mouth every 6 (six) hours as needed for moderate pain. 90 tablet 0  . lisinopril (ZESTRIL) 10 MG tablet TAKE 1 TABLET BY MOUTH EVERY EVENING 90 tablet 0  . metoprolol tartrate (LOPRESSOR) 25 MG tablet TAKE 1/2 TABLET BY MOUTH TWICE DAILY 90 tablet 0  . Multiple Vitamin (MULTIVITAMIN WITH MINERALS) TABS tablet Take 1 tablet by mouth every evening.    . NON FORMULARY Take 500 mg by mouth daily. Hemp Oil 500 mg    . omeprazole (PRILOSEC) 20 MG capsule Take 20 mg by mouth every morning.     . OXYGEN Inhale 2 L into the lungs daily. Using 4 L    . oxymetazoline (AFRIN) 0.05 % nasal spray Place 1 spray into both nostrils 2 (two) times daily. As needed    .  polyethylene glycol (MIRALAX / GLYCOLAX) packet Take 17 g by mouth daily.    . primidone (MYSOLINE) 50 MG tablet Take 2 tablets (100 mg total) by mouth 2 (two) times daily.    . vitamin B-12 (CYANOCOBALAMIN) 100 MCG tablet Take 100 mcg by mouth every evening.    . vitamin E 400 UNIT capsule Take 400 Units by mouth every evening.      No current facility-administered medications on file prior to visit.    There are no Patient Instructions on file for this visit. No follow-ups on file.   Kris Hartmann, NP

## 2020-10-03 ENCOUNTER — Telehealth: Payer: Self-pay

## 2020-10-03 ENCOUNTER — Ambulatory Visit
Admission: RE | Admit: 2020-10-03 | Discharge: 2020-10-03 | Disposition: A | Discharge status: Home / Self Care | Payer: Medicare Other | Source: Ambulatory Visit | Attending: Radiation Oncology | Admitting: Radiation Oncology

## 2020-10-03 ENCOUNTER — Encounter: Payer: Self-pay | Admitting: Family Medicine

## 2020-10-03 ENCOUNTER — Other Ambulatory Visit: Payer: Self-pay

## 2020-10-03 DIAGNOSIS — C3411 Malignant neoplasm of upper lobe, right bronchus or lung: Secondary | ICD-10-CM

## 2020-10-03 DIAGNOSIS — I2510 Atherosclerotic heart disease of native coronary artery without angina pectoris: Secondary | ICD-10-CM | POA: Diagnosis not present

## 2020-10-03 DIAGNOSIS — Z85118 Personal history of other malignant neoplasm of bronchus and lung: Secondary | ICD-10-CM | POA: Diagnosis not present

## 2020-10-03 DIAGNOSIS — J984 Other disorders of lung: Secondary | ICD-10-CM | POA: Diagnosis not present

## 2020-10-03 DIAGNOSIS — J438 Other emphysema: Secondary | ICD-10-CM | POA: Diagnosis not present

## 2020-10-03 LAB — POCT I-STAT CREATININE: Creatinine, Ser: 0.6 mg/dL — ABNORMAL LOW (ref 0.61–1.24)

## 2020-10-03 MED ORDER — IOHEXOL 300 MG/ML  SOLN
75.0000 mL | Freq: Once | INTRAMUSCULAR | Status: AC | PRN
  Administered 2020-10-03: 75 mL via INTRAVENOUS

## 2020-10-03 NOTE — Telephone Encounter (Signed)
Spoke with [facility] to try and cancel 90-day rx and have them fill 30-day rx.  However, pt has already picked it up.  Left message on vm per dpr relaying Dr. Synthia Innocent message and notifying pt is was Dr. Synthia Innocent intention for pt to only have 30-day supply.

## 2020-10-03 NOTE — Telephone Encounter (Signed)
Pt calling stating his wife is having a surgical procedure this week that Dr. Darnell Level is aware of.   So pt is requesting to change his 1 wk f/u on 4/29 to a video visit.  Also, he got my message about the potassium rx.  However, he says he didn't have the potassium during the 5 days he was taking the Lasix.  He's asking if that is ok.  If not, what should he do?  Plz advise.

## 2020-10-04 NOTE — Telephone Encounter (Addendum)
Let's just reschedule appt to next week.  Take potassium once daily for 3 days and that should suffice. Also take when he takes lasix in the future.

## 2020-10-04 NOTE — Telephone Encounter (Signed)
Spoke with pt relaying Dr. Synthia Innocent message.   Pt verbalizes understanding.  However, still wants to r/s 10/07/20 OV as a video visit due to trouble with mobilization.  Is that ok?

## 2020-10-04 NOTE — Telephone Encounter (Signed)
Recommend visit to be in-person as we may place unna boot depending on how wound looks.

## 2020-10-05 ENCOUNTER — Encounter: Payer: Self-pay | Admitting: Radiation Oncology

## 2020-10-05 ENCOUNTER — Other Ambulatory Visit: Payer: Self-pay

## 2020-10-05 ENCOUNTER — Inpatient Hospital Stay (HOSPITAL_BASED_OUTPATIENT_CLINIC_OR_DEPARTMENT_OTHER): Payer: Medicare Other | Admitting: Hospice and Palliative Medicine

## 2020-10-05 ENCOUNTER — Encounter: Payer: Self-pay | Admitting: Internal Medicine

## 2020-10-05 ENCOUNTER — Encounter: Payer: Self-pay | Admitting: Family Medicine

## 2020-10-05 ENCOUNTER — Inpatient Hospital Stay: Payer: Medicare Other | Attending: Internal Medicine | Admitting: Internal Medicine

## 2020-10-05 DIAGNOSIS — C3411 Malignant neoplasm of upper lobe, right bronchus or lung: Secondary | ICD-10-CM | POA: Diagnosis not present

## 2020-10-05 DIAGNOSIS — G893 Neoplasm related pain (acute) (chronic): Secondary | ICD-10-CM

## 2020-10-05 DIAGNOSIS — Z515 Encounter for palliative care: Secondary | ICD-10-CM

## 2020-10-05 MED ORDER — FENTANYL 12 MCG/HR TD PT72: 1.0000 | MEDICATED_PATCH | TRANSDERMAL | 0 refills | Status: DC

## 2020-10-05 MED ORDER — HYDROCODONE-ACETAMINOPHEN 5-325 MG PO TABS: 1.0000 | ORAL_TABLET | Freq: Four times a day (QID) | ORAL | 0 refills | Status: DC | PRN

## 2020-10-05 MED ORDER — NALOXONE HCL 4 MG/0.1ML NA LIQD: NASAL | 0 refills | Status: DC

## 2020-10-05 NOTE — Progress Notes (Signed)
Virtual Visit via Telephone Note  I connected with Joseph Graham on 10/05/20 at  2:00 PM EDT by telephone and verified that I am speaking with the correct person using two identifiers.  Location: Patient: home Provider: clinic   I discussed the limitations, risks, security and privacy concerns of performing an evaluation and management service by telephone and the availability of in person appointments. I also discussed with the patient that there may be a patient responsible charge related to this service. The patient expressed understanding and agreed to proceed.   History of Present Illness: Joseph Graham is a 80 year old man with multiple medical problems including O2 dependent COPD, CAD, PAD, peripheral neuropathy, and lumbar stenosis with chronic back pain.  Additionally, patient has stage II lung cancer with CT of the chest on 02/24/2020 revealing interval growth and pulmonary nodules.    Patient is currently receiving XRT.  However, he has had difficulty tolerating XRT treatments due to persistent back pain. He is referred to palliative care to address goals and manage ongoing symptoms.   Observations/Objective: Patient reports that back pain is significantly improved with use of the Lidoderm. However, patient continues to have neuropathy and takes the Norco multiple times per day for relief. Patient asked about trying the fentanyl patches again, as he felt good pain control when he was on the TD fentanyl. Okay to restart. Will refill Norco but hopefully, patient will require less Norco dosing after fentanyl is restarted.   Patient says that his wife is still in the hospital.  She was assisting him with care.  We discussed increased resources at home but he felt okay for now.  He says that his daughters are available to assist if needed.  Patient spoke with Dr. Rogue Bussing regarding CT results.  Patient says that he fully expected "stage IV" disease.  He says that if that occurs, he  likely will just focus on comfort and quality of life.  Assessment and Plan: Lung cancer -s/p XRT.  Now on surveillance. CT 4/25 revealed slight progressive nodal disease with recommendation for follow-up CT in 3 months. Patient followed by Dr. Rogue Bussing  Chronic low back pain -continue Norco 10-357m (half to 1 tablet) Q6H PRN #90.  Restart fentanyl 139m Q72H #5 patches. Refill naloxone kit. Continue Lidoderm 5% patches to back.   Follow Up Instructions: Follow-up MyChart visit with me in 1 month   I discussed the assessment and treatment plan with the patient. The patient was provided an opportunity to ask questions and all were answered. The patient agreed with the plan and demonstrated an understanding of the instructions.   The patient was advised to call back or seek an in-person evaluation if the symptoms worsen or if the condition fails to improve as anticipated.  I provided 15 minutes of non-face-to-face time during this encounter.   JOIrean HongNP

## 2020-10-05 NOTE — Progress Notes (Signed)
I connected with Joseph Graham on 10/05/2020 at  1:45 PM EDT by video enabled telemedicine visit and verified that I am speaking with the correct person using two identifiers.  I discussed the limitations, risks, security and privacy concerns of performing an evaluation and management service by telemedicine and the availability of in-person appointments. I also discussed with the patient that there may be a patient responsible charge related to this service. The patient expressed understanding and agreed to proceed.    Other persons participating in the visit and their role in the encounter: RN/medical reconciliation Patient's location: home Provider's location: office  Oncology History Overview Note   # OCT 2016- RUL POORLY DIFF CA [clinically non-small cell] ; STAGE IIB [T3-2 separate nodules in RUL; N0] [s/p CT guided bx]- s/p RT [finished mid jan 2017]; FEB 2017-CT scan- regression of RUL nodules;   # March 2017- Start carbo-taxol q 3 w x4; June 2017- CT- Improvement of RUL nodule/ Stable RUL nodules. OCT 2017- PR   # OCT 2016- 2 sub cm nodules in Right LL- Feb CT 2017- STABLE; June 2017- resolved  # May- 2017- G2 PN  # OCT 2016-NON-invasive bladder urothelial cancer; high grade [Dr. [name]; s/p TURBT [Feb 2017- NEG]; declined BCG  #   # Pneumothorax [oct 2016 s/p CT Bx];  # MOLECULAR STUDIES- PDL-1 by IHC/keytruda- 11-20%.  -----------------------------------------    DIAGNOSIS: RUL Lung cancer  STAGE:   II ; GOALS: control  CURRENT/MOST RECENT THERAPY: Surveillance      Malignant neoplasm of right upper lobe of lung ([place])  12/20/2015 Initial Diagnosis   Malignant neoplasm of right upper lobe of lung [facility])      Chief Complaint: Lung cancer   History of present illness:Joseph Graham 80 y.o.  male with history of lung cancer with local recurrence s/p SBRT right lower lobe December 2021 is here for follow-up/review results of the CT scan.  Patient continues to  struggle with worsening shortness of breath/cough.  Has not had any hospitalizations for pneumothorax.  Continues to have chronic pain from his neuropathy.  Denies any headaches nausea vomiting.  Observation/objective: Alert & oriented x 3. In No acute distress.   Assessment and plan: Malignant neoplasm of right upper lobe of lung ([place]) # Right UL POORLY DIFF CA- T3 N0  Currently status post radiation [finished feb 2017];  Finished adjuvant chemotherapy [end of April 2017]; April 2022-CT scan stable  # RLL-1.5cm-peripheral continues to grow over the last few years.- s/p SBRT [finished Dec 15th, 2021]-CT scan April 2022-extensive radiation changes along the right lower lobe.   #Right lower lobe lung nodule/subcarinal lymph node progressive-question for recurrent malignancy-recommend a PET scan in 3 months for further evaluation.  #Spontaneous pneumothorax x2 status post pleurodesis-currently clinically- STABLE.    # Peripheral neuropathy/back pain-STABLE; on hydrocodone/oxycodone- defer to [facility] for pain management. Also continue Neurontin.  Discussed with Joseph Graham  # COPD on O2/nasal cannula-STABLE  # Bladder cancer- superficial- STABLE  #Prognosis/treatment plan-patient given his multiple comorbidities/poor respiratory status-is not too keen on any further therapy given the subtle changes noted on the imaging.  I think/and agree that patient is a poor candidate for systemic therapy and at extremely high risk of complications of treatment.  However is interested in follow-up with scans.   # DISPOSITION:  # Follow up in 3 months- MD-virtual; PET scan prior-Dr.B  # I reviewed the blood work- with the patient in detail; also reviewed the imaging independently [as summarized above]; and with  the patient in detail.       Follow-up instructions:  I discussed the assessment and treatment plan with the patient.  The patient was provided an opportunity to ask questions and all were answered.   The patient agreed with the plan and demonstrated understanding of instructions.  The patient was advised to call back or seek an in person evaluation if the symptoms worsen or if the condition fails to improve as anticipated.   Dr. Charlaine Dalton [place] [facility] 10/06/2020 7:40 AM

## 2020-10-05 NOTE — Telephone Encounter (Signed)
Patient notified as instructed by telephone and verbalized understanding. Patient stated that he saw his vascular doctor last week and they discussed a Unna boot and it was decided that he would not benefit from one. Patient stated that the  circulation in his legs are fine. Patient stated that his wife is having a heart procedure now and she is his driver. Patient stated that he has upcoming doctor appointments and it seems that everyone wants to poke him. Patient stated not to schedule the appointment with Dr. Danise Mina this week.  Patient stated that if Dr. Danise Mina needs to look at his leg he can show it on a video visit.  Patient stated that he will call back and schedule an appointment when he feels like he needs to see him.

## 2020-10-05 NOTE — Assessment & Plan Note (Addendum)
#   Right UL POORLY DIFF CA- T3 N0  Currently status post radiation [finished feb 2017];  Finished adjuvant chemotherapy [end of April 2017]; April 2022-CT scan stable  # RLL-1.5cm-peripheral continues to grow over the last few years.- s/p SBRT [finished Dec 15th, 2021]-CT scan April 2022-extensive radiation changes along the right lower lobe.   #Right lower lobe lung nodule/subcarinal lymph node progressive-question for recurrent malignancy-recommend a PET scan in 3 months for further evaluation.  #Spontaneous pneumothorax x2 status post pleurodesis-currently clinically- STABLE.    # Peripheral neuropathy/back pain-STABLE; on hydrocodone/oxycodone- defer to [facility] for pain management. Also continue Neurontin.  Discussed with [name]  # COPD on O2/nasal cannula-STABLE  # Bladder cancer- superficial- STABLE  #Prognosis/treatment plan-patient given his multiple comorbidities/poor respiratory status-is not too keen on any further therapy given the subtle changes noted on the imaging.  I think/and agree that patient is a poor candidate for systemic therapy and at extremely high risk of complications of treatment.  However is interested in follow-up with scans.   # DISPOSITION:  # Follow up in 3 months- MD-virtual; PET scan prior-Dr.B  # I reviewed the blood work- with the patient in detail; also reviewed the imaging independently [as summarized above]; and with the patient in detail.

## 2020-10-06 ENCOUNTER — Ambulatory Visit: Payer: Medicare Other | Admitting: Radiation Oncology

## 2020-10-06 NOTE — Telephone Encounter (Signed)
Spoke with pt relaying Dr. Synthia Innocent message pt verbalizes understanding and will f/u with wound care.  He agrees to cancel 4/29 and 5/3 appts.  And will keep MyChart video visit on 5/9 for 3 mo f/u.

## 2020-10-06 NOTE — Telephone Encounter (Signed)
Noted  

## 2020-10-06 NOTE — Telephone Encounter (Signed)
See phone note

## 2020-10-06 NOTE — Telephone Encounter (Signed)
I was unaware he saw vascular NP as I had just ordered ABIs.  Note reviewed. It looks like they referred him to wound care and recommended 1 month f/u with them.  As long as wound is healing with treatment plan and no signs of infection ok to cancel upcoming appointments. Would want him to keep wound clinic appt ordered by vascular provider.  He has 2 upcoming appts with [place] in the next few weeks - would clarify if he's cancelling or planning to keep.

## 2020-10-06 NOTE — Telephone Encounter (Signed)
See recent phone note 

## 2020-10-07 ENCOUNTER — Ambulatory Visit: Payer: Medicare Other | Admitting: Family Medicine

## 2020-10-11 ENCOUNTER — Ambulatory Visit: Payer: Medicare Other | Admitting: Family Medicine

## 2020-10-13 ENCOUNTER — Telehealth: Payer: Self-pay

## 2020-10-13 NOTE — Telephone Encounter (Addendum)
Messaged [facility] wound care office through referral [place] and asked to reach out to the patient.

## 2020-10-13 NOTE — Telephone Encounter (Signed)
Referral was placed by [facility] last week (see referral in chart). Can we call [facility] referral and notify them pt is ready to schedule? They were supposed to call him back this week to schedule this.  Let me know if I need to place a new referral.

## 2020-10-13 NOTE — Telephone Encounter (Signed)
Pt reports he is needing a referral for wound care for the ulcer on his chin and states you have discussed in the past

## 2020-10-14 ENCOUNTER — Telehealth: Payer: Self-pay

## 2020-10-14 NOTE — Chronic Care Management (AMB) (Addendum)
Chronic Care Management Pharmacy Assistant   Name: Joseph Graham  MRN: [id number] DOB: April 02, 1941  Reason for Encounter: Disease State HTN  DM   Conditions to be addressed/monitored: HTN and DMII  Recent office visits:  10/17/2020  Dr.Gutierrez PCP - Continue decreased Gabapentin 300mg  to 1 tablet 3 times a day. 09/28/2020  Dr.Gutierrez  PCP  - Increased dose of lasix to 40mg  daily for 5 days and added potassium chloride 63meq 1 tablet daily while on Lasix. Decreased gabapentin dose to 900mg  three times a day.   09/20/2020  Webb Silversmith NP  - Added Furosemide 20mg  daily for 5 days. Labs ordered.  Recent consult visits:  10/20/2020  [facility] visits:  None in previous 6 months  Medications: Outpatient Encounter Medications as of 10/14/2020  Medication Sig   albuterol (PROAIR HFA) 108 (90 Base) MCG/ACT inhaler Inhale 2 puffs into the lungs every 6 (six) hours as needed for wheezing or shortness of breath.   ASPIRIN 81 PO Take 1 tablet by mouth daily.   azelastine (ASTELIN) 0.1 % nasal spray Place 1 spray into both nostrils 2 (two) times daily. Use in each nostril as directed   fentaNYL (DURAGESIC) 12 MCG/HR Place 1 patch onto the skin every 3 (three) days.   furosemide (LASIX) 40 MG tablet Take 1 tablet (40 mg total) by mouth daily. For 5 days then as needed   gabapentin (NEURONTIN) 300 MG capsule Take 300 mg by mouth 3 (three) times daily. (Take with 600 mg for total of 900 mg three times daily)   HYDROcodone-acetaminophen (NORCO/VICODIN) 5-325 MG tablet Take 1 tablet by mouth every 6 (six) hours as needed for moderate pain.   lisinopril (ZESTRIL) 10 MG tablet TAKE 1 TABLET BY MOUTH EVERY EVENING   metoprolol tartrate (LOPRESSOR) 25 MG tablet TAKE 1/2 TABLET BY MOUTH TWICE DAILY   Multiple Vitamin (MULTIVITAMIN WITH MINERALS) TABS tablet Take 1 tablet by mouth every evening.   naloxone (NARCAN) nasal spray 4 mg/0.1 mL SPRAY 1 SPRAY INTO ONE NOSTRIL AS  DIRECTED FOR OPIOID OVERDOSE (TURN PERSON ON SIDE AFTER DOSE. IF NO RESPONSE IN 2-3 MINUTES OR PERSON RESPONDS BUT RELAPSES, REPEAT USING A NEW SPRAY DEVICE AND SPRAY INTO THE OTHER NOSTRIL. CALL 911 AFTER USE.) * EMERGENCY USE ONLY *   NON FORMULARY Take 500 mg by mouth daily. Hemp Oil 500 mg   omeprazole (PRILOSEC) 20 MG capsule Take 20 mg by mouth every morning.    OXYGEN Inhale 2 L into the lungs daily. Using 4 L   oxymetazoline (AFRIN) 0.05 % nasal spray Place 1 spray into both nostrils 2 (two) times daily. As needed   polyethylene glycol (MIRALAX / GLYCOLAX) packet Take 17 g by mouth daily.   potassium chloride (KLOR-CON) 10 MEQ tablet TAKE 1 TABLET BY MOUTH DAILY WHILE ON LASIX   primidone (MYSOLINE) 50 MG tablet Take 2 tablets (100 mg total) by mouth 2 (two) times daily.   vitamin B-12 (CYANOCOBALAMIN) 100 MCG tablet Take 100 mcg by mouth every evening.   vitamin E 400 UNIT capsule Take 400 Units by mouth every evening.    No facility-administered encounter medications on file as of 10/14/2020.   Recent Relevant Labs: Lab Results  Component Value Date/Time   HGBA1C 6.6 (H) 07/12/2020 07:57 AM   HGBA1C 6.2 07/10/2019 08:10 AM    Kidney Function Lab Results  Component Value Date/Time   CREATININE 0.60 (L) 10/03/2020 09:39 AM   CREATININE 0.51 07/12/2020  07:57 AM   CREATININE 0.68 08/08/2010 12:00 AM   GFR 96.08 07/12/2020 07:57 AM   GFRNONAA >60 05/01/2020 05:38 AM   GFRAA >60 02/24/2020 08:21 AM   Diabetes  Current antihyperglycemic regimen: No pharmacotherapy  What recent interventions/DTPs have been made to improve glycemic control: the patient states he avoids adding sugar to food and drink   Have there been any recent hospitalizations or ED visits since last visit with CPP? No  The patient reports he has not had any hospital visits  Patient denies hypoglycemic symptoms, including Pale, Sweaty, Shaky, Hungry, Nervous/irritable and Vision changes  Patient denies  hyperglycemic symptoms, including blurry vision, excessive thirst, fatigue, polyuria and weakness  How often are you checking your blood sugar? The patient reports he does not check his BG at home  On insulin? No  During the week, how often does your blood glucose drop below 70? Never The patient states his last A1C was 6.6% and was good and does not check his BG's at home  Are you checking your feet daily/regularly? Yes the patient wears a slip on shoe that is very easy to remove and looks at his feet daily   Adherence Review: Is the patient currently on a STATIN medication? No Is the patient currently on ACE/ARB medication? Yes Does the patient have >5 day gap between last estimated fill dates? No  Star Rating Drugs:  Medication:  Last Fill: Day Supply Lisinopril 10mg  09/15/2020 90  Hypertension  Recent Office Vitals: BP Readings from Last 3 Encounters:  09/30/20 (!) 131/45  09/28/20 (!) 140/58  09/20/20 134/64   Pulse Readings from Last 3 Encounters:  09/30/20 67  09/28/20 68  09/20/20 65    Wt Readings from Last 3 Encounters:  10/17/20 178 lb (80.7 kg)  09/30/20 180 lb (81.6 kg)  07/04/20 180 lb (81.6 kg)     Kidney Function Lab Results  Component Value Date/Time   CREATININE 0.60 (L) 10/03/2020 09:39 AM   CREATININE 0.51 07/12/2020 07:57 AM   CREATININE 0.68 08/08/2010 12:00 AM   GFR 96.08 07/12/2020 07:57 AM   GFRNONAA >60 05/01/2020 05:38 AM   GFRAA >60 02/24/2020 08:21 AM    BMP Latest Ref Rng & Units 10/03/2020 07/12/2020 05/01/2020  Glucose 70 - 99 mg/dL - 114(H) 116(H)  BUN 6 - 23 mg/dL - 23 22  Creatinine 0.61 - 1.24 mg/dL 0.60(L) 0.51 0.54(L)  BUN/Creat Ratio 10 - 24 - - -  Sodium 135 - 145 mEq/L - 138 142  Potassium 3.5 - 5.1 mEq/L - 4.3 4.4  Chloride 96 - 112 mEq/L - 94(L) 91(L)  CO2 19 - 32 mEq/L - 39(H) 39(H)  Calcium 8.4 - 10.5 mg/dL - 9.4 8.6(L)   Current antihypertensive regimen:  Lisinopril 10mg  - take one tablet in the evening Metoprolol  tartrate 25 mg - 1/2 tablet twice daily  Patient verbally confirms he is taking the above medications as directed. Yes he states that the medications are correct   Current home BP readings: The patient states his BP's are all over the place 135/65 is average.The patient says he has 2 machines at home to use but often has trouble getting them to work.  Salt intake: The patient reports he is not on a salt free diet however he limits his daily intake. OTC medications including pseudoephedrine or NSAIDs?No  Any readings above 180/120? No the patient states he does not have episodes where he has concern of BP running too high.  What recent  interventions/DTPs have been made by any provider to improve Blood Pressure control since last CPP Visit:  09/28/2020 Dr.Gutierrez added Lasix 40mg . Take one tablet a day for 5 days then prn and Klor-Con 10 meq take 1 tablet daily while on lasix  Any recent hospitalizations or ED visits since last visit with CPP? No  What diet changes have been made to improve Blood Pressure Control? The patient reports he limits salt intake.  What exercise is being done to improve your Blood Pressure Control? 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   Follow-Up:  Pharmacist Review  Debbora Dus, CPP notified  Avel Sensor, [place] Assistant [phone]  I have reviewed the care management and care coordination activities outlined in this encounter and I am certifying that I agree with the content of this note. No further action required.  Debbora Dus, PharmD Clinical Pharmacist [facility] at [facility] [phone]

## 2020-10-14 NOTE — Telephone Encounter (Signed)
Patient advised that [facility] office already put in referral for this and that I sent them a message to reach back out to the patient. I also provided [place] office phone number to the patient to call them also. Patient verbalized understanding.

## 2020-10-17 ENCOUNTER — Other Ambulatory Visit: Payer: Self-pay

## 2020-10-17 ENCOUNTER — Encounter: Payer: Self-pay | Admitting: Family Medicine

## 2020-10-17 ENCOUNTER — Telehealth (INDEPENDENT_AMBULATORY_CARE_PROVIDER_SITE_OTHER): Payer: Medicare Other | Admitting: Family Medicine

## 2020-10-17 VITALS — Ht 68.0 in | Wt 178.0 lb

## 2020-10-17 DIAGNOSIS — T451X5A Adverse effect of antineoplastic and immunosuppressive drugs, initial encounter: Secondary | ICD-10-CM | POA: Diagnosis not present

## 2020-10-17 DIAGNOSIS — J449 Chronic obstructive pulmonary disease, unspecified: Secondary | ICD-10-CM

## 2020-10-17 DIAGNOSIS — J9611 Chronic respiratory failure with hypoxia: Secondary | ICD-10-CM

## 2020-10-17 DIAGNOSIS — R600 Localized edema: Secondary | ICD-10-CM | POA: Diagnosis not present

## 2020-10-17 DIAGNOSIS — I739 Peripheral vascular disease, unspecified: Secondary | ICD-10-CM | POA: Diagnosis not present

## 2020-10-17 DIAGNOSIS — G894 Chronic pain syndrome: Secondary | ICD-10-CM | POA: Diagnosis not present

## 2020-10-17 DIAGNOSIS — L97821 Non-pressure chronic ulcer of other part of left lower leg limited to breakdown of skin: Secondary | ICD-10-CM | POA: Diagnosis not present

## 2020-10-17 DIAGNOSIS — I872 Venous insufficiency (chronic) (peripheral): Secondary | ICD-10-CM

## 2020-10-17 DIAGNOSIS — G620 Drug-induced polyneuropathy: Secondary | ICD-10-CM

## 2020-10-17 NOTE — Assessment & Plan Note (Signed)
Has declined compression wraps/unna boot due to neuropathy pain. Overall improved, gabapentin dose has been reduced. Discussed lasix dosing.

## 2020-10-17 NOTE — Assessment & Plan Note (Signed)
Overall doing better since starting fentanyl and restarting lidoderm patches. Appreciate palliative care assistance.

## 2020-10-17 NOTE — Assessment & Plan Note (Signed)
Mild by latest ABI.

## 2020-10-17 NOTE — Assessment & Plan Note (Addendum)
Severe oxygen dependent COPD.  Not on respiratory medications - didn't feel they helped.  Limited mobility, ambulation  due to this. Desires virtual visits as much as possible.

## 2020-10-17 NOTE — Assessment & Plan Note (Addendum)
He continues low dose gabapentin.  Lowering the dose may have helped improve pedal edema.

## 2020-10-17 NOTE — Assessment & Plan Note (Signed)
Pt endorses this is improving - will continue home treatment to date.  Upcoming wound care clinic appointment later this week.

## 2020-10-17 NOTE — Progress Notes (Signed)
Patient ID: Joseph Graham, male    DOB: March 08, 1941, 80 y.o.   MRN: [id number]  Virtual visit completed through [place], a video enabled telemedicine application. Due to national recommendations of social distancing due to COVID-19, a virtual visit is felt to be most appropriate for this patient at this time. Reviewed limitations, risks, security and privacy concerns of performing a virtual visit and the availability of in person appointments. I also reviewed that there may be a patient responsible charge related to this service. The patient agreed to proceed.   Patient location: home Provider location: [place] at [facility], office Persons participating in this virtual visit: patient, provider   If any vitals were documented, they were collected by patient at home unless specified below.    Ht 5\' 8"  (1.727 m)   Wt 178 lb (80.7 kg)   BMI 27.06 kg/m    CC: 3 mo fu/ visit  Subjective:   HPI: Joseph Graham is a 80 y.o. male presenting on 10/17/2020 for Follow-up (3 mo f/u.)   See prior note for details.  Seen here last month with progressive pedal edema associated with large blistering wound to L shin. [facility] - ABIs showed 0.82 on right and 0.87 on left with good toe waveforms (09/30/2020), referred to wound clinic - has not seen yet but has appt scheduled for later this week. Feels wound is slowly getting smaller. Changing vaseline dressing twice daily. Yesterday he left bandage off and feels wound is scabbing over. No streaking redness or draining pus. Swelling is some better but hasn't completely resolved.   Known lung cancer s/p XRT now on surveillance.  Sees palliative care provider through cancer center.  Ongoing chronic lower back pain - lidoderm patches have helped. For ongoing chemo-induced neuropathy, takes gabapentin 300mg  TID (decreased dose), norco 10/325mg  1/2-1 tab Q6PRN, recently restarted fentanyl 53mcg patches.   Hospitalization x2 last year for PTX in  setting of severe COPD.  Of Crestor worried it caused ankle swelling.     Relevant past medical, surgical, family and social history reviewed and updated as indicated. Interim medical history since our last visit reviewed. Allergies and medications reviewed and updated. Outpatient Medications Prior to Visit  Medication Sig Dispense Refill  . albuterol (PROAIR HFA) 108 (90 Base) MCG/ACT inhaler Inhale 2 puffs into the lungs every 6 (six) hours as needed for wheezing or shortness of breath. 18 g 3  . ASPIRIN 81 PO Take 1 tablet by mouth daily.    [place] Kitchen azelastine (ASTELIN) 0.1 % nasal spray Place 1 spray into both nostrils 2 (two) times daily. Use in each nostril as directed 30 mL 12  . fentaNYL (DURAGESIC) 12 MCG/HR Place 1 patch onto the skin every 3 (three) days. 5 patch 0  . furosemide (LASIX) 40 MG tablet Take 1 tablet (40 mg total) by mouth daily. For 5 days then as needed 30 tablet 1  . HYDROcodone-acetaminophen (NORCO/VICODIN) 5-325 MG tablet Take 1 tablet by mouth every 6 (six) hours as needed for moderate pain. 90 tablet 0  . lisinopril (ZESTRIL) 10 MG tablet TAKE 1 TABLET BY MOUTH EVERY EVENING 90 tablet 0  . metoprolol tartrate (LOPRESSOR) 25 MG tablet TAKE 1/2 TABLET BY MOUTH TWICE DAILY 90 tablet 0  . Multiple Vitamin (MULTIVITAMIN WITH MINERALS) TABS tablet Take 1 tablet by mouth every evening.    . naloxone (NARCAN) nasal spray 4 mg/0.1 mL SPRAY 1 SPRAY INTO ONE NOSTRIL AS DIRECTED FOR OPIOID OVERDOSE (TURN  PERSON ON SIDE AFTER DOSE. IF NO RESPONSE IN 2-3 MINUTES OR PERSON RESPONDS BUT RELAPSES, REPEAT USING A NEW SPRAY DEVICE AND SPRAY INTO THE OTHER NOSTRIL. CALL 911 AFTER USE.) * EMERGENCY USE ONLY * 1 each 0  . NON FORMULARY Take 500 mg by mouth daily. Hemp Oil 500 mg    . omeprazole (PRILOSEC) 20 MG capsule Take 20 mg by mouth every morning.     . OXYGEN Inhale 2 L into the lungs daily. Using 4 L    . oxymetazoline (AFRIN) 0.05 % nasal spray Place 1 spray into both nostrils 2  (two) times daily. As needed    . polyethylene glycol (MIRALAX / GLYCOLAX) packet Take 17 g by mouth daily.    . potassium chloride (KLOR-CON) 10 MEQ tablet TAKE 1 TABLET BY MOUTH DAILY WHILE ON LASIX 90 tablet 0  . primidone (MYSOLINE) 50 MG tablet Take 2 tablets (100 mg total) by mouth 2 (two) times daily.    . vitamin B-12 (CYANOCOBALAMIN) 100 MCG tablet Take 100 mcg by mouth every evening.    . vitamin E 400 UNIT capsule Take 400 Units by mouth every evening.     . gabapentin (NEURONTIN) 300 MG capsule Take 300 mg by mouth 3 (three) times daily. (Take with 600 mg for total of 900 mg three times daily)    . gabapentin (NEURONTIN) 300 MG capsule Take 1 capsule (300 mg total) by mouth 3 (three) times daily.     No facility-administered medications prior to visit.     Per HPI unless specifically indicated in ROS section below Review of Systems Objective:  Ht 5\' 8"  (1.727 m)   Wt 178 lb (80.7 kg)   BMI 27.06 kg/m   Wt Readings from Last 3 Encounters:  10/17/20 178 lb (80.7 kg)  09/30/20 180 lb (81.6 kg)  07/04/20 180 lb (81.6 kg)       Physical exam: Gen: unable to evaluate patient given limitations due to camera Pulm: speaks in complete sentences without increased work of breathing Psych: normal mood, normal thought content      Results for orders placed or performed during the hospital encounter of 10/03/20  I-STAT creatinine  Result Value Ref Range   Creatinine, Ser 0.60 (L) 0.61 - 1.24 mg/dL   Assessment & Plan:   Problem List Items Addressed This Visit    Peripheral neuropathy due to chemotherapy ([place]) (Chronic)    He continues low dose gabapentin.  Lowering the dose may have helped improve pedal edema.       Relevant Medications   gabapentin (NEURONTIN) 300 MG capsule   Chronic pain syndrome (Chronic)    Overall doing better since starting fentanyl and restarting lidoderm patches. Appreciate palliative care assistance.       Relevant Medications   gabapentin  (NEURONTIN) 300 MG capsule   PAD (peripheral artery disease) (HCC)    Mild by latest ABI.       COPD, severe ([place])    Severe oxygen dependent COPD.  Not on respiratory medications - didn't feel they helped.  Limited mobility, ambulation  due to this. Desires virtual visits as much as possible.       Pedal edema    Has declined compression wraps/unna boot due to neuropathy pain. Overall improved, gabapentin dose has been reduced. Discussed lasix dosing.       Chronic respiratory failure with hypoxia (HCC)   Stasis ulcer ([place]) - Primary    Pt endorses this is improving - will  continue home treatment to date.  Upcoming wound care clinic appointment later this week.           No orders of the defined types were placed in this encounter.  No orders of the defined types were placed in this encounter.   I discussed the assessment and treatment plan with the patient. The patient was provided an opportunity to ask questions and all were answered. The patient agreed with the plan and demonstrated an understanding of the instructions. The patient was advised to call back or seek an in-person evaluation if the symptoms worsen or if the condition fails to improve as anticipated.  Follow up plan: No follow-ups on file.  [name], MD

## 2020-10-19 ENCOUNTER — Other Ambulatory Visit: Payer: Self-pay

## 2020-10-19 ENCOUNTER — Encounter: Payer: Medicare Other | Attending: Internal Medicine | Admitting: Internal Medicine

## 2020-10-19 DIAGNOSIS — Z9981 Dependence on supplemental oxygen: Secondary | ICD-10-CM | POA: Insufficient documentation

## 2020-10-19 DIAGNOSIS — J449 Chronic obstructive pulmonary disease, unspecified: Secondary | ICD-10-CM | POA: Diagnosis not present

## 2020-10-19 DIAGNOSIS — E1142 Type 2 diabetes mellitus with diabetic polyneuropathy: Secondary | ICD-10-CM | POA: Diagnosis not present

## 2020-10-19 DIAGNOSIS — I872 Venous insufficiency (chronic) (peripheral): Secondary | ICD-10-CM | POA: Insufficient documentation

## 2020-10-19 DIAGNOSIS — I87332 Chronic venous hypertension (idiopathic) with ulcer and inflammation of left lower extremity: Secondary | ICD-10-CM | POA: Diagnosis not present

## 2020-10-19 DIAGNOSIS — E11622 Type 2 diabetes mellitus with other skin ulcer: Secondary | ICD-10-CM | POA: Insufficient documentation

## 2020-10-19 DIAGNOSIS — I890 Lymphedema, not elsewhere classified: Secondary | ICD-10-CM | POA: Insufficient documentation

## 2020-10-19 DIAGNOSIS — I10 Essential (primary) hypertension: Secondary | ICD-10-CM | POA: Insufficient documentation

## 2020-10-19 DIAGNOSIS — L97821 Non-pressure chronic ulcer of other part of left lower leg limited to breakdown of skin: Secondary | ICD-10-CM | POA: Diagnosis not present

## 2020-10-19 DIAGNOSIS — Z85118 Personal history of other malignant neoplasm of bronchus and lung: Secondary | ICD-10-CM | POA: Diagnosis not present

## 2020-10-20 ENCOUNTER — Ambulatory Visit
Admission: RE | Admit: 2020-10-20 | Discharge: 2020-10-20 | Disposition: A | Discharge status: Home / Self Care | Payer: Medicare Other | Source: Ambulatory Visit | Attending: Radiation Oncology | Admitting: Radiation Oncology

## 2020-10-20 DIAGNOSIS — C3431 Malignant neoplasm of lower lobe, right bronchus or lung: Secondary | ICD-10-CM | POA: Diagnosis not present

## 2020-10-20 NOTE — Progress Notes (Signed)
Radiation Oncology Follow up Note  Name: CAMERYN CHRISLEY   Date:   10/20/2020 MRN:  [id number] DOB: 11-24-1940    This 80 y.o. male presents by telephone for follow-up now at 55-month status post SBRT for right lower lobe lesion previously treated to his right upper lobe in 2019 for poorly differentiated non-small cell lung cancer with 2 separate foci.  Radiation Oncology TeleHEALTH VISIT PROGRESS NOTE  I connected with Rachael Fee   by telephone-[place] and verified that I am speaking with the correct person using two identifiers.  I discussed the limitations, risks, security and privacy concerns of performing an evaluation and management service by telemedicine and the availability of in-person appointments. I also discussed with the patient that there may be a patient responsible charge related to this service. The patient expressed understanding and agreed to proceed.    Other persons participating in the visit and their role in the encounter:    Patient's location: Home   Provider's location: work  REFERRING PROVIDER: [name], MD  HPI: Patient is an 80 year old male now out 4 months having completed SBRT to progressive right lower lobe lesion and patient previously treated to his right upper lobe back in 2019 for poorly differentiated non-small cell lung cancer with 2 separate foci.  He was contacted by phone.  He is doing fairly well overall though on continuous nasal oxygen.  He states his pulmonary functions have declined.  He has a slight cough no hemoptysis or chest tightness..  He had a recent CT scan of his chest showing radiation changes around the right lower lobe nodule making it difficult to identify he has stable right apical lesion without findings for recurrent tumor.  He has slight progressive subcarinal nodal tissue somewhat worrisome although PET CT scan has been ordered for July.  COMPLICATIONS OF TREATMENT: none  FOLLOW UP COMPLIANCE: keeps appointments    PHYSICAL EXAM:  There were no vitals taken for this visit. No exam was performed this was a phone conference  RADIOLOGY RESULTS: CT scans and serial CT scans reviewed compatible with above-stated findings  PLAN: This time patient appears stable with stable findings on his repeat CT scan.  I have asked to see him back in July after his PET CT scan will continue to track those areas of the subcarinal area as well as other pulmonary lesions.  I have suggested a pulmonary consult for the patient although he states he is difficult for him to travel to doctors offices.  He continues follow-up care with medical oncology and Dr. B.  Patient knows to call with any concerns.  I would like to take this opportunity to thank you for allowing me to participate in the care of your patient.Noreene Filbert, MD

## 2020-10-21 NOTE — Progress Notes (Signed)
MACRAE, WIEGMAN ([id number]) Visit Report for 10/19/2020 Allergy List Details Patient Name: Joseph Graham, Joseph Graham. Date of Service: 10/19/2020 10:00 AM Medical Record Number: [id number] Patient Account Number: [id number] Date of Birth/Sex: May 08, 1941 (80 y.o. M) Treating RN: Dolan Amen Primary Care Leora Platt: [name] Other Clinician: Referring Ayanni Tun: Eulogio Ditch Treating [place]en [place]/Extender: Ricard Dillon Weeks in Treatment: 0 Allergies Active Allergies No Known Drug Allergies Allergy Notes Electronic Signature(s) Signed: 10/20/2020 5:06:40 PM By: Georges Mouse, Minus Breeding RN Entered By: Georges Mouse, Minus Breeding on 10/19/2020 10:06:55 Joseph Graham ([id number]) -------------------------------------------------------------------------------- Arrival Information Details Patient Name: Joseph Graham Date of Service: 10/19/2020 10:00 AM Medical Record Number: [id number] Patient Account Number: [id number] Date of Birth/Sex: 01/19/1941 (80 y.o. M) Treating RN: Dolan Amen Primary Care Bryann Gentz: [name] Other Clinician: Referring Jacee Enerson: Eulogio Ditch Treating Patrice Moates/Extender: Tito Dine in Treatment: 0 Visit Information Patient Arrived: Wheel Chair Arrival Time: 09:57 Accompanied By: wife Transfer Assistance: Manual Patient Identification Verified: Yes Secondary Verification Process Completed: Yes Patient Has Alerts: Yes Patient Alerts: Type II Diabetic TBI 09/30/20 L 0.68 R 0.99 Electronic Signature(s) Signed: 10/20/2020 5:06:40 PM By: Georges Mouse, Minus Breeding RN Entered By: Georges Mouse, Minus Breeding on 10/19/2020 10:21:31 Joseph Graham ([id number]) -------------------------------------------------------------------------------- Clinic Level of Care Assessment Details Patient Name: Joseph Graham Date of Service: 10/19/2020 10:00 AM Medical Record Number: [id number] Patient Account Number: [id number] Date of Birth/Sex: 1940-12-04 (80  y.o. M) Treating RN: Cornell Barman Primary Care Kincaid Tiger: [name] Other Clinician: Referring Supreme Rybarczyk: Eulogio Ditch Treating Railyn House/Extender: Tito Dine in Treatment: 0 Clinic Level of Care Assessment Items TOOL 1 Quantity Score []  - Use when EandM and Procedure is performed on INITIAL visit 0 ASSESSMENTS - Nursing Assessment / Reassessment X - General Physical Exam (combine w/ comprehensive assessment (listed just below) when performed on new 1 20 pt. evals) X- 1 25 Comprehensive Assessment (HX, ROS, Risk Assessments, Wounds Hx, etc.) ASSESSMENTS - Wound and Skin Assessment / Reassessment []  - Dermatologic / Skin Assessment (not related to wound area) 0 ASSESSMENTS - Ostomy and/or Continence Assessment and Care []  - Incontinence Assessment and Management 0 []  - 0 Ostomy Care Assessment and Management (repouching, etc.) PROCESS - Coordination of Care X - Simple Patient / Family Education for ongoing care 1 15 []  - 0 Complex (extensive) Patient / Family Education for ongoing care X- 1 10 Staff obtains Programmer, systems, Records, Test Results / Process Orders []  - 0 Staff telephones HHA, Nursing Homes / Clarify orders / etc []  - 0 Routine Transfer to another Facility (non-emergent condition) []  - 0 Routine Hospital Admission (non-emergent condition) X- 1 15 New Admissions / Biomedical engineer / Ordering NPWT, Apligraf, etc. []  - 0 Emergency Hospital Admission (emergent condition) PROCESS - Special Needs []  - Pediatric / Minor Patient Management 0 []  - 0 Isolation Patient Management []  - 0 Hearing / Language / Visual special needs []  - 0 Assessment of Community assistance (transportation, D/C planning, etc.) []  - 0 Additional assistance / Altered mentation []  - 0 Support Surface(s) Assessment (bed, cushion, seat, etc.) INTERVENTIONS - Miscellaneous []  - External ear exam 0 []  - 0 Patient Transfer (multiple staff / Civil Service fast streamer / Similar devices) []   - 0 Simple Staple / Suture removal (25 or less) []  - 0 Complex Staple / Suture removal (26 or more) []  - 0 Hypo/Hyperglycemic Management (do not check if billed separately) []  - 0 Ankle / Brachial Index (ABI) - do not check if billed separately Has the patient been seen at  the hospital within the last three years: Yes Total Score: 85 Level Of Care: New/Established - Level 3 Joseph Graham, Joseph Graham ([id number]) Electronic Signature(s) Signed: 10/19/2020 6:06:01 PM By: Gretta Cool, BSN, RN, CWS, Kim RN, BSN Entered By: Gretta Cool, BSN, RN, CWS, Kim on 10/19/2020 10:58:49 Joseph Graham ([id number]) -------------------------------------------------------------------------------- Compression Therapy Details Patient Name: Joseph Graham Date of Service: 10/19/2020 10:00 AM Medical Record Number: [id number] Patient Account Number: [id number] Date of Birth/Sex: December 14, 1940 (80 y.o. M) Treating RN: Cornell Barman Primary Care Estreya Clay: [name] Other Clinician: Referring Rushil Kimbrell: Eulogio Ditch Treating Janie Strothman/Extender: Tito Dine in Treatment: 0 Compression Therapy Performed for Wound Assessment: NonWound Condition Lymphedema - Bilateral Leg Performed By: Clinician Cornell Barman, RN Compression Type: Three Layer Post Procedure Diagnosis Same as Pre-procedure Notes TBI 09/30/20 L 0.68 R 0.99 Electronic Signature(s) Signed: 10/19/2020 6:06:01 PM By: Gretta Cool, BSN, RN, CWS, Kim RN, BSN Entered By: Gretta Cool, BSN, RN, CWS, Kim on 10/19/2020 10:53:49 Joseph Graham ([id number]) -------------------------------------------------------------------------------- Encounter Discharge Information Details Patient Name: Joseph Graham Date of Service: 10/19/2020 10:00 AM Medical Record Number: [id number] Patient Account Number: [id number] Date of Birth/Sex: 05-May-1941 (80 y.o. M) Treating RN: Cornell Barman Primary Care Imari Sivertsen: [name] Other Clinician: Referring Eiko Mcgowen: Eulogio Ditch Treating Samariah Hokenson/Extender: Tito Dine in Treatment: 0 Encounter Discharge Information Items Discharge Condition: Stable Ambulatory Status: Ambulatory Discharge Destination: Home Transportation: Private Auto Accompanied By: wife Schedule Follow-up Appointment: Yes Clinical Summary of Care: Electronic Signature(s) Signed: 10/19/2020 6:06:01 PM By: Gretta Cool, BSN, RN, CWS, Kim RN, BSN Entered By: Gretta Cool, BSN, RN, CWS, Kim on 10/19/2020 11:04:24 Joseph Graham ([id number]) -------------------------------------------------------------------------------- Lower Extremity Assessment Details Patient Name: Joseph Graham, Joseph Graham. Date of Service: 10/19/2020 10:00 AM Medical Record Number: [id number] Patient Account Number: [id number] Date of Birth/Sex: [birth year] (80 y.o. M) Treating RN: Dolan Amen Primary Care Cori Justus: [name] Other Clinician: Referring Arion Shankles: Eulogio Ditch Treating Thanh Pomerleau/Extender: Tito Dine in Treatment: 0 Edema Assessment Assessed: [Left: Yes] [Right: Yes] Edema: [Left: Yes] [Right: Yes] Calf Left: Right: Point of Measurement: 29 cm From Medial Instep 37 cm 36.2 cm Ankle Left: Right: Point of Measurement: 11 cm From Medial Instep 25.5 cm 25 cm Knee To Floor Left: Right: From Medial Instep 43 cm 43 cm Vascular Assessment Pulses: Dorsalis Pedis Palpable: [Left:No] [Right:No] Doppler Audible: [Left:Yes] [Right:Yes] Posterior Tibial Palpable: [Left:No Yes] [Right:No Yes] Electronic Signature(s) Signed: 10/20/2020 5:06:40 PM By: Georges Mouse, Minus Breeding RN Entered By: Georges Mouse, Minus Breeding on 10/19/2020 10:28:26 Joseph Graham ([id number]) -------------------------------------------------------------------------------- Multi Wound Chart Details Patient Name: Joseph Graham. Date of Service: 10/19/2020 10:00 AM Medical Record Number: [id number] Patient Account Number: [id number] Date of Birth/Sex: May 27, 1941 (80 y.o.  M) Treating RN: Cornell Barman Primary Care Ica Daye: [name] Other Clinician: Referring Athleen Feltner: Eulogio Ditch Treating Andrews Tener/Extender: Tito Dine in Treatment: 0 Vital Signs Height(in): 68 Pulse(bpm): 63 Weight(lbs): 178 Blood Pressure(mmHg): 137/67 Body Mass Index(BMI): 27 Temperature(F): 98.2 Respiratory Rate(breaths/min): 20 Wound Assessments Treatment Notes Electronic Signature(s) Signed: 10/19/2020 4:52:48 PM By: Linton Ham MD Entered By: Linton Ham on 10/19/2020 11:21:58 Joseph Graham ([id number]) -------------------------------------------------------------------------------- [place] Details Patient Name: Joseph Graham. Date of Service: 10/19/2020 10:00 AM Medical Record Number: [id number] Patient Account Number: [id number] Date of Birth/Sex: 11-14-1940 (80 y.o. M) Treating RN: Cornell Barman Primary Care Lilit Cinelli: [name] Other Clinician: Referring Ladashia Demarinis: Eulogio Ditch Treating Sue Mcalexander/Extender: Tito Dine in Treatment: 0 Active Inactive Orientation to the Wound Care Program Nursing Diagnoses: Knowledge deficit related to the  wound healing center program Goals: Patient/caregiver will verbalize understanding of the [employer] Date Initiated: 10/19/2020 Target Resolution Date: 11/02/2020 Goal Status: Active Interventions: Provide education on orientation to the wound center Notes: Peripheral Neuropathy Nursing Diagnoses: Knowledge deficit related to disease process and management of peripheral neurovascular dysfunction Potential alteration in peripheral tissue perfusion (select prior to confirmation of diagnosis) Goals: Patient/caregiver will verbalize understanding of disease process and disease management Date Initiated: 10/19/2020 Target Resolution Date: 10/26/2020 Goal Status: Active Interventions: Assess signs and symptoms of neuropathy upon admission and as  needed Treatment Activities: Patient referred to diabetes educator : 10/19/2020 Notes: Venous Leg Ulcer Nursing Diagnoses: Actual venous Insuffiency (use after diagnosis is confirmed) Goals: Patient will maintain optimal edema control Date Initiated: 10/19/2020 Target Resolution Date: 11/02/2020 Goal Status: Active Verify adequate tissue perfusion prior to therapeutic compression application Date Initiated: 10/19/2020 Target Resolution Date: 11/02/2020 Goal Status: Active Interventions: Assess peripheral edema status every visit. Treatment Activities: Non-invasive vascular studies : 10/19/2020 Therapeutic compression applied : 10/19/2020 Notes: Joseph Graham, Joseph Graham ([id number]) Wound/Skin Impairment Nursing Diagnoses: Knowledge deficit related to smoking impact on wound healing Goals: Ulcer/skin breakdown will have a volume reduction of 30% by week 4 Date Initiated: 10/19/2020 Target Resolution Date: 11/19/2020 Goal Status: Active Interventions: Assess ulceration(s) every visit Treatment Activities: Skin care regimen initiated : 10/19/2020 Topical wound management initiated : 10/19/2020 Notes: Electronic Signature(s) Signed: 10/19/2020 6:06:01 PM By: Gretta Cool, BSN, RN, CWS, Kim RN, BSN Entered By: Gretta Cool, BSN, RN, CWS, Kim on 10/19/2020 10:52:46 Joseph Graham ([id number]) -------------------------------------------------------------------------------- Non-Wound Condition Assessment Details Patient Name: Joseph Graham. Date of Service: 10/19/2020 10:00 AM Medical Record Number: [id number] Patient Account Number: [id number] Date of Birth/Sex: 04/14/1941 (80 y.o. M) Treating RN: Dolan Amen Primary Care Morgane Joerger: [name] Other Clinician: Referring Rileyann Florance: Eulogio Ditch Treating Emberlee Sortino/Extender: Ricard Dillon Weeks in Treatment: 0 Non-Wound Condition: Condition: Lymphedema Location: Leg Side: Bilateral Photos Notes Left leg increased serous  drainage Electronic Signature(s) Signed: 10/20/2020 5:06:40 PM By: Georges Mouse, Minus Breeding RN Entered By: Georges Mouse, Minus Breeding on 10/19/2020 10:30:52 Joseph Graham ([id number]) -------------------------------------------------------------------------------- Pain Assessment Details Patient Name: Joseph Graham. Date of Service: 10/19/2020 10:00 AM Medical Record Number: [id number] Patient Account Number: [id number] Date of Birth/Sex: 12/19/1940 (80 y.o. M) Treating RN: Dolan Amen Primary Care Jehad Bisono: [name] Other Clinician: Referring Anessia Oakland: Eulogio Ditch Treating Anija Brickner/Extender: Tito Dine in Treatment: 0 Active Problems Location of Pain Severity and Description of Pain Patient Has Paino Yes Site Locations Pain Location: Pain in Ulcers Duration of the Pain. Constant / Intermittento Constant Rate the pain. Current Pain Level: 6 Character of Pain Describe the Pain: Throbbing Pain Management and Medication Current Pain Management: Electronic Signature(s) Signed: 10/20/2020 5:06:40 PM By: Georges Mouse, Minus Breeding RN Entered By: Georges Mouse, Minus Breeding on 10/19/2020 10:01:08 Joseph Graham ([id number]) -------------------------------------------------------------------------------- Patient/Caregiver Education Details Patient Name: Joseph Graham. Date of Service: 10/19/2020 10:00 AM Medical Record Number: [id number] Patient Account Number: [id number] Date of Birth/Gender: 19-Apr-1941 (80 y.o. M) Treating RN: Cornell Barman Primary Care Physician: [name] Other Clinician: Referring Physician: Eulogio Ditch Treating Physician/Extender: Tito Dine in Treatment: 0 Education Assessment Education Provided To: Patient Education Topics Provided Peripheral Neuropathy: Handouts: Diabetes, Neuropathy Methods: Explain/Verbal Responses: State content correctly Welcome To [facility] [place]: Handouts: Welcome To [facility] [place] Methods: Demonstration, Explain/Verbal Responses: State content correctly Wound/Skin Impairment: Handouts: Caring for Your Ulcer Methods: Demonstration, Explain/Verbal Responses: State content correctly Electronic Signature(s) Signed: 10/19/2020 6:06:01 PM  By: Gretta Cool, BSN, RN, CWS, Kim RN, BSN Entered By: Gretta Cool, BSN, RN, CWS, Kim on 10/19/2020 11:00:22 Joseph Graham ([id number]) -------------------------------------------------------------------------------- [place] Details Patient Name: Joseph Graham Date of Service: 10/19/2020 10:00 AM Medical Record Number: [id number] Patient Account Number: [id number] Date of Birth/Sex: [birth year] (80 y.o. M) Treating RN: Dolan Amen Primary Care Kimberlee Shoun: [name] Other Clinician: Referring Emilyann Banka: Eulogio Ditch Treating Brylin Stanislawski/Extender: Tito Dine in Treatment: 0 Vital Signs Time Taken: 10:01 Temperature (F): 98.2 Height (in): 68 Pulse (bpm): 63 Source: Stated Respiratory Rate (breaths/min): 20 Weight (lbs): 178 Blood Pressure (mmHg): 137/67 Source: Stated Reference Range: 80 - 120 mg / dl Body Mass Index (BMI): 27.1 Airway Pulse Oximetry (%): 98 Inhaled Oxygen Concentration (%): 4 Notes weight and height stated by patient, patient unsteady-unable to obtain wt/ht in office Electronic Signature(s) Signed: 10/20/2020 5:06:40 PM By: Georges Mouse, Minus Breeding RN Entered By: Georges Mouse, Minus Breeding on 10/19/2020 10:04:42

## 2020-10-21 NOTE — Progress Notes (Signed)
EINER, MEALS ([id number]) Visit Report for 10/19/2020 Abuse/Suicide Risk Screen Details Patient Name: Joseph Graham. Date of Service: 10/19/2020 10:00 AM Medical Record Number: [id number] Patient Account Number: [id number] Date of Birth/Sex: 1941-04-08 (80 y.o. M) Treating RN: Dolan Amen Primary Care Tremane Spurgeon: [name] Other Clinician: Referring Rollin Kotowski: Eulogio Ditch Treating Janisa Labus/Extender: Tito Dine in Treatment: 0 Abuse/Suicide Risk Screen Items Answer ABUSE RISK SCREEN: Has anyone close to you tried to hurt or harm you recentlyo No Do you feel uncomfortable with anyone in your familyo No Has anyone forced you do things that you didnot want to doo No Electronic Signature(s) Signed: 10/20/2020 5:06:40 PM By: Georges Mouse, Minus Breeding RN Entered By: Georges Mouse, Kenia on 10/19/2020 10:15:59 Joseph Graham ([id number]) -------------------------------------------------------------------------------- Activities of Daily Living Details Patient Name: Joseph Graham. Date of Service: 10/19/2020 10:00 AM Medical Record Number: [id number] Patient Account Number: [id number] Date of Birth/Sex: 1940-11-13 (80 y.o. M) Treating RN: Dolan Amen Primary Care Chaslyn Eisen: [name] Other Clinician: Referring Zahari Fazzino: Eulogio Ditch Treating Tymeir Weathington/Extender: Tito Dine in Treatment: 0 Activities of Daily Living Items Answer Activities of Daily Living (Please select one for each item) Drive Automobile Need Assistance Take Medications Need Assistance Use Telephone Need Assistance Care for Appearance Need Assistance Use Toilet Need Assistance Bath / Shower Need Assistance Dress Self Need Assistance Feed Self Need Assistance Walk Need Assistance Get In / Out Bed Need Assistance Housework Need Assistance Prepare Meals Need Assistance Handle Money Need Assistance Shop for Self Need Assistance Electronic Signature(s) Signed:  10/20/2020 5:06:40 PM By: Georges Mouse, Minus Breeding RN Entered By: Georges Mouse, Minus Breeding on 10/19/2020 10:16:20 Joseph Graham ([id number]) -------------------------------------------------------------------------------- Education Screening Details Patient Name: Joseph Graham Date of Service: 10/19/2020 10:00 AM Medical Record Number: [id number] Patient Account Number: [id number] Date of Birth/Sex: [birth year] (80 y.o. M) Treating RN: Dolan Amen Primary Care Faris Coolman: [name] Other Clinician: Referring Lyah Millirons: Eulogio Ditch Treating Aaliyha Mumford/Extender: Tito Dine in Treatment: 0 Primary Learner Assessed: Patient Learning Preferences/Education Level/Primary Language Learning Preference: Explanation, Demonstration Highest Education Level: College or Above Preferred Language: English Cognitive Barrier Language Barrier: No Translator Needed: No Memory Deficit: No Emotional Barrier: No Cultural/Religious Beliefs Affecting Medical Care: No Physical Barrier Impaired Vision: No Impaired Hearing: No Decreased Hand dexterity: No Knowledge/Comprehension Knowledge Level: Medium Comprehension Level: Medium Ability to understand written instructions: Medium Ability to understand verbal instructions: Medium Motivation Anxiety Level: Calm Cooperation: Cooperative Education Importance: Acknowledges Need Interest in Health Problems: Asks Questions Perception: Coherent Willingness to Engage in Self-Management High Activities: Readiness to Engage in Self-Management High Activities: Electronic Signature(s) Signed: 10/20/2020 5:06:40 PM By: Georges Mouse, Minus Breeding RN Entered By: Georges Mouse, Minus Breeding on 10/19/2020 10:17:00 Joseph Graham ([id number]) -------------------------------------------------------------------------------- Fall Risk Assessment Details Patient Name: Joseph Graham Date of Service: 10/19/2020 10:00 AM Medical Record Number:  [id number] Patient Account Number: [id number] Date of Birth/Sex: April 10, 1941 (80 y.o. M) Treating RN: Dolan Amen Primary Care Trey Gulbranson: [name] Other Clinician: Referring Lanie Schelling: Eulogio Ditch Treating Sandrea Boer/Extender: Tito Dine in Treatment: 0 Fall Risk Assessment Items Have you had 2 or more falls in the last 12 monthso 0 Yes Have you had any fall that resulted in injury in the last 12 monthso 0 No FALLS RISK SCREEN History of falling - immediate or within 3 months 25 Yes Secondary diagnosis (Do you have 2 or more medical diagnoseso) 15 Yes Ambulatory aid None/bed rest/wheelchair/nurse 0 No Crutches/cane/walker 15 Yes Furniture 0 No Intravenous therapy Access/Saline/Heparin Lock 0 No  Gait/Transferring Normal/ bed rest/ wheelchair 0 No Weak (short steps with or without shuffle, stooped but able to lift head while walking, may 10 Yes seek support from furniture) Impaired (short steps with shuffle, may have difficulty arising from chair, head down, impaired 0 No balance) Mental Status Oriented to own ability 0 Yes Electronic Signature(s) Signed: 10/20/2020 5:06:40 PM By: Georges Mouse, Minus Breeding RN Entered By: Georges Mouse, Kenia on 10/19/2020 10:17:13 Joseph Graham ([id number]) -------------------------------------------------------------------------------- Foot Assessment Details Patient Name: Joseph Graham. Date of Service: 10/19/2020 10:00 AM Medical Record Number: [id number] Patient Account Number: [id number] Date of Birth/Sex: [birth year] (80 y.o. M) Treating RN: Dolan Amen Primary Care Aaralynn Shepheard: [name] Other Clinician: Referring Shannen Flansburg: Eulogio Ditch Treating Hyun Reali/Extender: Tito Dine in Treatment: 0 Foot Assessment Items Site Locations + = Sensation present, - = Sensation absent, C = Callus, U = Ulcer R = Redness, W = Warmth, M = Maceration, PU = Pre-ulcerative lesion F = Fissure, S = Swelling, D =  Dryness Assessment Right: Left: Other Deformity: No No Prior Foot Ulcer: No No Prior Amputation: No No Charcot Joint: No No Ambulatory Status: Ambulatory With Help Assistance Device: Walker Gait: Administrator, arts) Signed: 10/20/2020 5:06:40 PM By: Georges Mouse, Minus Breeding RN Entered By: Georges Mouse, Minus Breeding on 10/19/2020 10:25:00 Joseph Graham ([id number]) -------------------------------------------------------------------------------- Nutrition Risk Screening Details Patient Name: Joseph Graham. Date of Service: 10/19/2020 10:00 AM Medical Record Number: [id number] Patient Account Number: [id number] Date of Birth/Sex: 04/22/1941 (80 y.o. M) Treating RN: Dolan Amen Primary Care Reyan Helle: [name] Other Clinician: Referring Narissa Beaufort: Eulogio Ditch Treating Eliasar Hlavaty/Extender: Tito Dine in Treatment: 0 Height (in): 68 Weight (lbs): 178 Body Mass Index (BMI): 27.1 Nutrition Risk Screening Items Score Screening NUTRITION RISK SCREEN: I have an illness or condition that made me change the kind and/or amount of food I eat 2 Yes I eat fewer than two meals per day 3 Yes I eat few fruits and vegetables, or milk products 2 Yes I have three or more drinks of beer, liquor or wine almost every day 0 No I have tooth or mouth problems that make it hard for me to eat 0 No I don't always have enough money to buy the food I need 0 No I eat alone most of the time 0 No I take three or more different prescribed or over-the-counter drugs a day 1 Yes Without wanting to, I have lost or gained 10 pounds in the last six months 0 No I am not always physically able to shop, cook and/or feed myself 0 No Nutrition Protocols Good Risk Protocol Moderate Risk Protocol High Risk Proctocol 0 Provide education on nutrition Risk Level: High Risk Score: 8 Electronic Signature(s) Signed: 10/20/2020 5:06:40 PM By: Georges Mouse, Minus Breeding RN Entered By: Georges Mouse, Minus Breeding on 10/19/2020 10:17:50

## 2020-10-21 NOTE — Progress Notes (Signed)
DISHAWN, Graham ([id number]) Visit Report for 10/19/2020 Chief Complaint Document Details Patient Name: Joseph Graham, Joseph Graham. Date of Service: 10/19/2020 10:00 AM Medical Record Number: [id number] Patient Account Number: [id number] Date of Birth/Sex: 1940/11/25 (80 y.o. M) Treating RN: Cornell Barman Primary Care Provider: [name] Other Clinician: Referring Provider: Eulogio Ditch Treating Provider/Extender: Tito Dine in Treatment: 0 Information Obtained from: Patient Chief Complaint 10/19/2020; patient is here for review of weeping and skin breakdown on the left leg referred by vein and vascular Electronic Signature(s) Signed: 10/19/2020 4:52:48 PM By: Linton Ham MD Entered By: Linton Ham on 10/19/2020 11:22:53 Joseph Graham ([id number]) -------------------------------------------------------------------------------- HPI Details Patient Name: Joseph Graham. Date of Service: 10/19/2020 10:00 AM Medical Record Number: [id number] Patient Account Number: [id number] Date of Birth/Sex: 02-12-1941 (80 y.o. M) Treating RN: Cornell Barman Primary Care Provider: [name] Other Clinician: Referring Provider: Eulogio Ditch Treating Provider/Extender: Tito Dine in Treatment: 0 History of Present Illness HPI Description: ADMISSION 10/19/2020 This is a patient referred from vein and vascular for review of predominantly venous ulcers on his left leg. He is already had a noninvasive arterial review. He has had weeping and skin desquamation and blistering on the left leg for several months now. He was offered an Pension scheme manager at [facility] which she declined stating his neuropathy in his leg was too painful. He already has standard over the toe stockings that he does not wear. He has venous stasis in the left leg, bilateral pitting edema. He has had desquamation of the surface epithelium on a large area of the left anterior. Unfortunately without  compression this is only going to get worse. Past medical history includes COPD, right upper lobe lung CA, chronic oxygen, type 2 diabetes with peripheral neuropathy Arterial; ABIs were done at [facility] on 10/04/2020; this showed an ABI in the right of 0.52 with biphasic and monophasic waveforms however his TBI was normal at 0.99. On the left he had an ABI of 0.87 with biphasic and monophasic waveforms but a great toe pressure almost normal at 0.68 Electronic Signature(s) Signed: 10/19/2020 4:52:48 PM By: Linton Ham MD Entered By: Linton Ham on 10/19/2020 11:28:15 Joseph Graham ([id number]) -------------------------------------------------------------------------------- Physical Exam Details Patient Name: Joseph Graham. Date of Service: 10/19/2020 10:00 AM Medical Record Number: [id number] Patient Account Number: [id number] Date of Birth/Sex: 17-Mar-1941 (80 y.o. M) Treating RN: Cornell Barman Primary Care Provider: [name] Other Clinician: Referring Provider: Eulogio Ditch Treating Provider/Extender: Tito Dine in Treatment: 0 Constitutional Sitting or standing Blood Pressure is within target range for patient.. Pulse regular and within target range for patient.Marland Kitchen Respirations regular, non- labored and within target range.. Temperature is normal and within the target range for the patient.Marland Kitchen appears in no distress. Respiratory Respiratory effort is easy and symmetric bilaterally. Rate is normal at rest and on room air.. Cardiovascular No evidence of CHF. No elevation of jugular venous pressure. Pedal pulses palpable. Bilateral lower extremity pitting edema with stasis dermatitis predominantly on the left. Notes Wound exam; fortunately not an excessive number of open wounds however he does have a large area anteriorly over the tibia with loss of surface epithelium and weeping edema. Circumferential erythema but no tenderness this is compatible with  stasis dermatitis Electronic Signature(s) Signed: 10/19/2020 4:52:48 PM By: Linton Ham MD Entered By: Linton Ham on 10/19/2020 11:30:46 Joseph Graham ([id number]) -------------------------------------------------------------------------------- Physician Orders Details Patient Name: Joseph Graham. Date of Service: 10/19/2020 10:00 AM Medical  Record Number: [id number] Patient Account Number: [id number] Date of Birth/Sex: [birth year] (80 y.o. M) Treating RN: Cornell Barman Primary Care Provider: [name] Other Clinician: Referring Provider: Eulogio Ditch Treating Provider/Extender: Tito Dine in Treatment: 0 Verbal / Phone Orders: No Diagnosis Coding Follow-up Appointments o Return Appointment in 1 month Bathing/ Shower/ Hygiene o May shower with wound dressing protected with water repellent cover or cast protector. Edema Control - Lymphedema / Segmental Compressive Device / Other Left Lower Extremity o Optional: One layer of unna paste to top of compression wrap (to act as an anchor). o 3 Layer Compression System for Lymphedema. - Silver alginate on weeping areas o Elevate, Exercise Daily and Avoid Standing for Long Periods of Time. o Elevate legs to the level of the heart and pump ankles as often as possible o Elevate leg(s) parallel to the floor when sitting. o DO YOUR BEST to sleep in the bed at night. DO NOT sleep in your recliner. Long hours of sitting in a recliner leads to swelling of the legs and/or potential wounds on your backside. Additional Orders / Instructions o Follow Nutritious Diet and Increase Protein Intake Oxygen Administration o While patient is in clinic, provide supplemental oxygen via nasal cannula - 4L O2 Services and Therapies o DME provider, dressing supplies - Order Velcro Wraps when swelling is decreased in lower legs. Electronic Signature(s) Signed: 10/19/2020 4:52:48 PM By: Linton Ham MD Signed:  10/19/2020 6:06:01 PM By: Gretta Cool, BSN, RN, CWS, Kim RN, BSN Entered By: Gretta Cool, BSN, RN, CWS, Kim on 10/19/2020 11:02:30 URIAN, MARTENSON ([id number]) -------------------------------------------------------------------------------- Problem List Details Patient Name: Joseph, Graham. Date of Service: 10/19/2020 10:00 AM Medical Record Number: [id number] Patient Account Number: [id number] Date of Birth/Sex: [birth year] (80 y.o. M) Treating RN: Cornell Barman Primary Care Provider: [name] Other Clinician: Referring Provider: Eulogio Ditch Treating Provider/Extender: Tito Dine in Treatment: 0 Active Problems ICD-10 Encounter Code Description Active Date MDM Diagnosis I87.332 Chronic venous hypertension (idiopathic) with ulcer and inflammation of 10/19/2020 No Yes left lower extremity L97.821 Non-pressure chronic ulcer of other part of left lower leg limited to 10/19/2020 No Yes breakdown of skin Inactive Problems Resolved Problems Electronic Signature(s) Signed: 10/19/2020 4:52:48 PM By: Linton Ham MD Entered By: Linton Ham on 10/19/2020 11:21:51 Joseph Graham ([id number]) -------------------------------------------------------------------------------- Progress Note Details Patient Name: Joseph Graham. Date of Service: 10/19/2020 10:00 AM Medical Record Number: [id number] Patient Account Number: [id number] Date of Birth/Sex: 1941-03-26 (80 y.o. M) Treating RN: Cornell Barman Primary Care Provider: [name] Other Clinician: Referring Provider: Eulogio Ditch Treating Provider/Extender: Tito Dine in Treatment: 0 Subjective Chief Complaint Information obtained from Patient 10/19/2020; patient is here for review of weeping and skin breakdown on the left leg referred by vein and vascular History of Present Illness (HPI) ADMISSION 10/19/2020 This is a patient referred from [facility] for review of predominantly venous ulcers on his  left leg. He is already had a noninvasive arterial review. He has had weeping and skin desquamation and blistering on the left leg for several months now. He was offered an Pension scheme manager at [facility] which she declined stating his neuropathy in his leg was too painful. He already has standard over the toe stockings that he does not wear. He has venous stasis in the left leg, bilateral pitting edema. He has had desquamation of the surface epithelium on a large area of the left anterior. Unfortunately without compression this is only going to get  worse. Past medical history includes COPD, right upper lobe lung CA, chronic oxygen, type 2 diabetes with peripheral neuropathy Arterial; ABIs were done at [facility] on 10/04/2020; this showed an ABI in the right of 0.52 with biphasic and monophasic waveforms however his TBI was normal at 0.99. On the left he had an ABI of 0.87 with biphasic and monophasic waveforms but a great toe pressure almost normal at 0.68 Patient History Allergies No Known Drug Allergies Social History Former smoker, Marital Status - Married, Alcohol Use - Never, Drug Use - No History, Caffeine Use - Never. Medical History Eyes Patient has history of Cataracts Denies history of Glaucoma, Optic Neuritis Ear/Nose/Mouth/Throat Denies history of Chronic sinus problems/congestion, Middle ear problems Hematologic/Lymphatic Patient has history of Lymphedema Denies history of Anemia, Hemophilia, Human Immunodeficiency Virus, Sickle Cell Disease Respiratory Patient has history of Chronic Obstructive Pulmonary Disease (COPD) Denies history of Aspiration, Asthma, Pneumothorax, Sleep Apnea, Tuberculosis Cardiovascular Patient has history of Coronary Artery Disease, Hypertension, Peripheral Arterial Disease Denies history of Angina, Arrhythmia, Congestive Heart Failure, Deep Vein Thrombosis, Hypotension, Myocardial Infarction, Peripheral Venous Disease, Phlebitis,  Vasculitis Gastrointestinal Denies history of Cirrhosis , Colitis, Crohn s, Hepatitis A, Hepatitis B, Hepatitis C Endocrine Patient has history of Type II Diabetes Denies history of Type I Diabetes Genitourinary Denies history of End Stage Renal Disease Immunological Denies history of Lupus Erythematosus, Raynaud s, Scleroderma Integumentary (Skin) Denies history of History of Burn, History of pressure wounds Musculoskeletal Patient has history of Osteoarthritis Denies history of Gout, Rheumatoid Arthritis, Osteomyelitis Neurologic Patient has history of Neuropathy Denies history of Dementia, Quadriplegia, Paraplegia, Seizure Disorder Oncologic Patient has history of Received Chemotherapy, Received Radiation - 35 treatments Psychiatric WINSLOW, EDERER ([id number]) Denies history of Anorexia/bulimia, Confinement Anxiety Medical And Surgical History Notes Eyes macular degeneration Respiratory malignant neoplasm of right upper lung Oncologic malignant neoplasm of right upper lung Review of Systems (ROS) Constitutional Symptoms (General Health) Complains or has symptoms of Fatigue. Denies complaints or symptoms of Fever, Chills, Marked Weight Change. Eyes Denies complaints or symptoms of Dry Eyes, Vision Changes, Glasses / Contacts. Ear/Nose/Mouth/Throat Denies complaints or symptoms of Difficult clearing ears, Sinusitis. Hematologic/Lymphatic Denies complaints or symptoms of Bleeding / Clotting Disorders, Human Immunodeficiency Virus. Respiratory Complains or has symptoms of Shortness of Breath. Denies complaints or symptoms of Chronic or frequent coughs. Cardiovascular Denies complaints or symptoms of Chest pain, LE edema. Gastrointestinal Denies complaints or symptoms of Frequent diarrhea, Nausea, Vomiting. Endocrine Denies complaints or symptoms of Hepatitis, Thyroid disease, Polydypsia (Excessive Thirst). Genitourinary Denies complaints or symptoms of Kidney  failure/ Dialysis, Incontinence/dribbling. Immunological Denies complaints or symptoms of Hives, Itching. Integumentary (Skin) Complains or has symptoms of Wounds, Swelling. Denies complaints or symptoms of Bleeding or bruising tendency, Breakdown. Musculoskeletal Complains or has symptoms of Muscle Weakness. Denies complaints or symptoms of Muscle Pain. Neurologic Denies complaints or symptoms of Numbness/parasthesias, Focal/Weakness. Psychiatric Denies complaints or symptoms of Anxiety, Claustrophobia. Objective Constitutional Sitting or standing Blood Pressure is within target range for patient.. Pulse regular and within target range for patient.Marland Kitchen Respirations regular, non- labored and within target range.. Temperature is normal and within the target range for the patient.Marland Kitchen appears in no distress. Vitals Time Taken: 10:01 AM, Height: 68 in, Source: Stated, Weight: 178 lbs, Source: Stated, BMI: 27.1, Temperature: 98.2 F, Pulse: 63 bpm, Respiratory Rate: 20 breaths/min, Blood Pressure: 137/67 mmHg, Pulse Oximetry: 98 %. General Notes: weight and height stated by patient, patient unsteady-unable to obtain wt/ht in office Respiratory Respiratory effort is  easy and symmetric bilaterally. Rate is normal at rest and on room air.. Cardiovascular No evidence of CHF. No elevation of jugular venous pressure. Pedal pulses palpable. Bilateral lower extremity pitting edema with stasis dermatitis predominantly on the left. General Notes: Wound exam; fortunately not an excessive number of open wounds however he does have a large area anteriorly over the tibia with loss of surface epithelium and weeping edema. Circumferential erythema but no tenderness this is compatible with stasis dermatitis AALIJAH, LANPHERE. ([id number]) Assessment Active Problems ICD-10 Chronic venous hypertension (idiopathic) with ulcer and inflammation of left lower extremity Non-pressure chronic ulcer of other part of left  lower leg limited to breakdown of skin Procedures There was a Three Layer Compression Therapy Procedure by Cornell Barman, RN. Post procedure Diagnosis Wound #: Same as Pre-Procedure Notes: TBI 09/30/20 L 0.68 R 0.99. Plan Follow-up Appointments: Return Appointment in 1 month Bathing/ Shower/ Hygiene: May shower with wound dressing protected with water repellent cover or cast protector. Edema Control - Lymphedema / Segmental Compressive Device / Other: Optional: One layer of unna paste to top of compression wrap (to act as an anchor). 3 Layer Compression System for Lymphedema. - Silver alginate on weeping areas Elevate, Exercise Daily and Avoid Standing for Long Periods of Time. Elevate legs to the level of the heart and pump ankles as often as possible Elevate leg(s) parallel to the floor when sitting. DO YOUR BEST to sleep in the bed at night. DO NOT sleep in your recliner. Long hours of sitting in a recliner leads to swelling of the legs and/or potential wounds on your backside. Additional Orders / Instructions: Follow Nutritious Diet and Increase Protein Intake Oxygen Administration: While patient is in clinic, provide supplemental oxygen via nasal cannula - 4L O2 Services and Therapies ordered were: DME provider, dressing supplies - Order Velcro Wraps when swelling is decreased in lower legs. 1. We put silver alginate on the open area on the left anterior leg under 3 layer compression 2. He seemed to tolerate this quite well like checked to see how he is feeling before he left the clinic 3. I have asked him to keep his leg elevated 4. He will need some form of ongoing compression once this area closes over we showed him variations of external compression stockings and probably will need to order these next week. In view of this I may need to wrap the right leg 5. The patient has mild PAD but I did not think this would preclude a 3 layer wrap. 4-layer wrap probably would be  excess Electronic Signature(s) Signed: 10/19/2020 4:52:48 PM By: Linton Ham MD Entered By: Linton Ham on 10/19/2020 11:33:09 Joseph Graham ([id number]) -------------------------------------------------------------------------------- ROS/PFSH Details Patient Name: Joseph Graham Date of Service: 10/19/2020 10:00 AM Medical Record Number: [id number] Patient Account Number: [id number] Date of Birth/Sex: Jan 07, 1941 (80 y.o. M) Treating RN: Dolan Amen Primary Care Provider: [name] Other Clinician: Referring Provider: Eulogio Ditch Treating Provider/Extender: Tito Dine in Treatment: 0 Constitutional Symptoms (General Health) Complaints and Symptoms: Positive for: Fatigue Negative for: Fever; Chills; Marked Weight Change Eyes Complaints and Symptoms: Negative for: Dry Eyes; Vision Changes; Glasses / Contacts Medical History: Positive for: Cataracts Negative for: Glaucoma; Optic Neuritis Past Medical History Notes: macular degeneration Ear/Nose/Mouth/Throat Complaints and Symptoms: Negative for: Difficult clearing ears; Sinusitis Medical History: Negative for: Chronic sinus problems/congestion; Middle ear problems Hematologic/Lymphatic Complaints and Symptoms: Negative for: Bleeding / Clotting Disorders; Human Immunodeficiency Virus Medical History: Positive for: Lymphedema Negative for:  Anemia; Hemophilia; Human Immunodeficiency Virus; Sickle Cell Disease Respiratory Complaints and Symptoms: Positive for: Shortness of Breath Negative for: Chronic or frequent coughs Medical History: Positive for: Chronic Obstructive Pulmonary Disease (COPD) Negative for: Aspiration; Asthma; Pneumothorax; Sleep Apnea; Tuberculosis Past Medical History Notes: malignant neoplasm of right upper lung Cardiovascular Complaints and Symptoms: Negative for: Chest pain; LE edema Medical History: Positive for: Coronary Artery Disease; Hypertension; Peripheral  Arterial Disease Negative for: Angina; Arrhythmia; Congestive Heart Failure; Deep Vein Thrombosis; Hypotension; Myocardial Infarction; Peripheral Venous Disease; Phlebitis; Vasculitis Gastrointestinal Complaints and Symptoms: Negative for: Frequent diarrhea; Nausea; Vomiting Medical History: ERIKSON, DANZY ([id number]) Negative for: Cirrhosis ; Colitis; Crohnos; Hepatitis A; Hepatitis B; Hepatitis C Endocrine Complaints and Symptoms: Negative for: Hepatitis; Thyroid disease; Polydypsia (Excessive Thirst) Medical History: Positive for: Type II Diabetes Negative for: Type I Diabetes Genitourinary Complaints and Symptoms: Negative for: Kidney failure/ Dialysis; Incontinence/dribbling Medical History: Negative for: End Stage Renal Disease Immunological Complaints and Symptoms: Negative for: Hives; Itching Medical History: Negative for: Lupus Erythematosus; Raynaudos; Scleroderma Integumentary (Skin) Complaints and Symptoms: Positive for: Wounds; Swelling Negative for: Bleeding or bruising tendency; Breakdown Medical History: Negative for: History of Burn; History of pressure wounds Musculoskeletal Complaints and Symptoms: Positive for: Muscle Weakness Negative for: Muscle Pain Medical History: Positive for: Osteoarthritis Negative for: Gout; Rheumatoid Arthritis; Osteomyelitis Neurologic Complaints and Symptoms: Negative for: Numbness/parasthesias; Focal/Weakness Medical History: Positive for: Neuropathy Negative for: Dementia; Quadriplegia; Paraplegia; Seizure Disorder Psychiatric Complaints and Symptoms: Negative for: Anxiety; Claustrophobia Medical History: Negative for: Anorexia/bulimia; Confinement Anxiety Oncologic Medical History: Positive for: Received Chemotherapy; Received Radiation - 35 treatments Past Medical History Notes: malignant neoplasm of right upper lung QUANTRELL, SPLITT. ([id number]) HBO Extended History  Items Eyes: Cataracts Immunizations Pneumococcal Vaccine: Received Pneumococcal Vaccination: Yes Implantable Devices None Family and Social History Former smoker; Marital Status - Married; Alcohol Use: Never; Drug Use: No History; Caffeine Use: Never Electronic Signature(s) Signed: 10/19/2020 4:52:48 PM By: Linton Ham MD Signed: 10/20/2020 5:06:40 PM By: Georges Mouse, Minus Breeding RN Entered By: Georges Mouse, Minus Breeding on 10/19/2020 10:15:18 Joseph Graham ([id number]) -------------------------------------------------------------------------------- [place] Details Patient Name: Joseph Graham Date of Service: 10/19/2020 Medical Record Number: [id number] Patient Account Number: [id number] Date of Birth/Sex: Dec 18, 1940 (80 y.o. M) Treating RN: Cornell Barman Primary Care Provider: [name] Other Clinician: Referring Provider: Eulogio Ditch Treating Provider/Extender: Tito Dine in Treatment: 0 Diagnosis Coding ICD-10 Codes Code Description [phone] Chronic venous hypertension (idiopathic) with ulcer and inflammation of left lower extremity L97.821 Non-pressure chronic ulcer of other part of left lower leg limited to breakdown of skin Facility Procedures CPT4 Code: [id number] Description: [facility] VISIT-LEV 3 EST PT Modifier: Quantity: 1 CPT4 Code: [id number] Description: (Facility Use Only) 29581LT - [facility] LWR LT LEG Modifier: Quantity: 1 Physician Procedures CPT4 Code Description: [id number] WC PHYS LEVEL 3 o NEW PT Modifier: Quantity: 1 CPT4 Code Description: ICD-10 Diagnosis Description I87.332 Chronic venous hypertension (idiopathic) with ulcer and inflammation of le L97.821 Non-pressure chronic ulcer of other part of left lower leg limited to brea Modifier: ft lower extremity kdown of skin Quantity: Electronic Signature(s) Signed: 10/19/2020 4:52:48 PM By: Linton Ham MD Entered By: Linton Ham on 10/19/2020 11:33:44

## 2020-10-26 ENCOUNTER — Other Ambulatory Visit: Payer: Self-pay

## 2020-10-26 ENCOUNTER — Encounter: Payer: Medicare Other | Admitting: Internal Medicine

## 2020-10-26 DIAGNOSIS — I872 Venous insufficiency (chronic) (peripheral): Secondary | ICD-10-CM | POA: Diagnosis not present

## 2020-10-26 DIAGNOSIS — J449 Chronic obstructive pulmonary disease, unspecified: Secondary | ICD-10-CM | POA: Diagnosis not present

## 2020-10-26 DIAGNOSIS — Z85118 Personal history of other malignant neoplasm of bronchus and lung: Secondary | ICD-10-CM | POA: Diagnosis not present

## 2020-10-26 DIAGNOSIS — L97821 Non-pressure chronic ulcer of other part of left lower leg limited to breakdown of skin: Secondary | ICD-10-CM | POA: Diagnosis not present

## 2020-10-26 DIAGNOSIS — I87332 Chronic venous hypertension (idiopathic) with ulcer and inflammation of left lower extremity: Secondary | ICD-10-CM | POA: Diagnosis not present

## 2020-10-26 DIAGNOSIS — E1142 Type 2 diabetes mellitus with diabetic polyneuropathy: Secondary | ICD-10-CM | POA: Diagnosis not present

## 2020-10-26 DIAGNOSIS — Z9981 Dependence on supplemental oxygen: Secondary | ICD-10-CM | POA: Diagnosis not present

## 2020-10-26 DIAGNOSIS — I890 Lymphedema, not elsewhere classified: Secondary | ICD-10-CM | POA: Diagnosis not present

## 2020-10-26 DIAGNOSIS — I10 Essential (primary) hypertension: Secondary | ICD-10-CM | POA: Diagnosis not present

## 2020-10-26 DIAGNOSIS — E11622 Type 2 diabetes mellitus with other skin ulcer: Secondary | ICD-10-CM | POA: Diagnosis not present

## 2020-10-27 NOTE — Progress Notes (Signed)
Joseph Graham, Joseph Graham ([id number]) Visit Report for 10/26/2020 Arrival Information Details Patient Name: Joseph Graham, Joseph Graham. Date of Service: 10/26/2020 10:15 AM Medical Record Number: [id number] Patient Account Number: [id number] Date of Birth/Sex: [birth year] (80 y.o. M) Treating RN: Donnamarie Poag Primary Care Lorenza Winkleman: [name] Other Clinician: Referring Sharaya Boruff: [name] Treating Zeddie Njie/Extender: Tito Dine in Treatment: 1 Visit Information History Since Last Visit Added or deleted any medications: No Patient Arrived: Wheel Chair Had a fall or experienced change in No Arrival Time: 10:18 activities of daily living that may affect Accompanied By: wife risk of falls: Transfer Assistance: EasyPivot Patient Lift Hospitalized since last visit: No Patient Identification Verified: Yes Has Dressing in Place as Prescribed: Yes Secondary Verification Process Completed: Yes Has Compression in Place as Prescribed: Yes Patient Has Alerts: Yes Pain Present Now: Yes Patient Alerts: Type II Diabetic TBI 09/30/20 L 0.68 R 0.99 Electronic Signature(s) Signed: 10/27/2020 8:26:01 AM By: Donnamarie Poag Entered By: Donnamarie Poag on 10/26/2020 10:18:12 Joseph Graham ([id number]) -------------------------------------------------------------------------------- Clinic Level of Care Assessment Details Patient Name: Joseph Graham. Date of Service: 10/26/2020 10:15 AM Medical Record Number: [id number] Patient Account Number: [id number] Date of Birth/Sex: 11-27-1940 (80 y.o. M) Treating RN: Dolan Amen Primary Care Marcianne Ozbun: [name] Other Clinician: Referring Tsutomu Barfoot: [name] Treating Yamilett Anastos/Extender: Tito Dine in Treatment: 1 Clinic Level of Care Assessment Items TOOL 1 Quantity Score []  - Use when EandM and Procedure is performed on INITIAL visit 0 ASSESSMENTS - Nursing Assessment / Reassessment []  - General Physical Exam (combine w/  comprehensive assessment (listed just below) when performed on new 0 pt. evals) []  - 0 Comprehensive Assessment (HX, ROS, Risk Assessments, Wounds Hx, etc.) ASSESSMENTS - Wound and Skin Assessment / Reassessment []  - Dermatologic / Skin Assessment (not related to wound area) 0 ASSESSMENTS - Ostomy and/or Continence Assessment and Care []  - Incontinence Assessment and Management 0 []  - 0 Ostomy Care Assessment and Management (repouching, etc.) PROCESS - Coordination of Care []  - Simple Patient / Family Education for ongoing care 0 []  - 0 Complex (extensive) Patient / Family Education for ongoing care []  - 0 Staff obtains Programmer, systems, Records, Test Results / Process Orders []  - 0 Staff telephones HHA, Nursing Homes / Clarify orders / etc []  - 0 Routine Transfer to another Facility (non-emergent condition) []  - 0 Routine Hospital Admission (non-emergent condition) []  - 0 New Admissions / Biomedical engineer / Ordering NPWT, Apligraf, etc. []  - 0 Emergency Hospital Admission (emergent condition) PROCESS - Special Needs []  - Pediatric / Minor Patient Management 0 []  - 0 Isolation Patient Management []  - 0 Hearing / Language / Visual special needs []  - 0 Assessment of Community assistance (transportation, D/C planning, etc.) []  - 0 Additional assistance / Altered mentation []  - 0 Support Surface(s) Assessment (bed, cushion, seat, etc.) INTERVENTIONS - Miscellaneous []  - External ear exam 0 []  - 0 Patient Transfer (multiple staff / Civil Service fast streamer / Similar devices) []  - 0 Simple Staple / Suture removal (25 or less) []  - 0 Complex Staple / Suture removal (26 or more) []  - 0 Hypo/Hyperglycemic Management (do not check if billed separately) []  - 0 Ankle / Brachial Index (ABI) - do not check if billed separately Has the patient been seen at the hospital within the last three years: Yes Total Score: 0 Level Of Care: ____ Joseph Graham ([id number]) Electronic  Signature(s) Signed: 10/26/2020 12:48:14 PM By: Georges Mouse, Minus Breeding RN Entered By: Georges Mouse, Minus Breeding on 10/26/2020 10:37:01  Joseph Graham, Joseph Graham ([id number]) -------------------------------------------------------------------------------- Compression Therapy Details Patient Name: Joseph Graham, Joseph Graham. Date of Service: 10/26/2020 10:15 AM Medical Record Number: [id number] Patient Account Number: [id number] Date of Birth/Sex: October 04, 1940 (80 y.o. M) Treating RN: Dolan Amen Primary Care Jann Milkovich: [name] Other Clinician: Referring Rafaelita Foister: [name] Treating Josejuan Hoaglin/Extender: Tito Dine in Treatment: 1 Compression Therapy Performed for Wound Assessment: Non-Wound Location Performed By: Cora Daniels, RN Compression Type: Three Layer Location: Lower Extremity, Left Post Procedure Diagnosis Same as Pre-procedure Notes pt tolerating wrap well Electronic Signature(s) Signed: 10/26/2020 12:48:14 PM By: Georges Mouse, Minus Breeding RN Entered By: Georges Mouse, Ken[place] on 10/26/2020 10:31:18 Joseph Graham ([id number]) -------------------------------------------------------------------------------- Encounter Discharge Information Details Patient Name: Joseph Graham. Date of Service: 10/26/2020 10:15 AM Medical Record Number: [id number] Patient Account Number: [id number] Date of Birth/Sex: 1941/02/09 (80 y.o. M) Treating RN: Dolan Amen Primary Care Makaia Rappa: [name] Other Clinician: Referring Balraj Brayfield: [name] Treating Kareen Jefferys/Extender: Tito Dine in Treatment: 1 Encounter Discharge Information Items Discharge Condition: Stable Ambulatory Status: Wheelchair Discharge Destination: Home Transportation: Private Auto Accompanied By: wife Schedule Follow-up Appointment: Yes Clinical Summary of Care: Electronic Signature(s) Signed: 10/27/2020 5:05:23 PM By: Jeanine Luz Entered By: Jeanine Luz on  10/26/2020 10:57:17 Joseph Graham ([id number]) -------------------------------------------------------------------------------- Lower Extremity Assessment Details Patient Name: Joseph Graham. Date of Service: 10/26/2020 10:15 AM Medical Record Number: [id number] Patient Account Number: [id number] Date of Birth/Sex: 10-03-1940 (80 y.o. M) Treating RN: Donnamarie Poag Primary Care Alain Deschene: [name] Other Clinician: Referring Garris Melhorn: [name] Treating Lavonna Lampron/Extender: Tito Dine in Treatment: 1 Edema Assessment Assessed: [Left: Yes] [Right: Yes] [Left: Edema] [Right: :] Calf Left: Right: Point of Measurement: 29 cm From Medial Instep 35 cm 36.5 cm Ankle Left: Right: Point of Measurement: 11 cm From Medial Instep 25 cm 24.5 cm Vascular Assessment Pulses: Dorsalis Pedis Palpable: [Left:Yes] [Right:Yes] Electronic Signature(s) Signed: 10/27/2020 8:26:01 AM By: Donnamarie Poag Entered By: Donnamarie Poag on 10/26/2020 10:26:08 Joseph Graham ([id number]) -------------------------------------------------------------------------------- Multi Wound Chart Details Patient Name: Joseph Graham. Date of Service: 10/26/2020 10:15 AM Medical Record Number: [id number] Patient Account Number: [id number] Date of Birth/Sex: June 23, 1940 (80 y.o. M) Treating RN: Dolan Amen Primary Care Sherronda Sweigert: [name] Other Clinician: Referring Rathana Viveros: [name] Treating Clearence Vitug/Extender: Tito Dine in Treatment: 1 Vital Signs Height(in): 68 Pulse(bpm): 74 Weight(lbs): 178 Blood Pressure(mmHg): 150/61 Body Mass Index(BMI): 27 Temperature(F): 98.4 Respiratory Rate(breaths/min): 20 Wound Assessments Treatment Notes Electronic Signature(s) Signed: 10/26/2020 12:48:14 PM By: Georges Mouse, Minus Breeding RN Entered By: Georges Mouse, Minus Breeding on 10/26/2020 10:29:37 Joseph Graham  ([id number]) -------------------------------------------------------------------------------- [place] Details Patient Name: Joseph Graham. Date of Service: 10/26/2020 10:15 AM Medical Record Number: [id number] Patient Account Number: [id number] Date of Birth/Sex: Mar 17, 1941 (80 y.o. M) Treating RN: Dolan Amen Primary Care Adalina Dopson: [name] Other Clinician: Referring Kharson Rasmusson: [name] Treating Rosaire Cueto/Extender: Tito Dine in Treatment: 1 Active Inactive Peripheral Neuropathy Nursing Diagnoses: Knowledge deficit related to disease process and management of peripheral neurovascular dysfunction Potential alteration in peripheral tissue perfusion (select prior to confirmation of diagnosis) Goals: Patient/caregiver will verbalize understanding of disease process and disease management Date Initiated: 10/19/2020 Target Resolution Date: 10/26/2020 Goal Status: Active Interventions: Assess signs and symptoms of neuropathy upon admission and as needed Treatment Activities: Patient referred to diabetes educator : 10/19/2020 Notes: Venous Leg Ulcer Nursing Diagnoses: Actual venous Insuffiency (use after diagnosis is confirmed) Goals: Patient will maintain optimal edema control Date Initiated: 10/19/2020 Target Resolution Date: 11/02/2020 Goal  Status: Active Verify adequate tissue perfusion prior to therapeutic compression application Date Initiated: 10/19/2020 Target Resolution Date: 11/02/2020 Goal Status: Active Interventions: Assess peripheral edema status every visit. Treatment Activities: Non-invasive vascular studies : 10/19/2020 Therapeutic compression applied : 10/19/2020 Notes: Wound/Skin Impairment Nursing Diagnoses: Knowledge deficit related to smoking impact on wound healing Goals: Ulcer/skin breakdown will have a volume reduction of 30% by week 4 Date Initiated: 10/19/2020 Target Resolution Date: 11/19/2020 Goal  Status: Active Interventions: Assess ulceration(s) every visit Treatment Activities: Joseph Graham, Joseph Graham ([id number]) Skin care regimen initiated : 10/19/2020 Topical wound management initiated : 10/19/2020 Notes: Electronic Signature(s) Signed: 10/26/2020 12:48:14 PM By: Georges Mouse, Minus Breeding RN Entered By: Georges Mouse, Minus Breeding on 10/26/2020 10:29:30 Joseph Graham ([id number]) -------------------------------------------------------------------------------- Pain Assessment Details Patient Name: Joseph Graham. Date of Service: 10/26/2020 10:15 AM Medical Record Number: [id number] Patient Account Number: [id number] Date of Birth/Sex: [birth year] (80 y.o. M) Treating RN: Donnamarie Poag Primary Care Pamela Intrieri: [name] Other Clinician: Referring Nalaya Wojdyla: [name] Treating Jerrel Tiberio/Extender: Tito Dine in Treatment: 1 Active Problems Location of Pain Severity and Description of Pain Patient Has Paino Yes Site Locations Rate the pain. Current Pain Level: 4 Pain Management and Medication Current Pain Management: Electronic Signature(s) Signed: 10/27/2020 8:26:01 AM By: Donnamarie Poag Entered By: Donnamarie Poag on 10/26/2020 10:21:25 Joseph Graham ([id number]) -------------------------------------------------------------------------------- Patient/Caregiver Education Details Patient Name: Joseph Graham Date of Service: 10/26/2020 10:15 AM Medical Record Number: [id number] Patient Account Number: [id number] Date of Birth/Gender: 05-May-1941 (80 y.o. M) Treating RN: Dolan Amen Primary Care Physician: [name] Other Clinician: Referring Physician: [name] Treating Physician/Extender: Tito Dine in Treatment: 1 Education Assessment Education Provided To: Patient Education Topics Provided Wound/Skin Impairment: Methods: Explain/Verbal Responses: State content correctly Electronic Signature(s) Signed: 10/26/2020  12:48:14 PM By: Georges Mouse, Minus Breeding RN Entered By: Georges Mouse, Minus Breeding on 10/26/2020 10:37:17 Joseph Graham ([id number]) -------------------------------------------------------------------------------- [place] Details Patient Name: Joseph Graham Date of Service: 10/26/2020 10:15 AM Medical Record Number: [id number] Patient Account Number: [id number] Date of Birth/Sex: 08/07/1940 (80 y.o. M) Treating RN: Donnamarie Poag Primary Care Connie Hilgert: [name] Other Clinician: Referring Tedd Cottrill: [name] Treating Pamlea Finder/Extender: Tito Dine in Treatment: 1 Vital Signs Time Taken: 10:20 Temperature (F): 98.4 Height (in): 68 Pulse (bpm): 74 Weight (lbs): 178 Respiratory Rate (breaths/min): 20 Body Mass Index (BMI): 27.1 Blood Pressure (mmHg): 150/61 Reference Range: 80 - 120 mg / dl Electronic Signature(s) Signed: 10/27/2020 8:26:01 AM By: Donnamarie Poag Entered ByDonnamarie Poag on 10/26/2020 10:21:17

## 2020-10-27 NOTE — Progress Notes (Signed)
DAVE, MERGEN ([id number]) Visit Report for 10/26/2020 HPI Details Patient Name: Joseph Graham, Joseph Graham. Date of Service: 10/26/2020 10:15 AM Medical Record Number: [id number] Patient Account Number: [id number] Date of Birth/Sex: 1941/05/25 (80 y.o. M) Treating RN: Dolan Amen Primary Care Provider: [name] Other Clinician: Referring Provider: [name] Treating Provider/Extender: Tito Dine in Treatment: 1 History of Present Illness HPI Description: ADMISSION 10/19/2020 This is a [facility] review of predominantly venous ulcers on his left leg. He is already had a noninvasive arterial review. He has had weeping and skin desquamation and blistering on the left leg for several months now. He was offered an Pension scheme manager at [facility] which she declined stating his neuropathy in his leg was too painful. He already has standard over the toe stockings that he does not wear. He has venous stasis in the left leg, bilateral pitting edema. He has had desquamation of the surface epithelium on a large area of the left anterior. Unfortunately without compression this is only going to get worse. Past medical history includes COPD, right upper lobe lung CA, chronic oxygen, type 2 diabetes with peripheral neuropathy Arterial; [facility]; this showed an ABI in the right of 0.52 with biphasic and monophasic waveforms however his TBI was normal at 0.99. On the left he had an ABI of 0.87 with biphasic and monophasic waveforms but a great toe pressure almost normal at 0.68 5/18; the de-epithelialized area on the left medial lower leg is closed. The patient has severe chronic venous insufficiency with stasis dermatitis in this area. Electronic Signature(s) Signed: 10/26/2020 5:17:14 PM By: Linton Ham MD Entered By: Linton Ham on 10/26/2020 10:40:32 Joseph Graham  ([id number]) -------------------------------------------------------------------------------- Physical Exam Details Patient Name: Joseph Graham Date of Service: 10/26/2020 10:15 AM Medical Record Number: [id number] Patient Account Number: [id number] Date of Birth/Sex: [birth year] (80 y.o. M) Treating RN: Dolan Amen Primary Care Provider: [name] Other Clinician: Referring Provider: [name] Treating Provider/Extender: Tito Dine in Treatment: 1 Constitutional Patient is hypertensive.. Pulse regular and within target range for patient.Marland Kitchen Respirations regular, non-labored and within target range.. Temperature is normal and within the target range for the patient.Marland Kitchen appears in no distress. Cardiovascular Pedal pulses are palpable. Notes Wound exam; everything is closed here is the area on the left medial lower leg is epithelialized. Calf circumference is down 2 cm which is all that it took to close this. 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 But he did tolerate the wrap. He does not have an arterial issue there is no evidence of infection Electronic Signature(s) Signed: 10/26/2020 5:17:14 PM By: Linton Ham MD Entered By: Linton Ham on 10/26/2020 10:41:46 Joseph Graham ([id number]) -------------------------------------------------------------------------------- Physician Orders Details Patient Name: Joseph Graham. Date of Service: 10/26/2020 10:15 AM Medical Record Number: [id number] Patient Account Number: [id number] Date of Birth/Sex: 01-10-1941 (80 y.o. M) Treating RN: Dolan Amen Primary Care Provider: [name] Other Clinician: Referring Provider: [name] Treating Provider/Extender: Tito Dine in Treatment: 1 Verbal / Phone Orders: No Diagnosis Coding Follow-up Appointments o Return Appointment in 1 week. Bathing/ Shower/ Hygiene o May shower with wound dressing  protected with water repellent cover or cast protector. Edema Control - Lymphedema / Segmental Compressive Device / Other Left Lower Extremity o Optional: One layer of unna paste to top of compression wrap (to act as an anchor). o 3 Layer Compression System  for Lymphedema. - Silver alginate on weeping areas and ABD as secondary o Elevate, Exercise Daily and Avoid Standing for Long Periods of Time. o Elevate legs to the level of the heart and pump ankles as often as possible o Elevate leg(s) parallel to the floor when sitting. o DO YOUR BEST to sleep in the bed at night. DO NOT sleep in your recliner. Long hours of sitting in a recliner leads to swelling of the legs and/or potential wounds on your backside. Additional Orders / Instructions o Follow Nutritious Diet and Increase Protein Intake Oxygen Administration o While patient is in clinic, provide supplemental oxygen via nasal cannula - 4L O2 Electronic Signature(s) Signed: 10/26/2020 12:48:14 PM By: Georges Mouse, Minus Breeding RN Signed: 10/26/2020 5:17:14 PM By: Linton Ham MD Entered By: Georges Mouse, Minus Breeding on 10/26/2020 10:36:56 Joseph Graham ([id number]) -------------------------------------------------------------------------------- Problem List Details Patient Name: Joseph Graham. Date of Service: 10/26/2020 10:15 AM Medical Record Number: [id number] Patient Account Number: [id number] Date of Birth/Sex: 13-Jan-1941 (80 y.o. M) Treating RN: Dolan Amen Primary Care Provider: [name] Other Clinician: Referring Provider: [name] Treating Provider/Extender: Tito Dine in Treatment: 1 Active Problems ICD-10 Encounter Code Description Active Date MDM Diagnosis I87.332 Chronic venous hypertension (idiopathic) with ulcer and inflammation of 10/19/2020 No Yes left lower extremity L97.821 Non-pressure chronic ulcer of other part of left lower leg limited to 10/19/2020 No  Yes breakdown of skin Inactive Problems Resolved Problems Electronic Signature(s) Signed: 10/26/2020 5:17:14 PM By: Linton Ham MD Entered By: Linton Ham on 10/26/2020 10:39:56 Joseph Graham ([id number]) -------------------------------------------------------------------------------- Progress Note Details Patient Name: Joseph Graham. Date of Service: 10/26/2020 10:15 AM Medical Record Number: [id number] Patient Account Number: [id number] Date of Birth/Sex: 08/22/1940 (80 y.o. M) Treating RN: Dolan Amen Primary Care Provider: [name] Other Clinician: Referring Provider: [name] Treating Provider/Extender: Tito Dine in Treatment: 1 Subjective History of Present Illness (HPI) ADMISSION 10/19/2020 This is a patient [facility] for review of predominantly venous ulcers on his left leg. He is already had a noninvasive arterial review. He has had weeping and skin desquamation and blistering on the left leg for several months now. He was offered an Pension scheme manager at [facility] which she declined stating his neuropathy in his leg was too painful. He already has standard over the toe stockings that he does not wear. He has venous stasis in the left leg, bilateral pitting edema. He has had desquamation of the surface epithelium on a large area of the left anterior. Unfortunately without compression this is only going to get worse. Past medical history includes COPD, right upper lobe lung CA, chronic oxygen, type 2 diabetes with peripheral neuropathy Arterial; [facility] on 10/[facility]/2020; this showed an ABI in the right of 0.52 with biphasic and monophasic waveforms however his TBI was normal at 0.99. On the left he had an ABI of 0.87 with biphasic and monophasic waveforms but a great toe pressure almost normal at 0.68 5/18; the de-epithelialized area on the left medial lower leg is closed. The patient  has severe chronic venous insufficiency with stasis dermatitis in this area. Objective Constitutional Patient is hypertensive.. Pulse regular and within target range for patient.Marland Kitchen Respirations regular, non-labored and within target range.. Temperature is normal and within the target range for the patient.Marland Kitchen appears in no distress. Vitals Time Taken: 10:20 AM, Height: 68 in, Weight: 178 lbs, BMI: 27.1, Temperature: 98.4 F, Pulse: 74  bpm, Respiratory Rate: 20 breaths/min, Blood Pressure: 150/61 mmHg. Cardiovascular Pedal pulses are palpable. General Notes: Wound exam; everything is closed here is the area on the left medial lower leg is epithelialized. Calf circumference is down 2 cm which is all that it took to close this. 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 But he did tolerate the wrap. He does not have an arterial issue there is no evidence of infection Assessment Active Problems ICD-10 Chronic venous hypertension (idiopathic) with ulcer and inflammation of left lower extremity Non-pressure chronic ulcer of other part of left lower leg limited to breakdown of skin Joseph Graham, Joseph L. ([id number]) Procedures There was a Three Layer Compression Therapy Procedure by Dolan Amen, RN. Post procedure Diagnosis Wound #: Same as Pre-Procedure Notes: pt tolerating wrap well. Plan Follow-up Appointments: Return Appointment in 1 week. Bathing/ Shower/ Hygiene: May shower with wound dressing protected with water repellent cover or cast protector. Edema Control - Lymphedema / Segmental Compressive Device / Other: Optional: One layer of unna paste to top of compression wrap (to act as an anchor). 3 Layer Compression System for Lymphedema. - Silver alginate on weeping areas and ABD as secondary Elevate, Exercise Daily and Avoid Standing for Long Periods of Time. Elevate legs to the level of the heart and pump ankles as often as possible Elevate leg(s) parallel to the  floor when sitting. DO YOUR BEST to sleep in the bed at night. DO NOT sleep in your recliner. Long hours of sitting in a recliner leads to swelling of the legs and/or potential wounds on your backside. Additional Orders / Instructions: Follow Nutritious Diet and Increase Protein Intake Oxygen Administration: While patient is in clinic, provide supplemental oxygen via nasal cannula - 4L O2 1. I am putting the left leg back in 3 layer compression. 2. After discussing with the patient we are going to order Farrow wraps through [facility]. 3. Should be able to discharge him next week and external compression garments bilaterally 4. I went over the justification with this in some detail. He has stasis dermatitis venous insufficiency on the left leg without this we are going to have further skin breakdown. 5. Also advised to moisturize his legs with a skin moisturizer at least once daily usually nightly Electronic Signature(s) Signed: 10/26/2020 5:17:14 PM By: Linton Ham MD Entered By: Linton Ham on 10/26/2020 10:42:49 Joseph Graham ([id number]) -------------------------------------------------------------------------------- SuperBill Details Patient Name: Joseph Graham. Date of Service: 10/26/2020 Medical Record Number: [id number] Patient Account Number: [id number] Date of Birth/Sex: 1940-08-17 (80 y.o. M) Treating RN: Dolan Amen Primary Care Provider: [name] Other Clinician: Referring Provider: [name] Treating Provider/Extender: Tito Dine in Treatment: 1 Diagnosis Coding ICD-10 Codes Code Description [phone] Chronic venous hypertension (idiopathic) with ulcer and inflammation of left lower extremity L97.821 Non-pressure chronic ulcer of other part of left lower leg limited to breakdown of skin Facility Procedures CPT4 Code: [id number] Description: (Facility Use Only) [id number] - [place] LWR LT LEG Modifier: Quantity:  1 Physician Procedures CPT4 Code Description: [id number] - WC PHYS LEVEL 3 - EST PT Modifier: Quantity: 1 CPT4 Code Description: ICD-10 Diagnosis Description I87.332 Chronic venous hypertension (idiopathic) with ulcer and inflammation of le L97.821 Non-pressure chronic ulcer of other part of left lower leg limited to brea Modifier: ft lower extremity kdown of skin Quantity: Electronic Signature(s) Signed: 10/26/2020 5:17:14 PM By: Linton Ham MD Entered By: Linton Ham on 10/26/2020 10:43:06

## 2020-10-28 ENCOUNTER — Other Ambulatory Visit: Payer: Self-pay | Admitting: *Deleted

## 2020-10-28 ENCOUNTER — Ambulatory Visit (INDEPENDENT_AMBULATORY_CARE_PROVIDER_SITE_OTHER): Payer: Medicare Other | Admitting: Vascular Surgery

## 2020-10-28 ENCOUNTER — Encounter (INDEPENDENT_AMBULATORY_CARE_PROVIDER_SITE_OTHER): Payer: Self-pay

## 2020-10-28 MED ORDER — HYDROCODONE-ACETAMINOPHEN 5-325 MG PO TABS: 1.0000 | ORAL_TABLET | Freq: Four times a day (QID) | ORAL | 0 refills | Status: DC | PRN

## 2020-10-31 DIAGNOSIS — J9621 Acute and chronic respiratory failure with hypoxia: Secondary | ICD-10-CM | POA: Diagnosis not present

## 2020-10-31 DIAGNOSIS — J95811 Postprocedural pneumothorax: Secondary | ICD-10-CM | POA: Diagnosis not present

## 2020-10-31 DIAGNOSIS — R0600 Dyspnea, unspecified: Secondary | ICD-10-CM | POA: Diagnosis not present

## 2020-10-31 DIAGNOSIS — R0602 Shortness of breath: Secondary | ICD-10-CM | POA: Diagnosis not present

## 2020-10-31 DIAGNOSIS — J9611 Chronic respiratory failure with hypoxia: Secondary | ICD-10-CM | POA: Diagnosis not present

## 2020-11-01 ENCOUNTER — Other Ambulatory Visit: Payer: Self-pay | Admitting: Family Medicine

## 2020-11-01 DIAGNOSIS — R0989 Other specified symptoms and signs involving the circulatory and respiratory systems: Secondary | ICD-10-CM

## 2020-11-01 DIAGNOSIS — I872 Venous insufficiency (chronic) (peripheral): Secondary | ICD-10-CM

## 2020-11-02 ENCOUNTER — Other Ambulatory Visit: Payer: Self-pay

## 2020-11-02 ENCOUNTER — Encounter: Payer: Medicare Other | Admitting: Internal Medicine

## 2020-11-02 DIAGNOSIS — E11622 Type 2 diabetes mellitus with other skin ulcer: Secondary | ICD-10-CM | POA: Diagnosis not present

## 2020-11-02 DIAGNOSIS — J449 Chronic obstructive pulmonary disease, unspecified: Secondary | ICD-10-CM | POA: Diagnosis not present

## 2020-11-02 DIAGNOSIS — Z85118 Personal history of other malignant neoplasm of bronchus and lung: Secondary | ICD-10-CM | POA: Diagnosis not present

## 2020-11-02 DIAGNOSIS — L97821 Non-pressure chronic ulcer of other part of left lower leg limited to breakdown of skin: Secondary | ICD-10-CM | POA: Diagnosis not present

## 2020-11-02 DIAGNOSIS — I890 Lymphedema, not elsewhere classified: Secondary | ICD-10-CM | POA: Diagnosis not present

## 2020-11-02 DIAGNOSIS — I87332 Chronic venous hypertension (idiopathic) with ulcer and inflammation of left lower extremity: Secondary | ICD-10-CM | POA: Diagnosis not present

## 2020-11-02 DIAGNOSIS — E1142 Type 2 diabetes mellitus with diabetic polyneuropathy: Secondary | ICD-10-CM | POA: Diagnosis not present

## 2020-11-02 DIAGNOSIS — I872 Venous insufficiency (chronic) (peripheral): Secondary | ICD-10-CM | POA: Diagnosis not present

## 2020-11-02 DIAGNOSIS — I10 Essential (primary) hypertension: Secondary | ICD-10-CM | POA: Diagnosis not present

## 2020-11-02 DIAGNOSIS — Z9981 Dependence on supplemental oxygen: Secondary | ICD-10-CM | POA: Diagnosis not present

## 2020-11-03 ENCOUNTER — Other Ambulatory Visit: Payer: Self-pay | Admitting: Family Medicine

## 2020-11-03 NOTE — Progress Notes (Signed)
SAAD, BUHL ([id number]) Visit Report for 11/02/2020 HPI Details Patient Name: Joseph Graham, Joseph Graham. Date of Service: 11/02/2020 11:00 AM Medical Record Number: [id number] Patient Account Number: [id number] Date of Birth/Sex: 1940-08-22 (80 y.o. M) Treating RN: Primary Care Provider: [name] Other Clinician: Referring Provider: [name] Treating Provider/Extender: Tito Dine in Treatment: 2 History of Present Illness HPI Description: ADMISSION 10/19/2020 This is a patient referred [facility] for review of predominantly venous ulcers on his left leg. He is already had a noninvasive arterial review. He has had weeping and skin desquamation and blistering on the left leg for several months now. He was offered an Pension scheme manager at [facility] which she declined stating his neuropathy in his leg was too painful. He already has standard over the toe stockings that he does not wear. He has venous stasis in the left leg, bilateral pitting edema. He has had desquamation of the surface epithelium on a large area of the left anterior. Unfortunately without compression this is only going to get worse. Past medical history includes COPD, right upper lobe lung CA, chronic oxygen, type 2 diabetes with peripheral neuropathy Arterial; [facility] on 10/[facility]/2020; this showed an ABI in the right of 0.52 with biphasic and monophasic waveforms however his TBI was normal at 0.99. On the left he had an ABI of 0.87 with biphasic and monophasic waveforms but a great toe pressure almost normal at 0.68 5/18; the de-epithelialized area on the left medial lower leg is closed. The patient has severe chronic venous insufficiency with stasis dermatitis in this area. 5/25 everything is closed over here. The patient has chronic venous insufficiency stasis dermatitis and probably some degree of lymphedema. He has his Farrow wrap 4000 and we went over putting  these on today. He does not have an arterial issue Electronic Signature(s) Signed: 11/03/2020 8:39:06 AM By: Linton Ham MD Entered By: Linton Ham on 11/02/2020 12:35:40 Joseph Graham ([id number]) -------------------------------------------------------------------------------- Physical Exam Details Patient Name: Joseph Graham Date of Service: 11/02/2020 11:00 AM Medical Record Number: [id number] Patient Account Number: [id number] Date of Birth/Sex: 29-Aug-1940 (80 y.o. M) Treating RN: Primary Care Provider: [name] Other Clinician: Referring Provider: [name] Treating Provider/Extender: Tito Dine in Treatment: 2 Constitutional Patient is hypertensive.. Patient is hypertensive.. Pulse regular and within target range for patient.Marland Kitchen appears in no distress. Cardiovascular Pedal pulses are palpable. Chronic venous insufficiency with some degree of lymphedema. Thick skin fissured. Notes Wound exam; everything is closed here including the left medial lower leg. We have good edema control Electronic Signature(s) Signed: 11/03/2020 8:39:06 AM By: Linton Ham MD Entered By: Linton Ham on 11/02/2020 12:39:42 Joseph Graham ([id number]) -------------------------------------------------------------------------------- Physician Orders Details Patient Name: Joseph Graham Date of Service: 11/02/2020 11:00 AM Medical Record Number: [id number] Patient Account Number: [id number] Date of Birth/Sex: 30-Aug-1940 (80 y.o. M) Treating RN: Cornell Barman Primary Care Provider: [name] Other Clinician: Referring Provider: [name] Treating Provider/Extender: Tito Dine in Treatment: 2 Verbal / Phone Orders: No Diagnosis Coding Discharge From [facility] Services o Discharge from [place] [facility] Complete o Wear compression garments daily. Put garments on first thing when you wake up and remove them before bed. o  Moisturize legs daily after removing compression garments. o Elevate, Exercise Daily and Avoid Standing for Long Periods of Time. Electronic Signature(s) Signed: 11/03/2020 8:39:06 AM By: Linton Ham MD Signed: 11/03/2020 9:33:55 AM By: Gretta Cool, BSN, RN,  CWS, Kim RN, BSN Entered By: Gretta Cool, BSN, RN, CWS, Kim on 11/02/2020 11:54:52 Joseph Graham ([id number]) -------------------------------------------------------------------------------- Problem List Details Patient Name: Joseph Graham Date of Service: 11/02/2020 11:00 AM Medical Record Number: [id number] Patient Account Number: [id number] Date of Birth/Sex: Sep 21, 1940 (80 y.o. M) Treating RN: Primary Care Provider: [name] Other Clinician: Referring Provider: [name] Treating Provider/Extender: Tito Dine in Treatment: 2 Active Problems ICD-10 Encounter Code Description Active Date MDM Diagnosis I87.332 Chronic venous hypertension (idiopathic) with ulcer and inflammation of 10/19/2020 No Yes left lower extremity L97.821 Non-pressure chronic ulcer of other part of left lower leg limited to 10/19/2020 No Yes breakdown of skin Inactive Problems Resolved Problems Electronic Signature(s) Signed: 11/03/2020 8:39:06 AM By: Linton Ham MD Entered By: Linton Ham on 11/02/2020 12:34:56 Joseph Graham ([id number]) -------------------------------------------------------------------------------- Progress Note Details Patient Name: Joseph Graham. Date of Service: 11/02/2020 11:00 AM Medical Record Number: [id number] Patient Account Number: [id number] Date of Birth/Sex: 05/26/1941 (80 y.o. M) Treating RN: Primary Care Provider: [name] Other Clinician: Referring Provider: [name] Treating Provider/Extender: Tito Dine in Treatment: 2 Subjective History of Present Illness (HPI) ADMISSION 10/19/2020 This is a patient referred from [facility] for review  of predominantly venous ulcers on his left leg. He is already had a noninvasive arterial review. He has had weeping and skin desquamation and blistering on the left leg for several months now. He was offered an Pension scheme manager at [facility] which she declined stating his neuropathy in his leg was too painful. He already has standard over the toe stockings that he does not wear. He has venous stasis in the left leg, bilateral pitting edema. He has had desquamation of the surface epithelium on a large area of the left anterior. Unfortunately without compression this is only going to get worse. Past medical history includes COPD, right upper lobe lung CA, chronic oxygen, type 2 diabetes with peripheral neuropathy Arterial; [facility]/2020; this showed an ABI in the right of 0.52 with biphasic and monophasic waveforms however his TBI was normal at 0.99. On the left he had an ABI of 0.87 with biphasic and monophasic waveforms but a great toe pressure almost normal at 0.68 5/18; the de-epithelialized area on the left medial lower leg is closed. The patient has severe chronic venous insufficiency with stasis dermatitis in this area. 5/25 everything is closed over here. The patient has chronic venous insufficiency stasis dermatitis and probably some degree of lymphedema. He has his Farrow wrap 4000 and we went over putting these on today. He does not have an arterial issue Objective Constitutional Patient is hypertensive.. Patient is hypertensive.. Pulse regular and within target range for patient.Marland Kitchen appears in no distress. Vitals Time Taken: 11:29 AM, Height: 68 in, Weight: 178 lbs, BMI: 27.1, Temperature: 98.3 F, Pulse: 66 bpm, Respiratory Rate: 20 breaths/min, Blood Pressure: 178/65 mmHg. Cardiovascular Pedal pulses are palpable. Chronic venous insufficiency with some degree of lymphedema. Thick skin fissured. General Notes: Wound exam; everything is closed here  including the left medial lower leg. We have good edema control Assessment Active Problems ICD-10 Chronic venous hypertension (idiopathic) with ulcer and inflammation of left lower extremity Non-pressure chronic ulcer of other part of left lower leg limited to breakdown of skin Plan WALTHER, Joseph Graham ([id number]) Discharge From [facility] Services: Discharge from [place] [facility] Complete Wear compression garments daily. Put garments on first thing when you wake up and  remove them before bed. Moisturize legs daily after removing compression garments. Elevate, Exercise Daily and Avoid Standing for Long Periods of Time. 1. The patient can be discharged from the clinic to his own [place] wraps 2. Went over lotion in his legs at night. 3. A leg elevation and exercise such as walking also emphasized Electronic Signature(s) Signed: 11/03/2020 8:39:06 AM By: Linton Ham MD Entered By: Linton Ham on 11/02/2020 12:40:14 Joseph Graham ([id number]) -------------------------------------------------------------------------------- SuperBill Details Patient Name: Joseph Graham. Date of Service: 11/02/2020 Medical Record Number: [id number] Patient Account Number: [id number] Date of Birth/Sex: Dec 05, 1940 (80 y.o. M) Treating RN: Primary Care Provider: [name] Other Clinician: Referring Provider: [name] Treating Provider/Extender: Tito Dine in Treatment: 2 Diagnosis Coding ICD-10 Codes Code Description [phone] Chronic venous hypertension (idiopathic) with ulcer and inflammation of left lower extremity L97.821 Non-pressure chronic ulcer of other part of left lower leg limited to breakdown of skin Facility Procedures CPT4 Code: [id number] Description: [phone] - WOUND CARE VISIT-LEV 2 EST PT Modifier: Quantity: 1 Physician Procedures CPT4 Code Description: [id number] 99213 - WC PHYS LEVEL 3 - EST PT Modifier: Quantity: 1 CPT4 Code Description: ICD-10  Diagnosis Description I87.332 Chronic venous hypertension (idiopathic) with ulcer and inflammation of le L97.821 Non-pressure chronic ulcer of other part of left lower leg limited to brea Modifier: ft lower extremity kdown of skin Quantity: Electronic Signature(s) Signed: 11/03/2020 8:39:06 AM By: Linton Ham MD Entered By: Linton Ham on 11/02/2020 12:40:29

## 2020-11-03 NOTE — Progress Notes (Signed)
Joseph, Graham ([id number]) Visit Report for 11/02/2020 Arrival Information Details Patient Name: Joseph Graham, Joseph Graham. Date of Service: 11/02/2020 11:00 AM Medical Record Number: [id number] Patient Account Number: [id number] Date of Birth/Sex: Mar 07, 1941 (80 y.o. M) Treating RN: Carlene Coria Primary Care Jennier Schissler: [name] Other Clinician: Referring Jalene Lacko: [name] Treating [place] Searles/Extender: Tito Dine in Treatment: 2 Visit Information History Since Last Visit All ordered tests and consults were completed: No Patient Arrived: Wheel Chair Added or deleted any medications: No Arrival Time: 11:18 Any new allergies or adverse reactions: No Accompanied By: self Had a fall or experienced change in No Transfer Assistance: None activities of daily living that may affect Patient Identification Verified: Yes risk of falls: Secondary Verification Process Completed: Yes Signs or symptoms of abuse/neglect since last visito No Patient Requires Transmission-Based No Hospitalized since last visit: No Precautions: Implantable device outside of the clinic excluding No Patient Has Alerts: Yes cellular tissue based products placed in the center Patient Alerts: Type II Diabetic since last visit: TBI 09/30/20 L 0.68 R Has Dressing in Place as Prescribed: Yes 0.99 Has Compression in Place as Prescribed: Yes Pain Present Now: No Electronic Signature(s) Signed: 11/02/2020 4:53:29 PM By: Carlene Coria RN Entered By: Carlene Coria on 11/02/2020 11:29:45 Joseph Graham ([id number]) -------------------------------------------------------------------------------- Clinic Level of Care Assessment Details Patient Name: Joseph Graham. Date of Service: 11/02/2020 11:00 AM Medical Record Number: [id number] Patient Account Number: [id number] Date of Birth/Sex: 10-01-1940 (80 y.o. M) Treating RN: Cornell Barman Primary Care Novaleigh Kohlman: [name] Other Clinician: Referring  Kateryna Grantham: [name] Treating Zaydenn Balaguer/Extender: Tito Dine in Treatment: 2 Clinic Level of Care Assessment Items TOOL 4 Quantity Score []  - Use when only an EandM is performed on FOLLOW-UP visit 0 ASSESSMENTS - Nursing Assessment / Reassessment X - Reassessment of Co-morbidities (includes updates in patient status) 1 10 X- 1 5 Reassessment of Adherence to Treatment Plan ASSESSMENTS - Wound and Skin Assessment / Reassessment []  - Simple Wound Assessment / Reassessment - one wound 0 []  - 0 Complex Wound Assessment / Reassessment - multiple wounds []  - 0 Dermatologic / Skin Assessment (not related to wound area) ASSESSMENTS - Focused Assessment X - Circumferential Edema Measurements - multi extremities 1 5 []  - 0 Nutritional Assessment / Counseling / Intervention []  - 0 Lower Extremity Assessment (monofilament, tuning fork, pulses) []  - 0 Peripheral Arterial Disease Assessment (using hand held doppler) ASSESSMENTS - Ostomy and/or Continence Assessment and Care []  - Incontinence Assessment and Management 0 []  - 0 Ostomy Care Assessment and Management (repouching, etc.) PROCESS - Coordination of Care X - Simple Patient / Family Education for ongoing care 1 15 []  - 0 Complex (extensive) Patient / Family Education for ongoing care []  - 0 Staff obtains Programmer, systems, Records, Test Results / Process Orders []  - 0 Staff telephones HHA, Nursing Homes / Clarify orders / etc []  - 0 Routine Transfer to another Facility (non-emergent condition) []  - 0 Routine Hospital Admission (non-emergent condition) []  - 0 New Admissions / Biomedical engineer / Ordering NPWT, Apligraf, etc. []  - 0 Emergency Hospital Admission (emergent condition) X- 1 10 Simple Discharge Coordination []  - 0 Complex (extensive) Discharge Coordination PROCESS - Special Needs []  - Pediatric / Minor Patient Management 0 []  - 0 Isolation Patient Management []  - 0 Hearing / Language / Visual  special needs []  - 0 Assessment of Community assistance (transportation, D/C planning, etc.) []  - 0 Additional assistance / Altered mentation []  - 0 Support Surface(s) Assessment (bed,  cushion, seat, etc.) INTERVENTIONS - Wound Cleansing / Measurement MASON, DIBIASIO. ([id number]) []  - 0 Simple Wound Cleansing - one wound []  - 0 Complex Wound Cleansing - multiple wounds []  - 0 Wound Imaging (photographs - any number of wounds) []  - 0 Wound Tracing (instead of photographs) []  - 0 Simple Wound Measurement - one wound []  - 0 Complex Wound Measurement - multiple wounds INTERVENTIONS - Wound Dressings []  - Small Wound Dressing one or multiple wounds 0 []  - 0 Medium Wound Dressing one or multiple wounds []  - 0 Large Wound Dressing one or multiple wounds []  - 0 Application of Medications - topical []  - 0 Application of Medications - injection INTERVENTIONS - Miscellaneous []  - External ear exam 0 []  - 0 Specimen Collection (cultures, biopsies, blood, body fluids, etc.) []  - 0 Specimen(s) / Culture(s) sent or taken to Lab for analysis []  - 0 Patient Transfer (multiple staff / Civil Service fast streamer / Similar devices) []  - 0 Simple Staple / Suture removal (25 or less) []  - 0 Complex Staple / Suture removal (26 or more) []  - 0 Hypo / Hyperglycemic Management (close monitor of Blood Glucose) []  - 0 Ankle / Brachial Index (ABI) - do not check if billed separately X- 1 5 Vital Signs Has the patient been seen at the hospital within the last three years: Yes Total Score: 50 Level Of Care: New/Established - Level 2 Electronic Signature(s) Signed: 11/03/2020 9:33:55 AM By: Gretta Cool, BSN, RN, CWS, Kim RN, BSN Entered By: Gretta Cool, BSN, RN, CWS, Kim on 11/02/2020 11:55:18 Joseph Graham ([id number]) -------------------------------------------------------------------------------- Encounter Discharge Information Details Patient Name: Joseph Graham. Date of Service: 11/02/2020 11:00  AM Medical Record Number: [id number] Patient Account Number: [id number] Date of Birth/Sex: 12-18-1940 (80 y.o. M) Treating RN: Cornell Barman Primary Care Collie Wernick: [name] Other Clinician: Referring Rasheedah Reis: [name] Treating [place] Schorr/Extender: Tito Dine in Treatment: 2 Encounter Discharge Information Items Discharge Condition: Stable Ambulatory Status: Wheelchair Discharge Destination: Home Transportation: Other Accompanied By: wife Schedule Follow-up Appointment: No Clinical Summary of Care: Electronic Signature(s) Signed: 11/03/2020 9:33:55 AM By: Gretta Cool, BSN, RN, CWS, Kim RN, BSN Entered By: Gretta Cool, BSN, RN, CWS, Kim on 11/02/2020 11:56:14 Joseph Graham ([id number]) -------------------------------------------------------------------------------- Lower Extremity Assessment Details Patient Name: REDELL, NAZIR. Date of Service: 11/02/2020 11:00 AM Medical Record Number: [id number] Patient Account Number: [id number] Date of Birth/Sex: May 10, 1941 (80 y.o. M) Treating RN: Carlene Coria Primary Care Aaliayah Miao: [name] Other Clinician: Referring Eliah Marquard: [name] Treating Laiylah Roettger/Extender: Tito Dine in Treatment: 2 Edema Assessment Assessed: [Left: No] [Right: No] [Left: Edema] [Right: :] Calf Left: Right: Point of Measurement: 29 cm From Medial Instep 33 cm 37 cm Ankle Left: Right: Point of Measurement: 11 cm From Medial Instep 24 cm 24 cm Vascular Assessment Pulses: Dorsalis Pedis Palpable: [Left:Yes] [Right:Yes] Electronic Signature(s) Signed: 11/02/2020 4:53:29 PM By: Carlene Coria RN Entered By: Carlene Coria on 11/02/2020 11:42:13 Joseph Graham ([id number]) -------------------------------------------------------------------------------- Multi Wound Chart Details Patient Name: Joseph Graham. Date of Service: 11/02/2020 11:00 AM Medical Record Number: [id number] Patient Account Number: [id number] Date  of Birth/Sex: 06-19-1940 (80 y.o. M) Treating RN: Cornell Barman Primary Care Anetha Slagel: [name] Other Clinician: Referring Johnny Latu: [name] Treating Norrin Shreffler/Extender: Tito Dine in Treatment: 2 Vital Signs Height(in): 68 Pulse(bpm): 1 Weight(lbs): 178 Blood Pressure(mmHg): 178/65 Body Mass Index(BMI): 27 Temperature(F): 98.3 Respiratory Rate(breaths/min): 20 Wound Assessments Treatment Notes Electronic Signature(s) Signed: 11/03/2020 9:33:55 AM By: Gretta Cool, BSN, RN, CWS, Kim RN, BSN Entered  By: Gretta Cool, BSN, RN, CWS, Kim on 11/02/2020 11:54:15 Joseph Graham ([id number]) -------------------------------------------------------------------------------- [place] Details Patient Name: KENO, CARAWAY. Date of Service: 11/02/2020 11:00 AM Medical Record Number: [id number] Patient Account Number: [id number] Date of Birth/Sex: 1941/04/27 (80 y.o. M) Treating RN: Cornell Barman Primary Care Moselle Rister: [name] Other Clinician: Referring Nicholette Dolson: [name] Treating Brant Peets/Extender: Tito Dine in Treatment: 2 Active Inactive Electronic Signature(s) Signed: 11/03/2020 9:33:55 AM By: Gretta Cool, BSN, RN, CWS, Kim RN, BSN Entered By: Gretta Cool, BSN, RN, CWS, Kim on 11/02/2020 11:53:58 Joseph Graham ([id number]) -------------------------------------------------------------------------------- Pain Assessment Details Patient Name: Joseph Graham Date of Service: 11/02/2020 11:00 AM Medical Record Number: [id number] Patient Account Number: [id number] Date of Birth/Sex: 03-23-1941 (80 y.o. M) Treating RN: Carlene Coria Primary Care Koa Zoeller: [name] Other Clinician: Referring Carmelite Violet: [name] Treating Ivery Nanney/Extender: Tito Dine in Treatment: 2 Active Problems Location of Pain Severity and Description of Pain Patient Has Paino No Site Locations Pain Management and  Medication Current Pain Management: Electronic Signature(s) Signed: 11/02/2020 4:53:29 PM By: Carlene Coria RN Entered By: Carlene Coria on 11/02/2020 11:31:24 Joseph Graham ([id number]) -------------------------------------------------------------------------------- Patient/Caregiver Education Details Patient Name: Joseph Graham. Date of Service: 11/02/2020 11:00 AM Medical Record Number: [id number] Patient Account Number: [id number] Date of Birth/Gender: 08-Dec-1940 (80 y.o. M) Treating RN: Cornell Barman Primary Care Physician: [name] Other Clinician: Referring Physician: [name] Treating Physician/Extender: Tito Dine in Treatment: 2 Education Assessment Education Provided To: Patient Education Topics Provided Venous: Handouts: Controlling Swelling with Compression Stockings Methods: Demonstration Responses: State content correctly Electronic Signature(s) Signed: 11/03/2020 9:33:55 AM By: Gretta Cool, BSN, RN, CWS, Kim RN, BSN Entered By: Gretta Cool, BSN, RN, CWS, Kim on 11/02/2020 11:55:38 Joseph Graham ([id number]) -------------------------------------------------------------------------------- [place] Details Patient Name: Joseph Graham Date of Service: 11/02/2020 11:00 AM Medical Record Number: [id number] Patient Account Number: [id number] Date of Birth/Sex: [birth year] (80 y.o. M) Treating RN: Carlene Coria Primary Care Zackry Deines: [name] Other Clinician: Referring Vincentina Sollers: [name] Treating Elizandro Laura/Extender: Tito Dine in Treatment: 2 Vital Signs Time Taken: 11:29 Temperature (F): 98.3 Height (in): 68 Pulse (bpm): 66 Weight (lbs): 178 Respiratory Rate (breaths/min): 20 Body Mass Index (BMI): 27.1 Blood Pressure (mmHg): 178/65 Reference Range: 80 - 120 mg / dl Electronic Signature(s) Signed: 11/02/2020 4:53:29 PM By: Carlene Coria RN Entered By: Carlene Coria on 11/02/2020 11:30:15

## 2020-11-08 ENCOUNTER — Telehealth: Payer: Self-pay

## 2020-11-08 NOTE — Telephone Encounter (Signed)
Patient declined upcoming arterial exam.  Cancelled visit and removed order per patient request. He states he has already had this exam done.

## 2020-11-11 ENCOUNTER — Inpatient Hospital Stay: Payer: Medicare Other | Attending: Hospice and Palliative Medicine | Admitting: Hospice and Palliative Medicine

## 2020-11-11 DIAGNOSIS — C3411 Malignant neoplasm of upper lobe, right bronchus or lung: Secondary | ICD-10-CM

## 2020-11-11 DIAGNOSIS — G893 Neoplasm related pain (acute) (chronic): Secondary | ICD-10-CM

## 2020-11-11 DIAGNOSIS — Z515 Encounter for palliative care: Secondary | ICD-10-CM

## 2020-11-11 MED ORDER — HYDROCODONE-ACETAMINOPHEN 5-325 MG PO TABS: 1.0000 | ORAL_TABLET | Freq: Four times a day (QID) | ORAL | 0 refills | Status: DC | PRN

## 2020-11-11 NOTE — Progress Notes (Signed)
Virtual Visit via Telephone Note  I connected with Joseph Graham on 11/11/20 at  2:00 PM EDT by telephone and verified that I am speaking with the correct person using two identifiers.  Location: Patient: home Provider: clinic   I discussed the limitations, risks, security and privacy concerns of performing an evaluation and management service by telephone and the availability of in person appointments. I also discussed with the patient that there may be a patient responsible charge related to this service. The patient expressed understanding and agreed to proceed.   History of Present Illness: [name] is a 80 year old man with multiple medical problems including O2 dependent COPD, CAD, PAD, peripheral neuropathy, and lumbar stenosis with chronic back pain.  Additionally, patient has stage II lung cancer with CT of the chest on 02/24/2020 revealing interval growth and pulmonary nodules.    Patient is currently receiving XRT.  However, he has had difficulty tolerating XRT treatments due to persistent back pain. He is referred to palliative care to address goals and manage ongoing symptoms.   Observations/Objective:  Patient reports he is doing well.  Denies significant changes or concerns.  Pain is stable on Lidoderm patches with use of as needed Norco, which patient is averaging 3-4 times a day.  Patient did not start the fentanyl as it was cost prohibitive.  It does not sound like insurance would cover the cost.  Patient is in agreement with continuing Lidoderm/Norco for now.  Patient does describe some daytime fatigue.  Likely associated with his comorbidities including COPD.  Assessment and Plan: Lung cancer -s/p XRT.  Now on surveillance. CT 4/25 revealed slight progressive nodal disease with recommendation for follow-up CT in 3 months.  Patient is pending PET on 7/13.  Patient followed by Dr. Rogue Bussing  Chronic low back pain -continue Norco 5-325mg  Q6H PRN #90. Continue  Lidoderm 5% patches to back.  PDMP reviewed.   Follow Up Instructions: Follow-up MyChart visit with me in 1 month   I discussed the assessment and treatment plan with the patient. The patient was provided an opportunity to ask questions and all were answered. The patient agreed with the plan and demonstrated an understanding of the instructions.   The patient was advised to call back or seek an in-person evaluation if the symptoms worsen or if the condition fails to improve as anticipated.  I provided 15 minutes of non-face-to-face time during this encounter.   Irean Hong, NP

## 2020-11-16 ENCOUNTER — Encounter: Payer: Medicare Other | Attending: Internal Medicine | Admitting: Internal Medicine

## 2020-11-16 ENCOUNTER — Other Ambulatory Visit: Payer: Self-pay

## 2020-11-16 DIAGNOSIS — L97812 Non-pressure chronic ulcer of other part of right lower leg with fat layer exposed: Secondary | ICD-10-CM | POA: Diagnosis not present

## 2020-11-16 DIAGNOSIS — I890 Lymphedema, not elsewhere classified: Secondary | ICD-10-CM | POA: Diagnosis not present

## 2020-11-16 DIAGNOSIS — E11622 Type 2 diabetes mellitus with other skin ulcer: Secondary | ICD-10-CM | POA: Insufficient documentation

## 2020-11-16 DIAGNOSIS — E1142 Type 2 diabetes mellitus with diabetic polyneuropathy: Secondary | ICD-10-CM | POA: Insufficient documentation

## 2020-11-16 DIAGNOSIS — Z85118 Personal history of other malignant neoplasm of bronchus and lung: Secondary | ICD-10-CM | POA: Diagnosis not present

## 2020-11-16 DIAGNOSIS — J449 Chronic obstructive pulmonary disease, unspecified: Secondary | ICD-10-CM | POA: Insufficient documentation

## 2020-11-16 DIAGNOSIS — Z9981 Dependence on supplemental oxygen: Secondary | ICD-10-CM | POA: Insufficient documentation

## 2020-11-16 DIAGNOSIS — I872 Venous insufficiency (chronic) (peripheral): Secondary | ICD-10-CM | POA: Diagnosis not present

## 2020-11-16 DIAGNOSIS — L97821 Non-pressure chronic ulcer of other part of left lower leg limited to breakdown of skin: Secondary | ICD-10-CM | POA: Diagnosis not present

## 2020-11-16 DIAGNOSIS — I10 Essential (primary) hypertension: Secondary | ICD-10-CM | POA: Insufficient documentation

## 2020-11-17 NOTE — Progress Notes (Signed)
SILUS, LANZO ([id number]) Visit Report for 11/16/2020 Arrival Information Details Patient Name: Joseph Graham, Joseph Graham. Date of Service: 11/16/2020 2:45 PM Medical Record Number: [id number] Patient Account Number: [id number] Date of Birth/Sex: 29-May-1941 (80 y.o. M) Treating RN: Donnamarie Poag Primary Care Karma Hiney: [name] Other Clinician: Referring Paquita Printy: [name] Treating Zandyr Barnhill/Extender: Tito Dine in Treatment: 4 Visit Information History Since Last Visit Added or deleted any medications: No Patient Arrived: Wheel Chair Had a fall or experienced change in No Arrival Time: 14:53 activities of daily living that may affect Accompanied By: wife risk of falls: Transfer Assistance: None Hospitalized since last visit: No Patient Identification Verified: Yes Has Dressing in Place as Prescribed: Yes Secondary Verification Process Completed: Yes Pain Present Now: Yes Patient Requires Transmission-Based No Precautions: Patient Has Alerts: Yes Patient Alerts: Type II Diabetic TBI 09/30/20 L 0.68 R 0.99 Electronic Signature(s) Signed: 11/17/2020 11:06:31 AM By: Donnamarie Poag Entered By: Donnamarie Poag on 11/16/2020 14:54:09 Joseph Graham ([id number]) -------------------------------------------------------------------------------- Compression Therapy Details Patient Name: Joseph Graham. Date of Service: 11/16/2020 2:45 PM Medical Record Number: [id number] Patient Account Number: [id number] Date of Birth/Sex: 12/02/1940 (80 y.o. M) Treating RN: Cornell Barman Primary Care Morgyn Marut: [name] Other Clinician: Referring Culley Hedeen: [name] Treating Aizen Duval/Extender: Tito Dine in Treatment: 4 Compression Therapy Performed for Wound Assessment: Wound #1 Right,Distal,Anterior Lower Leg Performed By: Clinician Cornell Barman, RN Compression Type: Three Layer Post Procedure Diagnosis Same as Pre-procedure Notes TBI 0.99 Electronic  Signature(s) Signed: 11/16/2020 4:36:28 PM By: Gretta Cool, BSN, RN, CWS, Kim RN, BSN Entered By: Gretta Cool, BSN, RN, CWS, Kim on 11/16/2020 15:21:04 Joseph Graham ([id number]) -------------------------------------------------------------------------------- Encounter Discharge Information Details Patient Name: Joseph Graham Date of Service: 11/16/2020 2:45 PM Medical Record Number: [id number] Patient Account Number: [id number] Date of Birth/Sex: [birth year] (80 y.o. M) Treating RN: Dolan Amen Primary Care Kelilah Hebard: [name] Other Clinician: Referring Lorine Iannaccone: [name] Treating Nefertiti Mohamad/Extender: Tito Dine in Treatment: 4 Encounter Discharge Information Items Discharge Condition: Stable Ambulatory Status: Wheelchair Discharge Destination: Home Transportation: Private Auto Accompanied By: wife Schedule Follow-up Appointment: Yes Clinical Summary of Care: Electronic Signature(s) Signed: 11/16/2020 3:37:08 PM By: Georges Mouse, Minus Breeding RN Entered By: Georges Mouse, Minus Breeding on 11/16/2020 15:37:08 Joseph Graham ([id number]) -------------------------------------------------------------------------------- Lower Extremity Assessment Details Patient Name: Joseph Graham. Date of Service: 11/16/2020 2:45 PM Medical Record Number: [id number] Patient Account Number: [id number] Date of Birth/Sex: 1941/01/13 (80 y.o. M) Treating RN: Donnamarie Poag Primary Care Trinisha Paget: [name] Other Clinician: Referring Dominic Mahaney: [name] Treating Matsue Strom/Extender: Tito Dine in Treatment: 4 Edema Assessment Assessed: [Left: No] [Right: Yes] Edema: [Left: Ye] [Right: s] Calf Left: Right: Point of Measurement: 29 cm From Medial Instep 34.5 cm Ankle Left: Right: Point of Measurement: 11 cm From Medial Instep 23.5 cm Knee To Floor Left: Right: From Medial Instep 43 cm Vascular Assessment Pulses: Dorsalis Pedis Palpable:  [Right:Yes] Electronic Signature(s) Signed: 11/17/2020 11:06:31 AM By: Donnamarie Poag Entered By: Donnamarie Poag on 11/16/2020 15:04:08 Joseph Graham ([id number]) -------------------------------------------------------------------------------- Multi Wound Chart Details Patient Name: Joseph Graham. Date of Service: 11/16/2020 2:45 PM Medical Record Number: [id number] Patient Account Number: [id number] Date of Birth/Sex: 1941-02-22 (80 y.o. M) Treating RN: Cornell Barman Primary Care Duglas Heier: [name] Other Clinician: Referring Ileanna Gemmill: [name] Treating Gustie Bobb/Extender: Tito Dine in Treatment: 4 Vital Signs Height(in): 68 Pulse(bpm): 83 Weight(lbs): 178 Blood Pressure(mmHg): 165/65 Body Mass Index(BMI): 27 Temperature(F): 98.3 Respiratory Rate(breaths/min): 20 Photos: [N/A:N/A] Wound Location: Right, Distal, Anterior  Lower Leg N/A N/A Wounding Event: Gradually Appeared N/A N/A Primary Etiology: Lymphedema N/A N/A Comorbid History: Cataracts, Lymphedema, Chronic N/A N/A Obstructive Pulmonary Disease (COPD), Coronary Artery Disease, Hypertension, Peripheral Arterial Disease, Type II Diabetes, Osteoarthritis, Neuropathy, Received Chemotherapy, Received Radiation Date Acquired: 11/02/2020 N/A N/A Weeks of Treatment: 0 N/A N/A Wound Status: Open N/A N/A Measurements L x W x D (cm) 5.2x4x0.1 N/A N/A Area (cm) : 16.336 N/A N/A Volume (cm) : 1.634 N/A N/A Classification: Full Thickness Without Exposed N/A N/A Support Structures Exudate Amount: Medium N/A N/A Exudate Type: Serous N/A N/A Exudate Color: amber N/A N/A Granulation Amount: Large (67-100%) N/A N/A Granulation Quality: Pink N/A N/A Necrotic Amount: Small (1-33%) N/A N/A Exposed Structures: Fat Layer (Subcutaneous Tissue): N/A N/A Yes Fascia: No Tendon: No Muscle: No Joint: No Bone: No Treatment Notes Electronic Signature(s) Signed: 11/16/2020 4:36:28 PM By: Gretta Cool, BSN, RN, CWS,  Kim RN, BSN Entered By: Gretta Cool, BSN, RN, CWS, Kim on 11/16/2020 15:17:45 Joseph Graham ([id number]) -------------------------------------------------------------------------------- [place] Details Patient Name: Joseph Graham. Date of Service: 11/16/2020 2:45 PM Medical Record Number: [id number] Patient Account Number: [id number] Date of Birth/Sex: July 12, 1940 (80 y.o. M) Treating RN: Cornell Barman Primary Care Riccardo Holeman: [name] Other Clinician: Referring Taequan Stockhausen: [name] Treating Akul Leggette/Extender: Tito Dine in Treatment: 4 Active Inactive Venous Leg Ulcer Nursing Diagnoses: Actual venous Insuffiency (use after diagnosis is confirmed) Goals: Patient will maintain optimal edema control Date Initiated: 10/19/2020 Target Resolution Date: 11/02/2020 Goal Status: Active Verify adequate tissue perfusion prior to therapeutic compression application Date Initiated: 10/19/2020 Target Resolution Date: 11/02/2020 Goal Status: Active Interventions: Assess peripheral edema status every visit. Treatment Activities: Non-invasive vascular studies : 10/19/2020 Therapeutic compression applied : 10/19/2020 Notes: Electronic Signature(s) Signed: 11/16/2020 4:36:28 PM By: Gretta Cool, BSN, RN, CWS, Kim RN, BSN Entered By: Gretta Cool, BSN, RN, CWS, Kim on 11/16/2020 15:17:36 Joseph Graham ([id number]) -------------------------------------------------------------------------------- Pain Assessment Details Patient Name: Joseph Graham Date of Service: 11/16/2020 2:45 PM Medical Record Number: [id number] Patient Account Number: [id number] Date of Birth/Sex: [birth year] (80 y.o. M) Treating RN: Donnamarie Poag Primary Care Maudie Shingledecker: [name] Other Clinician: Referring Kevontae Burgoon: [name] Treating Veyda Kaufman/Extender: Tito Dine in Treatment: 4 Active Problems Location of Pain Severity and Description of Pain Patient Has Paino  Yes Site Locations Pain Location: Pain in Ulcers Rate the pain. Current Pain Level: 3 Pain Management and Medication Current Pain Management: Electronic Signature(s) Signed: 11/17/2020 11:06:31 AM By: Donnamarie Poag Entered By: Donnamarie Poag on 11/16/2020 15:00:03 Joseph Graham ([id number]) -------------------------------------------------------------------------------- Patient/Caregiver Education Details Patient Name: Joseph Graham Date of Service: 11/16/2020 2:45 PM Medical Record Number: [id number] Patient Account Number: [id number] Date of Birth/Gender: 1940-11-19 (80 y.o. M) Treating RN: Cornell Barman Primary Care Physician: [name] Other Clinician: Referring Physician: [name] Treating Physician/Extender: Tito Dine in Treatment: 4 Education Assessment Education Provided To: Patient Education Topics Provided Venous: Handouts: Controlling Swelling with Multilayered Compression Wraps Methods: Demonstration, Explain/Verbal Responses: State content correctly Wound/Skin Impairment: Handouts: Caring for Your Ulcer Methods: Demonstration, Explain/Verbal Responses: State content correctly Electronic Signature(s) Signed: 11/16/2020 4:36:28 PM By: Gretta Cool, BSN, RN, CWS, Kim RN, BSN Entered By: Gretta Cool, BSN, RN, CWS, Kim on 11/16/2020 15:20:30 Joseph Graham ([id number]) -------------------------------------------------------------------------------- Wound Assessment Details Patient Name: Joseph Graham. Date of Service: 11/16/2020 2:45 PM Medical Record Number: [id number] Patient Account Number: [id number] Date of Birth/Sex: 1940/06/24 (80 y.o. M) Treating RN: Donnamarie Poag Primary Care Durrel Mcnee: [name] Other Clinician: Referring Gillie Fleites: [name]  Treating Charyl Minervini/Extender: Ricard Dillon Weeks in Treatment: 4 Wound Status Wound Number: 1 Primary Lymphedema Etiology: Wound Location: Right, Distal, Anterior Lower Leg Wound  Open Wounding Event: Gradually Appeared Status: Date Acquired: 11/02/2020 Comorbid Cataracts, Lymphedema, Chronic Obstructive Pulmonary Weeks Of Treatment: 0 History: Disease (COPD), Coronary Artery Disease, Hypertension, Clustered Wound: No Peripheral Arterial Disease, Type II Diabetes, Osteoarthritis, Neuropathy, Received Chemotherapy, Received Radiation Photos Wound Measurements Length: (cm) 5.2 Width: (cm) 4 Depth: (cm) 0.1 Area: (cm) 16.336 Volume: (cm) 1.634 % Reduction in Area: % Reduction in Volume: Tunneling: No Undermining: No Wound Description Classification: Full Thickness Without Exposed Support Structures Exudate Amount: Medium Exudate Type: Serous Exudate Color: amber Foul Odor After Cleansing: No Slough/Fibrino Yes Wound Bed Granulation Amount: Large (67-100%) Exposed Structure Granulation Quality: Pink Fascia Exposed: No Necrotic Amount: Small (1-33%) Fat Layer (Subcutaneous Tissue) Exposed: Yes Necrotic Quality: Adherent Slough Tendon Exposed: No Muscle Exposed: No Joint Exposed: No Bone Exposed: No Treatment Notes Wound #1 (Lower Leg) Wound Laterality: Right, Anterior, Distal Cleanser Peri-Wound Care Topical Dobrowolski, Hamed L. ([id number]) Primary Dressing Silvercel 4 1/4x 4 1/4 (in/in) Discharge Instruction: Apply Silvercel 4 1/4x 4 1/4 (in/in) as instructed Secondary Dressing ABD Pad 5x9 (in/in) Discharge Instruction: Cover with ABD pad Secured With Compression Wrap Profore Lite LF 3 Multilayer Compression Bandaging System Discharge Instruction: Apply 3 multi-layer wrap as prescribed. Compression Stockings Add-Ons Electronic Signature(s) Signed: 11/17/2020 11:06:31 AM By: Donnamarie Poag Entered By: Donnamarie Poag on 11/16/2020 15:02:15 Joseph Graham ([id number]) -------------------------------------------------------------------------------- Vitals Details Patient Name: Joseph Graham Date of Service: 11/16/2020 2:45 PM Medical Record  Number: [id number] Patient Account Number: [id number] Date of Birth/Sex: December 03, 1940 (80 y.o. M) Treating RN: Donnamarie Poag Primary Care Jair Lindblad: [name] Other Clinician: Referring Makisha Marrin: [name] Treating Marri Mcneff/Extender: Tito Dine in Treatment: 4 Vital Signs Time Taken: 14:55 Temperature (F): 98.3 Height (in): 68 Pulse (bpm): 59 Weight (lbs): 178 Respiratory Rate (breaths/min): 20 Body Mass Index (BMI): 27.1 Blood Pressure (mmHg): 165/65 Reference Range: 80 - 120 mg / dl Electronic Signature(s) Signed: 11/17/2020 11:06:31 AM By: Donnamarie Poag Entered ByDonnamarie Poag on 11/16/2020 14:57:08

## 2020-11-17 NOTE — Progress Notes (Signed)
Joseph, Graham ([id number]) Visit Report for 11/16/2020 HPI Details Patient Name: Joseph Graham, Joseph Graham. Date of Service: 11/16/2020 2:45 PM Medical Record Number: [id number] Patient Account Number: [id number] Date of Birth/Sex: [birth year] (80 y.o. M) Treating RN: Cornell Barman Primary Care Provider: [name] Other Clinician: Referring Provider: [name] Treating Provider/Extender: Tito Dine in Treatment: 4 History of Present Illness HPI Description: ADMISSION 10/19/2020 This is a patient [facility] for review of predominantly venous ulcers on his left leg. He is already had a noninvasive arterial review. He has had weeping and skin desquamation and blistering on the left leg for several months now. He was offered an Pension scheme manager at [facility] which she declined stating his neuropathy in his leg was too painful. He already has standard over the toe stockings that he does not wear. He has venous stasis in the left leg, bilateral pitting edema. He has had desquamation of the surface epithelium on a large area of the left anterior. Unfortunately without compression this is only going to get worse. Past medical history includes COPD, right upper lobe lung CA, chronic oxygen, type 2 diabetes with peripheral neuropathy Arterial; [facility] on [facility]/2020; this showed an ABI in the right of 0.52 with biphasic and monophasic waveforms however his TBI was normal at 0.99. On the left he had an ABI of 0.87 with biphasic and monophasic waveforms but a great toe pressure almost normal at 0.68 5/18; the de-epithelialized area on the left medial lower leg is closed. The patient has severe chronic venous insufficiency with stasis dermatitis in this area. 5/25 everything is closed over here. The patient has chronic venous insufficiency stasis dermatitis and probably some degree of lymphedema. He has his Farrow wrap 4000 and we went  over putting these on today. He does not have an arterial issue 6/8; patient returns to clinic having developed a blister which ruptured on the right anterior lower leg. It was the left leg we were treating last time. He arrives in clinic today with clearly a wound that was initially a blister. Superficial. 2-3+ pitting edema in the right lower leg. They state that their primary doctor gave him 5 days of Lasix but he is finished this. We discharged him and Farrow wrap 4000's however they were not able to get the internal stocking layer on. Nevertheless the area that was problematic on the left side remains healed Electronic Signature(s) Signed: 11/16/2020 4:14:58 PM By: Linton Ham MD Entered By: Linton Ham on 11/16/2020 16:00:59 Joseph Graham ([id number]) -------------------------------------------------------------------------------- Physical Exam Details Patient Name: Joseph Graham. Date of Service: 11/16/2020 2:45 PM Medical Record Number: [id number] Patient Account Number: [id number] Date of Birth/Sex: 07-Jul-1940 (80 y.o. M) Treating RN: Cornell Barman Primary Care Provider: [name] Other Clinician: Referring Provider: [name] Treating Provider/Extender: Tito Dine in Treatment: 4 Constitutional Patient is hypertensive.. Pulse regular and within target range for patient.Marland Kitchen Respirations regular, non-labored and within target range.. Temperature is normal and within the target range for the patient.Marland Kitchen appears in no distress. Cardiovascular Pedal pulses are palpable. Notes Wound exam; the areas on the right anterior medial lower extremity. Fairly sizable but superficial wound. The blister is fully unroofed no evidence of surrounding infection however he has 2-3+ pitting edema without any suggestion of an acute DVT or cellulitis. Electronic Signature(s) Signed: 11/16/2020 4:14:58 PM By: Linton Ham MD Entered By: Linton Ham on 11/16/2020  16:02:25 Joseph Graham ([id number]) --------------------------------------------------------------------------------  Physician Orders Details Patient Name: Joseph, Graham. Date of Service: 11/16/2020 2:45 PM Medical Record Number: [id number] Patient Account Number: [id number] Date of Birth/Sex: [birth year] (80 y.o. M) Treating RN: Cornell Barman Primary Care Provider: [name] Other Clinician: Referring Provider: [name] Treating Provider/Extender: Tito Dine in Treatment: 4 Verbal / Phone Orders: No Diagnosis Coding Follow-up Appointments o Return Appointment in 1 week. Bathing/ Shower/ Hygiene o Wash wounds with antibacterial soap and water. Edema Control - Lymphedema / Segmental Compressive Device / Other o Optional: One layer of unna paste to top of compression wrap (to act as an anchor). Wound Treatment Wound #1 - Lower Leg Wound Laterality: Right, Anterior, Distal Primary Dressing: Silvercel 4 1/4x 4 1/4 (in/in) Discharge Instructions: Apply Silvercel 4 1/4x 4 1/4 (in/in) as instructed Secondary Dressing: ABD Pad 5x9 (in/in) Discharge Instructions: Cover with ABD pad Compression Wrap: Profore Lite LF 3 Multilayer Compression Bandaging System Discharge Instructions: Apply 3 multi-layer wrap as prescribed. Electronic Signature(s) Signed: 11/16/2020 4:14:58 PM By: Linton Ham MD Signed: 11/16/2020 4:36:28 PM By: Gretta Cool, BSN, RN, CWS, Kim RN, BSN Entered By: Gretta Cool, BSN, RN, CWS, Kim on 11/16/2020 15:19:36 Joseph Graham ([id number]) -------------------------------------------------------------------------------- Problem List Details Patient Name: Joseph Graham, Joseph Graham. Date of Service: 11/16/2020 2:45 PM Medical Record Number: [id number] Patient Account Number: [id number] Date of Birth/Sex: January 31, 1941 (80 y.o. M) Treating RN: Cornell Barman Primary Care Provider: [name] Other Clinician: Referring Provider: [name] Treating  Provider/Extender: Tito Dine in Treatment: 4 Active Problems ICD-10 Encounter Code Description Active Date MDM Diagnosis I87.332 Chronic venous hypertension (idiopathic) with ulcer and inflammation of 10/19/2020 No Yes left lower extremity L97.811 Non-pressure chronic ulcer of other part of right lower leg limited to 11/16/2020 No Yes breakdown of skin Inactive Problems ICD-10 Code Description Active Date Inactive Date L97.821 Non-pressure chronic ulcer of other part of left lower leg limited to breakdown 10/19/2020 10/19/2020 of skin Resolved Problems Electronic Signature(s) Signed: 11/16/2020 4:14:58 PM By: Linton Ham MD Entered By: Linton Ham on 11/16/2020 15:58:26 Joseph Graham ([id number]) -------------------------------------------------------------------------------- Progress Note Details Patient Name: Joseph Graham Date of Service: 11/16/2020 2:45 PM Medical Record Number: [id number] Patient Account Number: [id number] Date of Birth/Sex: 04-14-1941 (80 y.o. M) Treating RN: Cornell Barman Primary Care Provider: [name] Other Clinician: Referring Provider: [name] Treating Provider/Extender: Tito Dine in Treatment: 4 Subjective History of Present Illness (HPI) ADMISSION 10/19/2020 This is a patient referred [facility] for review of predominantly venous ulcers on his left leg. He is already had a noninvasive arterial review. He has had weeping and skin desquamation and blistering on the left leg for several months now. He was offered an Pension scheme manager at [facility] which she declined stating his neuropathy in his leg was too painful. He already has standard over the toe stockings that he does not wear. He has venous stasis in the left leg, bilateral pitting edema. He has had desquamation of the surface epithelium on a large area of the left anterior. Unfortunately without compression this is only going to  get worse. Past medical history includes COPD, right upper lobe lung CA, chronic oxygen, type 2 diabetes with peripheral neuropathy Arterial; [facility] done at [facility] on 10/04/2020; this showed an ABI in the right of 0.52 with biphasic and monophasic waveforms however his TBI was normal at 0.99. On the left he had an ABI of 0.87 with biphasic and monophasic waveforms but a great toe  pressure almost normal at 0.68 5/18; the de-epithelialized area on the left medial lower leg is closed. The patient has severe chronic venous insufficiency with stasis dermatitis in this area. 5/25 everything is closed over here. The patient has chronic venous insufficiency stasis dermatitis and probably some degree of lymphedema. He has his Farrow wrap 4000 and we went over putting these on today. He does not have an arterial issue 6/8; patient returns to clinic having developed a blister which ruptured on the right anterior lower leg. It was the left leg we were treating last time. He arrives in clinic today with clearly a wound that was initially a blister. Superficial. 2-3+ pitting edema in the right lower leg. They state that their primary doctor gave him 5 days of Lasix but he is finished this. We discharged him and Farrow wrap 4000's however they were not able to get the internal stocking layer on. Nevertheless the area that was problematic on the left side remains healed Objective Constitutional Patient is hypertensive.. Pulse regular and within target range for patient.Marland Kitchen Respirations regular, non-labored and within target range.. Temperature is normal and within the target range for the patient.Marland Kitchen appears in no distress. Vitals Time Taken: 2:55 PM, Height: 68 in, Weight: 178 lbs, BMI: 27.1, Temperature: 98.3 F, Pulse: 59 bpm, Respiratory Rate: 20 breaths/min, Blood Pressure: 165/65 mmHg. Cardiovascular Pedal pulses are palpable. General Notes: Wound exam; the areas on the right anterior medial  lower extremity. Fairly sizable but superficial wound. The blister is fully unroofed no evidence of surrounding infection however he has 2-3+ pitting edema without any suggestion of an acute DVT or cellulitis. Integumentary (Hair, Skin) Wound #1 status is Open. Original cause of wound was Gradually Appeared. The date acquired was: 11/02/2020. The wound is located on the Right,Distal,Anterior Lower Leg. The wound measures 5.2cm length x 4cm width x 0.1cm depth; 16.336cm^2 area and 1.634cm^3 volume. There is Fat Layer (Subcutaneous Tissue) exposed. There is no tunneling or undermining noted. There is a medium amount of serous drainage noted. There is large (67-100%) pink granulation within the wound bed. There is a small (1-33%) amount of necrotic tissue within the wound bed including Adherent Slough. Assessment Joseph Graham, Joseph Graham ([id number]) Active Problems ICD-10 Chronic venous hypertension (idiopathic) with ulcer and inflammation of left lower extremity Non-pressure chronic ulcer of other part of right lower leg limited to breakdown of skin Procedures Wound #1 Pre-procedure diagnosis of Wound #1 is a Lymphedema located on the Right,Distal,Anterior Lower Leg . There was a Three Layer Compression Therapy Procedure by Cornell Barman, RN. Post procedure Diagnosis Wound #1: Same as Pre-Procedure Notes: TBI 0.99. Plan Follow-up Appointments: Return Appointment in 1 week. Bathing/ Shower/ Hygiene: Wash wounds with antibacterial soap and water. Edema Control - Lymphedema / Segmental Compressive Device / Other: Optional: One layer of unna paste to top of compression wrap (to act as an anchor). WOUND #1: - Lower Leg Wound Laterality: Right, Anterior, Distal Primary Dressing: Silvercel 4 1/4x 4 1/4 (in/in) Discharge Instructions: Apply Silvercel 4 1/4x 4 1/4 (in/in) as instructed Secondary Dressing: ABD Pad 5x9 (in/in) Discharge Instructions: Cover with ABD pad Compression Wrap: Profore Lite LF 3  Multilayer Compression Bandaging System Discharge Instructions: Apply 3 multi-layer wrap as prescribed. 1. We use silver alginate ABDs 3 layer compression. I think there would be room to move to 4-layer compression if necessary but I doubt that will be required 2. He has a lot of edema in the right leg I think a lot of this  is chronic venous insufficiency however he might have for reasons to be in right heart failure severe oxygen dependent COPD, lung cancer etc. I checked him briefly for this I did not see any evidence his JVP not elevated he had no sacral edema 3. They have not been able to put the inner layer of the wraps on on either side nevertheless we might be able to increase the tension Electronic Signature(s) Signed: 11/16/2020 4:14:58 PM By: Linton Ham MD Entered By: Linton Ham on 11/16/2020 16:04:36 Joseph Graham ([id number]) -------------------------------------------------------------------------------- SuperBill Details Patient Name: Joseph Graham. Date of Service: 11/16/2020 Medical Record Number: [id number] Patient Account Number: [id number] Date of Birth/Sex: 1940/12/24 (80 y.o. M) Treating RN: Cornell Barman Primary Care Provider: [name] Other Clinician: Referring Provider: [name] Treating Provider/Extender: Tito Dine in Treatment: 4 Diagnosis Coding ICD-10 Codes Code Description [phone] Chronic venous hypertension (idiopathic) with ulcer and inflammation of left lower extremity L97.811 Non-pressure chronic ulcer of other part of right lower leg limited to breakdown of skin Facility Procedures CPT4 Code: [id number] Description: (Facility Use Only) [phone] - [facility] RT LEG Modifier: Quantity: 1 Physician Procedures CPT4 Code Description: [id number] [place] - WC PHYS LEVEL 4 - EST PT Modifier: Quantity: 1 CPT4 Code Description: ICD-10 Diagnosis Description I87.332 Chronic venous hypertension (idiopathic) with  ulcer and inflammation of lef L97.811 Non-pressure chronic ulcer of other part of right lower leg limited to brea Modifier: t lower extremity kdown of skin Quantity: Electronic Signature(s) Signed: 11/16/2020 4:14:58 PM By: Linton Ham MD Entered By: Linton Ham on 11/16/2020 16:05:18

## 2020-11-21 ENCOUNTER — Other Ambulatory Visit: Payer: Self-pay | Admitting: *Deleted

## 2020-11-23 ENCOUNTER — Encounter: Payer: Self-pay | Admitting: Family Medicine

## 2020-11-23 ENCOUNTER — Other Ambulatory Visit: Payer: Self-pay

## 2020-11-23 ENCOUNTER — Encounter: Payer: Self-pay | Admitting: *Deleted

## 2020-11-23 ENCOUNTER — Encounter: Payer: Medicare Other | Admitting: Internal Medicine

## 2020-11-23 DIAGNOSIS — Z9981 Dependence on supplemental oxygen: Secondary | ICD-10-CM | POA: Diagnosis not present

## 2020-11-23 DIAGNOSIS — J449 Chronic obstructive pulmonary disease, unspecified: Secondary | ICD-10-CM | POA: Diagnosis not present

## 2020-11-23 DIAGNOSIS — I872 Venous insufficiency (chronic) (peripheral): Secondary | ICD-10-CM | POA: Diagnosis not present

## 2020-11-23 DIAGNOSIS — L97821 Non-pressure chronic ulcer of other part of left lower leg limited to breakdown of skin: Secondary | ICD-10-CM | POA: Diagnosis not present

## 2020-11-23 DIAGNOSIS — L97812 Non-pressure chronic ulcer of other part of right lower leg with fat layer exposed: Secondary | ICD-10-CM | POA: Diagnosis not present

## 2020-11-23 DIAGNOSIS — Z85118 Personal history of other malignant neoplasm of bronchus and lung: Secondary | ICD-10-CM | POA: Diagnosis not present

## 2020-11-23 DIAGNOSIS — I890 Lymphedema, not elsewhere classified: Secondary | ICD-10-CM | POA: Diagnosis not present

## 2020-11-23 DIAGNOSIS — E1142 Type 2 diabetes mellitus with diabetic polyneuropathy: Secondary | ICD-10-CM | POA: Diagnosis not present

## 2020-11-23 DIAGNOSIS — I10 Essential (primary) hypertension: Secondary | ICD-10-CM | POA: Diagnosis not present

## 2020-11-23 DIAGNOSIS — E11622 Type 2 diabetes mellitus with other skin ulcer: Secondary | ICD-10-CM | POA: Diagnosis not present

## 2020-11-24 NOTE — Progress Notes (Signed)
SURESH, AUDI ([id number]) Visit Report for 11/23/2020 HPI Details Patient Name: Joseph Graham, Joseph Graham. Date of Service: 11/23/2020 10:30 AM Medical Record Number: [id number] Patient Account Number: [id number] Date of Birth/Sex: [birth year] (80 y.o. M) Treating RN: Cornell Barman Primary Care Provider: [name] Other Clinician: Referring Provider: [name] Treating Provider/Extender: Tito Dine in Treatment: 5 History of Present Illness HPI Description: ADMISSION 10/19/2020 This is a patient [facility] for review of predominantly venous ulcers on his left leg. He is already had a noninvasive arterial review. He has had weeping and skin desquamation and blistering on the left leg for several months now. He was offered an Pension scheme manager at [facility] which she declined stating his neuropathy in his leg was too painful. He already has standard over the toe stockings that he does not wear. He has venous stasis in the left leg, bilateral pitting edema. He has had desquamation of the surface epithelium on a large area of the left anterior. Unfortunately without compression this is only going to get worse. Past medical history includes COPD, right upper lobe lung CA, chronic oxygen, type 2 diabetes with peripheral neuropathy Arterial; [facility] on 10/04/2020; this showed an ABI in the right of 0.52 with biphasic and monophasic waveforms however his TBI was normal at 0.99. On the left he had an ABI of 0.87 with biphasic and monophasic waveforms but a great toe pressure almost normal at 0.68 5/18; the de-epithelialized area on the left medial lower leg is closed. The patient has severe chronic venous insufficiency with stasis dermatitis in this area. 5/25 everything is closed over here. The patient has chronic venous insufficiency stasis dermatitis and probably some degree of lymphedema. He has his Farrow wrap 4000 and we went  over putting these on today. He does not have an arterial issue 6/8; patient returns to clinic having developed a blister which ruptured on the right anterior lower leg. It was the left leg we were treating last time. He arrives in clinic today with clearly a wound that was initially a blister. Superficial. 2-3+ pitting edema in the right lower leg. They state that their primary doctor gave him 5 days of Lasix but he is finished this. We discharged him and Farrow wrap 4000's however they were not able to get the internal stocking layer on. Nevertheless the area that was problematic on the left side remains healed 6/15; everything is closed the right anterior lower leg wound is closed. He has the Farrow wrap on the left leg and this is closed. They have not been able to get the inner sleeve/stocking of the Farrow wrap on. We gave him some Tubigrip today. Electronic Signature(s) Signed: 11/23/2020 4:10:05 PM By: Linton Ham MD Entered By: Linton Ham on 11/23/2020 12:47:01 Joseph Graham ([id number]) -------------------------------------------------------------------------------- Physical Exam Details Patient Name: Joseph Graham Date of Service: 11/23/2020 10:30 AM Medical Record Number: [id number] Patient Account Number: [id number] Date of Birth/Sex: August 21, 1940 (80 y.o. M) Treating RN: Cornell Barman Primary Care Provider: [name] Other Clinician: Referring Provider: [name] Treating Provider/Extender: Tito Dine in Treatment: 5 Constitutional Patient is hypertensive.. Pulse regular and within target range for patient.Marland Kitchen Respirations regular, non-labored and within target range.. Temperature is normal and within the target range for the patient.Marland Kitchen appears in no distress. Notes Wound exam; the area on the right anterior medial lower extremity is fully epithelialized. This was a blister. He has no open  area on the left he has a stocking on. Electronic  Signature(s) Signed: 11/23/2020 4:10:05 PM By: Linton Ham MD Entered By: Linton Ham on 11/23/2020 12:47:48 Joseph Graham ([id number]) -------------------------------------------------------------------------------- Physician Orders Details Patient Name: Joseph Graham Date of Service: 11/23/2020 10:30 AM Medical Record Number: [id number] Patient Account Number: [id number] Date of Birth/Sex: September 25, 1940 (80 y.o. M) Treating RN: Cornell Barman Primary Care Provider: [name] Other Clinician: Referring Provider: [name] Treating Provider/Extender: Tito Dine in Treatment: 5 Verbal / Phone Orders: No Diagnosis Coding Discharge From [facility] o Discharge from [facility] o Wear compression garments daily. Put garments on first thing when you wake up and remove them before bed. Electronic Signature(s) Signed: 11/23/2020 4:10:05 PM By: Linton Ham MD Signed: 11/23/2020 5:10:04 PM By: Gretta Cool, BSN, RN, CWS, Kim RN, BSN Entered By: Gretta Cool, BSN, RN, CWS, Kim on 11/23/2020 11:16:37 Joseph Graham ([id number]) -------------------------------------------------------------------------------- Problem List Details Patient Name: Joseph Graham, Joseph Graham. Date of Service: 11/23/2020 10:30 AM Medical Record Number: [id number] Patient Account Number: [id number] Date of Birth/Sex: 1941-05-04 (80 y.o. M) Treating RN: Cornell Barman Primary Care Provider: [name] Other Clinician: Referring Provider: [name] Treating Provider/Extender: Tito Dine in Treatment: 5 Active Problems ICD-10 Encounter Code Description Active Date MDM Diagnosis I87.332 Chronic venous hypertension (idiopathic) with ulcer and inflammation of 10/19/2020 No Yes left lower extremity L97.811 Non-pressure chronic ulcer of other part of right lower leg limited to 11/16/2020 No Yes breakdown of skin Inactive Problems ICD-10 Code Description  Active Date Inactive Date L97.821 Non-pressure chronic ulcer of other part of left lower leg limited to breakdown 10/19/2020 10/19/2020 of skin Resolved Problems Electronic Signature(s) Signed: 11/23/2020 4:10:05 PM By: Linton Ham MD Entered By: Linton Ham on 11/23/2020 12:45:35 Joseph Graham ([id number]) -------------------------------------------------------------------------------- Progress Note Details Patient Name: Joseph Graham. Date of Service: 11/23/2020 10:30 AM Medical Record Number: [id number] Patient Account Number: [id number] Date of Birth/Sex: 1941/06/01 (80 y.o. M) Treating RN: Cornell Barman Primary Care Provider: [name] Other Clinician: Referring Provider: [name] Treating Provider/Extender: Tito Dine in Treatment: 5 Subjective History of Present Illness (HPI) ADMISSION 10/19/2020 This is a patient referred from [facility] for review of predominantly venous ulcers on his left leg. He is already had a noninvasive arterial review. He has had weeping and skin desquamation and blistering on the left leg for several months now. He was offered an Pension scheme manager at [facility] which she declined stating his neuropathy in his leg was too painful. He already has standard over the toe stockings that he does not wear. He has venous stasis in the left leg, bilateral pitting edema. He has had desquamation of the surface epithelium on a large area of the left anterior. Unfortunately without compression this is only going to get worse. Past medical history includes COPD, right upper lobe lung CA, chronic oxygen, type 2 diabetes with peripheral neuropathy Arterial; [facility] on 10/04/2020; this showed an ABI in the right of 0.52 with biphasic and monophasic waveforms however his TBI was normal at 0.99. On the left he had an ABI of 0.87 with biphasic and monophasic waveforms but a great toe pressure  almost normal at 0.68 5/18; the de-epithelialized area on the left medial lower leg is closed. The patient has severe chronic venous insufficiency with stasis dermatitis in this area. 5/25 everything is closed over here. The patient has chronic venous insufficiency stasis dermatitis  and probably some degree of lymphedema. He has his Farrow wrap 4000 and we went over putting these on today. He does not have an arterial issue 6/8; patient returns to clinic having developed a blister which ruptured on the right anterior lower leg. It was the left leg we were treating last time. He arrives in clinic today with clearly a wound that was initially a blister. Superficial. 2-3+ pitting edema in the right lower leg. They state that their primary doctor gave him 5 days of Lasix but he is finished this. We discharged him and Farrow wrap 4000's however they were not able to get the internal stocking layer on. Nevertheless the area that was problematic on the left side remains healed 6/15; everything is closed the right anterior lower leg wound is closed. He has the Farrow wrap on the left leg and this is closed. They have not been able to get the inner sleeve/stocking of the Farrow wrap on. We gave him some Tubigrip today. Objective Constitutional Patient is hypertensive.. Pulse regular and within target range for patient.Marland Kitchen Respirations regular, non-labored and within target range.. Temperature is normal and within the target range for the patient.Marland Kitchen appears in no distress. Vitals Time Taken: 10:35 AM, Height: 68 in, Weight: 178 lbs, BMI: 27.1, Temperature: 98.4 F, Pulse: 66 bpm, Respiratory Rate: 20 breaths/min, Blood Pressure: 152/62 mmHg. General Notes: Wound exam; the area on the right anterior medial lower extremity is fully epithelialized. This was a blister. He has no open area on the left he has a stocking on. Integumentary (Hair, Skin) Wound #1 status is Healed - Epithelialized. Original cause of  wound was Gradually Appeared. The date acquired was: 11/02/2020. The wound has been in treatment 1 weeks. The wound is located on the Right,Distal,Anterior Lower Leg. The wound measures 0cm length x 0cm width x 0cm depth; 0cm^2 area and 0cm^3 volume. There is Fat Layer (Subcutaneous Tissue) exposed. There is no tunneling or undermining noted. There is a medium amount of serous drainage noted. There is large (67-100%) pink granulation within the wound bed. There is a small (1-33%) amount of necrotic tissue within the wound bed. Assessment Joseph Graham, Joseph Graham ([id number]) Active Problems ICD-10 Chronic venous hypertension (idiopathic) with ulcer and inflammation of left lower extremity Non-pressure chronic ulcer of other part of right lower leg limited to breakdown of skin Plan Discharge From [facility]: Discharge from [facility] Wear compression garments daily. Put garments on first thing when you wake up and remove them before bed. 1. The patient can be discharged from the wound care center 2. Discharged to his own Farrow wrap compression garments of his wife is helping him with. 3. Reminded to moisturize his legs and attempt to keep them elevated when sitting for long periods 4. We gave him some Tubigrip to wear on the inner surface. They can order this off [employer]) Signed: 11/23/2020 4:10:05 PM By: Linton Ham MD Entered By: Linton Ham on 11/23/2020 12:48:28 Joseph Graham ([id number]) -------------------------------------------------------------------------------- [place] Details Patient Name: Joseph Graham Date of Service: 11/23/2020 Medical Record Number: [id number] Patient Account Number: [id number] Date of Birth/Sex: [birth year] (80 y.o. M) Treating RN: Cornell Barman Primary Care Provider: [name] Other Clinician: Referring Provider: [name] Treating Provider/Extender: Tito Dine in  Treatment: 5 Diagnosis Coding ICD-10 Codes Code Description [id number] Chronic venous hypertension (idiopathic) with ulcer and inflammation of left lower extremity L97.811 Non-pressure chronic ulcer of other part of right lower leg limited to  breakdown of skin Facility Procedures CPT4 Code: [id number] Description: [phone] - WOUND CARE VISIT-LEV 2 EST PT Modifier: Quantity: 1 Physician Procedures CPT4 Code Description: [id number] - WC PHYS LEVEL 2 - EST PT Modifier: Quantity: 1 CPT4 Code Description: ICD-10 Diagnosis Description I87.332 Chronic venous hypertension (idiopathic) with ulcer and inflammation of lef L97.811 Non-pressure chronic ulcer of other part of right lower leg limited to brea Modifier: t lower extremity kdown of skin Quantity: Electronic Signature(s) Signed: 11/23/2020 4:10:05 PM By: Linton Ham MD Entered By: Linton Ham on 11/23/2020 12:49:39

## 2020-11-24 NOTE — Telephone Encounter (Signed)
Pt called the office to set up an appt. I scheduled him for a VV 11-29-20.

## 2020-11-25 ENCOUNTER — Encounter: Payer: Self-pay | Admitting: Internal Medicine

## 2020-11-25 MED ORDER — HYDROCODONE-ACETAMINOPHEN 5-325 MG PO TABS: 1.0000 | ORAL_TABLET | Freq: Four times a day (QID) | ORAL | 0 refills | Status: DC | PRN

## 2020-11-28 ENCOUNTER — Ambulatory Visit: Payer: Medicare Other | Admitting: Radiation Oncology

## 2020-11-28 NOTE — Progress Notes (Signed)
Joseph Graham, Joseph Graham ([id number]) Visit Report for 11/23/2020 Arrival Information Details Patient Name: Joseph Graham, PROSCH. Date of Service: 11/23/2020 10:30 AM Medical Record Number: [id number] Patient Account Number: [id number] Date of Birth/Sex: 1941/03/15 (80 y.o. M) Treating RN: Donnamarie Poag Primary Care Xoey Warmoth: [name] Other Clinician: Referring Irja Wheless: [name] Treating Timmy Bubeck/Extender: Tito Dine in Treatment: 5 Visit Information History Since Last Visit Added or deleted any medications: No Patient Arrived: Wheel Chair Had a fall or experienced change in No Arrival Time: 10:30 activities of daily living that may affect Accompanied By: wife risk of falls: Transfer Assistance: EasyPivot Patient Lift Hospitalized since last visit: No Patient Identification Verified: Yes Has Dressing in Place as Prescribed: Yes Patient Requires Transmission-Based No Pain Present Now: Yes Precautions: Patient Has Alerts: Yes Patient Alerts: Type II Diabetic TBI 09/30/20 L 0.68 R 0.99 Electronic Signature(s) Signed: 11/28/2020 11:12:12 AM By: Donnamarie Poag Entered By: Donnamarie Poag on 11/23/2020 10:38:17 Joseph Graham ([id number]) -------------------------------------------------------------------------------- Clinic Level of Care Assessment Details Patient Name: Joseph Graham. Date of Service: 11/23/2020 10:30 AM Medical Record Number: [id number] Patient Account Number: [id number] Date of Birth/Sex: 1940/07/15 (80 y.o. M) Treating RN: Cornell Barman Primary Care Shamaya Kauer: [name] Other Clinician: Referring Poet Hineman: [name] Treating Sonia Stickels/Extender: Tito Dine in Treatment: 5 Clinic Level of Care Assessment Items TOOL 4 Quantity Score []  - Use when only an EandM is performed on FOLLOW-UP visit 0 ASSESSMENTS - Nursing Assessment / Reassessment X - Reassessment of Co-morbidities (includes updates in patient status) 1 10 X- 1  5 Reassessment of Adherence to Treatment Plan ASSESSMENTS - Wound and Skin Assessment / Reassessment X - Simple Wound Assessment / Reassessment - one wound 1 5 []  - 0 Complex Wound Assessment / Reassessment - multiple wounds []  - 0 Dermatologic / Skin Assessment (not related to wound area) ASSESSMENTS - Focused Assessment []  - Circumferential Edema Measurements - multi extremities 0 []  - 0 Nutritional Assessment / Counseling / Intervention []  - 0 Lower Extremity Assessment (monofilament, tuning fork, pulses) []  - 0 Peripheral Arterial Disease Assessment (using hand held doppler) ASSESSMENTS - Ostomy and/or Continence Assessment and Care []  - Incontinence Assessment and Management 0 []  - 0 Ostomy Care Assessment and Management (repouching, etc.) PROCESS - Coordination of Care X - Simple Patient / Family Education for ongoing care 1 15 []  - 0 Complex (extensive) Patient / Family Education for ongoing care []  - 0 Staff obtains Programmer, systems, Records, Test Results / Process Orders []  - 0 Staff telephones HHA, Nursing Homes / Clarify orders / etc []  - 0 Routine Transfer to another Facility (non-emergent condition) []  - 0 Routine Hospital Admission (non-emergent condition) []  - 0 New Admissions / Biomedical engineer / Ordering NPWT, Apligraf, etc. []  - 0 Emergency Hospital Admission (emergent condition) X- 1 10 Simple Discharge Coordination []  - 0 Complex (extensive) Discharge Coordination PROCESS - Special Needs []  - Pediatric / Minor Patient Management 0 []  - 0 Isolation Patient Management []  - 0 Hearing / Language / Visual special needs []  - 0 Assessment of Community assistance (transportation, D/C planning, etc.) []  - 0 Additional assistance / Altered mentation []  - 0 Support Surface(s) Assessment (bed, cushion, seat, etc.) INTERVENTIONS - Wound Cleansing / Measurement Joseph Graham, Joseph L. ([id number]) X- 1 5 Simple Wound Cleansing - one wound []  - 0 Complex Wound  Cleansing - multiple wounds []  - 0 Wound Imaging (photographs - any number of wounds) []  - 0 Wound Tracing (instead of photographs) X- 1 5 Simple  Wound Measurement - one wound []  - 0 Complex Wound Measurement - multiple wounds INTERVENTIONS - Wound Dressings []  - Small Wound Dressing one or multiple wounds 0 []  - 0 Medium Wound Dressing one or multiple wounds []  - 0 Large Wound Dressing one or multiple wounds []  - 0 Application of Medications - topical []  - 0 Application of Medications - injection INTERVENTIONS - Miscellaneous []  - External ear exam 0 []  - 0 Specimen Collection (cultures, biopsies, blood, body fluids, etc.) []  - 0 Specimen(s) / Culture(s) sent or taken to Lab for analysis []  - 0 Patient Transfer (multiple staff / Civil Service fast streamer / Similar devices) []  - 0 Simple Staple / Suture removal (25 or less) []  - 0 Complex Staple / Suture removal (26 or more) []  - 0 Hypo / Hyperglycemic Management (close monitor of Blood Glucose) []  - 0 Ankle / Brachial Index (ABI) - do not check if billed separately X- 1 5 Vital Signs Has the patient been seen at the hospital within the last three years: Yes Total Score: 60 Level Of Care: New/Established - Level 2 Electronic Signature(s) Signed: 11/23/2020 5:10:04 PM By: Gretta Cool, BSN, RN, CWS, Kim RN, BSN Entered By: Gretta Cool, BSN, RN, CWS, Kim on 11/23/2020 11:17:06 Joseph Graham ([id number]) -------------------------------------------------------------------------------- Encounter Discharge Information Details Patient Name: Joseph Graham. Date of Service: 11/23/2020 10:30 AM Medical Record Number: [id number] Patient Account Number: [id number] Date of Birth/Sex: 28-Apr-1941 (80 y.o. M) Treating RN: Cornell Barman Primary Care Emilyrose Darrah: [name] Other Clinician: Referring Jazmen Lindenbaum: [name] Treating Kori Colin/Extender: Tito Dine in Treatment: 5 Encounter Discharge Information Items Discharge Condition:  Stable Ambulatory Status: Wheelchair Discharge Destination: Home Transportation: Private Auto Accompanied By: wife Schedule Follow-up Appointment: No Clinical Summary of Care: Electronic Signature(s) Signed: 11/23/2020 5:10:04 PM By: Gretta Cool, BSN, RN, CWS, Kim RN, BSN Entered By: Gretta Cool, BSN, RN, CWS, Kim on 11/23/2020 11:22:50 Joseph Graham ([id number]) -------------------------------------------------------------------------------- Lower Extremity Assessment Details Patient Name: Joseph Graham, Joseph Graham. Date of Service: 11/23/2020 10:30 AM Medical Record Number: [id number] Patient Account Number: [id number] Date of Birth/Sex: 1941/01/20 (80 y.o. M) Treating RN: Donnamarie Poag Primary Care Layal Javid: [name] Other Clinician: Referring Su Duma: [name] Treating Nakaila Freeze/Extender: Tito Dine in Treatment: 5 Edema Assessment Assessed: [Left: No] [Right: Yes] [Left: Edema] [Right: :] Calf Left: Right: Point of Measurement: 29 cm From Medial Instep 33 cm Ankle Left: Right: Point of Measurement: 11 cm From Medial Instep 22.8 cm Vascular Assessment Pulses: Dorsalis Pedis Palpable: [Right:Yes] Electronic Signature(s) Signed: 11/28/2020 11:12:12 AM By: Donnamarie Poag Entered By: Donnamarie Poag on 11/23/2020 10:41:08 Joseph Graham ([id number]) -------------------------------------------------------------------------------- Multi Wound Chart Details Patient Name: Joseph Graham. Date of Service: 11/23/2020 10:30 AM Medical Record Number: [id number] Patient Account Number: [id number] Date of Birth/Sex: 05/31/1941 (80 y.o. M) Treating RN: Cornell Barman Primary Care Genia Perin: [name] Other Clinician: Referring Keiston Manley: [name] Treating Nieves Chapa/Extender: Tito Dine in Treatment: 5 Vital Signs Height(in): 75 Pulse(bpm): 35 Weight(lbs): 178 Blood Pressure(mmHg): 152/62 Body Mass Index(BMI): 27 Temperature(F): 98.4 Respiratory  Rate(breaths/min): 20 Photos: [N/A:N/A] Wound Location: Right, Distal, Anterior Lower Leg N/A N/A Wounding Event: Gradually Appeared N/A N/A Primary Etiology: Lymphedema N/A N/A Comorbid History: Cataracts, Lymphedema, Chronic N/A N/A Obstructive Pulmonary Disease (COPD), Coronary Artery Disease, Hypertension, Peripheral Arterial Disease, Type II Diabetes, Osteoarthritis, Neuropathy, Received Chemotherapy, Received Radiation Date Acquired: 11/02/2020 N/A N/A Weeks of Treatment: 1 N/A N/A Wound Status: Healed - Epithelialized N/A N/A Measurements L x W x D (cm) 0x0x0 N/A N/A Area (  cm) : 0 N/A N/A Volume (cm) : 0 N/A N/A % Reduction in Area: 100.00% N/A N/A % Reduction in Volume: 100.00% N/A N/A Classification: Full Thickness Without Exposed N/A N/A Support Structures Exudate Amount: Medium N/A N/A Exudate Type: Serous N/A N/A Exudate Color: amber N/A N/A Granulation Amount: Large (67-100%) N/A N/A Granulation Quality: Pink N/A N/A Necrotic Amount: Small (1-33%) N/A N/A Exposed Structures: Fat Layer (Subcutaneous Tissue): N/A N/A Yes Fascia: No Tendon: No Muscle: No Joint: No Bone: No Epithelialization: Small (1-33%) N/A N/A Treatment Notes Electronic Signature(s) Signed: 11/23/2020 4:10:05 PM By: Linton Ham MD Joseph Graham ([id number]) Entered By: Linton Ham on 11/23/2020 12:46:14 Joseph Graham ([id number]) -------------------------------------------------------------------------------- [place] Details Patient Name: Joseph Graham. Date of Service: 11/23/2020 10:30 AM Medical Record Number: [id number] Patient Account Number: [id number] Date of Birth/Sex: 31-Aug-1940 (80 y.o. M) Treating RN: Cornell Barman Primary Care Limmie Schoenberg: [name] Other Clinician: Referring Lucile Didonato: [name] Treating Kynlei Piontek/Extender: Tito Dine in Treatment: 5 Active Inactive Electronic Signature(s) Signed: 11/23/2020  5:10:04 PM By: Gretta Cool, BSN, RN, CWS, Kim RN, BSN Entered By: Gretta Cool, BSN, RN, CWS, Kim on 11/23/2020 11:15:54 Joseph Graham ([id number]) -------------------------------------------------------------------------------- Pain Assessment Details Patient Name: Joseph Graham Date of Service: 11/23/2020 10:30 AM Medical Record Number: [id number] Patient Account Number: [id number] Date of Birth/Sex: 25-May-1941 (80 y.o. M) Treating RN: Donnamarie Poag Primary Care Joni Norrod: [name] Other Clinician: Referring Roslyn Else: [name] Treating Julyana Woolverton/Extender: Tito Dine in Treatment: 5 Active Problems Location of Pain Severity and Description of Pain Patient Has Paino Yes Site Locations Pain Location: Pain in Ulcers Rate the pain. Current Pain Level: 6 Pain Management and Medication Current Pain Management: Electronic Signature(s) Signed: 11/28/2020 11:12:12 AM By: Donnamarie Poag Entered By: Donnamarie Poag on 11/23/2020 10:38:09 Joseph Graham ([id number]) -------------------------------------------------------------------------------- Patient/Caregiver Education Details Patient Name: Joseph Graham Date of Service: 11/23/2020 10:30 AM Medical Record Number: [id number] Patient Account Number: [id number] Date of Birth/Gender: 1940/07/08 (80 y.o. M) Treating RN: Cornell Barman Primary Care Physician: [name] Other Clinician: Referring Physician: [name] Treating Physician/Extender: Tito Dine in Treatment: 5 Education Assessment Education Provided To: Patient Education Topics Provided Wound/Skin Impairment: Handouts: Caring for Your Ulcer Methods: Demonstration, Explain/Verbal Responses: State content correctly Electronic Signature(s) Signed: 11/23/2020 5:10:04 PM By: Gretta Cool, BSN, RN, CWS, Kim RN, BSN Entered By: Gretta Cool, BSN, RN, CWS, Kim on 11/23/2020 11:17:23 Joseph Graham  ([id number]) -------------------------------------------------------------------------------- Wound Assessment Details Patient Name: Joseph Graham Date of Service: 11/23/2020 10:30 AM Medical Record Number: [id number] Patient Account Number: [id number] Date of Birth/Sex: [birth year] (80 y.o. M) Treating RN: Cornell Barman Primary Care Tniya Bowditch: [name] Other Clinician: Referring Elesa Garman: [name] Treating Gudrun Axe/Extender: Tito Dine in Treatment: 5 Wound Status Wound Number: 1 Primary Lymphedema Etiology: Wound Location: Right, Distal, Anterior Lower Leg Wound Healed - Epithelialized Wounding Event: Gradually Appeared Status: Date Acquired: 11/02/2020 Comorbid Cataracts, Lymphedema, Chronic Obstructive Pulmonary Weeks Of Treatment: 1 History: Disease (COPD), Coronary Artery Disease, Hypertension, Clustered Wound: No Peripheral Arterial Disease, Type II Diabetes, Osteoarthritis, Neuropathy, Received Chemotherapy, Received Radiation Photos Wound Measurements Length: (cm) 0 Width: (cm) 0 Depth: (cm) 0 Area: (cm) Volume: (cm) % Reduction in Area: 100% % Reduction in Volume: 100% Epithelialization: Small (1-33%) 0 Tunneling: No 0 Undermining: No Wound Description Classification: Full Thickness Without Exposed Support Structures Exudate Amount: Medium Exudate Type: Serous Exudate Color: amber Foul Odor After Cleansing: No Slough/Fibrino Yes Wound Bed Granulation Amount: Large (67-100%) Exposed Structure Granulation  Quality: Pink Fascia Exposed: No Necrotic Amount: Small (1-33%) Fat Layer (Subcutaneous Tissue) Exposed: Yes Tendon Exposed: No Muscle Exposed: No Joint Exposed: No Bone Exposed: No Electronic Signature(s) Signed: 11/23/2020 5:10:04 PM By: Gretta Cool, BSN, RN, CWS, Kim RN, BSN Entered By: Gretta Cool, BSN, RN, CWS, Kim on 11/23/2020 11:15:30 Joseph Graham  ([id number]) -------------------------------------------------------------------------------- [place] Details Patient Name: Joseph Graham. Date of Service: 11/23/2020 10:30 AM Medical Record Number: [id number] Patient Account Number: [id number] Date of Birth/Sex: 1940-10-19 (80 y.o. M) Treating RN: Donnamarie Poag Primary Care Alayssa Flinchum: [name] Other Clinician: Referring Lashawna Poche: [name] Treating Kaetlyn Noa/Extender: Tito Dine in Treatment: 5 Vital Signs Time Taken: 10:35 Temperature (F): 98.4 Height (in): 68 Pulse (bpm): 66 Weight (lbs): 178 Respiratory Rate (breaths/min): 20 Body Mass Index (BMI): 27.1 Blood Pressure (mmHg): 152/62 Reference Range: 80 - 120 mg / dl Electronic Signature(s) Signed: 11/28/2020 11:12:12 AM By: Donnamarie Poag Entered ByDonnamarie Poag on 11/23/2020 10:38:01

## 2020-11-29 ENCOUNTER — Telehealth: Payer: Self-pay | Admitting: Hospice and Palliative Medicine

## 2020-11-29 ENCOUNTER — Encounter: Payer: Self-pay | Admitting: Family Medicine

## 2020-11-29 ENCOUNTER — Telehealth (INDEPENDENT_AMBULATORY_CARE_PROVIDER_SITE_OTHER): Payer: Medicare Other | Admitting: Family Medicine

## 2020-11-29 VITALS — BP 142/62 | HR 57 | Ht 68.0 in | Wt 172.0 lb

## 2020-11-29 DIAGNOSIS — J9611 Chronic respiratory failure with hypoxia: Secondary | ICD-10-CM | POA: Diagnosis not present

## 2020-11-29 DIAGNOSIS — J449 Chronic obstructive pulmonary disease, unspecified: Secondary | ICD-10-CM | POA: Diagnosis not present

## 2020-11-29 DIAGNOSIS — Z79899 Other long term (current) drug therapy: Secondary | ICD-10-CM | POA: Diagnosis not present

## 2020-11-29 DIAGNOSIS — M5136 Other intervertebral disc degeneration, lumbar region: Secondary | ICD-10-CM

## 2020-11-29 DIAGNOSIS — G894 Chronic pain syndrome: Secondary | ICD-10-CM

## 2020-11-29 DIAGNOSIS — T451X5A Adverse effect of antineoplastic and immunosuppressive drugs, initial encounter: Secondary | ICD-10-CM | POA: Diagnosis not present

## 2020-11-29 DIAGNOSIS — E663 Overweight: Secondary | ICD-10-CM

## 2020-11-29 DIAGNOSIS — I872 Venous insufficiency (chronic) (peripheral): Secondary | ICD-10-CM

## 2020-11-29 DIAGNOSIS — L97821 Non-pressure chronic ulcer of other part of left lower leg limited to breakdown of skin: Secondary | ICD-10-CM

## 2020-11-29 DIAGNOSIS — G620 Drug-induced polyneuropathy: Secondary | ICD-10-CM

## 2020-11-29 DIAGNOSIS — G250 Essential tremor: Secondary | ICD-10-CM | POA: Diagnosis not present

## 2020-11-29 DIAGNOSIS — G608 Other hereditary and idiopathic neuropathies: Secondary | ICD-10-CM | POA: Diagnosis not present

## 2020-11-29 DIAGNOSIS — H3530 Unspecified macular degeneration: Secondary | ICD-10-CM

## 2020-11-29 MED ORDER — HYDROCODONE-ACETAMINOPHEN 5-325 MG PO TABS: 1.0000 | ORAL_TABLET | Freq: Four times a day (QID) | ORAL | 0 refills | Status: DC | PRN

## 2020-11-29 NOTE — Telephone Encounter (Signed)
I received a call from patient.  He reports that he has weaned himself from the gabapentin and the primidone as a result he is markedly less tired during the day.  Overall, he feels improved but did ask about the possibility of taking more Norco if his pain worsens off the gabapentin.  Okay if he takes 1 to 2 tablets of Norco Q6H as needed for pain. This was refilled last week for #90.

## 2020-11-29 NOTE — Progress Notes (Signed)
Patient ID: Joseph Graham, male    DOB: 02-Aug-1940, 80 y.o.   MRN: [id number]  Virtual visit attempted through [place], a video enabled telemedicine application. Due to national recommendations of social distancing due to COVID-19, a virtual visit is felt to be most appropriate for this patient at this time. Reviewed limitations, risks, security and privacy concerns of performing a virtual visit and the availability of in person appointments. I also reviewed that there may be a patient responsible charge related to this service. The patient agreed to proceed.   Interactive audio and video telecommunications were attempted between myself and PERKINS MOLINA, however failed due to patient having technical difficulties OR patient not having access to video capability.  We continued and completed visit with audio only.  Time: 11:20am - 11:49am   Patient location: home Provider location: [place] at [facility], office Persons participating in this virtual visit: patient, provider   If any vitals were documented, they were collected by patient at home unless specified below.    BP (!) 142/62   Pulse (!) 57   Ht 5\' 8"  (1.727 m)   Wt 172 lb (78 kg)   BMI 26.15 kg/m    CC: discuss medications Subjective:   HPI: Joseph Graham is a 80 y.o. male presenting on 11/29/2020 for Discuss Medications (Wants to discuss hydrocodone-APAP, gabapentin and primidone.)   Known severe O2 dependent COPD - didn't feel respiratory medications were helpful. Limited ambulation/mobility due to this.   Chronic low back pain managed with lidoderm patches with PRN norco (3-4 times a day). This is followed by oncology palliative care. Also was previously taking gabapentin 300mg  TID for chemo-induced neuropathy (this is a drop in dose). Fentanyl patches were unaffordable.   Known h/o primary bladder and lung cancers.   Saw neurology Manuella Ghazi) placed on primidone for tremors as well as high dose gabapentin for  neuropathy.   Concerns over current medications - has tapered off gabapentin, primidone.  Flashing lights in vision like tin foil - this has improved with taper off gabapentin and primidone.  Gabapentin and primidone was causing oversedation.  Fully off gabapentin and primidone - and feeling much better regarding sedation.  Upcoming virtual visit with neurology NP.   He had vision exam with new glasses 2 months ago. Followed for macular degeneration.   Large blistering shin wound  - established with wound care clinic 10/2020 Dellia Nims) - wound healing very well. Using lasix PRN.      Relevant past medical, surgical, family and social history reviewed and updated as indicated. Interim medical history since our last visit reviewed. Allergies and medications reviewed and updated. Outpatient Medications Prior to Visit  Medication Sig Dispense Refill   albuterol (PROAIR HFA) 108 (90 Base) MCG/ACT inhaler Inhale 2 puffs into the lungs every 6 (six) hours as needed for wheezing or shortness of breath. 18 g 3   ASPIRIN 81 PO Take 1 tablet by mouth daily.     azelastine (ASTELIN) 0.1 % nasal spray Place 1 spray into both nostrils 2 (two) times daily. Use in each nostril as directed 30 mL 12   furosemide (LASIX) 40 MG tablet TAKE 1 TABLET(40 MG) BY MOUTH DAILY FOR 5 DAYS THEN AS NEEDED 90 tablet 0   lisinopril (ZESTRIL) 10 MG tablet TAKE 1 TABLET BY MOUTH EVERY EVENING 90 tablet 0   metoprolol tartrate (LOPRESSOR) 25 MG tablet TAKE 1/2 TABLET BY MOUTH TWICE DAILY 90 tablet 0   Multiple Vitamin (MULTIVITAMIN  WITH MINERALS) TABS tablet Take 1 tablet by mouth every evening.     naloxone (NARCAN) nasal spray 4 mg/0.1 mL SPRAY 1 SPRAY INTO ONE NOSTRIL AS DIRECTED FOR OPIOID OVERDOSE (TURN PERSON ON SIDE AFTER DOSE. IF NO RESPONSE IN 2-3 MINUTES OR PERSON RESPONDS BUT RELAPSES, REPEAT USING A NEW SPRAY DEVICE AND SPRAY INTO THE OTHER NOSTRIL. CALL 911 AFTER USE.) * EMERGENCY USE ONLY * 1 each 0   NON FORMULARY  Take 500 mg by mouth daily. Hemp Oil 500 mg     omeprazole (PRILOSEC) 20 MG capsule Take 20 mg by mouth every morning.      OXYGEN Inhale 2 L into the lungs daily. Using 4 L     oxymetazoline (AFRIN) 0.05 % nasal spray Place 1 spray into both nostrils 2 (two) times daily. As needed     polyethylene glycol (MIRALAX / GLYCOLAX) packet Take 17 g by mouth daily.     potassium chloride (KLOR-CON) 10 MEQ tablet TAKE 1 TABLET BY MOUTH DAILY WHILE ON LASIX 90 tablet 0   vitamin B-12 (CYANOCOBALAMIN) 100 MCG tablet Take 100 mcg by mouth every evening.     vitamin E 400 UNIT capsule Take 400 Units by mouth every evening.      fentaNYL (DURAGESIC) 12 MCG/HR Place 1 patch onto the skin every 3 (three) days. 5 patch 0   HYDROcodone-acetaminophen (NORCO/VICODIN) 5-325 MG tablet Take 1 tablet by mouth every 6 (six) hours as needed for moderate pain. 90 tablet 0   gabapentin (NEURONTIN) 300 MG capsule Take 1 capsule (300 mg total) by mouth daily as needed (neuropathy, tremors).     gabapentin (NEURONTIN) 300 MG capsule Take 1 capsule (300 mg total) by mouth 3 (three) times daily. (Patient not taking: Reported on 11/29/2020)     primidone (MYSOLINE) 50 MG tablet Take 2 tablets (100 mg total) by mouth 2 (two) times daily. (Patient not taking: Reported on 11/29/2020)     No facility-administered medications prior to visit.     Per HPI unless specifically indicated in ROS section below Review of Systems Objective:  BP (!) 142/62   Pulse (!) 57   Ht 5\' 8"  (1.727 m)   Wt 172 lb (78 kg)   BMI 26.15 kg/m   Wt Readings from Last 3 Encounters:  11/29/20 172 lb (78 kg)  10/17/20 178 lb (80.7 kg)  09/30/20 180 lb (81.6 kg)       Physical exam: Deferred due to phone visit     Results for orders placed or performed during the hospital encounter of 10/03/20  I-STAT creatinine  Result Value Ref Range   Creatinine, Ser 0.60 (L) 0.61 - 1.24 mg/dL   Assessment & Plan:   Problem List Items Addressed This Visit      Peripheral neuropathy due to chemotherapy ([place]) (Chronic)    Has tapered off gabapentin/primodone with improvement in sedation/fatigue etc.  Will remain on gabapentin 300mg  PRN.  Encouraged neuro f/u already scheduled       Relevant Medications   gabapentin (NEURONTIN) 300 MG capsule   DDD (degenerative disc disease), lumbar (Chronic)    Chronic pain stemming from known DDD, managing with lidoderm patches and prn hydrocodone.        Mixed sensory-motor polyneuropathy (Chronic)   Relevant Medications   gabapentin (NEURONTIN) 300 MG capsule   Chronic pain syndrome (Chronic)   Relevant Medications   gabapentin (NEURONTIN) 300 MG capsule   Benign essential tremor    Has fully tapered himself  off gabapentin/primidone due to concerns over side effects to medication - oversedation, vision changes, fatigue. Feeling  Better off this.        COPD, severe ([place])    Severe oxygen dependent COPD but off respiratory medications - this limits ambulation.        Macular degeneration of both eyes    Regularly sees ophthalmology, recent eye exam with new glasses        Overweight    Ongoing weight loss noted.        Polypharmacy - Primary    Reviewed medication concerns.  He feels better having tapered down on gabapentin and primidone.        Chronic respiratory failure with hypoxia (HCC)   Stasis ulcer ([place])    Followed by wound clinic - healing well. Appreciate their care.          No orders of the defined types were placed in this encounter.  No orders of the defined types were placed in this encounter.   I discussed the assessment and treatment plan with the patient. The patient was provided an opportunity to ask questions and all were answered. The patient agreed with the plan and demonstrated an understanding of the instructions. The patient was advised to call back or seek an in-person evaluation if the symptoms worsen or if the condition fails to improve as  anticipated.  Follow up plan: No follow-ups on file.  [name], MD

## 2020-12-01 DIAGNOSIS — J9611 Chronic respiratory failure with hypoxia: Secondary | ICD-10-CM | POA: Diagnosis not present

## 2020-12-01 DIAGNOSIS — R0600 Dyspnea, unspecified: Secondary | ICD-10-CM | POA: Diagnosis not present

## 2020-12-01 DIAGNOSIS — J9621 Acute and chronic respiratory failure with hypoxia: Secondary | ICD-10-CM | POA: Diagnosis not present

## 2020-12-01 DIAGNOSIS — J95811 Postprocedural pneumothorax: Secondary | ICD-10-CM | POA: Diagnosis not present

## 2020-12-01 DIAGNOSIS — R0602 Shortness of breath: Secondary | ICD-10-CM | POA: Diagnosis not present

## 2020-12-01 NOTE — Assessment & Plan Note (Signed)
Ongoing weight loss noted.  

## 2020-12-01 NOTE — Assessment & Plan Note (Signed)
Followed by wound clinic - healing well. Appreciate their care.

## 2020-12-01 NOTE — Assessment & Plan Note (Signed)
Severe oxygen dependent COPD but off respiratory medications - this limits ambulation.

## 2020-12-01 NOTE — Assessment & Plan Note (Signed)
Regularly sees ophthalmology, recent eye exam with new glasses

## 2020-12-01 NOTE — Assessment & Plan Note (Signed)
Has fully tapered himself off gabapentin/primidone due to concerns over side effects to medication - oversedation, vision changes, fatigue. Feeling  Better off this.

## 2020-12-01 NOTE — Assessment & Plan Note (Signed)
Chronic pain stemming from known DDD, managing with lidoderm patches and prn hydrocodone.

## 2020-12-01 NOTE — Assessment & Plan Note (Addendum)
Has tapered off gabapentin/primodone with improvement in sedation/fatigue etc.  Will remain on gabapentin 300mg  PRN.  Encouraged neuro f/u already scheduled

## 2020-12-01 NOTE — Assessment & Plan Note (Signed)
Reviewed medication concerns.  He feels better having tapered down on gabapentin and primidone.

## 2020-12-19 ENCOUNTER — Encounter: Payer: Self-pay | Admitting: Internal Medicine

## 2020-12-19 ENCOUNTER — Telehealth: Payer: Self-pay | Admitting: Internal Medicine

## 2020-12-19 NOTE — Telephone Encounter (Signed)
Patient left vm requesting a call back from the team as he would like to cancel his PET scheduled on 7/13.  Routing to clinical team for follow up.

## 2020-12-20 ENCOUNTER — Other Ambulatory Visit: Payer: Self-pay | Admitting: *Deleted

## 2020-12-20 MED ORDER — HYDROCODONE-ACETAMINOPHEN 5-325 MG PO TABS: 1.0000 | ORAL_TABLET | Freq: Four times a day (QID) | ORAL | 0 refills | Status: DC | PRN

## 2020-12-20 NOTE — Telephone Encounter (Signed)
Patient requesting refill for Norco/Vicodin order pended for MD NP approval.

## 2020-12-21 ENCOUNTER — Ambulatory Visit: Payer: Medicare Other

## 2020-12-22 ENCOUNTER — Inpatient Hospital Stay: Payer: Medicare Other | Attending: Internal Medicine | Admitting: Internal Medicine

## 2020-12-22 ENCOUNTER — Other Ambulatory Visit: Payer: Self-pay | Admitting: Family Medicine

## 2020-12-22 DIAGNOSIS — C3411 Malignant neoplasm of upper lobe, right bronchus or lung: Secondary | ICD-10-CM | POA: Diagnosis not present

## 2020-12-22 NOTE — Assessment & Plan Note (Addendum)
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   # RLL-1.5cm-peripheral continues to grow over the last few years.- s/p SBRT [finished Dec 15th, 2021]-CT scan April 2022-extensive radiation changes along the right lower lobe. STABLE  #Right lower lobe lung nodule/subcarinal lymph node progressive-question for recurrent malignancy/progressive malignancy based upon CT scan in April 2022.  Patient cannot tolerate PET scan because of back pain/length of duration.  We will get a CT scan for further evaluation-will be ordered to be done in the next 1 to 2 weeks.  #Spontaneous pneumothorax x2 status post pleurodesis-currently clinically-stable   # Peripheral neuropathy/back pain-stable; on hydrocodone/oxycodone- defer to [facility] for pain management. Also continue Neurontin.   # COPD on O2/6 lit nasal cannula-stable  # Bladder cancer- superficial- stable    will call  # DISPOSITION: # schedule CT scan- next week # follow up TBD-Dr.B  # I reviewed the blood work- with the patient in detail; also reviewed the imaging independently [as summarized above]; and with the patient in detail.

## 2020-12-22 NOTE — Progress Notes (Signed)
I connected with [name] on 12/22/20 at 10:45 AM EDT by video enabled telemedicine visit and verified that I am speaking with the correct person using two identifiers.  I discussed the limitations, risks, security and privacy concerns of performing an evaluation and management service by telemedicine and the availability of in-person appointments. I also discussed with the patient that there may be a patient responsible charge related to this service. The patient expressed understanding and agreed to proceed.    Other persons participating in the visit and their role in the encounter: RN/medical reconciliation Patient's location: home Provider's location: office  Oncology History Overview Note   # OCT 2016- RUL POORLY DIFF CA [clinically non-small cell] ; STAGE IIB [T3-2 separate nodules in RUL; N0] [s/p CT guided bx]- s/p RT [finished mid jan 2017]; FEB 2017-CT scan- regression of RUL nodules;   # March 2017- Start carbo-taxol q 3 w x4; June 2017- CT- Improvement of RUL nodule/ Stable RUL nodules. OCT 2017- PR   # OCT 2016- 2 sub cm nodules in Right LL- Feb CT 2017- STABLE; June 2017- resolved  # May- 2017- G2 PN  # OCT 2016-NON-invasive bladder urothelial cancer; high grade [Dr. [name]; s/p TURBT [Feb 2017- NEG]; declined BCG  #   # Pneumothorax [oct 2016 s/p CT Bx];  # MOLECULAR STUDIES- PDL-1 by IHC/keytruda- 11-20%.  -----------------------------------------    DIAGNOSIS: RUL Lung cancer  STAGE:   II ; GOALS: control  CURRENT/MOST RECENT THERAPY: Surveillance      Malignant neoplasm of right upper lobe of lung ([place])  12/20/2015 Initial Diagnosis   Malignant neoplasm of right upper lobe of lung ([place])       Chief Complaint: Lung cancer  History of present illness:Joseph Graham 80 y.o.  male with history of advanced COPD history of spontaneous pneumothorax and also recurrent local lung cancer-s/p SBRT also-currently on surveillance is here for  follow-up.  Patient declined to get the PET scan done given the length of the test/difficulty lying on his back because of pain.   Patient has chronic shortness of breath not any worse.  He continues to be on 6 L of oxygen.  Denies any recent hospitalizations.  Denies any headaches.  Denies any nausea vomiting.  Patient has chronic back pain/chronic neuropathy-needed to be on narcotic pain medication.  Observation/objective: Alert & oriented x 3. In No acute distress.   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   # RLL-1.5cm-peripheral continues to grow over the last few years.- s/p SBRT [finished Dec 15th, 2021]-CT scan April 2022-extensive radiation changes along the right lower lobe. STABLE  #Right lower lobe lung nodule/subcarinal lymph node progressive-question for recurrent malignancy/progressive malignancy based upon CT scan in April 2022.  Patient cannot tolerate PET scan because of back pain/length of duration.  We will get a CT scan for further evaluation-will be ordered to be done in the next 1 to 2 weeks.  #Spontaneous pneumothorax x2 status post pleurodesis-currently clinically-stable   # Peripheral neuropathy/back pain-stable; on hydrocodone/oxycodone- defer to [facility] for pain management. Also continue Neurontin.   # COPD on O2/6 lit nasal cannula-stable  # Bladder cancer- superficial- stable    will call  # DISPOSITION: # schedule CT scan- next week # follow up TBD-Dr.B  # I reviewed the blood work- with  the patient in detail; also reviewed the imaging independently [as summarized above]; and with the patient in detail.    Follow-up instructions:  I discussed the assessment and treatment plan with the patient.  The patient was provided an opportunity to ask questions and all were  answered.  The patient agreed with the plan and demonstrated understanding of instructions.  The patient was advised to call back or seek an in person evaluation if the symptoms worsen or if the condition fails to improve as anticipated.   Dr. Charlaine Dalton [facility] at [facility] 12/22/2020 2:52 PM

## 2020-12-24 ENCOUNTER — Encounter: Payer: Self-pay | Admitting: Radiation Oncology

## 2020-12-28 ENCOUNTER — Ambulatory Visit: Payer: Medicare Other | Admitting: Radiation Oncology

## 2020-12-30 ENCOUNTER — Other Ambulatory Visit: Payer: Self-pay | Admitting: Family Medicine

## 2020-12-31 DIAGNOSIS — J9621 Acute and chronic respiratory failure with hypoxia: Secondary | ICD-10-CM | POA: Diagnosis not present

## 2020-12-31 DIAGNOSIS — J9611 Chronic respiratory failure with hypoxia: Secondary | ICD-10-CM | POA: Diagnosis not present

## 2020-12-31 DIAGNOSIS — R0602 Shortness of breath: Secondary | ICD-10-CM | POA: Diagnosis not present

## 2020-12-31 DIAGNOSIS — R0600 Dyspnea, unspecified: Secondary | ICD-10-CM | POA: Diagnosis not present

## 2020-12-31 DIAGNOSIS — J95811 Postprocedural pneumothorax: Secondary | ICD-10-CM | POA: Diagnosis not present

## 2021-01-05 ENCOUNTER — Ambulatory Visit
Admission: RE | Admit: 2021-01-05 | Discharge: 2021-01-05 | Disposition: A | Discharge status: Home / Self Care | Payer: Medicare Other | Source: Ambulatory Visit | Attending: Internal Medicine | Admitting: Internal Medicine

## 2021-01-05 ENCOUNTER — Other Ambulatory Visit: Payer: Self-pay

## 2021-01-05 DIAGNOSIS — R918 Other nonspecific abnormal finding of lung field: Secondary | ICD-10-CM | POA: Diagnosis not present

## 2021-01-05 DIAGNOSIS — C3411 Malignant neoplasm of upper lobe, right bronchus or lung: Secondary | ICD-10-CM | POA: Diagnosis not present

## 2021-01-05 DIAGNOSIS — I700 Atherosclerosis of aorta: Secondary | ICD-10-CM | POA: Diagnosis not present

## 2021-01-05 DIAGNOSIS — J439 Emphysema, unspecified: Secondary | ICD-10-CM | POA: Diagnosis not present

## 2021-01-05 DIAGNOSIS — C3490 Malignant neoplasm of unspecified part of unspecified bronchus or lung: Secondary | ICD-10-CM | POA: Diagnosis not present

## 2021-01-05 DIAGNOSIS — R911 Solitary pulmonary nodule: Secondary | ICD-10-CM | POA: Diagnosis not present

## 2021-01-05 LAB — POCT I-STAT CREATININE: Creatinine, Ser: 0.6 mg/dL — ABNORMAL LOW (ref 0.61–1.24)

## 2021-01-05 MED ORDER — IOHEXOL 350 MG/ML SOLN
75.0000 mL | Freq: Once | INTRAVENOUS | Status: AC | PRN
  Administered 2021-01-05: 75 mL via INTRAVENOUS

## 2021-01-06 ENCOUNTER — Telehealth: Payer: Self-pay | Admitting: Radiation Oncology

## 2021-01-06 ENCOUNTER — Ambulatory Visit
Admission: RE | Admit: 2021-01-06 | Discharge: 2021-01-06 | Disposition: A | Discharge status: Home / Self Care | Payer: Medicare Other | Source: Ambulatory Visit | Attending: Radiation Oncology | Admitting: Radiation Oncology

## 2021-01-06 DIAGNOSIS — Z85118 Personal history of other malignant neoplasm of bronchus and lung: Secondary | ICD-10-CM | POA: Diagnosis not present

## 2021-01-06 DIAGNOSIS — Z08 Encounter for follow-up examination after completed treatment for malignant neoplasm: Secondary | ICD-10-CM | POA: Diagnosis not present

## 2021-01-06 NOTE — Telephone Encounter (Signed)
Patient left a VM requesting to reschedule his MyChart appt on 7/29. Patient stated that he didn't feel like his CT results were back in time.

## 2021-01-09 NOTE — Progress Notes (Signed)
Radiation Oncology TeleHEALTH VISIT PROGRESS NOTE  I connected with      Joseph Graham    by telephone-[place] and verified that I am speaking with the correct person using two identifiers.  I discussed the limitations, risks, security and privacy concerns of performing an evaluation and management service by telemedicine and the availability of in-person appointments. I also discussed with the patient that there may be a patient responsible charge related to this service. The patient expressed understanding and agreed to proceed.    Other persons participating in the visit and their role in the encounter:    Patient's location: Home   Provider's location: work  Radiation Oncology Follow up Note  Name: Joseph Graham   Date:   01/06/2021 MRN:  [id number] DOB: 25-Apr-1941    This 80 y.o. male presents by phone consultation for follow-up now at 7 months completing SBRT to his right lower lobe in patient previous treated to his right upper lobe for poorly differentiated carcinoma non-small cell cancer with 2 separate foci REFERRING PROVIDER: [name], MD  HPI: Patient is an 80 year old male now out 7 months having completed SBRT to his right lower lobe for non-small cell lung cancer.  He is doing well he states his pulmonary status is stable is having no cough hemoptysis chest tightness.  He had a recent CT scan.  Showing evolving a post radiation masslike fibrosis in the right lower lobe.  There are small pulmonary nodules throughout both lungs stable compartment compared to prior exam.  COMPLICATIONS OF TREATMENT: none  FOLLOW UP COMPLIANCE: keeps appointments   PHYSICAL EXAM:  There were no vitals taken for this visit. No examination was performed this was a telephone conference  RADIOLOGY RESULTS: CT scans reviewed compatible with above-stated findings  PLAN: Present time patient is doing well with no evidence of disease and stable CT scan of the chest.  And pleased with his  overall progress.  I have asked to see him out in 1 year.  He continues close follow-up care with medical oncology we will see him in the next several weeks.  Patient knows to call with any concerns.  I would like to take this opportunity to thank you for allowing me to participate in the care of your patient.Noreene Filbert, MD

## 2021-01-11 ENCOUNTER — Telehealth: Payer: Self-pay | Admitting: Internal Medicine

## 2021-01-11 ENCOUNTER — Other Ambulatory Visit: Payer: Self-pay | Admitting: *Deleted

## 2021-01-11 DIAGNOSIS — C3411 Malignant neoplasm of upper lobe, right bronchus or lung: Secondary | ICD-10-CM

## 2021-01-11 MED ORDER — HYDROCODONE-ACETAMINOPHEN 5-325 MG PO TABS: 1.0000 | ORAL_TABLET | Freq: Four times a day (QID) | ORAL | 0 refills | Status: DC | PRN

## 2021-01-11 NOTE — Telephone Encounter (Signed)
On 08/03- spoke to pt re: results of the CT scan- overall STABLE.  Patient concerned about laying on the table for imaging because of back pain.  Feels the imaging 12 months were appropriate.  Recommend follow up in en of July 2022- VIDEO-  MD; CT chest prior-coordinate with Dr.Chrystal appt.  Pt will continue to follow up [place]- re: palliative care/pain medication refills etc.  GB.

## 2021-01-11 NOTE — Telephone Encounter (Signed)
Patient called stating that he is confused because Dr B is not showing up on his medical team in his MyChart account. He also states that he has not heard anything from Dr B since his CT scan and he has no follow up appointment scheduled. He also reports that he needs his pain medicine refilled. Please advise.  Study Result  Narrative & Impression  CLINICAL DATA:  80 year old male with history of non-small cell lung cancer diagnosed in 2016 status post chemotherapy and radiation therapy (completed 4 months ago). Follow-up study to assess for treatment response.   EXAM: CT CHEST WITH CONTRAST   TECHNIQUE: Multidetector CT imaging of the chest was performed during intravenous contrast administration.   CONTRAST:  83mL OMNIPAQUE IOHEXOL 350 MG/ML SOLN   COMPARISON:  Chest CT 10/03/2020.   FINDINGS: Cardiovascular: Heart size is normal. There is no significant pericardial fluid, thickening or pericardial calcification. There is aortic atherosclerosis, as well as atherosclerosis of the great vessels of the mediastinum and the coronary arteries, including calcified atherosclerotic plaque in the left main, left anterior descending, left circumflex and right coronary arteries. Calcification of the aortic valve and mitral annulus. Aberrant right subclavian artery with small diverticulum of Kom[place] measuring 1.9 cm in diameter.   Mediastinum/Nodes: No pathologically enlarged mediastinal or hilar lymph nodes. Esophagus is unremarkable in appearance. No axillary lymphadenopathy.   Lungs/Pleura: Extensive mass-like area of architectural distortion in the posterior aspect of the right lower lobe, similar to the most recent prior study from 10/03/2020, compatible with an area of evolving postradiation mass-like fibrosis. Several other scattered areas of nodularity are noted in the lungs bilaterally, stable compared to the most recent prior examination. The largest of these is in the apex  of the right upper lobe (axial image 28 of series 3) has some adjacent fiducial markers and measures 1.6 x 1.2 cm, which is unchanged. Another prominent nodule in the right upper lobe laterally (axial image 74 of series 3) measures 1.3 x 0.8 cm, also unchanged. No other new suspicious appearing pulmonary nodules or masses are noted. No acute consolidative airspace disease. No definite pleural effusions. Diffuse bronchial wall thickening with moderate centrilobular and paraseptal emphysema.   Upper Abdomen: Aortic atherosclerosis. Subcentimeter low-attenuation lesion in the left lobe of the liver, too small to characterize, but similar to the prior study and statistically likely to represent a tiny cyst. Several noncalcified gallstones lie dependently in the gallbladder. No findings to suggest an acute cholecystitis in the visualized portions of the abdomen. Colonic diverticulosis noted in the transverse colon.   Musculoskeletal: There are no aggressive appearing lytic or blastic lesions noted in the visualized portions of the skeleton. Old healed fractures of the anterolateral sixth and seventh ribs.   IMPRESSION: 1. Evolving postradiation mass-like fibrosis in the right lower lobe. Multiple small pulmonary nodules scattered throughout both lungs, stable compared to the prior examination. Continued attention on follow-up studies is recommended. 2. Diffuse bronchial wall thickening with moderate centrilobular and paraseptal emphysema; imaging findings suggestive of underlying COPD. 3. Aortic atherosclerosis, in addition to left main and 3 vessel coronary artery disease. 4. There are calcifications of the aortic valve and mitral annulus. Echocardiographic correlation for evaluation of potential valvular dysfunction may be warranted if clinically indicated. 5. Cholelithiasis. 6. Colonic diverticulosis. 7. Additional incidental findings, as above.   Aortic Atherosclerosis  (ICD10-I70.0) and Emphysema (ICD10-J43.9).     Electronically Signed   By: Vinnie Langton M.D.   On: 01/07/2021 07:51

## 2021-01-12 NOTE — Telephone Encounter (Signed)
Dr. B did you mean to say end of July 2023?

## 2021-01-12 NOTE — Telephone Encounter (Signed)
What about his concerns of no appointment with Dr B and his CT results?

## 2021-01-12 NOTE — Telephone Encounter (Signed)
I have taken care of that this morning we are good not due back till a year

## 2021-01-12 NOTE — Telephone Encounter (Signed)
Yes all apts in 1 year

## 2021-01-17 ENCOUNTER — Telehealth: Payer: Self-pay

## 2021-01-17 NOTE — Chronic Care Management (AMB) (Addendum)
Chronic Care Management Pharmacy Assistant   Name: Joseph Graham  MRN: [id number] DOB: Jun 06, 1941  Reason for Encounter: COPD Review  Recent office visits:  01/18/21 - Dr.Gutierrez PCP, video visit - Pt presented for COPD. Recommend lasix 40mg  with potassium supplement QOD. Recommend slow taper of O2 with goal oxygen 90%. Avoid vasoconstrictive effect of overuse of oxygen. Follow up 3 months for AWV. 11/29/20 - Dr.Gutierrez PCP, video visit - Pt presented for polypharmacy. Discussed weaning off gabapentin and primidone. Patient feels better off these. 10/17/20 - Dr.Gutierrez PCP, video visit - Pt presented for follow up left leg ulcer. Improved. Continues current medications.   Recent consult visits:  12/22/20 - Oncology, video visit - Pt presented to discuss lung cancer and bladder cancer. Follow up imaging to be scheduled. No medication changes. 11/23/20 - Internal Medicine - Patient presented for wound care. No medication changes. 11/11/20 - Oncology, video visit - Pt presented for follow up, lung cancer and continued back pain. Continue Norco 5-325mg  Q6H PRN #90. Continue Lidoderm 5% patches to back.  Hospital visits:  None in previous 6 months  Medications: Outpatient Encounter Medications as of 01/17/2021  Medication Sig   albuterol (PROAIR HFA) 108 (90 Base) MCG/ACT inhaler Inhale 2 puffs into the lungs every 6 (six) hours as needed for wheezing or shortness of breath.   ASPIRIN 81 PO Take 1 tablet by mouth daily.   azelastine (ASTELIN) 0.1 % nasal spray Place 1 spray into both nostrils 2 (two) times daily. Use in each nostril as directed   furosemide (LASIX) 40 MG tablet TAKE 1 TABLET(40 MG) BY MOUTH DAILY FOR 5 DAYS THEN AS NEEDED   gabapentin (NEURONTIN) 300 MG capsule Take 1 capsule (300 mg total) by mouth daily as needed (neuropathy, tremors).   HYDROcodone-acetaminophen (NORCO/VICODIN) 5-325 MG tablet Take 1-2 tablets by mouth every 6 (six) hours as needed for moderate pain.    lisinopril (ZESTRIL) 10 MG tablet TAKE 1 TABLET BY MOUTH EVERY EVENING   metoprolol tartrate (LOPRESSOR) 25 MG tablet TAKE 1/2 TABLET BY MOUTH TWICE DAILY   Multiple Vitamin (MULTIVITAMIN WITH MINERALS) TABS tablet Take 1 tablet by mouth every evening.   naloxone (NARCAN) nasal spray 4 mg/0.1 mL SPRAY 1 SPRAY INTO ONE NOSTRIL AS DIRECTED FOR OPIOID OVERDOSE (TURN PERSON ON SIDE AFTER DOSE. IF NO RESPONSE IN 2-3 MINUTES OR PERSON RESPONDS BUT RELAPSES, REPEAT USING A NEW SPRAY DEVICE AND SPRAY INTO THE OTHER NOSTRIL. CALL 911 AFTER USE.) * EMERGENCY USE ONLY *   NON FORMULARY Take 500 mg by mouth daily. Hemp Oil 500 mg   omeprazole (PRILOSEC) 20 MG capsule Take 20 mg by mouth every morning.    OXYGEN Inhale 2 L into the lungs daily. Using 4 L   oxymetazoline (AFRIN) 0.05 % nasal spray Place 1 spray into both nostrils 2 (two) times daily. As needed   polyethylene glycol (MIRALAX / GLYCOLAX) packet Take 17 g by mouth daily.   potassium chloride (KLOR-CON) 10 MEQ tablet TAKE 1 TABLET BY MOUTH DAILY WHILE ON LASIX   vitamin B-12 (CYANOCOBALAMIN) 100 MCG tablet Take 100 mcg by mouth every evening.   vitamin E 400 UNIT capsule Take 400 Units by mouth every evening.    No facility-administered encounter medications on file as of 01/17/2021.   Social History   Tobacco Use  Smoking Status Former   Packs/day: 1.50   Years: 61.00   Pack years: 91.50   Types: Cigarettes   Quit date: 03/08/2015  Years since quitting: 5.9  Smokeless Tobacco Never  Tobacco Comments   cut back 0.5 cigarettes--stopped 03/07/15; now now chantix    Contacted patient to discuss COPD disease state Current COPD regimen: Albuterol PRN  Any recent hospitalizations or ED visits since last visit with CPP? No  denies COPD symptoms, including Increased shortness of breath , Rescue medicine is not helping, Shortness of breath at rest, Symptoms worse with exercise, Symptoms worse at night, and Wheezing   What recent  interventions/DTPs have been made by any provider to improve breathing since last visit: 4/20/222 - PCP - Pt didn't feel respiratory medications provided benefit so off these. Consider retrial in the future.  d Have you had exacerbation/flare-up since last visit? No  What do you do when you are short of breath?  Rescue medication and Rest  Current tobacco use? Yes 11/29/20 Interested in cessation? No  Respiratory Devices/Equipment Do you have a nebulizer? No Do you use a Peak Flow Meter? No Do you use a maintenance inhaler? No How often do you forget to use your daily inhaler? N/A Do you use a rescue inhaler? Yes How often do you use your rescue inhaler?   When there is emergency event Do you use a spacer with your inhaler? No  Adherence Review: Does the patient have >5 day gap between last estimated fill date for maintenance inhaler medications? None prescribed  Albuterol HFA 108  12/28/20     50DS  Upcoming appts: PCP appointment on 01/18/2021  video visit  [facility] Medication Name  Last Fill Date  Day supply Lisinopril 10mg   12/30/20   [place], CPP notified  Avel Sensor, [place] Assistant [phone]  I have reviewed the care management and care coordination activities outlined in this encounter and I am certifying that I agree with the content of this note. Scheduled CCM visit for Nov 2022.  Debbora Dus, PharmD Clinical Pharmacist [facility] at [facility] [phone]

## 2021-01-18 ENCOUNTER — Other Ambulatory Visit: Payer: Self-pay

## 2021-01-18 ENCOUNTER — Encounter: Payer: Self-pay | Admitting: Family Medicine

## 2021-01-18 ENCOUNTER — Telehealth (INDEPENDENT_AMBULATORY_CARE_PROVIDER_SITE_OTHER): Payer: Medicare Other | Admitting: Family Medicine

## 2021-01-18 VITALS — BP 143/68 | HR 64 | Ht 68.0 in | Wt 169.0 lb

## 2021-01-18 DIAGNOSIS — T451X5A Adverse effect of antineoplastic and immunosuppressive drugs, initial encounter: Secondary | ICD-10-CM

## 2021-01-18 DIAGNOSIS — G620 Drug-induced polyneuropathy: Secondary | ICD-10-CM | POA: Diagnosis not present

## 2021-01-18 DIAGNOSIS — Q2549 Other congenital malformations of aorta: Secondary | ICD-10-CM | POA: Diagnosis not present

## 2021-01-18 DIAGNOSIS — J449 Chronic obstructive pulmonary disease, unspecified: Secondary | ICD-10-CM | POA: Diagnosis not present

## 2021-01-18 DIAGNOSIS — I700 Atherosclerosis of aorta: Secondary | ICD-10-CM

## 2021-01-18 DIAGNOSIS — C3411 Malignant neoplasm of upper lobe, right bronchus or lung: Secondary | ICD-10-CM | POA: Diagnosis not present

## 2021-01-18 DIAGNOSIS — R600 Localized edema: Secondary | ICD-10-CM | POA: Diagnosis not present

## 2021-01-18 DIAGNOSIS — G250 Essential tremor: Secondary | ICD-10-CM | POA: Diagnosis not present

## 2021-01-18 DIAGNOSIS — J9611 Chronic respiratory failure with hypoxia: Secondary | ICD-10-CM

## 2021-01-18 MED ORDER — FUROSEMIDE 40 MG PO TABS: 40.0000 mg | ORAL_TABLET | ORAL | 1 refills | Status: DC

## 2021-01-18 MED ORDER — POTASSIUM CHLORIDE ER 10 MEQ PO TBCR: 10.0000 meq | EXTENDED_RELEASE_TABLET | ORAL | 1 refills | Status: DC

## 2021-01-18 NOTE — Assessment & Plan Note (Addendum)
Now only on gabapentin 100mg  PRN - with infrequent use.  Has been released from neuro care.

## 2021-01-18 NOTE — Assessment & Plan Note (Addendum)
Has fully tapered off high dose gabapentin and primidone, now only on gabapentin 100mg  PRN - with infrequent use.  Has been released from neuro care.

## 2021-01-18 NOTE — Progress Notes (Signed)
Patient ID: Joseph Graham, male    DOB: 06/20/1940, 80 y.o.   MRN: [id number]  Virtual visit completed through [place], a video enabled telemedicine application. Due to national recommendations of social distancing due to COVID-19, a virtual visit is felt to be most appropriate for this patient at this time. Reviewed limitations, risks, security and privacy concerns of performing a virtual visit and the availability of in person appointments. I also reviewed that there may be a patient responsible charge related to this service. The patient agreed to proceed.   Patient location: home Provider location: [place] at [facility], office Persons participating in this virtual visit: patient, provider   If any vitals were documented, they were collected by patient at home unless specified below.    BP (!) 143/68   Pulse 64   Ht 5\' 8"  (1.727 m)   Wt 169 lb (76.7 kg)   SpO2 100% Comment: 6L  BMI 25.70 kg/m    CC: 3 mo f/u visit  Subjective:   HPI: Joseph Graham is a 80 y.o. male presenting on 01/18/2021 for Follow-up (3 mo f/u.  Wants to discuss Lasix and potassium.)   Requests virtual visits as much as possible given mobility difficulty in oxygen use (6L). Agrees to return for in-person visit in 6 months for physical.  Known severe O2 dependent COPD off medications.  Known primary lung > bladder cancers.  Recently saw oncology and radiation oncology virtually - notes reviewed, opted for CT scan as could not tolerate long duration of PET scan on back - CT overall reassuring.   Recent CT scan also showed ATH of aorta, CAD, and calcification of aortic valve and mitral annulus. Incidental finding of aberrant R [place] artery with small diverticulum of Kommerell measuring 1.9cm diameter.   He has previously received steroid injections into coccyx from remote injury. Always sits on cushion.  Ongoing weight loss noted. Attributes to diuretic (lasix). He is taking 1 tablet with potassium off and  on but more regularly than not. No change in food intake. Regularly using compression socks, and on top of this wraps legs. No sores or drainage from legs with lasix on board.   COPD, known lung cancer with radiation changes to lungs - spiriva trial was not helpful. Using albuterol very seldom. Using up to 6L supplemental oxygen by [place]. Declines respiratory medication.   [place] - was followed by [facility] neurology - now off primodone and gabapentin, has been released from neurology care. Notes caffeine or stress worsens tremor. Only using gabapentin PRN.      Relevant past medical, surgical, family and social history reviewed and updated as indicated. Interim medical history since our last visit reviewed. Allergies and medications reviewed and updated. Outpatient Medications Prior to Visit  Medication Sig Dispense Refill   albuterol (PROAIR HFA) 108 (90 Base) MCG/ACT inhaler Inhale 2 puffs into the lungs every 6 (six) hours as needed for wheezing or shortness of breath. 18 g 3   ASPIRIN 81 PO Take 1 tablet by mouth daily.     azelastine (ASTELIN) 0.1 % nasal spray Place 1 spray into both nostrils 2 (two) times daily. Use in each nostril as directed 30 mL 12   HYDROcodone-acetaminophen (NORCO/VICODIN) 5-325 MG tablet Take 1-2 tablets by mouth every 6 (six) hours as needed for moderate pain. 90 tablet 0   lisinopril (ZESTRIL) 10 MG tablet TAKE 1 TABLET BY MOUTH EVERY EVENING 90 tablet 0   metoprolol tartrate (LOPRESSOR) 25 MG tablet  TAKE 1/2 TABLET BY MOUTH TWICE DAILY 90 tablet 1   Multiple Vitamin (MULTIVITAMIN WITH MINERALS) TABS tablet Take 1 tablet by mouth every evening.     naloxone (NARCAN) nasal spray 4 mg/0.1 mL SPRAY 1 SPRAY INTO ONE NOSTRIL AS DIRECTED FOR OPIOID OVERDOSE (TURN PERSON ON SIDE AFTER DOSE. IF NO RESPONSE IN 2-3 MINUTES OR PERSON RESPONDS BUT RELAPSES, REPEAT USING A NEW SPRAY DEVICE AND SPRAY INTO THE OTHER NOSTRIL. CALL 911 AFTER USE.) * EMERGENCY USE ONLY * 1 each 0   NON  FORMULARY Take 500 mg by mouth daily. Hemp Oil 500 mg     omeprazole (PRILOSEC) 20 MG capsule Take 20 mg by mouth every morning.      OXYGEN Inhale 2 L into the lungs daily. Using 4 L     oxymetazoline (AFRIN) 0.05 % nasal spray Place 1 spray into both nostrils 2 (two) times daily. As needed     polyethylene glycol (MIRALAX / GLYCOLAX) packet Take 17 g by mouth daily.     vitamin B-12 (CYANOCOBALAMIN) 100 MCG tablet Take 100 mcg by mouth every evening.     vitamin E 400 UNIT capsule Take 400 Units by mouth every evening.      furosemide (LASIX) 40 MG tablet TAKE 1 TABLET(40 MG) BY MOUTH DAILY FOR 5 DAYS THEN AS NEEDED 90 tablet 0   gabapentin (NEURONTIN) 100 MG capsule Take 100 mg by mouth 3 (three) times daily.     potassium chloride (KLOR-CON) 10 MEQ tablet TAKE 1 TABLET BY MOUTH DAILY WHILE ON LASIX 90 tablet 0   gabapentin (NEURONTIN) 100 MG capsule Take 1 capsule (100 mg total) by mouth daily as needed.     gabapentin (NEURONTIN) 300 MG capsule Take 1 capsule (300 mg total) by mouth daily as needed (neuropathy, tremors).     No facility-administered medications prior to visit.     Per HPI unless specifically indicated in ROS section below Review of Systems Objective:  BP (!) 143/68   Pulse 64   Ht 5\' 8"  (1.727 m)   Wt 169 lb (76.7 kg)   SpO2 100% Comment: 6L  BMI 25.70 kg/m   Wt Readings from Last 3 Encounters:  01/18/21 169 lb (76.7 kg)  11/29/20 172 lb (78 kg)  10/17/20 178 lb (80.7 kg)       Physical exam: Gen: alert, NAD, supplemental o2 in place Pulm: speaks in complete sentences without increased work of breathing Psych: normal mood, normal thought content      Results for orders placed or performed during the hospital encounter of 01/05/21  I-STAT creatinine  Result Value Ref Range   Creatinine, Ser 0.60 (L) 0.61 - 1.24 mg/dL   Lab Results  Component Value Date   HGBA1C 6.6 (H) 07/12/2020    Assessment & Plan:   Problem List Items Addressed This Visit      Peripheral neuropathy due to chemotherapy ([place]) (Chronic)    Now only on gabapentin 100mg  PRN - with infrequent use.  Has been released from neuro care.       Relevant Medications   gabapentin (NEURONTIN) 100 MG capsule   Benign essential tremor    Has fully tapered off high dose gabapentin and primidone, now only on gabapentin 100mg  PRN - with infrequent use.  Has been released from neuro care.       COPD, severe ([place]) - Primary    Oxygen dependent due to chronic dyspnea.  See below regarding supplemental oxygen discussion.  Didn't  feel he benefited from respiratory medication - previously tried spiriva.       Pedal edema    Chronic, overall stable on lasix 40mg  PNR which is effective - takes more days than not.  Discussed lasix dosing - recommend lasix 40mg  with potassium supplement QOD. Update with effect.       Malignant neoplasm of right upper lobe of lung (HCC)    Latest CT stable - showing only post-radiation changes to RUL.       Chronic respiratory failure with hypoxia (HCC)    Discussed O2 use - he has been using enough to maintain his oxygenation at 100% - discussed goal in chronic COPD is likely to maintain >90%, recommend slow taper of O2 use as tolerated to avoid vasoconstrictive effect of overuse of oxygen.       Diverticulum of Kommerell    Incidental finding. Monitor for dysphagia from esophageal compression (dysphagia lusoria), cough from tracheal irritation or chest pain       Relevant Medications   furosemide (LASIX) 40 MG tablet   Atherosclerosis of aorta (HCC)   Relevant Medications   furosemide (LASIX) 40 MG tablet     Meds ordered this encounter  Medications   furosemide (LASIX) 40 MG tablet    Sig: Take 1 tablet (40 mg total) by mouth every other day.    Dispense:  45 tablet    Refill:  1    **Patient requests 90 days supply**   potassium chloride (KLOR-CON) 10 MEQ tablet    Sig: Take 1 tablet (10 mEq total) by mouth every other day.     Dispense:  45 tablet    Refill:  1    **Patient requests 90 days supply**   No orders of the defined types were placed in this encounter.   I discussed the assessment and treatment plan with the patient. The patient was provided an opportunity to ask questions and all were answered. The patient agreed with the plan and demonstrated an understanding of the instructions. The patient was advised to call back or seek an in-person evaluation if the symptoms worsen or if the condition fails to improve as anticipated.  Follow up plan: Return in about 3 months (around 04/20/2021) for annual exam, prior fasting for blood work, medicare wellness visit.  [name], MD

## 2021-01-19 DIAGNOSIS — I700 Atherosclerosis of aorta: Secondary | ICD-10-CM | POA: Insufficient documentation

## 2021-01-19 DIAGNOSIS — Q2549 Other congenital malformations of aorta: Secondary | ICD-10-CM | POA: Insufficient documentation

## 2021-01-19 NOTE — Assessment & Plan Note (Signed)
Incidental finding. Monitor for dysphagia from esophageal compression (dysphagia lusoria), cough from tracheal irritation or chest pain

## 2021-01-19 NOTE — Assessment & Plan Note (Signed)
Chronic, overall stable on lasix 40mg  PNR which is effective - takes more days than not.  Discussed lasix dosing - recommend lasix 40mg  with potassium supplement QOD. Update with effect.

## 2021-01-19 NOTE — Assessment & Plan Note (Addendum)
Discussed O2 use - he has been using enough to maintain his oxygenation at 100% - discussed goal in chronic COPD is likely to maintain >90%, recommend slow taper of O2 use as tolerated to avoid vasoconstrictive effect of overuse of oxygen.

## 2021-01-19 NOTE — Assessment & Plan Note (Signed)
Oxygen dependent due to chronic dyspnea.  See below regarding supplemental oxygen discussion.  Didn't feel he benefited from respiratory medication - previously tried spiriva.

## 2021-01-19 NOTE — Assessment & Plan Note (Signed)
Latest CT stable - showing only post-radiation changes to RUL.

## 2021-01-20 DIAGNOSIS — H353211 Exudative age-related macular degeneration, right eye, with active choroidal neovascularization: Secondary | ICD-10-CM | POA: Diagnosis not present

## 2021-01-20 DIAGNOSIS — H353222 Exudative age-related macular degeneration, left eye, with inactive choroidal neovascularization: Secondary | ICD-10-CM | POA: Diagnosis not present

## 2021-01-30 ENCOUNTER — Encounter: Payer: Self-pay | Admitting: Hospice and Palliative Medicine

## 2021-01-31 ENCOUNTER — Other Ambulatory Visit: Payer: Self-pay | Admitting: Family Medicine

## 2021-01-31 ENCOUNTER — Other Ambulatory Visit: Payer: Self-pay | Admitting: Hospice and Palliative Medicine

## 2021-01-31 DIAGNOSIS — J9611 Chronic respiratory failure with hypoxia: Secondary | ICD-10-CM | POA: Diagnosis not present

## 2021-01-31 DIAGNOSIS — R0600 Dyspnea, unspecified: Secondary | ICD-10-CM | POA: Diagnosis not present

## 2021-01-31 DIAGNOSIS — R0602 Shortness of breath: Secondary | ICD-10-CM | POA: Diagnosis not present

## 2021-01-31 DIAGNOSIS — J95811 Postprocedural pneumothorax: Secondary | ICD-10-CM | POA: Diagnosis not present

## 2021-01-31 DIAGNOSIS — J9621 Acute and chronic respiratory failure with hypoxia: Secondary | ICD-10-CM | POA: Diagnosis not present

## 2021-01-31 MED ORDER — HYDROCODONE-ACETAMINOPHEN 5-325 MG PO TABS: 1.0000 | ORAL_TABLET | Freq: Four times a day (QID) | ORAL | 0 refills | Status: DC | PRN

## 2021-02-24 ENCOUNTER — Telehealth: Payer: Self-pay | Admitting: *Deleted

## 2021-02-24 ENCOUNTER — Other Ambulatory Visit: Payer: Self-pay | Admitting: Hospice and Palliative Medicine

## 2021-02-24 MED ORDER — HYDROCODONE-ACETAMINOPHEN 7.5-325 MG PO TABS: 1.0000 | ORAL_TABLET | Freq: Four times a day (QID) | ORAL | 0 refills | Status: DC | PRN

## 2021-02-24 NOTE — Telephone Encounter (Signed)
Patient called needing his Norco refill said he thinks he has enough until Monday, the thing is though that he is using the 7.5 mg consistently due to now being off of his neuropathy medications, Gabapentin etc, and having issue with arthritis on top of his neuropathy. He states his insurance will only cover 90 tabs per month so he is needing the 7.5 mg dosing to prevent insurance coverage issues. Please advise

## 2021-02-24 NOTE — Telephone Encounter (Signed)
Patient would like to speak with you when you return to the office Monday

## 2021-02-24 NOTE — Progress Notes (Signed)
Norco refilled 7.5-325mg  1 tablet every 6 hours as needed #90

## 2021-03-03 DIAGNOSIS — J9611 Chronic respiratory failure with hypoxia: Secondary | ICD-10-CM | POA: Diagnosis not present

## 2021-03-03 DIAGNOSIS — R0600 Dyspnea, unspecified: Secondary | ICD-10-CM | POA: Diagnosis not present

## 2021-03-03 DIAGNOSIS — J95811 Postprocedural pneumothorax: Secondary | ICD-10-CM | POA: Diagnosis not present

## 2021-03-03 DIAGNOSIS — R0602 Shortness of breath: Secondary | ICD-10-CM | POA: Diagnosis not present

## 2021-03-03 DIAGNOSIS — J9621 Acute and chronic respiratory failure with hypoxia: Secondary | ICD-10-CM | POA: Diagnosis not present

## 2021-03-13 ENCOUNTER — Other Ambulatory Visit: Payer: Self-pay | Admitting: Hospice and Palliative Medicine

## 2021-03-13 ENCOUNTER — Telehealth: Payer: Self-pay

## 2021-03-13 MED ORDER — HYDROCODONE-ACETAMINOPHEN 7.5-325 MG PO TABS: 1.0000 | ORAL_TABLET | Freq: Four times a day (QID) | ORAL | 0 refills | Status: DC | PRN

## 2021-03-13 NOTE — Chronic Care Management (AMB) (Addendum)
    Chronic Care Management Pharmacy Assistant   Name: Joseph Graham  MRN: [id number] DOB: 1940-07-17   Reason for Encounter: CCM Reminder Call   Conditions to be addressed/monitored: CAD, HTN, and DMII   Medications: Outpatient Encounter Medications as of 03/13/2021  Medication Sig   ASPIRIN 81 PO Take 1 tablet by mouth daily.   azelastine (ASTELIN) 0.1 % nasal spray Place 1 spray into both nostrils 2 (two) times daily. Use in each nostril as directed   furosemide (LASIX) 40 MG tablet Take 1 tablet (40 mg total) by mouth every other day.   gabapentin (NEURONTIN) 100 MG capsule Take 1 capsule (100 mg total) by mouth daily as needed.   HYDROcodone-acetaminophen (NORCO) 7.5-325 MG tablet Take 1 tablet by mouth every 6 (six) hours as needed for moderate pain.   lisinopril (ZESTRIL) 10 MG tablet TAKE 1 TABLET BY MOUTH EVERY EVENING   metoprolol tartrate (LOPRESSOR) 25 MG tablet TAKE 1/2 TABLET BY MOUTH TWICE DAILY   Multiple Vitamin (MULTIVITAMIN WITH MINERALS) TABS tablet Take 1 tablet by mouth every evening.   naloxone (NARCAN) nasal spray 4 mg/0.1 mL SPRAY 1 SPRAY INTO ONE NOSTRIL AS DIRECTED FOR OPIOID OVERDOSE (TURN PERSON ON SIDE AFTER DOSE. IF NO RESPONSE IN 2-3 MINUTES OR PERSON RESPONDS BUT RELAPSES, REPEAT USING A NEW SPRAY DEVICE AND SPRAY INTO THE OTHER NOSTRIL. CALL 911 AFTER USE.) * EMERGENCY USE ONLY *   NON FORMULARY Take 500 mg by mouth daily. Hemp Oil 500 mg   omeprazole (PRILOSEC) 20 MG capsule Take 20 mg by mouth every morning.    OXYGEN Inhale 2 L into the lungs daily. Using 4 L   oxymetazoline (AFRIN) 0.05 % nasal spray Place 1 spray into both nostrils 2 (two) times daily. As needed   polyethylene glycol (MIRALAX / GLYCOLAX) packet Take 17 g by mouth daily.   potassium chloride (KLOR-CON) 10 MEQ tablet Take 1 tablet (10 mEq total) by mouth every other day.   PROAIR HFA 108 (90 Base) MCG/ACT inhaler INHALE 2 PUFFS INTO THE LUNGS EVERY 6 HOURS AS NEEDED FOR WHEEZING OR  SHORTNESS OF BREATH   vitamin B-12 (CYANOCOBALAMIN) 100 MCG tablet Take 100 mcg by mouth every evening.   vitamin E 400 UNIT capsule Take 400 Units by mouth every evening.    No facility-administered encounter medications on file as of 03/13/2021.    [name] did not answer the telephone to remind him of his upcoming telephone visit with Debbora Dus on 03/14/21 at 1:00pm. Patient was reminded to have all medications, supplements and any blood glucose and blood pressure readings available for review at appointment by voicemail.  Star Rating Drugs: Medication:  Last Fill: Day Supply Lisinopril 10mg  12/30/20 [place], CPP notified  Avel Sensor, [place] Assistant [phone]  I have reviewed the care management and care coordination activities outlined in this encounter and I am certifying that I agree with the content of this note. No further action required.  Debbora Dus, PharmD Clinical Pharmacist [facility] at [facility] [phone]

## 2021-03-14 ENCOUNTER — Other Ambulatory Visit: Payer: Self-pay

## 2021-03-14 ENCOUNTER — Other Ambulatory Visit: Payer: Self-pay | Admitting: *Deleted

## 2021-03-14 ENCOUNTER — Encounter: Payer: Self-pay | Admitting: Hospice and Palliative Medicine

## 2021-03-14 ENCOUNTER — Ambulatory Visit (INDEPENDENT_AMBULATORY_CARE_PROVIDER_SITE_OTHER): Payer: Medicare Other

## 2021-03-14 DIAGNOSIS — E782 Mixed hyperlipidemia: Secondary | ICD-10-CM

## 2021-03-14 DIAGNOSIS — E118 Type 2 diabetes mellitus with unspecified complications: Secondary | ICD-10-CM

## 2021-03-14 DIAGNOSIS — I10 Essential (primary) hypertension: Secondary | ICD-10-CM

## 2021-03-14 MED ORDER — HYDROCODONE-ACETAMINOPHEN 7.5-325 MG PO TABS: 1.0000 | ORAL_TABLET | ORAL | 0 refills | Status: DC | PRN

## 2021-03-14 NOTE — Telephone Encounter (Signed)
Pt needs narcotic resubmitted to 1 tablet every 4 hours for pain

## 2021-03-14 NOTE — Progress Notes (Signed)
Chronic Care Management Pharmacy Note  03/14/2021 Name:  Joseph Graham MRN:  [id number] DOB:  [birth year]  Summary: CCM follow up - Patient last seen 07/2019. Pt was lost to follow up. Today reviewed medication list in detail and discussed DM, HTN, and CAD/HLD. He is due for A1c, foot and eye exam as last A1c was in diabetic range - 6.6%. Recommend home BP monitoring. Consider retrial statin or Zetia given CAD/LDL > 70. No medication concerns identified. Confirms adherence.   Recommendations/Changes made from today's visit: Home BP monitoring. Follow up with PCP for A1c.  Plan: CCM follow up 12 months BP log review in 3 months    Subjective: Joseph Graham is an 80 y.o. year old male who is a primary patient of [name], MD.  The CCM team was consulted for assistance with disease management and care coordination needs.    Engaged with patient by telephone for follow up visit in response to provider referral for pharmacy case management and/or care coordination services.   Consent to Services:  The patient was given information about Chronic Care Management services, agreed to services, and gave verbal consent prior to initiation of services.  Please see initial visit note for detailed documentation.   Patient Care Team: [name], MD as PCP - General (Family Medicine) Cammie Sickle, MD as Consulting Physician (Internal Medicine) [name], MD (Inactive) as Consulting Physician (Urology) Vladimir Crofts, MD as Consulting Physician (Neurology) Minna Merritts, MD as Consulting Physician (Cardiology) Noreene Filbert, MD as Referring Physician (Radiation Oncology) [name], MD (Inactive) as Referring Physician (Cardiothoracic Surgery) Debbora Dus, [facility] as Pharmacist (Pharmacist) Borders, Kirt Boys, NP as Nurse Practitioner [facility])  Recent office visits: 01/18/21 - PCP, video visit - COPD, severe. Recommend lasix  47m with potassium supplement QOD. Recommend slow taper of O2 with goal oxygen 90%. Avoid vasoconstrictive effect of overuse of oxygen. Follow up 3 months for AWV.  Recent consult visits: 12/22/20 - Oncology, video visit - Pt presented to discuss lung cancer and bladder cancer. Follow up imaging to be scheduled. No medication changes. 12/14/20 - Neurology - Pt presented for neuropathy and tremor. Discontinue gabapentin and primidone.  11/23/20 - Internal Medicine - Patient presented for wound care. No medication changes. 11/11/20 - Oncology, video visit - Pt presented for follow up, lung cancer and continued back pain. Continue Norco [id number] PRN #90. Continue Lidoderm 5% patches to back.  Hospital visits: None in previous 6 months  Objective:  Lab Results  Component Value Date   CREATININE 0.60 (L) 01/05/2021   BUN 23 07/12/2020   GFR 96.08 07/12/2020   GFRNONAA >60 05/01/2020   GFRAA >60 02/24/2020   NA 138 07/12/2020   K 4.3 07/12/2020   CALCIUM 9.4 07/12/2020   CO2 39 (H) 07/12/2020   GLUCOSE 114 (H) 07/12/2020    Lab Results  Component Value Date/Time   HGBA1C 6.6 (H) 07/12/2020 07:57 AM   HGBA1C 6.2 07/10/2019 08:10 AM   GFR 96.08 07/12/2020 07:57 AM   GFR 116.42 07/10/2019 08:10 AM    Last diabetic Eye exam:  Lab Results  Component Value Date/Time   HMDIABEYEEXA No Retinopathy 09/09/2017 12:00 AM    Last diabetic Foot exam: 01/2019 per chart documentation   Lab Results  Component Value Date   CHOL 168 07/12/2020   HDL 49.60 07/12/2020   LDLCALC 99 07/12/2020   LDLDIRECT 105 08/08/2010   TRIG 99.0 07/12/2020   CHOLHDL 3  07/12/2020    Hepatic Function Latest Ref Rng & Units 07/12/2020 04/16/2020 04/15/2020  Total Protein 6.0 - 8.3 g/dL 8.2 8.2(H) 9.1(H)  Albumin 3.5 - 5.2 g/dL 3.4(L) 3.9 4.4  AST 0 - 37 U/L 24 33 26  ALT 0 - 53 U/L '24 16 19  ' Alk Phosphatase 39 - 117 U/L 107 124 144(H)  Total Bilirubin 0.2 - 1.2 mg/dL 0.3 0.5 0.4    Lab Results  Component  Value Date/Time   TSH 0.65 12/31/2013 08:58 AM   TSH 0.78 07/26/2011 08:40 AM    CBC Latest Ref Rng & Units 05/01/2020 04/30/2020 04/29/2020  WBC 4.0 - 10.5 K/uL 7.7 9.2 8.6  Hemoglobin 13.0 - 17.0 g/dL 11.0(L) 11.1(L) 10.5(L)  Hematocrit 39.0 - 52.0 % 34.9(L) 36.1(L) 33.4(L)  Platelets 150 - 400 K/uL 201 197 168    Lab Results  Component Value Date/Time   VD25OH 27.71 (L) 07/12/2020 07:57 AM    Clinical ASCVD: Yes  The ASCVD Risk score (Arnett DK, et al., 2019) failed to calculate for the following reasons:   The 2019 ASCVD risk score is only valid for ages 77 to 33    Depression screen PHQ 2/9 07/10/2019 08/28/2018 08/07/2018  Decreased Interest 0 0 0  Down, Depressed, Hopeless 0 0 0  PHQ - 2 Score 0 0 0  Altered sleeping 0 - -  Tired, decreased energy 0 - -  Change in appetite 0 - -  Feeling bad or failure about yourself  0 - -  Trouble concentrating 0 - -  Moving slowly or fidgety/restless 0 - -  Suicidal thoughts 0 - -  PHQ-9 Score 0 - -  Difficult doing work/chores Not difficult at all - -  Some recent data might be hidden    Social History   Tobacco Use  Smoking Status Former   Packs/day: 1.50   Years: 61.00   Pack years: 91.50   Types: Cigarettes   Quit date: 03/08/2015   Years since quitting: 6.0  Smokeless Tobacco Never  Tobacco Comments   cut back 0.5 cigarettes--stopped 03/07/15; now now chantix   BP Readings from Last 3 Encounters:  01/18/21 (!) 143/68  11/29/20 (!) 142/62  09/30/20 (!) 131/45   Pulse Readings from Last 3 Encounters:  01/18/21 64  11/29/20 (!) 57  09/30/20 67   Wt Readings from Last 3 Encounters:  01/18/21 169 lb (76.7 kg)  11/29/20 172 lb (78 kg)  10/17/20 178 lb (80.7 kg)   BMI Readings from Last 3 Encounters:  01/18/21 25.70 kg/m  11/29/20 26.15 kg/m  10/17/20 27.06 kg/m    Assessment/Interventions: Review of patient past medical history, allergies, medications, health status, including review of consultants  reports, laboratory and other test data, was performed as part of comprehensive evaluation and provision of chronic care management services.   SDOH:  (Social Determinants of Health) assessments and interventions performed: Yes  SDOH Screenings   Alcohol Screen: Not on file  Depression (PHQ2-9): Not on file  Financial Resource Strain: Not on file  Food Insecurity: Not on file  Housing: Not on file  Physical Activity: Not on file  Social Connections: Not on file  Stress: Not on file  Tobacco Use: Medium Risk   Smoking Tobacco Use: Former   Smokeless Tobacco Use: Never   Passive Exposure: Not on file  Transportation Needs: Not on file    CCM Care Plan  Allergies  Allergen Reactions   Hm Lidocaine Patch [Lidocaine]     Loss  of muscle strength in legs    Medications Reviewed Today     Reviewed by Debbora Dus, [facility] (Pharmacist) on 03/14/21 at [place] List Status: <None>   Medication Order Taking? Sig Documenting Provider Last Dose Status Informant  ASPIRIN 81 PO [id number] Yes Take 1 tablet by mouth daily. [provider] Taking Active Self  azelastine (ASTELIN) 0.1 % nasal spray [id number] Yes Place 1 spray into both nostrils 2 (two) times daily. Use in each nostril as directed [name], MD Taking Active   furosemide (LASIX) 40 MG tablet [id number] Yes Take 1 tablet (40 mg total) by mouth every other day. [name], MD Taking Active   gabapentin (NEURONTIN) 100 MG capsule [id number] Yes Take 1 capsule (100 mg total) by mouth daily as needed. [name], MD Taking Active   HYDROcodone-acetaminophen [facility]) 7.5-325 MG tablet [id number] Yes Take 1 tablet by mouth every 4 (four) hours as needed for moderate pain. Borders, Kirt Boys, NP Taking Active   lisinopril (ZESTRIL) 10 MG tablet [id number] Yes TAKE 1 TABLET BY MOUTH EVERY Recardo Evangelist, MD Taking Active   metoprolol tartrate (LOPRESSOR) 25 MG tablet [id number] Yes TAKE 1/2 TABLET BY  MOUTH TWICE DAILY [name], MD Taking Active   Multiple Vitamin (MULTIVITAMIN WITH MINERALS) TABS tablet [id number] Yes Take 1 tablet by mouth every evening. [provider] Taking Active Self  naloxone [facility]) nasal spray 4 mg/0.1 mL [id number] Yes SPRAY 1 SPRAY INTO ONE NOSTRIL AS DIRECTED FOR OPIOID OVERDOSE (TURN PERSON ON SIDE AFTER DOSE. IF NO RESPONSE IN 2-3 MINUTES OR PERSON RESPONDS BUT RELAPSES, REPEAT USING A NEW SPRAY DEVICE AND SPRAY INTO THE OTHER NOSTRIL. CALL 911 AFTER USE.) * EMERGENCY USE ONLY * Borders, Kirt Boys, NP Taking Active   NON FORMULARY [id number] Yes Take 500 mg by mouth daily. Hemp Oil 500 mg [provider] Taking Active Self  omeprazole (PRILOSEC) 20 MG capsule [id number] Yes Take 20 mg by mouth every morning.  [provider] Taking Active Self  OXYGEN [id number] Yes Inhale 2 L into the lungs daily. Using 4 L [provider] Taking Active Self  oxymetazoline (AFRIN) 0.05 % nasal spray [id number] Yes Place 1 spray into both nostrils 2 (two) times daily. As needed [provider] Taking Active Self  polyethylene glycol (MIRALAX / GLYCOLAX) packet [id number] Yes Take 17 g by mouth daily. [provider] Taking Active Self  potassium chloride (KLOR-CON) 10 MEQ tablet [id number] Yes Take 1 tablet (10 mEq total) by mouth every other day. [name], MD Taking Active   PROAIR HFA 108 [phone] Base) MCG/ACT inhaler [id number] Yes INHALE 2 PUFFS INTO THE LUNGS EVERY 6 HOURS AS NEEDED FOR WHEEZING OR SHORTNESS OF Alvester Morin, MD Taking Active   vitamin B-12 (CYANOCOBALAMIN) 100 MCG tablet [id number] Yes Take 100 mcg by mouth every evening. [provider] Taking Active Self  vitamin E 400 UNIT capsule [id number] Yes Take 400 Units by mouth every evening.  [provider] Taking Active Self  Med List Note Chauncey Fischer, RN 07/14/18 1533): UDS 07/14/18            Patient Active Problem List    Diagnosis Date Noted   Diverticulum of [place] 01/19/2021   Atherosclerosis of aorta ([place]) 01/19/2021   Stasis ulcer ([place]) 09/30/2020   Vitamin D deficiency 07/18/2020   Recurrent pneumothorax after pleural drain removed 04/26/2020   Chronic respiratory failure with hypoxia ([place]) 04/26/2020   Bullous emphysema ([place]) 04/26/2020  Grade 1 Retrolisthesis of L2/L3 11/25/2019   Abnormal CT scan, lumbar spine (01/22/2019) 11/25/2019   Osteoarthritis involving multiple joints 08/18/2019   Rhinitis medicamentosa 07/14/2019   Right bundle branch block (RBBB) with left anterior hemiblock 01/21/2019   Other specified spondylopathies, sacral and sacrococcygeal region [facility]) 01/15/2019   Spondylosis without myelopathy or radiculopathy, lumbosacral region 08/28/2018   Abnormal MRI, lumbar spine (06/12/2018) 08/20/2018   DDD (degenerative disc disease), lumbosacral 08/07/2018   Lumbar facet hypertrophy (Multilevel) (Bilateral) 08/04/2018   Osteoarthritis of facet joint of lumbar spine 08/04/2018   Lumbar spondylosis 08/04/2018   Lumbar facet syndrome (Bilateral) (L>R) 08/04/2018   Lumbar lateral recess stenosis (L2-3, L3-4, L4-5) (Right) 08/04/2018   Lumbar foraminal stenosis (Right: L2-3, L3-4) (Bilateral: L4-5) 08/04/2018   Chronic low back pain (Primary area of Pain) (Bilateral)  (L>R) w/o sciatica 07/14/2018   Chronic coccyx pain (Secondary Area of Pain) (Midline) 07/14/2018   Chronic lower extremity pain (Tertiary Area of Pain) (Bilateral) (R>L) 07/14/2018   Chronic pain syndrome 07/14/2018   Long term current use of opiate analgesic 07/14/2018   Polypharmacy 07/14/2018   Disorder of skeletal system 07/14/2018   Problems influencing health status 07/14/2018   HLD (hyperlipidemia) 07/10/2018   DDD (degenerative disc disease), lumbar 07/10/2018   Macular degeneration of both eyes 10/01/2017   Overweight 07/30/2016   Mixed sensory-motor polyneuropathy 07/30/2016   Vitamin B12 deficiency  07/30/2016   Peripheral neuropathy due to chemotherapy ([place]) 12/23/2015   Malignant neoplasm of right upper lobe of lung ([place]) 12/20/2015   Pedal edema 12/09/2015   COPD, severe (HCC)    Primary cancer of bladder ([place])    PAD (peripheral artery disease) ([place]) 03/01/2015   Coronary artery disease involving native coronary artery of native heart without angina pectoris 03/01/2015   Health maintenance examination 01/05/2014   Advanced care planning/counseling discussion 01/05/2014   Controlled diabetes mellitus type 2 with complications ([place]) 62/83/6629   Benign prostatic hyperplasia 12/30/2012   Special screening for malignant neoplasms, colon 07/31/2011   Medicare annual wellness visit, subsequent 07/31/2011   Benign essential tremor    Personal history of colonic polyps - adenomas    HTN (hypertension)    Ex-smoker    Cardiac arrhythmia 03/11/2010    Immunization History  Administered Date(s) Administered   Influenza,inj,Quad PF,6+ Mos 07/03/2017, 07/08/2018   Moderna SARS-COV2 Booster Vaccination 05/26/2020   Moderna Sars-Covid-2 Vaccination 06/22/2019, 07/20/2019   Pneumococcal Conjugate-13 01/05/2014   Pneumococcal Polysaccharide-23 06/11/2010   Td 07/31/2011    Conditions to be addressed/monitored:  Hypertension, Hyperlipidemia, Diabetes, and Coronary Artery Disease  Care Plan : [place]  Updates made by Debbora Dus, [place] since 04/07/2021 12:00 AM     Problem: CHL AMB "PATIENT-SPECIFIC PROBLEM"      Long-Range Goal: Disease Management   Start Date: 03/14/2021  Priority: High  Note:   Current Barriers:  Due for A1c, diabetic eye and foot exam  Clinic BP mildly elevated  Cholesterol elevated, unable to tolerate statin  Pharmacist Clinical Goal(s):  Patient will contact provider office for questions/concerns as evidenced notation of same in electronic health record through collaboration with PharmD and provider.   Interventions: 1:1 collaboration  with [name], MD regarding development and update of comprehensive plan of care as evidenced by provider attestation and co-signature Inter-disciplinary care team collaboration (see longitudinal plan of care) Comprehensive medication review performed; medication list updated in electronic medical record  Hypertension (BP goal <140/90) -Controlled- per clinic readings  -Affirms adherence daily as  prescribed -Current treatment: Lisinopril 10 mg - 1 tablet daily Metoprolol tartrate 25 mg - 1/2 tablet twice daily Potassium chloride 10 mEq - 1 tablet every other day (with Lasix) Lasix 40 mg - 1 tablet every other day (for skin weeping) -Medications previously tried: none  -Current home readings: does not check BP at home, SBP always less than 150 in office per patient -Denies hypotensive/hypertensive symptoms -Recommended to continue current medication; Will keep an eye on clinic BP since its borderline elevated. Recommend purchasing a home BP monitor.  Hyperlipidemia/CAD/PAD (LDL goal < 70) -Uncontrolled - LDL 99 -Current treatment: Aspirin 81 mg - 1 tablet daily -Medications previously tried: Crestor 10 mg - pt stated it caused ankle swelling (re-trial completed 04/2020- 07/2020) -Current exercise habits: minimal due to pain -Educated on Cholesterol goals; Importance of statin in CAD -Recommended weight bearing exercises such as chair exercises; Consider statin re-trial or Zetia.  Patient Goals/Self-Care Activities Patient will:  - take medications as prescribed  Follow Up Plan: Telephone follow up appointment with care management team member scheduled for:  12 months; CMA BP review in 3 months     Medication Assistance: None required.  Patient affirms current coverage meets needs.  Compliance/Adherence/Medication fill history: Care Gaps: A1c - due Foot exam - due Eye exam - due   Star-Rating Drugs: Medication:                Last Fill:         Day Supply Lisinopril  76m            12/30/20            90  Patient's preferred pharmacy is:  [facility] [address]Phone: [phone]: [phone] Uses pill box? No - keeps them in a convenient location Pt endorses 100% compliance  Care Plan and Follow Up Patient Decision:  Patient agrees to Care Plan and Follow-up.  MDebbora Dus PharmD Clinical Pharmacist [phone]

## 2021-03-23 ENCOUNTER — Encounter: Payer: Self-pay | Admitting: Hospice and Palliative Medicine

## 2021-03-23 ENCOUNTER — Telehealth: Payer: Self-pay | Admitting: Family Medicine

## 2021-03-23 NOTE — Telephone Encounter (Signed)
Ok to change visit?

## 2021-03-23 NOTE — Telephone Encounter (Signed)
Pt called in requesting to change his 11/14 visit to virtual. Pt has oxygen and it makes him uncomfortable, pt states that he cant stand for long with walker before needing a wheelchair

## 2021-03-23 NOTE — Telephone Encounter (Signed)
Ok to do virtual

## 2021-03-24 ENCOUNTER — Inpatient Hospital Stay: Payer: Medicare Other | Attending: Hospice and Palliative Medicine | Admitting: Hospice and Palliative Medicine

## 2021-03-24 DIAGNOSIS — G893 Neoplasm related pain (acute) (chronic): Secondary | ICD-10-CM

## 2021-03-24 DIAGNOSIS — Z515 Encounter for palliative care: Secondary | ICD-10-CM | POA: Diagnosis not present

## 2021-03-24 DIAGNOSIS — C3411 Malignant neoplasm of upper lobe, right bronchus or lung: Secondary | ICD-10-CM | POA: Diagnosis not present

## 2021-03-24 NOTE — Telephone Encounter (Signed)
Noted.  Changed visit to [place] visit.  Spoke with pt notifying him Dr. Darnell Level agreed to change appt to virtual and that he will see this change in his MyChart.  Pt verbalizes understanding and expresses his thanks.

## 2021-03-24 NOTE — Progress Notes (Signed)
Virtual Visit via Telephone Note  I connected with Joseph Graham on 03/24/21 at  1:00 PM EDT by telephone and verified that I am speaking with the correct person using two identifiers.  Location: Patient: home Provider: clinic   I discussed the limitations, risks, security and privacy concerns of performing an evaluation and management service by telephone and the availability of in person appointments. I also discussed with the patient that there may be a patient responsible charge related to this service. The patient expressed understanding and agreed to proceed.   History of Present Illness: [name] is a 80 year old man with multiple medical problems including O2 dependent COPD, CAD, PAD, peripheral neuropathy, and lumbar stenosis with chronic back pain.  Additionally, patient has stage II lung cancer with CT of the chest on 02/24/2020 revealing interval growth and pulmonary nodules.    Patient is status post XRT.  He has had severe chronic back pain limiting treatment and imaging.  He was referred to palliative care to address goals and manage ongoing symptoms.   Observations/Objective:  Patient reports he is doing well.  He denies any significant changes or concerns.  Pain is reportedly stable on current regimen with use of as needed Norco, which patient takes several times a day.  I recently increased the frequency of Norco to allow some flexibility should patient need to take it more often as he occasionally will have some severe days.  Assessment and Plan: Lung cancer -s/p XRT.  Now on surveillance.  .  Chronic low back pain -continue Norco 7.5-325mg  Q4H PRN   Follow Up Instructions: Follow-up MyChart visit with me in 2 months   I discussed the assessment and treatment plan with the patient. The patient was provided an opportunity to ask questions and all were answered. The patient agreed with the plan and demonstrated an understanding of the instructions.   The  patient was advised to call back or seek an in-person evaluation if the symptoms worsen or if the condition fails to improve as anticipated.  I provided 15 minutes of non-face-to-face time during this encounter.   Irean Hong, NP

## 2021-04-02 DIAGNOSIS — J95811 Postprocedural pneumothorax: Secondary | ICD-10-CM | POA: Diagnosis not present

## 2021-04-02 DIAGNOSIS — J9621 Acute and chronic respiratory failure with hypoxia: Secondary | ICD-10-CM | POA: Diagnosis not present

## 2021-04-02 DIAGNOSIS — J9611 Chronic respiratory failure with hypoxia: Secondary | ICD-10-CM | POA: Diagnosis not present

## 2021-04-02 DIAGNOSIS — R0600 Dyspnea, unspecified: Secondary | ICD-10-CM | POA: Diagnosis not present

## 2021-04-02 DIAGNOSIS — R0602 Shortness of breath: Secondary | ICD-10-CM | POA: Diagnosis not present

## 2021-04-03 ENCOUNTER — Other Ambulatory Visit: Payer: Self-pay | Admitting: Hospice and Palliative Medicine

## 2021-04-03 MED ORDER — HYDROCODONE-ACETAMINOPHEN 7.5-325 MG PO TABS: 1.0000 | ORAL_TABLET | ORAL | 0 refills | Status: DC | PRN

## 2021-04-06 ENCOUNTER — Other Ambulatory Visit: Payer: Self-pay | Admitting: Family Medicine

## 2021-04-07 NOTE — Patient Instructions (Signed)
Dear [name],  Below is a summary of the goals we discussed during our follow up appointment on March 14, 2021. Please contact me anytime with questions or concerns.   Visit Information  Patient Care Plan: CCM Pharmacy Care Plan     Problem Identified: CHL AMB "PATIENT-SPECIFIC PROBLEM"      Long-Range Goal: Disease Management   Start Date: 03/14/2021  Priority: High  Note:   Current Barriers:  Due for A1c, diabetic eye and foot exam  Clinic BP mildly elevated  Cholesterol elevated, unable to tolerate statin  Pharmacist Clinical Goal(s):  Patient will contact provider office for questions/concerns as evidenced notation of same in electronic health record through collaboration with PharmD and provider.   Interventions: 1:1 collaboration with [name], MD regarding development and update of comprehensive plan of care as evidenced by provider attestation and co-signature Inter-disciplinary care team collaboration (see longitudinal plan of care) Comprehensive medication review performed; medication list updated in electronic medical record  Hypertension (BP goal <140/90) -Controlled- per clinic readings  -Affirms adherence daily as prescribed -Current treatment: Lisinopril 10 mg - 1 tablet daily Metoprolol tartrate 25 mg - 1/2 tablet twice daily Potassium chloride 10 mEq - 1 tablet every other day (with Lasix) Lasix 40 mg - 1 tablet every other day (for skin weeping) -Medications previously tried: none  -Current home readings: does not check BP at home, SBP always less than 150 in office per patient -Denies hypotensive/hypertensive symptoms -Recommended to continue current medication; Will keep an eye on clinic BP since its borderline elevated. Recommend purchasing a home BP monitor.  Hyperlipidemia/CAD/PAD (LDL goal < 70) -Uncontrolled - LDL 99 -Current treatment: Aspirin 81 mg - 1 tablet daily -Medications previously tried: Crestor 10 mg - pt stated it  caused ankle swelling (re-trial completed 04/2020- 07/2020) -Current exercise habits: minimal due to pain -Educated on Cholesterol goals; Importance of statin in CAD -Recommended weight bearing exercises such as chair exercises; Consider statin re-trial or Zetia.  Patient Goals/Self-Care Activities Patient will:  - take medications as prescribed  Follow Up Plan: Telephone follow up appointment with care management team member scheduled for:  12 months; CMA BP review in 3 months      Patient verbalizes understanding of instructions provided today and agrees to view in [place].   Debbora Dus, PharmD Clinical Pharmacist [place] [facility] [phone]

## 2021-04-10 ENCOUNTER — Encounter: Payer: Self-pay | Admitting: Hospice and Palliative Medicine

## 2021-04-10 DIAGNOSIS — E118 Type 2 diabetes mellitus with unspecified complications: Secondary | ICD-10-CM | POA: Diagnosis not present

## 2021-04-10 DIAGNOSIS — E782 Mixed hyperlipidemia: Secondary | ICD-10-CM

## 2021-04-10 DIAGNOSIS — I10 Essential (primary) hypertension: Secondary | ICD-10-CM | POA: Diagnosis not present

## 2021-04-23 ENCOUNTER — Encounter: Payer: Self-pay | Admitting: Hospice and Palliative Medicine

## 2021-04-24 ENCOUNTER — Encounter: Payer: Self-pay | Admitting: Family Medicine

## 2021-04-24 ENCOUNTER — Other Ambulatory Visit: Payer: Self-pay | Admitting: Hospice and Palliative Medicine

## 2021-04-24 ENCOUNTER — Other Ambulatory Visit: Payer: Self-pay

## 2021-04-24 ENCOUNTER — Telehealth (INDEPENDENT_AMBULATORY_CARE_PROVIDER_SITE_OTHER): Payer: Medicare Other | Admitting: Family Medicine

## 2021-04-24 ENCOUNTER — Telehealth: Payer: Self-pay

## 2021-04-24 VITALS — BP 152/63 | HR 62 | Ht 68.0 in | Wt 160.1 lb

## 2021-04-24 DIAGNOSIS — J439 Emphysema, unspecified: Secondary | ICD-10-CM | POA: Diagnosis not present

## 2021-04-24 DIAGNOSIS — J9611 Chronic respiratory failure with hypoxia: Secondary | ICD-10-CM

## 2021-04-24 DIAGNOSIS — J449 Chronic obstructive pulmonary disease, unspecified: Secondary | ICD-10-CM | POA: Diagnosis not present

## 2021-04-24 DIAGNOSIS — R634 Abnormal weight loss: Secondary | ICD-10-CM | POA: Diagnosis not present

## 2021-04-24 MED ORDER — ALBUTEROL SULFATE HFA 108 (90 BASE) MCG/ACT IN AERS: 2.0000 | INHALATION_SPRAY | Freq: Four times a day (QID) | RESPIRATORY_TRACT | 3 refills | Status: AC | PRN

## 2021-04-24 MED ORDER — TRELEGY ELLIPTA 100-62.5-25 MCG/ACT IN AEPB: 1.0000 | INHALATION_SPRAY | Freq: Every day | RESPIRATORY_TRACT | 3 refills | Status: DC

## 2021-04-24 MED ORDER — HYDROCODONE-ACETAMINOPHEN 7.5-325 MG PO TABS: 1.0000 | ORAL_TABLET | ORAL | 0 refills | Status: DC | PRN

## 2021-04-24 NOTE — Telephone Encounter (Signed)
Pt has MyChart video visit today at 3:00 with Dr. Darnell Level.  Need to get pt ready for visit.   Lvm asking pt to call back.

## 2021-04-24 NOTE — Assessment & Plan Note (Signed)
He notes good appetite. Encouraged try protein supplements.  Anticipate reflection of progression of lung diease.  Continue to monitor.

## 2021-04-24 NOTE — Telephone Encounter (Signed)
Spoke with pt getting him ready for visit.

## 2021-04-24 NOTE — Patient Instructions (Addendum)
Schedule physical in 3-4 months

## 2021-04-24 NOTE — Progress Notes (Signed)
Patient ID: Joseph Graham, male    DOB: 06/29/1940, 80 y.o.   MRN: [id number]  Virtual visit completed through [employer], a video enabled telemedicine application. Due to national recommendations of social distancing due to COVID-19, a virtual visit is felt to be most appropriate for this patient at this time. Reviewed limitations, risks, security and privacy concerns of performing a virtual visit and the availability of in person appointments. I also reviewed that there may be a patient responsible charge related to this service. The patient agreed to proceed.   Patient location: home Provider location: [place] at [facility], office Persons participating in this virtual visit: patient, provider   If any vitals were documented, they were collected by patient at home unless specified below.    BP (!) 152/63   Pulse 62   Ht 5\' 8"  (1.727 m)   Wt 160 lb 2 oz (72.6 kg)   SpO2 99% Comment: 5L [place]  BMI 24.35 kg/m    CC: COPD Subjective:   HPI: Joseph Graham is a 80 y.o. male presenting on 04/24/2021 for Follow-up (6 mo f/u.  Wants to discuss ProAir inhaler. )   Another 10 lbs down. He feels his weight has plateaued. He has decreased lasix use. Eating several smaller meals throughout the day. Not using protein supplements.   Known COPD with lung cancer with radiation changes to the lungs. Continues seldom albuterol use, using up to 6L supplemental O2 by [place]. Has previously declined respiratory medications. Spiriva trial was previously not helpful.   Rough 2 weeks recently due to dyspnea.  Has tapered oxygen dose to 5L [place] from 6L.   No fevers. Intermittent productive cough of white phlegm.  2 falls these past few months.  Looking to get stair seat.   Feels proair is helpful. Requests generic albuterol sent to pharmacy.   He's started using compression stockings.      Relevant past medical, surgical, family and social history reviewed and updated as indicated. Interim medical  history since our last visit reviewed. Allergies and medications reviewed and updated. Outpatient Medications Prior to Visit  Medication Sig Dispense Refill   ASPIRIN 81 PO Take 1 tablet by mouth daily.     azelastine (ASTELIN) 0.1 % nasal spray Place 1 spray into both nostrils 2 (two) times daily. Use in each nostril as directed 30 mL 12   furosemide (LASIX) 40 MG tablet Take 1 tablet (40 mg total) by mouth every other day. 45 tablet 1   gabapentin (NEURONTIN) 100 MG capsule Take 1 capsule (100 mg total) by mouth daily as needed.     HYDROcodone-acetaminophen (NORCO) 7.5-325 MG tablet Take 1 tablet by mouth every 4 (four) hours as needed for moderate pain. 90 tablet 0   lisinopril (ZESTRIL) 10 MG tablet TAKE 1 TABLET BY MOUTH EVERY EVENING 90 tablet 0   metoprolol tartrate (LOPRESSOR) 25 MG tablet TAKE 1/2 TABLET BY MOUTH TWICE DAILY 90 tablet 1   Multiple Vitamin (MULTIVITAMIN WITH MINERALS) TABS tablet Take 1 tablet by mouth every evening.     naloxone (NARCAN) nasal spray 4 mg/0.1 mL SPRAY 1 SPRAY INTO ONE NOSTRIL AS DIRECTED FOR OPIOID OVERDOSE (TURN PERSON ON SIDE AFTER DOSE. IF NO RESPONSE IN 2-3 MINUTES OR PERSON RESPONDS BUT RELAPSES, REPEAT USING A NEW SPRAY DEVICE AND SPRAY INTO THE OTHER NOSTRIL. CALL 911 AFTER USE.) * EMERGENCY USE ONLY * 1 each 0   NON FORMULARY Take 500 mg by mouth daily. Hemp Oil 500  mg     omeprazole (PRILOSEC) 20 MG capsule Take 20 mg by mouth every morning.      OXYGEN Inhale 2 L into the lungs daily. Using 4 L     oxymetazoline (AFRIN) 0.05 % nasal spray Place 1 spray into both nostrils 2 (two) times daily. As needed     polyethylene glycol (MIRALAX / GLYCOLAX) packet Take 17 g by mouth daily.     potassium chloride (KLOR-CON) 10 MEQ tablet Take 1 tablet (10 mEq total) by mouth every other day. 45 tablet 1   vitamin B-12 (CYANOCOBALAMIN) 100 MCG tablet Take 100 mcg by mouth every evening.     vitamin E 400 UNIT capsule Take 400 Units by mouth every evening.       PROAIR HFA 108 (90 Base) MCG/ACT inhaler INHALE 2 PUFFS INTO THE LUNGS EVERY 6 HOURS AS NEEDED FOR WHEEZING OR SHORTNESS OF BREATH 17 g 1   HYDROcodone-acetaminophen (NORCO) 7.5-325 MG tablet Take 1 tablet by mouth every 4 (four) hours as needed for moderate pain. 90 tablet 0   No facility-administered medications prior to visit.     Per HPI unless specifically indicated in ROS section below Review of Systems Objective:  BP (!) 152/63   Pulse 62   Ht 5\' 8"  (1.727 m)   Wt 160 lb 2 oz (72.6 kg)   SpO2 99% Comment: 5L [place]  BMI 24.35 kg/m   Wt Readings from Last 3 Encounters:  04/24/21 160 lb 2 oz (72.6 kg)  01/18/21 169 lb (76.7 kg)  11/29/20 172 lb (78 kg)       Physical exam: Gen: alert, NAD, not ill appearing. Supplemental O2 in place via [place]m: speaks in complete sentences without increased work of breathing Psych: normal mood, normal thought content      Results for orders placed or performed during the hospital encounter of 01/05/21  I-STAT creatinine  Result Value Ref Range   Creatinine, Ser 0.60 (L) 0.61 - 1.24 mg/dL   Assessment & Plan:   Problem List Items Addressed This Visit     COPD, severe ([place]) - Primary    O2 dependent. Agrees to try daily controller medication - will price out trelegy. If unaffordable, consider breo once daily.  He continues albuterol - I have sent in generic per pt request. He is established with palliative care team through oncology - consider establishing with outpatient team for support at home - he declines for now.       Relevant Medications   albuterol (VENTOLIN HFA) 108 (90 Base) MCG/ACT inhaler   Fluticasone-Umeclidin-Vilant (TRELEGY ELLIPTA) 100-62.5-25 MCG/ACT AEPB   Unintentional weight loss    He notes good appetite. Encouraged try protein supplements.  Anticipate reflection of progression of lung diease.  Continue to monitor.       Chronic respiratory failure with hypoxia (HCC)   Bullous emphysema (HCC)    Relevant Medications   albuterol (VENTOLIN HFA) 108 (90 Base) MCG/ACT inhaler   Fluticasone-Umeclidin-Vilant (TRELEGY ELLIPTA) 100-62.5-25 MCG/ACT AEPB     Meds ordered this encounter  Medications   albuterol (VENTOLIN HFA) 108 (90 Base) MCG/ACT inhaler    Sig: Inhale 2 puffs into the lungs every 6 (six) hours as needed for wheezing or shortness of breath.    Dispense:  18 g    Refill:  3    To replace proair   Fluticasone-Umeclidin-Vilant (TRELEGY ELLIPTA) 100-62.5-25 MCG/ACT AEPB    Sig: Inhale 1 puff into the lungs daily.  Dispense:  1 each    Refill:  3    No orders of the defined types were placed in this encounter.   I discussed the assessment and treatment plan with the patient. The patient was provided an opportunity to ask questions and all were answered. The patient agreed with the plan and demonstrated an understanding of the instructions. The patient was advised to call back or seek an in-person evaluation if the symptoms worsen or if the condition fails to improve as anticipated.  Follow up plan: Return in about 3 months (around 07/25/2021) for medicare wellness visit, annual exam, prior fasting for blood work.  [name], MD

## 2021-04-24 NOTE — Assessment & Plan Note (Addendum)
O2 dependent. Agrees to try daily controller medication - will price out trelegy. If unaffordable, consider breo once daily.  He continues albuterol - I have sent in generic per pt request. He is established with palliative care team through oncology - consider establishing with outpatient team for support at home - he declines for now.

## 2021-04-24 NOTE — Progress Notes (Signed)
Patient requested refill of Norcor. PDMP reviewed. Rx sent to pharmacy.

## 2021-04-25 ENCOUNTER — Ambulatory Visit: Payer: Medicare Other | Admitting: Family Medicine

## 2021-04-26 ENCOUNTER — Encounter: Payer: Self-pay | Admitting: Family Medicine

## 2021-04-26 MED ORDER — TRELEGY ELLIPTA 100-62.5-25 MCG/ACT IN AEPB: 1.0000 | INHALATION_SPRAY | Freq: Every day | RESPIRATORY_TRACT | 3 refills | Status: DC

## 2021-04-26 NOTE — Telephone Encounter (Signed)
Spoke with [facility] [address] asking if they received Trelegy rx.  States they did not.  I resent rx to pharmacy.

## 2021-04-29 ENCOUNTER — Encounter: Payer: Self-pay | Admitting: Family Medicine

## 2021-05-03 DIAGNOSIS — R0600 Dyspnea, unspecified: Secondary | ICD-10-CM | POA: Diagnosis not present

## 2021-05-03 DIAGNOSIS — J9621 Acute and chronic respiratory failure with hypoxia: Secondary | ICD-10-CM | POA: Diagnosis not present

## 2021-05-03 DIAGNOSIS — R0602 Shortness of breath: Secondary | ICD-10-CM | POA: Diagnosis not present

## 2021-05-03 DIAGNOSIS — J9611 Chronic respiratory failure with hypoxia: Secondary | ICD-10-CM | POA: Diagnosis not present

## 2021-05-03 DIAGNOSIS — J95811 Postprocedural pneumothorax: Secondary | ICD-10-CM | POA: Diagnosis not present

## 2021-05-15 ENCOUNTER — Other Ambulatory Visit: Payer: Self-pay | Admitting: Hospice and Palliative Medicine

## 2021-05-16 MED ORDER — HYDROCODONE-ACETAMINOPHEN 7.5-325 MG PO TABS: 1.0000 | ORAL_TABLET | ORAL | 0 refills | Status: DC | PRN

## 2021-05-26 ENCOUNTER — Inpatient Hospital Stay (HOSPITAL_BASED_OUTPATIENT_CLINIC_OR_DEPARTMENT_OTHER): Payer: Medicare Other | Admitting: Hospice and Palliative Medicine

## 2021-05-26 DIAGNOSIS — C3411 Malignant neoplasm of upper lobe, right bronchus or lung: Secondary | ICD-10-CM

## 2021-05-26 DIAGNOSIS — G894 Chronic pain syndrome: Secondary | ICD-10-CM

## 2021-05-26 DIAGNOSIS — Z515 Encounter for palliative care: Secondary | ICD-10-CM

## 2021-05-26 NOTE — Progress Notes (Signed)
Virtual Visit via Telephone Note  I connected with Joseph Graham on 05/26/21 at 11:30 AM EST by telephone and verified that I am speaking with the correct person using two identifiers.  Location: Patient: home Provider: clinic   I discussed the limitations, risks, security and privacy concerns of performing an evaluation and management service by telephone and the availability of in person appointments. I also discussed with the patient that there may be a patient responsible charge related to this service. The patient expressed understanding and agreed to proceed.   History of Present Illness: [name] is a 80 year old man with multiple medical problems including O2 dependent COPD, CAD, PAD, peripheral neuropathy, and lumbar stenosis with chronic back pain.  Additionally, patient has stage II lung cancer with CT of the chest on 02/24/2020 revealing interval growth and pulmonary nodules.    Patient is status post XRT.  He has had severe chronic back pain limiting treatment and imaging.  He was referred to palliative care to address goals and manage ongoing symptoms.   Observations/Objective:  Patient says he is having difficulty with chronic tremors.  He says that these are being caused by the gabapentin prescribed by his PCP.  Patient has been referred to neurology but patient was unclear if they had anything else to offer.   Otherwise, patient says that his breathing is improved with new inhaler (?  Trelegy) and that his pain is stable on current Norco with use of occasional lidocaine patches.  No other significant changes or concerns reported.  Assessment and Plan: Lung cancer -s/p XRT.  Now on surveillance.  .  Chronic low back pain -continue Norco 7.5-325mg  Q4H PRN and OTC lidocaine patches as needed   Follow Up Instructions: Follow-up MyChart visit with me in 2 months   I discussed the assessment and treatment plan with the patient. The patient was provided an  opportunity to ask questions and all were answered. The patient agreed with the plan and demonstrated an understanding of the instructions.   The patient was advised to call back or seek an in-person evaluation if the symptoms worsen or if the condition fails to improve as anticipated.  I provided 15 minutes of non-face-to-face time during this encounter.   Irean Hong, NP

## 2021-06-05 ENCOUNTER — Other Ambulatory Visit: Payer: Self-pay | Admitting: Hospice and Palliative Medicine

## 2021-06-07 MED ORDER — HYDROCODONE-ACETAMINOPHEN 7.5-325 MG PO TABS: 1.0000 | ORAL_TABLET | ORAL | 0 refills | Status: DC | PRN

## 2021-06-13 ENCOUNTER — Encounter: Payer: Self-pay | Admitting: Family Medicine

## 2021-06-14 ENCOUNTER — Encounter: Payer: Self-pay | Admitting: Family Medicine

## 2021-06-14 ENCOUNTER — Other Ambulatory Visit: Payer: Self-pay

## 2021-06-14 ENCOUNTER — Ambulatory Visit (INDEPENDENT_AMBULATORY_CARE_PROVIDER_SITE_OTHER): Payer: Medicare Other | Admitting: Family Medicine

## 2021-06-14 VITALS — BP 162/58 | HR 87 | Temp 98.7°F | Ht 68.0 in | Wt 156.0 lb

## 2021-06-14 DIAGNOSIS — I10 Essential (primary) hypertension: Secondary | ICD-10-CM | POA: Diagnosis not present

## 2021-06-14 DIAGNOSIS — J449 Chronic obstructive pulmonary disease, unspecified: Secondary | ICD-10-CM | POA: Diagnosis not present

## 2021-06-14 DIAGNOSIS — Q2549 Other congenital malformations of aorta: Secondary | ICD-10-CM

## 2021-06-14 DIAGNOSIS — J029 Acute pharyngitis, unspecified: Secondary | ICD-10-CM | POA: Insufficient documentation

## 2021-06-14 DIAGNOSIS — C3411 Malignant neoplasm of upper lobe, right bronchus or lung: Secondary | ICD-10-CM

## 2021-06-14 DIAGNOSIS — C679 Malignant neoplasm of bladder, unspecified: Secondary | ICD-10-CM

## 2021-06-14 LAB — POCT RAPID STREP A (OFFICE): Rapid Strep A Screen: NEGATIVE

## 2021-06-14 NOTE — Progress Notes (Signed)
Patient ID: Joseph Graham, male    DOB: [birth year], 81 y.o.   MRN: [id number]  This visit was conducted in person.  BP (!) 162/58 (BP Location: Left Arm, Patient Position: Sitting, Cuff Size: Normal)    Pulse 87    Temp 98.7 F (37.1 C) (Temporal)    Ht 5\' 8"  (1.727 m)    Wt 156 lb (70.8 kg)    SpO2 92%    BMI 23.72 kg/m   BP Readings from Last 3 Encounters:  06/14/21 (!) 162/58  04/24/21 (!) 152/63  01/18/21 (!) 143/68   Home BP runs 094B systolic.  On repeat in office 170s/55  CC: check throat Subjective:   HPI: Joseph Graham is a 81 y.o. male presenting on 06/14/2021 for Sore Throat (X 1 week. Patient thinks this is from using a nasal wash he got for christmas.)   7d h/o R sided throat pain with swallowing - this seems to be getting better.  They had recently started using nasal lavage system, wonder if this caused it. Initial hoarse voice is getting better.  No other symptoms - cough, fevers, congestion, chest pain, headache, swollen glands. Has been using throat lozenges. Known lung cancer s/p XRT on surveillance.  Progressive weight loss noted - attributes to poor eating recently.  Has been breathing better since starting trelegy.  Known diverticulum of [place] - would need to monitor for dysphagia lusoria or cough from tracheal irritation.   Recent exposure to COVID. He took home test yesterday which came back negative.   Has not tolerated gabapentin due to slowed speech/mentation - now off this. Didn't do well with primidone either.      Relevant past medical, surgical, family and social history reviewed and updated as indicated. Interim medical history since our last visit reviewed. Allergies and medications reviewed and updated. Outpatient Medications Prior to Visit  Medication Sig Dispense Refill   albuterol (VENTOLIN HFA) 108 (90 Base) MCG/ACT inhaler Inhale 2 puffs into the lungs every 6 (six) hours as needed for wheezing or shortness of breath. 18 g 3    ASPIRIN 81 PO Take 1 tablet by mouth daily.     azelastine (ASTELIN) 0.1 % nasal spray Place 1 spray into both nostrils 2 (two) times daily. Use in each nostril as directed 30 mL 12   Fluticasone-Umeclidin-Vilant (TRELEGY ELLIPTA) 100-62.5-25 MCG/ACT AEPB Inhale 1 puff into the lungs daily. 1 each 3   furosemide (LASIX) 40 MG tablet Take 1 tablet (40 mg total) by mouth every other day. 45 tablet 1   gabapentin (NEURONTIN) 100 MG capsule Take 1 capsule (100 mg total) by mouth daily as needed.     HYDROcodone-acetaminophen (NORCO) 7.5-325 MG tablet Take 1 tablet by mouth every 4 (four) hours as needed for moderate pain. 90 tablet 0   lisinopril (ZESTRIL) 10 MG tablet TAKE 1 TABLET BY MOUTH EVERY EVENING 90 tablet 0   metoprolol tartrate (LOPRESSOR) 25 MG tablet TAKE 1/2 TABLET BY MOUTH TWICE DAILY 90 tablet 1   Multiple Vitamin (MULTIVITAMIN WITH MINERALS) TABS tablet Take 1 tablet by mouth every evening.     naloxone (NARCAN) nasal spray 4 mg/0.1 mL SPRAY 1 SPRAY INTO ONE NOSTRIL AS DIRECTED FOR OPIOID OVERDOSE (TURN PERSON ON SIDE AFTER DOSE. IF NO RESPONSE IN 2-3 MINUTES OR PERSON RESPONDS BUT RELAPSES, REPEAT USING A NEW SPRAY DEVICE AND SPRAY INTO THE OTHER NOSTRIL. CALL 911 AFTER USE.) * EMERGENCY USE ONLY * 1 each 0   NON  FORMULARY Take 500 mg by mouth daily. Hemp Oil 500 mg     omeprazole (PRILOSEC) 20 MG capsule Take 20 mg by mouth every morning.      OXYGEN Inhale 2 L into the lungs daily. Using 4 L     oxymetazoline (AFRIN) 0.05 % nasal spray Place 1 spray into both nostrils 2 (two) times daily. As needed     polyethylene glycol (MIRALAX / GLYCOLAX) packet Take 17 g by mouth daily.     potassium chloride (KLOR-CON) 10 MEQ tablet Take 1 tablet (10 mEq total) by mouth every other day. 45 tablet 1   vitamin B-12 (CYANOCOBALAMIN) 100 MCG tablet Take 100 mcg by mouth every evening.     vitamin E 400 UNIT capsule Take 400 Units by mouth every evening.      No facility-administered medications  prior to visit.     Per HPI unless specifically indicated in ROS section below Review of Systems  Objective:  BP (!) 162/58 (BP Location: Left Arm, Patient Position: Sitting, Cuff Size: Normal)    Pulse 87    Temp 98.7 F (37.1 C) (Temporal)    Ht 5\' 8"  (1.727 m)    Wt 156 lb (70.8 kg)    SpO2 92%    BMI 23.72 kg/m   Wt Readings from Last 3 Encounters:  06/14/21 156 lb (70.8 kg)  04/24/21 160 lb 2 oz (72.6 kg)  01/18/21 169 lb (76.7 kg)      Physical Exam Vitals and nursing note reviewed.  Constitutional:      Appearance: Normal appearance. He is not ill-appearing.     Comments: Sitting in wheelchair with high flow supplemental oxygen  HENT:     Head: Normocephalic and atraumatic.     Right Ear: Hearing, tympanic membrane, ear canal and external ear normal. There is no impacted cerumen.     Left Ear: Hearing, tympanic membrane, ear canal and external ear normal. There is no impacted cerumen.     Nose: Nose normal. No mucosal edema or rhinorrhea.     Right Turbinates: Not enlarged or swollen.     Left Turbinates: Not enlarged or swollen.     Right Sinus: No maxillary sinus tenderness or frontal sinus tenderness.     Left Sinus: No maxillary sinus tenderness or frontal sinus tenderness.     Mouth/Throat:     Mouth: Mucous membranes are moist.     Tongue: No lesions.     Palate: Lesions present. No mass.     Pharynx: Oropharynx is clear. Posterior oropharyngeal erythema present. No oropharyngeal exudate.     Comments: Multiple white papules/erosions to right soft palate  Eyes:     Extraocular Movements: Extraocular movements intact.     Conjunctiva/sclera: Conjunctivae normal.     Pupils: Pupils are equal, round, and reactive to light.  Cardiovascular:     Rate and Rhythm: Normal rate and regular rhythm.     Pulses: Normal pulses.     Heart sounds: Normal heart sounds. No murmur heard. Pulmonary:     Effort: Pulmonary effort is normal. No respiratory distress.     Breath  sounds: Normal breath sounds. No wheezing, rhonchi or rales.  Musculoskeletal:     Cervical back: Normal range of motion and neck supple. No rigidity.     Right lower leg: No edema.     Left lower leg: No edema.  Lymphadenopathy:     Cervical: No cervical adenopathy.  Skin:    General: Skin is  warm and dry.     Findings: No rash.  Neurological:     Mental Status: He is alert.  Psychiatric:        Mood and Affect: Mood normal.        Behavior: Behavior normal.      Results for orders placed or performed in visit on 06/14/21  POCT rapid strep A  Result Value Ref Range   Rapid Strep A Screen Negative Negative    Assessment & Plan:  This visit occurred during the SARS-CoV-2 public health emergency.  Safety protocols were in place, including screening questions prior to the visit, additional usage of staff PPE, and extensive cleaning of exam room while observing appropriate contact time as indicated for disinfecting solutions.   Problem List Items Addressed This Visit     HTN (hypertension)    BP remains elevated - however in setting of not taking BP meds over the past 5 days due to throat pain. He will restart taking and start monitoring BP more closely at home, letting me know if persistently elevated to likely increase BB dosing.       Primary cancer of bladder ([place])    Overdue for bladder cancer surveillance - declines at this time.       COPD, severe ([place])    O2 dependent. Respiratory status doing better since starting trelegy - continue this.       Malignant neoplasm of right upper lobe of lung [facility])    S/p radiation therapy, now undergoing surveillance.       Diverticulum of [place]   Acute pharyngitis - Primary    Anticipate viral pharyngitis. Check RST today - negative Supportive care as per instructions.  Update if not improving over time. Pt agrees with plan.       Relevant Orders   POCT rapid strep A (Completed)     No orders of the defined types were  placed in this encounter.  Orders Placed This Encounter  Procedures   POCT rapid strep A     Patient Instructions  You have acute viral pharyngitis. Strep test returned negative  Push fluids and plenty of rest. May use ibuprofen for throat inflammation (find liquid form). Salt water gargles.  Suck on cold things like popsicles or warm things like herbal teas (whichever soothes the throat better). Return if fever >101.5, worsening pain, or trouble opening/closing mouth, or hoarse voice.  Good to see you today, call clinic with questions.   Start monitoring blood pressures more regularly at home after restarting BP medicines, let me know if consistently >150/100 to increase metoprolol dose  Follow up plan: Return if symptoms worsen or fail to improve.  [name], MD

## 2021-06-14 NOTE — Assessment & Plan Note (Addendum)
BP remains elevated - however in setting of not taking BP meds over the past 5 days due to throat pain. He will restart taking and start monitoring BP more closely at home, letting me know if persistently elevated to likely increase BB dosing.

## 2021-06-14 NOTE — Assessment & Plan Note (Addendum)
Anticipate viral pharyngitis. Check RST today - negative Supportive care as per instructions.  Update if not improving over time. Pt agrees with plan.

## 2021-06-14 NOTE — Patient Instructions (Addendum)
You have acute viral pharyngitis. Strep test returned negative  Push fluids and plenty of rest. May use ibuprofen for throat inflammation (find liquid form). Salt water gargles.  Suck on cold things like popsicles or warm things like herbal teas (whichever soothes the throat better). Return if fever >101.5, worsening pain, or trouble opening/closing mouth, or hoarse voice.  Good to see you today, call clinic with questions.   Start monitoring blood pressures more regularly at home after restarting BP medicines, let me know if consistently >150/100 to increase metoprolol dose

## 2021-06-14 NOTE — Assessment & Plan Note (Signed)
O2 dependent. Respiratory status doing better since starting trelegy - continue this.

## 2021-06-14 NOTE — Assessment & Plan Note (Signed)
Overdue for bladder cancer surveillance - declines at this time.

## 2021-06-14 NOTE — Assessment & Plan Note (Signed)
S/p radiation therapy, now undergoing surveillance.

## 2021-06-19 ENCOUNTER — Telehealth: Payer: Self-pay | Admitting: Internal Medicine

## 2021-06-19 NOTE — Telephone Encounter (Signed)
Pt called to cancel his appt on 1-13 with Dr.Vaslow. He did not know anything about this appt. He also wants to have his visits,vitual. Call back at [phone]

## 2021-06-23 ENCOUNTER — Inpatient Hospital Stay: Payer: Medicare Other | Admitting: Internal Medicine

## 2021-06-27 ENCOUNTER — Other Ambulatory Visit: Payer: Self-pay | Admitting: *Deleted

## 2021-06-27 ENCOUNTER — Encounter: Payer: Self-pay | Admitting: Family Medicine

## 2021-06-28 MED ORDER — HYDROCODONE-ACETAMINOPHEN 7.5-325 MG PO TABS: 1.0000 | ORAL_TABLET | ORAL | 0 refills | Status: DC | PRN

## 2021-07-03 DIAGNOSIS — R0602 Shortness of breath: Secondary | ICD-10-CM | POA: Diagnosis not present

## 2021-07-03 DIAGNOSIS — J9621 Acute and chronic respiratory failure with hypoxia: Secondary | ICD-10-CM | POA: Diagnosis not present

## 2021-07-03 DIAGNOSIS — R0600 Dyspnea, unspecified: Secondary | ICD-10-CM | POA: Diagnosis not present

## 2021-07-03 DIAGNOSIS — J95811 Postprocedural pneumothorax: Secondary | ICD-10-CM | POA: Diagnosis not present

## 2021-07-03 DIAGNOSIS — J9611 Chronic respiratory failure with hypoxia: Secondary | ICD-10-CM | POA: Diagnosis not present

## 2021-07-21 ENCOUNTER — Telehealth: Payer: Self-pay

## 2021-07-21 NOTE — Chronic Care Management (AMB) (Addendum)
Chronic Care Management Pharmacy Assistant   Name: Joseph Graham  MRN: [id number] DOB: 1940-07-04  Reason for Encounter: Hypertension Disease State   Recent office visits:  06/14/21-PCP-Patient presented for sore throat.Home covid test-negative.Strep test ordered-negative,start salt water gargles,use ibuprofen for throat inflammation(find liquid form)Suck on cold things like popsicles or warm things like herbal teas (whichever soothes the throat better).Start monitoring blood pressures more regularly at home after restarting BP medicines, let me know if consistently >150/100 to increase metoprolol dose. Push fluids and plenty of rest. 04/24/21-PCP-Telemedicine-Patient presented for follow up COPD. Start Trelegy, refilled albuterol 143mcg/act-follow up 3 months  Recent consult visits:  07/27/21-Oncology-Telemedicine-No medication changes  05/26/21-Oncology-Telemedicine-Patient presented for follow up lung cancer, complaining of tremors. Breathing improved with Trelegy. 03/24/21-Oncology-Telemedicine-No medication changes   Hospital visits:  None in previous 6 months  Medications: Outpatient Encounter Medications as of 07/21/2021  Medication Sig   albuterol (VENTOLIN HFA) 108 (90 Base) MCG/ACT inhaler Inhale 2 puffs into the lungs every 6 (six) hours as needed for wheezing or shortness of breath.   ASPIRIN 81 PO Take 1 tablet by mouth daily.   azelastine (ASTELIN) 0.1 % nasal spray Place 1 spray into both nostrils 2 (two) times daily. Use in each nostril as directed   Fluticasone-Umeclidin-Vilant (TRELEGY ELLIPTA) 100-62.5-25 MCG/ACT AEPB Inhale 1 puff into the lungs daily.   furosemide (LASIX) 40 MG tablet Take 1 tablet (40 mg total) by mouth every other day.   gabapentin (NEURONTIN) 100 MG capsule Take 1 capsule (100 mg total) by mouth daily as needed.   HYDROcodone-acetaminophen (NORCO) 7.5-325 MG tablet Take 1 tablet by mouth every 4 (four) hours as needed for moderate pain.    lisinopril (ZESTRIL) 10 MG tablet TAKE 1 TABLET BY MOUTH EVERY EVENING   metoprolol tartrate (LOPRESSOR) 25 MG tablet Take 1 tablet (25 mg total) by mouth daily.   Multiple Vitamin (MULTIVITAMIN WITH MINERALS) TABS tablet Take 1 tablet by mouth every evening.   naloxone (NARCAN) nasal spray 4 mg/0.1 mL SPRAY 1 SPRAY INTO ONE NOSTRIL AS DIRECTED FOR OPIOID OVERDOSE (TURN PERSON ON SIDE AFTER DOSE. IF NO RESPONSE IN 2-3 MINUTES OR PERSON RESPONDS BUT RELAPSES, REPEAT USING A NEW SPRAY DEVICE AND SPRAY INTO THE OTHER NOSTRIL. CALL 911 AFTER USE.) * EMERGENCY USE ONLY *   NON FORMULARY Take 500 mg by mouth daily. Hemp Oil 500 mg   omeprazole (PRILOSEC) 20 MG capsule Take 20 mg by mouth every morning.    OXYGEN Inhale 2 L into the lungs daily. Using 4 L   oxymetazoline (AFRIN) 0.05 % nasal spray Place 1 spray into both nostrils 2 (two) times daily. As needed   polyethylene glycol (MIRALAX / GLYCOLAX) packet Take 17 g by mouth daily.   potassium chloride (KLOR-CON) 10 MEQ tablet Take 1 tablet (10 mEq total) by mouth every other day.   vitamin B-12 (CYANOCOBALAMIN) 100 MCG tablet Take 100 mcg by mouth every evening.   vitamin E 400 UNIT capsule Take 400 Units by mouth every evening.    No facility-administered encounter medications on file as of 07/21/2021.     Recent Office Vitals: BP Readings from Last 3 Encounters:  06/14/21 (!) 162/58  04/24/21 (!) 152/63  01/18/21 (!) 143/68   Pulse Readings from Last 3 Encounters:  06/14/21 87  04/24/21 62  01/18/21 64    Wt Readings from Last 3 Encounters:  06/14/21 156 lb (70.8 kg)  04/24/21 160 lb 2 oz (72.6 kg)  01/18/21 169 lb (76.7  kg)     Kidney Function Lab Results  Component Value Date/Time   CREATININE 0.60 (L) 01/05/2021 02:43 PM   CREATININE 0.60 (L) 10/03/2020 09:39 AM   CREATININE 0.68 08/08/2010 12:00 AM   GFR 96.08 07/12/2020 07:57 AM   GFRNONAA >60 05/01/2020 05:38 AM   GFRAA >60 02/24/2020 08:21 AM    BMP Latest Ref Rng &  Units 01/05/2021 10/03/2020 07/12/2020  Glucose 70 - 99 mg/dL - - 114(H)  BUN 6 - 23 mg/dL - - 23  Creatinine 0.61 - 1.24 mg/dL 0.60(L) 0.60(L) 0.51  BUN/Creat Ratio 10 - 24 - - -  Sodium 135 - 145 mEq/L - - 138  Potassium 3.5 - 5.1 mEq/L - - 4.3  Chloride 96 - 112 mEq/L - - 94(L)  CO2 19 - 32 mEq/L - - 39(H)  Calcium 8.4 - 10.5 mg/dL - - 9.4   Attempted contact with [name] 3 times on 07/24/21,07/28/21,07/31/21. Unsuccessful outreach. Will attempt contact next month.   Current antihypertensive regimen:  Lisinopril 10 mg - 1 tablet daily Metoprolol tartrate 25 mg - 1/2 tablet twice daily Potassium chloride 10 mEq - 1 tablet every other day (with Lasix) Lasix 40 mg - 1 tablet every other day (for skin weeping)   What recent interventions/DTPs have been made by any provider to improve Blood Pressure control since last CPP Visit: Recommend purchasing a home BP monitor.  Any recent hospitalizations or ED visits since last visit with CPP? No  Adherence Review: Is the patient currently on ACE/ARB medication? Yes Does the patient have >5 day gap between last estimated fill dates? Yes - Lisinopril  Star Rating Drugs:  Medication:  Last Fill: Day Supply Lisinopril 10mg  04/07/21 90  Care Gaps: Annual wellness visit in last year?  Scheduled  08/23/21 Most Recent BP reading:162/58  87-P  If Diabetic: Most recent A1C reading:6.6% 07/12/20 Last eye exam / retinopathy screening:2019 Last diabetic foot exam:2019  Upcoming appointments: PCP appointment on 08/23/21   Debbora Dus, CPP notified  Avel Sensor, [place] Assistant [phone]  I have reviewed the care management and care coordination activities outlined in this encounter and I am certifying that I agree with the content of this note. No further action required.  Debbora Dus, PharmD Clinical Pharmacist [facility] at [facility] [phone]

## 2021-07-24 ENCOUNTER — Encounter: Payer: Self-pay | Admitting: Family Medicine

## 2021-07-24 DIAGNOSIS — J449 Chronic obstructive pulmonary disease, unspecified: Secondary | ICD-10-CM

## 2021-07-24 DIAGNOSIS — J9611 Chronic respiratory failure with hypoxia: Secondary | ICD-10-CM

## 2021-07-27 ENCOUNTER — Other Ambulatory Visit: Payer: Self-pay

## 2021-07-27 ENCOUNTER — Inpatient Hospital Stay: Payer: Medicare Other | Attending: Hospice and Palliative Medicine | Admitting: Hospice and Palliative Medicine

## 2021-07-27 DIAGNOSIS — Z515 Encounter for palliative care: Secondary | ICD-10-CM | POA: Diagnosis not present

## 2021-07-27 DIAGNOSIS — C3411 Malignant neoplasm of upper lobe, right bronchus or lung: Secondary | ICD-10-CM

## 2021-07-27 DIAGNOSIS — G893 Neoplasm related pain (acute) (chronic): Secondary | ICD-10-CM

## 2021-07-27 MED ORDER — METOPROLOL TARTRATE 25 MG PO TABS: 25.0000 mg | ORAL_TABLET | Freq: Every day | ORAL | 1 refills | Status: AC

## 2021-07-27 MED ORDER — LISINOPRIL 10 MG PO TABS: 10.0000 mg | ORAL_TABLET | Freq: Every evening | ORAL | 1 refills | Status: AC

## 2021-07-27 NOTE — Progress Notes (Signed)
Virtual Visit via Telephone Note  I connected with [name] on 07/27/21 at  2:00 PM EST by telephone and verified that I am speaking with the correct person using two identifiers.  Location: Patient: home Provider: clinic   I discussed the limitations, risks, security and privacy concerns of performing an evaluation and management service by telephone and the availability of in person appointments. I also discussed with the patient that there may be a patient responsible charge related to this service. The patient expressed understanding and agreed to proceed.   History of Present Illness: Mr. Raeford Brandenburg is a 81 year old man with multiple medical problems including O2 dependent COPD, CAD, PAD, peripheral neuropathy, and lumbar stenosis with chronic back pain.  Additionally, patient has stage II lung cancer with CT of the chest on 02/24/2020 revealing interval growth and pulmonary nodules.    Patient is status post XRT.  He has had severe chronic back pain limiting treatment and imaging.  He was referred to palliative care to address goals and manage ongoing symptoms.   Observations/Objective: I spoke with patient by phone.  He reports that he is doing reasonably well.  He denies any significant changes or concerns.  He reports stable pain on Norco.  No adverse effects from pain medications reported.   Assessment and Plan: Lung cancer -s/p XRT.  Now on surveillance.  .  Chronic low back pain -continue Norco 7.5-325mg  Q4H PRN and OTC lidocaine patches as needed. PDMP reviewed.   Follow Up Instructions: Follow-up MyChart visit with me in 2 months   I discussed the assessment and treatment plan with the patient. The patient was provided an opportunity to ask questions and all were answered. The patient agreed with the plan and demonstrated an understanding of the instructions.   The patient was advised to call back or seek an in-person evaluation if the symptoms worsen or if the  condition fails to improve as anticipated.  I provided 10 minutes of non-face-to-face time during this encounter.   Irean Hong, NP

## 2021-07-28 DIAGNOSIS — H353232 Exudative age-related macular degeneration, bilateral, with inactive choroidal neovascularization: Secondary | ICD-10-CM | POA: Diagnosis not present

## 2021-08-01 DIAGNOSIS — H524 Presbyopia: Secondary | ICD-10-CM | POA: Diagnosis not present

## 2021-08-03 DIAGNOSIS — J95811 Postprocedural pneumothorax: Secondary | ICD-10-CM | POA: Diagnosis not present

## 2021-08-03 DIAGNOSIS — R0602 Shortness of breath: Secondary | ICD-10-CM | POA: Diagnosis not present

## 2021-08-03 DIAGNOSIS — R0600 Dyspnea, unspecified: Secondary | ICD-10-CM | POA: Diagnosis not present

## 2021-08-03 DIAGNOSIS — J9621 Acute and chronic respiratory failure with hypoxia: Secondary | ICD-10-CM | POA: Diagnosis not present

## 2021-08-03 DIAGNOSIS — J9611 Chronic respiratory failure with hypoxia: Secondary | ICD-10-CM | POA: Diagnosis not present

## 2021-08-07 NOTE — Telephone Encounter (Signed)
He stated that his machine is not working, its blinking red , and the company that handles it is not refilling his oxygen and he doesn't know if he needs a new script.

## 2021-08-07 NOTE — Telephone Encounter (Signed)
[name] called in and wanted to know why he is waiting for a response due to he been waiting since the beginning of febuary

## 2021-08-09 NOTE — Addendum Note (Signed)
Addended by: [name] on: 08/09/2021 07:24 AM ? ? Modules accepted: Orders ? ?

## 2021-08-09 NOTE — Telephone Encounter (Signed)
Sent Community Msg via Epic to [employer] notifying them of order.  Also, faxed written order to [place] at [phone]. ?

## 2021-08-09 NOTE — Telephone Encounter (Addendum)
I've placed a new order in chart for oxygen use. ?I've written a script for oxygen and placed in Joseph Graham's box.  ?Let me know if there's anything else I need to do. ?

## 2021-08-15 ENCOUNTER — Other Ambulatory Visit: Payer: Self-pay | Admitting: *Deleted

## 2021-08-15 MED ORDER — HYDROCODONE-ACETAMINOPHEN 7.5-325 MG PO TABS: 1.0000 | ORAL_TABLET | ORAL | 0 refills | Status: DC | PRN

## 2021-08-16 ENCOUNTER — Telehealth: Payer: Self-pay

## 2021-08-16 MED ORDER — POTASSIUM CHLORIDE ER 10 MEQ PO TBCR: 10.0000 meq | EXTENDED_RELEASE_TABLET | ORAL | 0 refills | Status: AC

## 2021-08-16 MED ORDER — FUROSEMIDE 40 MG PO TABS: 40.0000 mg | ORAL_TABLET | ORAL | 0 refills | Status: AC

## 2021-08-16 NOTE — Telephone Encounter (Signed)
E-scribed refills.  

## 2021-08-18 ENCOUNTER — Other Ambulatory Visit: Payer: Self-pay

## 2021-08-18 ENCOUNTER — Emergency Department: Payer: Medicare Other

## 2021-08-18 ENCOUNTER — Inpatient Hospital Stay
Admission: EM | Admit: 2021-08-18 | Discharge: 2021-08-22 | DRG: 522 | Disposition: A | Discharge status: Skilled Nursing Facility | Payer: Medicare Other | Attending: Internal Medicine | Admitting: Internal Medicine

## 2021-08-18 DIAGNOSIS — I10 Essential (primary) hypertension: Secondary | ICD-10-CM | POA: Diagnosis present

## 2021-08-18 DIAGNOSIS — Z79899 Other long term (current) drug therapy: Secondary | ICD-10-CM | POA: Diagnosis not present

## 2021-08-18 DIAGNOSIS — Z9842 Cataract extraction status, left eye: Secondary | ICD-10-CM

## 2021-08-18 DIAGNOSIS — Z8249 Family history of ischemic heart disease and other diseases of the circulatory system: Secondary | ICD-10-CM | POA: Diagnosis not present

## 2021-08-18 DIAGNOSIS — Z7982 Long term (current) use of aspirin: Secondary | ICD-10-CM | POA: Diagnosis not present

## 2021-08-18 DIAGNOSIS — S72002D Fracture of unspecified part of neck of left femur, subsequent encounter for closed fracture with routine healing: Secondary | ICD-10-CM | POA: Diagnosis not present

## 2021-08-18 DIAGNOSIS — Z8052 Family history of malignant neoplasm of bladder: Secondary | ICD-10-CM | POA: Diagnosis not present

## 2021-08-18 DIAGNOSIS — Z743 Need for continuous supervision: Secondary | ICD-10-CM | POA: Diagnosis not present

## 2021-08-18 DIAGNOSIS — S72002A Fracture of unspecified part of neck of left femur, initial encounter for closed fracture: Secondary | ICD-10-CM | POA: Diagnosis not present

## 2021-08-18 DIAGNOSIS — G250 Essential tremor: Secondary | ICD-10-CM | POA: Diagnosis present

## 2021-08-18 DIAGNOSIS — W010XXA Fall on same level from slipping, tripping and stumbling without subsequent striking against object, initial encounter: Secondary | ICD-10-CM | POA: Diagnosis present

## 2021-08-18 DIAGNOSIS — W19XXXA Unspecified fall, initial encounter: Secondary | ICD-10-CM

## 2021-08-18 DIAGNOSIS — Y92009 Unspecified place in unspecified non-institutional (private) residence as the place of occurrence of the external cause: Secondary | ICD-10-CM | POA: Diagnosis not present

## 2021-08-18 DIAGNOSIS — Z471 Aftercare following joint replacement surgery: Secondary | ICD-10-CM | POA: Diagnosis not present

## 2021-08-18 DIAGNOSIS — G620 Drug-induced polyneuropathy: Secondary | ICD-10-CM | POA: Diagnosis present

## 2021-08-18 DIAGNOSIS — I2510 Atherosclerotic heart disease of native coronary artery without angina pectoris: Secondary | ICD-10-CM | POA: Diagnosis not present

## 2021-08-18 DIAGNOSIS — Z8049 Family history of malignant neoplasm of other genital organs: Secondary | ICD-10-CM

## 2021-08-18 DIAGNOSIS — T451X5A Adverse effect of antineoplastic and immunosuppressive drugs, initial encounter: Secondary | ICD-10-CM | POA: Diagnosis not present

## 2021-08-18 DIAGNOSIS — Z9981 Dependence on supplemental oxygen: Secondary | ICD-10-CM | POA: Diagnosis not present

## 2021-08-18 DIAGNOSIS — Z9221 Personal history of antineoplastic chemotherapy: Secondary | ICD-10-CM

## 2021-08-18 DIAGNOSIS — M6281 Muscle weakness (generalized): Secondary | ICD-10-CM | POA: Diagnosis not present

## 2021-08-18 DIAGNOSIS — R9431 Abnormal electrocardiogram [ECG] [EKG]: Secondary | ICD-10-CM | POA: Diagnosis not present

## 2021-08-18 DIAGNOSIS — E119 Type 2 diabetes mellitus without complications: Secondary | ICD-10-CM | POA: Diagnosis present

## 2021-08-18 DIAGNOSIS — Z85118 Personal history of other malignant neoplasm of bronchus and lung: Secondary | ICD-10-CM

## 2021-08-18 DIAGNOSIS — R278 Other lack of coordination: Secondary | ICD-10-CM | POA: Diagnosis not present

## 2021-08-18 DIAGNOSIS — J449 Chronic obstructive pulmonary disease, unspecified: Secondary | ICD-10-CM | POA: Diagnosis not present

## 2021-08-18 DIAGNOSIS — R1312 Dysphagia, oropharyngeal phase: Secondary | ICD-10-CM | POA: Diagnosis not present

## 2021-08-18 DIAGNOSIS — M1611 Unilateral primary osteoarthritis, right hip: Secondary | ICD-10-CM | POA: Diagnosis not present

## 2021-08-18 DIAGNOSIS — J9611 Chronic respiratory failure with hypoxia: Secondary | ICD-10-CM | POA: Diagnosis not present

## 2021-08-18 DIAGNOSIS — Z8551 Personal history of malignant neoplasm of bladder: Secondary | ICD-10-CM

## 2021-08-18 DIAGNOSIS — R6889 Other general symptoms and signs: Secondary | ICD-10-CM | POA: Diagnosis not present

## 2021-08-18 DIAGNOSIS — S72009A Fracture of unspecified part of neck of unspecified femur, initial encounter for closed fracture: Secondary | ICD-10-CM | POA: Diagnosis present

## 2021-08-18 DIAGNOSIS — Z87891 Personal history of nicotine dependence: Secondary | ICD-10-CM

## 2021-08-18 DIAGNOSIS — Z20822 Contact with and (suspected) exposure to covid-19: Secondary | ICD-10-CM | POA: Diagnosis not present

## 2021-08-18 DIAGNOSIS — R0902 Hypoxemia: Secondary | ICD-10-CM | POA: Diagnosis not present

## 2021-08-18 DIAGNOSIS — E785 Hyperlipidemia, unspecified: Secondary | ICD-10-CM | POA: Diagnosis not present

## 2021-08-18 DIAGNOSIS — Z9841 Cataract extraction status, right eye: Secondary | ICD-10-CM

## 2021-08-18 DIAGNOSIS — D519 Vitamin B12 deficiency anemia, unspecified: Secondary | ICD-10-CM | POA: Diagnosis not present

## 2021-08-18 DIAGNOSIS — D539 Nutritional anemia, unspecified: Secondary | ICD-10-CM | POA: Diagnosis present

## 2021-08-18 DIAGNOSIS — Z823 Family history of stroke: Secondary | ICD-10-CM

## 2021-08-18 DIAGNOSIS — R2689 Other abnormalities of gait and mobility: Secondary | ICD-10-CM | POA: Diagnosis not present

## 2021-08-18 DIAGNOSIS — R531 Weakness: Secondary | ICD-10-CM | POA: Diagnosis not present

## 2021-08-18 DIAGNOSIS — Z7401 Bed confinement status: Secondary | ICD-10-CM | POA: Diagnosis not present

## 2021-08-18 LAB — CBC WITH DIFFERENTIAL/PLATELET
Abs Immature Granulocytes: 0.08 10*3/uL — ABNORMAL HIGH (ref 0.00–0.07)
Basophils Absolute: 0 10*3/uL (ref 0.0–0.1)
Basophils Relative: 0 %
Eosinophils Absolute: 0 10*3/uL (ref 0.0–0.5)
Eosinophils Relative: 0 %
HCT: 30.7 % — ABNORMAL LOW (ref 39.0–52.0)
Hemoglobin: 9.6 g/dL — ABNORMAL LOW (ref 13.0–17.0)
Immature Granulocytes: 1 %
Lymphocytes Relative: 3 %
Lymphs Abs: 0.4 10*3/uL — ABNORMAL LOW (ref 0.7–4.0)
MCH: 31.4 pg (ref 26.0–34.0)
MCHC: 31.3 g/dL (ref 30.0–36.0)
MCV: 100.3 fL — ABNORMAL HIGH (ref 80.0–100.0)
Monocytes Absolute: 0.7 10*3/uL (ref 0.1–1.0)
Monocytes Relative: 6 %
Neutro Abs: 10.8 10*3/uL — ABNORMAL HIGH (ref 1.7–7.7)
Neutrophils Relative %: 90 %
Platelets: 228 10*3/uL (ref 150–400)
RBC: 3.06 MIL/uL — ABNORMAL LOW (ref 4.22–5.81)
RDW: 14.5 % (ref 11.5–15.5)
WBC: 12 10*3/uL — ABNORMAL HIGH (ref 4.0–10.5)
nRBC: 0 % (ref 0.0–0.2)

## 2021-08-18 LAB — SURGICAL PCR SCREEN
MRSA, PCR: NEGATIVE
Staphylococcus aureus: NEGATIVE

## 2021-08-18 LAB — PROTIME-INR
INR: 1.2 (ref 0.8–1.2)
Prothrombin Time: 15.6 seconds — ABNORMAL HIGH (ref 11.4–15.2)

## 2021-08-18 LAB — COMPREHENSIVE METABOLIC PANEL
ALT: 19 U/L (ref 0–44)
AST: 41 U/L (ref 15–41)
Albumin: 4.2 g/dL (ref 3.5–5.0)
Alkaline Phosphatase: 73 U/L (ref 38–126)
Anion gap: 11 (ref 5–15)
BUN: 33 mg/dL — ABNORMAL HIGH (ref 8–23)
CO2: 30 mmol/L (ref 22–32)
Calcium: 9.2 mg/dL (ref 8.9–10.3)
Chloride: 99 mmol/L (ref 98–111)
Creatinine, Ser: 0.9 mg/dL (ref 0.61–1.24)
GFR, Estimated: >60 mL/min (ref >60)
Glucose, Bld: 75 mg/dL (ref 70–99)
Potassium: 4.3 mmol/L (ref 3.5–5.1)
Sodium: 140 mmol/L (ref 135–145)
Total Bilirubin: 1.1 mg/dL (ref 0.3–1.2)
Total Protein: 8.1 g/dL (ref 6.5–8.1)

## 2021-08-18 LAB — RESP PANEL BY RT-PCR (FLU A&B, COVID) ARPGX2
Influenza A by PCR: NEGATIVE
Influenza B by PCR: NEGATIVE
SARS Coronavirus 2 by RT PCR: NEGATIVE

## 2021-08-18 LAB — FERRITIN: Ferritin: 34 ng/mL (ref 24–336)

## 2021-08-18 LAB — FOLATE: Folate: 13 ng/mL (ref >5.9)

## 2021-08-18 LAB — IRON AND TIBC
Iron: 56 ug/dL (ref 45–182)
Saturation Ratios: 16 % — ABNORMAL LOW (ref 17.9–39.5)
TIBC: 344 ug/dL (ref 250–450)
UIBC: 288 ug/dL

## 2021-08-18 LAB — RETICULOCYTES
Immature Retic Fract: 18.6 % — ABNORMAL HIGH (ref 2.3–15.9)
RBC.: 2.99 MIL/uL — ABNORMAL LOW (ref 4.22–5.81)
Retic Count, Absolute: 37.7 10*3/uL (ref 19.0–186.0)
Retic Ct Pct: 1.3 % (ref 0.4–3.1)

## 2021-08-18 LAB — TYPE AND SCREEN
ABO/RH(D): AB POS
Antibody Screen: NEGATIVE

## 2021-08-18 LAB — APTT: aPTT: 34 seconds (ref 24–36)

## 2021-08-18 LAB — VITAMIN B12: Vitamin B-12: 633 pg/mL (ref 180–914)

## 2021-08-18 MED ORDER — HYDRALAZINE HCL 20 MG/ML IJ SOLN
10.0000 mg | Freq: Four times a day (QID) | INTRAMUSCULAR | Status: DC | PRN
  Administered 2021-08-18: 10 mg via INTRAVENOUS
  Filled 2021-08-18: qty 1

## 2021-08-18 MED ORDER — FENTANYL CITRATE PF 50 MCG/ML IJ SOSY
50.0000 ug | PREFILLED_SYRINGE | Freq: Once | INTRAMUSCULAR | Status: AC
  Administered 2021-08-18: 50 ug via INTRAVENOUS
  Filled 2021-08-18: qty 1

## 2021-08-18 MED ORDER — METHOCARBAMOL 1000 MG/10ML IJ SOLN
500.0000 mg | Freq: Four times a day (QID) | INTRAVENOUS | Status: DC | PRN
  Filled 2021-08-18: qty 5

## 2021-08-18 MED ORDER — METHOCARBAMOL 500 MG PO TABS
500.0000 mg | ORAL_TABLET | Freq: Four times a day (QID) | ORAL | Status: DC | PRN
  Administered 2021-08-18 – 2021-08-22 (×5): 500 mg via ORAL
  Filled 2021-08-18 (×6): qty 1

## 2021-08-18 MED ORDER — TRANEXAMIC ACID-NACL 1000-0.7 MG/100ML-% IV SOLN
1000.0000 mg | INTRAVENOUS | Status: AC
  Administered 2021-08-19 (×2): 1000 mg via INTRAVENOUS

## 2021-08-18 MED ORDER — MORPHINE SULFATE (PF) 2 MG/ML IV SOLN
0.5000 mg | INTRAVENOUS | Status: DC | PRN
  Administered 2021-08-18 – 2021-08-19 (×6): 0.5 mg via INTRAVENOUS
  Filled 2021-08-18 (×6): qty 1

## 2021-08-18 MED ORDER — TRANEXAMIC ACID-NACL 1000-0.7 MG/100ML-% IV SOLN: 1000.0000 mg | INTRAVENOUS | Status: AC

## 2021-08-18 MED ORDER — CEFAZOLIN (ANCEF) 1 G IV SOLR: 2.0000 g | INTRAVENOUS | Status: DC

## 2021-08-18 MED ORDER — MUPIROCIN 2 % EX OINT
1.0000 "application " | TOPICAL_OINTMENT | Freq: Two times a day (BID) | CUTANEOUS | Status: DC
  Administered 2021-08-19 – 2021-08-22 (×6): 1 via NASAL
  Filled 2021-08-18: qty 22

## 2021-08-18 MED ORDER — ADULT MULTIVITAMIN W/MINERALS CH
1.0000 | ORAL_TABLET | Freq: Every day | ORAL | Status: DC
  Administered 2021-08-20 – 2021-08-22 (×3): 1 via ORAL
  Filled 2021-08-18 (×4): qty 1

## 2021-08-18 MED ORDER — CEFAZOLIN SODIUM-DEXTROSE 2-4 GM/100ML-% IV SOLN
2.0000 g | INTRAVENOUS | Status: AC
  Administered 2021-08-19: 2 g via INTRAVENOUS

## 2021-08-18 MED ORDER — ENSURE ENLIVE PO LIQD
237.0000 mL | Freq: Two times a day (BID) | ORAL | Status: DC
  Administered 2021-08-19 – 2021-08-21 (×3): 237 mL via ORAL

## 2021-08-18 NOTE — Plan of Care (Signed)

## 2021-08-18 NOTE — Progress Notes (Signed)
Pt unable to sign surgical consent due to severe tremors, wife in room signed consent in his place.  Consent in front of chart. ?

## 2021-08-18 NOTE — ED Notes (Signed)
Pt last ate yesterday evening- dinner, last drank water this morning. ?

## 2021-08-18 NOTE — Progress Notes (Signed)
Initial Nutrition Assessment ? ?DOCUMENTATION CODES:  ? ?Not applicable ? ?INTERVENTION:  ? ?-Feeding assistance with meals ?-MVI with minerals daily ?-Ensure Enlive po BID, each supplement provides 350 kcal and 20 grams of protein.  ?-Liberalize diet to regular ? ?NUTRITION DIAGNOSIS:  ? ?Increased nutrient needs related to post-op healing as evidenced by estimated needs. ? ?GOAL:  ? ?Patient will meet greater than or equal to 90% of their needs ? ?MONITOR:  ? ?PO intake, Supplement acceptance, Labs, Weight trends, Skin, I & O's ? ?REASON FOR ASSESSMENT:  ? ?Consult ?Assessment of nutrition requirement/status, Hip fracture protocol ? ?ASSESSMENT:  ? ?Joseph Graham is a 81 y.o. male with history of severe COPD with chronic hypoxic respiratory failure on 5 L oxygen, hypertension, B12 deficiency anemia, lung cancer and bladder cancer in remission, presents to the ER after patient had a fall yesterday.  Since the fall patient has been having left hip pain.  Patient states he was trying to wear his neck brace which he wears for neck arthritis when he slipped and fell on his back.  Not hit his head or lose consciousness. ? ?Pt admitted with rt hip fracture s/p mechanical fall.  ? ?Case discussed with RN, who reports pt just arrived to units and is in a lot of pain. Lunch tray has been set up for pt. Pt with tremors, but able to reach for items off meal tray. He has difficulty opening packages.  ? ?Per orthopedics notes, plan for hemiarthroplasty tomorrow. Pt will be NPO at midnight.  ? ?Spoke with pt at bedside, who reports good appetite PTA. He usually consumes 3-4 meals per day.   ? ?Reviewed wt hx; pt has experienced a 11% wt loss over the past 10 months, which is not significant for time frame. Pt reports that his weight "ballooned up" when he underwent cancer treatments and has steadily reclined since finishing treatments. He reports his UBW is around 160#.  ? ?Discussed importance of good meal and supplement  intake to promote healing. Pt shares that he thinks he would eat better "if I had some salt". RD will liberalize diet to increase variety of meals selections.  ? ?Lab Results  ?Component Value Date  ? HGBA1C 6.6 (H) 07/12/2020  ? PTA DM medications are none.  ? ?Labs reviewed: CBGS: 111. ? ?NUTRITION - FOCUSED PHYSICAL EXAM: ? ?Flowsheet Row Most Recent Value  ?Orbital Region No depletion  ?Upper Arm Region Mild depletion  ?Thoracic and Lumbar Region No depletion  ?Buccal Region No depletion  ?Temple Region No depletion  ?Clavicle Bone Region No depletion  ?Clavicle and Acromion Bone Region No depletion  ?Scapular Bone Region No depletion  ?Dorsal Hand No depletion  ?Patellar Region No depletion  ?Anterior Thigh Region No depletion  ?Posterior Calf Region No depletion  ?Edema (RD Assessment) None  ?Hair Reviewed  ?Eyes Reviewed  ?Mouth Reviewed  ?Skin Reviewed  ?Nails Reviewed  ? ?  ? ? ?Diet Order:   ?Diet Order   ? ?       ?  Diet Heart Room service appropriate? Yes; Fluid consistency: Thin  Diet effective now       ?  ? ?  ?  ? ?  ? ? ?EDUCATION NEEDS:  ? ?Education needs have been addressed ? ?Skin:  Skin Assessment: Reviewed RN Assessment ? ?Last BM:  08/18/21 ? ?Height:  ? ?Ht Readings from Last 1 Encounters:  ?08/18/21 5\' 8"  (1.727 m)  ? ? ?Weight:  ? ?  Wt Readings from Last 1 Encounters:  ?08/18/21 72.6 kg  ? ? ?Ideal Body Weight:  70 kg ? ?BMI:  Body mass index is 24.33 kg/m?. ? ?Estimated Nutritional Needs:  ? ?Kcal:  1950-2150 ? ?Protein:  90-05 grams ? ?Fluid:  > 1.9 L ? ? ? ?Loistine Chance, RD, LDN, CDCES ?Registered Dietitian II ?Certified Diabetes Care and Education Specialist ?Please refer to [facility] for RD and/or RD on-call/weekend/after hours pager  ?

## 2021-08-18 NOTE — ED Notes (Signed)
Pt states he does not take blood thinners besides baby aspirin daily. Pt placed on 2 liter [place].  ?

## 2021-08-18 NOTE — ED Notes (Signed)
Informed RN bed assigned 

## 2021-08-18 NOTE — ED Provider Notes (Signed)
? ?[facility] ?Provider Note ? ? ? Event Date/Time  ? First MD Initiated Contact with Patient 08/18/21 1323   ?  (approximate) ? ? ?History  ? ?Fall ? ? ?HPI ? ?Joseph Graham is a 81 y.o. male with extensive past medical history including CAD, diabetes, COPD who presents for fall.  Patient reports he fell last night from standing position, and injured his left hip at that time.  At the fire department helped him back into his chair.  This morning unable to ambulate, complains of pain primarily in his left hip.  No head injury ?  ? ? ?Physical Exam  ? ?Triage Vital Signs: ?ED Triage Vitals [08/18/21 1314]  ?Enc Vitals Group  ?   BP (!) 152/59  ?   Pulse Rate 95  ?   Resp 18  ?   Temp 98.6 ?F (37 ?C)  ?   Temp Source Oral  ?   SpO2 90 %  ?   Weight 72.6 kg (160 lb)  ?   Height 1.727 m (5\' 8" )  ?   Head Circumference   ?   Peak Flow   ?   Pain Score 2  ?   Pain Loc   ?   Pain Edu?   ?   Excl. in [place]?   ? ? ?Most recent vital signs: ?Vitals:  ? 08/18/21 1314 08/18/21 1400  ?BP: (!) 152/59 (!) 188/78  ?Pulse: 95 96  ?Resp: 18 (!) 28  ?Temp: 98.6 ?F (37 ?C)   ?SpO2: 90% 93%  ? ? ? ?General: Awake, no distress.  ?CV:  Good peripheral perfusion.  ?Resp:  Normal effort.  ?Abd:  No distention.  ?Other:  Left hip is externally rotated and shortened, warm and well perfused, skin is normal ? ? ?ED Results / Procedures / Treatments  ? ?Labs ?(all labs ordered are listed, but only abnormal results are displayed) ?Labs Reviewed  ?CBC WITH DIFFERENTIAL/PLATELET - Abnormal; Notable for the following components:  ?    Result Value  ? WBC 12.0 (*)   ? RBC 3.06 (*)   ? Hemoglobin 9.6 (*)   ? HCT 30.7 (*)   ? MCV 100.3 (*)   ? Neutro Abs 10.8 (*)   ? Lymphs Abs 0.4 (*)   ? Abs Immature Granulocytes 0.08 (*)   ? All other components within normal limits  ?COMPREHENSIVE METABOLIC PANEL - Abnormal; Notable for the following components:  ? BUN 33 (*)   ? All other components within normal limits  ?RESP PANEL BY RT-PCR  (FLU A&B, COVID) [id number]  ?APTT  ?PROTIME-INR  ? ? ? ?EKG ? ?ED ECG REPORT ?I, Lavonia Drafts, the attending physician, personally viewed and interpreted this ECG. ? ?Date: 08/18/2021 ? ?Rhythm: normal sinus rhythm ?QRS Axis: normal ?Intervals: Left anterior ventricular block ?ST/T Wave abnormalities: Nonspecific changes ?Narrative Interpretation: no evidence of acute ischemia ? ? ? ?RADIOLOGY ?Hip x-ray viewed and interpreted by me, left femoral neck fracture ? ? ? ?PROCEDURES: ? ?Critical Care performed:  ? ?Procedures ? ? ?MEDICATIONS ORDERED IN ED: ?Medications  ?fentaNYL (SUBLIMAZE) injection 50 mcg (50 mcg Intravenous Given 08/18/21 1400)  ? ? ? ?IMPRESSION / MDM / ASSESSMENT AND PLAN / ED COURSE  ?I reviewed the triage vital signs and the nursing notes. ? ? ?Patient presents after a fall as described above.  Exam is concerning for left hip fracture.  Will give IV fentanyl, pending x-ray ? ?Blood work reviewed, mild  elevation of white blood cell count is nonspecific, CMP is reassuring ? ?Hip x-ray reviewed by me, consistent with femoral neck fracture ? ?Consulted and discussed with Dr. Gaspar Bidding of orthopedics who recommends admission to medicine with plan for surgery tomorrow after clearance ? ?Consult medicine for admission ? ? ? ? ? ? ?  ? ? ?FINAL CLINICAL IMPRESSION(S) / ED DIAGNOSES  ? ?Final diagnoses:  ?Closed fracture of left hip, initial encounter ([place])  ? ? ? ?Rx / DC Orders  ? ?ED Discharge Orders   ? ? None  ? ?  ? ? ? ?Note:  This document was prepared using Dragon voice recognition software and may include unintentional dictation errors. ?  ?Lavonia Drafts, MD ?08/18/21 1406 ? ?

## 2021-08-18 NOTE — ED Triage Notes (Addendum)
Per ems pt had mechanical fall last night, no loc, and was placed in chair by fire rescue. Pt c/o decreased mobility and increased weakness this morning- stated family had to help him stand and walk and he normally can walk with walker unassisted. CGB 110, pt on 4-5 liter's chronically for COPD. EMS  Pt AOX4, c/o hip pain, NAD noted. Pt states pain is worse with movement.  ?

## 2021-08-18 NOTE — H&P (Signed)
History and Physical    Joseph Graham JIR:[id number] DOB: 07/14/1940 DOA: 08/18/2021  PCP: [name], MD  Patient coming from: Home.  Chief Complaint: Follow-up.  HPI: Joseph Graham is a 81 y.o. male with history of severe COPD with chronic hypoxic respiratory failure on 5 L oxygen, hypertension, B12 deficiency anemia, lung cancer and bladder cancer in remission, presents to the ER after patient had a fall yesterday.  Since the fall patient has been having left hip pain.  Patient states he was trying to wear his neck brace which he wears for neck arthritis when he slipped and fell on his back.  Not hit his head or lose consciousness.  ED Course: X-rays in the ER revealed left hip fracture.  On-call orthopedic surgeon Dr. Gaspar Bidding was consulted.  Chest x-ray does not show anything acute.  EKG shows sinus rhythm with RBBB.  COVID test negative hemoglobin is mildly decreased from his baseline with a macrocytic picture.  Mild leukocytosis.  Review of Systems: As per HPI, rest all negative.   Past Medical History:  Diagnosis Date   Alcohol abuse, in remission    remote    Arthritis    BACK AND NECK   Benign essential tremor    improved on metoprolol and gabapentin   BPH (benign prostatic hypertrophy) 12/30/2012   Cardiac arrhythmia 03/2010   h/o a flutter and CM, s/p ablation, normal stress test   Cataract    bilat removed    Chemotherapy-induced neutropenia ([place]) 09/23/2015   Chronic low back pain    MRI 2004, multilevel DDD   Colon polyps    next colonoscopy due around 7510   Complication of anesthesia age 2-23   DID NOT GET COMPLETELY NUMB WITH TONSILLECTOMY   COPD (chronic obstructive pulmonary disease) ([place])    Coronary artery disease    Diabetes mellitus without complication ([place])    Pt states his A1C is 6.7 and is diet controlled.    Dyslipidemia    mild off meds   GERD (gastroesophageal reflux disease)    History of kidney stones    h/o   HTN (hypertension)     Macular degeneration, bilateral    Multiple pulmonary nodules 02/2015   Neuromuscular disorder (HCC)    neuropathy from chemo....in feet   Peripheral neuropathy due to chemotherapy ([place]) 12/23/2015   Saw Dr Manuella Ghazi, on gabapentin and cymbalta with hydrocodone PRN (03/2016)   Personal history of tobacco use, presenting hazards to health 02/18/2015   quit 06/2011, restarted 2014   Pneumothorax after biopsy 04/01/2015   Pre-diabetes    Primary cancer of bladder ([place]) 2016   Budzyn   Primary lung cancer ([place]) 2016   port a cath removed 03/2016   Shortness of breath dyspnea    OCC WITH EXERTION    Past Surgical History:  Procedure Laterality Date   A FLUTTER ABLATION  03/2010   carotid US  03/2010   WNL   CATARACT EXTRACTION     COLONOSCOPY  09/2011   polyps, rpt due 5 yrs, mild diverticulosis [name])   CYSTOSCOPY W/ RETROGRADES Bilateral 04/06/2015   Procedure: CYSTOSCOPY WITH RETROGRADE PYELOGRAM;  Surgeon: [name], MD;  Location: [facility];  Service: Urology;  Laterality: Bilateral;   ELECTROMAGNETIC NAVIGATION BROCHOSCOPY N/A 03/02/2015   Procedure: ELECTROMAGNETIC NAVIGATION BRONCHOSCOPY;  Surgeon: [name], MD;  Location: [facility];  Service: Cardiopulmonary;  Laterality: N/A;   HEMI-MICRODISCECTOMY LUMBAR LAMINECTOMY LEVEL 1 N/A 01/26/2019   LEFT L5-S1 FAR LATERAL APPROACH  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 [name], [name], MD)   [place]     collapsed lung with chest tube   POLYPECTOMY     PORT-A-CATH REMOVAL Right 04/03/2016   Procedure: REMOVAL PORT-A-CATH;  Surgeon: [name], MD;  Location: [facility];  Service: General;  Laterality: Right;   PORTACATH PLACEMENT N/A 08/10/2015   Procedure: INSERTION PORT-A-CATH;  Surgeon: [name], MD;  Location: [facility];  Service: General;  Laterality: N/A;   TONSILLECTOMY  1964   TRANSURETHRAL RESECTION OF BLADDER TUMOR N/A 04/06/2015   Procedure: TRANSURETHRAL RESECTION OF  BLADDER TUMOR (TURBT) right ureteral stent placement;  Surgeon: [name], MD;  Location: [facility];  Service: Urology;  Laterality: N/A;   TRANSURETHRAL RESECTION OF BLADDER TUMOR N/A 07/13/2015   Procedure: TRANSURETHRAL RESECTION OF BLADDER TUMOR (TURBT);  Surgeon: [name], MD;  Location: [facility];  Service: Urology;  Laterality: N/A;   urinary stent removal  04/14/15   VIDEO ASSISTED THORACOSCOPY (VATS) W/TALC PLEUADESIS Right 04/27/2020   Procedure: VIDEO ASSISTED THORACOSCOPY (VATS) W/TALC PLEUADESIS;  Surgeon: [name], MD;  Location: [facility];  Service: Thoracic;  Laterality: Right;     reports that he quit smoking about 6 years ago. His smoking use included cigarettes. He has a 91.50 pack-year smoking history. He has never used smokeless tobacco. He reports that he does not drink alcohol and does not use drugs.  No Active Allergies  Family History  Problem Relation Age of Onset   Cervical cancer Mother 64   Hypertension Father    Tremor Father        and several uncles/aunts (not parkinson's)   Stroke Brother 21   Bladder Cancer Brother 64   Prostate cancer Neg Hx    Kidney cancer Neg Hx    Colon cancer Neg Hx    Colon polyps Neg Hx    Esophageal cancer Neg Hx    Rectal cancer Neg Hx     Prior to Admission medications   Medication Sig Start Date End Date Taking? Authorizing Provider  ASPIRIN 81 PO Take 1 tablet by mouth daily.   Yes [provider]  Fluticasone-Umeclidin-Vilant (TRELEGY ELLIPTA) 100-62.5-25 MCG/ACT AEPB Inhale 1 puff into the lungs daily. 04/26/21  Yes [name], MD  HYDROcodone-acetaminophen (NORCO) 7.5-325 MG tablet Take 1 tablet by mouth every 4 (four) hours as needed for moderate pain. 08/15/21  Yes Borders, Kirt Boys, NP  lisinopril (ZESTRIL) 10 MG tablet Take 1 tablet (10 mg total) by mouth every evening. 07/27/21  Yes [name], MD  metoprolol tartrate (LOPRESSOR) 25 MG tablet Take 1 tablet (25 mg total) by  mouth daily. 07/27/21  Yes [name], MD  Multiple Vitamin (MULTIVITAMIN WITH MINERALS) TABS tablet Take 1 tablet by mouth every evening.   Yes [provider]  NON FORMULARY Take 500 mg by mouth daily. Hemp Oil 500 mg   Yes [provider]  omeprazole (PRILOSEC) 20 MG capsule Take 20 mg by mouth every morning.    Yes [provider]  oxymetazoline (AFRIN) 0.05 % nasal spray Place 1 spray into both nostrils 2 (two) times daily. As needed   Yes [provider]  vitamin B-12 (CYANOCOBALAMIN) 100 MCG tablet Take 100 mcg by mouth every evening.   Yes [provider]  vitamin E 400 UNIT capsule Take 400 Units by mouth every evening.    Yes [provider]  albuterol (VENTOLIN HFA) 108 (90 Base) MCG/ACT  inhaler Inhale 2 puffs into the lungs every 6 (six) hours as needed for wheezing or shortness of breath. 04/24/21   [name], MD  azelastine (ASTELIN) 0.1 % nasal spray Place 1 spray into both nostrils 2 (two) times daily. Use in each nostril as directed Patient not taking: Reported on 08/18/2021 07/18/20   [name], MD  furosemide (LASIX) 40 MG tablet Take 1 tablet (40 mg total) by mouth every other day. 08/16/21   [name], MD  gabapentin (NEURONTIN) 100 MG capsule Take 1 capsule (100 mg total) by mouth daily as needed. 01/18/21   [name], MD  naloxone [facility]) nasal spray 4 mg/0.1 mL SPRAY 1 SPRAY INTO ONE NOSTRIL AS DIRECTED FOR OPIOID OVERDOSE (TURN PERSON ON SIDE AFTER DOSE. IF NO RESPONSE IN 2-3 MINUTES OR PERSON RESPONDS BUT RELAPSES, REPEAT USING A NEW SPRAY DEVICE AND SPRAY INTO THE OTHER NOSTRIL. CALL 911 AFTER USE.) * EMERGENCY USE ONLY * 10/05/20   Borders, Kirt Boys, NP  OXYGEN Inhale 2 L into the lungs daily. Using 4 L    [provider]  polyethylene glycol (MIRALAX / GLYCOLAX) packet Take 17 g by mouth daily.    [provider]  potassium chloride (KLOR-CON) 10 MEQ tablet Take 1 tablet  (10 mEq total) by mouth every other day. 08/16/21   [name], MD    Physical Exam: Constitutional: Moderately built and nourished. Vitals:   08/18/21 1405 08/18/21 1410 08/18/21 1430 08/18/21 1507  BP:   (!) 174/70 (!) 195/65  Pulse: 100 96 95 96  Resp: (!) 21 19 (!) 27 18  Temp:    98.6 F (37 C)  TempSrc:      SpO2: (!) 81% 95% 96% 97%  Weight:      Height:       Eyes: Anicteric no pallor. ENMT: No discharge from the ears eyes nose and mouth. Neck: No mass felt.  No neck rigidity. Respiratory: No rhonchi or crepitations. Cardiovascular: S1-S2 heard. Abdomen: Soft nontender bowel sound present. Musculoskeletal: Pain on moving left hip.  Has good pulses. Skin: Chronic skin changes. Neurologic: Alert awake oriented time place and person.  Moves all extremities. Psychiatric: Appears normal.  Normal affect.   Labs on Admission: I have personally reviewed following labs and imaging studies  CBC: Recent Labs  Lab 08/18/21 1320  WBC 12.0*  NEUTROABS 10.8*  HGB 9.6*  HCT 30.7*  MCV 100.3*  PLT 161   Basic Metabolic Panel: Recent Labs  Lab 08/18/21 1320  NA 140  K 4.3  CL 99  CO2 30  GLUCOSE 75  BUN 33*  CREATININE 0.90  CALCIUM 9.2   GFR: Estimated Creatinine Clearance: 62.3 mL/min (by C-G formula based on SCr of 0.9 mg/dL). Liver Function Tests: Recent Labs  Lab 08/18/21 1320  AST 41  ALT 19  ALKPHOS 73  BILITOT 1.1  PROT 8.1  ALBUMIN 4.2   No results for input(s): LIPASE, AMYLASE in the last 168 hours. No results for input(s): AMMONIA in the last 168 hours. Coagulation Profile: No results for input(s): INR, PROTIME in the last 168 hours. Cardiac Enzymes: No results for input(s): CKTOTAL, CKMB, CKMBINDEX, TROPONINI in the last 168 hours. BNP (last 3 results) Recent Labs    09/20/20 1058  PROBNP 90.0   HbA1C: No results for input(s): HGBA1C in the last 72 hours. CBG: No results for input(s): GLUCAP in the last 168 hours. Lipid  Profile: No results for input(s): CHOL, HDL, LDLCALC, TRIG, CHOLHDL, LDLDIRECT  in the last 72 hours. Thyroid Function Tests: No results for input(s): TSH, T4TOTAL, FREET4, T3FREE, THYROIDAB in the last 72 hours. Anemia Panel: No results for input(s): VITAMINB12, FOLATE, FERRITIN, TIBC, IRON, RETICCTPCT in the last 72 hours. Urine analysis:    Component Value Date/Time   COLORURINE YELLOW (A) 01/22/2019 1212   APPEARANCEUR CLEAR (A) 01/22/2019 1212   APPEARANCEUR Clear 03/05/2018 1420   LABSPEC 1.017 01/22/2019 1212   PHURINE 6.0 01/22/2019 1212   GLUCOSEU NEGATIVE 01/22/2019 1212   HGBUR NEGATIVE 01/22/2019 1212   BILIRUBINUR negative 05/04/2020 1701   BILIRUBINUR Negative 03/05/2018 1420   KETONESUR NEGATIVE 01/22/2019 1212   PROTEINUR Positive (A) 05/04/2020 1701   PROTEINUR NEGATIVE 01/22/2019 1212   UROBILINOGEN 0.2 05/04/2020 1701   NITRITE negative 05/04/2020 1701   NITRITE NEGATIVE 01/22/2019 1212   LEUKOCYTESUR Negative 05/04/2020 1701   LEUKOCYTESUR NEGATIVE 01/22/2019 1212   Sepsis Labs: @LABRCNTIP (procalcitonin:4,lacticidven:4) ) Recent Results (from the past 240 hour(s))  Resp Panel by RT-PCR (Flu A&B, Covid) Nasopharyngeal Swab     Status: None   Collection Time: 08/18/21  1:23 PM   Specimen: Nasopharyngeal Swab; Nasopharyngeal(NP) swabs in vial transport medium  Result Value Ref Range Status   SARS Coronavirus 2 by RT PCR NEGATIVE NEGATIVE Final    Comment: (NOTE) SARS-CoV-2 target nucleic acids are NOT DETECTED.  The SARS-CoV-2 RNA is generally detectable in upper respiratory specimens during the acute phase of infection. The lowest concentration of SARS-CoV-2 viral copies this assay can detect is 138 copies/mL. A negative result does not preclude SARS-Cov-2 infection and should not be used as the sole basis for treatment or other patient management decisions. A negative result may occur with  improper specimen collection/handling, submission of specimen  other than nasopharyngeal swab, presence of viral mutation(s) within the areas targeted by this assay, and inadequate number of viral copies(<138 copies/mL). A negative result must be combined with clinical observations, patient history, and epidemiological information. The expected result is Negative.  Fact Sheet for Patients:  EntrepreneurPulse.com.au  Fact Sheet for Healthcare Providers:  IncredibleEmployment.be  This test is no t yet approved or cleared by the [place] FDA and  has been authorized for detection and/or diagnosis of SARS-CoV-2 by FDA under an Emergency Use Authorization (EUA). This EUA will remain  in effect (meaning this test can be used) for the duration of the COVID-19 declaration under Section 564(b)(1) of the Act, 21 U.S.C.section 360bbb-3(b)(1), unless the authorization is terminated  or revoked sooner.       Influenza A by PCR NEGATIVE NEGATIVE Final   Influenza B by PCR NEGATIVE NEGATIVE Final    Comment: (NOTE) The Xpert Xpress SARS-CoV-2/FLU/RSV plus assay is intended as an aid in the diagnosis of influenza from Nasopharyngeal swab specimens and should not be used as a sole basis for treatment. Nasal washings and aspirates are unacceptable for Xpert Xpress SARS-CoV-2/FLU/RSV testing.  Fact Sheet for Patients: EntrepreneurPulse.com.au  Fact Sheet for Healthcare Providers: IncredibleEmployment.be  This test is not yet approved or cleared by the [place] FDA and has been authorized for detection and/or diagnosis of SARS-CoV-2 by FDA under an Emergency Use Authorization (EUA). This EUA will remain in effect (meaning this test can be used) for the duration of the COVID-19 declaration under Section 564(b)(1) of the Act, 21 U.S.C. section 360bbb-3(b)(1), unless the authorization is terminated or revoked.  Performed at [facility], [address]  [place], [place] [address]      Radiological Exams on Admission: DG  Chest Port 1 View  Result Date: 08/18/2021 CLINICAL DATA:  Mechanical fall, weakness EXAM: PORTABLE CHEST 1 VIEW COMPARISON:  Radiograph 05/02/2020, chest CT 01/05/2021 FINDINGS: Right medial mid lung consolidation similar to prior chest CT. Stable right apical opacity with adjacent clips. No new airspace disease. No pleural effusion. No pneumothorax. No acute osseous abnormality. No acute osseous abnormality. IMPRESSION: Stable exam with similar right medial mid lung consolidation in comparison to prior chest CT in July 2022. Unchanged right apical opacity with adjacent clips. No new airspace disease. Electronically Signed   By: Maurine Simmering M.D.   On: 08/18/2021 13:50   DG Hip Unilat With Pelvis 2-3 Views Left  Result Date: 08/18/2021 CLINICAL DATA:  Fall last night EXAM: DG HIP (WITH OR WITHOUT PELVIS) 2-3V LEFT COMPARISON:  None. FINDINGS: There is a mildly displaced fracture of the left femoral neck with proximal displacement of the distal fragment. Femoroacetabular alignment is maintained. There is no other fracture. Alignment is maintained on the right. The SI joints and symphysis pubis are intact. IMPRESSION: Mildly displaced left femoral neck fracture. Electronically Signed   By: Valetta Mole M.D.   On: 08/18/2021 13:48    EKG: Independently reviewed.  Sinus rhythm with RBBB.  Assessment/Plan Principal Problem:   Hip fracture (HCC) Active Problems:   HTN (hypertension)   Benign essential tremor   COPD, severe (HCC)   Chronic respiratory failure with hypoxia (HCC)    Left hip fracture after mechanical fall -Dr. Gaspar Bidding on-call orthopedic surgeon has been consulted.  Plan is to have surgery tomorrow.  We will keep patient n.p.o. past midnight. Chronic hypoxic respiratory failure secondary to severe COPD usually uses 5 L oxygen at home.  Presently not wheezing.  X-ray does not show any acute.  Continue home inhalers.    Hypertension uncontrolled likely from pain.  We will continue lisinopril and beta-blockers.  Follow blood pressure trends.  As needed IV hydralazine has been ordered for systolic more than 462. Chronic macrocytic anemia secondary to B12 deficiency.  Hemoglobin has dropped from baseline.  Follow CBC Will check anemia panel continue B12 supplements. History of benign essential tremor presently not on any medications. History of neck arthritis uses brace. History of lung and bladder cancer in remission.  Chest x-ray does show persistent density.  Since patient has hip fracture and will undergo likely surgery will need close monitoring and inpatient status.   DVT prophylaxis: SCD.  Start pharmacological DVT prophylaxis once okay with orthopedics. Code Status: Full code. Family Communication: Patient's wife at the bedside. Disposition Plan: May need rehab. Consults called: Orthopedics. Admission status: Inpatient.   Rise Patience MD [facility] Pager [phone].  If 7PM-7AM, please contact night-coverage [email or web address] Password [facility]  08/18/2021, 3:17 PM

## 2021-08-18 NOTE — Plan of Care (Signed)

## 2021-08-18 NOTE — Consult Note (Signed)
ORTHOPAEDIC CONSULTATION  PATIENT NAME: Joseph Graham DOB: May 21, 1941  MRN: [id number]  REQUESTING PHYSICIAN: Rise Patience, MD  Chief Complaint: Left hip pain  HPI: Joseph Graham is a 81 y.o. male who complains of  left hip pain after mechanical GLF yesterday. Pt is ambulator with walker assist secondary to balance issues from chemo induced peripheral neuropathy. Pt was able to get to a chair and sleep through the night, but unable to ambulate this morning and came to ED. No new numbness/tingling. Pt has history of lung and bladder cancer. No antecedent hip pain.   Past Medical History:  Diagnosis Date   Alcohol abuse, in remission    remote    Arthritis    BACK AND NECK   Benign essential tremor    improved on metoprolol and gabapentin   BPH (benign prostatic hypertrophy) 12/30/2012   Cardiac arrhythmia 03/2010   h/o a flutter and CM, s/p ablation, normal stress test   Cataract    bilat removed    Chemotherapy-induced neutropenia ([place]) 09/23/2015   Chronic low back pain    MRI 2004, multilevel DDD   Colon polyps    next colonoscopy due around 1962   Complication of anesthesia age [age]   DID NOT GET COMPLETELY NUMB WITH TONSILLECTOMY   COPD (chronic obstructive pulmonary disease) ([place])    Coronary artery disease    Diabetes mellitus without complication ([place])    Pt states his A1C is 6.7 and is diet controlled.    Dyslipidemia    mild off meds   GERD (gastroesophageal reflux disease)    History of kidney stones    h/o   HTN (hypertension)    Macular degeneration, bilateral    Multiple pulmonary nodules 02/2015   Neuromuscular disorder (HCC)    neuropathy from chemo....in feet   Peripheral neuropathy due to chemotherapy ([place]) 12/23/2015   Saw Dr Manuella Ghazi, on gabapentin and cymbalta with hydrocodone PRN (03/2016)   Personal history of tobacco use, presenting hazards to health 02/18/2015   quit 06/2011, restarted 2014   Pneumothorax after biopsy 04/01/2015    Pre-diabetes    Primary cancer of bladder ([place]) 2016   Budzyn   Primary lung cancer ([place]) 2016   port a cath removed 03/2016   Shortness of breath dyspnea    OCC WITH EXERTION   Past Surgical History:  Procedure Laterality Date   A FLUTTER ABLATION  03/2010   carotid US  03/2010   WNL   CATARACT EXTRACTION     COLONOSCOPY  09/2011   polyps, rpt due 5 yrs, mild diverticulosis [name])   CYSTOSCOPY W/ RETROGRADES Bilateral 04/06/2015   Procedure: CYSTOSCOPY WITH RETROGRADE PYELOGRAM;  Surgeon: [name], MD;  Location: [facility];  Service: Urology;  Laterality: Bilateral;   ELECTROMAGNETIC NAVIGATION BROCHOSCOPY N/A 03/02/2015   Procedure: ELECTROMAGNETIC NAVIGATION BRONCHOSCOPY;  Surgeon: [name], MD;  Location: [facility];  Service: Cardiopulmonary;  Laterality: N/A;   HEMI-MICRODISCECTOMY LUMBAR LAMINECTOMY LEVEL 1 N/A 01/26/2019   LEFT L5-S1 FAR 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 [name], [name], MD)   [place]     collapsed lung with chest tube   POLYPECTOMY     PORT-A-CATH REMOVAL Right 04/03/2016   Procedure: REMOVAL PORT-A-CATH;  Surgeon: [name], MD;  Location: [facility];  Service: General;  Laterality: Right;   PORTACATH PLACEMENT N/A 08/10/2015   Procedure: INSERTION PORT-A-CATH;  Surgeon: [name], MD;  Location: [facility];  Service: General;  Laterality: N/A;   TONSILLECTOMY  1964   TRANSURETHRAL RESECTION OF BLADDER TUMOR N/A 04/06/2015   Procedure: TRANSURETHRAL RESECTION OF BLADDER TUMOR (TURBT) right ureteral stent placement;  Surgeon: [name], MD;  Location: [facility];  Service: Urology;  Laterality: N/A;   TRANSURETHRAL RESECTION OF BLADDER TUMOR N/A 07/13/2015   Procedure: TRANSURETHRAL RESECTION OF BLADDER TUMOR (TURBT);  Surgeon: [name], MD;  Location: [facility];  Service: Urology;  Laterality: N/A;   urinary stent removal  04/14/15   VIDEO ASSISTED THORACOSCOPY  (VATS) W/TALC PLEUADESIS Right 04/27/2020   Procedure: VIDEO ASSISTED THORACOSCOPY (VATS) W/TALC PLEUADESIS;  Surgeon: [name], MD;  Location: [facility];  Service: Thoracic;  Laterality: Right;   Social History   Socioeconomic History   Marital status: Married    Spouse name: Not on file   Number of children: Not on file   Years of education: Not on file   Highest education level: Not on file  Occupational History   Not on file  Tobacco Use   Smoking status: Former    Packs/day: 1.50    Years: 61.00    Pack years: 91.50    Types: Cigarettes    Quit date: 03/08/2015    Years since quitting: 6.4   Smokeless tobacco: Never   Tobacco comments:    cut back 0.5 cigarettes--stopped 03/07/15; now now chantix  Vaping Use   Vaping Use: Every day   Substances: Nicotine  Substance and Sexual Activity   Alcohol use: No    Alcohol/week: 0.0 standard drinks    Comment: has not drank in 37 years   Drug use: No   Sexual activity: Never  Other Topics Concern   Not on file  Social History Narrative   Caffeine: 1 cup decaf/day   Lives with wife   Occupation: retired, worked Press photographer   Activity: gym 3x/wk, Immunologist, hobbies (paint, building things)   Diet: healthy, good water, fruits/vegetables daily, red meat 1x/wk   Social Determinants of Radio broadcast assistant Strain: Low Risk    Difficulty of Paying Living Expenses: Not hard at all  Food Insecurity: Not on file  Transportation Needs: Not on file  Physical Activity: Not on file  Stress: Not on file  Social Connections: Not on file   Family History  Problem Relation Age of Onset   Cervical cancer Mother 79   Hypertension Father    Tremor Father        and several uncles/aunts (not parkinson's)   Stroke Brother 26   Bladder Cancer Brother 78   Prostate cancer Neg Hx    Kidney cancer Neg Hx    Colon cancer Neg Hx    Colon polyps Neg Hx    Esophageal cancer Neg Hx    Rectal cancer Neg Hx    No Active  Allergies Prior to Admission medications   Medication Sig Start Date End Date Taking? Authorizing Provider  ASPIRIN 81 PO Take 1 tablet by mouth daily.   Yes [provider]  Fluticasone-Umeclidin-Vilant (TRELEGY ELLIPTA) 100-62.5-25 MCG/ACT AEPB Inhale 1 puff into the lungs daily. 04/26/21  Yes [name], MD  HYDROcodone-acetaminophen (NORCO) 7.5-325 MG tablet Take 1 tablet by mouth every 4 (four) hours as needed for moderate pain. 08/15/21  Yes Borders, Kirt Boys, NP  lisinopril (ZESTRIL) 10 MG tablet Take 1 tablet (10 mg total) by mouth every evening. 07/27/21  Yes [name], MD  metoprolol tartrate (LOPRESSOR) 25 MG tablet Take 1  tablet (25 mg total) by mouth daily. 07/27/21  Yes [name], MD  Multiple Vitamin (MULTIVITAMIN WITH MINERALS) TABS tablet Take 1 tablet by mouth every evening.   Yes [provider]  NON FORMULARY Take 500 mg by mouth daily. Hemp Oil 500 mg   Yes [provider]  omeprazole (PRILOSEC) 20 MG capsule Take 20 mg by mouth every morning.    Yes [provider]  oxymetazoline (AFRIN) 0.05 % nasal spray Place 1 spray into both nostrils 2 (two) times daily. As needed   Yes [provider]  vitamin B-12 (CYANOCOBALAMIN) 100 MCG tablet Take 100 mcg by mouth every evening.   Yes [provider]  vitamin E 400 UNIT capsule Take 400 Units by mouth every evening.    Yes [provider]  albuterol (VENTOLIN HFA) 108 (90 Base) MCG/ACT inhaler Inhale 2 puffs into the lungs every 6 (six) hours as needed for wheezing or shortness of breath. 04/24/21   [name], MD  azelastine (ASTELIN) 0.1 % nasal spray Place 1 spray into both nostrils 2 (two) times daily. Use in each nostril as directed Patient not taking: Reported on 08/18/2021 07/18/20   [name], MD  furosemide (LASIX) 40 MG tablet Take 1 tablet (40 mg total) by mouth every other day. 08/16/21   [name], MD  gabapentin  (NEURONTIN) 100 MG capsule Take 1 capsule (100 mg total) by mouth daily as needed. 01/18/21   [name], MD  naloxone [facility]) nasal spray 4 mg/0.1 mL SPRAY 1 SPRAY INTO ONE NOSTRIL AS DIRECTED FOR OPIOID OVERDOSE (TURN PERSON ON SIDE AFTER DOSE. IF NO RESPONSE IN 2-3 MINUTES OR PERSON RESPONDS BUT RELAPSES, REPEAT USING A NEW SPRAY DEVICE AND SPRAY INTO THE OTHER NOSTRIL. CALL 911 AFTER USE.) * EMERGENCY USE ONLY * 10/05/20   Borders, Kirt Boys, NP  OXYGEN Inhale 2 L into the lungs daily. Using 4 L    [provider]  polyethylene glycol (MIRALAX / GLYCOLAX) packet Take 17 g by mouth daily.    [provider]  potassium chloride (KLOR-CON) 10 MEQ tablet Take 1 tablet (10 mEq total) by mouth every other day. 08/16/21   [name], MD   DG Chest Port 1 View  Result Date: 08/18/2021 CLINICAL DATA:  Mechanical fall, weakness EXAM: PORTABLE CHEST 1 VIEW COMPARISON:  Radiograph 05/02/2020, chest CT 01/05/2021 FINDINGS: Right medial mid lung consolidation similar to prior chest CT. Stable right apical opacity with adjacent clips. No new airspace disease. No pleural effusion. No pneumothorax. No acute osseous abnormality. No acute osseous abnormality. IMPRESSION: Stable exam with similar right medial mid lung consolidation in comparison to prior chest CT in July 2022. Unchanged right apical opacity with adjacent clips. No new airspace disease. Electronically Signed   By: Maurine Simmering M.D.   On: 08/18/2021 13:50   DG Hip Unilat With Pelvis 2-3 Views Left  Result Date: 08/18/2021 CLINICAL DATA:  Fall last night EXAM: DG HIP (WITH OR WITHOUT PELVIS) 2-3V LEFT COMPARISON:  None. FINDINGS: There is a mildly displaced fracture of the left femoral neck with proximal displacement of the distal fragment. Femoroacetabular alignment is maintained. There is no other fracture. Alignment is maintained on the right. The SI joints and symphysis pubis are intact. IMPRESSION: Mildly displaced left  femoral neck fracture. Electronically Signed   By: Valetta Mole M.D.   On: 08/18/2021 13:48    No evidence of bone lesion around fracture site.   Positive ROS: All other systems  have been reviewed and were otherwise negative with the exception of those mentioned in the HPI and as above.  Physical Exam: General: Well developed, well nourished male seen in no acute distress. HEENT: Atraumatic and normocephalic. Sclera are clear. Extraocular motion is intact. Oropharynx is clear with moist mucosa. Neck: Supple, nontender, good range of motion. No JVD or carotid bruits. Lungs: Clear to auscultation bilaterally. Cardiovascular: Regular rate and rhythm with normal S1 and S2. No murmurs. No gallops or rubs. Pedal pulses are palpable bilaterally. Homans test is negative bilaterally. No significant pretibial or ankle edema. Abdomen: Soft, nontender, and nondistended. Bowel sounds are present. Skin: No lesions in the area of chief complaint Neurologic: Awake, alert, and oriented. Sensory function is grossly intact. Motor strength is felt to be 5 over 5 bilaterally. No clonus or tremor. Good motor coordination. Lymphatic: No axillary or cervical lymphadenopathy  MUSCULOSKELETAL: L hip- skin intact, obvious deformity. ROM and stability not tested. SILT SPN/DPN/T nerves. Motor intact TA/EHL/GSC. Well perfused foot.   Assessment: Left displaced femoral neck fracture.  Plan: Discussed r/b/a of operative and nonoperative management. Discussed risks of nonoperative management being complications of bed rest to include, but not limited to VTE, bed sores, pneumonia. Discussed that operative management has the goal of mitigating those risks by improving mobilization, but may not to be able to completely eliminate them. Discussed that operative management has the risks of, but not limited to, bleeding, infection, fracture, damage to surrounding nerves/blood vessels/cartilage/ligaments/tendsons, leg length  discrepancy, dislocation, hardware complications, continued pain, and potential for revision surgery. Further, it comes with the medical complications of anesthesia. We discussed that I am covering call in a locums capacity for Dr. Harlow Mares, and this may be the only time meeting the patient. The patient is comfortable with this relationship and would like to proceed with operative intervention once optimized by the hospitalist service and deemed at an acceptable risk from the anesthesia provider. -Plan for left hip hemi arthroplasty tomorrow AM.  -TXA on chart -Pre-op abx -Discussed with patient he will likely require blood transfusion with low starting H/H      Ladeidra Borys E. Nayson Traweek M.D.

## 2021-08-19 ENCOUNTER — Inpatient Hospital Stay: Payer: Medicare Other | Admitting: Certified Registered"

## 2021-08-19 ENCOUNTER — Encounter
Admission: EM | Disposition: A | Discharge status: Skilled Nursing Facility | Payer: Self-pay | Source: Home / Self Care | Attending: Internal Medicine

## 2021-08-19 ENCOUNTER — Other Ambulatory Visit: Payer: Self-pay

## 2021-08-19 ENCOUNTER — Inpatient Hospital Stay: Payer: Medicare Other

## 2021-08-19 DIAGNOSIS — I10 Essential (primary) hypertension: Secondary | ICD-10-CM

## 2021-08-19 DIAGNOSIS — J9611 Chronic respiratory failure with hypoxia: Secondary | ICD-10-CM

## 2021-08-19 DIAGNOSIS — S72002A Fracture of unspecified part of neck of left femur, initial encounter for closed fracture: Secondary | ICD-10-CM

## 2021-08-19 DIAGNOSIS — G250 Essential tremor: Secondary | ICD-10-CM

## 2021-08-19 HISTORY — PX: HIP ARTHROPLASTY: SHX981

## 2021-08-19 LAB — CBC
HCT: 30.3 % — ABNORMAL LOW (ref 39.0–52.0)
Hemoglobin: 9.7 g/dL — ABNORMAL LOW (ref 13.0–17.0)
MCH: 31.1 pg (ref 26.0–34.0)
MCHC: 32 g/dL (ref 30.0–36.0)
MCV: 97.1 fL (ref 80.0–100.0)
Platelets: 224 10*3/uL (ref 150–400)
RBC: 3.12 MIL/uL — ABNORMAL LOW (ref 4.22–5.81)
RDW: 14.4 % (ref 11.5–15.5)
WBC: 11.9 10*3/uL — ABNORMAL HIGH (ref 4.0–10.5)
nRBC: 0 % (ref 0.0–0.2)

## 2021-08-19 LAB — BASIC METABOLIC PANEL
Anion gap: 11 (ref 5–15)
BUN: 27 mg/dL — ABNORMAL HIGH (ref 8–23)
CO2: 28 mmol/L (ref 22–32)
Calcium: 9 mg/dL (ref 8.9–10.3)
Chloride: 99 mmol/L (ref 98–111)
Creatinine, Ser: 0.69 mg/dL (ref 0.61–1.24)
GFR, Estimated: >60 mL/min (ref >60)
Glucose, Bld: 97 mg/dL (ref 70–99)
Potassium: 3.9 mmol/L (ref 3.5–5.1)
Sodium: 138 mmol/L (ref 135–145)

## 2021-08-19 LAB — GLUCOSE, CAPILLARY
Glucose-Capillary: 112 mg/dL — ABNORMAL HIGH (ref 70–99)
Glucose-Capillary: 118 mg/dL — ABNORMAL HIGH (ref 70–99)
Glucose-Capillary: 139 mg/dL — ABNORMAL HIGH (ref 70–99)
Glucose-Capillary: 146 mg/dL — ABNORMAL HIGH (ref 70–99)

## 2021-08-19 SURGERY — HEMIARTHROPLASTY, HIP, DIRECT ANTERIOR APPROACH, FOR FRACTURE
Anesthesia: Spinal | Site: Hip | Laterality: Left

## 2021-08-19 MED ORDER — CHLORHEXIDINE GLUCONATE CLOTH 2 % EX PADS
6.0000 | MEDICATED_PAD | Freq: Every day | CUTANEOUS | Status: DC
  Administered 2021-08-19 – 2021-08-22 (×4): 6 via TOPICAL

## 2021-08-19 MED ORDER — HYDROCODONE-ACETAMINOPHEN 7.5-325 MG PO TABS
1.0000 | ORAL_TABLET | ORAL | Status: DC | PRN
  Administered 2021-08-19 (×2): 2 via ORAL
  Administered 2021-08-19: 1 via ORAL
  Administered 2021-08-20 – 2021-08-21 (×4): 2 via ORAL
  Administered 2021-08-22: 1 via ORAL
  Administered 2021-08-22: 2 via ORAL
  Filled 2021-08-19 (×3): qty 2
  Filled 2021-08-19 (×2): qty 1
  Filled 2021-08-19 (×4): qty 2

## 2021-08-19 MED ORDER — LACTATED RINGERS IV SOLN: INTRAVENOUS | Status: DC | PRN

## 2021-08-19 MED ORDER — ALBUTEROL SULFATE (2.5 MG/3ML) 0.083% IN NEBU: 3.0000 mL | INHALATION_SOLUTION | Freq: Four times a day (QID) | RESPIRATORY_TRACT | Status: DC | PRN

## 2021-08-19 MED ORDER — HYDRALAZINE HCL 20 MG/ML IJ SOLN
INTRAMUSCULAR | Status: AC
  Administered 2021-08-19: 10 mg via INTRAVENOUS
  Filled 2021-08-19: qty 1

## 2021-08-19 MED ORDER — FLUTICASONE FUROATE-VILANTEROL 100-25 MCG/ACT IN AEPB
1.0000 | INHALATION_SPRAY | Freq: Every day | RESPIRATORY_TRACT | Status: DC
  Administered 2021-08-20 – 2021-08-22 (×3): 1 via RESPIRATORY_TRACT
  Filled 2021-08-19: qty 28

## 2021-08-19 MED ORDER — IPRATROPIUM-ALBUTEROL 0.5-2.5 (3) MG/3ML IN SOLN
3.0000 mL | Freq: Once | RESPIRATORY_TRACT | Status: AC
Start: 2021-08-19 — End: 2021-08-19
  Administered 2021-08-19: 3 mL via RESPIRATORY_TRACT

## 2021-08-19 MED ORDER — VANCOMYCIN HCL 1000 MG IV SOLR
INTRAVENOUS | Status: DC | PRN
  Administered 2021-08-19: 1000 mg via TOPICAL

## 2021-08-19 MED ORDER — 0.9 % SODIUM CHLORIDE (POUR BTL) OPTIME
TOPICAL | Status: DC | PRN
  Administered 2021-08-19: 1000 mL

## 2021-08-19 MED ORDER — SODIUM CHLORIDE 0.9 % IR SOLN
Status: DC | PRN
  Administered 2021-08-19: 1500 mL

## 2021-08-19 MED ORDER — LACTATED RINGERS IV SOLN: Freq: Once | INTRAVENOUS | Status: AC

## 2021-08-19 MED ORDER — FENTANYL CITRATE (PF) 100 MCG/2ML IJ SOLN: 25.0000 ug | INTRAMUSCULAR | Status: DC | PRN

## 2021-08-19 MED ORDER — FENTANYL CITRATE (PF) 100 MCG/2ML IJ SOLN
INTRAMUSCULAR | Status: DC | PRN
Start: 2021-08-19 — End: 2021-08-19
  Administered 2021-08-19: 50 ug via INTRAVENOUS

## 2021-08-19 MED ORDER — ONDANSETRON HCL 4 MG/2ML IJ SOLN: 4.0000 mg | Freq: Once | INTRAMUSCULAR | Status: DC | PRN

## 2021-08-19 MED ORDER — LISINOPRIL 10 MG PO TABS
10.0000 mg | ORAL_TABLET | Freq: Every evening | ORAL | Status: DC
  Administered 2021-08-19 – 2021-08-21 (×3): 10 mg via ORAL
  Filled 2021-08-19 (×3): qty 1

## 2021-08-19 MED ORDER — METOPROLOL TARTRATE 25 MG PO TABS
25.0000 mg | ORAL_TABLET | Freq: Every evening | ORAL | Status: DC
  Administered 2021-08-19 – 2021-08-21 (×3): 25 mg via ORAL
  Filled 2021-08-19 (×3): qty 1

## 2021-08-19 MED ORDER — OXYCODONE HCL 5 MG PO TABS: 5.0000 mg | ORAL_TABLET | Freq: Once | ORAL | Status: DC | PRN

## 2021-08-19 MED ORDER — PROPOFOL 500 MG/50ML IV EMUL
INTRAVENOUS | Status: DC | PRN
Start: 2021-08-19 — End: 2021-08-19
  Administered 2021-08-19: 75 ug/kg/min via INTRAVENOUS

## 2021-08-19 MED ORDER — TRANEXAMIC ACID 1000 MG/10ML IV SOLN
INTRAVENOUS | Status: AC
  Filled 2021-08-19: qty 10

## 2021-08-19 MED ORDER — POLYETHYLENE GLYCOL 3350 17 G PO PACK
17.0000 g | PACK | Freq: Every day | ORAL | Status: DC
  Administered 2021-08-19 – 2021-08-22 (×4): 17 g via ORAL
  Filled 2021-08-19 (×4): qty 1

## 2021-08-19 MED ORDER — KETAMINE HCL 50 MG/ML IJ SOLN
INTRAMUSCULAR | Status: DC | PRN
  Administered 2021-08-19 (×2): 20 mg via INTRAMUSCULAR

## 2021-08-19 MED ORDER — UMECLIDINIUM BROMIDE 62.5 MCG/ACT IN AEPB
1.0000 | INHALATION_SPRAY | Freq: Every day | RESPIRATORY_TRACT | Status: DC
  Administered 2021-08-20 – 2021-08-22 (×3): 1 via RESPIRATORY_TRACT
  Filled 2021-08-19: qty 7

## 2021-08-19 MED ORDER — METHOCARBAMOL 500 MG PO TABS: 500.0000 mg | ORAL_TABLET | Freq: Four times a day (QID) | ORAL | Status: DC | PRN

## 2021-08-19 MED ORDER — MIDAZOLAM HCL 5 MG/5ML IJ SOLN
INTRAMUSCULAR | Status: DC | PRN
  Administered 2021-08-19: 1 mg via INTRAVENOUS

## 2021-08-19 MED ORDER — CEFAZOLIN SODIUM-DEXTROSE 2-4 GM/100ML-% IV SOLN
2.0000 g | Freq: Three times a day (TID) | INTRAVENOUS | Status: AC
  Administered 2021-08-19 – 2021-08-20 (×3): 2 g via INTRAVENOUS
  Filled 2021-08-19 (×3): qty 100

## 2021-08-19 MED ORDER — VANCOMYCIN HCL 1000 MG IV SOLR
INTRAVENOUS | Status: AC
  Filled 2021-08-19: qty 20

## 2021-08-19 MED ORDER — BUPIVACAINE HCL (PF) 0.5 % IJ SOLN
INTRAMUSCULAR | Status: DC | PRN
  Administered 2021-08-19: 2.5 mL via INTRATHECAL

## 2021-08-19 MED ORDER — MORPHINE SULFATE (PF) 2 MG/ML IV SOLN: 2.0000 mg | INTRAVENOUS | Status: DC | PRN

## 2021-08-19 MED ORDER — PROPOFOL 10 MG/ML IV BOLUS
INTRAVENOUS | Status: DC | PRN
  Administered 2021-08-19: 30 mg via INTRAVENOUS

## 2021-08-19 MED ORDER — PHENYLEPHRINE HCL-NACL 20-0.9 MG/250ML-% IV SOLN
INTRAVENOUS | Status: DC | PRN
  Administered 2021-08-19 (×2): 70 ug/min via INTRAVENOUS

## 2021-08-19 MED ORDER — PHENYLEPHRINE HCL (PRESSORS) 10 MG/ML IV SOLN
INTRAVENOUS | Status: DC | PRN
  Administered 2021-08-19 (×4): 100 ug via INTRAVENOUS

## 2021-08-19 MED ORDER — HYDROCODONE-ACETAMINOPHEN 7.5-325 MG PO TABS: 1.0000 | ORAL_TABLET | ORAL | Status: DC | PRN

## 2021-08-19 MED ORDER — GABAPENTIN 100 MG PO CAPS
100.0000 mg | ORAL_CAPSULE | Freq: Every day | ORAL | Status: DC | PRN
  Administered 2021-08-19: 100 mg via ORAL
  Filled 2021-08-19: qty 1

## 2021-08-19 MED ORDER — ADULT MULTIVITAMIN W/MINERALS CH: 1.0000 | ORAL_TABLET | Freq: Every evening | ORAL | Status: DC

## 2021-08-19 MED ORDER — HYDRALAZINE HCL 20 MG/ML IJ SOLN
10.0000 mg | INTRAMUSCULAR | Status: AC | PRN
  Administered 2021-08-19: 10 mg via INTRAVENOUS

## 2021-08-19 MED ORDER — PANTOPRAZOLE SODIUM 40 MG PO TBEC
40.0000 mg | DELAYED_RELEASE_TABLET | Freq: Every day | ORAL | Status: DC
  Administered 2021-08-19 – 2021-08-22 (×4): 40 mg via ORAL
  Filled 2021-08-19 (×4): qty 1

## 2021-08-19 MED ORDER — ACETAMINOPHEN 10 MG/ML IV SOLN: 1000.0000 mg | Freq: Once | INTRAVENOUS | Status: DC | PRN

## 2021-08-19 MED ORDER — FENTANYL CITRATE (PF) 100 MCG/2ML IJ SOLN
INTRAMUSCULAR | Status: AC
  Administered 2021-08-19: 25 ug via INTRAVENOUS
  Filled 2021-08-19: qty 2

## 2021-08-19 MED ORDER — OXYCODONE HCL 5 MG/5ML PO SOLN: 5.0000 mg | Freq: Once | ORAL | Status: DC | PRN

## 2021-08-19 MED ORDER — LIDOCAINE HCL (CARDIAC) PF 100 MG/5ML IV SOSY
PREFILLED_SYRINGE | INTRAVENOUS | Status: DC | PRN
Start: 2021-08-19 — End: 2021-08-19
  Administered 2021-08-19: 50 mg via INTRAVENOUS

## 2021-08-19 SURGICAL SUPPLY — 43 items
BLADE SAW SAG 25X90X1.19 (BLADE) ×2 IMPLANT
BLADE SURG SZ10 CARB STEEL (BLADE) ×3 IMPLANT
CATH COUDE FOLEY 5CC 14FR (CATHETERS) ×2 IMPLANT
CHLORAPREP W/TINT 26 (MISCELLANEOUS) ×6 IMPLANT
COVER BACK TABLE REUSABLE LG (DRAPES) ×3 IMPLANT
DRAPE INCISE IOBAN 66X60 STRL (DRAPES) ×6 IMPLANT
DRSG AQUACEL AG ADV 3.5X10 (GAUZE/BANDAGES/DRESSINGS) ×3 IMPLANT
ELECT CAUTERY BLADE 6.4 (BLADE) ×3 IMPLANT
ELECT REM PT RETURN 9FT ADLT (ELECTROSURGICAL) ×3
ELECTRODE REM PT RTRN 9FT ADLT (ELECTROSURGICAL) ×1 IMPLANT
GAUZE SPONGE 4X4 12PLY STRL (GAUZE/BANDAGES/DRESSINGS) ×3 IMPLANT
GLOVE SURG ORTHO LTX SZ8.5 (GLOVE) ×3 IMPLANT
GLOVE SURG UNDER LTX SZ8 (GLOVE) ×3 IMPLANT
GOWN STRL REUS W/ TWL LRG LVL3 (GOWN DISPOSABLE) ×2 IMPLANT
GOWN STRL REUS W/TWL LRG LVL3 (GOWN DISPOSABLE) ×4
GOWN STRL REUS W/TWL LRG LVL4 (GOWN DISPOSABLE) ×3 IMPLANT
HEAD MODULAR ENDO (Orthopedic Implant) ×2 IMPLANT
HEAD UNPLR 45XMDLR STRL HIP (Orthopedic Implant) IMPLANT
HOLSTER ELECTROSUGICAL PENCIL (MISCELLANEOUS) ×3 IMPLANT
KIT TURNOVER KIT A (KITS) ×3 IMPLANT
MANIFOLD NEPTUNE II (INSTRUMENTS) ×3 IMPLANT
NDL MAYO CATGUT SZ4 TPR NDL (NEEDLE) ×1 IMPLANT
NEEDLE MAYO CATGUT SZ4 (NEEDLE) ×3 IMPLANT
NS IRRIG 1000ML POUR BTL (IV SOLUTION) ×3 IMPLANT
PACK HIP PROSTHESIS (MISCELLANEOUS) ×3 IMPLANT
PILLOW ABDUCTION MEDIUM (MISCELLANEOUS) ×2 IMPLANT
PULSAVAC PLUS IRRIG FAN TIP (DISPOSABLE) ×3
RETRIEVER SUT HEWSON (MISCELLANEOUS) ×2 IMPLANT
SLEEVE UNITRAX V40 STD (Orthopedic Implant) ×2 IMPLANT
SPONGE T-LAP 18X18 ~~LOC~~+RFID (SPONGE) ×12 IMPLANT
STAPLER SKIN PROX 35W (STAPLE) ×3 IMPLANT
STEM ACCOLADE SZ 6 (Hips) ×2 IMPLANT
SUT DVC 2 QUILL PDO  T11 36X36 (SUTURE) ×2
SUT DVC 2 QUILL PDO T11 36X36 (SUTURE) ×2 IMPLANT
SUT MON AB 2-0 CT1 36 (SUTURE) ×2 IMPLANT
SUT QUILL PDO 0 36 36 VIOLET (SUTURE) ×3 IMPLANT
SYR 10ML LL (SYRINGE) ×3 IMPLANT
SYR 30ML LL (SYRINGE) ×3 IMPLANT
SYR 50ML LL SCALE MARK (SYRINGE) ×3 IMPLANT
TIP FAN IRRIG PULSAVAC PLUS (DISPOSABLE) ×1 IMPLANT
TRAY FOL W/BAG SLVR 16FR STRL (SET/KITS/TRAYS/PACK) IMPLANT
TRAY FOLEY W/BAG SLVR 16FR LF (SET/KITS/TRAYS/PACK) ×2
WATER STERILE IRR 1000ML POUR (IV SOLUTION) ×3 IMPLANT

## 2021-08-19 NOTE — Transfer of Care (Signed)
Immediate Anesthesia Transfer of Care Note ? ?Patient: Joseph Graham ? ?Procedure(s) Performed: ARTHROPLASTY BIPOLAR HIP (HEMIARTHROPLASTY) (Left: Hip) ? ?Patient Location: PACU ? ?Anesthesia Type:Spinal ? ?Level of Consciousness: drowsy and patient cooperative ? ?Airway & Oxygen Therapy: Patient Spontanous Breathing and Patient connected to face mask ? ?Post-op Assessment: Report given to RN and Post -op Vital signs reviewed and stable ? ?Post vital signs: Reviewed and stable ? ?Last Vitals:  ?Vitals Value Taken Time  ?BP    ?Temp    ?Pulse    ?Resp    ?SpO2    ? ? ?Last Pain:  ?Vitals:  ? 08/19/21 0619  ?TempSrc:   ?PainSc: Asleep  ?   ? ?  ? ?Complications: No notable events documented. ?

## 2021-08-19 NOTE — Progress Notes (Signed)
OT Cancellation Note ? ?Patient Details ?Name: Joseph Graham ?MRN: [id number] ?DOB: June 27, 1940 ? ? ?Cancelled Treatment:    Reason Eval/Treat Not Completed: Patient at procedure or test/ unavailable;Medical issues which prohibited therapy. Order received. Chart reviewed. Pt currently OTF for sx. Will complete OT orders at this time. Please re-consult once pt medically appropriate for OT evaluation. Thank you.  ? ?Shara Blazing, M.S., OTR/L ?[facility] ?Ascom: [phone] ?08/19/21, 8:01 AM ? ? ?Kaijah Abts Roosvelt Maser ?08/19/2021, 7:58 AM ?

## 2021-08-19 NOTE — Progress Notes (Signed)
PT Cancellation Note ? ?Patient Details ?Name: Joseph Graham ?MRN: [id number] ?DOB: [birth year] ? ? ?Cancelled Treatment:    Reason Eval/Treat Not Completed: Patient at procedure or test/unavailable; Medical issues which prohibited therapy. Order received. Chart reviewed. Pt currently OTF for sx. Will complete PT orders at this time. Please re-consult once pt medically appropriate for PT evaluation. Thank you.  ? ? ?Iva Boop, PT  ?08/19/21. 8:04 AM ?

## 2021-08-19 NOTE — Anesthesia Procedure Notes (Signed)
Spinal ? ?Patient location during procedure: OR ?Start time: 08/19/2021 7:40 AM ?End time: 08/19/2021 7:53 AM ?Reason for block: surgical anesthesia ?Staffing ?Performed: anesthesiologist  ?Anesthesiologist: Arita Miss, MD ?Preanesthetic Checklist ?Completed: patient identified, IV checked, site marked, risks and benefits discussed, surgical consent, monitors and equipment checked, pre-op evaluation and timeout performed ?Spinal Block ?Patient position: right lateral decubitus ?Prep: ChloraPrep and site prepped and draped ?Patient monitoring: heart rate, continuous pulse ox, blood pressure and cardiac monitor ?Approach: midline ?Location: L3-4 ?Injection technique: single-shot ?Needle ?Needle type: Quincke  ?Needle gauge: 22 G ?Needle length: 9 cm ?Assessment ?Sensory level: T10 ?Events: CSF return ?Additional Notes ?Meticulous sterile technique used throughout (CHG prep, sterile gloves, sterile drape). Negative paresthesia. Negative blood return. Positive free-flowing CSF. Expiration date of kit checked and confirmed. Patient tolerated procedure well, without complications. ? ? ? ?

## 2021-08-19 NOTE — Consult Note (Signed)
H&P reviewed. No change. Consent verified and r/b/a re-discussed. Plan for left hemi hip arthroplasty.  ? ?Ted Mcalpine. Kallum Jorgensen, MD ?

## 2021-08-19 NOTE — OR Nursing (Signed)
During post-op xray pt stated he was hurting and was informed that this RN could give him pain medicine. Pt grimacing with movement during xray. This RN began administering pain medicine and pt began yelling that he doesn't want any pain medicine. 44mcg of fentanyl were given prior to pt telling this RN to stop. Pt BP has been elevated post-op. This RN attempted to obtain BP to verifiy elevated BP but pt would not keep arm still and began ripping BP cuff off stating "Just give me 10 minutes. I'm going to stand on my own 2 feet." Pt educated about spinal medicine and that PT has to work with him. Pt agreed to have BP taken once. BP was elevated. BP meds given. Pt stating he needs to have BM. Pt offered bedpan and refused multiple times. Pt wants to walk or be wheeled to bathroom. Pt informed again about spinal medicine and that he can't be placed on toilet at this time. Pt voicing frustration to this RN about bedpan.  ?

## 2021-08-19 NOTE — Anesthesia Preprocedure Evaluation (Signed)
Anesthesia Evaluation  ?Patient identified by MRN, date of birth, ID band ?Patient awake ? ?General Assessment Comment: ? ?On 2L [place] normally, sometimes goes up to 5L/min when he has to use "70 feet of tubing when I'm upstairs in my mancave" ? ?Reviewed: ?Allergy & Precautions, NPO status , Patient's Chart, lab work & pertinent test results ? ?History of Anesthesia Complications ?Negative for: history of anesthetic complications ? ?Airway ?Mallampati: II ? ?TM Distance: >3 FB ?Neck ROM: Full ? ? ? Dental ?no notable dental hx. ?(+) Teeth Intact ?  ?Pulmonary ?shortness of breath and at rest, neg sleep apnea, COPD,  COPD inhaler and oxygen dependent, Patient abstained from smoking.Not current smoker, former smoker,  ?2L  at home ? ?Recurrent R pneumothorax. Has chest tube in place. Breathing today feels a lot better than when he came into the hospital ?  ? ?+ decreased breath sounds ? ? ? ? ? Cardiovascular ?Exercise Tolerance: Poor ?METS: 3 - Mets hypertension, + CAD and + Peripheral Vascular Disease  ?(-) Past MI + dysrhythmias Atrial Fibrillation  ?Rhythm:Regular Rate:Normal ?- Systolic murmurs ?TTE 2017 unremarkable ?  ?Neuro/Psych ?negative neurological ROS ? negative psych ROS  ? GI/Hepatic ?neg GERD  ,(+)  ?  ? (-) substance abuse ? ,   ?Endo/Other  ?diabetes ? Renal/GU ?negative Renal ROS  ? ?  ?Musculoskeletal ? ?(+) Arthritis ,  ? Abdominal ?  ?Peds ? Hematology ?  ?Anesthesia Other Findings ?Past Medical History: ?No date: Alcohol abuse, in remission ?    Comment:  remote  ?No date: Arthritis ?    Comment:  BACK AND NECK ?No date: Benign essential tremor ?    Comment:  improved on metoprolol and gabapentin ?12/30/2012: BPH (benign prostatic hypertrophy) ?03/2010: Cardiac arrhythmia ?    Comment:  h/o a flutter and CM, s/p ablation, normal stress test ?No date: Cataract ?    Comment:  bilat removed  ?09/23/2015: Chemotherapy-induced neutropenia ([place]) ?No date: Chronic low  back pain ?    Comment:  MRI 2004, multilevel DDD ?No date: Colon polyps ?    Comment:  next colonoscopy due around 2018 ?age 70-26: Complication of anesthesia ?    Comment:  DID NOT GET COMPLETELY NUMB WITH TONSILLECTOMY ?No date: COPD (chronic obstructive pulmonary disease) ([place]) ?No date: Coronary artery disease ?No date: Dyslipidemia ?    Comment:  mild off meds ?No date: GERD (gastroesophageal reflux disease) ?No date: History of kidney stones ?    Comment:  h/o ?No date: HTN (hypertension) ?No date: Macular degeneration, bilateral ?02/2015: Multiple pulmonary nodules ?No date: Neuromuscular disorder ([place]) ?    Comment:  neuropathy from chemo....in feet ?12/23/2015: Peripheral neuropathy due to chemotherapy [facility]) ?    Comment:  Saw Dr Manuella Ghazi, on gabapentin and cymbalta with hydrocodone ?             PRN (03/2016) ?02/18/2015: Personal history of tobacco use, presenting hazards to  ?health ?    Comment:  quit 06/2011, restarted 2014 ?04/01/2015: Pneumothorax after biopsy ?No date: Pre-diabetes ?2016: Primary cancer of bladder ([place]) ?    Comment:  Budzyn ?2016: Primary lung cancer ([place]) ?    Comment:  port a cath removed 03/2016 ?No date: Shortness of breath dyspnea ?    Comment:  OCC WITH EXERTION ? Reproductive/Obstetrics ? ?  ? ? ? ? ? ? ? ? ? ? ? ? ? ?  ?  ? ? ? ? ? ? ? ? ?Anesthesia Physical ? ?Anesthesia  Plan ? ?ASA: 4 ? ?Anesthesia Plan: Spinal  ? ?Post-op Pain Management: Ofirmev IV (intra-op)*  ? ?Induction: Intravenous ? ?PONV Risk Score and Plan: 2 and Ondansetron, Dexamethasone, Midazolam and Treatment may vary due to age or medical condition ? ?Airway Management Planned: Natural Airway and Simple Face Mask ? ?Additional Equipment: None ? ?Intra-op Plan:  ? ?Post-operative Plan: Possible Post-op intubation/ventilation ? ?Informed Consent: I have reviewed the patients History and Physical, chart, labs and discussed the procedure including the risks, benefits and alternatives for the proposed anesthesia with  the patient or authorized representative who has indicated his/her understanding and acceptance.  ? ? ? ?Dental advisory given ? ?Plan Discussed with: CRNA and Surgeon ? ?Anesthesia Plan Comments: (Discussed R/B/A of neuraxial anesthesia technique with patient: ?- rare risks of spinal/epidural hematoma, nerve damage, infection ?- Risk of PDPH ?- Risk of nausea and vomiting ?- Risk of conversion to general anesthesia and its associated risks, including sore throat, damage to lips/eyes/teeth/oropharynx, and rare risks such as cardiac and respiratory events. Greatly increased due to patient's COPD. ?- Risk of allergic reactions ? ?Discussed the role of CRNA in patient's perioperative care. ? ?Patient counseled on being higher risk for anesthesia due to comorbidities: severe O2-dependent COPD. Patient was told about increased risk of cardiac and respiratory events, including death. ? ? ?Patient voiced understanding.)  ? ? ? ? ? ? ?Anesthesia Quick Evaluation ? ?

## 2021-08-19 NOTE — Anesthesia Postprocedure Evaluation (Signed)
Anesthesia Post Note ? ?Patient: Joseph Graham ? ?Procedure(s) Performed: ARTHROPLASTY BIPOLAR HIP (HEMIARTHROPLASTY) (Left: Hip) ? ?Patient location during evaluation: PACU ?Anesthesia Type: Spinal ?Level of consciousness: oriented and awake and alert ?Pain management: pain level controlled ?Vital Signs Assessment: post-procedure vital signs reviewed and stable ?Respiratory status: spontaneous breathing, respiratory function stable and patient connected to nasal cannula oxygen ?Cardiovascular status: blood pressure returned to baseline and stable ?Postop Assessment: no headache, no backache and no apparent nausea or vomiting ?Anesthetic complications: no ? ? ?No notable events documented. ? ? ?Last Vitals:  ?Vitals:  ? 08/19/21 1107 08/19/21 1119  ?BP: (!) 164/61 (!) 160/55  ?Pulse:  (!) 110  ?Resp: (!) 21 18  ?Temp:  37 ?C  ?SpO2:  100%  ?  ?Last Pain:  ?Vitals:  ? 08/19/21 1237  ?TempSrc:   ?PainSc: 5   ? ? ?  ?  ?  ?  ?  ?  ? ?Arita Miss ? ? ? ? ?

## 2021-08-19 NOTE — Op Note (Signed)
OPERATIVE NOTE ? ?DATE OF SURGERY:  08/19/2021 ? ?PATIENT NAME:  Joseph Graham   ?DOB: 02-11-1941  ?MRN: [id number] ? ? ?PRE-OPERATIVE DIAGNOSIS:  LEFT Displaced femoral neck fracture ? ?POST-OPERATIVE DIAGNOSIS:  Same ? ?PROCEDURE:  Left hip hemi arthroplasty ? ?SURGEON:  Nida Boatman, M.D.  ? ?ASSISTANT: None ? ?ANESTHESIA: spinal and IV sedation ? ?ESTIMATED BLOOD LOSS: 100 mL ? ?FLUIDS REPLACED: Seen anesthesia record ? ?TOURNIQUET TIME: None  ? ?DRAINS: None ? ?IMPLANTS UTILIZED: [employer]ker Accolade Femoral Fracture Stem, Size 6. Neutral 21m unipolar hemi arthroplasty head. ? ?INDICATIONS FOR SURGERY: JWYAT INFINGERis a 81y.o. year old male who has been seen for complaints of Left hip pain after ground level fall. After discussion of the risks and benefits of surgical intervention, the patient expressed understanding of the risks benefits and agree with plans for Left hemi hip arthroplasty.  ? ?PROCEDURE IN DETAIL: Patient was met in the pre-operative holding area. His consent was verified and his site was marked. He was taken back to the operating room where spinal anesthesia was performed. He was transferred to the operating room table and placed into lateral position with all bony prominences padded and axillary roll in place. His operative extremity was prepped and drapped in standard sterile fashion. A surgical timeout was performed verifying correct patient, procedure, antibiotics, and TXA. All in the room were in agreement.  ? ?A standard posterior approach was made tagging both piriformis and the capsule for later repair. The head was easily removed and sized on the back table. Head trials were used and a 539mhead was selected to have good fit in the native acetabulum.  ? ?The acetabulum was debrided of soft tissue and any loose bodies.  ? ?Attention was taken to femoral preparation. The neck cut was revised. A box osteotome was used, and canal finder used to find the central canal. A rasp was used to  remove the lateral bone. Sequentially the femur was broached until there was good fit. This was trialed with a neutral neck and appropriate sized head. The patient had acceptable stability in 90 degrees of flexion and 70 degrees of IR. The patient was stable in the position of sleep and had acceptable leg length measurements.  ? ?The trials were removed and the canal was irrigated. The final stem was placed without any evidence of fracture propagation. The stem had good stability axially and rotationally. The head length was re-trialed and deemed to be a stable construct. The trunion was irrigated and cleaned and the final head was placed. The acetabulum was irrigated of debris and the hip was reduced.  ? ?The wound was irrigated and the posterior capsule and piriformis were repaired separately through drill holes in the femur and the abductor tendon insertion, respectively. The wound was re-irrigated and 1gm of vancomycin powder was placed into the wound.  ? ?The IT band was closed with a running #2 suture. The skin was closed with 2-0 monocryl and staples. A sterile dressing was applied.  ? ?The patient was awoken from anesthesia and taken to the PACU without complication ? ?Aftercare: The patient will be WBAT, with posterior hip precautions. He will follow up with Dr. BoAlvira Philipsn 10-14 days for wound check and staple removal.  ? ? ?KyNida Boatman.D.   ?

## 2021-08-19 NOTE — Progress Notes (Signed)
[place] at [facility] ? ? ?PATIENT NAME: Joseph Graham   ? ?MR#:  [id number] ? ?DATE OF BIRTH:  1940/10/27 ? ?SUBJECTIVE:  ? ? ?just got back from surgery. Has tremors which are chronic. Wife at bedside ? ? ?VITALS:  ?Blood pressure (!) 160/55, pulse (!) 110, temperature 98.6 ?F (37 ?C), resp. rate 18, height 5\' 8"  (1.727 m), weight 72.6 kg, SpO2 100 %. ? ?PHYSICAL EXAMINATION:  ? ?GENERAL:  81 y.o.-year-old patient lying in the bed with no acute distress. Essential tremor oral ?LUNGS: Normal breath sounds bilaterally, no wheezing, rales, rhonchi.  ?CARDIOVASCULAR: S1, S2 normal. No murmurs, rubs, or gallops.  ?ABDOMEN: Soft, nontender, nondistended. Bowel sounds present.  ?EXTREMITIES: No  edema b/l.   Left hip dressing+ ?NEUROLOGIC: nonfocal  patient is alert and awake ?SKIN: No obvious rash, lesion, or ulcer.  ? ?LABORATORY PANEL:  ?CBC ?Recent Labs  ?Lab 08/19/21 ?5573  ?WBC 11.9*  ?HGB 9.7*  ?HCT 30.3*  ?PLT 224  ? ? ?Chemistries  ?Recent Labs  ?Lab 08/18/21 ?1320 08/19/21 ?2202  ?NA 140 138  ?K 4.3 3.9  ?CL 99 99  ?CO2 30 28  ?GLUCOSE 75 97  ?BUN 33* 27*  ?CREATININE 0.90 0.69  ?CALCIUM 9.2 9.0  ?AST 41  --   ?ALT 19  --   ?ALKPHOS 73  --   ?BILITOT 1.1  --   ? ?Cardiac Enzymes ?No results for input(s): TROPONINI in the last 168 hours. ?RADIOLOGY:  ?DG Chest Port 1 View ? ?Result Date: 08/18/2021 ?CLINICAL DATA:  Mechanical fall, weakness EXAM: PORTABLE CHEST 1 VIEW COMPARISON:  Radiograph 05/02/2020, chest CT 01/05/2021 FINDINGS: Right medial mid lung consolidation similar to prior chest CT. Stable right apical opacity with adjacent clips. No new airspace disease. No pleural effusion. No pneumothorax. No acute osseous abnormality. No acute osseous abnormality. IMPRESSION: Stable exam with similar right medial mid lung consolidation in comparison to prior chest CT in July 2022. Unchanged right apical opacity with adjacent clips. No new airspace disease. Electronically Signed   By:  Maurine Simmering M.D.   On: 08/18/2021 13:50  ? ?DG HIP UNILAT WITH PELVIS 2-3 VIEWS LEFT ? ?Result Date: 08/19/2021 ?CLINICAL DATA:  Status post left hip hemiarthroplasty EXAM: DG HIP (WITH OR WITHOUT PELVIS) 2-3V LEFT COMPARISON:  Yesterday FINDINGS: Interval left hip arthroplasty. Mild right hip osteoarthritis. No periprosthetic fracture or acute hardware complication. Vascular calcifications. IMPRESSION: Expected appearance after left hip arthroplasty. Electronically Signed   By: Abigail Miyamoto M.D.   On: 08/19/2021 12:25  ? ?DG Hip Unilat With Pelvis 2-3 Views Left ? ?Result Date: 08/18/2021 ?CLINICAL DATA:  Fall last night EXAM: DG HIP (WITH OR WITHOUT PELVIS) 2-3V LEFT COMPARISON:  None. FINDINGS: There is a mildly displaced fracture of the left femoral neck with proximal displacement of the distal fragment. Femoroacetabular alignment is maintained. There is no other fracture. Alignment is maintained on the right. The SI joints and symphysis pubis are intact. IMPRESSION: Mildly displaced left femoral neck fracture. Electronically Signed   By: Valetta Mole M.D.   On: 08/18/2021 13:48   ? ?Assessment and Plan ?Joseph Graham is a 81 y.o. male with history of severe COPD with chronic hypoxic respiratory failure on 5 L oxygen, hypertension, B12 deficiency anemia, lung cancer and bladder cancer in remission, presents to the ER after patient had a fall yesterday.  Since the fall patient has been having left hip pain.  Patient states he was trying to  wear his neck brace which he wears for neck arthritis when he slipped and fell on his back.  Not hit his head or lose consciousness. ? ?Left hip fracture left displaced femoral neck fracture  ?status post left hemi arthroplasty  ?-- patient had mechanical fall at home ?--- patient is status post surgery by orthopedic Dr. Gaspar Bidding ?-- PRN pain meds ?-- physical therapy occupational therapy ? ?chronic hypoxic respiratory failure secondary to severe COPD on chronic 5 L home  oxygen ?-- continue nebs and PRN inhalers ? ?Hypertension ?-- resume lisinopril and beta-blockers ? ?chronic essential tremors ?-- PRN gabapentin ? ?history of neck arthritis-- uses brace ? ?history of lung and bladder cancer ?-- not on any treatment ? ?TOC for discharge planning. Patient desires to go home ? ? ?Procedures: Left hip hemi arthroplasty ?Family communication : wife at bedside ?Consults :ortho ?CODE STATUS: FULL ?DVT Prophylaxis : ?Level of care: Telemetry Medical ?Status is: Inpatient ?Remains inpatient appropriate because: mechanical fall post surgery anticipate discharge in 1 to 3 day ?  ? ?TOTAL TIME TAKING CARE OF THIS PATIENT: 25 minutes.  ?>50% time spent on counselling and coordination of care ? ?Note: This dictation was prepared with Dragon dictation along with smaller phrase technology. Any transcriptional errors that result from this process are unintentional. ? ?Fritzi Mandes M.D  ? ? ?[facility]  ? ?CC: ?Primary care physician; [name], MD  ?

## 2021-08-20 LAB — GLUCOSE, CAPILLARY: Glucose-Capillary: 116 mg/dL — ABNORMAL HIGH (ref 70–99)

## 2021-08-20 MED ORDER — GABAPENTIN 100 MG PO CAPS: 100.0000 mg | ORAL_CAPSULE | Freq: Three times a day (TID) | ORAL | Status: DC

## 2021-08-20 MED ORDER — ASPIRIN 81 MG PO CHEW
81.0000 mg | CHEWABLE_TABLET | Freq: Every day | ORAL | Status: DC
  Administered 2021-08-20 – 2021-08-22 (×3): 81 mg via ORAL
  Filled 2021-08-20 (×3): qty 1

## 2021-08-20 MED ORDER — GABAPENTIN 100 MG PO CAPS
100.0000 mg | ORAL_CAPSULE | Freq: Three times a day (TID) | ORAL | Status: DC
  Administered 2021-08-20 – 2021-08-22 (×7): 100 mg via ORAL
  Filled 2021-08-20 (×7): qty 1

## 2021-08-20 MED ORDER — VITAMIN E 45 MG (100 UNIT) PO CAPS
400.0000 [IU] | ORAL_CAPSULE | Freq: Every evening | ORAL | Status: DC
  Administered 2021-08-20 – 2021-08-21 (×2): 400 [IU] via ORAL
  Filled 2021-08-20 (×3): qty 4

## 2021-08-20 MED ORDER — ACETAMINOPHEN 325 MG PO TABS: 650.0000 mg | ORAL_TABLET | Freq: Four times a day (QID) | ORAL | Status: DC | PRN

## 2021-08-20 MED ORDER — VITAMIN B-12 100 MCG PO TABS
100.0000 ug | ORAL_TABLET | Freq: Every evening | ORAL | Status: DC
  Administered 2021-08-20 – 2021-08-21 (×2): 100 ug via ORAL
  Filled 2021-08-20 (×3): qty 1

## 2021-08-20 NOTE — Evaluation (Addendum)
Physical Therapy Evaluation Patient Details Name: Joseph Graham MRN: [id number] DOB: 1940-06-16 Today's Date: 08/20/2021  History of Present Illness  Patient is an 81 year old male s/p left hip hemi arthroplasty. PMH (+) for chronic hypoxic respiratory failure on 5 L oxygen, hypertension, B12 deficiency anemia, lung cancer and bladder cancer in remission.   Clinical Impression  Physical Therapy Evaluation completed on this date. Patient was agreeable to treatment, and tolerated session fairly well. Patient reported 3-4/10 pain throughout session, and entire session was completed on 5L O2 via [place]. Patient on 5L at baseline. Patient reports he lives with his wife in a 2 story home with 1 STE and no handrails. BHRs for steps between levels within the home. Patient has multiple 2 wheeled walkers and a rollator at home. Patient was Mod I with mobility, and required assistance with feeding due to patient's tremors.   Patient demonstrated generalized weakness in BLEs (3/5) L>R due to patient's surgical pain and precautions. He required Max A+2 for all bed mobility, and Mod A +2 for sit to stand transfer from EOB. Frequent cueing required throughout session to maintain hip precautions. 2 sit to stand transfers were completed from EOB, with significant tremors present with both trials. Patient required a long seated rest break in-between bouts due to fatigue and pain. Ambulation deferred due to safety with tremors. Patient was returned to bed and left with all needs met. Patient would continue to benefit from skilled physical therapy in order to optimize patient's return to PLOF. Recommend STR upon discharge from acute hospitalization.      Recommendations for follow up therapy are one component of a multi-disciplinary discharge planning process, led by the attending physician.  Recommendations may be updated based on patient status, additional functional criteria and insurance authorization.  Follow Up  Recommendations Skilled nursing-short term rehab (<3 hours/day)    Assistance Recommended at Discharge Frequent or constant Supervision/Assistance  Patient can return home with the following  Two people to help with walking and/or transfers;A lot of help with walking and/or transfers;A lot of help with bathing/dressing/bathroom;Two people to help with bathing/dressing/bathroom;Assistance with feeding;Assist for transportation;Help with stairs or ramp for entrance    Equipment Recommendations BSC/3in1  Recommendations for Other Services  OT consult    Functional Status Assessment Patient has had a recent decline in their functional status and demonstrates the ability to make significant improvements in function in a reasonable and predictable amount of time.     Precautions / Restrictions Precautions Precautions: Posterior Hip;Fall Restrictions Weight Bearing Restrictions: Yes LLE Weight Bearing: Weight bearing as tolerated      Mobility  Bed Mobility Overal bed mobility: Needs Assistance Bed Mobility: Supine to Sit, Sit to Supine     Supine to sit: Max assist, +2 for physical assistance Sit to supine: Max assist, +2 for physical assistance   General bed mobility comments: significant cueing required to maintain hip precautions    Transfers Overall transfer level: Needs assistance Equipment used: Rolling walker (2 wheels) Transfers: Sit to/from Stand Sit to Stand: Mod assist, +2 physical assistance, +2 safety/equipment (completed 2 sit to stand transfers with seated rest break inbetween)           General transfer comment: increased BUE and LLE tremor with transfers and when patient is in pain    Ambulation/Gait Ambulation/Gait assistance:  (deferred for safety)                Stairs  Wheelchair Mobility °  ° °Modified Rankin (Stroke Patients Only) °  ° °  ° °Balance Overall balance assessment: Needs assistance °Sitting-balance support:  Bilateral upper extremity supported, Feet supported °Sitting balance-Leahy Scale: Poor °  °Postural control: Posterior lean °Standing balance support: Bilateral upper extremity supported, Reliant on assistive device for balance, During functional activity °Standing balance-Leahy Scale: Poor °  °  °  °  °  °  °  °  °  °  °  °  °   ° ° ° °Pertinent Vitals/Pain Pain Assessment °Pain Assessment: 0-10 °Pain Score: 4  °Pain Location: L hip °Pain Descriptors / Indicators: Aching, Grimacing °Pain Intervention(s): Limited activity within patient's tolerance, Monitored during session, Repositioned  ° ° °Home Living Family/patient expects to be discharged to:: Private residence °Living Arrangements: Spouse/significant other (Wife) °Available Help at Discharge: Family;Available 24 hours/day;Available PRN/intermittently (Wife can help some (24/7), daughter and grandson live down the street and can help PRN) °Type of Home: House °Home Access: Stairs to enter °Entrance Stairs-Rails: None °Entrance Stairs-Number of Steps: 1 °Alternate Level Stairs-Number of Steps: 1 flight °Home Layout: Two level °Home Equipment: Rolling Walker (2 wheels);Rollator (4 wheels);Grab bars - toilet;Grab bars - tub/shower °   °  °Prior Function   °  °  °  °  °  °  °Mobility Comments: Mod I with ambulation, RW or rollator °ADLs Comments: requires assistance with feeding due to tremors °  ° ° °Hand Dominance  ° Dominant Hand: Left ° °  °Extremity/Trunk Assessment  ° Upper Extremity Assessment °Upper Extremity Assessment: Generalized weakness °  ° °Lower Extremity Assessment °Lower Extremity Assessment: Generalized weakness;LLE deficits/detail (at least 3/5 bilaterally) °LLE Deficits / Details: Decreased strength and ROM due to hip preacutions and pain °  ° °   °Communication  ° Communication: No difficulties  °Cognition Arousal/Alertness: Awake/alert °Behavior During Therapy: WFL for tasks assessed/performed °Overall Cognitive Status: Within Functional  Limits for tasks assessed °  °  °  °  °  °  °  °  °  °  °  °  °  °  °  °  °General Comments: A&Ox3- self, location, situation °  °  ° °  °General Comments General comments (skin integrity, edema, etc.): SpO2 93% on 5L via [place], HR: 125bpm ° °  °Exercises Other Exercises °Other Exercises: patient educated on role if PT in acute setting, fall risk, hip precautions, WBing precautions, and d/c recommendations  ° °Assessment/Plan  °  °PT Assessment Patient needs continued PT services  °PT Problem List Decreased strength;Decreased mobility;Decreased safety awareness;Decreased range of motion;Decreased knowledge of precautions;Decreased activity tolerance;Decreased balance;Decreased knowledge of use of DME;Pain;Decreased skin integrity ° °   °  °PT Treatment Interventions DME instruction;Therapeutic activities;Gait training;Therapeutic exercise;Stair training;Balance training;Patient/family education;Functional mobility training   ° °PT Goals (Current goals can be found in the Care Plan section)  °Acute Rehab PT Goals °Patient Stated Goal: to go home °PT Goal Formulation: With patient °Time For Goal Achievement: 09/03/21 °Potential to Achieve Goals: Fair ° °  °Frequency BID °  ° ° °Co-evaluation   °  °  °  °  ° ° °  °AM-PAC PT "6 Clicks" Mobility  °Outcome Measure Help needed turning from your back to your side while in a flat bed without using bedrails?: A Lot °Help needed moving from lying on your back to sitting on the side of a flat bed without using bedrails?: A Lot °Help needed moving to and from a   bed to a chair (including a wheelchair)?: A Lot °Help needed standing up from a chair using your arms (e.g., wheelchair or bedside chair)?: A Lot °Help needed to walk in hospital room?: Total °Help needed climbing 3-5 steps with a railing? : Total °6 Click Score: 10 ° °  °End of Session Equipment Utilized During Treatment: Gait belt;Oxygen °Activity Tolerance: Patient limited by pain;Other (comment) (Patient limited by  tremors) °Patient left: in bed;with call bell/phone within reach;with bed alarm set °Nurse Communication: Mobility status °PT Visit Diagnosis: Unsteadiness on feet (R26.81);Muscle weakness (generalized) (M62.81);Difficulty in walking, not elsewhere classified (R26.2);Pain °Pain - Right/Left: Left °Pain - part of body: Hip °  ° °Time: 0932-1003 °PT Time Calculation (min) (ACUTE ONLY): 31 min ° ° °Charges:   PT Evaluation °$PT Eval Low Complexity: 1 Low °PT Treatments °$Therapeutic Activity: 8-22 mins °  °   ° °Christina Mullen, PT  °08/20/21. 11:03 AM ° ° °

## 2021-08-20 NOTE — TOC Initial Note (Signed)
Transition of Care (TOC) - Initial/Assessment Note  ? ? ?Patient Details  ?Name: Joseph Graham ?MRN: [id number] ?Date of Birth: January 31, 1941 ? ?Transition of Care (TOC) CM/SW Contact:    ?Adelene Amas, LCSW ?Phone Number: [phone] ?08/20/2021, 4:30 PM ? ?Clinical Narrative:                 ? ?Estimated discharge plan for 08/21/2021 or 08/22/2021. Patient presents to [facility] due to mechanical fall with dx including CAD, diabetes, and COPD. Patient reports he fell last night from standing position, and injured his left hip at that time was unable to  ambulate the following morning, complains of pain primarily in his left hip.  No head injury Patient lives at home and needs assistance with ADLs.  Patient's spouse Hristopher, [place] [phone] is main contact.  PT has recommended SNF placement, patient is currently 2 person max assist and spouse is unable to meet his care needs. Patient is refusing SNF placement would prefer to go home with home health and private caregiver.  CSW went over [facility] role in patient care and difference between SNF placement and home health. Patient is convinced he will improve during his stay at the hospital and will be abel to return home with ome health.  CSW did confirm to patient and spouse, patient has capacity and if he decided to return home with home health, regardless of recommendations, we would assist with that discharge plan. Patient and spouse verbalized understanding. ? ?Expected Discharge Plan: [place] ?Barriers to Discharge: Continued Medical Work up ? ? ?Patient Goals and CMS Choice ?Patient states their goals for this hospitalization and ongoing recovery are:: Patient doe snot want SNF palcemetn, prefers home health wiwth private caregiver. ?  ?  ? ?Expected Discharge Plan and Services ?Expected Discharge Plan: [place] ?In-house Referral: Clinical Social Work ?  ?Post Acute Care Choice: [place] ?Living arrangements for the  past 2 months: [place] ?                ?  ?  ?  ?  ?  ?  ?  ?  ?  ?  ? ?Prior Living Arrangements/Services ?Living arrangements for the past 2 months: [place] ?Lives with:: Spouse (Delfino,Patricia (Spouse)   [phone] ([place])) ?Patient language and need for interpreter reviewed:: Yes ?Do you feel safe going back to the place where you live?: Yes      ?Need for Family Participation in Patient Care: Yes (Comment) ?Care giver support system in place?: Yes (comment) ?  ?Criminal Activity/Legal Involvement Pertinent to Current Situation/Hospitalization: No - Comment as needed ? ?Activities of Daily Living ?Home Assistive Devices/Equipment: Gilford Rile (specify type), Oxygen, Eyeglasses ?ADL Screening (condition at time of admission) ?Patient's cognitive ability adequate to safely complete daily activities?: Yes ?Is the patient deaf or have difficulty hearing?: No ?Does the patient have difficulty seeing, even when wearing glasses/contacts?: No ?Does the patient have difficulty concentrating, remembering, or making decisions?: No ?Patient able to express need for assistance with ADLs?: Yes ?Does the patient have difficulty dressing or bathing?: No ?Independently performs ADLs?: No ?Communication: Independent ?Dressing (OT): Independent ?Grooming: Independent ?Feeding: Independent ?Bathing: Independent ?Toileting: Independent ?In/Out Bed: Needs assistance ?Is this a change from baseline?: Change from baseline, expected to last <3 days ?Walks in Home: Independent with device (comment) (walker) ?Does the patient have difficulty walking or climbing stairs?: Yes ?Weakness of Legs: Both ?Weakness of Arms/Hands: None ? ?Permission Sought/Granted ?Permission sought to  share information with : Family Supports ?Permission granted to share information with : Yes, Verbal Permission Granted ? Share Information with NAME: Nichelson,Patricia (Spouse)   [phone] ([place]) ?   ?   ?   ? ?Emotional  Assessment ?Appearance:: Appears stated age ?Attitude/Demeanor/Rapport: Engaged ?Affect (typically observed): Defensive, Stable ?Orientation: : Oriented to Self, Oriented to Place, Oriented to  Time, Oriented to Situation ?Alcohol / Substance Use: Not Applicable ?Psych Involvement: No (comment) ? ?Admission diagnosis:  Hip fracture ([place]) [S72.009A] ?Fall [W19.XXXA] ?Closed fracture of left hip, initial encounter ([place]) [S72.002A] ?Patient Active Problem List  ? Diagnosis Date Noted  ? Hip fracture ([place]) 08/18/2021  ? Acute pharyngitis 06/14/2021  ? Diverticulum of [place] 01/19/2021  ? Atherosclerosis of aorta ([place]) 01/19/2021  ? Stasis ulcer ([place]) 09/30/2020  ? Vitamin D deficiency 07/18/2020  ? Recurrent pneumothorax after pleural drain removed 04/26/2020  ? Chronic respiratory failure with hypoxia ([place]) 04/26/2020  ? Bullous emphysema ([place]) 04/26/2020  ? Grade 1 Retrolisthesis of L2/L3 11/25/2019  ? Abnormal CT scan, lumbar spine (01/22/2019) 11/25/2019  ? Osteoarthritis involving multiple joints 08/18/2019  ? Rhinitis medicamentosa 07/14/2019  ? Right bundle branch block (RBBB) with left anterior hemiblock 01/21/2019  ? Other specified spondylopathies, sacral and sacrococcygeal region [facility]) 01/15/2019  ? Spondylosis without myelopathy or radiculopathy, lumbosacral region 08/28/2018  ? Abnormal MRI, lumbar spine (06/12/2018) 08/20/2018  ? DDD (degenerative disc disease), lumbosacral 08/07/2018  ? Lumbar facet hypertrophy (Multilevel) (Bilateral) 08/04/2018  ? Osteoarthritis of facet joint of lumbar spine 08/04/2018  ? Lumbar spondylosis 08/04/2018  ? Lumbar facet syndrome (Bilateral) (L>R) 08/04/2018  ? Lumbar lateral recess stenosis (L2-3, L3-4, L4-5) (Right) 08/04/2018  ? Lumbar foraminal stenosis (Right: L2-3, L3-4) (Bilateral: L4-5) 08/04/2018  ? Chronic low back pain (Primary area of Pain) (Bilateral)  (L>R) w/o sciatica 07/14/2018  ? Chronic coccyx pain (Secondary Area of Pain) (Midline) 07/14/2018  ?  Chronic lower extremity pain [facility] Area of Pain) (Bilateral) (R>L) 07/14/2018  ? Chronic pain syndrome 07/14/2018  ? Long term current use of opiate analgesic 07/14/2018  ? Polypharmacy 07/14/2018  ? Disorder of skeletal system 07/14/2018  ? Problems influencing health status 07/14/2018  ? HLD (hyperlipidemia) 07/10/2018  ? DDD (degenerative disc disease), lumbar 07/10/2018  ? Macular degeneration of both eyes 10/01/2017  ? Unintentional weight loss 07/30/2016  ? Mixed sensory-motor polyneuropathy 07/30/2016  ? Vitamin B12 deficiency 07/30/2016  ? Peripheral neuropathy due to chemotherapy ([place]) 12/23/2015  ? Malignant neoplasm of right upper lobe of lung ([place]) 12/20/2015  ? Pedal edema 12/09/2015  ? COPD, severe ([place])   ? Primary cancer of bladder ([place])   ? PAD (peripheral artery disease) ([place]) 03/01/2015  ? Coronary artery disease involving native coronary artery of native heart without angina pectoris 03/01/2015  ? Health maintenance examination 01/05/2014  ? Advanced care planning/counseling discussion 01/05/2014  ? Controlled diabetes mellitus type 2 with complications ([place]) 09/81/1914  ? Benign prostatic hyperplasia 12/30/2012  ? Special screening for malignant neoplasms, colon 07/31/2011  ? Medicare annual wellness visit, subsequent 07/31/2011  ? Benign essential tremor   ? Personal history of colonic polyps - adenomas   ? HTN (hypertension)   ? Ex-smoker   ? Cardiac arrhythmia 03/11/2010  ? ?PCP:  [name], MD ?Pharmacy:   ?[id number] - [address], [address] ?[place] ?[place] [address] ?Phone: [phone] Fax: [phone] ? ? ? ? ?Social Determinants of Health (  SDOH) Interventions ?  ? ?Readmission Risk Interventions ?No flowsheet data found. ? ? ?

## 2021-08-20 NOTE — Progress Notes (Signed)
?  Subjective: ? ?Patient reports pain as moderate.  His wife is in the room. ? ?Objective:  ? ?VITALS:   ?Vitals:  ? 08/19/21 1536 08/19/21 1956 08/20/21 0625 08/20/21 0740  ?BP: (!) 152/45 (!) 154/49 (!) 165/65 (!) 156/54  ?Pulse: (!) 103 82 82 83  ?Resp: 18 20 20 18   ?Temp: 98.1 ?F (36.7 ?C) 97.8 ?F (36.6 ?C) 98.5 ?F (36.9 ?C) 98.1 ?F (36.7 ?C)  ?TempSrc: Oral     ?SpO2: 98% 99% 100% 99%  ?Weight:      ?Height:      ? ? ?PHYSICAL EXAM: ? ?ABD soft ?Sensation intact distally ?Dorsiflexion/Plantar flexion intact ?Incision: dressing C/D/I ?No cellulitis present ?Compartment soft ? ?LABS ? ?Results for orders placed or performed during the hospital encounter of 08/18/21 (from the past 24 hour(s))  ?Glucose, capillary     Status: Abnormal  ? Collection Time: 08/19/21  5:04 PM  ?Result Value Ref Range  ? Glucose-Capillary 139 (H) 70 - 99 mg/dL  ?Glucose, capillary     Status: Abnormal  ? Collection Time: 08/19/21 10:42 PM  ?Result Value Ref Range  ? Glucose-Capillary 146 (H) 70 - 99 mg/dL  ? ? ?DG HIP UNILAT WITH PELVIS 2-3 VIEWS LEFT ? ?Result Date: 08/19/2021 ?CLINICAL DATA:  Status post left hip hemiarthroplasty EXAM: DG HIP (WITH OR WITHOUT PELVIS) 2-3V LEFT COMPARISON:  Yesterday FINDINGS: Interval left hip arthroplasty. Mild right hip osteoarthritis. No periprosthetic fracture or acute hardware complication. Vascular calcifications. IMPRESSION: Expected appearance after left hip arthroplasty. Electronically Signed   By: Abigail Miyamoto M.D.   On: 08/19/2021 12:25   ? ?Assessment/Plan: ?1 Day Post-Op  ? ?Principal Problem: ?  Hip fracture ([place]) ?Active Problems: ?  HTN (hypertension) ?  Benign essential tremor ?  COPD, severe ([place]) ?  Chronic respiratory failure with hypoxia (HCC) ? ? ?Up with therapy ?Discharge to SNF OK from ortho standpoint ?RTC 2 weeks for staple removal Carlynn Spry, PA-C ?ASA for DVT prophylaxis ? ?Lovell Sheehan , MD ?08/20/2021, 3:59 PM ? ? ? ? ? ? ?

## 2021-08-20 NOTE — Progress Notes (Signed)
Joseph Graham at [facility] ? ? ?PATIENT NAME: Joseph Graham   ? ?MR#:  [id number] ? ?DATE OF BIRTH:  Sep 27, 1940 ? ?SUBJECTIVE:  ?patient frustrated this morning given recommendations by PT for rehab. He's worried he'll get COVID and given his lung condition would not recover. Wife at bedside tells me he she will not be able to help him at home. Discussed option about rehab and home health. ?Patient wants to increase his Neurontin 100 mg three times a day ?Has tremors which are chronic. Wife at bedside ? ? ?VITALS:  ?Blood pressure (!) 156/54, pulse 83, temperature 98.1 ?F (36.7 ?C), resp. rate 18, height 5\' 8"  (1.727 m), weight 72.6 kg, SpO2 99 %. ? ?PHYSICAL EXAMINATION:  ? ?GENERAL:  81 y.o.-year-old patient lying in the bed with no acute distress. ?LUNGS: Normal breath sounds bilaterally, no wheezing, rales, rhonchi.  ?CARDIOVASCULAR: S1, S2 normal. No murmurs, rubs, or gallops.  ?ABDOMEN: Soft, nontender, nondistended. Bowel sounds present.  ?EXTREMITIES: No  edema b/l.   Left hip dressing+ ?NEUROLOGIC: nonfocal  patient is alert and awake Essential tremor oral and hands ?SKIN: No obvious rash, lesion, or ulcer.  ? ?LABORATORY PANEL:  ?CBC ?Recent Labs  ?Lab 08/19/21 ?5427  ?WBC 11.9*  ?HGB 9.7*  ?HCT 30.3*  ?PLT 224  ? ? ? ?Chemistries  ?Recent Labs  ?Lab 08/18/21 ?1320 08/19/21 ?0623  ?NA 140 138  ?K 4.3 3.9  ?CL 99 99  ?CO2 30 28  ?GLUCOSE 75 97  ?BUN 33* 27*  ?CREATININE 0.90 0.69  ?CALCIUM 9.2 9.0  ?AST 41  --   ?ALT 19  --   ?ALKPHOS 73  --   ?BILITOT 1.1  --   ? ? ?Cardiac Enzymes ?No results for input(s): TROPONINI in the last 168 hours. ?RADIOLOGY:  ?DG Chest Port 1 View ? ?Result Date: 08/18/2021 ?CLINICAL DATA:  Mechanical fall, weakness EXAM: PORTABLE CHEST 1 VIEW COMPARISON:  Radiograph 05/02/2020, chest CT 01/05/2021 FINDINGS: Right medial mid lung consolidation similar to prior chest CT. Stable right apical opacity with adjacent clips. No new airspace disease. No pleural  effusion. No pneumothorax. No acute osseous abnormality. No acute osseous abnormality. IMPRESSION: Stable exam with similar right medial mid lung consolidation in comparison to prior chest CT in July 2022. Unchanged right apical opacity with adjacent clips. No new airspace disease. Electronically Signed   By: Maurine Simmering M.D.   On: 08/18/2021 13:50  ? ?DG HIP UNILAT WITH PELVIS 2-3 VIEWS LEFT ? ?Result Date: 08/19/2021 ?CLINICAL DATA:  Status post left hip hemiarthroplasty EXAM: DG HIP (WITH OR WITHOUT PELVIS) 2-3V LEFT COMPARISON:  Yesterday FINDINGS: Interval left hip arthroplasty. Mild right hip osteoarthritis. No periprosthetic fracture or acute hardware complication. Vascular calcifications. IMPRESSION: Expected appearance after left hip arthroplasty. Electronically Signed   By: Abigail Miyamoto M.D.   On: 08/19/2021 12:25  ? ?DG Hip Unilat With Pelvis 2-3 Views Left ? ?Result Date: 08/18/2021 ?CLINICAL DATA:  Fall last night EXAM: DG HIP (WITH OR WITHOUT PELVIS) 2-3V LEFT COMPARISON:  None. FINDINGS: There is a mildly displaced fracture of the left femoral neck with proximal displacement of the distal fragment. Femoroacetabular alignment is maintained. There is no other fracture. Alignment is maintained on the right. The SI joints and symphysis pubis are intact. IMPRESSION: Mildly displaced left femoral neck fracture. Electronically Signed   By: Valetta Mole M.D.   On: 08/18/2021 13:48   ? ?Assessment and Plan ?Joseph Graham is a 81 y.o.  male with history of severe COPD with chronic hypoxic respiratory failure on 5 L oxygen, hypertension, B12 deficiency anemia, lung cancer and bladder cancer in remission, presents to the ER after patient had a fall yesterday.  Since the fall patient has been having left hip pain.  Patient states he was trying to wear his neck brace which he wears for neck arthritis when he slipped and fell on his back.  Not hit his head or lose consciousness. ? ?Left hip fracture left displaced  femoral neck fracture  ?status post left hemi arthroplasty  ?-- patient had mechanical fall at home ?--- patient is status post surgery by orthopedic Dr. Gaspar Bidding ?-- PRN pain meds ?-- physical therapy occupational therapy--recommends rehab. Patient not much came to go to rehab. Wife tells me she will not be able to help much at home. Social worker for discharge planning. ? ?chronic hypoxic respiratory failure secondary to severe COPD on chronic 5 L home oxygen ?-- continue nebs and PRN inhalers ? ?Hypertension ?-- resume lisinopril and beta-blockers ? ?chronic essential tremors ?-- patient would like to increase gabapentin 100 milligrams TID ? ?history of lung and bladder cancer ?-- not on any treatment ? ?TOC for discharge planning.  ? ? ?Procedures: Left hip hemi arthroplasty ?Family communication : wife at bedside ?Consults :ortho ?CODE STATUS: FULL ?DVT Prophylaxis : ?Level of care: Telemetry Medical ?Status is: Inpatient ?Remains inpatient appropriate because: mechanical fall post surgery anticipate discharge in 1 to 2 days. ? ?TOC to work for rehab placement. Patient is however keen on home health. Wife prefers patient going to rehab ?  ? ?TOTAL TIME TAKING CARE OF THIS PATIENT: 25 minutes.  ?>50% time spent on counselling and coordination of care ? ?Note: This dictation was prepared with Dragon dictation along with smaller phrase technology. Any transcriptional errors that result from this process are unintentional. ? ?Fritzi Mandes M.D  ? ? ?[facility]  ? ?CC: ?Primary care physician; [name], MD  ?

## 2021-08-20 NOTE — Evaluation (Addendum)
Clinical/Bedside Swallow Evaluation Patient Details  Name: Joseph Graham MRN: [id number] Date of Birth: 1940-07-16  Today's Date: 08/20/2021 Time: SLP Start Time (ACUTE ONLY): 9211 SLP Stop Time (ACUTE ONLY): 1330 SLP Time Calculation (min) (ACUTE ONLY): 25 min  Past Medical History:  Past Medical History:  Diagnosis Date   Alcohol abuse, in remission    remote    Arthritis    BACK AND NECK   Benign essential tremor    improved on metoprolol and gabapentin   BPH (benign prostatic hypertrophy) 12/30/2012   Cardiac arrhythmia 03/2010   h/o a flutter and CM, s/p ablation, normal stress test   Cataract    bilat removed    Chemotherapy-induced neutropenia ([place]) 09/23/2015   Chronic low back pain    MRI 2004, multilevel DDD   Colon polyps    next colonoscopy due around 9417   Complication of anesthesia age 25-23   DID NOT GET COMPLETELY NUMB WITH TONSILLECTOMY   COPD (chronic obstructive pulmonary disease) ([place])    Coronary artery disease    Diabetes mellitus without complication ([place])    Pt states his A1C is 6.7 and is diet controlled.    Dyslipidemia    mild off meds   GERD (gastroesophageal reflux disease)    History of kidney stones    h/o   HTN (hypertension)    Macular degeneration, bilateral    Multiple pulmonary nodules 02/2015   Neuromuscular disorder (HCC)    neuropathy from chemo....in feet   Peripheral neuropathy due to chemotherapy ([place]) 12/23/2015   Saw Dr Manuella Ghazi, on gabapentin and cymbalta with hydrocodone PRN (03/2016)   Personal history of tobacco use, presenting hazards to health 02/18/2015   quit 06/2011, restarted 2014   Pneumothorax after biopsy 04/01/2015   Pre-diabetes    Primary cancer of bladder ([place]) 2016   [place]   Primary lung cancer ([place]) 2016   port a cath removed 03/2016   Shortness of breath dyspnea    OCC WITH EXERTION   Past Surgical History:  Past Surgical History:  Procedure Laterality Date   A FLUTTER ABLATION  03/2010   carotid US   03/2010   WNL   CATARACT EXTRACTION     COLONOSCOPY  09/2011   polyps, rpt due 5 yrs, mild diverticulosis [name])   CYSTOSCOPY W/ RETROGRADES Bilateral 04/06/2015   Procedure: CYSTOSCOPY WITH RETROGRADE PYELOGRAM;  Surgeon: [name], MD;  Location: [facility];  Service: Urology;  Laterality: Bilateral;   ELECTROMAGNETIC NAVIGATION BROCHOSCOPY N/A 03/02/2015   Procedure: ELECTROMAGNETIC NAVIGATION BRONCHOSCOPY;  Surgeon: [name], MD;  Location: [facility];  Service: Cardiopulmonary;  Laterality: N/A;   HEMI-MICRODISCECTOMY LUMBAR LAMINECTOMY LEVEL 1 N/A 01/26/2019   LEFT L5-S1 FAR 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 [name], [name], MD)   [place]     collapsed lung with chest tube   POLYPECTOMY     PORT-A-CATH REMOVAL Right 04/03/2016   Procedure: REMOVAL PORT-A-CATH;  Surgeon: [name], MD;  Location: [facility];  Service: General;  Laterality: Right;   PORTACATH PLACEMENT N/A 08/10/2015   Procedure: INSERTION PORT-A-CATH;  Surgeon: [name], MD;  Location: [facility];  Service: General;  Laterality: N/A;   TONSILLECTOMY  1964   TRANSURETHRAL RESECTION OF BLADDER TUMOR N/A 04/06/2015   Procedure: TRANSURETHRAL RESECTION OF BLADDER TUMOR (TURBT) right ureteral stent placement;  Surgeon: [name], MD;  Location: [facility];  Service: Urology;  Laterality: N/A;   TRANSURETHRAL RESECTION  OF BLADDER TUMOR N/A 07/13/2015   Procedure: TRANSURETHRAL RESECTION OF BLADDER TUMOR (TURBT);  Surgeon: [name], MD;  Location: [facility];  Service: Urology;  Laterality: N/A;   urinary stent removal  04/14/15   VIDEO ASSISTED THORACOSCOPY (VATS) W/TALC PLEUADESIS Right 04/27/2020   Procedure: VIDEO ASSISTED THORACOSCOPY (VATS) W/TALC PLEUADESIS;  Surgeon: [name], MD;  Location: [facility];  Service: Thoracic;  Laterality: Right;   HPI:  Per Physician's H&P "Joseph Graham is a 81 y.o. male with history of  severe COPD with chronic hypoxic respiratory failure on 5 L oxygen, hypertension, B12 deficiency anemia, lung cancer and bladder cancer in remission, presents to the ER after patient had a fall yesterday.  Since the fall patient has been having left hip pain.  Patient states he was trying to wear his neck brace which he wears for neck arthritis when he slipped and fell on his back.  Not hit his head or lose consciousness." Pt s/p L hip hemioarthroplasty on 08/19/21. CXR on admission "Stable exam with similar right medial mid lung consolidation in comparison to prior chest CT in July 2022. Unchanged right apical opacity with adjacent clips. No new airspace disease."   Assessment / Plan / Recommendation  Clinical Impression  Pt alert, pleasant, and cooperative. Slow speech at times. Wife, Joseph Graham, at bedside. BUE tremors and lingual/facial tremors noted which pt and wife endorse are baseline. On 5L/min O2 via [place] (baseline level of O2 per pt/wife).   Pt's wife noted pt typically with good appetite and consumes a regular diet with thin liquids PTA. Pt and wife deny pt with any difficulty chewing/swallowing; however, endorse difficulty with feeding.  Pt observed consuming items from regular diet with thin liquids from lunch tray. Pt demonstrated a functional oral swallow. No overt or subtle s/sx pharyngeal dysphagia across trials. Seemingly timely swallow initiation and adequate laryngeal elevation. No change to vocal quality noted across trials.   Recommend continuation of regular diet with thin liquids and safe swallowing strategies/aspiration precautions as outlined below. Set up assistance with meals. Pt may need assistance with feeding if BUE tremors worsen. ?benefit from adaptive utensils for feeding. Noted OT evaluation pending. ?ability to meet nutritional needs PO - RD following.  Pt is at mildly increased risk for aspiration given hx of GERD and current respiratory status in setting of severe COPD/hx of  lung cancer.   SLP to sign off as pt has not acute SLP needs at this time.   Pt and wife made aware of results of assessment, safe swallowing strategies/aspiration precautions, and SLP POC. Both verbalized understanding/agreement. RN also made aware.  SLP Visit Diagnosis: Dysphagia, unspecified (R13.10)    Aspiration Risk  Mild aspiration risk    Diet Recommendation Regular;Thin liquid   Medication Administration:  (as tolerated) Supervision: Staff to assist with self feeding (set up assistance; feeding assistance PRN) Compensations: Slow rate;Small sips/bites Postural Changes: Seated upright at 90 degrees;Remain upright for at least 30 minutes after po intake    Other  Recommendations Recommended Consults:  (Dietician f/u; OT consult) Oral Care Recommendations: Oral care BID    Recommendations for follow up therapy are one component of a multi-disciplinary discharge planning process, led by the attending physician.  Recommendations may be updated based on patient status, additional functional criteria and insurance authorization.  Follow up Recommendations No SLP follow up      Assistance Recommended at Discharge  (defer to medical team; suspect need for assistance given recent hip fx)  Functional Status Assessment Patient has not had a recent decline in their functional status  Frequency and Duration  (n/a)   (n/a)       Prognosis Prognosis for Safe Diet Advancement: Good Barriers to Reach Goals:  (COPD; GERD)      Swallow Study   General Date of Onset: 08/18/21 HPI: Per 75 H&P "HARUN BRUMLEY is a 81 y.o. male with history of severe COPD with chronic hypoxic respiratory failure on 5 L oxygen, hypertension, B12 deficiency anemia, lung cancer and bladder cancer in remission, presents to the ER after patient had a fall yesterday.  Since the fall patient has been having left hip pain.  Patient states he was trying to wear his neck brace which he wears for neck  arthritis when he slipped and fell on his back.  Not hit his head or lose consciousness." Pt s/p L hip hemioarthroplasty on 08/19/21. Type of Study: Bedside Swallow Evaluation Previous Swallow Assessment: unknown Diet Prior to this Study: Regular;Thin liquids Respiratory Status: Nasal cannula (5L/min) History of Recent Intubation: No Behavior/Cognition: Alert;Cooperative;Pleasant mood Oral Cavity Assessment: Within Functional Limits Oral Care Completed by SLP: Recent completion by staff Oral Cavity - Dentition: Adequate natural dentition Vision: Functional for self-feeding Self-Feeding Abilities: Needs assist;Needs set up Patient Positioning: Upright in bed Baseline Vocal Quality: Normal Volitional Cough: Strong Volitional Swallow: Able to elicit    Oral/Motor/Sensory Function Overall Oral Motor/Sensory Function: Mild impairment (tremulous lingual and facial musculature; pt and wife endorse baseline, but is exacerbated by pain)   Thin Liquid Thin Liquid: Within functional limits Presentation: Straw Other Comments: ginger ale    Solid     Solid: Within functional limits Presentation: Self Fed Other Comments: egg salad sandwhich     Cherrie Gauze, M.S., [facility] [phone] (ASCOM)   Clearnce Sorrel Emelda Kohlbeck 08/20/2021,1:51 PM

## 2021-08-20 NOTE — Plan of Care (Signed)
?  Problem: Education: ?Goal: Knowledge of General Education information will improve ?Description: Including pain rating scale, medication(s)/side effects and non-pharmacologic comfort measures ?08/20/2021 0537 by Charisse March, LPN ?Outcome: Progressing ?08/20/2021 0537 by Charisse March, LPN ?Outcome: Progressing ?  ?Problem: Health Behavior/Discharge Planning: ?Goal: Ability to manage health-related needs will improve ?08/20/2021 0537 by Charisse March, LPN ?Outcome: Progressing ?08/20/2021 0537 by Charisse March, LPN ?Outcome: Progressing ?  ?Problem: Clinical Measurements: ?Goal: Ability to maintain clinical measurements within normal limits will improve ?08/20/2021 0537 by Charisse March, LPN ?Outcome: Progressing ?08/20/2021 0537 by Charisse March, LPN ?Outcome: Progressing ?Goal: Will remain free from infection ?08/20/2021 0537 by Charisse March, LPN ?Outcome: Progressing ?08/20/2021 0537 by Charisse March, LPN ?Outcome: Progressing ?Goal: Diagnostic test results will improve ?08/20/2021 0537 by Charisse March, LPN ?Outcome: Progressing ?08/20/2021 0537 by Charisse March, LPN ?Outcome: Progressing ?Goal: Respiratory complications will improve ?08/20/2021 0537 by Charisse March, LPN ?Outcome: Progressing ?08/20/2021 0537 by Charisse March, LPN ?Outcome: Progressing ?Goal: Cardiovascular complication will be avoided ?08/20/2021 0537 by Charisse March, LPN ?Outcome: Progressing ?08/20/2021 0537 by Charisse March, LPN ?Outcome: Progressing ?  ?Problem: Activity: ?Goal: Risk for activity intolerance will decrease ?08/20/2021 0537 by Charisse March, LPN ?Outcome: Progressing ?08/20/2021 0537 by Charisse March, LPN ?Outcome: Progressing ?  ?Problem: Nutrition: ?Goal: Adequate nutrition will be maintained ?08/20/2021 0537 by Charisse March, LPN ?Outcome: Progressing ?08/20/2021 0537 by Charisse March, LPN ?Outcome: Progressing ?  ?Problem: Coping: ?Goal: Level of anxiety will decrease ?08/20/2021  0537 by Charisse March, LPN ?Outcome: Progressing ?08/20/2021 0537 by Charisse March, LPN ?Outcome: Progressing ?  ?Problem: Elimination: ?Goal: Will not experience complications related to bowel motility ?08/20/2021 0537 by Charisse March, LPN ?Outcome: Progressing ?08/20/2021 0537 by Charisse March, LPN ?Outcome: Progressing ?Goal: Will not experience complications related to urinary retention ?08/20/2021 0537 by Charisse March, LPN ?Outcome: Progressing ?08/20/2021 0537 by Charisse March, LPN ?Outcome: Progressing ?  ?Problem: Pain Managment: ?Goal: General experience of comfort will improve ?08/20/2021 0537 by Charisse March, LPN ?Outcome: Progressing ?08/20/2021 0537 by Charisse March, LPN ?Outcome: Progressing ?  ?Problem: Safety: ?Goal: Ability to remain free from injury will improve ?08/20/2021 0537 by Charisse March, LPN ?Outcome: Progressing ?08/20/2021 0537 by Charisse March, LPN ?Outcome: Progressing ?  ?Problem: Skin Integrity: ?Goal: Risk for impaired skin integrity will decrease ?08/20/2021 0537 by Charisse March, LPN ?Outcome: Progressing ?08/20/2021 0537 by Charisse March, LPN ?Outcome: Progressing ?  ?

## 2021-08-21 ENCOUNTER — Encounter: Payer: Self-pay | Admitting: Orthopaedic Surgery

## 2021-08-21 LAB — RESP PANEL BY RT-PCR (FLU A&B, COVID) ARPGX2
Influenza A by PCR: NEGATIVE
Influenza B by PCR: NEGATIVE
SARS Coronavirus 2 by RT PCR: NEGATIVE

## 2021-08-21 NOTE — Progress Notes (Signed)
Physical Therapy Treatment ?Patient Details ?Name: Joseph Graham ?MRN: [id number] ?DOB: [birth year] ?Today's Date: 08/21/2021 ? ? ?History of Present Illness Patient is an 81 year old male s/p left hip hemi arthroplasty. PMH (+) for chronic hypoxic respiratory failure on 5 L oxygen, hypertension, B12 deficiency anemia, lung cancer and bladder cancer in remission. ? ?  ?PT Comments  ? ? Pt was pleasant and motivated to participate during the session and put forth good effort throughout. Pt required grossly less physical assistance with all functional tasks this session most notably during gait training where pt was able to amb at the EOB and to the chair without the need of physical assistance to prevent LOB.  Pt continued to require cuing for posterior hip precaution compliance during functional tasks but did show some carryover from prior session.  Pt will benefit from PT services in a SNF setting upon discharge to safely address deficits listed in patient problem list for decreased caregiver assistance and eventual return to PLOF. ? ?   ?Recommendations for follow up therapy are one component of a multi-disciplinary discharge planning process, led by the attending physician.  Recommendations may be updated based on patient status, additional functional criteria and insurance authorization. ? ?Follow Up Recommendations ? Skilled nursing-short term rehab (<3 hours/day) ?  ?  ?Assistance Recommended at Discharge Frequent or constant Supervision/Assistance  ?Patient can return home with the following Two people to help with walking and/or transfers;Assistance with feeding;Assist for transportation;Help with stairs or ramp for entrance;A lot of help with bathing/dressing/bathroom;Assistance with cooking/housework ?  ?Equipment Recommendations ? BSC/3in1  ?  ?Recommendations for Other Services   ? ? ?  ?Precautions / Restrictions Precautions ?Precautions: Posterior Hip;Fall ?Restrictions ?Weight Bearing Restrictions:  Yes ?LLE Weight Bearing: Weight bearing as tolerated  ?  ? ?Mobility ? Bed Mobility ?Overal bed mobility: Needs Assistance ?Bed Mobility: Supine to Sit, Sit to Supine ?  ?  ?Supine to sit: Mod assist, HOB elevated ?Sit to supine: Mod assist, +2 for physical assistance ?  ?General bed mobility comments: Physical assist for LLE and trunk control ?  ? ?Transfers ?Overall transfer level: Needs assistance ?Equipment used: Rolling walker (2 wheels) ?Transfers: Sit to/from Stand ?Sit to Stand: Mod assist, From elevated surface ?  ?  ?  ?  ?  ?General transfer comment: Mod verbal and tactile cues for sequencing for hip precaution compliance ?  ? ?Ambulation/Gait ?Ambulation/Gait assistance: Min guard ?Gait Distance (Feet): 4 Feet ?Assistive device: Rolling walker (2 wheels) ?Gait Pattern/deviations: Step-to pattern, Decreased stance time - left ?Gait velocity: decreased ?  ?  ?General Gait Details: Mod verbal and visual cues for sequencing but no LOB this session ? ? ?Stairs ?  ?  ?  ?  ?  ? ? ?Wheelchair Mobility ?  ? ?Modified Rankin (Stroke Patients Only) ?  ? ? ?  ?Balance Overall balance assessment: Needs assistance ?Sitting-balance support: No upper extremity supported, Feet supported ?Sitting balance-Leahy Scale: Fair ?  ?Postural control: Posterior lean ?Standing balance support: Bilateral upper extremity supported, Reliant on assistive device for balance, During functional activity ?Standing balance-Leahy Scale: Fair ?  ?  ?  ?  ?  ?  ?  ?  ?  ?  ?  ?  ?  ? ?  ?Cognition Arousal/Alertness: Awake/alert ?Behavior During Therapy: [facility] for tasks assessed/performed ?Overall Cognitive Status: Within Functional Limits for tasks assessed ?  ?  ?  ?  ?  ?  ?  ?  ?  ?  ?  ?  ?  ?  ?  ?  ?  ?  ?  ? ?  ?  Exercises Total Joint Exercises ?Ankle Circles/Pumps: AROM, Strengthening, Both, 10 reps ?Quad Sets: 10 reps, Both, Strengthening ?Gluteal Sets: Strengthening, Both, 10 reps ?Long Arc Quad: AROM, Strengthening, Both, 10  reps ?Knee Flexion: AROM, Strengthening, Both, 10 reps ?Marching in Standing: AROM, Strengthening, Both, 5 reps, Standing ?Other Exercises ?Other Exercises: Posterior hip precaution education with pt and spouse during functional tasks ?Other Exercises: HEP education per handout ? ?  ?General Comments   ?  ?  ? ?Pertinent Vitals/Pain Pain Assessment ?Pain Assessment: 0-10 ?Pain Score: 1  ?Pain Location: L hip ?Pain Descriptors / Indicators: Sore ?Pain Intervention(s): Repositioned, Premedicated before session, Monitored during session  ? ? ?Home Living   ?  ?  ?  ?  ?  ?  ?  ?  ?  ?   ?  ?Prior Function    ?  ?  ?   ? ?PT Goals (current goals can now be found in the care plan section) Progress towards PT goals: Progressing toward goals ? ?  ?Frequency ? ? ? BID ? ? ? ?  ?PT Plan Current plan remains appropriate  ? ? ?Co-evaluation   ?  ?  ?  ?  ? ?  ?AM-PAC PT "6 Clicks" Mobility   ?Outcome Measure ? Help needed turning from your back to your side while in a flat bed without using bedrails?: A Lot ?Help needed moving from lying on your back to sitting on the side of a flat bed without using bedrails?: A Lot ?Help needed moving to and from a bed to a chair (including a wheelchair)?: A Lot ?Help needed standing up from a chair using your arms (e.g., wheelchair or bedside chair)?: A Lot ?Help needed to walk in hospital room?: Total ?Help needed climbing 3-5 steps with a railing? : Total ?6 Click Score: 10 ? ?  ?End of Session Equipment Utilized During Treatment: Gait belt;Oxygen ?Activity Tolerance: Patient tolerated treatment well ?Patient left: in chair;with call bell/phone within reach;with chair alarm set;with SCD's reapplied ?Nurse Communication: Mobility status;Weight bearing status;Precautions ?PT Visit Diagnosis: Unsteadiness on feet (R26.81);Muscle weakness (generalized) (M62.81);Difficulty in walking, not elsewhere classified (R26.2);Pain ?Pain - Right/Left: Left ?Pain - part of body: Hip ?  ? ? ?Time:  3785-8850 ?PT Time Calculation (min) (ACUTE ONLY): 39 min ? ?Charges:  $Gait Training: 8-22 mins ?$Therapeutic Exercise: 8-22 mins ?$Therapeutic Activity: 8-22 mins          ?          ?D. Royetta Asal PT, DPT ?08/21/21, 4:49 PM ? ? ?

## 2021-08-21 NOTE — Progress Notes (Signed)
[place] at [facility] ? ? ?PATIENT NAME: Joseph Graham   ? ?MR#:  [id number] ? ?DATE OF BIRTH:  24-May-1941 ? ?SUBJECTIVE:  ?work with physical therapy still requires two persons assist. ?Wife at bedside ?I discussed patients Neurontin and pain medication regimen. ?Adjusted the time for Neurontin per his request ? ?discussed rehab and patient wants to discuss with his wife ?VITALS:  ?Blood pressure (!) 127/94, pulse 85, temperature 97.6 ?F (36.4 ?C), resp. rate 16, height 5\' 8"  (1.727 m), weight 72.6 kg, SpO2 95 %. ? ?PHYSICAL EXAMINATION:  ? ?GENERAL:  81 y.o.-year-old patient lying in the bed with no acute distress. ?LUNGS: Normal breath sounds bilaterally, no wheezing, rales, rhonchi.  ?CARDIOVASCULAR: S1, S2 normal. No murmurs, rubs, or gallops.  ?ABDOMEN: Soft, nontender, nondistended. Bowel sounds present.  ?EXTREMITIES: No  edema b/l.   Left hip dressing+ ?NEUROLOGIC: nonfocal  patient is alert and awake Essential tremor oral and hands ?SKIN: No obvious rash, lesion, or ulcer.  ? ?LABORATORY PANEL:  ?CBC ?Recent Labs  ?Lab 08/19/21 ?6073  ?WBC 11.9*  ?HGB 9.7*  ?HCT 30.3*  ?PLT 224  ? ? ? ?Chemistries  ?Recent Labs  ?Lab 08/18/21 ?1320 08/19/21 ?7106  ?NA 140 138  ?K 4.3 3.9  ?CL 99 99  ?CO2 30 28  ?GLUCOSE 75 97  ?BUN 33* 27*  ?CREATININE 0.90 0.69  ?CALCIUM 9.2 9.0  ?AST 41  --   ?ALT 19  --   ?ALKPHOS 73  --   ?BILITOT 1.1  --   ? ? ?Cardiac Enzymes ?No results for input(s): TROPONINI in the last 168 hours. ?RADIOLOGY:  ?No results found. ? ?Assessment and Plan ?MALYK GIROUARD is a 82 y.o. male with history of severe COPD with chronic hypoxic respiratory failure on 5 L oxygen, hypertension, B12 deficiency anemia, lung cancer and bladder cancer in remission, presents to the ER after patient had a fall yesterday.  Since the fall patient has been having left hip pain.  Patient states he was trying to wear his neck brace which he wears for neck arthritis when he slipped and fell  on his back.  Not hit his head or lose consciousness. ? ?Left hip fracture left displaced femoral neck fracture  ?status post left hemi arthroplasty  ?-- patient had mechanical fall at home ?--- patient is status post surgery by orthopedic Dr. Gaspar Bidding ?-- PRN pain meds ?-- physical therapy occupational therapy--recommends rehab.  ?-- Patient will follow-up with Dr. Kurtis Bushman orthopedic as outpatient for follow-up. ? ?chronic hypoxic respiratory failure secondary to severe COPD on chronic 5 L home oxygen ?-- continue nebs and PRN inhalers ? ?Hypertension ?-- resume lisinopril and beta-blockers ? ?chronic essential tremors ?chronic neuropathy ?-- patient would like to increase gabapentin 100 milligrams TID ? ?history of lung and bladder cancer ?-- not on any treatment ? ?TOC for discharge planning.  ?Will discharge to rehab once bed available. ? ? ?Procedures: Left hip hemi arthroplasty ?Family communication : wife at bedside ?Consults :ortho ?CODE STATUS: FULL ?DVT Prophylaxis : ?Level of care: Med-Surg ?Status is: Inpatient ?Remains inpatient appropriate because: mechanical fall post  ? ? ?TOC to work for rehab placement. Discharge once bed available. ? ?TOTAL TIME TAKING CARE OF THIS PATIENT: 25 minutes.  ?>50% time spent on counselling and coordination of care ? ?Note: This dictation was prepared with Dragon dictation along with smaller phrase technology. Any transcriptional errors that result from this process are unintentional. ? ?Fritzi Mandes M.D  ? ? ?  [facility]  ? ?CC: ?Primary care physician; [name], MD  ?

## 2021-08-21 NOTE — [place] FL2 (Signed)
?[place] MEDICAID FL2 LEVEL OF CARE SCREENING TOOL  ?  ? ?IDENTIFICATION  ?Patient Name: ?Joseph Graham Birthdate: Feb 13, 1941 Sex: male Admission Date (Current Location): ?08/18/2021  ?[place] and [place] Number: ? [place] ?  Facility and Address:  ?[facility], [address] [place], [place] [address] ?     Provider Number: ?[id number]  ?Attending Physician Name and Address:  ?Fritzi Mandes, MD ? Relative Name and Phone Number:  ?Mardene Celeste [phone] ?   ?Current Level of Care: ?Hospital Recommended Level of Care: ?[place] Prior Approval Number: ?  ? ?Date Approved/Denied: ?  PASRR Number: ?[id number] A ? ?Discharge Plan: ?SNF ?  ? ?Current Diagnoses: ?Patient Active Problem List  ? Diagnosis Date Noted  ? Hip fracture ([place]) 08/18/2021  ? Acute pharyngitis 06/14/2021  ? Diverticulum of [place] 01/19/2021  ? Atherosclerosis of aorta ([place]) 01/19/2021  ? Stasis ulcer ([place]) 09/30/2020  ? Vitamin D deficiency 07/18/2020  ? Recurrent pneumothorax after pleural drain removed 04/26/2020  ? Chronic respiratory failure with hypoxia ([place]) 04/26/2020  ? Bullous emphysema ([place]) 04/26/2020  ? Grade 1 Retrolisthesis of L2/L3 11/25/2019  ? Abnormal CT scan, lumbar spine (01/22/2019) 11/25/2019  ? Osteoarthritis involving multiple joints 08/18/2019  ? Rhinitis medicamentosa 07/14/2019  ? Right bundle branch block (RBBB) with left anterior hemiblock 01/21/2019  ? Other specified spondylopathies, sacral and sacrococcygeal region [facility]) 01/15/2019  ? Spondylosis without myelopathy or radiculopathy, lumbosacral region 08/28/2018  ? Abnormal MRI, lumbar spine (06/12/2018) 08/20/2018  ? DDD (degenerative disc disease), lumbosacral 08/07/2018  ? Lumbar facet hypertrophy (Multilevel) (Bilateral) 08/04/2018  ? Osteoarthritis of facet joint of lumbar spine 08/04/2018  ? Lumbar spondylosis 08/04/2018  ? Lumbar facet syndrome (Bilateral) (L>R) 08/04/2018  ? Lumbar lateral recess stenosis (L2-3,  L3-4, L4-5) (Right) 08/04/2018  ? Lumbar foraminal stenosis (Right: L2-3, L3-4) (Bilateral: L4-5) 08/04/2018  ? Chronic low back pain (Primary area of Pain) (Bilateral)  (L>R) w/o sciatica 07/14/2018  ? Chronic coccyx pain (Secondary Area of Pain) (Midline) 07/14/2018  ? Chronic lower extremity pain [facility]) (Bilateral) (R>L) 07/14/2018  ? Chronic pain syndrome 07/14/2018  ? Long term current use of opiate analgesic 07/14/2018  ? Polypharmacy 07/14/2018  ? Disorder of skeletal system 07/14/2018  ? Problems influencing health status 07/14/2018  ? HLD (hyperlipidemia) 07/10/2018  ? DDD (degenerative disc disease), lumbar 07/10/2018  ? Macular degeneration of both eyes 10/01/2017  ? Unintentional weight loss 07/30/2016  ? Mixed sensory-motor polyneuropathy 07/30/2016  ? Vitamin B12 deficiency 07/30/2016  ? Peripheral neuropathy due to chemotherapy ([place]) 12/23/2015  ? Malignant neoplasm of right upper lobe of lung ([place]) 12/20/2015  ? Pedal edema 12/09/2015  ? COPD, severe ([place])   ? Primary cancer of bladder ([place])   ? PAD (peripheral artery disease) ([place]) 03/01/2015  ? Coronary artery disease involving native coronary artery of native heart without angina pectoris 03/01/2015  ? Health maintenance examination 01/05/2014  ? Advanced care planning/counseling discussion 01/05/2014  ? Controlled diabetes mellitus type 2 with complications ([place]) 58/02/9832  ? Benign prostatic hyperplasia 12/30/2012  ? Special screening for malignant neoplasms, colon 07/31/2011  ? Medicare annual wellness visit, subsequent 07/31/2011  ? Benign essential tremor   ? Personal history of colonic polyps - adenomas   ? HTN (hypertension)   ? Ex-smoker   ? Cardiac arrhythmia 03/11/2010  ? ? ?Orientation RESPIRATION BLADDER Height & Weight   ?  ?Self, Time, Situation, Place ? Normal, O2 (2 liters) Continent Weight: 72.6 kg ?Height:  5\' 8"  (172.7 cm)  ?BEHAVIORAL SYMPTOMS/MOOD NEUROLOGICAL BOWEL NUTRITION STATUS  ?    Continent Diet (see  dc summary)  ?AMBULATORY STATUS COMMUNICATION OF NEEDS Skin   ?Extensive Assist Verbally Normal, Surgical wounds ?  ?  ?  ?    ?     ?     ? ? ?Personal Care Assistance Level of Assistance  ?Dressing, Feeding, Bathing Bathing Assistance: Limited assistance ?Feeding assistance: Independent ?Dressing Assistance: Limited assistance ?   ? ?Functional Limitations Info  ?    ?  ?   ? ? ?SPECIAL CARE FACTORS FREQUENCY  ?PT (By licensed PT), OT (By licensed OT)   ?  ?PT Frequency: 5 times per week ?OT Frequency: 5 times per week ?  ?  ?  ?   ? ? ?Contractures Contractures Info: Not present  ? ? ?Additional Factors Info  ?Code Status, Allergies Code Status Info: full code ?Allergies Info: NKDA ?  ?  ?  ?   ? ?Current Medications (08/21/2021):  This is the current hospital active medication list ?Current Facility-Administered Medications  ?Medication Dose Route Frequency Provider Last Rate Last Admin  ? acetaminophen (TYLENOL) tablet 650 mg  650 mg Oral Q6H PRN Fritzi Mandes, MD      ? albuterol (PROVENTIL) (2.5 MG/3ML) 0.083% nebulizer solution 3 mL  3 mL Nebulization Q6H PRN Fritzi Mandes, MD      ? aspirin chewable tablet 81 mg  81 mg Oral Daily Fritzi Mandes, MD   81 mg at 08/21/21 0849  ? Chlorhexidine Gluconate Cloth 2 % PADS 6 each  6 each Topical Daily Fritzi Mandes, MD   6 each at 08/21/21 0849  ? feeding supplement (ENSURE ENLIVE / ENSURE PLUS) liquid 237 mL  237 mL Oral BID BM Rise Patience, MD   237 mL at 08/21/21 0900  ? fluticasone furoate-vilanterol (BREO ELLIPTA) 100-25 MCG/ACT 1 puff  1 puff Inhalation Daily Fritzi Mandes, MD   1 puff at 08/21/21 0854  ? And  ? umeclidinium bromide (INCRUSE ELLIPTA) 62.5 MCG/ACT 1 puff  1 puff Inhalation Daily Fritzi Mandes, MD   1 puff at 08/21/21 0853  ? gabapentin (NEURONTIN) capsule 100 mg  100 mg Oral TID Fritzi Mandes, MD   100 mg at 08/21/21 0849  ? hydrALAZINE (APRESOLINE) injection 10 mg  10 mg Intravenous Q6H PRN Rise Patience, MD   10 mg at 08/18/21 2141  ?  HYDROcodone-acetaminophen (NORCO) 7.5-325 MG per tablet 1-2 tablet  1-2 tablet Oral Q4H PRN Fritzi Mandes, MD   2 tablet at 08/21/21 [id number]  ? lisinopril (ZESTRIL) tablet 10 mg  10 mg Oral QPM Fritzi Mandes, MD   10 mg at 08/20/21 1751  ? methocarbamol (ROBAXIN) tablet 500 mg  500 mg Oral Q6H PRN Rise Patience, MD   500 mg at 08/19/21 2054  ? Or  ? methocarbamol (ROBAXIN) 500 mg in dextrose 5 % 50 mL IVPB  500 mg Intravenous Q6H PRN Rise Patience, MD      ? metoprolol tartrate (LOPRESSOR) tablet 25 mg  25 mg Oral QPM Fritzi Mandes, MD   25 mg at 08/20/21 1752  ? morphine (PF) 2 MG/ML injection 2 mg  2 mg Intravenous Q4H PRN Fritzi Mandes, MD      ? multivitamin with minerals tablet 1 tablet  1 tablet Oral Daily Rise Patience, MD   1 tablet at 08/21/21 0849  ? mupirocin ointment (BACTROBAN) 2 % 1 application.  1 application.  Nasal BID Rise Patience, MD   1 application. at 08/21/21 0853  ? pantoprazole (PROTONIX) EC tablet 40 mg  40 mg Oral Daily Fritzi Mandes, MD   40 mg at 08/21/21 0848  ? polyethylene glycol (MIRALAX / GLYCOLAX) packet 17 g  17 g Oral Daily Fritzi Mandes, MD   17 g at 08/21/21 0849  ? vitamin B-12 (CYANOCOBALAMIN) tablet 100 mcg  100 mcg Oral QPM Fritzi Mandes, MD   100 mcg at 08/20/21 1752  ? vitamin E capsule 400 Units  400 Units Oral QPM Fritzi Mandes, MD   400 Units at 08/20/21 1752  ? ? ? ?Discharge Medications: ?Please see discharge summary for a list of discharge medications. ? ?Relevant Imaging Results: ? ?Relevant Lab Results: ? ? ?Additional Information ?SS# [id number] ? ?Conception Oms, RN ? ? ? ? ?

## 2021-08-21 NOTE — TOC Progression Note (Signed)
Transition of Care (TOC) - Progression Note  ? ? ?Patient Details  ?Name: Joseph Graham ?MRN: [id number] ?Date of Birth: 1940-10-18 ? ?Transition of Care (TOC) CM/SW Contact  ?Conception Oms, RN ?Phone Number: ?08/21/2021, 2:39 PM ? ?Clinical Narrative:   Ins auth approved 3/14-3/16 [id number] to go to [facility] ? ? ? ?Expected Discharge Plan: [place] ?Barriers to Discharge: Continued Medical Work up ? ?Expected Discharge Plan and Services ?Expected Discharge Plan: [place] ?In-house Referral: Clinical Social Work ?  ?Post Acute Care Choice: [place] ?Living arrangements for the past 2 months: [place] ?                ?  ?  ?  ?  ?  ?  ?  ?  ?  ?  ? ? ?Social Determinants of Health (SDOH) Interventions ?  ? ?Readmission Risk Interventions ?No flowsheet data found. ? ?

## 2021-08-21 NOTE — TOC Progression Note (Signed)
Transition of Care (TOC) - Progression Note  ? ? ?Patient Details  ?Name: Joseph Graham ?MRN: [id number] ?Date of Birth: 1940-12-17 ? ?Transition of Care (TOC) CM/SW Contact  ?Conception Oms, RN ?Phone Number: ?08/21/2021, 2:31 PM ? ?Clinical Narrative:   Reviewed the bed offers with the patienbt and his wife, [facility], I notified Miquel Dunn and started the ins auth process ? ? ? ?Expected Discharge Plan: [place] ?Barriers to Discharge: Continued Medical Work up ? ?Expected Discharge Plan and Services ?Expected Discharge Plan: [place] ?In-house Referral: Clinical Social Work ?  ?Post Acute Care Choice: [place] ?Living arrangements for the past 2 months: [place] ?                ?  ?  ?  ?  ?  ?  ?  ?  ?  ?  ? ? ?Social Determinants of Health (SDOH) Interventions ?  ? ?Readmission Risk Interventions ?No flowsheet data found. ? ?

## 2021-08-21 NOTE — Progress Notes (Signed)
Physical Therapy Treatment ?Patient Details ?Name: Joseph Graham ?MRN: [id number] ?DOB: 06-02-1941 ?Today's Date: 08/21/2021 ? ? ?History of Present Illness Patient is an 81 year old male s/p left hip hemi arthroplasty. PMH (+) for chronic hypoxic respiratory failure on 5 L oxygen, hypertension, B12 deficiency anemia, lung cancer and bladder cancer in remission. ? ?  ?PT Comments  ? ? Pt was pleasant and motivated to participate during the session and put forth good effort throughout. Pt required extensive time with all functional tasks required by pt to minimize anxiety and increased UE tremors.  Pt required physical assistance, often +2, to ensure pt safety and posterior hip precaution compliance.  Pt presented with posterior instability in standing and frequently required assist to prevent LOB. Pt is at a very high risk for falls and would not be safe to return to his prior living situation at this time.  Pt will benefit from PT services in a SNF setting upon discharge to safely address deficits listed in patient problem list for decreased caregiver assistance and eventual return to PLOF. ? ?   ?Recommendations for follow up therapy are one component of a multi-disciplinary discharge planning process, led by the attending physician.  Recommendations may be updated based on patient status, additional functional criteria and insurance authorization. ? ?Follow Up Recommendations ? Skilled nursing-short term rehab (<3 hours/day) ?  ?  ?Assistance Recommended at Discharge Frequent or constant Supervision/Assistance  ?Patient can return home with the following Two people to help with walking and/or transfers;Assistance with feeding;Assist for transportation;Help with stairs or ramp for entrance;A lot of help with bathing/dressing/bathroom;Assistance with cooking/housework ?  ?Equipment Recommendations ? BSC/3in1  ?  ?Recommendations for Other Services   ? ? ?  ?Precautions / Restrictions Precautions ?Precautions:  Posterior Hip;Fall ?Restrictions ?Weight Bearing Restrictions: Yes ?LLE Weight Bearing: Weight bearing as tolerated  ?  ? ?Mobility ? Bed Mobility ?Overal bed mobility: Needs Assistance ?Bed Mobility: Supine to Sit, Sit to Supine ?  ?  ?Supine to sit: Mod assist, HOB elevated ?Sit to supine: Mod assist, +2 for physical assistance ?  ?General bed mobility comments: Physical assist for LLE and trunk control ?  ? ?Transfers ?Overall transfer level: Needs assistance ?Equipment used: Rolling walker (2 wheels) ?Transfers: Sit to/from Stand ?Sit to Stand: Mod assist, From elevated surface ?  ?  ?  ?  ?  ?General transfer comment: Max verbal and tactile cues for sequencing for hip precaution compliance ?  ? ?Ambulation/Gait ?Ambulation/Gait assistance: Mod assist ?Gait Distance (Feet): 3 Feet ?Assistive device: Rolling walker (2 wheels) ?Gait Pattern/deviations: Step-to pattern, Decreased stance time - left ?Gait velocity: decreased ?  ?  ?General Gait Details: Occasional mod A to prevent posterior LOB; mod verbal and visual cues for sequencing ? ? ?Stairs ?  ?  ?  ?  ?  ? ? ?Wheelchair Mobility ?  ? ?Modified Rankin (Stroke Patients Only) ?  ? ? ?  ?Balance Overall balance assessment: Needs assistance ?Sitting-balance support: No upper extremity supported, Feet supported ?Sitting balance-Leahy Scale: Fair ?  ?Postural control: Posterior lean ?Standing balance support: Bilateral upper extremity supported, Reliant on assistive device for balance, During functional activity ?Standing balance-Leahy Scale: Poor ?  ?  ?  ?  ?  ?  ?  ?  ?  ?  ?  ?  ?  ? ?  ?Cognition Arousal/Alertness: Awake/alert ?Behavior During Therapy: Anxious ?Overall Cognitive Status: Within Functional Limits for tasks assessed ?  ?  ?  ?  ?  ?  ?  ?  ?  ?  ?  ?  ?  ?  ?  ?  ?  ?  ?  ? ?  ?  Exercises Total Joint Exercises ?Long Arc Quad: AROM, Strengthening, Both, 5 reps, 10 reps ?Knee Flexion: AROM, Strengthening, Both, 10 reps, 5 reps ?Marching in  Standing: AROM, Strengthening, Both, 5 reps, Standing ?Other Exercises ?Other Exercises: Posterior hip precaution education with pt and spouse verbally and during functional tasks ?Other Exercises: Positioning education creating abd wedge with pillows with heels floated ? ?  ?General Comments   ?  ?  ? ?Pertinent Vitals/Pain Pain Assessment ?Pain Assessment: 0-10 ?Pain Score: 1  ?Pain Location: L hip ?Pain Descriptors / Indicators: Sore ?Pain Intervention(s): Premedicated before session, Monitored during session, Repositioned  ? ? ?Home Living Family/patient expects to be discharged to:: Private residence ?Living Arrangements: Spouse/significant other (Wife) ?Available Help at Discharge: Family;Available 24 hours/day;Available PRN/intermittently (Wife can help some (24/7), daughter and grandson live down the street and can help PRN) ?Type of Home: House ?Home Access: Stairs to enter ?Entrance Stairs-Rails: None ?Entrance Stairs-Number of Steps: 1 ?Alternate Level Stairs-Number of Steps: 1 flight ?Home Layout: Two level ?Home Equipment: Conservation officer, nature (2 wheels);Rollator (4 wheels);Grab bars - toilet;Grab bars - tub/shower;Shower seat;Hand held shower head;Adaptive equipment ?   ?  ?Prior Function    ?  ?  ?   ? ?PT Goals (current goals can now be found in the care plan section) Progress towards PT goals: Progressing toward goals ? ?  ?Frequency ? ? ? BID ? ? ? ?  ?PT Plan Current plan remains appropriate  ? ? ?Co-evaluation   ?  ?  ?  ?  ? ?  ?AM-PAC PT "6 Clicks" Mobility   ?Outcome Measure ? Help needed turning from your back to your side while in a flat bed without using bedrails?: A Lot ?Help needed moving from lying on your back to sitting on the side of a flat bed without using bedrails?: A Lot ?Help needed moving to and from a bed to a chair (including a wheelchair)?: A Lot ?Help needed standing up from a chair using your arms (e.g., wheelchair or bedside chair)?: A Lot ?Help needed to walk in hospital room?:  Total ?Help needed climbing 3-5 steps with a railing? : Total ?6 Click Score: 10 ? ?  ?End of Session Equipment Utilized During Treatment: Gait belt;Oxygen ?Activity Tolerance: Patient tolerated treatment well ?Patient left: in bed;with call bell/phone within reach;with bed alarm set;with SCD's reapplied ?Nurse Communication: Mobility status;Weight bearing status ?PT Visit Diagnosis: Unsteadiness on feet (R26.81);Muscle weakness (generalized) (M62.81);Difficulty in walking, not elsewhere classified (R26.2);Pain ?Pain - Right/Left: Left ?Pain - part of body: Hip ?  ? ? ?Time: 3953-2023 ?PT Time Calculation (min) (ACUTE ONLY): 44 min ? ?Charges:  $Gait Training: 8-22 mins ?$Therapeutic Exercise: 8-22 mins ?$Therapeutic Activity: 8-22 mins          ?          ? ?D. Royetta Asal PT, DPT ?08/21/21, 1:37 PM ? ? ?

## 2021-08-21 NOTE — Plan of Care (Signed)

## 2021-08-21 NOTE — TOC Progression Note (Signed)
Transition of Care (TOC) - Progression Note  ? ? ?Patient Details  ?Name: Joseph Graham ?MRN: [id number] ?Date of Birth: February 07, 1941 ? ?Transition of Care (TOC) CM/SW Contact  ?Conception Oms, RN ?Phone Number: ?08/21/2021, 11:59 AM ? ?Clinical Narrative:   Met with the patient and his wife in the room, He is agreeable to go to [facility] SNF, [place] sent will review once offers obtained ? ? ? ?Expected Discharge Plan: [place] ?Barriers to Discharge: Continued Medical Work up ? ?Expected Discharge Plan and Services ?Expected Discharge Plan: [place] ?In-house Referral: Clinical Social Work ?  ?Post Acute Care Choice: [place] ?Living arrangements for the past 2 months: [place] ?                ?  ?  ?  ?  ?  ?  ?  ?  ?  ?  ? ? ?Social Determinants of Health (SDOH) Interventions ?  ? ?Readmission Risk Interventions ?No flowsheet data found. ? ?

## 2021-08-21 NOTE — Evaluation (Signed)
Occupational Therapy Evaluation Patient Details Name: Joseph Graham MRN: [id number] DOB: 01-31-1941 Today's Date: 08/21/2021   History of Present Illness Patient is an 81 year old male s/p left hip hemi arthroplasty. PMH (+) for chronic hypoxic respiratory failure on 5 L oxygen, hypertension, B12 deficiency anemia, lung cancer and bladder cancer in remission.   Clinical Impression   Pt seen for OT evaluation this date, POD#2 from above surgery. Prior to admission, pt was MOD-I for dressing, bathing, and functional mobility, and was living with wife in a 2-level home with 1 STE. Per chart review, pt was receiving some assistance for feeding at baseline d/t intention tremors. Pt uses 3-5L/min of supplemental O2 at baseline, and reports that he fell when trying to manage O2 line during shower transfer. Pt was able to recall 2/3 posterior total hip precautions at start of session. Pt re-educated in all posterior total hip precautions and educated on how to implement precautions during self care skills, falls prevention strategies, home/routines modifications, and DME/AE for LB bathing and dressing tasks. Handout provided. Pt currently requires MIN-MOD A for bed mobility (with HOB elevated), MOD A for seated LB dressing with AD, MAX A for sit>stand transfers, and MOD A to take lateral steps toward [facility] d/t pain and limited AROM of L hip. While seated EOB, pt required only SET-UP assist to reach within BOS to grab cup, bring to mouth, and drink via straw. At end of session, pt returned to supine, on 3L of O2, in no acute distress. Pt would benefit from additional instruction in self care skills and techniques to help maintain precautions with or without assistive devices to support recall and carryover prior to discharge. Recommend SNF upon discharge.       Recommendations for follow up therapy are one component of a multi-disciplinary discharge planning process, led by the attending physician.   Recommendations may be updated based on patient status, additional functional criteria and insurance authorization.   Follow Up Recommendations  Skilled nursing-short term rehab (<3 hours/day)    Assistance Recommended at Discharge Intermittent Supervision/Assistance  Patient can return home with the following A lot of help with walking and/or transfers;A lot of help with bathing/dressing/bathroom    Functional Status Assessment  Patient has had a recent decline in their functional status and demonstrates the ability to make significant improvements in function in a reasonable and predictable amount of time.  Equipment Recommendations  Other (comment) (defer to next venue of care)       Precautions / Restrictions Precautions Precautions: Posterior Hip;Fall Restrictions Weight Bearing Restrictions: Yes LLE Weight Bearing: Weight bearing as tolerated      Mobility Bed Mobility Overal bed mobility: Needs Assistance Bed Mobility: Supine to Sit, Sit to Supine     Supine to sit: Min assist, HOB elevated Sit to supine: Mod assist   General bed mobility comments: Requires MIN A for trunk support during supine>sit with use of bedrails. Requires MOD A for LE management during sit>supine. Requires cues for sequencing    Transfers Overall transfer level: Needs assistance Equipment used: Rolling walker (2 wheels) Transfers: Sit to/from Stand Sit to Stand: Max assist, From elevated surface                  Balance Overall balance assessment: Needs assistance Sitting-balance support: No upper extremity supported, Feet supported Sitting balance-Leahy Scale: Fair Sitting balance - Comments: Able to maintain balance during seated LB dressing with AD   Standing balance support: Bilateral upper extremity  supported, Reliant on assistive device for balance, During functional activity Standing balance-Leahy Scale: Poor Standing balance comment: Requires MOD A for taking lateral steps  at EOB                           ADL either performed or assessed with clinical judgement   ADL Overall ADL's : Needs assistance/impaired Eating/Feeding: Set up;Supervision/ safety;Sitting Eating/Feeding Details (indicate cue type and reason): While sitting EOB, able to reach within BOS to grab cup, bring to mouth, and drink via straw                 Lower Body Dressing: Moderate assistance;Sitting/lateral leans Lower Body Dressing Details (indicate cue type and reason): While seated EOB, pt able to doff socks with reacher. Requires assistance to don socks                     Vision Baseline Vision/History: 1 Wears glasses Ability to See in Adequate Light: 0 Adequate Patient Visual Report: No change from baseline              Pertinent Vitals/Pain Pain Assessment Pain Assessment: No/denies pain     Hand Dominance Left   Extremity/Trunk Assessment Upper Extremity Assessment Upper Extremity Assessment: Generalized weakness   Lower Extremity Assessment Lower Extremity Assessment: Generalized weakness;LLE deficits/detail LLE Deficits / Details: Decreased strength and ROM due to hip precacutions and pain LLE Sensation: history of peripheral neuropathy       Communication Communication Communication: No difficulties   Cognition Arousal/Alertness: Awake/alert Behavior During Therapy: WFL for tasks assessed/performed, Anxious Overall Cognitive Status: Within Functional Limits for tasks assessed                                                  Home Living Family/patient expects to be discharged to:: Private residence Living Arrangements: Spouse/significant other (Wife) Available Help at Discharge: Family;Available 24 hours/day;Available PRN/intermittently (Wife can help some (24/7), daughter and grandson live down the street and can help PRN) Type of Home: House Home Access: Stairs to enter Technical brewer of Steps:  1 Entrance Stairs-Rails: None Home Layout: Two level Alternate Level Stairs-Number of Steps: 1 flight Alternate Level Stairs-Rails: Right;Left;Can reach both Bathroom Shower/Tub: Occupational psychologist: Handicapped height     Home Equipment: Conservation officer, nature (2 wheels);Rollator (4 wheels);Grab bars - toilet;Grab bars - tub/shower;Shower seat;Hand held shower head;Adaptive equipment Adaptive Equipment: Reacher;Sock aid;Long-handled sponge        Prior Functioning/Environment Prior Level of Function : Needs assist       Physical Assist : ADLs (physical)   ADLs (physical): Feeding Mobility Comments: Mod I with ambulation, RW or rollator ADLs Comments: Mod-independent with dressing and bathing. Per chart review, requires some assistance with feeding d/t tremors        OT Problem List: Decreased strength;Decreased range of motion;Decreased activity tolerance;Impaired balance (sitting and/or standing);Decreased knowledge of precautions;Pain      OT Treatment/Interventions: Self-care/ADL training;Therapeutic exercise;Energy conservation;DME and/or AE instruction;Therapeutic activities;Patient/family education;Balance training    OT Goals(Current goals can be found in the care plan section) Acute Rehab OT Goals Patient Stated Goal: to regain independence OT Goal Formulation: With patient Time For Goal Achievement: 09/04/21 Potential to Achieve Goals: Good ADL Goals Pt Will Perform Upper Body Dressing: with set-up;sitting Pt Will  Perform Lower Body Dressing: with supervision;with adaptive equipment;sitting/lateral leans Pt Will Transfer to Toilet: with mod assist;stand pivot transfer;bedside commode  OT Frequency: Min 2X/week       AM-PAC OT "6 Clicks" Daily Activity     Outcome Measure Help from another person eating meals?: A Little Help from another person taking care of personal grooming?: A Little Help from another person toileting, which includes using toliet,  bedpan, or urinal?: A Lot Help from another person bathing (including washing, rinsing, drying)?: A Lot Help from another person to put on and taking off regular upper body clothing?: A Little Help from another person to put on and taking off regular lower body clothing?: A Lot 6 Click Score: 15   End of Session Equipment Utilized During Treatment: Rolling walker (2 wheels);Oxygen Nurse Communication: Mobility status  Activity Tolerance: Patient tolerated treatment well Patient left: in bed;with call bell/phone within reach;with bed alarm set;with family/visitor present  OT Visit Diagnosis: Unsteadiness on feet (R26.81);History of falling (Z91.81);Pain Pain - Right/Left: Left Pain - part of body: Hip                Time: 1115-1208 OT Time Calculation (min): 53 min Charges:  OT General Charges $OT Visit: 1 Visit OT Evaluation $OT Eval Moderate Complexity: 1 Mod OT Treatments $Self Care/Home Management : 38-52 mins  Fredirick Maudlin, OTR/L [place]

## 2021-08-22 DIAGNOSIS — R2689 Other abnormalities of gait and mobility: Secondary | ICD-10-CM | POA: Diagnosis not present

## 2021-08-22 DIAGNOSIS — G893 Neoplasm related pain (acute) (chronic): Secondary | ICD-10-CM | POA: Diagnosis not present

## 2021-08-22 DIAGNOSIS — G9009 Other idiopathic peripheral autonomic neuropathy: Secondary | ICD-10-CM | POA: Diagnosis not present

## 2021-08-22 DIAGNOSIS — R0602 Shortness of breath: Secondary | ICD-10-CM | POA: Diagnosis not present

## 2021-08-22 DIAGNOSIS — Z471 Aftercare following joint replacement surgery: Secondary | ICD-10-CM | POA: Diagnosis not present

## 2021-08-22 DIAGNOSIS — I2510 Atherosclerotic heart disease of native coronary artery without angina pectoris: Secondary | ICD-10-CM | POA: Diagnosis not present

## 2021-08-22 DIAGNOSIS — R1312 Dysphagia, oropharyngeal phase: Secondary | ICD-10-CM | POA: Diagnosis not present

## 2021-08-22 DIAGNOSIS — R6889 Other general symptoms and signs: Secondary | ICD-10-CM | POA: Diagnosis not present

## 2021-08-22 DIAGNOSIS — I10 Essential (primary) hypertension: Secondary | ICD-10-CM | POA: Diagnosis not present

## 2021-08-22 DIAGNOSIS — R278 Other lack of coordination: Secondary | ICD-10-CM | POA: Diagnosis not present

## 2021-08-22 DIAGNOSIS — E782 Mixed hyperlipidemia: Secondary | ICD-10-CM | POA: Diagnosis not present

## 2021-08-22 DIAGNOSIS — M545 Low back pain, unspecified: Secondary | ICD-10-CM | POA: Diagnosis not present

## 2021-08-22 DIAGNOSIS — J449 Chronic obstructive pulmonary disease, unspecified: Secondary | ICD-10-CM | POA: Diagnosis not present

## 2021-08-22 DIAGNOSIS — K219 Gastro-esophageal reflux disease without esophagitis: Secondary | ICD-10-CM | POA: Diagnosis not present

## 2021-08-22 DIAGNOSIS — E119 Type 2 diabetes mellitus without complications: Secondary | ICD-10-CM | POA: Diagnosis not present

## 2021-08-22 DIAGNOSIS — R0600 Dyspnea, unspecified: Secondary | ICD-10-CM | POA: Diagnosis not present

## 2021-08-22 DIAGNOSIS — S7292XA Unspecified fracture of left femur, initial encounter for closed fracture: Secondary | ICD-10-CM | POA: Diagnosis not present

## 2021-08-22 DIAGNOSIS — S72002A Fracture of unspecified part of neck of left femur, initial encounter for closed fracture: Secondary | ICD-10-CM | POA: Diagnosis not present

## 2021-08-22 DIAGNOSIS — D649 Anemia, unspecified: Secondary | ICD-10-CM | POA: Diagnosis not present

## 2021-08-22 DIAGNOSIS — J441 Chronic obstructive pulmonary disease with (acute) exacerbation: Secondary | ICD-10-CM | POA: Diagnosis not present

## 2021-08-22 DIAGNOSIS — J95811 Postprocedural pneumothorax: Secondary | ICD-10-CM | POA: Diagnosis not present

## 2021-08-22 DIAGNOSIS — M6281 Muscle weakness (generalized): Secondary | ICD-10-CM | POA: Diagnosis not present

## 2021-08-22 DIAGNOSIS — G894 Chronic pain syndrome: Secondary | ICD-10-CM | POA: Diagnosis not present

## 2021-08-22 DIAGNOSIS — S72002D Fracture of unspecified part of neck of left femur, subsequent encounter for closed fracture with routine healing: Secondary | ICD-10-CM | POA: Diagnosis not present

## 2021-08-22 DIAGNOSIS — J9611 Chronic respiratory failure with hypoxia: Secondary | ICD-10-CM | POA: Diagnosis not present

## 2021-08-22 DIAGNOSIS — Z7401 Bed confinement status: Secondary | ICD-10-CM | POA: Diagnosis not present

## 2021-08-22 LAB — SURGICAL PATHOLOGY

## 2021-08-22 MED ORDER — HYDROCODONE-ACETAMINOPHEN 7.5-325 MG PO TABS: 1.0000 | ORAL_TABLET | ORAL | 0 refills | Status: DC | PRN

## 2021-08-22 MED ORDER — GABAPENTIN 100 MG PO CAPS: 100.0000 mg | ORAL_CAPSULE | Freq: Three times a day (TID) | ORAL | Status: DC

## 2021-08-22 NOTE — Hospital Course (Signed)
Joseph Graham is a 81 y.o. male with history of severe COPD with chronic hypoxic respiratory failure on 5 L oxygen, HTN, B12 deficiency anemia, lung cancer and bladder cancer in remission who presented to the ER after a fall.  ?He was found to have a left hip fracture and underwent left hip hemiarthroplasty on 08/19/21.  ?He was evaluated by PT with recommendations for rehab at discharge. ?

## 2021-08-22 NOTE — Discharge Summary (Signed)
?Physician Discharge Summary ?  ?Patient: Joseph Graham MRN: [id number] DOB: 1940/11/03  ?Admit date:     08/18/2021  ?Discharge date: 08/22/21  ?Discharge Physician: Joseph Graham  ? ?PCP: Joseph Bush, MD  ? ?Recommendations at discharge:  ? ? Follow up with orthopedic surgery for staple removal within 2 weeks ? ?Discharge Diagnoses: ?Principal Problem: ?  Hip fracture ([place]) ?Active Problems: ?  HTN (hypertension) ?  Benign essential tremor ?  COPD, severe ([place]) ?  Chronic respiratory failure with hypoxia ([facility]) ? ?Resolved Problems: ?  * No resolved hospital problems. * ? ?Hospital Course: ?Joseph Graham is a 81 y.o. male with history of severe COPD with chronic hypoxic respiratory failure on 5 L oxygen, HTN, B12 deficiency anemia, lung cancer and bladder cancer in remission who presented to the ER after a fall.  ?He was found to have a left hip fracture and underwent left hip hemiarthroplasty on 08/19/21.  ?He was evaluated by PT with recommendations for rehab at discharge. ? ?Assessment and Plan: ? ?Left hip fracture left displaced femoral neck fracture  ?status post left hemiarthroplasty  ?-- s/p mechanical fall at home ?--- patient is status post surgery by orthopedic Dr. Gaspar Graham on 08/19/21 ?- continue pain control ?- rehab at discharge ?- aspirin for DVT ppx at discharge  ?-- PRN pain meds ?-- physical therapy occupational therapy--recommends rehab.  ?-- Patient will follow-up with [facility] Graham orthopedic as outpatient for follow-up. ?  ?Chronic hypoxic respiratory failure secondary to severe COPD on chronic 5 L home oxygen ?-- continue nebs and PRN inhalers ?  ?Hypertension ?-- continue lisinopril and beta-blockers ?  ?chronic essential tremors ?chronic neuropathy ?-- continue gabapentin 100 mg TID ?  ?history of lung and bladder cancer ?-- not on any treatment ? ? ?  ? ?Consultants: Orthopedic surgery ?Procedures performed: Left hip hemiarthroplasty, 08/19/21 ?Disposition: Skilled nursing  facility ?Diet recommendation:  ?Cardiac diet ?DISCHARGE MEDICATION: ?Allergies as of 08/22/2021   ?No Active Allergies ?  ? ?  ?Medication List  ?  ? ?STOP taking these medications   ? ?azelastine 0.1 % nasal spray ?Commonly known as: ASTELIN ?  ? ?  ? ?TAKE these medications   ? ?albuterol 108 (90 Base) MCG/ACT inhaler ?Commonly known as: VENTOLIN HFA ?Inhale 2 puffs into the lungs every 6 (six) hours as needed for wheezing or shortness of breath. ?  ?ASPIRIN 81 PO ?Take 1 tablet by mouth daily. ?  ?furosemide 40 MG tablet ?Commonly known as: LASIX ?Take 1 tablet (40 mg total) by mouth every other day. ?  ?gabapentin 100 MG capsule ?Commonly known as: NEURONTIN ?Take 1 capsule (100 mg total) by mouth 3 (three) times daily. ?What changed:  ?when to take this ?reasons to take this ?  ?HYDROcodone-acetaminophen 7.5-325 MG tablet ?Commonly known as: Norco ?Take 1 tablet by mouth every 4 (four) hours as needed for moderate pain. ?  ?lisinopril 10 MG tablet ?Commonly known as: ZESTRIL ?Take 1 tablet (10 mg total) by mouth every evening. ?  ?metoprolol tartrate 25 MG tablet ?Commonly known as: LOPRESSOR ?Take 1 tablet (25 mg total) by mouth daily. ?  ?multivitamin with minerals Tabs tablet ?Take 1 tablet by mouth every evening. ?  ?naloxone 4 MG/0.1ML Liqd nasal spray kit ?Commonly known as: NARCAN ?SPRAY 1 SPRAY INTO ONE NOSTRIL AS DIRECTED FOR OPIOID OVERDOSE (TURN PERSON ON SIDE AFTER DOSE. IF NO RESPONSE IN 2-3 MINUTES OR PERSON RESPONDS BUT RELAPSES, REPEAT USING A NEW SPRAY DEVICE AND  SPRAY INTO THE OTHER NOSTRIL. CALL 911 AFTER USE.) * EMERGENCY USE ONLY * ?  ?omeprazole 20 MG capsule ?Commonly known as: PRILOSEC ?Take 20 mg by mouth every morning. ?  ?OXYGEN ?Inhale 2 L into the lungs daily. Using 4 L ?  ?oxymetazoline 0.05 % nasal spray ?Commonly known as: AFRIN ?Place 1 spray into both nostrils 2 (two) times daily. As needed ?  ?polyethylene glycol 17 g packet ?Commonly known as: MIRALAX / GLYCOLAX ?Take 17 g by  mouth daily. ?  ?potassium chloride 10 MEQ tablet ?Commonly known as: KLOR-CON ?Take 1 tablet (10 mEq total) by mouth every other day. ?  ?Trelegy Ellipta 100-62.5-25 MCG/ACT Aepb ?Generic drug: Fluticasone-Umeclidin-Vilant ?Inhale 1 puff into the lungs daily. ?  ?vitamin B-12 100 MCG tablet ?Commonly known as: CYANOCOBALAMIN ?Take 100 mcg by mouth every evening. ?  ?vitamin E 180 MG (400 UNITS) capsule ?Take 400 Units by mouth every evening. ?  ? ?  ? ?  ?  ? ? ?  ?Discharge Care Instructions  ?(From admission, onward)  ?  ? ? ?  ? ?  Start     Ordered  ? 08/22/21 0000  Discharge wound care:       ?Comments: Continue surgical dressing until outpatient follow up  ? 08/22/21 1046  ? ?  ?  ? ?  ? ? Follow-up Information   ? ? Graham, Maurice, PA-C. Schedule an appointment as soon as possible for a visit in 2 week(s).   ?Specialty: Orthopedic Surgery ?Contact information: ?[address] ?[place] [address] ?[phone] ? ? ?  ?  ? ?  ?  ? ?  ? ?Discharge Exam: ?Filed Weights  ? 08/18/21 1314  ?Weight: 72.6 kg  ?Physical Exam ?Constitutional:   ?   General: He is not in acute distress. ?   Appearance: Normal appearance.  ?HENT:  ?   Head: Normocephalic and atraumatic.  ?   Mouth/Throat:  ?   Mouth: Mucous membranes are moist.  ?Eyes:  ?   Extraocular Movements: Extraocular movements intact.  ?Cardiovascular:  ?   Rate and Rhythm: Normal rate and regular rhythm.  ?   Heart sounds: Normal heart sounds.  ?Pulmonary:  ?   Effort: Pulmonary effort is normal. No respiratory distress.  ?   Breath sounds: Normal breath sounds. No wheezing.  ?Abdominal:  ?   General: Bowel sounds are normal. There is no distension.  ?   Palpations: Abdomen is soft.  ?   Tenderness: There is no abdominal tenderness.  ?Musculoskeletal:     ?   General: Normal range of motion.  ?   Cervical back: Normal range of motion and neck supple.  ?   Comments: Left thigh compartments soft, mild swelling; surgical dressing in place  ?Skin: ?    General: Skin is warm and dry.  ?Neurological:  ?   General: No focal deficit present.  ?   Mental Status: He is alert.  ?Psychiatric:     ?   Mood and Affect: Mood normal.     ?   Behavior: Behavior normal.  ? ? ? ?Condition at discharge: stable ? ?The results of significant diagnostics from this hospitalization (including imaging, microbiology, ancillary and laboratory) are listed below for reference.  ? ?Imaging Studies: ?DG Chest Port 1 View ? ?Result Date: 08/18/2021 ?CLINICAL DATA:  Mechanical fall, weakness EXAM: PORTABLE CHEST 1 VIEW COMPARISON:  Radiograph 05/02/2020, chest CT 01/05/2021 FINDINGS: Right medial mid lung consolidation similar   to prior chest CT. Stable right apical opacity with adjacent clips. No new airspace disease. No pleural effusion. No pneumothorax. No acute osseous abnormality. No acute osseous abnormality. IMPRESSION: Stable exam with similar right medial mid lung consolidation in comparison to prior chest CT in July 2022. Unchanged right apical opacity with adjacent clips. No new airspace disease. Electronically Signed   By: Jacob  Kahn M.D.   On: 08/18/2021 13:50  ? ?DG HIP UNILAT WITH PELVIS 2-3 VIEWS LEFT ? ?Result Date: 08/19/2021 ?CLINICAL DATA:  Status post left hip hemiarthroplasty EXAM: DG HIP (WITH OR WITHOUT PELVIS) 2-3V LEFT COMPARISON:  Yesterday FINDINGS: Interval left hip arthroplasty. Mild right hip osteoarthritis. No periprosthetic fracture or acute hardware complication. Vascular calcifications. IMPRESSION: Expected appearance after left hip arthroplasty. Electronically Signed   By: Kyle  Talbot M.D.   On: 08/19/2021 12:25  ? ?DG Hip Unilat With Pelvis 2-3 Views Left ? ?Result Date: 08/18/2021 ?CLINICAL DATA:  Fall last night EXAM: DG HIP (WITH OR WITHOUT PELVIS) 2-3V LEFT COMPARISON:  None. FINDINGS: There is a mildly displaced fracture of the left femoral neck with proximal displacement of the distal fragment. Femoroacetabular alignment is maintained. There is no other  fracture. Alignment is maintained on the right. The SI joints and symphysis pubis are intact. IMPRESSION: Mildly displaced left femoral neck fracture. Electronically Signed   By: Peter  Noone M.D.   On: 08/18/2021 13

## 2021-08-22 NOTE — Plan of Care (Signed)

## 2021-08-22 NOTE — Progress Notes (Signed)
Physical Therapy Treatment ?Patient Details ?Name: Joseph Graham ?MRN: [id number] ?DOB: 10-Oct-1940 ?Today's Date: 08/22/2021 ? ? ?History of Present Illness Patient is an 81 year old male s/p left hip hemi arthroplasty. PMH (+) for chronic hypoxic respiratory failure on 5 L oxygen, hypertension, B12 deficiency anemia, lung cancer and bladder cancer in remission. ? ?  ?PT Comments  ? ? Pt was pleasant and motivated to participate during the session and put forth good effort throughout. Pt made good progress towards goals this session including grossly decreased assistance needed with transfers and a significant increase in max amb distance and quality of gait.  Pt was steady with walking with no LOB and with grossly improved cadence.  Pt's SpO2 remained in the 90s throughout the session with HR WNL on 3-4LO2/min.  Pt will benefit from PT services in a SNF setting upon discharge to safely address deficits listed in patient problem list for decreased caregiver assistance and eventual return to PLOF. ? ?   ?Recommendations for follow up therapy are one component of a multi-disciplinary discharge planning process, led by the attending physician.  Recommendations may be updated based on patient status, additional functional criteria and insurance authorization. ? ?Follow Up Recommendations ? Skilled nursing-short term rehab (<3 hours/day) ?  ?  ?Assistance Recommended at Discharge Frequent or constant Supervision/Assistance  ?Patient can return home with the following Assistance with feeding;Assist for transportation;Help with stairs or ramp for entrance;A lot of help with bathing/dressing/bathroom;Assistance with cooking/housework;A lot of help with walking and/or transfers ?  ?Equipment Recommendations ?    ?  ?Recommendations for Other Services   ? ? ?  ?Precautions / Restrictions Precautions ?Precautions: Posterior Hip;Fall ?Restrictions ?Weight Bearing Restrictions: Yes ?LLE Weight Bearing: Weight bearing as tolerated   ?  ? ?Mobility ? Bed Mobility ?  ?  ?  ?  ?  ?  ?  ?General bed mobility comments: NT, pt in recliner ?  ? ?Transfers ?Overall transfer level: Needs assistance ?Equipment used: Rolling walker (2 wheels) ?Transfers: Sit to/from Stand ?Sit to Stand: Min assist ?  ?  ?  ?  ?  ?General transfer comment: Mod verbal cues for sequencing for hip precaution compliance ?  ? ?Ambulation/Gait ?Ambulation/Gait assistance: Min guard ?Gait Distance (Feet): 20 Feet ?Assistive device: Rolling walker (2 wheels) ?Gait Pattern/deviations: Step-to pattern, Decreased stance time - left, Antalgic ?Gait velocity: decreased ?  ?  ?General Gait Details: Min verbal and visual cues for sequencing, no LOB with grossly improved cadence and activity tolerance this session ? ? ?Stairs ?  ?  ?  ?  ?  ? ? ?Wheelchair Mobility ?  ? ?Modified Rankin (Stroke Patients Only) ?  ? ? ?  ?Balance Overall balance assessment: Needs assistance ?Sitting-balance support: No upper extremity supported, Feet supported ?Sitting balance-Leahy Scale: Good ?  ?  ?Standing balance support: Bilateral upper extremity supported, Reliant on assistive device for balance, During functional activity ?Standing balance-Leahy Scale: Fair ?  ?  ?  ?  ?  ?  ?  ?  ?  ?  ?  ?  ?  ? ?  ?Cognition Arousal/Alertness: Awake/alert ?Behavior During Therapy: [facility] for tasks assessed/performed ?Overall Cognitive Status: Within Functional Limits for tasks assessed ?  ?  ?  ?  ?  ?  ?  ?  ?  ?  ?  ?  ?  ?  ?  ?  ?  ?  ?  ? ?  ?Exercises Total Joint Exercises ?  Long Arc Quad: AROM, Strengthening, Both, 10 reps ?Knee Flexion: AROM, Strengthening, Both, 10 reps ?Other Exercises: 90 deg L turn training to prevent CKC L hip IR ?Other Exercises: Posterior hip precaution education with pt and spouse during functional tasks ? ?  ?General Comments   ?  ?  ? ?Pertinent Vitals/Pain Pain Assessment ?Pain Assessment: 0-10 ?Pain Score: 2  ?Faces Pain Scale: Hurts a little bit ?Pain Location: L hip ?Pain  Descriptors / Indicators: Sore ?Pain Intervention(s): Premedicated before session, Monitored during session  ? ? ?Home Living   ?  ?  ?  ?  ?  ?  ?  ?  ?  ?   ?  ?Prior Function    ?  ?  ?   ? ?PT Goals (current goals can now be found in the care plan section) Progress towards PT goals: Progressing toward goals ? ?  ?Frequency ? ? ? BID ? ? ? ?  ?PT Plan    ? ? ?Co-evaluation   ?  ?  ?  ?  ? ?  ?AM-PAC PT "6 Clicks" Mobility   ?Outcome Measure ? Help needed turning from your back to your side while in a flat bed without using bedrails?: A Lot ?Help needed moving from lying on your back to sitting on the side of a flat bed without using bedrails?: A Lot ?Help needed moving to and from a bed to a chair (including a wheelchair)?: A Lot ?Help needed standing up from a chair using your arms (e.g., wheelchair or bedside chair)?: A Little ?Help needed to walk in hospital room?: A Little ?Help needed climbing 3-5 steps with a railing? : A Lot ?6 Click Score: 14 ? ?  ?End of Session Equipment Utilized During Treatment: Gait belt;Oxygen ?Activity Tolerance: Patient tolerated treatment well ?Patient left: in chair;with call bell/phone within reach;with chair alarm set;with SCD's reapplied;with family/visitor present ?Nurse Communication: Mobility status;Weight bearing status;Precautions ?PT Visit Diagnosis: Unsteadiness on feet (R26.81);Muscle weakness (generalized) (M62.81);Difficulty in walking, not elsewhere classified (R26.2);Pain ?Pain - Right/Left: Left ?Pain - part of body: Hip ?  ? ? ?Time: 5093-2671 ?PT Time Calculation (min) (ACUTE ONLY): 29 min ? ?Charges:  $Gait Training: 8-22 mins ?$Therapeutic Activity: 8-22 mins          ?          ? ?D. Royetta Asal PT, DPT ?08/22/21, 11:28 AM ? ? ?

## 2021-08-22 NOTE — Care Management Important Message (Signed)
Important Message ? ?Patient Details  ?Name: Joseph Graham ?MRN: [id number] ?Date of Birth: 1940-10-19 ? ? ?Medicare Important Message Given:  Yes ? ? ? ? ?[name] ?08/22/2021, 11:10 AM ?

## 2021-08-23 ENCOUNTER — Encounter: Payer: Medicare Other | Admitting: Family Medicine

## 2021-08-23 DIAGNOSIS — J441 Chronic obstructive pulmonary disease with (acute) exacerbation: Secondary | ICD-10-CM | POA: Diagnosis not present

## 2021-08-23 DIAGNOSIS — I10 Essential (primary) hypertension: Secondary | ICD-10-CM | POA: Diagnosis not present

## 2021-08-23 DIAGNOSIS — M545 Low back pain, unspecified: Secondary | ICD-10-CM | POA: Diagnosis not present

## 2021-08-23 DIAGNOSIS — K219 Gastro-esophageal reflux disease without esophagitis: Secondary | ICD-10-CM | POA: Diagnosis not present

## 2021-08-23 DIAGNOSIS — S7292XA Unspecified fracture of left femur, initial encounter for closed fracture: Secondary | ICD-10-CM | POA: Diagnosis not present

## 2021-08-23 DIAGNOSIS — E119 Type 2 diabetes mellitus without complications: Secondary | ICD-10-CM | POA: Diagnosis not present

## 2021-08-23 DIAGNOSIS — G9009 Other idiopathic peripheral autonomic neuropathy: Secondary | ICD-10-CM | POA: Diagnosis not present

## 2021-08-24 ENCOUNTER — Other Ambulatory Visit: Payer: Self-pay | Admitting: *Deleted

## 2021-08-24 DIAGNOSIS — G893 Neoplasm related pain (acute) (chronic): Secondary | ICD-10-CM | POA: Diagnosis not present

## 2021-08-24 DIAGNOSIS — S7292XA Unspecified fracture of left femur, initial encounter for closed fracture: Secondary | ICD-10-CM | POA: Diagnosis not present

## 2021-08-24 DIAGNOSIS — J449 Chronic obstructive pulmonary disease, unspecified: Secondary | ICD-10-CM | POA: Diagnosis not present

## 2021-08-24 DIAGNOSIS — E782 Mixed hyperlipidemia: Secondary | ICD-10-CM | POA: Diagnosis not present

## 2021-08-24 DIAGNOSIS — I10 Essential (primary) hypertension: Secondary | ICD-10-CM | POA: Diagnosis not present

## 2021-08-24 DIAGNOSIS — G9009 Other idiopathic peripheral autonomic neuropathy: Secondary | ICD-10-CM | POA: Diagnosis not present

## 2021-08-24 DIAGNOSIS — G894 Chronic pain syndrome: Secondary | ICD-10-CM | POA: Diagnosis not present

## 2021-08-24 DIAGNOSIS — I2510 Atherosclerotic heart disease of native coronary artery without angina pectoris: Secondary | ICD-10-CM | POA: Diagnosis not present

## 2021-08-24 DIAGNOSIS — E119 Type 2 diabetes mellitus without complications: Secondary | ICD-10-CM | POA: Diagnosis not present

## 2021-08-24 DIAGNOSIS — K219 Gastro-esophageal reflux disease without esophagitis: Secondary | ICD-10-CM | POA: Diagnosis not present

## 2021-08-24 NOTE — Patient Outreach (Signed)
Per [place] eligible member currently resides in [facility].  Screened for potential [facility] care coordination services as a benefit of [employer] plan. ? ?Member's PCP at [facility] has [place] care coordination/care management services available if needed post SNF. Appears [name] is active with Pharmacy CCM services with PCP office.  ? ?[name] admitted to SNF on 08/22/21 after hospitalization. ? ?Communication sent to facility SW to make aware writer is following for transition plans and post SNF care coordination/care management needs. ? ? ? ? ? ?Marthenia Rolling, MSN, RN,BSN ?[place] Coordinator ?[phone] [facility]) ?[phone]  (Toll free office)   ?

## 2021-08-25 ENCOUNTER — Telehealth: Payer: Self-pay

## 2021-08-25 DIAGNOSIS — M545 Low back pain, unspecified: Secondary | ICD-10-CM | POA: Diagnosis not present

## 2021-08-25 DIAGNOSIS — G9009 Other idiopathic peripheral autonomic neuropathy: Secondary | ICD-10-CM | POA: Diagnosis not present

## 2021-08-25 DIAGNOSIS — J441 Chronic obstructive pulmonary disease with (acute) exacerbation: Secondary | ICD-10-CM | POA: Diagnosis not present

## 2021-08-25 DIAGNOSIS — S7292XA Unspecified fracture of left femur, initial encounter for closed fracture: Secondary | ICD-10-CM | POA: Diagnosis not present

## 2021-08-25 DIAGNOSIS — E119 Type 2 diabetes mellitus without complications: Secondary | ICD-10-CM | POA: Diagnosis not present

## 2021-08-25 DIAGNOSIS — I10 Essential (primary) hypertension: Secondary | ICD-10-CM | POA: Diagnosis not present

## 2021-08-25 DIAGNOSIS — K219 Gastro-esophageal reflux disease without esophagitis: Secondary | ICD-10-CM | POA: Diagnosis not present

## 2021-08-25 MED ORDER — TRELEGY ELLIPTA 100-62.5-25 MCG/ACT IN AEPB: 1.0000 | INHALATION_SPRAY | Freq: Every day | RESPIRATORY_TRACT | 3 refills | Status: DC

## 2021-08-25 NOTE — Telephone Encounter (Signed)
E-scribed refill 

## 2021-08-28 ENCOUNTER — Other Ambulatory Visit: Payer: Self-pay | Admitting: *Deleted

## 2021-08-28 DIAGNOSIS — S7292XA Unspecified fracture of left femur, initial encounter for closed fracture: Secondary | ICD-10-CM | POA: Diagnosis not present

## 2021-08-28 DIAGNOSIS — J441 Chronic obstructive pulmonary disease with (acute) exacerbation: Secondary | ICD-10-CM | POA: Diagnosis not present

## 2021-08-28 DIAGNOSIS — I10 Essential (primary) hypertension: Secondary | ICD-10-CM | POA: Diagnosis not present

## 2021-08-28 DIAGNOSIS — M545 Low back pain, unspecified: Secondary | ICD-10-CM | POA: Diagnosis not present

## 2021-08-28 DIAGNOSIS — K219 Gastro-esophageal reflux disease without esophagitis: Secondary | ICD-10-CM | POA: Diagnosis not present

## 2021-08-28 DIAGNOSIS — E119 Type 2 diabetes mellitus without complications: Secondary | ICD-10-CM | POA: Diagnosis not present

## 2021-08-28 DIAGNOSIS — G9009 Other idiopathic peripheral autonomic neuropathy: Secondary | ICD-10-CM | POA: Diagnosis not present

## 2021-08-28 NOTE — Patient Outreach (Signed)
THN Post- Acute Care Coordinator follow up. Per [place] eligible member currently resides in [facility].  Screened for potential [facility] care coordination services as a benefit of [employer] plan. ? ?Member's PCP at [facility] has [place] care coordination team available if needed. Joseph Graham is active with Pharmacy CCM services at PCP office.   ? ?Facility site visit to [facility] skilled nursing facility. Met with [name], SNF SW concerning transition plan and potential THN needs. Anticipated transition plan is to return home with spouse.  ? ?Went to visit member in room at [facility]. However, therapy was in the room with [name]. Will attempt visit at another time.  ? ?Will continue to follow for transition plans/needs while member resides in SNF.  ? ? ?Marthenia Rolling, MSN, RN,BSN ?[place] Coordinator ?[phone] [facility]) ?[phone]  (Toll free office)  ? ? ? ? ?  ?

## 2021-08-30 DIAGNOSIS — K219 Gastro-esophageal reflux disease without esophagitis: Secondary | ICD-10-CM | POA: Diagnosis not present

## 2021-08-30 DIAGNOSIS — S7292XA Unspecified fracture of left femur, initial encounter for closed fracture: Secondary | ICD-10-CM | POA: Diagnosis not present

## 2021-08-30 DIAGNOSIS — J441 Chronic obstructive pulmonary disease with (acute) exacerbation: Secondary | ICD-10-CM | POA: Diagnosis not present

## 2021-08-30 DIAGNOSIS — M545 Low back pain, unspecified: Secondary | ICD-10-CM | POA: Diagnosis not present

## 2021-08-30 DIAGNOSIS — E119 Type 2 diabetes mellitus without complications: Secondary | ICD-10-CM | POA: Diagnosis not present

## 2021-08-30 DIAGNOSIS — I10 Essential (primary) hypertension: Secondary | ICD-10-CM | POA: Diagnosis not present

## 2021-08-30 DIAGNOSIS — G9009 Other idiopathic peripheral autonomic neuropathy: Secondary | ICD-10-CM | POA: Diagnosis not present

## 2021-09-01 ENCOUNTER — Encounter: Payer: Self-pay | Admitting: Family Medicine

## 2021-09-01 DIAGNOSIS — D649 Anemia, unspecified: Secondary | ICD-10-CM | POA: Diagnosis not present

## 2021-09-01 DIAGNOSIS — M545 Low back pain, unspecified: Secondary | ICD-10-CM | POA: Diagnosis not present

## 2021-09-01 DIAGNOSIS — E119 Type 2 diabetes mellitus without complications: Secondary | ICD-10-CM | POA: Diagnosis not present

## 2021-09-01 DIAGNOSIS — S7292XA Unspecified fracture of left femur, initial encounter for closed fracture: Secondary | ICD-10-CM | POA: Diagnosis not present

## 2021-09-01 DIAGNOSIS — K219 Gastro-esophageal reflux disease without esophagitis: Secondary | ICD-10-CM | POA: Diagnosis not present

## 2021-09-01 DIAGNOSIS — I10 Essential (primary) hypertension: Secondary | ICD-10-CM | POA: Diagnosis not present

## 2021-09-01 DIAGNOSIS — G9009 Other idiopathic peripheral autonomic neuropathy: Secondary | ICD-10-CM | POA: Diagnosis not present

## 2021-09-01 DIAGNOSIS — J441 Chronic obstructive pulmonary disease with (acute) exacerbation: Secondary | ICD-10-CM | POA: Diagnosis not present

## 2021-09-01 NOTE — Telephone Encounter (Signed)
Dr Darnell Level said OK to schedule phone visit or VV on 09/04/21 at 10 AM to talk with Dr Darnell Level. I spoke with pt and pt agreed to that time and will talk with Dr Darnell Level at that time. Sending note to Dr Darnell Level and Lattie Haw CMA. ?

## 2021-09-01 NOTE — Telephone Encounter (Signed)
I spoke with pt; I offered to schedule pt a phone appt and pt said he does not want an appt he wants a phone visit; I asked pt if I could ask what was bothering him or reason for phone visit and he said I could ask but he just wants to talk with Dr Darnell Level about several things. Pt said he wants to talk about oxygen concentrator that has been going on for months and wants to make sure Dr Darnell Level knows about his hip break and some other things. See pt message for 07/24/21 about oxygen concentrator. Sending note to Dr Darnell Level and Lattie Haw CMA. ?

## 2021-09-04 ENCOUNTER — Other Ambulatory Visit: Payer: Self-pay

## 2021-09-04 ENCOUNTER — Other Ambulatory Visit: Payer: Self-pay | Admitting: *Deleted

## 2021-09-04 ENCOUNTER — Telehealth (INDEPENDENT_AMBULATORY_CARE_PROVIDER_SITE_OTHER): Payer: Medicare Other | Admitting: Family Medicine

## 2021-09-04 ENCOUNTER — Encounter: Payer: Self-pay | Admitting: Family Medicine

## 2021-09-04 VITALS — BP 139/80 | HR 64 | Temp 97.7°F | Ht 68.0 in | Wt 160.0 lb

## 2021-09-04 DIAGNOSIS — G250 Essential tremor: Secondary | ICD-10-CM | POA: Diagnosis not present

## 2021-09-04 DIAGNOSIS — H3530 Unspecified macular degeneration: Secondary | ICD-10-CM | POA: Diagnosis not present

## 2021-09-04 DIAGNOSIS — S72002A Fracture of unspecified part of neck of left femur, initial encounter for closed fracture: Secondary | ICD-10-CM

## 2021-09-04 DIAGNOSIS — J439 Emphysema, unspecified: Secondary | ICD-10-CM

## 2021-09-04 DIAGNOSIS — Z7951 Long term (current) use of inhaled steroids: Secondary | ICD-10-CM | POA: Diagnosis not present

## 2021-09-04 DIAGNOSIS — M47812 Spondylosis without myelopathy or radiculopathy, cervical region: Secondary | ICD-10-CM | POA: Diagnosis not present

## 2021-09-04 DIAGNOSIS — G709 Myoneural disorder, unspecified: Secondary | ICD-10-CM | POA: Diagnosis not present

## 2021-09-04 DIAGNOSIS — I10 Essential (primary) hypertension: Secondary | ICD-10-CM | POA: Diagnosis not present

## 2021-09-04 DIAGNOSIS — Z96642 Presence of left artificial hip joint: Secondary | ICD-10-CM | POA: Diagnosis not present

## 2021-09-04 DIAGNOSIS — J449 Chronic obstructive pulmonary disease, unspecified: Secondary | ICD-10-CM | POA: Diagnosis not present

## 2021-09-04 DIAGNOSIS — Z7982 Long term (current) use of aspirin: Secondary | ICD-10-CM | POA: Diagnosis not present

## 2021-09-04 DIAGNOSIS — I499 Cardiac arrhythmia, unspecified: Secondary | ICD-10-CM | POA: Diagnosis not present

## 2021-09-04 DIAGNOSIS — J9611 Chronic respiratory failure with hypoxia: Secondary | ICD-10-CM | POA: Diagnosis not present

## 2021-09-04 DIAGNOSIS — M545 Low back pain, unspecified: Secondary | ICD-10-CM | POA: Diagnosis not present

## 2021-09-04 DIAGNOSIS — J441 Chronic obstructive pulmonary disease with (acute) exacerbation: Secondary | ICD-10-CM | POA: Diagnosis not present

## 2021-09-04 DIAGNOSIS — S72002D Fracture of unspecified part of neck of left femur, subsequent encounter for closed fracture with routine healing: Secondary | ICD-10-CM | POA: Diagnosis not present

## 2021-09-04 DIAGNOSIS — E1143 Type 2 diabetes mellitus with diabetic autonomic (poly)neuropathy: Secondary | ICD-10-CM | POA: Diagnosis not present

## 2021-09-04 DIAGNOSIS — G8929 Other chronic pain: Secondary | ICD-10-CM | POA: Diagnosis not present

## 2021-09-04 DIAGNOSIS — E11319 Type 2 diabetes mellitus with unspecified diabetic retinopathy without macular edema: Secondary | ICD-10-CM | POA: Diagnosis not present

## 2021-09-04 DIAGNOSIS — K219 Gastro-esophageal reflux disease without esophagitis: Secondary | ICD-10-CM | POA: Diagnosis not present

## 2021-09-04 DIAGNOSIS — E785 Hyperlipidemia, unspecified: Secondary | ICD-10-CM | POA: Diagnosis not present

## 2021-09-04 DIAGNOSIS — I2510 Atherosclerotic heart disease of native coronary artery without angina pectoris: Secondary | ICD-10-CM | POA: Diagnosis not present

## 2021-09-04 DIAGNOSIS — D519 Vitamin B12 deficiency anemia, unspecified: Secondary | ICD-10-CM | POA: Diagnosis not present

## 2021-09-04 DIAGNOSIS — Z9981 Dependence on supplemental oxygen: Secondary | ICD-10-CM | POA: Diagnosis not present

## 2021-09-04 DIAGNOSIS — K5900 Constipation, unspecified: Secondary | ICD-10-CM | POA: Diagnosis not present

## 2021-09-04 NOTE — Progress Notes (Signed)
? ? Patient ID: Joseph Graham, male    DOB: 07/07/1940, 81 y.o.   MRN: [id number] ? ?Virtual visit completed through MyChart, a video enabled telemedicine application. Due to national recommendations of social distancing due to COVID-19, a virtual visit is felt to be most appropriate for this patient at this time. Reviewed limitations, risks, security and privacy concerns of performing a virtual visit and the availability of in person appointments. I also reviewed that there may be a patient responsible charge related to this service. The patient agreed to proceed.  ? ?Patient location: home ?Provider location: Financial controller at [facility], office ?Persons participating in this virtual visit: patient, provider  ? ?If any vitals were documented, they were collected by patient at home unless specified below.   ? ?BP 139/80   Pulse 64   Temp 97.7 ?F (36.5 ?C)   Ht 5\' 8"  (1.727 m)   Wt 160 lb (72.6 kg)   SpO2 97%   BMI 24.33 kg/m?   ? ?CC: hosp f/u visit, discuss meds  ?Subjective:  ? ?HPI: ?Joseph Graham is a 81 y.o. male presenting on 09/04/2021 for Discuss Medication (Wants to discuss O2 rx.  Also, wants to discuss meds that were adjusted during rehab after hip fx. ) ? ? ?Known severe COPD with chronic hypoxic respiratory failure on 5L O2, lung and bladder cancer in remission.  ?Recent hospitalization for L hip fracture after fall at home, s/p L hip hemiarthroplasty on 08/19/2021 (Dr Gaspar Bidding).  ?Hospital records reviewed. Med rec performed.  ?Placed on aspirin for DVT ppx.  ? ?Discharged to [facility] for rehab and came home on Friday 09/01/2021.  ?Home health has been set up. First appt pending today. Does not know name of the home health agency. ?Other follow up appointments scheduled: Dr Harlow Mares ortho upcoming appt ? ?Med changes while in SNF - gabapentin increased to 200mg  TID for tremor.  ? ?Requests assistance to change DME for stationary oxygen generator which sounded an alarm several weeks ago - has been  also trying to get his oxygen tanks refilled for 4 months without success - doesn't really use these. Currently using [place] ([place]). Generator is downstairs, he uses 16ft tubing to go up his stairs - predominant portion of his day is spent upstairs.  He states he needs to increase his oxygen flow to 6 L due to resistance of tubing. ?Home oxygen started ~04/2020 after PTX.  ?He owns portable [employer] oxygen concentrator.  ?_____________________________________________________________________ ?Hospital admission: 08/18/2021 ?Hospital discharge: 08/22/2021 ?TCM f/u phone call: not performed  ? ?Recommendations at discharge:  ? Follow up with orthopedic surgery for staple removal within 2 weeks ?  ?Discharge Diagnoses: ?Principal Problem: ?  Hip fracture ([place]) ?Active Problems: ?  HTN (hypertension) ?  Benign essential tremor ?  COPD, severe ([place]) ?  Chronic respiratory failure with hypoxia (HCC) ?   ? ?Relevant past medical, surgical, family and social history reviewed and updated as indicated. Interim medical history since our last visit reviewed. ?Allergies and medications reviewed and updated. ?Outpatient Medications Prior to Visit  ?Medication Sig Dispense Refill  ? albuterol (VENTOLIN HFA) 108 (90 Base) MCG/ACT inhaler Inhale 2 puffs into the lungs every 6 (six) hours as needed for wheezing or shortness of breath. 18 g 3  ? ASPIRIN 81 PO Take 1 tablet by mouth daily.    ? Fluticasone-Umeclidin-Vilant (TRELEGY ELLIPTA) 100-62.5-25 MCG/ACT AEPB Inhale 1 puff into the lungs daily. 1 each 3  ?  furosemide (LASIX) 40 MG tablet Take 1 tablet (40 mg total) by mouth every other day. 45 tablet 0  ? HYDROcodone-acetaminophen (NORCO) 7.5-325 MG tablet Take 1 tablet by mouth every 4 (four) hours as needed for moderate pain. 30 tablet 0  ? lisinopril (ZESTRIL) 10 MG tablet Take 1 tablet (10 mg total) by mouth every evening. 90 tablet 1  ? metoprolol tartrate (LOPRESSOR) 25 MG tablet Take 1 tablet (25  mg total) by mouth daily. 90 tablet 1  ? Multiple Vitamin (MULTIVITAMIN WITH MINERALS) TABS tablet Take 1 tablet by mouth every evening.    ? naloxone (NARCAN) nasal spray 4 mg/0.1 mL SPRAY 1 SPRAY INTO ONE NOSTRIL AS DIRECTED FOR OPIOID OVERDOSE (TURN PERSON ON SIDE AFTER DOSE. IF NO RESPONSE IN 2-3 MINUTES OR PERSON RESPONDS BUT RELAPSES, REPEAT USING A NEW SPRAY DEVICE AND SPRAY INTO THE OTHER NOSTRIL. CALL 911 AFTER USE.) * EMERGENCY USE ONLY * 1 each 0  ? omeprazole (PRILOSEC) 20 MG capsule Take 20 mg by mouth every morning.     ? OXYGEN Inhale 2 L into the lungs daily. Using 4 L    ? oxymetazoline (AFRIN) 0.05 % nasal spray Place 1 spray into both nostrils 2 (two) times daily. As needed    ? polyethylene glycol (MIRALAX / GLYCOLAX) packet Take 17 g by mouth daily.    ? potassium chloride (KLOR-CON) 10 MEQ tablet Take 1 tablet (10 mEq total) by mouth every other day. 45 tablet 0  ? vitamin B-12 (CYANOCOBALAMIN) 100 MCG tablet Take 100 mcg by mouth every evening.    ? vitamin E 400 UNIT capsule Take 400 Units by mouth every evening.     ? gabapentin (NEURONTIN) 100 MG capsule Take 1 capsule (100 mg total) by mouth 3 (three) times daily.    ? gabapentin (NEURONTIN) 100 MG capsule Take 2 capsules (200 mg total) by mouth 3 (three) times daily.    ? ?No facility-administered medications prior to visit.  ?  ? ?Per HPI unless specifically indicated in ROS section below ?Review of Systems ?Objective:  ?BP 139/80   Pulse 64   Temp 97.7 ?F (36.5 ?C)   Ht 5\' 8"  (2.671 m)   Wt 160 lb (72.6 kg)   SpO2 97%   BMI 24.33 kg/m?   ?Wt Readings from Last 3 Encounters:  ?09/04/21 160 lb (72.6 kg)  ?08/18/21 160 lb (72.6 kg)  ?06/14/21 156 lb (70.8 kg)  ?  ?  ? ?Physical exam: ?Gen: alert, NAD, not ill appearing, using supplemental oxygen  ?Pulm: speaks in complete sentences without increased work of breathing ?Psych: normal mood, normal thought content  ? ?   ?Assessment & Plan:  ? ?Problem List Items Addressed This Visit    ? ? Benign essential tremor  ?  Gabapentin dose increased to 200 mg 3 times daily.  Continue. ?  ?  ? COPD, severe ([place])  ?  Severe, oxygen dependent.  ?See below.  ?Continue Trelegy Ellipta. ?  ?  ? Relevant Orders  ? Ambulatory referral to Home Health  ? Chronic respiratory failure with hypoxia (HCC)  ?  Oxygen dependent.  Again reviewed oxygen saturation goal of 90 to 95% for severe COPD. ?He will let me know name of home health agency and I will request respiratory therapist to come out to the house to evaluate oxygen needs. ?Addendum: Home health agency is [facility] ?  ?  ? Relevant Orders  ? Ambulatory referral to Home Health  ?  Bullous emphysema ([place])  ?  See below.  ?  ?  ? Relevant Orders  ? Ambulatory referral to Home Health  ? Hip fracture ([place]) - Primary  ?  Recovering remarkably well from left hip fracture status post left hip hemiarthroplasty. ?Appreciate orthopedic care. ?Continue aspirin for DVT ppx for now ?  ?  ? Relevant Orders  ? Ambulatory referral to Home Health  ?  ? ?No orders of the defined types were placed in this encounter. ? ?Orders Placed This Encounter  ?Procedures  ? Ambulatory referral to Home Health  ?  Referral Priority:   Routine  ?  Referral Type:   Home Health Care  ?  Referral Reason:   Specialty Services Required  ?  Requested Specialty:   [place]  ?  Number of Visits Requested:   1  ? ? ?I discussed the assessment and treatment plan with the patient. The patient was provided an opportunity to ask questions and all were answered. The patient agreed with the plan and demonstrated an understanding of the instructions. The patient was advised to call back or seek an in-person evaluation if the symptoms worsen or if the condition fails to improve as anticipated. ? ?Follow up plan: ?No follow-ups on file. ? ?[name], MD   ?

## 2021-09-04 NOTE — Patient Outreach (Signed)
THN Post- Acute Care Coordinator follow up. Member screened for potential [facility] services as a a benefit of Mr. [employer]  insurance plan.  ? ?Verified in [place] that Mr. Vanzanten transitioned from [facility] to home on 09/01/21. ? ?Member's PCP at [employer] has [place] care coordination services. Member is active with pharmacy CCM services at [facility]. Notification sent to Pharmacist to make aware Mr. Legault discharged from SNF.  ? ? ? ? ?Marthenia Rolling, MSN, RN,BSN ?[place] Coordinator ?[phone] [facility]) ?[phone]  (Toll free office)   ?

## 2021-09-05 NOTE — Assessment & Plan Note (Addendum)
Recovering remarkably well from left hip fracture status post left hip hemiarthroplasty. ?Appreciate orthopedic care. ?Continue aspirin for DVT ppx for now ?

## 2021-09-05 NOTE — Assessment & Plan Note (Signed)
Gabapentin dose increased to 200 mg 3 times daily.  Continue. ?

## 2021-09-05 NOTE — Assessment & Plan Note (Addendum)
Oxygen dependent.  Again reviewed oxygen saturation goal of 90 to 95% for severe COPD. ?He will let me know name of home health agency and I will request respiratory therapist to come out to the house to evaluate oxygen needs. ?Addendum: Home health agency is [facility] ?

## 2021-09-05 NOTE — Assessment & Plan Note (Addendum)
See below

## 2021-09-05 NOTE — Assessment & Plan Note (Signed)
Severe, oxygen dependent.  ?See below.  ?Continue Trelegy Ellipta. ?

## 2021-09-06 DIAGNOSIS — E1143 Type 2 diabetes mellitus with diabetic autonomic (poly)neuropathy: Secondary | ICD-10-CM | POA: Diagnosis not present

## 2021-09-06 DIAGNOSIS — G709 Myoneural disorder, unspecified: Secondary | ICD-10-CM | POA: Diagnosis not present

## 2021-09-06 DIAGNOSIS — Z7982 Long term (current) use of aspirin: Secondary | ICD-10-CM | POA: Diagnosis not present

## 2021-09-06 DIAGNOSIS — M545 Low back pain, unspecified: Secondary | ICD-10-CM | POA: Diagnosis not present

## 2021-09-06 DIAGNOSIS — E11319 Type 2 diabetes mellitus with unspecified diabetic retinopathy without macular edema: Secondary | ICD-10-CM | POA: Diagnosis not present

## 2021-09-06 DIAGNOSIS — I2510 Atherosclerotic heart disease of native coronary artery without angina pectoris: Secondary | ICD-10-CM | POA: Diagnosis not present

## 2021-09-06 DIAGNOSIS — Z7951 Long term (current) use of inhaled steroids: Secondary | ICD-10-CM | POA: Diagnosis not present

## 2021-09-06 DIAGNOSIS — K219 Gastro-esophageal reflux disease without esophagitis: Secondary | ICD-10-CM | POA: Diagnosis not present

## 2021-09-06 DIAGNOSIS — K5900 Constipation, unspecified: Secondary | ICD-10-CM | POA: Diagnosis not present

## 2021-09-06 DIAGNOSIS — Z9981 Dependence on supplemental oxygen: Secondary | ICD-10-CM | POA: Diagnosis not present

## 2021-09-06 DIAGNOSIS — G8929 Other chronic pain: Secondary | ICD-10-CM | POA: Diagnosis not present

## 2021-09-06 DIAGNOSIS — I499 Cardiac arrhythmia, unspecified: Secondary | ICD-10-CM | POA: Diagnosis not present

## 2021-09-06 DIAGNOSIS — M47812 Spondylosis without myelopathy or radiculopathy, cervical region: Secondary | ICD-10-CM | POA: Diagnosis not present

## 2021-09-06 DIAGNOSIS — E785 Hyperlipidemia, unspecified: Secondary | ICD-10-CM | POA: Diagnosis not present

## 2021-09-06 DIAGNOSIS — H3530 Unspecified macular degeneration: Secondary | ICD-10-CM | POA: Diagnosis not present

## 2021-09-06 DIAGNOSIS — J9611 Chronic respiratory failure with hypoxia: Secondary | ICD-10-CM | POA: Diagnosis not present

## 2021-09-06 DIAGNOSIS — D519 Vitamin B12 deficiency anemia, unspecified: Secondary | ICD-10-CM | POA: Diagnosis not present

## 2021-09-06 DIAGNOSIS — J441 Chronic obstructive pulmonary disease with (acute) exacerbation: Secondary | ICD-10-CM | POA: Diagnosis not present

## 2021-09-06 DIAGNOSIS — G250 Essential tremor: Secondary | ICD-10-CM | POA: Diagnosis not present

## 2021-09-06 DIAGNOSIS — S72002D Fracture of unspecified part of neck of left femur, subsequent encounter for closed fracture with routine healing: Secondary | ICD-10-CM | POA: Diagnosis not present

## 2021-09-06 DIAGNOSIS — I10 Essential (primary) hypertension: Secondary | ICD-10-CM | POA: Diagnosis not present

## 2021-09-06 DIAGNOSIS — Z96642 Presence of left artificial hip joint: Secondary | ICD-10-CM | POA: Diagnosis not present

## 2021-09-07 ENCOUNTER — Telehealth: Payer: Self-pay | Admitting: Family Medicine

## 2021-09-07 ENCOUNTER — Telehealth: Payer: Self-pay

## 2021-09-07 DIAGNOSIS — Z96642 Presence of left artificial hip joint: Secondary | ICD-10-CM | POA: Diagnosis not present

## 2021-09-07 DIAGNOSIS — Z7951 Long term (current) use of inhaled steroids: Secondary | ICD-10-CM | POA: Diagnosis not present

## 2021-09-07 DIAGNOSIS — J9611 Chronic respiratory failure with hypoxia: Secondary | ICD-10-CM | POA: Diagnosis not present

## 2021-09-07 DIAGNOSIS — I2510 Atherosclerotic heart disease of native coronary artery without angina pectoris: Secondary | ICD-10-CM | POA: Diagnosis not present

## 2021-09-07 DIAGNOSIS — H3530 Unspecified macular degeneration: Secondary | ICD-10-CM | POA: Diagnosis not present

## 2021-09-07 DIAGNOSIS — M545 Low back pain, unspecified: Secondary | ICD-10-CM | POA: Diagnosis not present

## 2021-09-07 DIAGNOSIS — M47812 Spondylosis without myelopathy or radiculopathy, cervical region: Secondary | ICD-10-CM | POA: Diagnosis not present

## 2021-09-07 DIAGNOSIS — J441 Chronic obstructive pulmonary disease with (acute) exacerbation: Secondary | ICD-10-CM | POA: Diagnosis not present

## 2021-09-07 DIAGNOSIS — I499 Cardiac arrhythmia, unspecified: Secondary | ICD-10-CM | POA: Diagnosis not present

## 2021-09-07 DIAGNOSIS — K219 Gastro-esophageal reflux disease without esophagitis: Secondary | ICD-10-CM | POA: Diagnosis not present

## 2021-09-07 DIAGNOSIS — K5900 Constipation, unspecified: Secondary | ICD-10-CM | POA: Diagnosis not present

## 2021-09-07 DIAGNOSIS — S72002D Fracture of unspecified part of neck of left femur, subsequent encounter for closed fracture with routine healing: Secondary | ICD-10-CM | POA: Diagnosis not present

## 2021-09-07 DIAGNOSIS — G8929 Other chronic pain: Secondary | ICD-10-CM | POA: Diagnosis not present

## 2021-09-07 DIAGNOSIS — E1143 Type 2 diabetes mellitus with diabetic autonomic (poly)neuropathy: Secondary | ICD-10-CM | POA: Diagnosis not present

## 2021-09-07 DIAGNOSIS — E11319 Type 2 diabetes mellitus with unspecified diabetic retinopathy without macular edema: Secondary | ICD-10-CM | POA: Diagnosis not present

## 2021-09-07 DIAGNOSIS — Z9981 Dependence on supplemental oxygen: Secondary | ICD-10-CM | POA: Diagnosis not present

## 2021-09-07 DIAGNOSIS — I10 Essential (primary) hypertension: Secondary | ICD-10-CM | POA: Diagnosis not present

## 2021-09-07 DIAGNOSIS — E785 Hyperlipidemia, unspecified: Secondary | ICD-10-CM | POA: Diagnosis not present

## 2021-09-07 DIAGNOSIS — G250 Essential tremor: Secondary | ICD-10-CM | POA: Diagnosis not present

## 2021-09-07 DIAGNOSIS — Z7982 Long term (current) use of aspirin: Secondary | ICD-10-CM | POA: Diagnosis not present

## 2021-09-07 DIAGNOSIS — G709 Myoneural disorder, unspecified: Secondary | ICD-10-CM | POA: Diagnosis not present

## 2021-09-07 DIAGNOSIS — D519 Vitamin B12 deficiency anemia, unspecified: Secondary | ICD-10-CM | POA: Diagnosis not present

## 2021-09-07 NOTE — Telephone Encounter (Signed)
Home Health verbal orders ?Caller Name:Gordon ?Agency Name: [facility] ? ?Callback number: [phone] ? ?Requesting OT/PT/Skilled nursing/Social Work/Speech: ? ?Reason:PT ? ?Frequency:1wk for 1wk, 2wks for 4wks, 1wk for 4wks ? ?Please forward to [facility] pool or providers CMA  ?

## 2021-09-07 NOTE — Telephone Encounter (Signed)
Clova from [place] called and states that she did an assessment on pt tpday and determined he will need PT, OT and skilled nursing.  ?She does not know the frequency for the therapies yet but nursing she would like: ?Once a week x 6 weeks ? ?Please advise ? ? ?

## 2021-09-08 NOTE — Telephone Encounter (Signed)
Agree with verbal order for this.  ?I also placed a referral for respiratory therapy to come out to his house for evaluation - did they receive this request?  ?

## 2021-09-08 NOTE — Telephone Encounter (Signed)
Verbal order given to [place] by telephone as instructed. ?

## 2021-09-08 NOTE — Telephone Encounter (Signed)
Agree with verbal orders for PT.  ?

## 2021-09-08 NOTE — Telephone Encounter (Signed)
Left message on voicemail to call the office back. 

## 2021-09-11 NOTE — Telephone Encounter (Signed)
Left a message for Clova to call the office back. ?

## 2021-09-12 DIAGNOSIS — I10 Essential (primary) hypertension: Secondary | ICD-10-CM | POA: Diagnosis not present

## 2021-09-12 DIAGNOSIS — D519 Vitamin B12 deficiency anemia, unspecified: Secondary | ICD-10-CM | POA: Diagnosis not present

## 2021-09-12 DIAGNOSIS — Z9981 Dependence on supplemental oxygen: Secondary | ICD-10-CM | POA: Diagnosis not present

## 2021-09-12 DIAGNOSIS — J441 Chronic obstructive pulmonary disease with (acute) exacerbation: Secondary | ICD-10-CM | POA: Diagnosis not present

## 2021-09-12 DIAGNOSIS — Z7951 Long term (current) use of inhaled steroids: Secondary | ICD-10-CM | POA: Diagnosis not present

## 2021-09-12 DIAGNOSIS — G8929 Other chronic pain: Secondary | ICD-10-CM | POA: Diagnosis not present

## 2021-09-12 DIAGNOSIS — M545 Low back pain, unspecified: Secondary | ICD-10-CM | POA: Diagnosis not present

## 2021-09-12 DIAGNOSIS — M47812 Spondylosis without myelopathy or radiculopathy, cervical region: Secondary | ICD-10-CM | POA: Diagnosis not present

## 2021-09-12 DIAGNOSIS — I499 Cardiac arrhythmia, unspecified: Secondary | ICD-10-CM | POA: Diagnosis not present

## 2021-09-12 DIAGNOSIS — E1143 Type 2 diabetes mellitus with diabetic autonomic (poly)neuropathy: Secondary | ICD-10-CM | POA: Diagnosis not present

## 2021-09-12 DIAGNOSIS — Z96642 Presence of left artificial hip joint: Secondary | ICD-10-CM | POA: Diagnosis not present

## 2021-09-12 DIAGNOSIS — K5900 Constipation, unspecified: Secondary | ICD-10-CM | POA: Diagnosis not present

## 2021-09-12 DIAGNOSIS — Z7982 Long term (current) use of aspirin: Secondary | ICD-10-CM | POA: Diagnosis not present

## 2021-09-12 DIAGNOSIS — S72002D Fracture of unspecified part of neck of left femur, subsequent encounter for closed fracture with routine healing: Secondary | ICD-10-CM | POA: Diagnosis not present

## 2021-09-12 DIAGNOSIS — I2510 Atherosclerotic heart disease of native coronary artery without angina pectoris: Secondary | ICD-10-CM | POA: Diagnosis not present

## 2021-09-12 DIAGNOSIS — E785 Hyperlipidemia, unspecified: Secondary | ICD-10-CM | POA: Diagnosis not present

## 2021-09-12 DIAGNOSIS — H3530 Unspecified macular degeneration: Secondary | ICD-10-CM | POA: Diagnosis not present

## 2021-09-12 DIAGNOSIS — G709 Myoneural disorder, unspecified: Secondary | ICD-10-CM | POA: Diagnosis not present

## 2021-09-12 DIAGNOSIS — E11319 Type 2 diabetes mellitus with unspecified diabetic retinopathy without macular edema: Secondary | ICD-10-CM | POA: Diagnosis not present

## 2021-09-12 DIAGNOSIS — K219 Gastro-esophageal reflux disease without esophagitis: Secondary | ICD-10-CM | POA: Diagnosis not present

## 2021-09-12 DIAGNOSIS — G250 Essential tremor: Secondary | ICD-10-CM | POA: Diagnosis not present

## 2021-09-12 DIAGNOSIS — J9611 Chronic respiratory failure with hypoxia: Secondary | ICD-10-CM | POA: Diagnosis not present

## 2021-09-12 NOTE — Telephone Encounter (Signed)
Home health nurse has not called back. ? ?Called the main number for [facility] [phone] and spoke to [facility] and advised her that Dr. Danise Mina agrees with the verbal order.  ?Estill Bamberg stated that they are not able to provide respiratory therapy because they do not have a respiratory therapist on staff. ?

## 2021-09-13 ENCOUNTER — Telehealth: Payer: Self-pay

## 2021-09-13 NOTE — Chronic Care Management (AMB) (Signed)
? ? ?Chronic Care Management ?Pharmacy Assistant  ? ?Name: Joseph Graham  MRN: [id number] DOB: Oct 26, 1940 ? ? ?Reason for Encounter: Hypertension Disease State ?  ? ?Recent office visits:  ?09/04/21-PCP-Javier Gutierrez,MD-Telemedicine-Med changes while in SNF - gabapentin increased to 200mg  TID for tremor. Recent hospitalization for L hip fracture after fall at home, s/p L hip hemiarthroplasty on 08/19/2021 (Dr Gaspar Bidding). HH respiratory referral.Continue aspirin for DVT ppx for now ? ?Recent consult visits:  ?07/27/21-Oncology-Hospice Medicine-Telemedicine-no medication changes ? ?Hospital visits:  ?Medication Reconciliation was completed by comparing discharge summary, patient?s EMR and Pharmacy list, and upon discussion with patient. ? ?Admitted to the hospital on 08/18/21 due to Left hip fracture . Discharge date was 08/22/21. Discharged from [facility]   ? ?Medication Changes at Hospital Discharge: ?-Changed Gabapentin  ? ?Medications Discontinued at Hospital Discharge: ?-Stopped Azelastine 0.1% nasal spray ? ?Medications that remain the same after Hospital Discharge:??  ?-All other medications will remain the same.   ? ?Medications: ?Outpatient Encounter Medications as of 09/13/2021  ?Medication Sig  ? albuterol (VENTOLIN HFA) 108 (90 Base) MCG/ACT inhaler Inhale 2 puffs into the lungs every 6 (six) hours as needed for wheezing or shortness of breath.  ? ASPIRIN 81 PO Take 1 tablet by mouth daily.  ? Fluticasone-Umeclidin-Vilant (TRELEGY ELLIPTA) 100-62.5-25 MCG/ACT AEPB Inhale 1 puff into the lungs daily.  ? furosemide (LASIX) 40 MG tablet Take 1 tablet (40 mg total) by mouth every other day.  ? gabapentin (NEURONTIN) 100 MG capsule Take 2 capsules (200 mg total) by mouth 3 (three) times daily.  ? HYDROcodone-acetaminophen (NORCO) 7.5-325 MG tablet Take 1 tablet by mouth every 4 (four) hours as needed for moderate pain.  ? lisinopril (ZESTRIL) 10 MG tablet Take 1 tablet (10 mg total) by mouth every evening.  ?  metoprolol tartrate (LOPRESSOR) 25 MG tablet Take 1 tablet (25 mg total) by mouth daily.  ? Multiple Vitamin (MULTIVITAMIN WITH MINERALS) TABS tablet Take 1 tablet by mouth every evening.  ? naloxone (NARCAN) nasal spray 4 mg/0.1 mL SPRAY 1 SPRAY INTO ONE NOSTRIL AS DIRECTED FOR OPIOID OVERDOSE (TURN PERSON ON SIDE AFTER DOSE. IF NO RESPONSE IN 2-3 MINUTES OR PERSON RESPONDS BUT RELAPSES, REPEAT USING A NEW SPRAY DEVICE AND SPRAY INTO THE OTHER NOSTRIL. CALL 911 AFTER USE.) * EMERGENCY USE ONLY *  ? omeprazole (PRILOSEC) 20 MG capsule Take 20 mg by mouth every morning.   ? OXYGEN Inhale 2 L into the lungs daily. Using 4 L  ? oxymetazoline (AFRIN) 0.05 % nasal spray Place 1 spray into both nostrils 2 (two) times daily. As needed  ? polyethylene glycol (MIRALAX / GLYCOLAX) packet Take 17 g by mouth daily.  ? potassium chloride (KLOR-CON) 10 MEQ tablet Take 1 tablet (10 mEq total) by mouth every other day.  ? vitamin B-12 (CYANOCOBALAMIN) 100 MCG tablet Take 100 mcg by mouth every evening.  ? vitamin E 400 UNIT capsule Take 400 Units by mouth every evening.   ? ?No facility-administered encounter medications on file as of 09/13/2021.  ? ? ? ?Recent Office Vitals: ?BP Readings from Last 3 Encounters:  ?09/04/21 139/80  ?08/22/21 (!) 118/48  ?06/14/21 (!) 162/58  ? ?Pulse Readings from Last 3 Encounters:  ?09/04/21 64  ?08/22/21 88  ?06/14/21 87  ?  ?Wt Readings from Last 3 Encounters:  ?09/04/21 160 lb (72.6 kg)  ?08/18/21 160 lb (72.6 kg)  ?06/14/21 156 lb (70.8 kg)  ?  ? ?Kidney Function ?Lab Results  ?Component Value  Date/Time  ? CREATININE 0.69 08/19/2021 05:36 AM  ? CREATININE 0.90 08/18/2021 01:20 PM  ? CREATININE 0.68 08/08/2010 12:00 AM  ? GFR 96.08 07/12/2020 07:57 AM  ? GFRNONAA >60 08/19/2021 05:36 AM  ? GFRAA >60 02/24/2020 08:21 AM  ? ? ? ?  Latest Ref Rng & Units 08/19/2021  ?  5:36 AM 08/18/2021  ?  1:20 PM 01/05/2021  ?  2:43 PM  ?BMP  ?Glucose 70 - 99 mg/dL 97   75     ?BUN 8 - 23 mg/dL 27   33      ?Creatinine 0.61 - 1.24 mg/dL 0.69   0.90   0.60    ?Sodium 135 - 145 mmol/L 138   140     ?Potassium 3.5 - 5.1 mmol/L 3.9   4.3     ?Chloride 98 - 111 mmol/L 99   99     ?CO2 22 - 32 mmol/L 28   30     ?Calcium 8.9 - 10.3 mg/dL 9.0   9.2     ? ? ? ?Attempted contact with [name] 3 times on 09/13/21,09/14/21,09/15/21. Unsuccessful outreach. Will attempt contact next month.  ? ?Current antihypertensive regimen:  ?Lisinopril 10 mg - 1 tablet daily ?Metoprolol tartrate 25 mg - 1/2 tablet twice daily ?Potassium chloride 10 mEq - 1 tablet every other day (with Lasix) ?Lasix 40 mg - 1 tablet every other day (for skin weeping) ? ? ? ?Any recent hospitalizations or ED visits since last visit with CPP? Yes  [facility]  ? ?Adherence Review: ?Is the patient currently on ACE/ARB medication? Yes ?Does the patient have >5 day gap between last estimated fill dates? No ? ? ?Star Rating Drugs:  ?Medication:  Last Fill: Day Supply ?Lisinopril 10mg  09/01/21 30 ? ? ?Upcoming appointments: ?PCP appointment on 09/18/21 ? ? ?Charlene Brooke, CPP notified ? ?Haylee Mcanany, CCMA ?Health concierge  ?[phone]  ?

## 2021-09-14 DIAGNOSIS — E1143 Type 2 diabetes mellitus with diabetic autonomic (poly)neuropathy: Secondary | ICD-10-CM | POA: Diagnosis not present

## 2021-09-14 DIAGNOSIS — G8929 Other chronic pain: Secondary | ICD-10-CM | POA: Diagnosis not present

## 2021-09-14 DIAGNOSIS — I10 Essential (primary) hypertension: Secondary | ICD-10-CM | POA: Diagnosis not present

## 2021-09-14 DIAGNOSIS — H3530 Unspecified macular degeneration: Secondary | ICD-10-CM | POA: Diagnosis not present

## 2021-09-14 DIAGNOSIS — K219 Gastro-esophageal reflux disease without esophagitis: Secondary | ICD-10-CM | POA: Diagnosis not present

## 2021-09-14 DIAGNOSIS — E785 Hyperlipidemia, unspecified: Secondary | ICD-10-CM | POA: Diagnosis not present

## 2021-09-14 DIAGNOSIS — D519 Vitamin B12 deficiency anemia, unspecified: Secondary | ICD-10-CM | POA: Diagnosis not present

## 2021-09-14 DIAGNOSIS — M545 Low back pain, unspecified: Secondary | ICD-10-CM | POA: Diagnosis not present

## 2021-09-14 DIAGNOSIS — Z96642 Presence of left artificial hip joint: Secondary | ICD-10-CM | POA: Diagnosis not present

## 2021-09-14 DIAGNOSIS — K5900 Constipation, unspecified: Secondary | ICD-10-CM | POA: Diagnosis not present

## 2021-09-14 DIAGNOSIS — Z7951 Long term (current) use of inhaled steroids: Secondary | ICD-10-CM | POA: Diagnosis not present

## 2021-09-14 DIAGNOSIS — I499 Cardiac arrhythmia, unspecified: Secondary | ICD-10-CM | POA: Diagnosis not present

## 2021-09-14 DIAGNOSIS — E11319 Type 2 diabetes mellitus with unspecified diabetic retinopathy without macular edema: Secondary | ICD-10-CM | POA: Diagnosis not present

## 2021-09-14 DIAGNOSIS — Z9981 Dependence on supplemental oxygen: Secondary | ICD-10-CM | POA: Diagnosis not present

## 2021-09-14 DIAGNOSIS — M47812 Spondylosis without myelopathy or radiculopathy, cervical region: Secondary | ICD-10-CM | POA: Diagnosis not present

## 2021-09-14 DIAGNOSIS — S72002D Fracture of unspecified part of neck of left femur, subsequent encounter for closed fracture with routine healing: Secondary | ICD-10-CM | POA: Diagnosis not present

## 2021-09-14 DIAGNOSIS — G709 Myoneural disorder, unspecified: Secondary | ICD-10-CM | POA: Diagnosis not present

## 2021-09-14 DIAGNOSIS — G250 Essential tremor: Secondary | ICD-10-CM | POA: Diagnosis not present

## 2021-09-14 DIAGNOSIS — Z7982 Long term (current) use of aspirin: Secondary | ICD-10-CM | POA: Diagnosis not present

## 2021-09-14 DIAGNOSIS — I2510 Atherosclerotic heart disease of native coronary artery without angina pectoris: Secondary | ICD-10-CM | POA: Diagnosis not present

## 2021-09-14 DIAGNOSIS — J441 Chronic obstructive pulmonary disease with (acute) exacerbation: Secondary | ICD-10-CM | POA: Diagnosis not present

## 2021-09-14 DIAGNOSIS — J9611 Chronic respiratory failure with hypoxia: Secondary | ICD-10-CM | POA: Diagnosis not present

## 2021-09-14 NOTE — Telephone Encounter (Signed)
Clova also called back and was advised of verbal order.  ? ?Clova also wanted to let Dr Darnell Level know that patient has been saying that he does not have diabetes even though he has that diagnoses. ? ?Also, patient has moved up his O2 levels to 6 liters but Clova does not feel like he really needs that amount. He gets more SOB on exertion but today his level of O2 at rest was 100% today. ?FYI ?

## 2021-09-18 ENCOUNTER — Encounter: Payer: Self-pay | Admitting: Family Medicine

## 2021-09-18 ENCOUNTER — Other Ambulatory Visit: Payer: Self-pay | Admitting: Hospice and Palliative Medicine

## 2021-09-18 ENCOUNTER — Ambulatory Visit (INDEPENDENT_AMBULATORY_CARE_PROVIDER_SITE_OTHER): Payer: Medicare Other | Admitting: Family Medicine

## 2021-09-18 ENCOUNTER — Other Ambulatory Visit: Payer: Self-pay | Admitting: *Deleted

## 2021-09-18 VITALS — BP 128/62 | HR 50 | Temp 97.8°F | Ht 68.0 in

## 2021-09-18 DIAGNOSIS — J9611 Chronic respiratory failure with hypoxia: Secondary | ICD-10-CM | POA: Diagnosis not present

## 2021-09-18 DIAGNOSIS — T451X5A Adverse effect of antineoplastic and immunosuppressive drugs, initial encounter: Secondary | ICD-10-CM | POA: Diagnosis not present

## 2021-09-18 DIAGNOSIS — J439 Emphysema, unspecified: Secondary | ICD-10-CM | POA: Diagnosis not present

## 2021-09-18 DIAGNOSIS — E782 Mixed hyperlipidemia: Secondary | ICD-10-CM | POA: Diagnosis not present

## 2021-09-18 DIAGNOSIS — J449 Chronic obstructive pulmonary disease, unspecified: Secondary | ICD-10-CM | POA: Diagnosis not present

## 2021-09-18 DIAGNOSIS — G620 Drug-induced polyneuropathy: Secondary | ICD-10-CM

## 2021-09-18 DIAGNOSIS — S72002D Fracture of unspecified part of neck of left femur, subsequent encounter for closed fracture with routine healing: Secondary | ICD-10-CM | POA: Diagnosis not present

## 2021-09-18 DIAGNOSIS — Z79899 Other long term (current) drug therapy: Secondary | ICD-10-CM | POA: Diagnosis not present

## 2021-09-18 DIAGNOSIS — I10 Essential (primary) hypertension: Secondary | ICD-10-CM

## 2021-09-18 DIAGNOSIS — E118 Type 2 diabetes mellitus with unspecified complications: Secondary | ICD-10-CM | POA: Diagnosis not present

## 2021-09-18 DIAGNOSIS — C3411 Malignant neoplasm of upper lobe, right bronchus or lung: Secondary | ICD-10-CM | POA: Diagnosis not present

## 2021-09-18 LAB — COMPREHENSIVE METABOLIC PANEL
ALT: 10 U/L (ref 0–53)
AST: 19 U/L (ref 0–37)
Albumin: 3.9 g/dL (ref 3.5–5.2)
Alkaline Phosphatase: 100 U/L (ref 39–117)
BUN: 27 mg/dL — ABNORMAL HIGH (ref 6–23)
CO2: 32 mEq/L (ref 19–32)
Calcium: 9.4 mg/dL (ref 8.4–10.5)
Chloride: 102 mEq/L (ref 96–112)
Creatinine, Ser: 0.66 mg/dL (ref 0.40–1.50)
GFR: 88.15 mL/min (ref >60.00)
Glucose, Bld: 96 mg/dL (ref 70–99)
Potassium: 4.5 mEq/L (ref 3.5–5.1)
Sodium: 141 mEq/L (ref 135–145)
Total Bilirubin: 0.3 mg/dL (ref 0.2–1.2)
Total Protein: 7.6 g/dL (ref 6.0–8.3)

## 2021-09-18 LAB — CBC WITH DIFFERENTIAL/PLATELET
Basophils Absolute: 0.1 10*3/uL (ref 0.0–0.1)
Basophils Relative: 1.2 % (ref 0.0–3.0)
Eosinophils Absolute: 0.1 10*3/uL (ref 0.0–0.7)
Eosinophils Relative: 1.4 % (ref 0.0–5.0)
HCT: 27.4 % — ABNORMAL LOW (ref 39.0–52.0)
Hemoglobin: 9 g/dL — ABNORMAL LOW (ref 13.0–17.0)
Lymphocytes Relative: 16 % (ref 12.0–46.0)
Lymphs Abs: 1.1 10*3/uL (ref 0.7–4.0)
MCHC: 33 g/dL (ref 30.0–36.0)
MCV: 95.4 fl (ref 78.0–100.0)
Monocytes Absolute: 0.6 10*3/uL (ref 0.1–1.0)
Monocytes Relative: 8.2 % (ref 3.0–12.0)
Neutro Abs: 4.9 10*3/uL (ref 1.4–7.7)
Neutrophils Relative %: 73.2 % (ref 43.0–77.0)
Platelets: 341 10*3/uL (ref 150.0–400.0)
RBC: 2.87 Mil/uL — ABNORMAL LOW (ref 4.22–5.81)
RDW: 16.1 % — ABNORMAL HIGH (ref 11.5–15.5)
WBC: 6.7 10*3/uL (ref 4.0–10.5)

## 2021-09-18 LAB — HEMOGLOBIN A1C: Hgb A1c MFr Bld: 5.7 % (ref 4.6–6.5)

## 2021-09-18 MED ORDER — HYDROCODONE-ACETAMINOPHEN 7.5-325 MG PO TABS: 1.0000 | ORAL_TABLET | ORAL | 0 refills | Status: DC | PRN

## 2021-09-18 NOTE — Assessment & Plan Note (Signed)
Not fasting today.  ?

## 2021-09-18 NOTE — Assessment & Plan Note (Signed)
H/o diet controlled diabetes. ?Update A1c - anticipate improvement with noted weight loss.  ?

## 2021-09-18 NOTE — Telephone Encounter (Signed)
Tried to call patient and mailbox was full. Will have to call back later. ?

## 2021-09-18 NOTE — Patient Instructions (Addendum)
Labs today  ?Try 1/2 tablet metoprolol (12.5mg  daily) and if BP rises, then increase back to 1 tablet daily.  ?You should only be on Ellipta 100 dose - ensure this dose is refilled next time.  ?Return as needed or in 4-6 months for physical/wellness visit.  ?

## 2021-09-18 NOTE — Assessment & Plan Note (Signed)
Stable period on gabapentin 200mg  BID.  ?

## 2021-09-18 NOTE — Assessment & Plan Note (Signed)
Oxygen generator working much better since this was serviced.  ?

## 2021-09-18 NOTE — Assessment & Plan Note (Signed)
Recovering well after hip fracture repair surgery.  ?Pending ortho appt next week.  ?

## 2021-09-18 NOTE — Assessment & Plan Note (Signed)
BP stable, given bradycardia will trial lower metoprolol dose 25mg  1/2 tab daily.  ?

## 2021-09-18 NOTE — Progress Notes (Signed)
? ? Patient ID: Joseph Graham, male    DOB: [birth year], 81 y.o.   MRN: [id number] ? ?This visit was conducted in person. ? ?BP 128/62   Pulse (!) 50   Temp 97.8 ?F (36.6 ?C) (Temporal)   Ht 5\' 8"  (1.727 m)   SpO2 93% Comment: 2 L  BMI 24.33 kg/m?   ? ?CC: hosp f/u visit  ?Subjective:  ? ?HPI: ?Joseph Graham is a 81 y.o. male presenting on 09/18/2021 for Hospitalization Follow-up (Here for rehab f/u due to L hip fx.  Pt accompanied by wife, Mardene Celeste. ) ? ? ?See prior note for details.  ? ?[place] doesn't have RT available. Discussed pulm referral for further evaluation.  ? ?Known severe COPD bullous emphysema on trelegy ellipta and supplemental oxygen. Currently on 2L , at home increases to 5-6L due to resistance of long tubing which he uses to go upstairs (where he lives).  ? ?H/o lung cancer s/p XRT, now on surveillance Rogue Bussing).  ? ?DM - diet dependent. Update A1c today.  ?Lab Results  ?Component Value Date  ? HGBA1C 6.6 (H) 07/12/2020  ?  ? ?DME - [facility] finally came out to the house. 6 months overdue for generator cleaning, new filter - he's now been able to drop O2 back to 2.5L with good effect - even upstairs.   ? ?Recent L hip replacement - getting HHPT. Planning to see ortho next week. He is ambulatory but today walks in with wheelchair. He continues hydrocodone Q6 hours PRN.  ? ? ?Hospital admission: 08/18/2021 ?Hospital discharge: 08/22/2021 ?TCM f/u phone call: not performed  ?  ?Recommendations at discharge:  ? Follow up with orthopedic surgery for staple removal within 2 weeks ?  ?Discharge Diagnoses: ?Principal Problem: ?  Hip fracture ([place]) ?Active Problems: ?  HTN (hypertension) ?  Benign essential tremor ?  COPD, severe ([place]) ?  Chronic respiratory failure with hypoxia (HCC) ?   ? ?Relevant past medical, surgical, family and social history reviewed and updated as indicated. Interim medical history since our last visit reviewed. ?Allergies and medications reviewed and  updated. ?Outpatient Medications Prior to Visit  ?Medication Sig Dispense Refill  ? albuterol (VENTOLIN HFA) 108 (90 Base) MCG/ACT inhaler Inhale 2 puffs into the lungs every 6 (six) hours as needed for wheezing or shortness of breath. 18 g 3  ? ASPIRIN 81 PO Take 1 tablet by mouth daily.    ? Fluticasone-Umeclidin-Vilant (TRELEGY ELLIPTA) 100-62.5-25 MCG/ACT AEPB Inhale 1 puff into the lungs daily. 1 each 3  ? furosemide (LASIX) 40 MG tablet Take 1 tablet (40 mg total) by mouth every other day. (Patient taking differently: Take 40 mg by mouth every other day. As needed) 45 tablet 0  ? gabapentin (NEURONTIN) 100 MG capsule Take 2 capsules (200 mg total) by mouth 3 (three) times daily.    ? HYDROcodone-acetaminophen (NORCO) 7.5-325 MG tablet Take 1 tablet by mouth every 4 (four) hours as needed for moderate pain. 30 tablet 0  ? lisinopril (ZESTRIL) 10 MG tablet Take 1 tablet (10 mg total) by mouth every evening. 90 tablet 1  ? metoprolol tartrate (LOPRESSOR) 25 MG tablet Take 1 tablet (25 mg total) by mouth daily. 90 tablet 1  ? Multiple Vitamin (MULTIVITAMIN WITH MINERALS) TABS tablet Take 1 tablet by mouth every evening.    ? omeprazole (PRILOSEC) 20 MG capsule Take 20 mg by mouth every morning.     ? OXYGEN Inhale 2 L into the lungs daily.    ?  oxymetazoline (AFRIN) 0.05 % nasal spray Place 1 spray into both nostrils 2 (two) times daily. As needed    ? polyethylene glycol (MIRALAX / GLYCOLAX) packet Take 17 g by mouth daily.    ? potassium chloride (KLOR-CON) 10 MEQ tablet Take 1 tablet (10 mEq total) by mouth every other day. (Patient taking differently: Take 10 mEq by mouth every other day. As needed with Lasix) 45 tablet 0  ? vitamin B-12 (CYANOCOBALAMIN) 100 MCG tablet Take 100 mcg by mouth every evening.    ? vitamin E 400 UNIT capsule Take 400 Units by mouth every evening.     ? naloxone (NARCAN) nasal spray 4 mg/0.1 mL SPRAY 1 SPRAY INTO ONE NOSTRIL AS DIRECTED FOR OPIOID OVERDOSE (TURN PERSON ON SIDE AFTER  DOSE. IF NO RESPONSE IN 2-3 MINUTES OR PERSON RESPONDS BUT RELAPSES, REPEAT USING A NEW SPRAY DEVICE AND SPRAY INTO THE OTHER NOSTRIL. CALL 911 AFTER USE.) * EMERGENCY USE ONLY * 1 each 0  ? ?No facility-administered medications prior to visit.  ?  ? ?Per HPI unless specifically indicated in ROS section below ?Review of Systems ? ?Objective:  ?BP 128/62   Pulse (!) 50   Temp 97.8 ?F (36.6 ?C) (Temporal)   Ht 5\' 8"  (1.727 m)   SpO2 93% Comment: 2 L  BMI 24.33 kg/m?   ?Wt Readings from Last 3 Encounters:  ?09/04/21 160 lb (72.6 kg)  ?08/18/21 160 lb (72.6 kg)  ?06/14/21 156 lb (70.8 kg)  ?  ?  ?Physical Exam ?Vitals and nursing note reviewed.  ?Constitutional:   ?   Appearance: Normal appearance. He is not ill-appearing.  ?   Comments: [place] in place at 2L, in wheelchair  ?Cardiovascular:  ?   Rate and Rhythm: Regular rhythm. Bradycardia present.  ?   Pulses: Normal pulses.  ?   Heart sounds: Normal heart sounds. No murmur heard. ?Pulmonary:  ?   Effort: Pulmonary effort is normal. No respiratory distress.  ?   Breath sounds: No rhonchi or rales.  ?   Comments: Coarse throughout ?Musculoskeletal:  ?   Right lower leg: No edema.  ?   Left lower leg: No edema.  ?   Comments: ACE wraps to BLE  ?Skin: ?   General: Skin is warm and dry.  ?   Findings: No rash.  ?Neurological:  ?   Mental Status: He is alert.  ?Psychiatric:     ?   Mood and Affect: Mood normal.     ?   Behavior: Behavior normal.  ? ?   ? ?Lab Results  ?Component Value Date  ? HGBA1C 6.6 (H) 07/12/2020  ?  ?Lab Results  ?Component Value Date  ? CREATININE 0.69 08/19/2021  ? BUN 27 (H) 08/19/2021  ? NA 138 08/19/2021  ? K 3.9 08/19/2021  ? CL 99 08/19/2021  ? CO2 28 08/19/2021  ?  ?Lab Results  ?Component Value Date  ? WBC 11.9 (H) 08/19/2021  ? HGB 9.7 (L) 08/19/2021  ? HCT 30.3 (L) 08/19/2021  ? MCV 97.1 08/19/2021  ? PLT 224 08/19/2021  ?  ?Lab Results  ?Component Value Date  ? CHOL 168 07/12/2020  ? HDL 49.60 07/12/2020  ? [place] 99 07/12/2020  ?  LDLDIRECT 105 08/08/2010  ? TRIG 99.0 07/12/2020  ? CHOLHDL 3 07/12/2020  ? ?Assessment & Plan:  ? ?Problem List Items Addressed This Visit   ? ? Peripheral neuropathy due to chemotherapy (HCC) (Chronic)  ?  Stable period  on gabapentin 200mg  BID.  ?  ?  ? HTN (hypertension)  ?  BP stable, given bradycardia will trial lower metoprolol dose 25mg  1/2 tab daily.  ?  ?  ? Controlled diabetes mellitus type 2 with complications ([place])  ?  H/o diet controlled diabetes. ?Update A1c - anticipate improvement with noted weight loss.  ?  ?  ? Relevant Orders  ? Hemoglobin A1c  ? Comprehensive metabolic panel  ? CBC with Differential/Platelet  ? COPD, severe ([place]) - Primary  ?  Severe oxygen dependent, continues Trelegy Ellipta.  ?He was dispensed 218mcg dose from pharmacy on latest refill without significant change noted - advised to ensure he receives 158mcg dose next fill.  ?O2 is working much better since he had generator serviced. Has dropped O2 use to 2-2.5L at home.  ?  ?  ? Malignant neoplasm of right upper lobe of lung ([place])  ? HLD (hyperlipidemia)  ?  Not fasting today.  ?  ?  ? Polypharmacy  ? Chronic respiratory failure with hypoxia (HCC)  ?  Oxygen generator working much better since this was serviced.  ?  ?  ? Bullous emphysema ([place])  ? Hip fracture ([place])  ?  Recovering well after hip fracture repair surgery.  ?Pending ortho appt next week.  ?  ?  ?  ? ?No orders of the defined types were placed in this encounter. ? ?Orders Placed This Encounter  ?Procedures  ? Hemoglobin A1c  ? Comprehensive metabolic panel  ? CBC with Differential/Platelet  ? ? ?Patient Instructions  ?Labs today  ?Try 1/2 tablet metoprolol (12.5mg  daily) and if BP rises, then increase back to 1 tablet daily.  ?You should only be on Ellipta 100 dose - ensure this dose is refilled next time.  ?Return as needed or in 4-6 months for physical/wellness visit.  ? ?Follow up plan: ?Return in about 6 months (around 03/20/2022) for medicare wellness visit,  annual exam, prior fasting for blood work. ? ?[name], MD   ?

## 2021-09-18 NOTE — Telephone Encounter (Signed)
Plz notify patient - [facility] doesn't have respiratory therapy available. If ongoing difficulty with oxygen generator, would suggest referral to lung doctor for further recommendations.  ?

## 2021-09-18 NOTE — Assessment & Plan Note (Signed)
Severe oxygen dependent, continues Trelegy Ellipta.  ?He was dispensed 265mcg dose from pharmacy on latest refill without significant change noted - advised to ensure he receives 169mcg dose next fill.  ?O2 is working much better since he had generator serviced. Has dropped O2 use to 2-2.5L at home.  ?

## 2021-09-19 ENCOUNTER — Other Ambulatory Visit: Payer: Self-pay | Admitting: Family Medicine

## 2021-09-19 DIAGNOSIS — S72002D Fracture of unspecified part of neck of left femur, subsequent encounter for closed fracture with routine healing: Secondary | ICD-10-CM | POA: Diagnosis not present

## 2021-09-19 DIAGNOSIS — D519 Vitamin B12 deficiency anemia, unspecified: Secondary | ICD-10-CM | POA: Diagnosis not present

## 2021-09-19 DIAGNOSIS — M545 Low back pain, unspecified: Secondary | ICD-10-CM | POA: Diagnosis not present

## 2021-09-19 DIAGNOSIS — I10 Essential (primary) hypertension: Secondary | ICD-10-CM | POA: Diagnosis not present

## 2021-09-19 DIAGNOSIS — Z96642 Presence of left artificial hip joint: Secondary | ICD-10-CM | POA: Diagnosis not present

## 2021-09-19 DIAGNOSIS — G250 Essential tremor: Secondary | ICD-10-CM | POA: Diagnosis not present

## 2021-09-19 DIAGNOSIS — G709 Myoneural disorder, unspecified: Secondary | ICD-10-CM | POA: Diagnosis not present

## 2021-09-19 DIAGNOSIS — D649 Anemia, unspecified: Secondary | ICD-10-CM

## 2021-09-19 DIAGNOSIS — K219 Gastro-esophageal reflux disease without esophagitis: Secondary | ICD-10-CM | POA: Diagnosis not present

## 2021-09-19 DIAGNOSIS — M47812 Spondylosis without myelopathy or radiculopathy, cervical region: Secondary | ICD-10-CM | POA: Diagnosis not present

## 2021-09-19 DIAGNOSIS — Z7951 Long term (current) use of inhaled steroids: Secondary | ICD-10-CM | POA: Diagnosis not present

## 2021-09-19 DIAGNOSIS — E11319 Type 2 diabetes mellitus with unspecified diabetic retinopathy without macular edema: Secondary | ICD-10-CM | POA: Diagnosis not present

## 2021-09-19 DIAGNOSIS — H3530 Unspecified macular degeneration: Secondary | ICD-10-CM | POA: Diagnosis not present

## 2021-09-19 DIAGNOSIS — J9611 Chronic respiratory failure with hypoxia: Secondary | ICD-10-CM | POA: Diagnosis not present

## 2021-09-19 DIAGNOSIS — G8929 Other chronic pain: Secondary | ICD-10-CM | POA: Diagnosis not present

## 2021-09-19 DIAGNOSIS — I499 Cardiac arrhythmia, unspecified: Secondary | ICD-10-CM | POA: Diagnosis not present

## 2021-09-19 DIAGNOSIS — E785 Hyperlipidemia, unspecified: Secondary | ICD-10-CM | POA: Diagnosis not present

## 2021-09-19 DIAGNOSIS — E1143 Type 2 diabetes mellitus with diabetic autonomic (poly)neuropathy: Secondary | ICD-10-CM | POA: Diagnosis not present

## 2021-09-19 DIAGNOSIS — J441 Chronic obstructive pulmonary disease with (acute) exacerbation: Secondary | ICD-10-CM | POA: Diagnosis not present

## 2021-09-19 DIAGNOSIS — I2510 Atherosclerotic heart disease of native coronary artery without angina pectoris: Secondary | ICD-10-CM | POA: Diagnosis not present

## 2021-09-19 DIAGNOSIS — Z9981 Dependence on supplemental oxygen: Secondary | ICD-10-CM | POA: Diagnosis not present

## 2021-09-19 DIAGNOSIS — Z7982 Long term (current) use of aspirin: Secondary | ICD-10-CM | POA: Diagnosis not present

## 2021-09-19 DIAGNOSIS — K5900 Constipation, unspecified: Secondary | ICD-10-CM | POA: Diagnosis not present

## 2021-09-19 NOTE — Telephone Encounter (Signed)
Spoke to patient by telephone and was advised that he discussed this with Dr. Danise Mina yesterday at his appointment. ?

## 2021-09-20 DIAGNOSIS — Z7951 Long term (current) use of inhaled steroids: Secondary | ICD-10-CM

## 2021-09-20 DIAGNOSIS — Z87891 Personal history of nicotine dependence: Secondary | ICD-10-CM

## 2021-09-20 DIAGNOSIS — E11319 Type 2 diabetes mellitus with unspecified diabetic retinopathy without macular edema: Secondary | ICD-10-CM

## 2021-09-20 DIAGNOSIS — I499 Cardiac arrhythmia, unspecified: Secondary | ICD-10-CM

## 2021-09-20 DIAGNOSIS — E785 Hyperlipidemia, unspecified: Secondary | ICD-10-CM

## 2021-09-20 DIAGNOSIS — Z96642 Presence of left artificial hip joint: Secondary | ICD-10-CM

## 2021-09-20 DIAGNOSIS — I10 Essential (primary) hypertension: Secondary | ICD-10-CM

## 2021-09-20 DIAGNOSIS — M47812 Spondylosis without myelopathy or radiculopathy, cervical region: Secondary | ICD-10-CM

## 2021-09-20 DIAGNOSIS — M545 Low back pain, unspecified: Secondary | ICD-10-CM

## 2021-09-20 DIAGNOSIS — Z9849 Cataract extraction status, unspecified eye: Secondary | ICD-10-CM

## 2021-09-20 DIAGNOSIS — I2510 Atherosclerotic heart disease of native coronary artery without angina pectoris: Secondary | ICD-10-CM

## 2021-09-20 DIAGNOSIS — J441 Chronic obstructive pulmonary disease with (acute) exacerbation: Secondary | ICD-10-CM

## 2021-09-20 DIAGNOSIS — K219 Gastro-esophageal reflux disease without esophagitis: Secondary | ICD-10-CM

## 2021-09-20 DIAGNOSIS — Z9181 History of falling: Secondary | ICD-10-CM

## 2021-09-20 DIAGNOSIS — D519 Vitamin B12 deficiency anemia, unspecified: Secondary | ICD-10-CM

## 2021-09-20 DIAGNOSIS — Z85118 Personal history of other malignant neoplasm of bronchus and lung: Secondary | ICD-10-CM

## 2021-09-20 DIAGNOSIS — Z981 Arthrodesis status: Secondary | ICD-10-CM

## 2021-09-20 DIAGNOSIS — N39498 Other specified urinary incontinence: Secondary | ICD-10-CM

## 2021-09-20 DIAGNOSIS — H3530 Unspecified macular degeneration: Secondary | ICD-10-CM

## 2021-09-20 DIAGNOSIS — N401 Enlarged prostate with lower urinary tract symptoms: Secondary | ICD-10-CM

## 2021-09-20 DIAGNOSIS — K5900 Constipation, unspecified: Secondary | ICD-10-CM

## 2021-09-20 DIAGNOSIS — Z8551 Personal history of malignant neoplasm of bladder: Secondary | ICD-10-CM

## 2021-09-20 DIAGNOSIS — G709 Myoneural disorder, unspecified: Secondary | ICD-10-CM

## 2021-09-20 DIAGNOSIS — S72002D Fracture of unspecified part of neck of left femur, subsequent encounter for closed fracture with routine healing: Secondary | ICD-10-CM

## 2021-09-20 DIAGNOSIS — Z7982 Long term (current) use of aspirin: Secondary | ICD-10-CM

## 2021-09-20 DIAGNOSIS — J9611 Chronic respiratory failure with hypoxia: Secondary | ICD-10-CM

## 2021-09-20 DIAGNOSIS — Z86010 Personal history of colonic polyps: Secondary | ICD-10-CM

## 2021-09-20 DIAGNOSIS — G250 Essential tremor: Secondary | ICD-10-CM

## 2021-09-20 DIAGNOSIS — Z9981 Dependence on supplemental oxygen: Secondary | ICD-10-CM

## 2021-09-20 DIAGNOSIS — G8929 Other chronic pain: Secondary | ICD-10-CM

## 2021-09-20 DIAGNOSIS — E1143 Type 2 diabetes mellitus with diabetic autonomic (poly)neuropathy: Secondary | ICD-10-CM

## 2021-09-20 DIAGNOSIS — Z87442 Personal history of urinary calculi: Secondary | ICD-10-CM

## 2021-09-21 DIAGNOSIS — S72002D Fracture of unspecified part of neck of left femur, subsequent encounter for closed fracture with routine healing: Secondary | ICD-10-CM | POA: Diagnosis not present

## 2021-09-21 DIAGNOSIS — G8929 Other chronic pain: Secondary | ICD-10-CM | POA: Diagnosis not present

## 2021-09-21 DIAGNOSIS — Z7982 Long term (current) use of aspirin: Secondary | ICD-10-CM | POA: Diagnosis not present

## 2021-09-21 DIAGNOSIS — D519 Vitamin B12 deficiency anemia, unspecified: Secondary | ICD-10-CM | POA: Diagnosis not present

## 2021-09-21 DIAGNOSIS — K219 Gastro-esophageal reflux disease without esophagitis: Secondary | ICD-10-CM | POA: Diagnosis not present

## 2021-09-21 DIAGNOSIS — Z7951 Long term (current) use of inhaled steroids: Secondary | ICD-10-CM | POA: Diagnosis not present

## 2021-09-21 DIAGNOSIS — K5900 Constipation, unspecified: Secondary | ICD-10-CM | POA: Diagnosis not present

## 2021-09-21 DIAGNOSIS — Z96642 Presence of left artificial hip joint: Secondary | ICD-10-CM | POA: Diagnosis not present

## 2021-09-21 DIAGNOSIS — E11319 Type 2 diabetes mellitus with unspecified diabetic retinopathy without macular edema: Secondary | ICD-10-CM | POA: Diagnosis not present

## 2021-09-21 DIAGNOSIS — J9611 Chronic respiratory failure with hypoxia: Secondary | ICD-10-CM | POA: Diagnosis not present

## 2021-09-21 DIAGNOSIS — G709 Myoneural disorder, unspecified: Secondary | ICD-10-CM | POA: Diagnosis not present

## 2021-09-21 DIAGNOSIS — G250 Essential tremor: Secondary | ICD-10-CM | POA: Diagnosis not present

## 2021-09-21 DIAGNOSIS — I2510 Atherosclerotic heart disease of native coronary artery without angina pectoris: Secondary | ICD-10-CM | POA: Diagnosis not present

## 2021-09-21 DIAGNOSIS — Z9981 Dependence on supplemental oxygen: Secondary | ICD-10-CM | POA: Diagnosis not present

## 2021-09-21 DIAGNOSIS — M545 Low back pain, unspecified: Secondary | ICD-10-CM | POA: Diagnosis not present

## 2021-09-21 DIAGNOSIS — M47812 Spondylosis without myelopathy or radiculopathy, cervical region: Secondary | ICD-10-CM | POA: Diagnosis not present

## 2021-09-21 DIAGNOSIS — I10 Essential (primary) hypertension: Secondary | ICD-10-CM | POA: Diagnosis not present

## 2021-09-21 DIAGNOSIS — I499 Cardiac arrhythmia, unspecified: Secondary | ICD-10-CM | POA: Diagnosis not present

## 2021-09-21 DIAGNOSIS — E1143 Type 2 diabetes mellitus with diabetic autonomic (poly)neuropathy: Secondary | ICD-10-CM | POA: Diagnosis not present

## 2021-09-21 DIAGNOSIS — H3530 Unspecified macular degeneration: Secondary | ICD-10-CM | POA: Diagnosis not present

## 2021-09-21 DIAGNOSIS — J441 Chronic obstructive pulmonary disease with (acute) exacerbation: Secondary | ICD-10-CM | POA: Diagnosis not present

## 2021-09-21 DIAGNOSIS — E785 Hyperlipidemia, unspecified: Secondary | ICD-10-CM | POA: Diagnosis not present

## 2021-09-25 ENCOUNTER — Other Ambulatory Visit (INDEPENDENT_AMBULATORY_CARE_PROVIDER_SITE_OTHER): Payer: Medicare Other

## 2021-09-25 DIAGNOSIS — G250 Essential tremor: Secondary | ICD-10-CM | POA: Diagnosis not present

## 2021-09-25 DIAGNOSIS — J441 Chronic obstructive pulmonary disease with (acute) exacerbation: Secondary | ICD-10-CM | POA: Diagnosis not present

## 2021-09-25 DIAGNOSIS — D519 Vitamin B12 deficiency anemia, unspecified: Secondary | ICD-10-CM | POA: Diagnosis not present

## 2021-09-25 DIAGNOSIS — M545 Low back pain, unspecified: Secondary | ICD-10-CM | POA: Diagnosis not present

## 2021-09-25 DIAGNOSIS — K5900 Constipation, unspecified: Secondary | ICD-10-CM | POA: Diagnosis not present

## 2021-09-25 DIAGNOSIS — D649 Anemia, unspecified: Secondary | ICD-10-CM

## 2021-09-25 DIAGNOSIS — Z96642 Presence of left artificial hip joint: Secondary | ICD-10-CM | POA: Diagnosis not present

## 2021-09-25 DIAGNOSIS — I10 Essential (primary) hypertension: Secondary | ICD-10-CM | POA: Diagnosis not present

## 2021-09-25 DIAGNOSIS — G709 Myoneural disorder, unspecified: Secondary | ICD-10-CM | POA: Diagnosis not present

## 2021-09-25 DIAGNOSIS — M47812 Spondylosis without myelopathy or radiculopathy, cervical region: Secondary | ICD-10-CM | POA: Diagnosis not present

## 2021-09-25 DIAGNOSIS — I2510 Atherosclerotic heart disease of native coronary artery without angina pectoris: Secondary | ICD-10-CM | POA: Diagnosis not present

## 2021-09-25 DIAGNOSIS — I499 Cardiac arrhythmia, unspecified: Secondary | ICD-10-CM | POA: Diagnosis not present

## 2021-09-25 DIAGNOSIS — S72002D Fracture of unspecified part of neck of left femur, subsequent encounter for closed fracture with routine healing: Secondary | ICD-10-CM | POA: Diagnosis not present

## 2021-09-25 DIAGNOSIS — E11319 Type 2 diabetes mellitus with unspecified diabetic retinopathy without macular edema: Secondary | ICD-10-CM | POA: Diagnosis not present

## 2021-09-25 DIAGNOSIS — J9611 Chronic respiratory failure with hypoxia: Secondary | ICD-10-CM | POA: Diagnosis not present

## 2021-09-25 DIAGNOSIS — Z7951 Long term (current) use of inhaled steroids: Secondary | ICD-10-CM | POA: Diagnosis not present

## 2021-09-25 DIAGNOSIS — H3530 Unspecified macular degeneration: Secondary | ICD-10-CM | POA: Diagnosis not present

## 2021-09-25 DIAGNOSIS — G8929 Other chronic pain: Secondary | ICD-10-CM | POA: Diagnosis not present

## 2021-09-25 DIAGNOSIS — E785 Hyperlipidemia, unspecified: Secondary | ICD-10-CM | POA: Diagnosis not present

## 2021-09-25 DIAGNOSIS — E1143 Type 2 diabetes mellitus with diabetic autonomic (poly)neuropathy: Secondary | ICD-10-CM | POA: Diagnosis not present

## 2021-09-25 DIAGNOSIS — K219 Gastro-esophageal reflux disease without esophagitis: Secondary | ICD-10-CM | POA: Diagnosis not present

## 2021-09-25 DIAGNOSIS — Z7982 Long term (current) use of aspirin: Secondary | ICD-10-CM | POA: Diagnosis not present

## 2021-09-25 DIAGNOSIS — Z9981 Dependence on supplemental oxygen: Secondary | ICD-10-CM | POA: Diagnosis not present

## 2021-09-25 LAB — URINALYSIS, ROUTINE W REFLEX MICROSCOPIC
Bilirubin Urine: NEGATIVE
Hgb urine dipstick: NEGATIVE
Ketones, ur: NEGATIVE
Leukocytes,Ua: NEGATIVE
Nitrite: NEGATIVE
Specific Gravity, Urine: 1.015 (ref 1.000–1.030)
Total Protein, Urine: NEGATIVE
Urine Glucose: NEGATIVE
Urobilinogen, UA: 0.2 (ref 0.0–1.0)
pH: 7 (ref 5.0–8.0)

## 2021-09-26 ENCOUNTER — Encounter: Payer: Self-pay | Admitting: Family Medicine

## 2021-09-26 ENCOUNTER — Telehealth: Payer: Self-pay | Admitting: Radiology

## 2021-09-26 DIAGNOSIS — I499 Cardiac arrhythmia, unspecified: Secondary | ICD-10-CM | POA: Diagnosis not present

## 2021-09-26 DIAGNOSIS — E11319 Type 2 diabetes mellitus with unspecified diabetic retinopathy without macular edema: Secondary | ICD-10-CM | POA: Diagnosis not present

## 2021-09-26 DIAGNOSIS — Z8781 Personal history of (healed) traumatic fracture: Secondary | ICD-10-CM | POA: Diagnosis not present

## 2021-09-26 DIAGNOSIS — K5900 Constipation, unspecified: Secondary | ICD-10-CM | POA: Diagnosis not present

## 2021-09-26 DIAGNOSIS — M47812 Spondylosis without myelopathy or radiculopathy, cervical region: Secondary | ICD-10-CM | POA: Diagnosis not present

## 2021-09-26 DIAGNOSIS — S72002D Fracture of unspecified part of neck of left femur, subsequent encounter for closed fracture with routine healing: Secondary | ICD-10-CM | POA: Diagnosis not present

## 2021-09-26 DIAGNOSIS — E785 Hyperlipidemia, unspecified: Secondary | ICD-10-CM | POA: Diagnosis not present

## 2021-09-26 DIAGNOSIS — Z7951 Long term (current) use of inhaled steroids: Secondary | ICD-10-CM | POA: Diagnosis not present

## 2021-09-26 DIAGNOSIS — D649 Anemia, unspecified: Secondary | ICD-10-CM

## 2021-09-26 DIAGNOSIS — H3530 Unspecified macular degeneration: Secondary | ICD-10-CM | POA: Diagnosis not present

## 2021-09-26 DIAGNOSIS — I10 Essential (primary) hypertension: Secondary | ICD-10-CM | POA: Diagnosis not present

## 2021-09-26 DIAGNOSIS — J441 Chronic obstructive pulmonary disease with (acute) exacerbation: Secondary | ICD-10-CM | POA: Diagnosis not present

## 2021-09-26 DIAGNOSIS — M545 Low back pain, unspecified: Secondary | ICD-10-CM | POA: Diagnosis not present

## 2021-09-26 DIAGNOSIS — G709 Myoneural disorder, unspecified: Secondary | ICD-10-CM | POA: Diagnosis not present

## 2021-09-26 DIAGNOSIS — Z96642 Presence of left artificial hip joint: Secondary | ICD-10-CM | POA: Diagnosis not present

## 2021-09-26 DIAGNOSIS — Z9981 Dependence on supplemental oxygen: Secondary | ICD-10-CM | POA: Diagnosis not present

## 2021-09-26 DIAGNOSIS — E1143 Type 2 diabetes mellitus with diabetic autonomic (poly)neuropathy: Secondary | ICD-10-CM | POA: Diagnosis not present

## 2021-09-26 DIAGNOSIS — G250 Essential tremor: Secondary | ICD-10-CM | POA: Diagnosis not present

## 2021-09-26 DIAGNOSIS — J9611 Chronic respiratory failure with hypoxia: Secondary | ICD-10-CM | POA: Diagnosis not present

## 2021-09-26 DIAGNOSIS — R195 Other fecal abnormalities: Secondary | ICD-10-CM | POA: Insufficient documentation

## 2021-09-26 DIAGNOSIS — K219 Gastro-esophageal reflux disease without esophagitis: Secondary | ICD-10-CM | POA: Diagnosis not present

## 2021-09-26 DIAGNOSIS — I2510 Atherosclerotic heart disease of native coronary artery without angina pectoris: Secondary | ICD-10-CM | POA: Diagnosis not present

## 2021-09-26 DIAGNOSIS — Z7982 Long term (current) use of aspirin: Secondary | ICD-10-CM | POA: Diagnosis not present

## 2021-09-26 DIAGNOSIS — G8929 Other chronic pain: Secondary | ICD-10-CM | POA: Diagnosis not present

## 2021-09-26 DIAGNOSIS — D519 Vitamin B12 deficiency anemia, unspecified: Secondary | ICD-10-CM | POA: Diagnosis not present

## 2021-09-26 LAB — FECAL OCCULT BLOOD, IMMUNOCHEMICAL: Fecal Occult Bld: POSITIVE — AB

## 2021-09-26 NOTE — Telephone Encounter (Signed)
[facility] called a POSITIVE ifob, results given to Dr Danise Mina ?

## 2021-09-26 NOTE — Telephone Encounter (Signed)
GI referral placed

## 2021-09-26 NOTE — Telephone Encounter (Signed)
UA returned normal.  ?Plz notify patient stool test returned positive for blood. With this along with worsening anemia would offer GI referral for further evaluation, see if he would be a candidate for a virtual colonoscopy.  ?Likely not candidate for actual colonoscopy given respiratory status (and previous hypoxia when last colonoscopy attempted).  ?Let me know if he agrees to see GI [name]) again.  ?

## 2021-09-26 NOTE — Telephone Encounter (Addendum)
Tried to call patient and unable to leave a message because mailbox is full. ?Tried to call patient's wife cell number and received a message that number is not in service ?Will have to try and call patient again later. ?

## 2021-09-26 NOTE — Addendum Note (Signed)
Addended by: [name] on: 09/26/2021 05:09 PM ? ? Modules accepted: Orders ? ?

## 2021-09-26 NOTE — Telephone Encounter (Signed)
Dr. Bosie Clos message relayed to the patient. Patient stated that he had seen the results and has already sent a mychart message back to Dr. Danise Mina. Mychart message forwarded to PCP. ?

## 2021-09-28 DIAGNOSIS — Z9981 Dependence on supplemental oxygen: Secondary | ICD-10-CM | POA: Diagnosis not present

## 2021-09-28 DIAGNOSIS — E785 Hyperlipidemia, unspecified: Secondary | ICD-10-CM | POA: Diagnosis not present

## 2021-09-28 DIAGNOSIS — M47812 Spondylosis without myelopathy or radiculopathy, cervical region: Secondary | ICD-10-CM | POA: Diagnosis not present

## 2021-09-28 DIAGNOSIS — K219 Gastro-esophageal reflux disease without esophagitis: Secondary | ICD-10-CM | POA: Diagnosis not present

## 2021-09-28 DIAGNOSIS — G250 Essential tremor: Secondary | ICD-10-CM | POA: Diagnosis not present

## 2021-09-28 DIAGNOSIS — G8929 Other chronic pain: Secondary | ICD-10-CM | POA: Diagnosis not present

## 2021-09-28 DIAGNOSIS — D519 Vitamin B12 deficiency anemia, unspecified: Secondary | ICD-10-CM | POA: Diagnosis not present

## 2021-09-28 DIAGNOSIS — J441 Chronic obstructive pulmonary disease with (acute) exacerbation: Secondary | ICD-10-CM | POA: Diagnosis not present

## 2021-09-28 DIAGNOSIS — I2510 Atherosclerotic heart disease of native coronary artery without angina pectoris: Secondary | ICD-10-CM | POA: Diagnosis not present

## 2021-09-28 DIAGNOSIS — I10 Essential (primary) hypertension: Secondary | ICD-10-CM | POA: Diagnosis not present

## 2021-09-28 DIAGNOSIS — H3530 Unspecified macular degeneration: Secondary | ICD-10-CM | POA: Diagnosis not present

## 2021-09-28 DIAGNOSIS — E1143 Type 2 diabetes mellitus with diabetic autonomic (poly)neuropathy: Secondary | ICD-10-CM | POA: Diagnosis not present

## 2021-09-28 DIAGNOSIS — E11319 Type 2 diabetes mellitus with unspecified diabetic retinopathy without macular edema: Secondary | ICD-10-CM | POA: Diagnosis not present

## 2021-09-28 DIAGNOSIS — K5900 Constipation, unspecified: Secondary | ICD-10-CM | POA: Diagnosis not present

## 2021-09-28 DIAGNOSIS — J9611 Chronic respiratory failure with hypoxia: Secondary | ICD-10-CM | POA: Diagnosis not present

## 2021-09-28 DIAGNOSIS — Z7982 Long term (current) use of aspirin: Secondary | ICD-10-CM | POA: Diagnosis not present

## 2021-09-28 DIAGNOSIS — Z7951 Long term (current) use of inhaled steroids: Secondary | ICD-10-CM | POA: Diagnosis not present

## 2021-09-28 DIAGNOSIS — I499 Cardiac arrhythmia, unspecified: Secondary | ICD-10-CM | POA: Diagnosis not present

## 2021-09-28 DIAGNOSIS — G709 Myoneural disorder, unspecified: Secondary | ICD-10-CM | POA: Diagnosis not present

## 2021-09-28 DIAGNOSIS — M545 Low back pain, unspecified: Secondary | ICD-10-CM | POA: Diagnosis not present

## 2021-09-28 DIAGNOSIS — Z96642 Presence of left artificial hip joint: Secondary | ICD-10-CM | POA: Diagnosis not present

## 2021-09-28 DIAGNOSIS — S72002D Fracture of unspecified part of neck of left femur, subsequent encounter for closed fracture with routine healing: Secondary | ICD-10-CM | POA: Diagnosis not present

## 2021-10-01 DIAGNOSIS — R0600 Dyspnea, unspecified: Secondary | ICD-10-CM | POA: Diagnosis not present

## 2021-10-01 DIAGNOSIS — R0602 Shortness of breath: Secondary | ICD-10-CM | POA: Diagnosis not present

## 2021-10-01 DIAGNOSIS — J9611 Chronic respiratory failure with hypoxia: Secondary | ICD-10-CM | POA: Diagnosis not present

## 2021-10-01 DIAGNOSIS — J95811 Postprocedural pneumothorax: Secondary | ICD-10-CM | POA: Diagnosis not present

## 2021-10-03 DIAGNOSIS — G8929 Other chronic pain: Secondary | ICD-10-CM | POA: Diagnosis not present

## 2021-10-03 DIAGNOSIS — Z96642 Presence of left artificial hip joint: Secondary | ICD-10-CM | POA: Diagnosis not present

## 2021-10-03 DIAGNOSIS — M47812 Spondylosis without myelopathy or radiculopathy, cervical region: Secondary | ICD-10-CM | POA: Diagnosis not present

## 2021-10-03 DIAGNOSIS — Z7982 Long term (current) use of aspirin: Secondary | ICD-10-CM | POA: Diagnosis not present

## 2021-10-03 DIAGNOSIS — E785 Hyperlipidemia, unspecified: Secondary | ICD-10-CM | POA: Diagnosis not present

## 2021-10-03 DIAGNOSIS — I2510 Atherosclerotic heart disease of native coronary artery without angina pectoris: Secondary | ICD-10-CM | POA: Diagnosis not present

## 2021-10-03 DIAGNOSIS — S72002D Fracture of unspecified part of neck of left femur, subsequent encounter for closed fracture with routine healing: Secondary | ICD-10-CM | POA: Diagnosis not present

## 2021-10-03 DIAGNOSIS — D519 Vitamin B12 deficiency anemia, unspecified: Secondary | ICD-10-CM | POA: Diagnosis not present

## 2021-10-03 DIAGNOSIS — Z9981 Dependence on supplemental oxygen: Secondary | ICD-10-CM | POA: Diagnosis not present

## 2021-10-03 DIAGNOSIS — I10 Essential (primary) hypertension: Secondary | ICD-10-CM | POA: Diagnosis not present

## 2021-10-03 DIAGNOSIS — I499 Cardiac arrhythmia, unspecified: Secondary | ICD-10-CM | POA: Diagnosis not present

## 2021-10-03 DIAGNOSIS — Z7951 Long term (current) use of inhaled steroids: Secondary | ICD-10-CM | POA: Diagnosis not present

## 2021-10-03 DIAGNOSIS — E1143 Type 2 diabetes mellitus with diabetic autonomic (poly)neuropathy: Secondary | ICD-10-CM | POA: Diagnosis not present

## 2021-10-03 DIAGNOSIS — K5900 Constipation, unspecified: Secondary | ICD-10-CM | POA: Diagnosis not present

## 2021-10-03 DIAGNOSIS — H3530 Unspecified macular degeneration: Secondary | ICD-10-CM | POA: Diagnosis not present

## 2021-10-03 DIAGNOSIS — J441 Chronic obstructive pulmonary disease with (acute) exacerbation: Secondary | ICD-10-CM | POA: Diagnosis not present

## 2021-10-03 DIAGNOSIS — J9611 Chronic respiratory failure with hypoxia: Secondary | ICD-10-CM | POA: Diagnosis not present

## 2021-10-03 DIAGNOSIS — M545 Low back pain, unspecified: Secondary | ICD-10-CM | POA: Diagnosis not present

## 2021-10-03 DIAGNOSIS — E11319 Type 2 diabetes mellitus with unspecified diabetic retinopathy without macular edema: Secondary | ICD-10-CM | POA: Diagnosis not present

## 2021-10-03 DIAGNOSIS — G250 Essential tremor: Secondary | ICD-10-CM | POA: Diagnosis not present

## 2021-10-03 DIAGNOSIS — K219 Gastro-esophageal reflux disease without esophagitis: Secondary | ICD-10-CM | POA: Diagnosis not present

## 2021-10-03 DIAGNOSIS — G709 Myoneural disorder, unspecified: Secondary | ICD-10-CM | POA: Diagnosis not present

## 2021-10-05 ENCOUNTER — Telehealth: Payer: Self-pay

## 2021-10-05 ENCOUNTER — Encounter: Payer: Self-pay | Admitting: Family Medicine

## 2021-10-05 DIAGNOSIS — I499 Cardiac arrhythmia, unspecified: Secondary | ICD-10-CM | POA: Diagnosis not present

## 2021-10-05 DIAGNOSIS — Z7982 Long term (current) use of aspirin: Secondary | ICD-10-CM | POA: Diagnosis not present

## 2021-10-05 DIAGNOSIS — M47812 Spondylosis without myelopathy or radiculopathy, cervical region: Secondary | ICD-10-CM | POA: Diagnosis not present

## 2021-10-05 DIAGNOSIS — G250 Essential tremor: Secondary | ICD-10-CM | POA: Diagnosis not present

## 2021-10-05 DIAGNOSIS — H3530 Unspecified macular degeneration: Secondary | ICD-10-CM | POA: Diagnosis not present

## 2021-10-05 DIAGNOSIS — D519 Vitamin B12 deficiency anemia, unspecified: Secondary | ICD-10-CM | POA: Diagnosis not present

## 2021-10-05 DIAGNOSIS — K5900 Constipation, unspecified: Secondary | ICD-10-CM | POA: Diagnosis not present

## 2021-10-05 DIAGNOSIS — E11319 Type 2 diabetes mellitus with unspecified diabetic retinopathy without macular edema: Secondary | ICD-10-CM | POA: Diagnosis not present

## 2021-10-05 DIAGNOSIS — G709 Myoneural disorder, unspecified: Secondary | ICD-10-CM | POA: Diagnosis not present

## 2021-10-05 DIAGNOSIS — S72002D Fracture of unspecified part of neck of left femur, subsequent encounter for closed fracture with routine healing: Secondary | ICD-10-CM | POA: Diagnosis not present

## 2021-10-05 DIAGNOSIS — J9611 Chronic respiratory failure with hypoxia: Secondary | ICD-10-CM | POA: Diagnosis not present

## 2021-10-05 DIAGNOSIS — Z7951 Long term (current) use of inhaled steroids: Secondary | ICD-10-CM | POA: Diagnosis not present

## 2021-10-05 DIAGNOSIS — I2510 Atherosclerotic heart disease of native coronary artery without angina pectoris: Secondary | ICD-10-CM | POA: Diagnosis not present

## 2021-10-05 DIAGNOSIS — E785 Hyperlipidemia, unspecified: Secondary | ICD-10-CM | POA: Diagnosis not present

## 2021-10-05 DIAGNOSIS — Z9981 Dependence on supplemental oxygen: Secondary | ICD-10-CM | POA: Diagnosis not present

## 2021-10-05 DIAGNOSIS — E1143 Type 2 diabetes mellitus with diabetic autonomic (poly)neuropathy: Secondary | ICD-10-CM | POA: Diagnosis not present

## 2021-10-05 DIAGNOSIS — I10 Essential (primary) hypertension: Secondary | ICD-10-CM | POA: Diagnosis not present

## 2021-10-05 DIAGNOSIS — G8929 Other chronic pain: Secondary | ICD-10-CM | POA: Diagnosis not present

## 2021-10-05 DIAGNOSIS — J441 Chronic obstructive pulmonary disease with (acute) exacerbation: Secondary | ICD-10-CM | POA: Diagnosis not present

## 2021-10-05 DIAGNOSIS — Z96642 Presence of left artificial hip joint: Secondary | ICD-10-CM | POA: Diagnosis not present

## 2021-10-05 DIAGNOSIS — M545 Low back pain, unspecified: Secondary | ICD-10-CM | POA: Diagnosis not present

## 2021-10-05 DIAGNOSIS — K219 Gastro-esophageal reflux disease without esophagitis: Secondary | ICD-10-CM | POA: Diagnosis not present

## 2021-10-05 NOTE — Telephone Encounter (Signed)
Patient called office states he was in rehab and prescription got changed up. Will need new one sent in for him to be able to get more. Needs sent to [facility].  ?

## 2021-10-05 NOTE — Telephone Encounter (Signed)
See 10/05/21 phn note. ?

## 2021-10-05 NOTE — Telephone Encounter (Signed)
No action needed.  (See 10/05/21 Pt Msg) ?

## 2021-10-09 ENCOUNTER — Inpatient Hospital Stay: Payer: Medicare Other | Attending: Hospice and Palliative Medicine | Admitting: Hospice and Palliative Medicine

## 2021-10-09 DIAGNOSIS — C3411 Malignant neoplasm of upper lobe, right bronchus or lung: Secondary | ICD-10-CM

## 2021-10-09 DIAGNOSIS — G894 Chronic pain syndrome: Secondary | ICD-10-CM

## 2021-10-09 DIAGNOSIS — Z515 Encounter for palliative care: Secondary | ICD-10-CM | POA: Diagnosis not present

## 2021-10-09 MED ORDER — HYDROCODONE-ACETAMINOPHEN 7.5-325 MG PO TABS: 1.0000 | ORAL_TABLET | ORAL | 0 refills | Status: DC | PRN

## 2021-10-09 NOTE — Progress Notes (Signed)
Virtual Visit via Telephone Note ? ?I connected with Joseph Graham on 10/09/21 at  2:00 PM EDT by telephone and verified that I am speaking with the correct person using two identifiers. ? ?Location: ?Patient: home ?Provider: clinic ?  ?I discussed the limitations, risks, security and privacy concerns of performing an evaluation and management service by telephone and the availability of in person appointments. I also discussed with the patient that there may be a patient responsible charge related to this service. The patient expressed understanding and agreed to proceed. ? ? ?History of Present Illness: ?[name] is a 81 year old man with multiple medical problems including O2 dependent COPD, CAD, PAD, peripheral neuropathy, and lumbar stenosis with chronic back pain.  Additionally, patient has stage II lung cancer with CT of the chest on 02/24/2020 revealing interval growth and pulmonary nodules.    Patient is status post XRT.  He has had severe chronic back pain limiting treatment and imaging.  He was referred to palliative care to address goals and manage ongoing symptoms. ?  ?Observations/Objective: ?I spoke with patient by phone.  Patient was hospitalized 3/10-3/14/23 with L. Hip fracture after a mechanical fall at home. He required left. Hip hemiarthroplasty (on 3/11). Patient was subsequently sent to rehab and then received about a month of home health PT. Today, patient reports that he is slowly improving, although he is not quite back to his previous functional baseline. He does report stable symptoms/pain. No significant changes or concerns recently. No issues with pain meds. He does request refill today of Norco. ? ? ?Assessment and Plan: ?Lung cancer -s/p XRT.  Now on surveillance.  Follow up CT on 01/01/22. Patient will see Dr. Rogue Bussing on 01/08/22 ? ?Chronic low back pain -refill Norco 7.5-325mg  Q4H PRN #90 and OTC lidocaine patches as needed. PDMP reviewed. ? ? ?Follow Up  Instructions: ?Follow-up MyChart visit with me in 2-3 months ?  ?I discussed the assessment and treatment plan with the patient. The patient was provided an opportunity to ask questions and all were answered. The patient agreed with the plan and demonstrated an understanding of the instructions. ?  ?The patient was advised to call back or seek an in-person evaluation if the symptoms worsen or if the condition fails to improve as anticipated. ? ?I provided 10 minutes of non-face-to-face time during this encounter. ? ? ?Irean Hong, NP ? ? ?

## 2021-10-10 DIAGNOSIS — M47812 Spondylosis without myelopathy or radiculopathy, cervical region: Secondary | ICD-10-CM | POA: Diagnosis not present

## 2021-10-10 DIAGNOSIS — E11319 Type 2 diabetes mellitus with unspecified diabetic retinopathy without macular edema: Secondary | ICD-10-CM | POA: Diagnosis not present

## 2021-10-10 DIAGNOSIS — Z7951 Long term (current) use of inhaled steroids: Secondary | ICD-10-CM | POA: Diagnosis not present

## 2021-10-10 DIAGNOSIS — I10 Essential (primary) hypertension: Secondary | ICD-10-CM | POA: Diagnosis not present

## 2021-10-10 DIAGNOSIS — K5900 Constipation, unspecified: Secondary | ICD-10-CM | POA: Diagnosis not present

## 2021-10-10 DIAGNOSIS — I499 Cardiac arrhythmia, unspecified: Secondary | ICD-10-CM | POA: Diagnosis not present

## 2021-10-10 DIAGNOSIS — G250 Essential tremor: Secondary | ICD-10-CM | POA: Diagnosis not present

## 2021-10-10 DIAGNOSIS — D519 Vitamin B12 deficiency anemia, unspecified: Secondary | ICD-10-CM | POA: Diagnosis not present

## 2021-10-10 DIAGNOSIS — H3530 Unspecified macular degeneration: Secondary | ICD-10-CM | POA: Diagnosis not present

## 2021-10-10 DIAGNOSIS — J9611 Chronic respiratory failure with hypoxia: Secondary | ICD-10-CM | POA: Diagnosis not present

## 2021-10-10 DIAGNOSIS — M545 Low back pain, unspecified: Secondary | ICD-10-CM | POA: Diagnosis not present

## 2021-10-10 DIAGNOSIS — E785 Hyperlipidemia, unspecified: Secondary | ICD-10-CM | POA: Diagnosis not present

## 2021-10-10 DIAGNOSIS — I2510 Atherosclerotic heart disease of native coronary artery without angina pectoris: Secondary | ICD-10-CM | POA: Diagnosis not present

## 2021-10-10 DIAGNOSIS — G709 Myoneural disorder, unspecified: Secondary | ICD-10-CM | POA: Diagnosis not present

## 2021-10-10 DIAGNOSIS — G8929 Other chronic pain: Secondary | ICD-10-CM | POA: Diagnosis not present

## 2021-10-10 DIAGNOSIS — S72002D Fracture of unspecified part of neck of left femur, subsequent encounter for closed fracture with routine healing: Secondary | ICD-10-CM | POA: Diagnosis not present

## 2021-10-10 DIAGNOSIS — J441 Chronic obstructive pulmonary disease with (acute) exacerbation: Secondary | ICD-10-CM | POA: Diagnosis not present

## 2021-10-10 DIAGNOSIS — Z96642 Presence of left artificial hip joint: Secondary | ICD-10-CM | POA: Diagnosis not present

## 2021-10-10 DIAGNOSIS — Z7982 Long term (current) use of aspirin: Secondary | ICD-10-CM | POA: Diagnosis not present

## 2021-10-10 DIAGNOSIS — Z9981 Dependence on supplemental oxygen: Secondary | ICD-10-CM | POA: Diagnosis not present

## 2021-10-10 DIAGNOSIS — K219 Gastro-esophageal reflux disease without esophagitis: Secondary | ICD-10-CM | POA: Diagnosis not present

## 2021-10-10 DIAGNOSIS — E1143 Type 2 diabetes mellitus with diabetic autonomic (poly)neuropathy: Secondary | ICD-10-CM | POA: Diagnosis not present

## 2021-10-17 DIAGNOSIS — Z96642 Presence of left artificial hip joint: Secondary | ICD-10-CM | POA: Diagnosis not present

## 2021-10-17 DIAGNOSIS — M47812 Spondylosis without myelopathy or radiculopathy, cervical region: Secondary | ICD-10-CM | POA: Diagnosis not present

## 2021-10-17 DIAGNOSIS — E785 Hyperlipidemia, unspecified: Secondary | ICD-10-CM | POA: Diagnosis not present

## 2021-10-17 DIAGNOSIS — Z7951 Long term (current) use of inhaled steroids: Secondary | ICD-10-CM | POA: Diagnosis not present

## 2021-10-17 DIAGNOSIS — I2510 Atherosclerotic heart disease of native coronary artery without angina pectoris: Secondary | ICD-10-CM | POA: Diagnosis not present

## 2021-10-17 DIAGNOSIS — E1143 Type 2 diabetes mellitus with diabetic autonomic (poly)neuropathy: Secondary | ICD-10-CM | POA: Diagnosis not present

## 2021-10-17 DIAGNOSIS — K219 Gastro-esophageal reflux disease without esophagitis: Secondary | ICD-10-CM | POA: Diagnosis not present

## 2021-10-17 DIAGNOSIS — Z9981 Dependence on supplemental oxygen: Secondary | ICD-10-CM | POA: Diagnosis not present

## 2021-10-17 DIAGNOSIS — H3530 Unspecified macular degeneration: Secondary | ICD-10-CM | POA: Diagnosis not present

## 2021-10-17 DIAGNOSIS — J9611 Chronic respiratory failure with hypoxia: Secondary | ICD-10-CM | POA: Diagnosis not present

## 2021-10-17 DIAGNOSIS — I499 Cardiac arrhythmia, unspecified: Secondary | ICD-10-CM | POA: Diagnosis not present

## 2021-10-17 DIAGNOSIS — J441 Chronic obstructive pulmonary disease with (acute) exacerbation: Secondary | ICD-10-CM | POA: Diagnosis not present

## 2021-10-17 DIAGNOSIS — G709 Myoneural disorder, unspecified: Secondary | ICD-10-CM | POA: Diagnosis not present

## 2021-10-17 DIAGNOSIS — K5900 Constipation, unspecified: Secondary | ICD-10-CM | POA: Diagnosis not present

## 2021-10-17 DIAGNOSIS — D519 Vitamin B12 deficiency anemia, unspecified: Secondary | ICD-10-CM | POA: Diagnosis not present

## 2021-10-17 DIAGNOSIS — M545 Low back pain, unspecified: Secondary | ICD-10-CM | POA: Diagnosis not present

## 2021-10-17 DIAGNOSIS — G8929 Other chronic pain: Secondary | ICD-10-CM | POA: Diagnosis not present

## 2021-10-17 DIAGNOSIS — E11319 Type 2 diabetes mellitus with unspecified diabetic retinopathy without macular edema: Secondary | ICD-10-CM | POA: Diagnosis not present

## 2021-10-17 DIAGNOSIS — I10 Essential (primary) hypertension: Secondary | ICD-10-CM | POA: Diagnosis not present

## 2021-10-17 DIAGNOSIS — Z7982 Long term (current) use of aspirin: Secondary | ICD-10-CM | POA: Diagnosis not present

## 2021-10-17 DIAGNOSIS — G250 Essential tremor: Secondary | ICD-10-CM | POA: Diagnosis not present

## 2021-10-17 DIAGNOSIS — S72002D Fracture of unspecified part of neck of left femur, subsequent encounter for closed fracture with routine healing: Secondary | ICD-10-CM | POA: Diagnosis not present

## 2021-10-20 ENCOUNTER — Telehealth: Payer: Medicare Other

## 2021-10-24 DIAGNOSIS — G8929 Other chronic pain: Secondary | ICD-10-CM | POA: Diagnosis not present

## 2021-10-24 DIAGNOSIS — H3530 Unspecified macular degeneration: Secondary | ICD-10-CM | POA: Diagnosis not present

## 2021-10-24 DIAGNOSIS — K5900 Constipation, unspecified: Secondary | ICD-10-CM | POA: Diagnosis not present

## 2021-10-24 DIAGNOSIS — I10 Essential (primary) hypertension: Secondary | ICD-10-CM | POA: Diagnosis not present

## 2021-10-24 DIAGNOSIS — K219 Gastro-esophageal reflux disease without esophagitis: Secondary | ICD-10-CM | POA: Diagnosis not present

## 2021-10-24 DIAGNOSIS — M545 Low back pain, unspecified: Secondary | ICD-10-CM | POA: Diagnosis not present

## 2021-10-24 DIAGNOSIS — I499 Cardiac arrhythmia, unspecified: Secondary | ICD-10-CM | POA: Diagnosis not present

## 2021-10-24 DIAGNOSIS — E11319 Type 2 diabetes mellitus with unspecified diabetic retinopathy without macular edema: Secondary | ICD-10-CM | POA: Diagnosis not present

## 2021-10-24 DIAGNOSIS — E785 Hyperlipidemia, unspecified: Secondary | ICD-10-CM | POA: Diagnosis not present

## 2021-10-24 DIAGNOSIS — Z9981 Dependence on supplemental oxygen: Secondary | ICD-10-CM | POA: Diagnosis not present

## 2021-10-24 DIAGNOSIS — E1143 Type 2 diabetes mellitus with diabetic autonomic (poly)neuropathy: Secondary | ICD-10-CM | POA: Diagnosis not present

## 2021-10-24 DIAGNOSIS — Z96642 Presence of left artificial hip joint: Secondary | ICD-10-CM | POA: Diagnosis not present

## 2021-10-24 DIAGNOSIS — J9611 Chronic respiratory failure with hypoxia: Secondary | ICD-10-CM | POA: Diagnosis not present

## 2021-10-24 DIAGNOSIS — Z7951 Long term (current) use of inhaled steroids: Secondary | ICD-10-CM | POA: Diagnosis not present

## 2021-10-24 DIAGNOSIS — G709 Myoneural disorder, unspecified: Secondary | ICD-10-CM | POA: Diagnosis not present

## 2021-10-24 DIAGNOSIS — S72002D Fracture of unspecified part of neck of left femur, subsequent encounter for closed fracture with routine healing: Secondary | ICD-10-CM | POA: Diagnosis not present

## 2021-10-24 DIAGNOSIS — J441 Chronic obstructive pulmonary disease with (acute) exacerbation: Secondary | ICD-10-CM | POA: Diagnosis not present

## 2021-10-24 DIAGNOSIS — I2510 Atherosclerotic heart disease of native coronary artery without angina pectoris: Secondary | ICD-10-CM | POA: Diagnosis not present

## 2021-10-24 DIAGNOSIS — G250 Essential tremor: Secondary | ICD-10-CM | POA: Diagnosis not present

## 2021-10-24 DIAGNOSIS — Z7982 Long term (current) use of aspirin: Secondary | ICD-10-CM | POA: Diagnosis not present

## 2021-10-24 DIAGNOSIS — D519 Vitamin B12 deficiency anemia, unspecified: Secondary | ICD-10-CM | POA: Diagnosis not present

## 2021-10-24 DIAGNOSIS — M47812 Spondylosis without myelopathy or radiculopathy, cervical region: Secondary | ICD-10-CM | POA: Diagnosis not present

## 2021-10-30 ENCOUNTER — Telehealth: Payer: Self-pay | Admitting: Family Medicine

## 2021-10-30 NOTE — Telephone Encounter (Signed)
Spoke to patient to schedule AWV  Patient declined stating he does not want to do

## 2021-10-31 DIAGNOSIS — G250 Essential tremor: Secondary | ICD-10-CM | POA: Diagnosis not present

## 2021-10-31 DIAGNOSIS — E11319 Type 2 diabetes mellitus with unspecified diabetic retinopathy without macular edema: Secondary | ICD-10-CM | POA: Diagnosis not present

## 2021-10-31 DIAGNOSIS — R0600 Dyspnea, unspecified: Secondary | ICD-10-CM | POA: Diagnosis not present

## 2021-10-31 DIAGNOSIS — G709 Myoneural disorder, unspecified: Secondary | ICD-10-CM | POA: Diagnosis not present

## 2021-10-31 DIAGNOSIS — E785 Hyperlipidemia, unspecified: Secondary | ICD-10-CM | POA: Diagnosis not present

## 2021-10-31 DIAGNOSIS — D519 Vitamin B12 deficiency anemia, unspecified: Secondary | ICD-10-CM | POA: Diagnosis not present

## 2021-10-31 DIAGNOSIS — J95811 Postprocedural pneumothorax: Secondary | ICD-10-CM | POA: Diagnosis not present

## 2021-10-31 DIAGNOSIS — M545 Low back pain, unspecified: Secondary | ICD-10-CM | POA: Diagnosis not present

## 2021-10-31 DIAGNOSIS — G8929 Other chronic pain: Secondary | ICD-10-CM | POA: Diagnosis not present

## 2021-10-31 DIAGNOSIS — J9611 Chronic respiratory failure with hypoxia: Secondary | ICD-10-CM | POA: Diagnosis not present

## 2021-10-31 DIAGNOSIS — Z7951 Long term (current) use of inhaled steroids: Secondary | ICD-10-CM | POA: Diagnosis not present

## 2021-10-31 DIAGNOSIS — K219 Gastro-esophageal reflux disease without esophagitis: Secondary | ICD-10-CM | POA: Diagnosis not present

## 2021-10-31 DIAGNOSIS — R0602 Shortness of breath: Secondary | ICD-10-CM | POA: Diagnosis not present

## 2021-10-31 DIAGNOSIS — Z96642 Presence of left artificial hip joint: Secondary | ICD-10-CM | POA: Diagnosis not present

## 2021-10-31 DIAGNOSIS — K5900 Constipation, unspecified: Secondary | ICD-10-CM | POA: Diagnosis not present

## 2021-10-31 DIAGNOSIS — H3530 Unspecified macular degeneration: Secondary | ICD-10-CM | POA: Diagnosis not present

## 2021-10-31 DIAGNOSIS — Z7982 Long term (current) use of aspirin: Secondary | ICD-10-CM | POA: Diagnosis not present

## 2021-10-31 DIAGNOSIS — M47812 Spondylosis without myelopathy or radiculopathy, cervical region: Secondary | ICD-10-CM | POA: Diagnosis not present

## 2021-10-31 DIAGNOSIS — I2510 Atherosclerotic heart disease of native coronary artery without angina pectoris: Secondary | ICD-10-CM | POA: Diagnosis not present

## 2021-10-31 DIAGNOSIS — Z9981 Dependence on supplemental oxygen: Secondary | ICD-10-CM | POA: Diagnosis not present

## 2021-10-31 DIAGNOSIS — E1143 Type 2 diabetes mellitus with diabetic autonomic (poly)neuropathy: Secondary | ICD-10-CM | POA: Diagnosis not present

## 2021-10-31 DIAGNOSIS — J441 Chronic obstructive pulmonary disease with (acute) exacerbation: Secondary | ICD-10-CM | POA: Diagnosis not present

## 2021-10-31 DIAGNOSIS — I10 Essential (primary) hypertension: Secondary | ICD-10-CM | POA: Diagnosis not present

## 2021-10-31 DIAGNOSIS — I499 Cardiac arrhythmia, unspecified: Secondary | ICD-10-CM | POA: Diagnosis not present

## 2021-10-31 DIAGNOSIS — S72002D Fracture of unspecified part of neck of left femur, subsequent encounter for closed fracture with routine healing: Secondary | ICD-10-CM | POA: Diagnosis not present

## 2021-11-01 ENCOUNTER — Other Ambulatory Visit: Payer: Self-pay | Admitting: *Deleted

## 2021-11-01 MED ORDER — HYDROCODONE-ACETAMINOPHEN 7.5-325 MG PO TABS: 1.0000 | ORAL_TABLET | ORAL | 0 refills | Status: DC | PRN

## 2021-11-27 ENCOUNTER — Other Ambulatory Visit: Payer: Self-pay | Admitting: Medical Oncology

## 2021-11-27 ENCOUNTER — Other Ambulatory Visit: Payer: Self-pay | Admitting: *Deleted

## 2021-11-27 MED ORDER — HYDROCODONE-ACETAMINOPHEN 7.5-325 MG PO TABS: 1.0000 | ORAL_TABLET | ORAL | 0 refills | Status: DC | PRN

## 2021-11-29 ENCOUNTER — Encounter: Payer: Self-pay | Admitting: Family Medicine

## 2021-12-01 DIAGNOSIS — J95811 Postprocedural pneumothorax: Secondary | ICD-10-CM | POA: Diagnosis not present

## 2021-12-01 DIAGNOSIS — R0602 Shortness of breath: Secondary | ICD-10-CM | POA: Diagnosis not present

## 2021-12-01 DIAGNOSIS — R0600 Dyspnea, unspecified: Secondary | ICD-10-CM | POA: Diagnosis not present

## 2021-12-01 DIAGNOSIS — J9611 Chronic respiratory failure with hypoxia: Secondary | ICD-10-CM | POA: Diagnosis not present

## 2021-12-13 ENCOUNTER — Encounter: Payer: Self-pay | Admitting: Family Medicine

## 2021-12-13 NOTE — Telephone Encounter (Signed)
Looks like 09/26/21 GI referral was closed.  Plz advise pt what next step is to schedule colonoscopy.

## 2021-12-14 NOTE — Telephone Encounter (Addendum)
Spoke with pt relaying Dr. Synthia Innocent 09/26/21 MyChart message concerning the results of pt's pos iFOB.  Pt then also re read the message stating he totally misunderstood the message.  Pt still agrees to see GI but is upset he can't see an MD vs NP until 03/2022. Also, states he doesn't care for [facility] and prefers referral to [facility] office instead for virtual visit due to recently re-injuring fx hip and wife having regular radiation tx.

## 2021-12-20 ENCOUNTER — Other Ambulatory Visit: Payer: Self-pay | Admitting: *Deleted

## 2021-12-20 MED ORDER — HYDROCODONE-ACETAMINOPHEN 7.5-325 MG PO TABS: 1.0000 | ORAL_TABLET | ORAL | 0 refills | Status: DC | PRN

## 2021-12-22 ENCOUNTER — Encounter: Payer: Self-pay | Admitting: Nurse Practitioner

## 2021-12-25 ENCOUNTER — Telehealth: Payer: Self-pay | Admitting: Family Medicine

## 2021-12-25 NOTE — Telephone Encounter (Signed)
Alex from [facility] called about a  fax that was sent on 11/24/2021 for standard order for oxygen, Call back number [phone].

## 2021-12-25 NOTE — Telephone Encounter (Signed)
Spoke with [facility] notifying them we have not received an order.  I confirmed our fax # and they will resend order today.

## 2021-12-26 DIAGNOSIS — S76012A Strain of muscle, fascia and tendon of left hip, initial encounter: Secondary | ICD-10-CM | POA: Diagnosis not present

## 2021-12-28 NOTE — Telephone Encounter (Signed)
Received faxed O2 order.  Placed form in Dr. Synthia Innocent box.

## 2021-12-29 NOTE — Telephone Encounter (Signed)
Order signed and faxed to [place] [place] at [phone].

## 2021-12-31 DIAGNOSIS — J9611 Chronic respiratory failure with hypoxia: Secondary | ICD-10-CM | POA: Diagnosis not present

## 2021-12-31 DIAGNOSIS — R0602 Shortness of breath: Secondary | ICD-10-CM | POA: Diagnosis not present

## 2021-12-31 DIAGNOSIS — R0600 Dyspnea, unspecified: Secondary | ICD-10-CM | POA: Diagnosis not present

## 2021-12-31 DIAGNOSIS — J95811 Postprocedural pneumothorax: Secondary | ICD-10-CM | POA: Diagnosis not present

## 2022-01-01 ENCOUNTER — Ambulatory Visit
Admission: RE | Admit: 2022-01-01 | Discharge: 2022-01-01 | Disposition: A | Discharge status: Home / Self Care | Payer: Medicare Other | Source: Ambulatory Visit | Attending: Internal Medicine | Admitting: Internal Medicine

## 2022-01-01 DIAGNOSIS — C3411 Malignant neoplasm of upper lobe, right bronchus or lung: Secondary | ICD-10-CM

## 2022-01-01 DIAGNOSIS — C3490 Malignant neoplasm of unspecified part of unspecified bronchus or lung: Secondary | ICD-10-CM | POA: Diagnosis not present

## 2022-01-01 DIAGNOSIS — I700 Atherosclerosis of aorta: Secondary | ICD-10-CM | POA: Diagnosis not present

## 2022-01-02 ENCOUNTER — Inpatient Hospital Stay: Payer: Medicare Other | Attending: Hospice and Palliative Medicine | Admitting: Hospice and Palliative Medicine

## 2022-01-02 ENCOUNTER — Encounter: Payer: Self-pay | Admitting: *Deleted

## 2022-01-02 DIAGNOSIS — G893 Neoplasm related pain (acute) (chronic): Secondary | ICD-10-CM | POA: Diagnosis not present

## 2022-01-02 DIAGNOSIS — C3490 Malignant neoplasm of unspecified part of unspecified bronchus or lung: Secondary | ICD-10-CM | POA: Diagnosis not present

## 2022-01-02 NOTE — Progress Notes (Signed)
Virtual Visit via Telephone Note  I connected with Joseph Graham on 01/02/22 at  1:00 PM EDT by telephone and verified that I am speaking with the correct person using two identifiers.  Location: Patient: home Provider: clinic   I discussed the limitations, risks, security and privacy concerns of performing an evaluation and management service by telephone and the availability of in person appointments. I also discussed with the patient that there may be a patient responsible charge related to this service. The patient expressed understanding and agreed to proceed.   History of Present Illness: [name] is a 81 year old man with multiple medical problems including O2 dependent COPD, CAD, PAD, peripheral neuropathy, and lumbar stenosis with chronic back pain.  Additionally, patient has stage II lung cancer with CT of the chest on 02/24/2020 revealing interval growth and pulmonary nodules.    Patient is status post XRT.  He has had severe chronic back pain limiting treatment and imaging.  He was referred to palliative care to address goals and manage ongoing symptoms.   Observations/Objective: I spoke with patient by phone.    Patient feels like he is not doing well.  He has had worse pain recently after lifting a TV and causing musculoskeletal injury.  He is pending evaluation by home health PT.  Patient says the Norco is helping but he has had to double up on the dose.  He denies any adverse issues with pain medications.  He is concerned about running out early as I just refilled his Norco last week.  Patient denies other symptomatic complaints or concerns.  We did discuss results of his PET scan per his request.  Assessment and Plan: Lung cancer -s/p XRT.  Now on surveillance.  Follow up CT on 01/01/22.  No significant change in posttreatment appearance of right lower lobe.  However, patient is noted to have multiple bilateral pulmonary nodules with some enlargement of existing  nodules in right upper lobe and several new nodules and left upper lobe.  PET/CT was recommended and will be ordered.  Chronic low back pain -continue Norco 7.5-325mg  Q4H PRN and OTC lidocaine patches as needed. PDMP reviewed.   Follow Up Instructions: Follow-up MyChart visit with me in 2-3 months   I discussed the assessment and treatment plan with the patient. The patient was provided an opportunity to ask questions and all were answered. The patient agreed with the plan and demonstrated an understanding of the instructions.   The patient was advised to call back or seek an in-person evaluation if the symptoms worsen or if the condition fails to improve as anticipated.  I provided 10 minutes of non-face-to-face time during this encounter.   Irean Hong, NP

## 2022-01-03 DIAGNOSIS — Z9181 History of falling: Secondary | ICD-10-CM | POA: Diagnosis not present

## 2022-01-03 DIAGNOSIS — Z96652 Presence of left artificial knee joint: Secondary | ICD-10-CM | POA: Diagnosis not present

## 2022-01-03 DIAGNOSIS — Z96642 Presence of left artificial hip joint: Secondary | ICD-10-CM | POA: Diagnosis not present

## 2022-01-03 DIAGNOSIS — Z7952 Long term (current) use of systemic steroids: Secondary | ICD-10-CM | POA: Diagnosis not present

## 2022-01-03 DIAGNOSIS — S76012D Strain of muscle, fascia and tendon of left hip, subsequent encounter: Secondary | ICD-10-CM | POA: Diagnosis not present

## 2022-01-03 DIAGNOSIS — Z7982 Long term (current) use of aspirin: Secondary | ICD-10-CM | POA: Diagnosis not present

## 2022-01-04 ENCOUNTER — Other Ambulatory Visit: Payer: Self-pay | Admitting: Family Medicine

## 2022-01-04 NOTE — Telephone Encounter (Signed)
Message from pt via pharmacy: Your patient is requesting advance approval of refills for this medication to [facility] filled:  01/03/22, #1 ea Last OV:  09/18/21, rehab f/u Next OV:  03/23/22, CPE

## 2022-01-08 ENCOUNTER — Telehealth: Payer: Medicare Other | Admitting: Medical Oncology

## 2022-01-08 ENCOUNTER — Ambulatory Visit: Payer: Medicare Other | Admitting: Radiation Oncology

## 2022-01-08 ENCOUNTER — Telehealth: Payer: Medicare Other | Admitting: Internal Medicine

## 2022-01-08 DIAGNOSIS — Z7982 Long term (current) use of aspirin: Secondary | ICD-10-CM | POA: Diagnosis not present

## 2022-01-08 DIAGNOSIS — S76012D Strain of muscle, fascia and tendon of left hip, subsequent encounter: Secondary | ICD-10-CM | POA: Diagnosis not present

## 2022-01-08 DIAGNOSIS — Z96642 Presence of left artificial hip joint: Secondary | ICD-10-CM | POA: Diagnosis not present

## 2022-01-08 DIAGNOSIS — Z7952 Long term (current) use of systemic steroids: Secondary | ICD-10-CM | POA: Diagnosis not present

## 2022-01-08 DIAGNOSIS — Z9181 History of falling: Secondary | ICD-10-CM | POA: Diagnosis not present

## 2022-01-08 DIAGNOSIS — Z96652 Presence of left artificial knee joint: Secondary | ICD-10-CM | POA: Diagnosis not present

## 2022-01-08 NOTE — Telephone Encounter (Signed)
ERx 

## 2022-01-11 ENCOUNTER — Encounter
Admission: RE | Admit: 2022-01-11 | Discharge: 2022-01-11 | Disposition: A | Discharge status: Home / Self Care | Payer: Medicare Other | Source: Ambulatory Visit | Attending: Hospice and Palliative Medicine | Admitting: Hospice and Palliative Medicine

## 2022-01-11 DIAGNOSIS — C3490 Malignant neoplasm of unspecified part of unspecified bronchus or lung: Secondary | ICD-10-CM | POA: Insufficient documentation

## 2022-01-12 ENCOUNTER — Other Ambulatory Visit: Payer: Self-pay | Admitting: *Deleted

## 2022-01-12 ENCOUNTER — Encounter: Payer: Self-pay | Admitting: *Deleted

## 2022-01-12 MED ORDER — HYDROCODONE-ACETAMINOPHEN 7.5-325 MG PO TABS: 1.0000 | ORAL_TABLET | ORAL | 0 refills | Status: DC | PRN

## 2022-01-15 ENCOUNTER — Encounter: Payer: Self-pay | Admitting: Family Medicine

## 2022-01-16 ENCOUNTER — Telehealth: Payer: Medicare Other | Admitting: Internal Medicine

## 2022-01-16 ENCOUNTER — Ambulatory Visit: Payer: Medicare Other | Admitting: Radiation Oncology

## 2022-01-16 ENCOUNTER — Ambulatory Visit: Payer: Medicare Other | Admitting: Internal Medicine

## 2022-01-22 ENCOUNTER — Ambulatory Visit
Admission: RE | Admit: 2022-01-22 | Discharge: 2022-01-22 | Disposition: A | Discharge status: Home / Self Care | Payer: Medicare Other | Source: Ambulatory Visit | Attending: Hospice and Palliative Medicine | Admitting: Hospice and Palliative Medicine

## 2022-01-22 ENCOUNTER — Ambulatory Visit: Payer: Medicare Other | Admitting: Nurse Practitioner

## 2022-01-22 DIAGNOSIS — C679 Malignant neoplasm of bladder, unspecified: Secondary | ICD-10-CM | POA: Insufficient documentation

## 2022-01-22 DIAGNOSIS — J439 Emphysema, unspecified: Secondary | ICD-10-CM | POA: Insufficient documentation

## 2022-01-22 DIAGNOSIS — I2510 Atherosclerotic heart disease of native coronary artery without angina pectoris: Secondary | ICD-10-CM | POA: Diagnosis not present

## 2022-01-22 DIAGNOSIS — I700 Atherosclerosis of aorta: Secondary | ICD-10-CM | POA: Insufficient documentation

## 2022-01-22 DIAGNOSIS — Z96642 Presence of left artificial hip joint: Secondary | ICD-10-CM | POA: Diagnosis not present

## 2022-01-22 DIAGNOSIS — K8020 Calculus of gallbladder without cholecystitis without obstruction: Secondary | ICD-10-CM | POA: Diagnosis not present

## 2022-01-22 DIAGNOSIS — C3490 Malignant neoplasm of unspecified part of unspecified bronchus or lung: Secondary | ICD-10-CM | POA: Insufficient documentation

## 2022-01-22 DIAGNOSIS — C3402 Malignant neoplasm of left main bronchus: Secondary | ICD-10-CM | POA: Diagnosis not present

## 2022-01-22 DIAGNOSIS — Z9981 Dependence on supplemental oxygen: Secondary | ICD-10-CM | POA: Insufficient documentation

## 2022-01-22 DIAGNOSIS — C301 Malignant neoplasm of middle ear: Secondary | ICD-10-CM | POA: Diagnosis not present

## 2022-01-22 DIAGNOSIS — C3411 Malignant neoplasm of upper lobe, right bronchus or lung: Secondary | ICD-10-CM

## 2022-01-22 LAB — GLUCOSE, CAPILLARY: Glucose-Capillary: 102 mg/dL — ABNORMAL HIGH (ref 70–99)

## 2022-01-22 MED ORDER — FLUDEOXYGLUCOSE F - 18 (FDG) INJECTION
9.0000 | Freq: Once | INTRAVENOUS | Status: AC | PRN
  Administered 2022-01-22: 9 via INTRAVENOUS

## 2022-01-26 ENCOUNTER — Telehealth: Payer: Medicare Other | Admitting: Internal Medicine

## 2022-01-26 ENCOUNTER — Inpatient Hospital Stay (HOSPITAL_BASED_OUTPATIENT_CLINIC_OR_DEPARTMENT_OTHER): Payer: Medicare Other | Admitting: Internal Medicine

## 2022-01-26 ENCOUNTER — Encounter: Payer: Self-pay | Admitting: Internal Medicine

## 2022-01-26 DIAGNOSIS — C3411 Malignant neoplasm of upper lobe, right bronchus or lung: Secondary | ICD-10-CM | POA: Insufficient documentation

## 2022-01-26 NOTE — Progress Notes (Unsigned)
Patient verified using two identifiers for virtual visit via telephone today.  Patient denies new problems/concerns today.   

## 2022-01-26 NOTE — Assessment & Plan Note (Signed)
#   Right UL POORLY DIFF CA- T3 N0  Currently status post radiation [finished feb 2017];  Finished adjuvant chemotherapy [end of April 2017]; August 2023 PET scan STABLE.   # RLL-1.5cm- s/p SBRT [finished Dec 15th, 2021]-CT scan April 2022-extensive radiation changes along the right lower lobe.  August 2023 PET scan STABLE  # AUG 2023- PET scan-approximately 1.5 cm progressive right apical nodule suspicious of progressive disease awaiting reevaluation with Dr. Donella Stade for consideration of SBRT next week.  Patient a poor candidate for any systemic therapy.  Obviously no surgical options evaluated.  #Spontaneous pneumothorax x2 status post pleurodesis-currently clinically-stable   # Peripheral neuropathy/back pain-stable; on hydrocodone/oxycodone- defer to [facility] for pain management. Also continue Neurontin.  Stable.  # COPD on O2/6 lit nasal cannula-stable  # Bladder cancer- superficial- stable  # DISPOSITION: # follow up TBD-Dr.B  # I reviewed the blood work- with the patient in detail; also reviewed the imaging independently [as summarized above]; and with the patient in detail.

## 2022-01-26 NOTE — Progress Notes (Unsigned)
I connected with [name] on 01/26/22 at  3:45 PM EDT by video enabled telemedicine visit and verified that I am speaking with the correct person using two identifiers.  I discussed the limitations, risks, security and privacy concerns of performing an evaluation and management service by telemedicine and the availability of in-person appointments. I also discussed with the patient that there may be a patient responsible charge related to this service. The patient expressed understanding and agreed to proceed.    Other persons participating in the visit and their role in the encounter: RN/medical reconciliation Patient's location: home Provider's location: office  Oncology History Overview Note   # OCT 2016- RUL POORLY DIFF CA [clinically non-small cell] ; STAGE IIB [T3-2 separate nodules in RUL; N0] [s/p CT guided bx]- s/p RT [finished mid jan 2017]; FEB 2017-CT scan- regression of RUL nodules;   # March 2017- Start carbo-taxol q 3 w x4; June 2017- CT- Improvement of RUL nodule/ Stable RUL nodules. OCT 2017- PR   # OCT 2016- 2 sub cm nodules in Right LL- Feb CT 2017- STABLE; June 2017- resolved  # May- 2017- G2 PN  # OCT 2016-NON-invasive bladder urothelial cancer; high grade [Dr. [name]; s/p TURBT [Feb 2017- NEG]; declined BCG  #   # Pneumothorax [oct 2016 s/p CT Bx];  # MOLECULAR STUDIES- PDL-1 by IHC/keytruda- 11-20%.  -----------------------------------------    DIAGNOSIS: RUL Lung cancer  STAGE:   II ; GOALS: control  CURRENT/MOST RECENT THERAPY: Surveillance      Malignant neoplasm of right upper lobe of lung ([place])  12/20/2015 Initial Diagnosis   Malignant neoplasm of right upper lobe of lung ([place])      Chief Complaint: Lung cancer  History of present illness:Joseph Graham 81 y.o.  male with history of advanced COPD history of spontaneous pneumothorax and also recurrent local lung cancer-s/p SBRT also-currently on surveillance is here for  follow-up.  Patient declined to get the PET scan done given the length of the test/difficulty lying on his back because of pain.   Patient has chronic shortness of breath not any worse.  He continues to be on 6 L of oxygen.  Denies any recent hospitalizations.  Denies any headaches.  Denies any nausea vomiting.  Patient has chronic back pain/chronic neuropathy-needed to be on narcotic pain medication.  Observation/objective: Alert & oriented x 3. In No acute distress.   Assessment and plan: No problem-specific Assessment & Plan notes found for this encounter.  Follow-up instructions:  I discussed the assessment and treatment plan with the patient.  The patient was provided an opportunity to ask questions and all were answered.  The patient agreed with the plan and demonstrated understanding of instructions.  The patient was advised to call back or seek an in person evaluation if the symptoms worsen or if the condition fails to improve as anticipated.   Dr. Charlaine Dalton [place] at [facility] 01/26/2022 3:22 PM

## 2022-01-30 ENCOUNTER — Ambulatory Visit
Admission: RE | Admit: 2022-01-30 | Discharge: 2022-01-30 | Disposition: A | Discharge status: Home / Self Care | Payer: Medicare Other | Source: Ambulatory Visit | Attending: Radiation Oncology | Admitting: Radiation Oncology

## 2022-01-30 DIAGNOSIS — C3481 Malignant neoplasm of overlapping sites of right bronchus and lung: Secondary | ICD-10-CM

## 2022-01-31 ENCOUNTER — Other Ambulatory Visit: Payer: Self-pay | Admitting: *Deleted

## 2022-01-31 DIAGNOSIS — J9611 Chronic respiratory failure with hypoxia: Secondary | ICD-10-CM | POA: Diagnosis not present

## 2022-01-31 DIAGNOSIS — R918 Other nonspecific abnormal finding of lung field: Secondary | ICD-10-CM

## 2022-01-31 DIAGNOSIS — R0600 Dyspnea, unspecified: Secondary | ICD-10-CM | POA: Diagnosis not present

## 2022-01-31 DIAGNOSIS — R0602 Shortness of breath: Secondary | ICD-10-CM | POA: Diagnosis not present

## 2022-01-31 DIAGNOSIS — J95811 Postprocedural pneumothorax: Secondary | ICD-10-CM | POA: Diagnosis not present

## 2022-01-31 MED ORDER — HYDROCODONE-ACETAMINOPHEN 7.5-325 MG PO TABS: 1.0000 | ORAL_TABLET | ORAL | 0 refills | Status: DC | PRN

## 2022-01-31 NOTE — Progress Notes (Signed)
Radiation Oncology Follow up Note  Name: Joseph Graham   Date:   01/30/2022 MRN:  [id number] DOB: Apr 02, 1941   Radiation Oncology TeleHEALTH VISIT PROGRESS NOTE  I connected with      [name]  by telephone-Webex and verified that I am speaking with the correct person using two identifiers.  I discussed the limitations, risks, security and privacy concerns of performing an evaluation and management service by telemedicine and the availability of in-person appointments. I also discussed with the patient that there may be a patient responsible charge related to this service. The patient expressed understanding and agreed to proceed.    Other persons participating in the visit and their role in the encounter:    Patient's location: Home   Provider's location: work  This 81 y.o. male for video conference today in follow-up year and half out from SBRT to his right lower lobe and patient previously treated to his right upper lobe for poorly differentiated carcinoma.  REFERRING PROVIDER: [name], MD  HPI: Patient is an 81 year old male now a year and a half after completed SBRT to his right lower lobe for non-small cell lung cancer and patient previously treated to his right upper lobe for poorly differentiated non-small cell lung cancer.  I established video voice communication with the patient.  His lung function is poor poor he is wheelchair-bound specifically Nuys cough hemoptysis or chest tightness.  CT scan back in July showed no significant change in posttreatment appearance of the right lower lobe.  He also had unchanged posttreatment appearance the posterior right apex.  He had multiple bilateral pulm nodules at least 1 slightly enlarged compared to prior visit.  PET CT scan done this month Showed a hypermetabolic right apical nodule suspicious for another primary lesion.  He did have some right-sided pleural hypermetabolic activity likely related to prior prior  pleurodesis. COMPLICATIONS OF TREATMENT: none  FOLLOW UP COMPLIANCE: keeps appointments   PHYSICAL EXAM:  There were no vitals taken for this visit. Exam was not performed based on this being a video visit  RADIOLOGY RESULTS: CT scans and PET CT scans reviewed compatible with above-stated findings  PLAN: Present time I am not too concerned about this hypermetabolic apical nodule.  His small really no specific CT correlate for it.  I like to repeat his CT scan in 3 to 4 months and 6 and have another video conference at that time.  Should it be progressive at that time would offer another SBRT course of treatment.  Patient comprehends my recommendations well.  I would like to take this opportunity to thank you for allowing me to participate in the care of your patient.Noreene Filbert, MD

## 2022-02-05 DIAGNOSIS — S76012A Strain of muscle, fascia and tendon of left hip, initial encounter: Secondary | ICD-10-CM | POA: Diagnosis not present

## 2022-02-05 DIAGNOSIS — Z8781 Personal history of (healed) traumatic fracture: Secondary | ICD-10-CM | POA: Diagnosis not present

## 2022-02-08 ENCOUNTER — Telehealth: Payer: Self-pay | Admitting: Internal Medicine

## 2022-02-08 ENCOUNTER — Other Ambulatory Visit: Payer: Medicare Other

## 2022-02-08 NOTE — Progress Notes (Signed)
Tumor Board Documentation  Joseph Graham was presented by Dr Rogue Bussing at our Tumor Board on 02/08/2022, which included representatives from medical oncology, surgical, pharmacy, pulmonology, radiology, genetics, pathology, radiation oncology, navigation, research, internal medicine, palliative care.  Joseph Graham currently presents as a current patient, for discussion with history of the following treatments: neoadjuvant chemoradiation, surgical intervention(s), active survellience.  Additionally, we reviewed previous medical and familial history, history of present illness, and recent lab results along with all available histopathologic and imaging studies. The tumor board considered available treatment options and made the following recommendations: Radiation therapy (primary modality) Refer to Dr Baruch Gouty  The following procedures/referrals were also placed: No orders of the defined types were placed in this encounter.   Clinical Trial Status: not discussed   Staging used: AJCC Stage Group AJCC Staging:       Group: Stage IIB RUL Lung Cancer   National site-specific guidelines NCCN were discussed with respect to the case.  Tumor board is a meeting of clinicians from various specialty areas who evaluate and discuss patients for whom a multidisciplinary approach is being considered. Final determinations in the plan of care are those of the provider(s). The responsibility for follow up of recommendations given during tumor board is that of the provider.   Today's extended care, comprehensive team conference, Joseph Graham was not present for the discussion and was not examined.   Multidisciplinary Tumor Board is a multidisciplinary case peer review process.  Decisions discussed in the Multidisciplinary Tumor Board reflect the opinions of the specialists present at the conference without having examined the patient.  Ultimately, treatment and diagnostic decisions rest with the primary provider(s)  and the patient.

## 2022-02-08 NOTE — Telephone Encounter (Signed)
Discussed imaging at the tumor conference.  Dr. Donella Stade recommends re: SBRT.  His office will follow-up.  Hayley- please follow up.   Thanks GB

## 2022-02-13 ENCOUNTER — Institutional Professional Consult (permissible substitution): Payer: Medicare Other | Admitting: Radiation Oncology

## 2022-02-13 DIAGNOSIS — C3431 Malignant neoplasm of lower lobe, right bronchus or lung: Secondary | ICD-10-CM | POA: Diagnosis not present

## 2022-02-15 ENCOUNTER — Ambulatory Visit
Admission: RE | Admit: 2022-02-15 | Discharge: 2022-02-15 | Disposition: A | Discharge status: Home / Self Care | Payer: Medicare Other | Source: Ambulatory Visit | Attending: Radiation Oncology | Admitting: Radiation Oncology

## 2022-02-15 ENCOUNTER — Ambulatory Visit: Payer: Medicare Other | Admitting: Radiation Oncology

## 2022-02-15 ENCOUNTER — Encounter: Payer: Self-pay | Admitting: Radiation Oncology

## 2022-02-15 VITALS — BP 153/55 | HR 60 | Temp 99.0°F | Resp 20 | Ht 68.0 in | Wt 165.0 lb

## 2022-02-15 DIAGNOSIS — R531 Weakness: Secondary | ICD-10-CM | POA: Insufficient documentation

## 2022-02-15 DIAGNOSIS — R918 Other nonspecific abnormal finding of lung field: Secondary | ICD-10-CM | POA: Diagnosis not present

## 2022-02-15 DIAGNOSIS — R042 Hemoptysis: Secondary | ICD-10-CM | POA: Insufficient documentation

## 2022-02-15 DIAGNOSIS — R059 Cough, unspecified: Secondary | ICD-10-CM | POA: Diagnosis not present

## 2022-02-15 DIAGNOSIS — Z993 Dependence on wheelchair: Secondary | ICD-10-CM | POA: Diagnosis not present

## 2022-02-15 DIAGNOSIS — Z87891 Personal history of nicotine dependence: Secondary | ICD-10-CM | POA: Diagnosis not present

## 2022-02-15 DIAGNOSIS — C3411 Malignant neoplasm of upper lobe, right bronchus or lung: Secondary | ICD-10-CM | POA: Diagnosis not present

## 2022-02-15 DIAGNOSIS — Z510 Encounter for antineoplastic radiation therapy: Secondary | ICD-10-CM | POA: Insufficient documentation

## 2022-02-15 DIAGNOSIS — Z9981 Dependence on supplemental oxygen: Secondary | ICD-10-CM | POA: Diagnosis not present

## 2022-02-15 DIAGNOSIS — C3431 Malignant neoplasm of lower lobe, right bronchus or lung: Secondary | ICD-10-CM | POA: Diagnosis not present

## 2022-02-15 NOTE — Progress Notes (Signed)
Radiation Oncology Follow up Note old patient new area right upper lobe lung cancer  Name: Joseph Graham   Date:   02/15/2022 MRN:  [id number] DOB: [birth year]    This 81 y.o. male presents to the clinic today for reconsultation and PET positive area adjacent to previous SBRT treatment in the right upper lobe.  REFERRING PROVIDER: [name], MD  HPI: Patient is a.  81 year old male oxygen dependent wheelchair-bound who is previously treated with SBRT to his right lung for non-small cell lung cancer poorly differentiated carcinoma.  We have been tracking him with repeat scans his most recent PET scan showed a hypermetabolic right apical nodule posterior to radiation previous treatment field suspicious for metachronous primary or metastatic disease.  He also has right-sided pleural hypermetabolic hyperattenuation likely related to prior pleurodesis.  He is doing clinically fairly well.  He states he is fairly weak he specifically Nuys cough hemoptysis.  He was discussed with the medical oncology team and recommendation was for repeat SBRT to this right apical lesion PET positive.  COMPLICATIONS OF TREATMENT: none  FOLLOW UP COMPLIANCE: keeps appointments   PHYSICAL EXAM:  BP (!) 153/55   Pulse 60   Temp 99 F (37.2 C) (Tympanic)   Resp 20   Ht 5\' 8"  (1.727 m) Comment: stated ht  Wt 165 lb (74.8 kg) Comment: stated wt  BMI 25.09 kg/m  Frail-appearing oxygen-dependent male wheelchair-bound in NAD.  Does have stasis changes in his lower extremities bilaterally.  Well-developed well-nourished patient in NAD. HEENT reveals PERLA, EOMI, discs not visualized.  Oral cavity is clear. No oral mucosal lesions are identified. Neck is clear without evidence of cervical or supraclavicular adenopathy. Lungs are clear to A&P. Cardiac examination is essentially unremarkable with regular rate and rhythm without murmur rub or thrill. Abdomen is benign with no organomegaly or masses noted. Motor sensory  and DTR levels are equal and symmetric in the upper and lower extremities. Cranial nerves II through XII are grossly intact. Proprioception is intact. No peripheral adenopathy or edema is identified. No motor or sensory levels are noted. Crude visual fields are within normal range.  RADIOLOGY RESULTS: Serial CT scans and PET CT scans reviewed compatible with above-stated findings showing new lesion in the right apical lung which is PET positive.  PLAN: This time elected ahead with SBRT to his right apical new nodule.  We will plan on delivering 50 Gray in 5 fractions using SBRT.  Risks and benefits of treatment including extremely low side effect profile was discussed with the patient.  He comprehends her recommendations well.  Based on his problems with travel we are simulating him today and will start his treatments the next week.  I would like to take this opportunity to thank you for allowing me to participate in the care of your patient.Noreene Filbert, MD

## 2022-02-19 ENCOUNTER — Telehealth: Payer: Self-pay | Admitting: Family Medicine

## 2022-02-19 NOTE — Telephone Encounter (Signed)
Received faxed order and placed in Dr. Synthia Innocent box.

## 2022-02-19 NOTE — Telephone Encounter (Signed)
Joseph Graham from [employer] called in regards for oxygen recertification. She stated it was faxed over on August 28th and will be re-faxed today. Thank you!

## 2022-02-20 DIAGNOSIS — Z510 Encounter for antineoplastic radiation therapy: Secondary | ICD-10-CM | POA: Diagnosis not present

## 2022-02-20 DIAGNOSIS — Z9981 Dependence on supplemental oxygen: Secondary | ICD-10-CM | POA: Diagnosis not present

## 2022-02-20 DIAGNOSIS — R918 Other nonspecific abnormal finding of lung field: Secondary | ICD-10-CM | POA: Diagnosis not present

## 2022-02-20 DIAGNOSIS — R531 Weakness: Secondary | ICD-10-CM | POA: Diagnosis not present

## 2022-02-20 DIAGNOSIS — Z993 Dependence on wheelchair: Secondary | ICD-10-CM | POA: Diagnosis not present

## 2022-02-20 DIAGNOSIS — C3431 Malignant neoplasm of lower lobe, right bronchus or lung: Secondary | ICD-10-CM | POA: Diagnosis not present

## 2022-02-20 DIAGNOSIS — R059 Cough, unspecified: Secondary | ICD-10-CM | POA: Diagnosis not present

## 2022-02-20 DIAGNOSIS — Z87891 Personal history of nicotine dependence: Secondary | ICD-10-CM | POA: Diagnosis not present

## 2022-02-20 DIAGNOSIS — R042 Hemoptysis: Secondary | ICD-10-CM | POA: Diagnosis not present

## 2022-02-22 ENCOUNTER — Telehealth: Payer: Self-pay | Admitting: *Deleted

## 2022-02-22 ENCOUNTER — Inpatient Hospital Stay: Payer: Medicare Other | Attending: Hospice and Palliative Medicine | Admitting: Hospice and Palliative Medicine

## 2022-02-22 DIAGNOSIS — G894 Chronic pain syndrome: Secondary | ICD-10-CM

## 2022-02-22 DIAGNOSIS — Z515 Encounter for palliative care: Secondary | ICD-10-CM

## 2022-02-22 DIAGNOSIS — C3411 Malignant neoplasm of upper lobe, right bronchus or lung: Secondary | ICD-10-CM

## 2022-02-22 MED ORDER — OXYCODONE HCL 10 MG PO TABS: 10.0000 mg | ORAL_TABLET | ORAL | 0 refills | Status: AC | PRN

## 2022-02-22 NOTE — Telephone Encounter (Signed)
Pt called to say that he is having lots of pain in back. He was told that he can double the hydrocodone  and pain he Is on pain patch. He has 5 more radiations treatment. He talked to [facility] borders last time and I spoke with Merrily Pew and we put a telephone encounter on for pt. Merrily Pew will call and determine what to do to help with pain

## 2022-02-22 NOTE — Telephone Encounter (Signed)
Patient called and said that he is miserable with pain in his back. The last time he spoke to [facility]. He has 5 more radiation visits and his back is severe pain. He is on pain patch and was told that he could doubled the hydrocodone and he wants to see if there is anything else e can take to alleviate some of this pain. I have spoke to [facility] about this and he will call pt back

## 2022-02-22 NOTE — Progress Notes (Signed)
Virtual Visit via Telephone Note  I connected with Joseph Graham on 02/22/22 at 11:10 AM EDT by telephone and verified that I am speaking with the correct person using two identifiers.  Location: Patient: home Provider: clinic   I discussed the limitations, risks, security and privacy concerns of performing an evaluation and management service by telephone and the availability of in person appointments. I also discussed with the patient that there may be a patient responsible charge related to this service. The patient expressed understanding and agreed to proceed.   History of Present Illness: Joseph Graham is a 81 year old man with multiple medical problems including O2 dependent COPD, CAD, PAD, peripheral neuropathy, and lumbar stenosis with chronic back pain.  Additionally, patient has stage II lung cancer with CT of the chest on 02/24/2020 revealing interval growth and pulmonary nodules.    Patient is status post XRT.  He has had severe chronic back pain limiting treatment and imaging.  He was referred to palliative care to address goals and manage ongoing symptoms.   Observations/Objective: I spoke with patient by phone.    Patient is receiving SBRT reports that his back pain is severe and limiting his ability to stay still during radiation treatments.  He has been taking two Norco tablets (7.5-325mg ) chronically but has not found that to be particularly helpful during radiation.  He is requesting stronger pain medications.  He is using a Lidoderm patch with mild improvement.  No adverse effects from pain medications reported.  Patient has previously declined retrial of long-acting opioids.  Assessment and Plan: Lung cancer -PET scan in August 2023 showed hypermetabolic right apical nodule suspicious of progressive disease.  Patient is considered a poor candidate for systemic therapy.  Patient is receiving SBRT.  Chronic low back pain -worse since starting SBRT. Will DC Norco and  start oxycodone 10 mg every 4 hours as needed #60  Follow Up Instructions: Follow-up MyChart visit with me in 1 months   I discussed the assessment and treatment plan with the patient. The patient was provided an opportunity to ask questions and all were answered. The patient agreed with the plan and demonstrated an understanding of the instructions.   The patient was advised to call back or seek an in-person evaluation if the symptoms worsen or if the condition fails to improve as anticipated.  I provided 10 minutes of non-face-to-face time during this encounter.   Irean Hong, NP

## 2022-02-26 ENCOUNTER — Other Ambulatory Visit: Payer: Self-pay

## 2022-02-26 ENCOUNTER — Ambulatory Visit
Admission: RE | Admit: 2022-02-26 | Discharge: 2022-02-26 | Disposition: A | Discharge status: Home / Self Care | Payer: Medicare Other | Source: Ambulatory Visit | Attending: Radiation Oncology | Admitting: Radiation Oncology

## 2022-02-26 DIAGNOSIS — R918 Other nonspecific abnormal finding of lung field: Secondary | ICD-10-CM | POA: Diagnosis not present

## 2022-02-26 DIAGNOSIS — R531 Weakness: Secondary | ICD-10-CM | POA: Diagnosis not present

## 2022-02-26 DIAGNOSIS — Z993 Dependence on wheelchair: Secondary | ICD-10-CM | POA: Diagnosis not present

## 2022-02-26 DIAGNOSIS — Z510 Encounter for antineoplastic radiation therapy: Secondary | ICD-10-CM | POA: Diagnosis not present

## 2022-02-26 DIAGNOSIS — Z87891 Personal history of nicotine dependence: Secondary | ICD-10-CM | POA: Diagnosis not present

## 2022-02-26 DIAGNOSIS — R059 Cough, unspecified: Secondary | ICD-10-CM | POA: Diagnosis not present

## 2022-02-26 DIAGNOSIS — Z9981 Dependence on supplemental oxygen: Secondary | ICD-10-CM | POA: Diagnosis not present

## 2022-02-26 DIAGNOSIS — R042 Hemoptysis: Secondary | ICD-10-CM | POA: Diagnosis not present

## 2022-02-26 LAB — RAD ONC ARIA SESSION SUMMARY
Course Elapsed Days: 0
Plan Fractions Treated to Date: 1
Plan Prescribed Dose Per Fraction: 10 Gy
Plan Total Fractions Prescribed: 5
Plan Total Prescribed Dose: 50 Gy
Reference Point Dosage Given to Date: 10 Gy
Reference Point Session Dosage Given: 10 Gy
Session Number: 1

## 2022-02-26 NOTE — Progress Notes (Signed)
Radiation Oncology Follow up Note  Name: Joseph Graham   Date:   02/15/2022 MRN:  [id number] DOB: 18-Apr-1941    This note is an addendum to my reconsultation fromSeptember 7, 2023. As per insurance company request the tumor size is now 1.2 cm. Per the patient's O2 dependency COPD CAD PAD peripheral neuropathy he has been deemed a nonsurgical candidate. On PET CT scan there isevidence of mediastinal adenopathy.

## 2022-02-28 ENCOUNTER — Other Ambulatory Visit: Payer: Self-pay

## 2022-02-28 ENCOUNTER — Ambulatory Visit
Admission: RE | Admit: 2022-02-28 | Discharge: 2022-02-28 | Disposition: A | Discharge status: Home / Self Care | Payer: Medicare Other | Source: Ambulatory Visit | Attending: Radiation Oncology | Admitting: Radiation Oncology

## 2022-02-28 DIAGNOSIS — R531 Weakness: Secondary | ICD-10-CM | POA: Diagnosis not present

## 2022-02-28 DIAGNOSIS — R059 Cough, unspecified: Secondary | ICD-10-CM | POA: Diagnosis not present

## 2022-02-28 DIAGNOSIS — R918 Other nonspecific abnormal finding of lung field: Secondary | ICD-10-CM | POA: Diagnosis not present

## 2022-02-28 DIAGNOSIS — Z510 Encounter for antineoplastic radiation therapy: Secondary | ICD-10-CM | POA: Diagnosis not present

## 2022-02-28 DIAGNOSIS — Z993 Dependence on wheelchair: Secondary | ICD-10-CM | POA: Diagnosis not present

## 2022-02-28 DIAGNOSIS — Z87891 Personal history of nicotine dependence: Secondary | ICD-10-CM | POA: Diagnosis not present

## 2022-02-28 DIAGNOSIS — Z9981 Dependence on supplemental oxygen: Secondary | ICD-10-CM | POA: Diagnosis not present

## 2022-02-28 DIAGNOSIS — R042 Hemoptysis: Secondary | ICD-10-CM | POA: Diagnosis not present

## 2022-02-28 LAB — RAD ONC ARIA SESSION SUMMARY
Course Elapsed Days: 2
Plan Fractions Treated to Date: 2
Plan Prescribed Dose Per Fraction: 10 Gy
Plan Total Fractions Prescribed: 5
Plan Total Prescribed Dose: 50 Gy
Reference Point Dosage Given to Date: 20 Gy
Reference Point Session Dosage Given: 10 Gy
Session Number: 2

## 2022-03-03 DIAGNOSIS — R0600 Dyspnea, unspecified: Secondary | ICD-10-CM | POA: Diagnosis not present

## 2022-03-03 DIAGNOSIS — R0602 Shortness of breath: Secondary | ICD-10-CM | POA: Diagnosis not present

## 2022-03-03 DIAGNOSIS — J95811 Postprocedural pneumothorax: Secondary | ICD-10-CM | POA: Diagnosis not present

## 2022-03-03 DIAGNOSIS — J9611 Chronic respiratory failure with hypoxia: Secondary | ICD-10-CM | POA: Diagnosis not present

## 2022-03-05 ENCOUNTER — Ambulatory Visit
Admission: RE | Admit: 2022-03-05 | Discharge: 2022-03-05 | Disposition: A | Discharge status: Home / Self Care | Payer: Medicare Other | Source: Ambulatory Visit | Attending: Radiation Oncology | Admitting: Radiation Oncology

## 2022-03-05 ENCOUNTER — Other Ambulatory Visit: Payer: Self-pay

## 2022-03-05 DIAGNOSIS — Z87891 Personal history of nicotine dependence: Secondary | ICD-10-CM | POA: Diagnosis not present

## 2022-03-05 DIAGNOSIS — R042 Hemoptysis: Secondary | ICD-10-CM | POA: Diagnosis not present

## 2022-03-05 DIAGNOSIS — R918 Other nonspecific abnormal finding of lung field: Secondary | ICD-10-CM | POA: Diagnosis not present

## 2022-03-05 DIAGNOSIS — Z9981 Dependence on supplemental oxygen: Secondary | ICD-10-CM | POA: Diagnosis not present

## 2022-03-05 DIAGNOSIS — R059 Cough, unspecified: Secondary | ICD-10-CM | POA: Diagnosis not present

## 2022-03-05 DIAGNOSIS — Z993 Dependence on wheelchair: Secondary | ICD-10-CM | POA: Diagnosis not present

## 2022-03-05 DIAGNOSIS — Z510 Encounter for antineoplastic radiation therapy: Secondary | ICD-10-CM | POA: Diagnosis not present

## 2022-03-05 DIAGNOSIS — R531 Weakness: Secondary | ICD-10-CM | POA: Diagnosis not present

## 2022-03-05 LAB — RAD ONC ARIA SESSION SUMMARY
Course Elapsed Days: 7
Plan Fractions Treated to Date: 3
Plan Prescribed Dose Per Fraction: 10 Gy
Plan Total Fractions Prescribed: 5
Plan Total Prescribed Dose: 50 Gy
Reference Point Dosage Given to Date: 30 Gy
Reference Point Session Dosage Given: 10 Gy
Session Number: 3

## 2022-03-07 ENCOUNTER — Other Ambulatory Visit: Payer: Self-pay

## 2022-03-07 ENCOUNTER — Ambulatory Visit
Admission: RE | Admit: 2022-03-07 | Discharge: 2022-03-07 | Disposition: A | Discharge status: Home / Self Care | Payer: Medicare Other | Source: Ambulatory Visit | Attending: Radiation Oncology | Admitting: Radiation Oncology

## 2022-03-07 DIAGNOSIS — R059 Cough, unspecified: Secondary | ICD-10-CM | POA: Diagnosis not present

## 2022-03-07 DIAGNOSIS — Z87891 Personal history of nicotine dependence: Secondary | ICD-10-CM | POA: Diagnosis not present

## 2022-03-07 DIAGNOSIS — Z510 Encounter for antineoplastic radiation therapy: Secondary | ICD-10-CM | POA: Diagnosis not present

## 2022-03-07 DIAGNOSIS — R531 Weakness: Secondary | ICD-10-CM | POA: Diagnosis not present

## 2022-03-07 DIAGNOSIS — Z9981 Dependence on supplemental oxygen: Secondary | ICD-10-CM | POA: Diagnosis not present

## 2022-03-07 DIAGNOSIS — Z993 Dependence on wheelchair: Secondary | ICD-10-CM | POA: Diagnosis not present

## 2022-03-07 DIAGNOSIS — R918 Other nonspecific abnormal finding of lung field: Secondary | ICD-10-CM | POA: Diagnosis not present

## 2022-03-07 DIAGNOSIS — C3431 Malignant neoplasm of lower lobe, right bronchus or lung: Secondary | ICD-10-CM | POA: Diagnosis not present

## 2022-03-07 DIAGNOSIS — R042 Hemoptysis: Secondary | ICD-10-CM | POA: Diagnosis not present

## 2022-03-07 LAB — RAD ONC ARIA SESSION SUMMARY
Course Elapsed Days: 9
Plan Fractions Treated to Date: 4
Plan Prescribed Dose Per Fraction: 10 Gy
Plan Total Fractions Prescribed: 5
Plan Total Prescribed Dose: 50 Gy
Reference Point Dosage Given to Date: 40 Gy
Reference Point Session Dosage Given: 10 Gy
Session Number: 4

## 2022-03-12 ENCOUNTER — Ambulatory Visit: Payer: Medicare Other

## 2022-03-13 ENCOUNTER — Telehealth: Payer: Self-pay | Admitting: Family Medicine

## 2022-03-13 NOTE — Telephone Encounter (Addendum)
Thank you. Filled out death certificate.  SSN is blank - they will need to fill this out before I can certify.

## 2022-03-13 NOTE — Telephone Encounter (Addendum)
I spoke with wife to express my condolences. He passed away in his sleep early sunday AM.   Plz notify funeral home that his death certificate has his last name misspelled (they have it as [place]). plz fix and then I will fill out.

## 2022-03-13 NOTE — Telephone Encounter (Signed)
[facility] called in stated they need PCP signature for cremation . Would like a call back # [phone] case# [id number]

## 2022-03-13 NOTE — Telephone Encounter (Signed)
Spoke with Jeannie Done (?) relaying Dr. Synthia Innocent message.  Verbalizes understanding and will notify Velva Harman.

## 2022-03-13 NOTE — Telephone Encounter (Signed)
Spoke with Joseph Graham relaying Dr. Synthia Innocent message.  States she just went in a few mins ago and corrected pt last name.

## 2022-03-14 NOTE — Telephone Encounter (Signed)
error 

## 2022-03-19 ENCOUNTER — Other Ambulatory Visit: Payer: Medicare Other

## 2022-03-19 ENCOUNTER — Telehealth: Payer: Medicare Other

## 2022-03-21 ENCOUNTER — Telehealth: Payer: Medicare Other | Admitting: Hospice and Palliative Medicine

## 2022-03-23 ENCOUNTER — Encounter: Payer: Medicare Other | Admitting: Family Medicine

## 2022-04-11 DEATH — deceased

## 2022-04-23 ENCOUNTER — Ambulatory Visit: Payer: Medicare Other | Admitting: Radiation Oncology

## 2022-04-30 ENCOUNTER — Other Ambulatory Visit: Payer: Medicare Other

## 2022-05-07 ENCOUNTER — Telehealth: Payer: Medicare Other | Admitting: Radiation Oncology
